# Patient Record
Sex: Female | Born: 1965
Health system: Southern US, Community
[De-identification: ages and names within clinical notes are randomized; demographics above are authoritative.]

## PROBLEM LIST (undated history)

## (undated) DIAGNOSIS — E559 Vitamin D deficiency, unspecified: Secondary | ICD-10-CM

## (undated) DIAGNOSIS — N809 Endometriosis, unspecified: Secondary | ICD-10-CM

## (undated) DIAGNOSIS — G4730 Sleep apnea, unspecified: Secondary | ICD-10-CM

## (undated) DIAGNOSIS — M549 Dorsalgia, unspecified: Secondary | ICD-10-CM

## (undated) DIAGNOSIS — Z86010 Personal history of colonic polyps: Secondary | ICD-10-CM

## (undated) DIAGNOSIS — K219 Gastro-esophageal reflux disease without esophagitis: Secondary | ICD-10-CM

## (undated) DIAGNOSIS — M7989 Other specified soft tissue disorders: Secondary | ICD-10-CM

## (undated) DIAGNOSIS — G8929 Other chronic pain: Secondary | ICD-10-CM

## (undated) DIAGNOSIS — E785 Hyperlipidemia, unspecified: Secondary | ICD-10-CM

## (undated) DIAGNOSIS — K829 Disease of gallbladder, unspecified: Secondary | ICD-10-CM

## (undated) DIAGNOSIS — G4700 Insomnia, unspecified: Secondary | ICD-10-CM

## (undated) DIAGNOSIS — C801 Malignant (primary) neoplasm, unspecified: Secondary | ICD-10-CM

## (undated) DIAGNOSIS — E669 Obesity, unspecified: Secondary | ICD-10-CM

## (undated) DIAGNOSIS — J309 Allergic rhinitis, unspecified: Secondary | ICD-10-CM

## (undated) DIAGNOSIS — J40 Bronchitis, not specified as acute or chronic: Secondary | ICD-10-CM

## (undated) DIAGNOSIS — M797 Fibromyalgia: Secondary | ICD-10-CM

## (undated) DIAGNOSIS — E039 Hypothyroidism, unspecified: Secondary | ICD-10-CM

## (undated) DIAGNOSIS — E538 Deficiency of other specified B group vitamins: Secondary | ICD-10-CM

## (undated) DIAGNOSIS — M2550 Pain in unspecified joint: Secondary | ICD-10-CM

## (undated) DIAGNOSIS — F32A Depression, unspecified: Secondary | ICD-10-CM

## (undated) DIAGNOSIS — Z800 Family history of malignant neoplasm of digestive organs: Secondary | ICD-10-CM

## (undated) DIAGNOSIS — D649 Anemia, unspecified: Secondary | ICD-10-CM

## (undated) DIAGNOSIS — K589 Irritable bowel syndrome without diarrhea: Secondary | ICD-10-CM

## (undated) DIAGNOSIS — F419 Anxiety disorder, unspecified: Secondary | ICD-10-CM

## (undated) DIAGNOSIS — G2581 Restless legs syndrome: Secondary | ICD-10-CM

## (undated) DIAGNOSIS — M1990 Unspecified osteoarthritis, unspecified site: Secondary | ICD-10-CM

## (undated) DIAGNOSIS — E611 Iron deficiency: Secondary | ICD-10-CM

## (undated) DIAGNOSIS — R112 Nausea with vomiting, unspecified: Secondary | ICD-10-CM

## (undated) DIAGNOSIS — R5382 Chronic fatigue, unspecified: Secondary | ICD-10-CM

## (undated) DIAGNOSIS — R609 Edema, unspecified: Secondary | ICD-10-CM

## (undated) DIAGNOSIS — R51 Headache: Secondary | ICD-10-CM

## (undated) DIAGNOSIS — T7840XA Allergy, unspecified, initial encounter: Secondary | ICD-10-CM

## (undated) DIAGNOSIS — K8020 Calculus of gallbladder without cholecystitis without obstruction: Secondary | ICD-10-CM

## (undated) DIAGNOSIS — R519 Headache, unspecified: Secondary | ICD-10-CM

## (undated) DIAGNOSIS — K5900 Constipation, unspecified: Secondary | ICD-10-CM

## (undated) DIAGNOSIS — F329 Major depressive disorder, single episode, unspecified: Secondary | ICD-10-CM

## (undated) DIAGNOSIS — R0602 Shortness of breath: Secondary | ICD-10-CM

## (undated) DIAGNOSIS — Z9889 Other specified postprocedural states: Secondary | ICD-10-CM

## (undated) HISTORY — DX: Endometriosis, unspecified: N80.9

## (undated) HISTORY — DX: Major depressive disorder, single episode, unspecified: F32.9

## (undated) HISTORY — DX: Gastro-esophageal reflux disease without esophagitis: K21.9

## (undated) HISTORY — DX: Depression, unspecified: F32.A

## (undated) HISTORY — DX: Allergic rhinitis, unspecified: J30.9

## (undated) HISTORY — DX: Iron deficiency: E61.1

## (undated) HISTORY — DX: Edema, unspecified: R60.9

## (undated) HISTORY — PX: PANNICULECTOMY: SUR1001

## (undated) HISTORY — DX: Vitamin D deficiency, unspecified: E55.9

## (undated) HISTORY — DX: Other chronic pain: G89.29

## (undated) HISTORY — DX: Irritable bowel syndrome, unspecified: K58.9

## (undated) HISTORY — DX: Obesity, unspecified: E66.9

## (undated) HISTORY — DX: Personal history of colonic polyps: Z86.010

## (undated) HISTORY — DX: Restless legs syndrome: G25.81

## (undated) HISTORY — DX: Headache: R51

## (undated) HISTORY — DX: Allergy, unspecified, initial encounter: T78.40XA

## (undated) HISTORY — DX: Calculus of gallbladder without cholecystitis without obstruction: K80.20

## (undated) HISTORY — DX: Anxiety disorder, unspecified: F41.9

## (undated) HISTORY — DX: Malignant (primary) neoplasm, unspecified: C80.1

## (undated) HISTORY — DX: Constipation, unspecified: K59.00

## (undated) HISTORY — DX: Dorsalgia, unspecified: M54.9

## (undated) HISTORY — DX: Chronic fatigue, unspecified: R53.82

## (undated) HISTORY — DX: Insomnia, unspecified: G47.00

## (undated) HISTORY — PX: DE QUERVAIN'S RELEASE: SHX1439

## (undated) HISTORY — DX: Deficiency of other specified B group vitamins: E53.8

## (undated) HISTORY — DX: Unspecified osteoarthritis, unspecified site: M19.90

## (undated) HISTORY — DX: Pain in unspecified joint: M25.50

## (undated) HISTORY — PX: TRIGGER FINGER RELEASE: SHX641

## (undated) HISTORY — DX: Bronchitis, not specified as acute or chronic: J40

## (undated) HISTORY — DX: Shortness of breath: R06.02

## (undated) HISTORY — DX: Disease of gallbladder, unspecified: K82.9

## (undated) HISTORY — DX: Hyperlipidemia, unspecified: E78.5

## (undated) HISTORY — DX: Family history of malignant neoplasm of digestive organs: Z80.0

## (undated) HISTORY — DX: Headache, unspecified: R51.9

## (undated) HISTORY — PX: LUMBAR LAMINECTOMY: SHX95

## (undated) HISTORY — DX: Sleep apnea, unspecified: G47.30

## (undated) HISTORY — DX: Other specified soft tissue disorders: M79.89

## (undated) HISTORY — DX: Fibromyalgia: M79.7

## (undated) HISTORY — PX: POLYPECTOMY: SHX149

## (undated) HISTORY — DX: Hypothyroidism, unspecified: E03.9

## (undated) HISTORY — DX: Anemia, unspecified: D64.9

---
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
## 1991-12-03 HISTORY — PX: APPENDECTOMY: SHX54

## 1997-12-02 HISTORY — PX: CHOLECYSTECTOMY: SHX55

## 1998-02-07 ENCOUNTER — Ambulatory Visit (HOSPITAL_COMMUNITY): Admission: RE | Admit: 1998-02-07 | Discharge: 1998-02-07 | Payer: Self-pay | Admitting: Obstetrics and Gynecology

## 1998-06-08 ENCOUNTER — Observation Stay (HOSPITAL_COMMUNITY): Admission: RE | Admit: 1998-06-08 | Discharge: 1998-06-09 | Payer: Self-pay | Admitting: Surgery

## 1999-06-14 ENCOUNTER — Other Ambulatory Visit: Admission: RE | Admit: 1999-06-14 | Discharge: 1999-06-14 | Payer: Self-pay | Admitting: Obstetrics and Gynecology

## 1999-12-03 HISTORY — PX: REPAIR ANKLE LIGAMENT: SUR1187

## 2000-06-17 ENCOUNTER — Other Ambulatory Visit: Admission: RE | Admit: 2000-06-17 | Discharge: 2000-06-17 | Payer: Self-pay | Admitting: Obstetrics and Gynecology

## 2000-12-10 ENCOUNTER — Encounter: Payer: Self-pay | Admitting: Orthopedic Surgery

## 2000-12-10 ENCOUNTER — Ambulatory Visit (HOSPITAL_COMMUNITY): Admission: RE | Admit: 2000-12-10 | Discharge: 2000-12-10 | Payer: Self-pay | Admitting: Orthopedic Surgery

## 2001-02-03 ENCOUNTER — Ambulatory Visit (HOSPITAL_BASED_OUTPATIENT_CLINIC_OR_DEPARTMENT_OTHER): Admission: RE | Admit: 2001-02-03 | Discharge: 2001-02-04 | Payer: Self-pay | Admitting: Orthopedic Surgery

## 2001-02-13 ENCOUNTER — Emergency Department (HOSPITAL_COMMUNITY): Admission: EM | Admit: 2001-02-13 | Discharge: 2001-02-13 | Payer: Self-pay | Admitting: Emergency Medicine

## 2001-06-04 ENCOUNTER — Ambulatory Visit (HOSPITAL_COMMUNITY): Admission: RE | Admit: 2001-06-04 | Discharge: 2001-06-04 | Payer: Self-pay | Admitting: Endocrinology

## 2001-06-04 ENCOUNTER — Encounter: Payer: Self-pay | Admitting: Endocrinology

## 2001-06-05 ENCOUNTER — Encounter: Payer: Self-pay | Admitting: Endocrinology

## 2001-06-05 ENCOUNTER — Ambulatory Visit (HOSPITAL_COMMUNITY): Admission: RE | Admit: 2001-06-05 | Discharge: 2001-06-05 | Payer: Self-pay | Admitting: Endocrinology

## 2001-06-19 ENCOUNTER — Encounter: Admission: RE | Admit: 2001-06-19 | Discharge: 2001-07-01 | Payer: Self-pay | Admitting: Internal Medicine

## 2001-06-30 ENCOUNTER — Encounter: Admission: RE | Admit: 2001-06-30 | Discharge: 2001-07-01 | Payer: Self-pay | Admitting: Neurological Surgery

## 2001-07-02 ENCOUNTER — Encounter: Admission: RE | Admit: 2001-07-02 | Discharge: 2001-09-30 | Payer: Self-pay | Admitting: Neurological Surgery

## 2001-07-04 ENCOUNTER — Other Ambulatory Visit: Admission: RE | Admit: 2001-07-04 | Discharge: 2001-07-04 | Payer: Self-pay | Admitting: Obstetrics and Gynecology

## 2001-07-24 ENCOUNTER — Encounter: Payer: Self-pay | Admitting: Neurological Surgery

## 2001-07-24 ENCOUNTER — Encounter: Admission: RE | Admit: 2001-07-24 | Discharge: 2001-07-24 | Payer: Self-pay | Admitting: Neurological Surgery

## 2001-09-17 ENCOUNTER — Emergency Department (HOSPITAL_COMMUNITY): Admission: EM | Admit: 2001-09-17 | Discharge: 2001-09-17 | Payer: Self-pay | Admitting: Emergency Medicine

## 2001-09-21 ENCOUNTER — Ambulatory Visit (HOSPITAL_COMMUNITY): Admission: RE | Admit: 2001-09-21 | Discharge: 2001-09-21 | Payer: Self-pay | Admitting: Internal Medicine

## 2001-09-21 ENCOUNTER — Encounter: Payer: Self-pay | Admitting: Internal Medicine

## 2001-09-25 ENCOUNTER — Encounter: Payer: Self-pay | Admitting: Neurological Surgery

## 2001-09-25 ENCOUNTER — Ambulatory Visit (HOSPITAL_COMMUNITY): Admission: RE | Admit: 2001-09-25 | Discharge: 2001-09-25 | Payer: Self-pay | Admitting: Neurological Surgery

## 2001-10-02 ENCOUNTER — Ambulatory Visit (HOSPITAL_BASED_OUTPATIENT_CLINIC_OR_DEPARTMENT_OTHER): Admission: RE | Admit: 2001-10-02 | Discharge: 2001-10-02 | Payer: Self-pay | Admitting: Pulmonary Disease

## 2001-10-07 ENCOUNTER — Ambulatory Visit (HOSPITAL_COMMUNITY): Admission: RE | Admit: 2001-10-07 | Discharge: 2001-10-07 | Payer: Self-pay | Admitting: Internal Medicine

## 2001-12-02 HISTORY — PX: ANTERIOR CRUCIATE LIGAMENT REPAIR: SHX115

## 2002-04-12 ENCOUNTER — Emergency Department (HOSPITAL_COMMUNITY): Admission: EM | Admit: 2002-04-12 | Discharge: 2002-04-12 | Payer: Self-pay | Admitting: Emergency Medicine

## 2002-04-12 ENCOUNTER — Encounter: Payer: Self-pay | Admitting: Emergency Medicine

## 2002-09-24 ENCOUNTER — Ambulatory Visit (HOSPITAL_COMMUNITY): Admission: RE | Admit: 2002-09-24 | Discharge: 2002-09-24 | Payer: Self-pay | Admitting: Neurological Surgery

## 2002-09-24 ENCOUNTER — Encounter: Payer: Self-pay | Admitting: Neurological Surgery

## 2002-09-27 ENCOUNTER — Other Ambulatory Visit: Admission: RE | Admit: 2002-09-27 | Discharge: 2002-09-27 | Payer: Self-pay | Admitting: Obstetrics and Gynecology

## 2002-11-02 ENCOUNTER — Ambulatory Visit (HOSPITAL_COMMUNITY): Admission: RE | Admit: 2002-11-02 | Discharge: 2002-11-02 | Payer: Self-pay | Admitting: Orthopedic Surgery

## 2002-11-02 ENCOUNTER — Encounter: Payer: Self-pay | Admitting: Orthopedic Surgery

## 2002-11-11 ENCOUNTER — Ambulatory Visit (HOSPITAL_BASED_OUTPATIENT_CLINIC_OR_DEPARTMENT_OTHER): Admission: RE | Admit: 2002-11-11 | Discharge: 2002-11-11 | Payer: Self-pay | Admitting: Orthopedic Surgery

## 2002-11-29 ENCOUNTER — Encounter: Admission: RE | Admit: 2002-11-29 | Discharge: 2003-02-27 | Payer: Self-pay | Admitting: Orthopedic Surgery

## 2003-01-31 ENCOUNTER — Other Ambulatory Visit (HOSPITAL_COMMUNITY): Admission: RE | Admit: 2003-01-31 | Discharge: 2003-02-14 | Payer: Self-pay | Admitting: Psychiatry

## 2003-10-25 ENCOUNTER — Other Ambulatory Visit: Admission: RE | Admit: 2003-10-25 | Discharge: 2003-10-25 | Payer: Self-pay | Admitting: Obstetrics and Gynecology

## 2004-01-17 ENCOUNTER — Ambulatory Visit (HOSPITAL_COMMUNITY): Admission: RE | Admit: 2004-01-17 | Discharge: 2004-01-17 | Payer: Self-pay | Admitting: Surgery

## 2004-01-17 ENCOUNTER — Encounter: Admission: RE | Admit: 2004-01-17 | Discharge: 2004-04-16 | Payer: Self-pay | Admitting: Surgery

## 2004-01-17 ENCOUNTER — Encounter: Admission: RE | Admit: 2004-01-17 | Discharge: 2004-01-17 | Payer: Self-pay | Admitting: Surgery

## 2004-01-23 ENCOUNTER — Encounter: Admission: RE | Admit: 2004-01-23 | Discharge: 2004-01-23 | Payer: Self-pay | Admitting: Surgery

## 2004-04-01 HISTORY — PX: ROUX-EN-Y GASTRIC BYPASS: SHX1104

## 2004-04-03 ENCOUNTER — Inpatient Hospital Stay (HOSPITAL_COMMUNITY): Admission: RE | Admit: 2004-04-03 | Discharge: 2004-04-10 | Payer: Self-pay | Admitting: Surgery

## 2004-05-23 ENCOUNTER — Encounter: Admission: RE | Admit: 2004-05-23 | Discharge: 2004-08-21 | Payer: Self-pay | Admitting: Surgery

## 2004-09-07 ENCOUNTER — Encounter: Admission: RE | Admit: 2004-09-07 | Discharge: 2004-09-07 | Payer: Self-pay | Admitting: Obstetrics & Gynecology

## 2004-10-30 ENCOUNTER — Encounter: Admission: RE | Admit: 2004-10-30 | Discharge: 2004-10-30 | Payer: Self-pay | Admitting: Surgery

## 2004-12-13 ENCOUNTER — Ambulatory Visit (HOSPITAL_COMMUNITY): Admission: RE | Admit: 2004-12-13 | Discharge: 2004-12-13 | Payer: Self-pay | Admitting: Surgery

## 2005-01-29 ENCOUNTER — Encounter: Admission: RE | Admit: 2005-01-29 | Discharge: 2005-04-29 | Payer: Self-pay | Admitting: Surgery

## 2006-01-10 ENCOUNTER — Ambulatory Visit: Payer: Self-pay | Admitting: Gastroenterology

## 2006-02-06 ENCOUNTER — Ambulatory Visit: Payer: Self-pay | Admitting: Gastroenterology

## 2006-02-12 ENCOUNTER — Ambulatory Visit: Payer: Self-pay | Admitting: Gastroenterology

## 2006-04-01 DIAGNOSIS — Z860100 Personal history of colon polyps, unspecified: Secondary | ICD-10-CM

## 2006-04-01 HISTORY — DX: Personal history of colon polyps, unspecified: Z86.0100

## 2006-04-01 HISTORY — DX: Personal history of colonic polyps: Z86.010

## 2007-12-03 HISTORY — PX: COLONOSCOPY: SHX174

## 2010-12-23 ENCOUNTER — Encounter: Payer: Self-pay | Admitting: Surgery

## 2011-04-24 ENCOUNTER — Encounter: Payer: Self-pay | Admitting: Gastroenterology

## 2011-06-10 ENCOUNTER — Encounter: Payer: Self-pay | Admitting: Gastroenterology

## 2011-06-10 ENCOUNTER — Ambulatory Visit (INDEPENDENT_AMBULATORY_CARE_PROVIDER_SITE_OTHER): Payer: Medicare HMO | Admitting: Gastroenterology

## 2011-06-10 DIAGNOSIS — Z860100 Personal history of colon polyps, unspecified: Secondary | ICD-10-CM | POA: Insufficient documentation

## 2011-06-10 DIAGNOSIS — Z9884 Bariatric surgery status: Secondary | ICD-10-CM

## 2011-06-10 DIAGNOSIS — L0390 Cellulitis, unspecified: Secondary | ICD-10-CM

## 2011-06-10 DIAGNOSIS — Z8739 Personal history of other diseases of the musculoskeletal system and connective tissue: Secondary | ICD-10-CM

## 2011-06-10 DIAGNOSIS — F3341 Major depressive disorder, recurrent, in partial remission: Secondary | ICD-10-CM | POA: Insufficient documentation

## 2011-06-10 DIAGNOSIS — K219 Gastro-esophageal reflux disease without esophagitis: Secondary | ICD-10-CM

## 2011-06-10 DIAGNOSIS — Z800 Family history of malignant neoplasm of digestive organs: Secondary | ICD-10-CM

## 2011-06-10 DIAGNOSIS — E538 Deficiency of other specified B group vitamins: Secondary | ICD-10-CM | POA: Insufficient documentation

## 2011-06-10 DIAGNOSIS — L0291 Cutaneous abscess, unspecified: Secondary | ICD-10-CM

## 2011-06-10 DIAGNOSIS — IMO0002 Reserved for concepts with insufficient information to code with codable children: Secondary | ICD-10-CM

## 2011-06-10 DIAGNOSIS — E669 Obesity, unspecified: Secondary | ICD-10-CM

## 2011-06-10 MED ORDER — DEXLANSOPRAZOLE 60 MG PO CPDR: 60.0000 mg | DELAYED_RELEASE_CAPSULE | Freq: Every day | ORAL | Status: AC

## 2011-06-10 MED ORDER — CEFUROXIME AXETIL 500 MG PO TABS: 500.0000 mg | ORAL_TABLET | Freq: Two times a day (BID) | ORAL | Status: AC

## 2011-06-10 MED ORDER — PEG-KCL-NACL-NASULF-NA ASC-C 100 G PO SOLR: 1.0000 | Freq: Once | ORAL | Status: DC

## 2011-06-10 NOTE — Patient Instructions (Addendum)
Your procedure has been scheduled for 06/11/2011, please follow the seperate instructions.  Your prescription(s) have been sent to you pharmacy.  We have changed your reflux medication to Dexilant per our computer this is the least expensive medication on your insurance.

## 2011-06-10 NOTE — Progress Notes (Signed)
This is a 45 year old Caucasian female status post gastric bypass with Roux-en-Y biliary diversion. She initially lost 60 pounds in weight but has gained all of her weight back since 2005. She suffers from chronic GERD and is on daily PPI therapy. She recently fell and traumatized her back and right leg. She has a strong family history of colon cancer in her mother at age 20 and several aunts and uncles. Last colonoscopy and endoscopy was 5 years ago. Has chronic depression, fibromyalgia, and is chronically disabled. He is on multiple psychotropic medications, when necessary hydrocodone, B12 shots, and a variety of multivitamins. She recently has had pain and edema in her right leg after a traumatic injury, but denies any cardiopulmonary or systemic complaints.  Current Medications, Allergies, Past Medical History, Past Surgical History, Family History and Social History were reviewed in [employer] record.  Pertinent Review of Systems Negative.. she relates her depression is under control as is her fibromyalgia. [facility] and apparently had negative chest x-ray and spine films.   Physical Exam: Moderately obese female appears stated age in no acute distress. I cannot appreciate stigmata of chronic liver disease. There is no thyromegaly or lymphadenopathy noted. Chest is clear cardiac exam is unremarkable without murmurs gallops or rubs. She is in a regular rhythm. There is no organomegaly, abdominal masses or localized tenderness. Bowel sounds are normal. Peripheral extremities show edema and swelling of her right leg with superficial cellulitis and abrasions associated erythema/ superficial pustules. There is no calf tenderness or Homan;s sign. Mental status is normal there are no gross focal neurological deficits.   Assessment and Plan: She appears to have superficial cellulitis associated with a recent leg trauma. I placed her on Ceftin  500 mg twice a day for one week with Florstar probiotic therapy. Colonoscopy has been scheduled her convenience. She is to continue PPI therapy, multivitamins, and B12 shots as per primary care. I did think she is a good candidate for propofol sedation with her multiple psychotropic medications. She relates she has had recent lab data performed with various metabolic studies, all of which have been normal.      Please copy her primary care physician, referring physician, and pertinent subspecialists. Encounter Diagnoses  Name Primary?  . Family history of malignant neoplasm of gastrointestinal tract   . Personal history of colonic polyps   . Esophageal reflux   . Cellulitis

## 2011-06-11 ENCOUNTER — Ambulatory Visit (AMBULATORY_SURGERY_CENTER): Payer: Medicare HMO | Admitting: Gastroenterology

## 2011-06-11 ENCOUNTER — Encounter: Payer: Self-pay | Admitting: Gastroenterology

## 2011-06-11 VITALS — BP 113/66 | HR 75 | Temp 98.7°F | Resp 15 | Ht 69.0 in | Wt 297.0 lb

## 2011-06-11 DIAGNOSIS — Z800 Family history of malignant neoplasm of digestive organs: Secondary | ICD-10-CM

## 2011-06-11 DIAGNOSIS — D126 Benign neoplasm of colon, unspecified: Secondary | ICD-10-CM

## 2011-06-11 DIAGNOSIS — Z1211 Encounter for screening for malignant neoplasm of colon: Secondary | ICD-10-CM

## 2011-06-11 MED ORDER — SODIUM CHLORIDE 0.9 % IV SOLN: 500.0000 mL | INTRAVENOUS | Status: DC

## 2011-06-11 NOTE — Progress Notes (Signed)
Pt with significant bruise on back and right lower leg. States she [place] does [place] tolerate blanket touching. Procedure room and recovery room nurses made aware.

## 2011-06-11 NOTE — Patient Instructions (Signed)
Read the handouts given to you by your recovery room nurse.   You may resume all of your routine medications today.  Dr. Jarold Motto has added an antibiotic to heal your [place]   Please, take all of the medication.   Let your wounds air dry per Dr. Jarold Motto.   Your biopsy results from your polyps will be mailed to you within two weeks.  If you have any questions or concerns, please call us at [phone]. Thank-you.

## 2011-06-12 ENCOUNTER — Telehealth: Payer: Self-pay

## 2011-06-12 NOTE — Telephone Encounter (Signed)

## 2011-06-13 ENCOUNTER — Telehealth: Payer: Self-pay | Admitting: Gastroenterology

## 2011-06-13 NOTE — Telephone Encounter (Signed)
Pt aware of Dr. Patterson's recommendations. 

## 2011-06-13 NOTE — Telephone Encounter (Signed)
Spoke with pt and she is aware of Dr. Norval Gable recommendation to go to the ER to have legs evaluated. See previous phone note.

## 2011-06-13 NOTE — Telephone Encounter (Signed)
SHOULD PROBABLY GO TO ER.Marland KitchenMarland KitchenMarland Kitchen

## 2011-06-13 NOTE — Telephone Encounter (Signed)
Pt states she was seen here Monday and was given Ceftin for cellulitis in her legs. She reports that she now has no feeling in the dark spots on her legs, she states her legs are very tender and there are dark blisters present. Her PCP states that she should be on high dose Cipro or Augmentin and needs a referral to a Careers adviser. Pt is calling wanting to get Dr. Norval Gable recommendation. Dr. Jarold Motto please advise.

## 2011-06-17 ENCOUNTER — Encounter: Payer: Self-pay | Admitting: Gastroenterology

## 2011-09-24 ENCOUNTER — Telehealth: Payer: Self-pay | Admitting: Gastroenterology

## 2011-09-25 NOTE — Telephone Encounter (Signed)
Pt last seen 06/10/11 with hx of Post Gastric Bypass, Chronic GERD, Chronic Depression, Fibromyalgia, chronically Disabled. On this visit pt was dx with Cellulitis in her extremity and she was given Ceftin and scheduled for a COLON the next day. Colon revealed polyps identified as tubular adenomas neg. for hi grade dysplasia or malignancy and another that was hyperplastic. The next day on 06/13/11, pt called to report her legs had no feeling and she had dark spots and blisters on her legs. Per Dr Jarold Motto, she was instructed to go to the ER. I have not other info on pt; she lives in [place], so I assume she went to the ER there. On 09/24/11, pt called to c/o a bad case of IBS per the report. LMOM for pt to call back.  Pt reports she had a bout of constipation last week and since Monday , 09/23/11, and since then she has had loose, watery stools. This is a new problem for her; she denies antibiotic use recently. She called her PCP who ordered a CBC, BMET and she will submit a stool sample, but she doesn't know for what. Pt advised to use Imodium after her stool is collected and call us with the stools results. Pt stated understanding.

## 2011-10-02 ENCOUNTER — Telehealth: Payer: Self-pay | Admitting: Gastroenterology

## 2011-10-02 NOTE — Telephone Encounter (Signed)
Pt reports she has a positive stool culture at Dr [facility] in [place]. Called her ofc, [phone] and they will fax the ofc note and culture result when she signs off on it; she is out of the ofc today. Informed pt of this and she stated she was placed on a Zpack for 3 days.

## 2011-10-02 NOTE — Telephone Encounter (Signed)
Dr Jarold Motto, I'm putting the stool culture report for Campylobacter Species in your folder in IN BOX. Pt reports Dr Sudie Bailey gave her 3 days of Azithromax. Please advise.

## 2012-07-09 DIAGNOSIS — M549 Dorsalgia, unspecified: Secondary | ICD-10-CM | POA: Insufficient documentation

## 2012-08-20 ENCOUNTER — Encounter: Payer: Self-pay | Admitting: Gastroenterology

## 2013-02-22 DIAGNOSIS — M7918 Myalgia, other site: Secondary | ICD-10-CM | POA: Insufficient documentation

## 2015-01-18 DIAGNOSIS — M47814 Spondylosis without myelopathy or radiculopathy, thoracic region: Secondary | ICD-10-CM | POA: Insufficient documentation

## 2015-01-18 DIAGNOSIS — M47899 Other spondylosis, site unspecified: Secondary | ICD-10-CM | POA: Insufficient documentation

## 2015-01-18 DIAGNOSIS — M47819 Spondylosis without myelopathy or radiculopathy, site unspecified: Secondary | ICD-10-CM

## 2015-01-18 DIAGNOSIS — M5136 Other intervertebral disc degeneration, lumbar region: Secondary | ICD-10-CM | POA: Insufficient documentation

## 2015-04-24 ENCOUNTER — Encounter: Payer: Self-pay | Admitting: Gastroenterology

## 2015-04-28 ENCOUNTER — Ambulatory Visit: Payer: Medicare HMO | Admitting: Physician Assistant

## 2015-05-09 ENCOUNTER — Encounter: Payer: Self-pay | Admitting: Physician Assistant

## 2015-05-09 ENCOUNTER — Other Ambulatory Visit (INDEPENDENT_AMBULATORY_CARE_PROVIDER_SITE_OTHER): Payer: PPO

## 2015-05-09 ENCOUNTER — Ambulatory Visit (INDEPENDENT_AMBULATORY_CARE_PROVIDER_SITE_OTHER): Payer: PPO | Admitting: Physician Assistant

## 2015-05-09 VITALS — BP 112/76 | HR 84 | Ht 68.0 in | Wt 252.2 lb

## 2015-05-09 DIAGNOSIS — R195 Other fecal abnormalities: Secondary | ICD-10-CM

## 2015-05-09 DIAGNOSIS — D500 Iron deficiency anemia secondary to blood loss (chronic): Secondary | ICD-10-CM

## 2015-05-09 DIAGNOSIS — Z86010 Personal history of colonic polyps: Secondary | ICD-10-CM

## 2015-05-09 DIAGNOSIS — E538 Deficiency of other specified B group vitamins: Secondary | ICD-10-CM

## 2015-05-09 LAB — CBC WITH DIFFERENTIAL/PLATELET
Basophils Absolute: 0 10*3/uL (ref 0.0–0.1)
Basophils Relative: 0.7 % (ref 0.0–3.0)
Eosinophils Absolute: 0.1 10*3/uL (ref 0.0–0.7)
Eosinophils Relative: 1.2 % (ref 0.0–5.0)
HCT: 36.7 % (ref 36.0–46.0)
Hemoglobin: 11.9 g/dL — ABNORMAL LOW (ref 12.0–15.0)
Lymphocytes Relative: 42.8 % (ref 12.0–46.0)
Lymphs Abs: 2.8 10*3/uL (ref 0.7–4.0)
MCHC: 32.5 g/dL (ref 30.0–36.0)
MCV: 86.6 fl (ref 78.0–100.0)
MONO ABS: 0.5 10*3/uL (ref 0.1–1.0)
MONOS PCT: 7.4 % (ref 3.0–12.0)
Neutro Abs: 3.2 10*3/uL (ref 1.4–7.7)
Neutrophils Relative %: 47.9 % (ref 43.0–77.0)
Platelets: 227 10*3/uL (ref 150.0–400.0)
RBC: 4.24 Mil/uL (ref 3.87–5.11)
RDW: 14.5 % (ref 11.5–15.5)
WBC: 6.6 10*3/uL (ref 4.0–10.5)

## 2015-05-09 LAB — IBC PANEL
IRON: 45 ug/dL (ref 42–145)
Saturation Ratios: 8.4 % — ABNORMAL LOW (ref 20.0–50.0)
TRANSFERRIN: 383 mg/dL — AB (ref 212.0–360.0)

## 2015-05-09 LAB — FERRITIN: FERRITIN: 3 ng/mL — AB (ref 10.0–291.0)

## 2015-05-09 MED ORDER — MOVIPREP 100 G PO SOLR: 1.0000 | ORAL | Status: DC

## 2015-05-09 NOTE — Progress Notes (Signed)
i agree with the above note, plan 

## 2015-05-09 NOTE — Patient Instructions (Signed)
Please go to the basement level to have your labs drawn.  We sent a prescription for the colonoscopy prep to [facility].  You have been scheduled for an endoscopy and colonoscopy. Please follow the written instructions given to you at your visit today. Please pick up your prep supplies at the pharmacy within the next 1-3 days. If you use inhalers (even only as needed), please bring them with you on the day of your procedure. Your physician has requested that you go to www.startemmi.com and enter the access code given to you at your visit today. This web site gives a general overview about your procedure. However, you should still follow specific instructions given to you by our office regarding your preparation for the procedure.

## 2015-05-09 NOTE — Progress Notes (Signed)
Patient ID: Paige [name], female   DOB: 17-Nov-1966, 49 y.o.   MRN: [id number]   Subjective:    Patient ID: Paige [name], female    DOB: 02-06-1966, 49 y.o.   MRN: [id number]  HPI Paige [name] is a very nice 49 year old white female known to Dr. Sharlett Iles from colonoscopy done in 2012. This was done for family history of colon cancer in her mother. She was found to have multiple small polyps in the left colon which were removed. One was a tubular adenoma and 1 hyperplastic polyp. She was to have 5 year interval follow-up. She is referred today by Dr. Baird Cancer for a new anemia and Hemoccult-positive stool. She has no prior history of anemia that she is aware of. She has not noticed any melena or hematochezia. She has noted some mid abdominal discomfort off and on over the past several months which she describes as a crampy bloated-type feeling. She says she has had some problems with bloating ever since she had gastric bypass surgery in 2005 which was a Roux-en-Y. No nausea or vomiting. She has had significant problems with constipation over the past 3-4 months as well and has been using lactulose when necessary. She says she tried MiraLAX but did not find it beneficial initially. She also has history of chronic GERD currently is on Prilosec every morning and ranitidine at bedtime and says this regimen seems to work fairly well. Other medical problems include fibromyalgia, obesity, and major depression. She relates that her mother was diagnosed with colon cancer she believes in her 31s she also hasn't answered and uncles on the maternal side with colon cancers. Labs from 04/24/2015 hemoglobin 10.8 hematocrit of 34.2 MCV of 87 iron studies were not done  Review of Systems Pertinent positive and negative review of systems were noted in the above HPI section.  All other review of systems was otherwise negative.  Outpatient Encounter Prescriptions as of 05/09/2015  Medication Sig  . ARIPiprazole  (ABILIFY) 15 MG tablet Take 7.5 mg by mouth at bedtime.  . baclofen (LIORESAL) 10 MG tablet Take 10 mg by mouth at bedtime.  . Biotin 5000 MCG TABS Take 1 tablet by mouth every morning.  Marland Kitchen buPROPion (WELLBUTRIN SR) 150 MG 12 hr tablet Take 150 mg by mouth 2 (two) times daily.  . clonazePAM (KLONOPIN) 1 MG tablet Take 1-2 mg by mouth at bedtime as needed.   Marland Kitchen DHEA 25 MG CAPS Take 1 capsule by mouth every morning.  . DULoxetine (CYMBALTA) 60 MG capsule Take 60 mg by mouth 2 (two) times daily.   . furosemide (LASIX) 40 MG tablet Take 40 mg by mouth as needed for edema.  Marland Kitchen HYDROcodone-acetaminophen (NORCO) 10-325 MG per tablet Take 1 tablet by mouth every 8 (eight) hours as needed.  [name] 1/20 1-20 MG-MCG per tablet Take 1 tablet by mouth daily.   Marland Kitchen lactulose (CHRONULAC) 10 GM/15ML solution Take 10 g by mouth daily.  Marland Kitchen levothyroxine (SYNTHROID, LEVOTHROID) 88 MCG tablet Take 88 mcg by mouth daily before breakfast.  . Magnesium Cl-Calcium Carbonate (SLOW-MAG PO) Take 2 tablets by mouth at bedtime.  . montelukast (SINGULAIR) 10 MG tablet Take 10 mg by mouth at bedtime.  Marland Kitchen omeprazole (PRILOSEC) 40 MG capsule Take 40 mg by mouth daily.  . ondansetron (ZOFRAN-ODT) 4 MG disintegrating tablet Take 4 mg by mouth as needed.   . promethazine (PHENERGAN) 25 MG tablet Take 25 mg by mouth as needed. Migraines  . ranitidine (ZANTAC) 300 MG  capsule Take 300 mg by mouth every evening.  . traZODone (DESYREL) 150 MG tablet Take 225 mg by mouth at bedtime.   Marland Kitchen MOVIPREP 100 G SOLR Take 1 kit (200 g total) by mouth as directed.  . [DISCONTINUED] ARIPiprazole (ABILIFY) 2 MG tablet Take 2 mg by mouth daily.    . [DISCONTINUED] aspirin 81 MG tablet Take 81 mg by mouth daily.    . [DISCONTINUED] B Complex-Biotin-FA (HM VITAMIN B50 COMPLEX PO) Take 1 capsule by mouth daily.    . [DISCONTINUED] Cholecalciferol (VITAMIN D-3) 5000 UNITS TABS Take 1 tablet by mouth daily.    . [DISCONTINUED] CYANOCOBALAMIN IJ Inject  1,000 mcg as directed every 21 ( twenty-one) days.    . [DISCONTINUED] HYDROcodone-acetaminophen (NORCO) 5-325 MG per tablet   . [DISCONTINUED] indomethacin (INDOCIN) 50 MG capsule   . [DISCONTINUED] levothyroxine (SYNTHROID, LEVOTHROID) 50 MCG tablet   . [DISCONTINUED] Magnesium Malate POWD by Does not apply route.    . [DISCONTINUED] Omega-3 Fatty Acids (OMEGA 3 PO) Take 1,000 mg by mouth 2 (two) times daily.    . [DISCONTINUED] pantoprazole (PROTONIX) 40 MG tablet    Facility-Administered Encounter Medications as of 05/09/2015  Medication  . 0.9 %  sodium chloride infusion   Allergies  Allergen Reactions  . Flexeril [Cyclobenzaprine Hcl]   . Imitrex [Sumatriptan Base]   . Morphine And Related   . Percocet [Oxycodone-Acetaminophen]    Patient Active Problem List   Diagnosis Date Noted  . Family history of malignant neoplasm of gastrointestinal tract 06/10/2011  . Personal history of colonic polyps 06/10/2011  . Esophageal reflux 06/10/2011  . Cellulitis 06/10/2011  . Status post gastric bypass for obesity 06/10/2011  . B12 nutritional deficiency 06/10/2011  . Obesity 06/10/2011  . Personal history of fibromyalgia 06/10/2011  . Depression, major, recurrent, in partial remission 06/10/2011   History   Social History  . Marital Status: Single    Spouse Name: N/A  . Number of Children: 0  . Years of Education: N/A   Occupational History  . RN/disabled    Social History Main Topics  . Smoking status: Never Smoker   . Smokeless tobacco: Never Used  . Alcohol Use: 0.0 oz/week    0 Standard drinks or equivalent per week     Comment: rarely  . Drug Use: No  . Sexual Activity: Not on file   Other Topics Concern  . Not on file   Social History Narrative    Ms. Rylander's family history includes Colon cancer in her mother; Colon polyps in her sister; Diabetes in her father and sister; Heart disease in her father; Leukemia in her father; Lymphoma in her father; Skin cancer  in her father.      Objective:    Filed Vitals:   05/09/15 0934  BP: 112/76  Pulse: 84    Physical Exam   well-developed white female in no acute distress, pleasant blood pressure 112/76 pulse 84 height 5 foot 8 weight 252, BMI 38.3. HEENT; nontraumatic normocephalic EOMI PERRLA sclera anicteric, Supple ;no JVD, Cardiovascular; regular rate and rhythm with S1-S2 no murmur or gallop, Pulmonary ;clear bilaterally, Abdomen; obese soft bowel sounds are present she has some mild tenderness in the right lower quadrant with slight fullness no definite mass or hepatosplenomegaly, Rectal ;exam not done recently documented Hemoccult-positive, extremities no clubbing cyanosis or edema skin warm and dry, Psych; mood and affect normal and appropriate       Assessment & Plan:   #1 49 yo  female with  New anemia, and heme positive stool with c/o intermittent mid abdominal discomfort. Etiology not clear, r/o occult colon lesion , gastritis , anastomotic ulceration #2 s/p gastric bypass 2005- Roux -en -y #3 hx of adenomatous polyps 2012 #4 family hx of Colon cancer- mother #5 Obesity #6Fibromyalgia #7 chronic GERD  Plan; Check repeat cbc, Fe  Studies  Schedule for Colonoscopy and EGD with Dr.Jacobs- procedures discussed in detail with pt and she is agreeable to proceed  Continue Omeprazole 40 mg po qam and Zantac 150 qpm   Nicolus Ose S Dickie Labarre PA-C 05/09/2015   Cc: Ernestene Kiel, MD

## 2015-05-10 MED ORDER — POLYSACCHARIDE IRON COMPLEX 150 MG PO CAPS: 150.0000 mg | ORAL_CAPSULE | Freq: Every day | ORAL | Status: DC

## 2015-05-24 ENCOUNTER — Encounter: Payer: Self-pay | Admitting: Gastroenterology

## 2015-05-24 ENCOUNTER — Ambulatory Visit (AMBULATORY_SURGERY_CENTER): Payer: PPO | Admitting: Gastroenterology

## 2015-05-24 VITALS — BP 109/66 | HR 70 | Temp 98.1°F | Resp 14 | Ht 68.0 in | Wt 252.0 lb

## 2015-05-24 DIAGNOSIS — K2990 Gastroduodenitis, unspecified, without bleeding: Secondary | ICD-10-CM

## 2015-05-24 DIAGNOSIS — Z800 Family history of malignant neoplasm of digestive organs: Secondary | ICD-10-CM | POA: Diagnosis not present

## 2015-05-24 DIAGNOSIS — K2950 Unspecified chronic gastritis without bleeding: Secondary | ICD-10-CM | POA: Diagnosis not present

## 2015-05-24 DIAGNOSIS — K2970 Gastritis, unspecified, without bleeding: Secondary | ICD-10-CM

## 2015-05-24 DIAGNOSIS — D500 Iron deficiency anemia secondary to blood loss (chronic): Secondary | ICD-10-CM | POA: Diagnosis not present

## 2015-05-24 DIAGNOSIS — R195 Other fecal abnormalities: Secondary | ICD-10-CM

## 2015-05-24 DIAGNOSIS — Z86010 Personal history of colonic polyps: Secondary | ICD-10-CM | POA: Diagnosis not present

## 2015-05-24 MED ORDER — SODIUM CHLORIDE 0.9 % IV SOLN: 500.0000 mL | INTRAVENOUS | Status: DC

## 2015-05-24 NOTE — Patient Instructions (Signed)

## 2015-05-24 NOTE — Progress Notes (Signed)
Called to room to assist during endoscopic procedure.  Patient ID and intended procedure confirmed with present staff. Received instructions for my participation in the procedure from the performing physician.  

## 2015-05-24 NOTE — Progress Notes (Signed)
No problems noted in the recovery room. maw 

## 2015-05-24 NOTE — Progress Notes (Signed)
Report to PACU, RN, vss, BBS= Clear.  

## 2015-05-24 NOTE — Op Note (Signed)
[facility]  [address]. [place], [address]   ENDOSCOPY PROCEDURE REPORT  PATIENT: Paige [name], Paige [name]  MR#: [id number] BIRTHDATE: 1966-02-05 , 48  yrs. old GENDER: female ENDOSCOPIST: Milus Banister, MD PROCEDURE DATE:  05/24/2015 PROCEDURE:  EGD w/ biopsy ASA CLASS:     Class III INDICATIONS:  anemia, FOBT + stool, h/o roux en y gastric bypass about 10 years ago. MEDICATIONS: Monitored anesthesia care and Propofol 150 mg IV TOPICAL ANESTHETIC: none  DESCRIPTION OF PROCEDURE: After the risks benefits and alternatives of the procedure were thoroughly explained, informed consent was obtained.  The LB [id number] endoscope was introduced through the mouth and advanced to the second portion of the duodenum , Without limitations.  The instrument was slowly withdrawn as the mucosa was fully examined.  Normal appearing gastric remnant (s/p Roux en Y gastric bypass several years ago) except for mild gastritis.  The was biopsied and sent to pathology.  The afferent and efferent limbs were both normal appearing.  Retroflexed views revealed no abnormalities. The scope was then withdrawn from the patient and the procedure completed.  COMPLICATIONS: There were no immediate complications.  ENDOSCOPIC IMPRESSION: Normal appearing gastric remnant (s/p Roux en Y gastric bypass several years ago) except for mild gastritis.  The was biopsied and sent to pathology.  The afferent and efferent limbs were both normal appearing  RECOMMENDATIONS: IF biopsies show H.  pylori, you will be started on appropriate antibiotics.  You should have repeat labs (cbc) in 3 months (my office will arrange for this).  eSigned:  Milus Banister, MD 05/24/2015 10:52 AM    CC: Dr. Laqueta Due

## 2015-05-24 NOTE — Op Note (Signed)
[place] [facility]  [address]. [place], [address]   COLONOSCOPY PROCEDURE REPORT  PATIENT: Paige [name], Paige [name]  MR#: [id number] BIRTHDATE: [birth year] , 13  yrs. old GENDER: female ENDOSCOPIST: Milus Banister, MD REFERRED DH:RCBULAGT Prochnau, M.D. PROCEDURE DATE:  05/24/2015 PROCEDURE:   Colonoscopy, diagnostic First Screening Colonoscopy - Avg.  risk and is 50 yrs.  old or older - No.  Prior Negative Screening - Now for repeat screening. N/A  History of Adenoma - Now for follow-up colonoscopy & has been > or = to 3 yrs.  Yes hx of adenoma.  Has been 3 or more years since last colonoscopy.  high risk ASA CLASS:   Class II INDICATIONS:anemia, FH of colon cancer (mother), personal history of adenomatous polyps, FOBT + stool. MEDICATIONS: Monitored anesthesia care and Propofol 350 mg IV  DESCRIPTION OF PROCEDURE:   After the risks benefits and alternatives of the procedure were thoroughly explained, informed consent was obtained.  The digital rectal exam revealed no abnormalities of the rectum.   The LB [id number]  endoscope was introduced through the anus and advanced to the cecum, which was identified by both the appendix and ileocecal valve. No adverse events experienced.   The quality of the prep was adequate  The instrument was then slowly withdrawn as the colon was fully examined. Estimated blood loss is zero unless otherwise noted in this procedure report.    COLON FINDINGS: A normal appearing cecum, ileocecal valve, and appendiceal orifice were identified.  The ascending, transverse, descending, sigmoid colon, and rectum appeared unremarkable. Retroflexed views revealed no abnormalities. The time to cecum = 7.8 Withdrawal time = 10.1   The scope was withdrawn and the procedure completed. COMPLICATIONS: There were no immediate complications.  ENDOSCOPIC IMPRESSION: Normal colonoscopy No polyps or cancers  RECOMMENDATIONS: Given your significant  family history of colon cancer, you should have a repeat colonoscopy in 5 years  eSigned:  Milus Banister, MD 05/24/2015 10:43 AM

## 2015-05-25 ENCOUNTER — Telehealth: Payer: Self-pay | Admitting: *Deleted

## 2015-05-25 NOTE — Telephone Encounter (Signed)
  Follow up Call-  Call back number 05/24/2015  Post procedure Call Back phone  # [phone]  Permission to leave phone message Yes     Patient questions:  Do you have a fever, pain , or abdominal swelling? No. Pain Score  0 *  Have you tolerated food without any problems? Yes.    Have you been able to return to your normal activities? Yes.    Do you have any questions about your discharge instructions: Diet   No. Medications  No. Follow up visit  No.  Do you have questions or concerns about your Care? No.  Actions: * If pain score is 4 or above: No action needed, pain <4. Patient sneezing a lot, discussed effects of 02 on mucous membranes in nose. Referred patient to discharge instructions. Patient still with gas, encouraged to drink warm fluids. Patient stating agreement.

## 2015-06-02 ENCOUNTER — Other Ambulatory Visit: Payer: Self-pay

## 2015-06-02 DIAGNOSIS — D509 Iron deficiency anemia, unspecified: Secondary | ICD-10-CM

## 2015-06-12 ENCOUNTER — Encounter: Payer: PPO | Admitting: Gastroenterology

## 2015-08-17 DIAGNOSIS — Z8669 Personal history of other diseases of the nervous system and sense organs: Secondary | ICD-10-CM | POA: Insufficient documentation

## 2015-08-17 DIAGNOSIS — Z9884 Bariatric surgery status: Secondary | ICD-10-CM | POA: Insufficient documentation

## 2015-08-17 DIAGNOSIS — F419 Anxiety disorder, unspecified: Secondary | ICD-10-CM | POA: Insufficient documentation

## 2015-08-17 DIAGNOSIS — F339 Major depressive disorder, recurrent, unspecified: Secondary | ICD-10-CM | POA: Insufficient documentation

## 2015-08-17 DIAGNOSIS — M797 Fibromyalgia: Secondary | ICD-10-CM | POA: Insufficient documentation

## 2015-08-17 DIAGNOSIS — F4310 Post-traumatic stress disorder, unspecified: Secondary | ICD-10-CM | POA: Insufficient documentation

## 2015-08-17 DIAGNOSIS — R635 Abnormal weight gain: Secondary | ICD-10-CM | POA: Insufficient documentation

## 2015-08-24 DIAGNOSIS — D509 Iron deficiency anemia, unspecified: Secondary | ICD-10-CM | POA: Insufficient documentation

## 2015-08-31 ENCOUNTER — Other Ambulatory Visit (INDEPENDENT_AMBULATORY_CARE_PROVIDER_SITE_OTHER): Payer: PPO

## 2015-08-31 DIAGNOSIS — D509 Iron deficiency anemia, unspecified: Secondary | ICD-10-CM

## 2015-08-31 LAB — CBC WITH DIFFERENTIAL/PLATELET
BASOS ABS: 0.1 10*3/uL (ref 0.0–0.1)
Basophils Relative: 0.7 % (ref 0.0–3.0)
Eosinophils Absolute: 0 10*3/uL (ref 0.0–0.7)
Eosinophils Relative: 0.4 % (ref 0.0–5.0)
HEMATOCRIT: 38.4 % (ref 36.0–46.0)
Hemoglobin: 12.6 g/dL (ref 12.0–15.0)
LYMPHS PCT: 31.7 % (ref 12.0–46.0)
Lymphs Abs: 3 10*3/uL (ref 0.7–4.0)
MCHC: 32.9 g/dL (ref 30.0–36.0)
MCV: 87 fl (ref 78.0–100.0)
MONOS PCT: 5.1 % (ref 3.0–12.0)
Monocytes Absolute: 0.5 10*3/uL (ref 0.1–1.0)
NEUTROS ABS: 5.9 10*3/uL (ref 1.4–7.7)
Neutrophils Relative %: 62.1 % (ref 43.0–77.0)
Platelets: 228 10*3/uL (ref 150.0–400.0)
RBC: 4.41 Mil/uL (ref 3.87–5.11)
RDW: 14.7 % (ref 11.5–15.5)
WBC: 9.5 10*3/uL (ref 4.0–10.5)

## 2015-11-24 DIAGNOSIS — M533 Sacrococcygeal disorders, not elsewhere classified: Secondary | ICD-10-CM | POA: Insufficient documentation

## 2015-12-07 DIAGNOSIS — F332 Major depressive disorder, recurrent severe without psychotic features: Secondary | ICD-10-CM | POA: Diagnosis not present

## 2015-12-07 DIAGNOSIS — Z9884 Bariatric surgery status: Secondary | ICD-10-CM | POA: Diagnosis not present

## 2015-12-07 DIAGNOSIS — F339 Major depressive disorder, recurrent, unspecified: Secondary | ICD-10-CM | POA: Diagnosis not present

## 2015-12-07 DIAGNOSIS — E669 Obesity, unspecified: Secondary | ICD-10-CM | POA: Diagnosis not present

## 2015-12-14 DIAGNOSIS — F332 Major depressive disorder, recurrent severe without psychotic features: Secondary | ICD-10-CM | POA: Diagnosis not present

## 2015-12-21 DIAGNOSIS — Z713 Dietary counseling and surveillance: Secondary | ICD-10-CM | POA: Diagnosis not present

## 2015-12-21 DIAGNOSIS — F332 Major depressive disorder, recurrent severe without psychotic features: Secondary | ICD-10-CM | POA: Diagnosis not present

## 2015-12-21 DIAGNOSIS — E669 Obesity, unspecified: Secondary | ICD-10-CM | POA: Diagnosis not present

## 2015-12-22 DIAGNOSIS — F332 Major depressive disorder, recurrent severe without psychotic features: Secondary | ICD-10-CM | POA: Diagnosis not present

## 2016-01-04 DIAGNOSIS — F332 Major depressive disorder, recurrent severe without psychotic features: Secondary | ICD-10-CM | POA: Diagnosis not present

## 2016-01-04 DIAGNOSIS — H538 Other visual disturbances: Secondary | ICD-10-CM | POA: Diagnosis not present

## 2016-01-04 DIAGNOSIS — H25013 Cortical age-related cataract, bilateral: Secondary | ICD-10-CM | POA: Diagnosis not present

## 2016-01-04 DIAGNOSIS — H04123 Dry eye syndrome of bilateral lacrimal glands: Secondary | ICD-10-CM | POA: Diagnosis not present

## 2016-01-04 DIAGNOSIS — H2513 Age-related nuclear cataract, bilateral: Secondary | ICD-10-CM | POA: Diagnosis not present

## 2016-01-11 DIAGNOSIS — F332 Major depressive disorder, recurrent severe without psychotic features: Secondary | ICD-10-CM | POA: Diagnosis not present

## 2016-01-15 DIAGNOSIS — D2272 Melanocytic nevi of left lower limb, including hip: Secondary | ICD-10-CM | POA: Diagnosis not present

## 2016-01-15 DIAGNOSIS — L821 Other seborrheic keratosis: Secondary | ICD-10-CM | POA: Diagnosis not present

## 2016-01-15 DIAGNOSIS — D2271 Melanocytic nevi of right lower limb, including hip: Secondary | ICD-10-CM | POA: Diagnosis not present

## 2016-01-15 DIAGNOSIS — D485 Neoplasm of uncertain behavior of skin: Secondary | ICD-10-CM | POA: Diagnosis not present

## 2016-01-15 DIAGNOSIS — C4441 Basal cell carcinoma of skin of scalp and neck: Secondary | ICD-10-CM | POA: Diagnosis not present

## 2016-01-15 DIAGNOSIS — D225 Melanocytic nevi of trunk: Secondary | ICD-10-CM | POA: Diagnosis not present

## 2016-01-15 DIAGNOSIS — D2261 Melanocytic nevi of right upper limb, including shoulder: Secondary | ICD-10-CM | POA: Diagnosis not present

## 2016-01-15 DIAGNOSIS — D2262 Melanocytic nevi of left upper limb, including shoulder: Secondary | ICD-10-CM | POA: Diagnosis not present

## 2016-01-15 DIAGNOSIS — L738 Other specified follicular disorders: Secondary | ICD-10-CM | POA: Diagnosis not present

## 2016-01-15 DIAGNOSIS — L814 Other melanin hyperpigmentation: Secondary | ICD-10-CM | POA: Diagnosis not present

## 2016-01-15 DIAGNOSIS — C44319 Basal cell carcinoma of skin of other parts of face: Secondary | ICD-10-CM | POA: Diagnosis not present

## 2016-01-15 DIAGNOSIS — D224 Melanocytic nevi of scalp and neck: Secondary | ICD-10-CM | POA: Diagnosis not present

## 2016-01-18 DIAGNOSIS — H04123 Dry eye syndrome of bilateral lacrimal glands: Secondary | ICD-10-CM | POA: Diagnosis not present

## 2016-01-18 DIAGNOSIS — H538 Other visual disturbances: Secondary | ICD-10-CM | POA: Diagnosis not present

## 2016-01-18 DIAGNOSIS — F332 Major depressive disorder, recurrent severe without psychotic features: Secondary | ICD-10-CM | POA: Diagnosis not present

## 2016-01-18 DIAGNOSIS — H5704 Mydriasis: Secondary | ICD-10-CM | POA: Diagnosis not present

## 2016-01-25 DIAGNOSIS — M797 Fibromyalgia: Secondary | ICD-10-CM | POA: Diagnosis not present

## 2016-01-25 DIAGNOSIS — E669 Obesity, unspecified: Secondary | ICD-10-CM | POA: Diagnosis not present

## 2016-01-25 DIAGNOSIS — F332 Major depressive disorder, recurrent severe without psychotic features: Secondary | ICD-10-CM | POA: Diagnosis not present

## 2016-01-25 DIAGNOSIS — Z9884 Bariatric surgery status: Secondary | ICD-10-CM | POA: Diagnosis not present

## 2016-01-25 DIAGNOSIS — R635 Abnormal weight gain: Secondary | ICD-10-CM | POA: Diagnosis not present

## 2016-02-01 DIAGNOSIS — J101 Influenza due to other identified influenza virus with other respiratory manifestations: Secondary | ICD-10-CM | POA: Diagnosis not present

## 2016-02-01 DIAGNOSIS — R05 Cough: Secondary | ICD-10-CM | POA: Diagnosis not present

## 2016-02-08 DIAGNOSIS — F332 Major depressive disorder, recurrent severe without psychotic features: Secondary | ICD-10-CM | POA: Diagnosis not present

## 2016-02-08 DIAGNOSIS — Z85828 Personal history of other malignant neoplasm of skin: Secondary | ICD-10-CM | POA: Diagnosis not present

## 2016-02-08 DIAGNOSIS — C44319 Basal cell carcinoma of skin of other parts of face: Secondary | ICD-10-CM | POA: Diagnosis not present

## 2016-02-15 DIAGNOSIS — F332 Major depressive disorder, recurrent severe without psychotic features: Secondary | ICD-10-CM | POA: Diagnosis not present

## 2016-02-16 DIAGNOSIS — F332 Major depressive disorder, recurrent severe without psychotic features: Secondary | ICD-10-CM | POA: Diagnosis not present

## 2016-02-22 DIAGNOSIS — F332 Major depressive disorder, recurrent severe without psychotic features: Secondary | ICD-10-CM | POA: Diagnosis not present

## 2016-02-22 DIAGNOSIS — E669 Obesity, unspecified: Secondary | ICD-10-CM | POA: Diagnosis not present

## 2016-02-22 DIAGNOSIS — Z713 Dietary counseling and surveillance: Secondary | ICD-10-CM | POA: Diagnosis not present

## 2016-02-27 DIAGNOSIS — M47814 Spondylosis without myelopathy or radiculopathy, thoracic region: Secondary | ICD-10-CM | POA: Diagnosis not present

## 2016-02-27 DIAGNOSIS — M533 Sacrococcygeal disorders, not elsewhere classified: Secondary | ICD-10-CM | POA: Diagnosis not present

## 2016-02-27 DIAGNOSIS — M5137 Other intervertebral disc degeneration, lumbosacral region: Secondary | ICD-10-CM | POA: Diagnosis not present

## 2016-02-28 DIAGNOSIS — R0602 Shortness of breath: Secondary | ICD-10-CM | POA: Diagnosis not present

## 2016-02-28 DIAGNOSIS — J069 Acute upper respiratory infection, unspecified: Secondary | ICD-10-CM | POA: Diagnosis not present

## 2016-02-28 DIAGNOSIS — H6592 Unspecified nonsuppurative otitis media, left ear: Secondary | ICD-10-CM | POA: Diagnosis not present

## 2016-03-05 DIAGNOSIS — J040 Acute laryngitis: Secondary | ICD-10-CM | POA: Diagnosis not present

## 2016-03-05 DIAGNOSIS — J0100 Acute maxillary sinusitis, unspecified: Secondary | ICD-10-CM | POA: Diagnosis not present

## 2016-03-21 DIAGNOSIS — R635 Abnormal weight gain: Secondary | ICD-10-CM | POA: Diagnosis not present

## 2016-03-21 DIAGNOSIS — F332 Major depressive disorder, recurrent severe without psychotic features: Secondary | ICD-10-CM | POA: Diagnosis not present

## 2016-03-21 DIAGNOSIS — E6609 Other obesity due to excess calories: Secondary | ICD-10-CM | POA: Diagnosis not present

## 2016-03-21 DIAGNOSIS — R05 Cough: Secondary | ICD-10-CM | POA: Diagnosis not present

## 2016-03-21 DIAGNOSIS — Z9884 Bariatric surgery status: Secondary | ICD-10-CM | POA: Diagnosis not present

## 2016-03-25 DIAGNOSIS — R062 Wheezing: Secondary | ICD-10-CM | POA: Diagnosis not present

## 2016-03-25 DIAGNOSIS — H6982 Other specified disorders of Eustachian tube, left ear: Secondary | ICD-10-CM | POA: Diagnosis not present

## 2016-03-25 DIAGNOSIS — R0781 Pleurodynia: Secondary | ICD-10-CM | POA: Diagnosis not present

## 2016-03-25 DIAGNOSIS — J329 Chronic sinusitis, unspecified: Secondary | ICD-10-CM | POA: Diagnosis not present

## 2016-03-25 DIAGNOSIS — J0100 Acute maxillary sinusitis, unspecified: Secondary | ICD-10-CM | POA: Diagnosis not present

## 2016-03-25 DIAGNOSIS — R05 Cough: Secondary | ICD-10-CM | POA: Diagnosis not present

## 2016-03-26 DIAGNOSIS — R062 Wheezing: Secondary | ICD-10-CM | POA: Diagnosis not present

## 2016-04-04 DIAGNOSIS — Z6832 Body mass index (BMI) 32.0-32.9, adult: Secondary | ICD-10-CM | POA: Diagnosis not present

## 2016-04-04 DIAGNOSIS — Z713 Dietary counseling and surveillance: Secondary | ICD-10-CM | POA: Diagnosis not present

## 2016-04-04 DIAGNOSIS — E669 Obesity, unspecified: Secondary | ICD-10-CM | POA: Diagnosis not present

## 2016-04-04 DIAGNOSIS — F332 Major depressive disorder, recurrent severe without psychotic features: Secondary | ICD-10-CM | POA: Diagnosis not present

## 2016-04-15 DIAGNOSIS — K146 Glossodynia: Secondary | ICD-10-CM | POA: Diagnosis not present

## 2016-04-15 DIAGNOSIS — J329 Chronic sinusitis, unspecified: Secondary | ICD-10-CM | POA: Diagnosis not present

## 2016-04-15 DIAGNOSIS — J019 Acute sinusitis, unspecified: Secondary | ICD-10-CM | POA: Diagnosis not present

## 2016-04-18 DIAGNOSIS — F332 Major depressive disorder, recurrent severe without psychotic features: Secondary | ICD-10-CM | POA: Diagnosis not present

## 2016-04-19 DIAGNOSIS — F332 Major depressive disorder, recurrent severe without psychotic features: Secondary | ICD-10-CM | POA: Diagnosis not present

## 2016-05-02 DIAGNOSIS — F332 Major depressive disorder, recurrent severe without psychotic features: Secondary | ICD-10-CM | POA: Diagnosis not present

## 2016-05-10 DIAGNOSIS — J029 Acute pharyngitis, unspecified: Secondary | ICD-10-CM | POA: Diagnosis not present

## 2016-05-10 DIAGNOSIS — J019 Acute sinusitis, unspecified: Secondary | ICD-10-CM | POA: Diagnosis not present

## 2016-05-15 DIAGNOSIS — J329 Chronic sinusitis, unspecified: Secondary | ICD-10-CM | POA: Diagnosis not present

## 2016-05-15 DIAGNOSIS — R51 Headache: Secondary | ICD-10-CM | POA: Diagnosis not present

## 2016-05-15 DIAGNOSIS — K146 Glossodynia: Secondary | ICD-10-CM | POA: Diagnosis not present

## 2016-05-16 DIAGNOSIS — F332 Major depressive disorder, recurrent severe without psychotic features: Secondary | ICD-10-CM | POA: Diagnosis not present

## 2016-05-22 ENCOUNTER — Other Ambulatory Visit: Payer: Self-pay

## 2016-05-22 DIAGNOSIS — M533 Sacrococcygeal disorders, not elsewhere classified: Secondary | ICD-10-CM | POA: Diagnosis not present

## 2016-05-22 DIAGNOSIS — G8929 Other chronic pain: Secondary | ICD-10-CM | POA: Insufficient documentation

## 2016-05-23 ENCOUNTER — Encounter (INDEPENDENT_AMBULATORY_CARE_PROVIDER_SITE_OTHER): Payer: Self-pay

## 2016-05-23 ENCOUNTER — Encounter: Payer: Self-pay | Admitting: Internal Medicine

## 2016-05-23 ENCOUNTER — Ambulatory Visit (INDEPENDENT_AMBULATORY_CARE_PROVIDER_SITE_OTHER): Payer: PPO | Admitting: Internal Medicine

## 2016-05-23 VITALS — BP 112/70 | HR 77 | Ht 69.0 in | Wt 230.0 lb

## 2016-05-23 DIAGNOSIS — R059 Cough, unspecified: Secondary | ICD-10-CM

## 2016-05-23 DIAGNOSIS — R0600 Dyspnea, unspecified: Secondary | ICD-10-CM

## 2016-05-23 DIAGNOSIS — R05 Cough: Secondary | ICD-10-CM | POA: Diagnosis not present

## 2016-05-23 LAB — NITRIC OXIDE: Nitric Oxide: 23

## 2016-05-23 NOTE — Assessment & Plan Note (Signed)
05/23/2016   Walked RA  2 laps @ 185 ft each stopped due to  Sob but did not appear tachypneic and no desat  Most likely this is VCD, rx upper airway focus for now.  Consider speech therapy eval

## 2016-05-23 NOTE — Progress Notes (Signed)
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 Pulmonary Consult    Referred by Dr. Ernestene Kiel. Pt c/o cough and SOB since 01/30/16. Cough is nonprod and occurs day or night with no specific trigger. She states that she gets winded walking from one room to the next "not all the time". She occ has trouble with swallowing.    cough is mostly daytime/ mostly dry and assoc with sensation of choking No better on albuterol neb just makes her nervous  Already on omeprazole 40 mg Take 30-60 min before first meal of the day and rantidine at hs and Loestrin Still has sensation of throat irritation and "drainage" but no excess/ purulent mucus production  No obvious day to day or daytime variability or assoc  cp or chest tightness, subjective wheeze or overt sinus or hb symptoms. No unusual exp hx or h/o childhood pna/ asthma or knowledge of premature birth.  Sleeping ok without nocturnal  or early am exacerbation  of respiratory  c/o's or need for noct saba. Also denies any obvious fluctuation of symptoms with weather or environmental changes or other aggravating or alleviating factors except as outlined above   Current Medications, Allergies, Complete Past Medical History, Past Surgical History, Family History, and Social History were reviewed in [employer] record.  ROS  The following are not active complaints unless  bolded sore throat, dysphagia, dental problems, itching, sneezing,  nasal congestion or excess/ purulent secretions, ear ache,   fever, chills, sweats, unintended wt loss, classically pleuritic or exertional cp, hemoptysis,  orthopnea pnd or leg swelling, presyncope, palpitations, abdominal pain, anorexia, nausea, vomiting, diarrhea  or change in bowel or bladder habits, change in stools or urine, dysuria,hematuria,  rash, arthralgias, visual complaints, headache, numbness, weakness or ataxia or problems with walking or coordination,  change in mood/affect or memory = anxiety and depression           Review of Systems     Objective:   Physical Exam  amb wf nad with extreme voice fatigue   Wt Readings from Last 3 Encounters:  05/23/16 230 lb (104.327 kg)  05/24/15 252 lb (114.306 kg)  05/09/15 252 lb 4 oz (114.42 kg)    Vital signs reviewed   HEENT: nl dentition, turbinates, and oropharynx. Nl external ear canals without cough reflex   NECK :  without JVD/Nodes/TM/ nl carotid upstrokes bilaterally   LUNGS: no acc muscle use,  Nl contour chest which is clear to A and P bilaterally without cough on insp or exp maneuvers - pseudowheeze only    CV:  RRR  no s3 or murmur or increase in P2, no edema   ABD:  soft and nontender with nl inspiratory excursion in the supine position. No bruits or organomegaly, bowel sounds nl  MS:  Nl gait/ ext warm without deformities,  calf tenderness, cyanosis or clubbing No obvious joint restrictions   SKIN: warm and dry without lesions    NEURO:  alert, approp, nl sensorium with  no motor deficits       cxr 03/25/16 reviewed/ nl Sinus CT 05/10/16 nl        Assessment:

## 2016-05-23 NOTE — Patient Instructions (Addendum)
Continue the acid suppression as you are  Consider stopping the Loestrin which makes the reflux worse  Try not to cough or clear your throat if at all possible   GERD (REFLUX)  is an extremely common cause of respiratory symptoms just like yours , many times with no obvious heartburn at all.    It can be treated with medication, but also with lifestyle changes including elevation of the head of your bed (ideally with 6 inch  bed blocks),  Smoking cessation, avoidance of late meals, excessive alcohol, and avoid fatty foods, chocolate, peppermint, colas, red wine, and acidic juices such as orange juice.  NO MINT OR MENTHOL PRODUCTS SO NO COUGH DROPS  USE SUGARLESS CANDY INSTEAD (Jolley ranchers or Stover's or Life Savers) or even ice chips will also do - the key is to swallow to prevent all throat clearing. NO OIL BASED VITAMINS - use powdered substitutes.    If not improved will need to consider trial of gabapentin - 100 mg three times a day or see Dr Neldon Mc (or refer to [facility] or speech path)   Pulmonary follow up is as needed

## 2016-05-23 NOTE — Assessment & Plan Note (Addendum)
W/u by Dr Neldon Mc neg for allergies ? 2015 - Spirometry 05/23/2016  wnl including fef 25-75 - NO 05/23/2016  = 23  The most common causes of chronic cough in immunocompetent adults include the following: upper airway cough syndrome (UACS), previously referred to as postnasal drip syndrome (PNDS), which is caused by variety of rhinosinus conditions; (2) asthma; (3) GERD; (4) chronic bronchitis from cigarette smoking or other inhaled environmental irritants; (5) nonasthmatic eosinophilic bronchitis; and (6) bronchiectasis.   These conditions, singly or in combination, have accounted for up to 94% of the causes of chronic cough in prospective studies.   Other conditions have constituted no >6% of the causes in prospective studies These have included bronchogenic carcinoma, chronic interstitial pneumonia, sarcoidosis, left ventricular failure, ACEI-induced cough, and aspiration from a condition associated with pharyngeal dysfunction.    Chronic cough is often simultaneously caused by more than one condition. A single cause has been found from 38 to 82% of the time, multiple causes from 18 to 62%. Multiply caused cough has been the result of three diseases up to 42% of the time.       This is most likely  Classic Upper airway cough syndrome, so named because it's frequently impossible to sort out how much is  CR/sinusitis with freq throat clearing (which can be related to primary GERD)   vs  causing  secondary (" extra esophageal")  GERD from wide swings in gastric pressure that occur with throat clearing, often  promoting self use of mint and menthol lozenges that reduce the lower esophageal sphincter tone and exacerbate the problem further in a cyclical fashion.   These are the same pts (now being labeled as having "irritable larynx syndrome" by some cough centers) who not infrequently have a history of having failed to tolerate ace inhibitors,  dry powder inhalers or biphosphonates or report having  atypical reflux symptoms that don't respond to standard doses of PPI , and are easily confused as having aecopd or asthma flares by even experienced allergists/ pulmonologists.   rec  Continue max rx for gerd Consider stopping loestrin which reduces LES Consider low dose gabapentin eg 100 mg tid  If this fails > return to Dr Neldon Mc as note she improved p his last eval/rx   Total time devoted to counseling  = 35/27m review case with pt/ discussion of options/alternatives/ personally creating written instructions  in presence of pt  then going over those specific  Instructions directly with the pt including how to use all of the meds but in particular covering each new medication in detail and the difference between the maintenance/automatic meds and the prns using an action plan format for the latter.

## 2016-05-30 DIAGNOSIS — F332 Major depressive disorder, recurrent severe without psychotic features: Secondary | ICD-10-CM | POA: Diagnosis not present

## 2016-06-13 DIAGNOSIS — F332 Major depressive disorder, recurrent severe without psychotic features: Secondary | ICD-10-CM | POA: Diagnosis not present

## 2016-06-14 DIAGNOSIS — F332 Major depressive disorder, recurrent severe without psychotic features: Secondary | ICD-10-CM | POA: Diagnosis not present

## 2016-06-19 DIAGNOSIS — M47814 Spondylosis without myelopathy or radiculopathy, thoracic region: Secondary | ICD-10-CM | POA: Diagnosis not present

## 2016-06-19 DIAGNOSIS — M5137 Other intervertebral disc degeneration, lumbosacral region: Secondary | ICD-10-CM | POA: Diagnosis not present

## 2016-06-19 DIAGNOSIS — M797 Fibromyalgia: Secondary | ICD-10-CM | POA: Diagnosis not present

## 2016-06-19 DIAGNOSIS — G629 Polyneuropathy, unspecified: Secondary | ICD-10-CM | POA: Diagnosis not present

## 2016-06-19 DIAGNOSIS — M533 Sacrococcygeal disorders, not elsewhere classified: Secondary | ICD-10-CM | POA: Diagnosis not present

## 2016-06-20 DIAGNOSIS — G43009 Migraine without aura, not intractable, without status migrainosus: Secondary | ICD-10-CM | POA: Diagnosis not present

## 2016-06-20 DIAGNOSIS — J370 Chronic laryngitis: Secondary | ICD-10-CM | POA: Diagnosis not present

## 2016-06-20 DIAGNOSIS — R5383 Other fatigue: Secondary | ICD-10-CM | POA: Diagnosis not present

## 2016-07-02 DIAGNOSIS — R490 Dysphonia: Secondary | ICD-10-CM | POA: Diagnosis not present

## 2016-07-02 DIAGNOSIS — R5383 Other fatigue: Secondary | ICD-10-CM | POA: Diagnosis not present

## 2016-07-02 DIAGNOSIS — Z79899 Other long term (current) drug therapy: Secondary | ICD-10-CM | POA: Diagnosis not present

## 2016-07-02 DIAGNOSIS — M797 Fibromyalgia: Secondary | ICD-10-CM | POA: Diagnosis not present

## 2016-07-02 DIAGNOSIS — K219 Gastro-esophageal reflux disease without esophagitis: Secondary | ICD-10-CM | POA: Insufficient documentation

## 2016-07-02 DIAGNOSIS — R05 Cough: Secondary | ICD-10-CM | POA: Diagnosis not present

## 2016-07-02 DIAGNOSIS — E039 Hypothyroidism, unspecified: Secondary | ICD-10-CM | POA: Diagnosis not present

## 2016-07-04 DIAGNOSIS — F332 Major depressive disorder, recurrent severe without psychotic features: Secondary | ICD-10-CM | POA: Diagnosis not present

## 2016-07-04 DIAGNOSIS — R635 Abnormal weight gain: Secondary | ICD-10-CM | POA: Diagnosis not present

## 2016-07-04 DIAGNOSIS — Z6835 Body mass index (BMI) 35.0-35.9, adult: Secondary | ICD-10-CM | POA: Diagnosis not present

## 2016-07-04 DIAGNOSIS — E669 Obesity, unspecified: Secondary | ICD-10-CM | POA: Diagnosis not present

## 2016-07-08 DIAGNOSIS — J370 Chronic laryngitis: Secondary | ICD-10-CM | POA: Diagnosis not present

## 2016-07-08 DIAGNOSIS — R200 Anesthesia of skin: Secondary | ICD-10-CM | POA: Diagnosis not present

## 2016-07-08 DIAGNOSIS — R499 Unspecified voice and resonance disorder: Secondary | ICD-10-CM | POA: Diagnosis not present

## 2016-07-11 DIAGNOSIS — F332 Major depressive disorder, recurrent severe without psychotic features: Secondary | ICD-10-CM | POA: Diagnosis not present

## 2016-07-15 DIAGNOSIS — H538 Other visual disturbances: Secondary | ICD-10-CM | POA: Diagnosis not present

## 2016-07-15 DIAGNOSIS — H04123 Dry eye syndrome of bilateral lacrimal glands: Secondary | ICD-10-CM | POA: Diagnosis not present

## 2016-07-17 ENCOUNTER — Ambulatory Visit: Payer: PPO | Admitting: Neurology

## 2016-07-18 DIAGNOSIS — M5137 Other intervertebral disc degeneration, lumbosacral region: Secondary | ICD-10-CM | POA: Diagnosis not present

## 2016-07-18 DIAGNOSIS — M533 Sacrococcygeal disorders, not elsewhere classified: Secondary | ICD-10-CM | POA: Diagnosis not present

## 2016-07-18 DIAGNOSIS — M797 Fibromyalgia: Secondary | ICD-10-CM | POA: Diagnosis not present

## 2016-07-19 ENCOUNTER — Encounter: Payer: Self-pay | Admitting: Neurology

## 2016-07-19 ENCOUNTER — Telehealth: Payer: Self-pay

## 2016-07-19 ENCOUNTER — Ambulatory Visit (INDEPENDENT_AMBULATORY_CARE_PROVIDER_SITE_OTHER): Payer: PPO | Admitting: Neurology

## 2016-07-19 VITALS — BP 96/64 | HR 92 | Ht 69.0 in | Wt 231.0 lb

## 2016-07-19 DIAGNOSIS — G43709 Chronic migraine without aura, not intractable, without status migrainosus: Secondary | ICD-10-CM | POA: Diagnosis not present

## 2016-07-19 MED ORDER — NAPROXEN SODIUM 550 MG PO TABS: 550.0000 mg | ORAL_TABLET | Freq: Two times a day (BID) | ORAL | 3 refills | Status: DC | PRN

## 2016-07-19 MED ORDER — ONDANSETRON 4 MG PO TBDP: 4.0000 mg | ORAL_TABLET | Freq: Three times a day (TID) | ORAL | 2 refills | Status: DC | PRN

## 2016-07-19 MED ORDER — TOPIRAMATE 100 MG PO TABS: 100.0000 mg | ORAL_TABLET | Freq: Two times a day (BID) | ORAL | 3 refills | Status: DC

## 2016-07-19 NOTE — Patient Instructions (Signed)
Migraine Recommendations: 1.  Increase topiramate to 100mg  twice daily.   2.  Stop Excedrin.  Take naproxen 550mg  at earliest onset of headache.  May repeat dose once in 12 hours if needed.  Do not exceed two tablets in 24 hours.  Use Zofran for nausea as prescribed. 3.  Limit use of pain relievers to no more than 2 days out of the week.  These medications include acetaminophen, ibuprofen, triptans and narcotics.  This will help reduce risk of rebound headaches. 4.  Be aware of common food triggers such as processed sweets, processed foods with nitrites (such as deli meat, hot dogs, sausages), foods with MSG, alcohol (such as wine), chocolate, certain cheeses, certain fruits (dried fruits, some citrus fruit), vinegar, diet soda. 4.  Avoid caffeine 5.  Routine exercise 6.  Proper sleep hygiene 7.  Stay adequately hydrated with water 8.  Keep a headache diary. 9.  Maintain proper stress management. 10.  Do not skip meals. 11.  Consider supplements:  Magnesium oxide 400mg  to 600mg  daily, riboflavin 400mg , Coenzyme Q 10 100mg  three times daily 12.  We will set you up for Botox   Migraine Headache A migraine headache is an intense, throbbing pain on one or both sides of your head. A migraine can last for 30 minutes to several hours. CAUSES  The exact cause of a migraine headache is not always known. However, a migraine may be caused when nerves in the brain become irritated and release chemicals that cause inflammation. This causes pain. Certain things may also trigger migraines, such as:  Alcohol.  Smoking.  Stress.  Menstruation.  Aged cheeses.  Foods or drinks that contain nitrates, glutamate, aspartame, or tyramine.  Lack of sleep.  Chocolate.  Caffeine.  Hunger.  Physical exertion.  Fatigue.  Medicines used to treat chest pain (nitroglycerine), birth control pills, estrogen, and some blood pressure medicines. SIGNS AND SYMPTOMS  Pain on one or both sides of your  head.  Pulsating or throbbing pain.  Severe pain that prevents daily activities.  Pain that is aggravated by any physical activity.  Nausea, vomiting, or both.  Dizziness.  Pain with exposure to bright lights, loud noises, or activity.  General sensitivity to bright lights, loud noises, or smells. Before you get a migraine, you may get warning signs that a migraine is coming (aura). An aura may include:  Seeing flashing lights.  Seeing bright spots, halos, or zigzag lines.  Having tunnel vision or blurred vision.  Having feelings of numbness or tingling.  Having trouble talking.  Having muscle weakness. DIAGNOSIS  A migraine headache is often diagnosed based on:  Symptoms.  Physical exam.  A CT scan or MRI of your head. These imaging tests cannot diagnose migraines, but they can help rule out other causes of headaches. TREATMENT Medicines may be given for pain and nausea. Medicines can also be given to help prevent recurrent migraines.  HOME CARE INSTRUCTIONS  Only take over-the-counter or prescription medicines for pain or discomfort as directed by your health care provider. The use of long-term narcotics is not recommended.  Lie down in a dark, quiet room when you have a migraine.  Keep a journal to find out what may trigger your migraine headaches. For example, write down:  What you eat and drink.  How much sleep you get.  Any change to your diet or medicines.  Limit alcohol consumption.  Quit smoking if you smoke.  Get 7-9 hours of sleep, or as recommended by your  health care provider.  Limit stress.  Keep lights dim if bright lights bother you and make your migraines worse. SEEK IMMEDIATE MEDICAL CARE IF:   Your migraine becomes severe.  You have a fever.  You have a stiff neck.  You have vision loss.  You have muscular weakness or loss of muscle control.  You start losing your balance or have trouble walking.  You feel faint or pass  out.  You have severe symptoms that are different from your first symptoms. MAKE SURE YOU:   Understand these instructions.  Will watch your condition.  Will get help right away if you are not doing well or get worse.   This information is not intended to replace advice given to you by your health care provider. Make sure you discuss any questions you have with your health care provider.   Document Released: 11/18/2005 Document Revised: 12/09/2014 Document Reviewed: 07/26/2013 Elsevier Interactive Patient Education [employer].

## 2016-07-19 NOTE — Progress Notes (Signed)
Chart forwarded.  

## 2016-07-19 NOTE — Progress Notes (Signed)
NEUROLOGY CONSULTATION NOTE  AYE WILKER MRN: [id number] DOB: [birth year]  Referring provider: Brett Fairy, PA-C Primary care provider: Ernestene Kiel, MD  Reason for consult:  headache  HISTORY OF PRESENT ILLNESS: Paige [name] is a 50 year old right-handed woman with depression who presents for headache.  Limited history also obtained from notes provided by the referring provider.  Onset:  Since teenager.  She has migraines as well as daily headache which have gotten worse over the past 2 years. Location:  Migraines: bi-frontal/retro-orbital/temples; Daily headache:  Bi-frontal/temporal Quality:  Migraine: pounding; Daily headache: non-throbbing Intensity:  Migraine: 10/10; Daily headache: 6/10 Aura:  no Prodrome:  no Associated symptoms:  Migraine: nausea, photophobia, phonophobia, osmophobia, sometimes vomiting; Daily headache: sometimes photophobia Duration:  Migraine:  Several hours; Daily headache: constant Frequency:  Migraine: every other week; Daily headache:  constant Triggers/exacerbating factors:  Certain scents, bright light, pork Relieving factors:  rest Activity:  Cannot function with migraine  Past NSAIDS:  Ibuprofen Past analgesics:  Percocet (hives), morphine (itching) Past abortive triptans:  sumatriptan (difficulty breathing) Past muscle relaxants:  cyclobenzaprine (hallucinations) Past anti-emetic:  no Past antihypertensive medications:  no Past antidepressant medications:  Amitriptyline, possibly venlafaxine Past anticonvulsant medications:  Depakote Past vitamins/Herbal/Supplements:  no Other past therapy:  acupuncture  Current NSAIDS:  no Current analgesics:  Excedrin (takes 1 to 2 days per week) Current triptans:  no Current anti-emetic:  Zofran ODT 4mg , promethazine 25mg  Current muscle relaxants:  baclofen 10mg  Current anti-anxiolytic:  clonazepam 1mg  Current sleep aide:  trazodone 100mg  Current Antihypertensive medications:   no Current Antidepressant medications:  Cymbalta 60mg , bupropion ER 200mg  Current Anticonvulsant medications:  topiramate 50mg  twice daily Current Vitamins/Herbal/Supplements:  B complex, C, CoQ-10 Current Antihistamines/Decongestants:  no Other therapy:  no  Caffeine:  rarely Alcohol:  no Smoker:  no Diet:  hydrates Exercise:  Not routine Depression/stress:  controlled Sleep hygiene:  fair Family history of headache:  Paternal aunt  PAST MEDICAL HISTORY: Past Medical History:  Diagnosis Date  . Anemia   . Anxiety disorder   . Chronic headaches   . Depression   . Family history of colon cancer    mother,uncles,aunts  . Fibromyalgia   . Gallstones   . GERD (gastroesophageal reflux disease)   . Hypothyroidism   . IBS (irritable bowel syndrome)   . IBS (irritable bowel syndrome)   . Obesity   . Obesity   . Personal history of colonic polyps 04/2006   polypoid    PAST SURGICAL HISTORY: Past Surgical History:  Procedure Laterality Date  . ANTERIOR CRUCIATE LIGAMENT REPAIR Left 2003  . APPENDECTOMY  1993  . CHOLECYSTECTOMY  1999  . COLONOSCOPY  2009   [facility]  . REPAIR ANKLE LIGAMENT Left 2001  . ROUX-EN-Y GASTRIC BYPASS  04/2004   Dr Johnathan Hausen    MEDICATIONS: Current Outpatient Prescriptions on File Prior to Visit  Medication Sig Dispense Refill  . albuterol (PROVENTIL) (2.5 MG/3ML) 0.083% nebulizer solution Take 2.5 mg by nebulization 3 (three) times daily as needed for wheezing or shortness of breath.    . baclofen (LIORESAL) 10 MG tablet Take 10 mg by mouth 4 (four) times daily.     . Budesonide (RHINOCORT AQUA NA) Place 2 sprays into the nose daily.    Marland Kitchen buPROPion (WELLBUTRIN SR) 150 MG 12 hr tablet 2 every am and 1 at bedtime    . cetirizine (ZYRTEC) 10 MG tablet Take 1 tablet (10 mg total) by mouth daily. 1 tablet 0  .  clonazePAM (KLONOPIN) 1 MG tablet Take 1-2 mg by mouth at bedtime as needed.     . Coenzyme Q10 (CO Q 10 PO) Take 1 capsule by mouth  daily.    Marland Kitchen DHEA 25 MG CAPS Take 1 capsule by mouth every morning.    . DULoxetine (CYMBALTA) 60 MG capsule Take 60 mg by mouth 2 (two) times daily.     Marland Kitchen ipratropium (ATROVENT) 0.03 % nasal spray Place 2 sprays into both nostrils every 12 (twelve) hours. 30 mL 12  . levothyroxine (SYNTHROID, LEVOTHROID) 88 MCG tablet Take 88 mcg by mouth daily before breakfast.    . Magnesium Cl-Calcium Carbonate (SLOW-MAG PO) Take 2 tablets by mouth at bedtime.    . montelukast (SINGULAIR) 10 MG tablet Take 10 mg by mouth at bedtime.    Marland Kitchen olopatadine (PATANOL) 0.1 % ophthalmic solution Place 1 drop into both eyes daily.    Marland Kitchen omeprazole (PRILOSEC) 40 MG capsule Take 40 mg by mouth daily.    . Probiotic Product (PROBIOTIC ADVANCED PO) Take 1 capsule by mouth daily.    . promethazine (PHENERGAN) 25 MG tablet Take 25 mg by mouth as needed. Migraines    . ranitidine (ZANTAC) 300 MG capsule Take 300 mg by mouth every evening.    . sennosides-docusate sodium (SENOKOT-S) 8.6-50 MG tablet Take 1 tablet by mouth daily.    . traZODone (DESYREL) 100 MG tablet Take 200 mg by mouth at bedtime.     No current facility-administered medications on file prior to visit.     ALLERGIES: Allergies  Allergen Reactions  . Oxycodone-Acetaminophen Hives  . Sumatriptan Other (See Comments)    Difficulty breathing  . Flexeril [Cyclobenzaprine Hcl]   . Imitrex [Sumatriptan Base]   . Morphine And Related   . Percocet [Oxycodone-Acetaminophen]   . Pork (Porcine) Protein     Other reaction(s): Other (See Comments) Migraines    FAMILY HISTORY: Family History  Problem Relation Age of Onset  . Colon cancer Mother     uncles,aunts  . Diabetes Father     paternal grandmother,sister  . Leukemia Father   . Lymphoma Father   . Skin cancer Father   . Heart disease Father   . Colon polyps Sister     1st degree cousins  . Diabetes Sister   . Stomach cancer Neg Hx     SOCIAL HISTORY: Social History   Social History  .  Marital status: Single    Spouse name: N/A  . Number of children: 0  . Years of education: N/A   Occupational History  . RN/disabled    Social History Main Topics  . Smoking status: Never Smoker  . Smokeless tobacco: Never Used  . Alcohol use 0.0 oz/week     Comment: rarely  . Drug use: No  . Sexual activity: Not on file   Other Topics Concern  . Not on file   Social History Narrative  . No narrative on file    REVIEW OF SYSTEMS: Constitutional: No fevers, chills, or sweats, no generalized fatigue, change in appetite Eyes: No visual changes, double vision, eye pain Ear, nose and throat: No hearing loss, ear pain, nasal congestion, sore throat Cardiovascular: No chest pain, palpitations Respiratory:  No shortness of breath at rest or with exertion, wheezes GastrointestinaI: No nausea, vomiting, diarrhea, abdominal pain, fecal incontinence Genitourinary:  No dysuria, urinary retention or frequency Musculoskeletal:  No neck pain, back pain Integumentary: No rash, pruritus, skin lesions Neurological: as above Psychiatric: No  depression, insomnia, anxiety Endocrine: No palpitations, fatigue, diaphoresis, mood swings, change in appetite, change in weight, increased thirst Hematologic/Lymphatic:  No purpura, petechiae. Allergic/Immunologic: no itchy/runny eyes, nasal congestion, recent allergic reactions, rashes  PHYSICAL EXAM: Vitals:   07/19/16 0936  BP: 96/64  Pulse: 92   General: No acute distress.  Patient appears well-groomed.  Head:  Normocephalic/atraumatic Eyes:  fundi examined but not visualized Neck: supple, no paraspinal tenderness, full range of motion Back: No paraspinal tenderness Heart: regular rate and rhythm Lungs: Clear to auscultation bilaterally. Vascular: No carotid bruits. Neurological Exam: Mental status: alert and oriented to person, place, and time, recent and remote memory intact, fund of knowledge intact, attention and concentration intact,  speech fluent and not dysarthric, language intact. Cranial nerves: CN I: not tested CN II: pupils equal, round and reactive to light, visual fields intact CN III, IV, VI:  full range of motion, no nystagmus, no ptosis CN V: facial sensation intact CN VII: upper and lower face symmetric CN VIII: hearing intact CN IX, X: gag intact, uvula midline CN XI: sternocleidomastoid and trapezius muscles intact CN XII: tongue midline Bulk & Tone: normal, no fasciculations. Motor:  5/5 throughout  Sensation: temperature and vibration sensation intact. Deep Tendon Reflexes:  2+ throughout, toes downgoing.  Finger to nose testing:  Without dysmetria.  Heel to shin:  Without dysmetria.  Gait:  Normal station and stride.  Able to turn and tandem walk. Romberg negative.  IMPRESSION: Chronic migraine.  PLAN: 1.  I would like to set her up for Botox.  She has over 15 headache days per month for a couple of years.  She has failed multiple preventative medications.  I would not prescribe an anti-hypertensive because she already has baseline low blood pressure. 2.  For abortive therapy, will prescribe naproxen 550mg  along with Zofran ODT 4mg  for nausea.  I would not prescribe another triptan due to prior anaphylaxis reaction.  I am hesitant to prescribe tramadol as she is on 2 antidepressants. 3.  Discussed lifestyle modification. 4.  In the meantime, we will increase topiramate to 100mg  twice daily.  Will obtain recent labs (BMP) from PCP. 5.  Follow up for Botox.  Information provided.  Thank you for allowing me to take part in the care of this patient.  Metta Clines, DO  CC:  Ernestene Kiel, MD  Brett Fairy, PA-C

## 2016-07-19 NOTE — Telephone Encounter (Signed)
Botox P.A. Initiated.

## 2016-07-24 DIAGNOSIS — R490 Dysphonia: Secondary | ICD-10-CM | POA: Diagnosis not present

## 2016-07-24 DIAGNOSIS — J383 Other diseases of vocal cords: Secondary | ICD-10-CM | POA: Diagnosis not present

## 2016-07-24 DIAGNOSIS — M797 Fibromyalgia: Secondary | ICD-10-CM | POA: Diagnosis not present

## 2016-08-01 DIAGNOSIS — E669 Obesity, unspecified: Secondary | ICD-10-CM | POA: Diagnosis not present

## 2016-08-01 DIAGNOSIS — R490 Dysphonia: Secondary | ICD-10-CM | POA: Diagnosis not present

## 2016-08-01 DIAGNOSIS — Z8669 Personal history of other diseases of the nervous system and sense organs: Secondary | ICD-10-CM | POA: Diagnosis not present

## 2016-08-01 DIAGNOSIS — R51 Headache: Secondary | ICD-10-CM | POA: Diagnosis not present

## 2016-08-01 DIAGNOSIS — Z9884 Bariatric surgery status: Secondary | ICD-10-CM | POA: Diagnosis not present

## 2016-08-01 DIAGNOSIS — F321 Major depressive disorder, single episode, moderate: Secondary | ICD-10-CM | POA: Diagnosis not present

## 2016-08-01 DIAGNOSIS — R635 Abnormal weight gain: Secondary | ICD-10-CM | POA: Diagnosis not present

## 2016-08-01 DIAGNOSIS — Z6833 Body mass index (BMI) 33.0-33.9, adult: Secondary | ICD-10-CM | POA: Diagnosis not present

## 2016-08-01 DIAGNOSIS — F419 Anxiety disorder, unspecified: Secondary | ICD-10-CM | POA: Diagnosis not present

## 2016-08-12 DIAGNOSIS — R490 Dysphonia: Secondary | ICD-10-CM | POA: Insufficient documentation

## 2016-08-12 DIAGNOSIS — J383 Other diseases of vocal cords: Secondary | ICD-10-CM | POA: Insufficient documentation

## 2016-08-14 DIAGNOSIS — M533 Sacrococcygeal disorders, not elsewhere classified: Secondary | ICD-10-CM | POA: Diagnosis not present

## 2016-08-14 DIAGNOSIS — M5137 Other intervertebral disc degeneration, lumbosacral region: Secondary | ICD-10-CM | POA: Diagnosis not present

## 2016-08-14 DIAGNOSIS — M797 Fibromyalgia: Secondary | ICD-10-CM | POA: Diagnosis not present

## 2016-08-15 DIAGNOSIS — F321 Major depressive disorder, single episode, moderate: Secondary | ICD-10-CM | POA: Diagnosis not present

## 2016-08-15 DIAGNOSIS — E669 Obesity, unspecified: Secondary | ICD-10-CM | POA: Diagnosis not present

## 2016-08-15 DIAGNOSIS — Z6833 Body mass index (BMI) 33.0-33.9, adult: Secondary | ICD-10-CM | POA: Diagnosis not present

## 2016-08-15 DIAGNOSIS — Z713 Dietary counseling and surveillance: Secondary | ICD-10-CM | POA: Diagnosis not present

## 2016-08-21 ENCOUNTER — Ambulatory Visit (INDEPENDENT_AMBULATORY_CARE_PROVIDER_SITE_OTHER): Payer: PPO | Admitting: Neurology

## 2016-08-21 DIAGNOSIS — G43701 Chronic migraine without aura, not intractable, with status migrainosus: Secondary | ICD-10-CM

## 2016-08-21 MED ORDER — ONABOTULINUMTOXINA 100 UNITS IJ SOLR
155.0000 [IU] | Freq: Once | INTRAMUSCULAR | Status: AC
  Administered 2016-08-21: 155 [IU] via INTRAMUSCULAR

## 2016-08-21 NOTE — Procedures (Signed)
[facility]   Procedure Note Botox  Attending: Dr. Tomi Likens  Preoperative Diagnosis(es): Chronic migraine  Consent obtained from: The patient Benefits discussed included, but were not limited to decreased muscle tightness, increased joint range of motion, and decreased pain.  Risk discussed included, but were not limited pain and discomfort, bleeding, bruising, excessive weakness, venous thrombosis, muscle atrophy and dysphagia.  Anticipated outcomes of the procedure as well as he risks and benefits of the alternatives to the procedure, and the roles and tasks of the personnel to be involved, were discussed with the patient, and the patient consents to the procedure and agrees to proceed. A copy of the patient medication guide was given to the patient which explains the blackbox warning.  Patients identity and treatment sites confirmed Yes.  .  Details of Procedure: Skin was cleaned with alcohol. Prior to injection, the needle plunger was aspirated to make sure the needle was not within a blood vessel.  There was no blood retrieved on aspiration.    Following is a summary of the muscles injected  And the amount of Botulinum toxin used:  Dilution 200 units of Botox was reconstituted with 4 ml of preservative free normal saline. Time of reconstitution: At the time of the office visit (<30 minutes prior to injection)   Injections  155 total units of Botox was injected with a 30 gauge needle.  Injection Sites: L occipitalis: 15 units- 3 sites  R occiptalis: 15 units- 3 sites  L upper trapezius: 15 units- 3 sites R upper trapezius: 15 units- 3 sits          L paraspinal: 10 units- 2 sites R paraspinal: 10 units- 2 sites  Face L frontalis(2 injection sites):10 units   R frontalis(2 injection sites):10 units         L corrugator: 5 units   R corrugator: 5 units           Procerus: 5 units   L temporalis: 20 units R temporalis: 20 units   Agent:  200 units of botulinum Type A  (Onobotulinum Toxin type A) was reconstituted with 4 ml of preservative free normal saline.  Time of reconstitution: At the time of the office visit (<30 minutes prior to injection)     Total injected (Units): 145  Total wasted (Units): 55  Patient tolerated procedure well without complications.   Reinjection is anticipated in 3 months.  Shavonta Gossen R. Tomi Likens, DO

## 2016-08-29 DIAGNOSIS — F331 Major depressive disorder, recurrent, moderate: Secondary | ICD-10-CM | POA: Diagnosis not present

## 2016-09-05 DIAGNOSIS — M533 Sacrococcygeal disorders, not elsewhere classified: Secondary | ICD-10-CM | POA: Diagnosis not present

## 2016-09-05 DIAGNOSIS — M5137 Other intervertebral disc degeneration, lumbosacral region: Secondary | ICD-10-CM | POA: Diagnosis not present

## 2016-09-05 DIAGNOSIS — M797 Fibromyalgia: Secondary | ICD-10-CM | POA: Diagnosis not present

## 2016-09-06 DIAGNOSIS — F332 Major depressive disorder, recurrent severe without psychotic features: Secondary | ICD-10-CM | POA: Diagnosis not present

## 2016-09-12 DIAGNOSIS — F339 Major depressive disorder, recurrent, unspecified: Secondary | ICD-10-CM | POA: Diagnosis not present

## 2016-09-19 DIAGNOSIS — Z6833 Body mass index (BMI) 33.0-33.9, adult: Secondary | ICD-10-CM | POA: Diagnosis not present

## 2016-09-19 DIAGNOSIS — E669 Obesity, unspecified: Secondary | ICD-10-CM | POA: Diagnosis not present

## 2016-09-19 DIAGNOSIS — Z713 Dietary counseling and surveillance: Secondary | ICD-10-CM | POA: Diagnosis not present

## 2016-09-19 DIAGNOSIS — M797 Fibromyalgia: Secondary | ICD-10-CM | POA: Diagnosis not present

## 2016-09-26 DIAGNOSIS — F331 Major depressive disorder, recurrent, moderate: Secondary | ICD-10-CM | POA: Diagnosis not present

## 2016-10-09 DIAGNOSIS — M533 Sacrococcygeal disorders, not elsewhere classified: Secondary | ICD-10-CM | POA: Diagnosis not present

## 2016-10-09 DIAGNOSIS — M5137 Other intervertebral disc degeneration, lumbosacral region: Secondary | ICD-10-CM | POA: Diagnosis not present

## 2016-10-09 DIAGNOSIS — M797 Fibromyalgia: Secondary | ICD-10-CM | POA: Diagnosis not present

## 2016-10-10 DIAGNOSIS — F331 Major depressive disorder, recurrent, moderate: Secondary | ICD-10-CM | POA: Diagnosis not present

## 2016-10-23 DIAGNOSIS — M2550 Pain in unspecified joint: Secondary | ICD-10-CM | POA: Diagnosis not present

## 2016-10-23 DIAGNOSIS — Z79899 Other long term (current) drug therapy: Secondary | ICD-10-CM | POA: Diagnosis not present

## 2016-10-23 DIAGNOSIS — B372 Candidiasis of skin and nail: Secondary | ICD-10-CM | POA: Diagnosis not present

## 2016-10-23 DIAGNOSIS — R29898 Other symptoms and signs involving the musculoskeletal system: Secondary | ICD-10-CM | POA: Diagnosis not present

## 2016-10-23 DIAGNOSIS — E039 Hypothyroidism, unspecified: Secondary | ICD-10-CM | POA: Diagnosis not present

## 2016-10-23 DIAGNOSIS — R102 Pelvic and perineal pain: Secondary | ICD-10-CM | POA: Diagnosis not present

## 2016-10-29 DIAGNOSIS — R29898 Other symptoms and signs involving the musculoskeletal system: Secondary | ICD-10-CM | POA: Diagnosis not present

## 2016-10-31 DIAGNOSIS — R635 Abnormal weight gain: Secondary | ICD-10-CM | POA: Diagnosis not present

## 2016-10-31 DIAGNOSIS — F3341 Major depressive disorder, recurrent, in partial remission: Secondary | ICD-10-CM | POA: Diagnosis not present

## 2016-10-31 DIAGNOSIS — F419 Anxiety disorder, unspecified: Secondary | ICD-10-CM | POA: Diagnosis not present

## 2016-10-31 DIAGNOSIS — F5081 Binge eating disorder: Secondary | ICD-10-CM | POA: Diagnosis not present

## 2016-10-31 DIAGNOSIS — E669 Obesity, unspecified: Secondary | ICD-10-CM | POA: Diagnosis not present

## 2016-10-31 DIAGNOSIS — Z6832 Body mass index (BMI) 32.0-32.9, adult: Secondary | ICD-10-CM | POA: Diagnosis not present

## 2016-10-31 DIAGNOSIS — Z9884 Bariatric surgery status: Secondary | ICD-10-CM | POA: Diagnosis not present

## 2016-11-08 ENCOUNTER — Ambulatory Visit (INDEPENDENT_AMBULATORY_CARE_PROVIDER_SITE_OTHER): Payer: PPO | Admitting: Neurology

## 2016-11-08 DIAGNOSIS — G43701 Chronic migraine without aura, not intractable, with status migrainosus: Secondary | ICD-10-CM

## 2016-11-08 MED ORDER — ONABOTULINUMTOXINA 100 UNITS IJ SOLR
155.0000 [IU] | Freq: Once | INTRAMUSCULAR | Status: AC
  Administered 2016-11-08: 155 [IU] via INTRAMUSCULAR

## 2016-11-08 NOTE — Progress Notes (Signed)
[facility]   Procedure Note Botox  Attending: Dr. Metta Clines  Preoperative Diagnosis(es): Chronic migraine  Consent obtained from: The patient Benefits discussed included, but were not limited to decreased muscle tightness, increased joint range of motion, and decreased pain.  Risk discussed included, but were not limited pain and discomfort, bleeding, bruising, excessive weakness, venous thrombosis, muscle atrophy and dysphagia.  Anticipated outcomes of the procedure as well as he risks and benefits of the alternatives to the procedure, and the roles and tasks of the personnel to be involved, were discussed with the patient, and the patient consents to the procedure and agrees to proceed. A copy of the patient medication guide was given to the patient which explains the blackbox warning.  Patients identity and treatment sites confirmed Yes.  .  Details of Procedure: Skin was cleaned with alcohol. Prior to injection, the needle plunger was aspirated to make sure the needle was not within a blood vessel.  There was no blood retrieved on aspiration.    Following is a summary of the muscles injected  And the amount of Botulinum toxin used:  Dilution 200 units of Botox was reconstituted with 4 ml of preservative free normal saline. Time of reconstitution: At the time of the office visit (<30 minutes prior to injection)   Injections  155 total units of Botox was injected with a 30 gauge needle.  Injection Sites: L occipitalis: 15 units- 3 sites  R occiptalis: 15 units- 3 sites  L upper trapezius: 15 units- 3 sites R upper trapezius: 15 units- 3 sits          L paraspinal: 10 units- 2 sites R paraspinal: 10 units- 2 sites  Face L frontalis(2 injection sites):10 units   R frontalis(2 injection sites):10 units         L corrugator: 5 units   R corrugator: 5 units           Procerus: 5 units   L temporalis: 20 units R temporalis: 20 units   Agent:  200 units of botulinum Type  A (Onobotulinum Toxin type A) was reconstituted with 4 ml of preservative free normal saline.  Time of reconstitution: At the time of the office visit (<30 minutes prior to injection)     Total injected (Units): 155  Total wasted (Units): 30  Patient tolerated procedure well without complications.   Reinjection is anticipated in 3 months. Return to clinic in January.  Paige Caul R. Tomi Likens, DO

## 2016-11-12 DIAGNOSIS — M533 Sacrococcygeal disorders, not elsewhere classified: Secondary | ICD-10-CM | POA: Diagnosis not present

## 2016-11-29 DIAGNOSIS — F332 Major depressive disorder, recurrent severe without psychotic features: Secondary | ICD-10-CM | POA: Diagnosis not present

## 2016-12-05 DIAGNOSIS — M797 Fibromyalgia: Secondary | ICD-10-CM | POA: Diagnosis not present

## 2016-12-05 DIAGNOSIS — G894 Chronic pain syndrome: Secondary | ICD-10-CM | POA: Diagnosis not present

## 2016-12-05 DIAGNOSIS — M47894 Other spondylosis, thoracic region: Secondary | ICD-10-CM | POA: Diagnosis not present

## 2016-12-05 DIAGNOSIS — M62830 Muscle spasm of back: Secondary | ICD-10-CM | POA: Diagnosis not present

## 2016-12-05 DIAGNOSIS — E039 Hypothyroidism, unspecified: Secondary | ICD-10-CM | POA: Diagnosis not present

## 2016-12-05 DIAGNOSIS — M549 Dorsalgia, unspecified: Secondary | ICD-10-CM | POA: Diagnosis not present

## 2016-12-05 DIAGNOSIS — M62838 Other muscle spasm: Secondary | ICD-10-CM | POA: Diagnosis not present

## 2016-12-05 DIAGNOSIS — M5136 Other intervertebral disc degeneration, lumbar region: Secondary | ICD-10-CM | POA: Diagnosis not present

## 2016-12-10 ENCOUNTER — Encounter: Payer: Self-pay | Admitting: Neurology

## 2016-12-10 ENCOUNTER — Telehealth: Payer: Self-pay

## 2016-12-10 ENCOUNTER — Ambulatory Visit (INDEPENDENT_AMBULATORY_CARE_PROVIDER_SITE_OTHER): Payer: PPO | Admitting: Neurology

## 2016-12-10 VITALS — BP 106/70 | HR 76 | Ht 69.0 in | Wt 210.4 lb

## 2016-12-10 DIAGNOSIS — G43009 Migraine without aura, not intractable, without status migrainosus: Secondary | ICD-10-CM

## 2016-12-10 MED ORDER — TOPIRAMATE 100 MG PO TABS: 100.0000 mg | ORAL_TABLET | Freq: Two times a day (BID) | ORAL | 3 refills | Status: DC

## 2016-12-10 MED ORDER — DICLOFENAC POTASSIUM(MIGRAINE) 50 MG PO PACK: 1.0000 | PACK | Freq: Once | ORAL | 3 refills | Status: DC | PRN

## 2016-12-10 NOTE — Telephone Encounter (Signed)
Spoke to patient. Advised to increase Topiramate 100 mg to bid per Dr. Tomi Likens. Patient verbalized understanding. Request refill due to will run out soon. Sent Rx.

## 2016-12-10 NOTE — Patient Instructions (Signed)
1.  For headaches, take Cambia.  Mix one packet in water and drink. Take as directed.  2.  Verify dose of topamax and let us know 3.  Follow up in March for Botox

## 2016-12-10 NOTE — Progress Notes (Signed)
NEUROLOGY FOLLOW UP OFFICE NOTE  KAYLANY BICKLER VH:[id number]  HISTORY OF PRESENT ILLNESS: Paige [name] is a 51 year old right-handed woman with depression who follows up for migraine.  UPDATE: No migraines.  Still has dull headache Intensity:  4/10 Duration:  6-8 hours Frequency:  8 days per month Current NSAIDS:  naproxen 550mg  Current analgesics: no Current triptans:  no Current anti-emetic:  Zofran ODT 4mg  Current muscle relaxants:  baclofen 10mg  Current anti-anxiolytic:  clonazepam 1mg  Current sleep aide:  trazodone 100mg  Current Antihypertensive medications:  no Current Antidepressant medications:  Cymbalta 60mg , bupropion ER 200mg  Current Anticonvulsant medications:  topiramate 50mg  in AM and 100mg  in PM Current Vitamins/Herbal/Supplements:  B complex, C, CoQ-10 Current Antihistamines/Decongestants:  no Other therapy:  Botox (status post 2 rounds)   Caffeine:  rarely Alcohol:  no Smoker:  no Diet:  hydrates Exercise:  Not routine Depression/stress:  controlled Sleep hygiene:  fair  HISTORY:  Onset:  Since teenager.  She has migraines as well as daily headache which have gotten worse over the past 2 years. Location:  Migraines: bi-frontal/retro-orbital/temples; Daily headache:  Bi-frontal/temporal Quality:  Migraine: pounding; Daily headache: non-throbbing Intensity:  Migraine: 10/10; Daily headache: 6/10 Aura:  no Prodrome:  no Associated symptoms:  Migraine: nausea, photophobia, phonophobia, osmophobia, sometimes vomiting; Daily headache: sometimes photophobia Duration:  Migraine:  Several hours; Daily headache: constant Frequency:  Migraine: every other week; Daily headache:  constant Triggers/exacerbating factors:  Certain scents, bright light, pork Relieving factors:  rest Activity:  Cannot function with migraine   Past NSAIDS:  Ibuprofen Past analgesics:  Percocet (hives), morphine (itching) Past abortive triptans:  sumatriptan (difficulty  breathing) Past muscle relaxants:  cyclobenzaprine (hallucinations) Past anti-emetic:  phenergan Past antihypertensive medications:  no Past antidepressant medications:  Amitriptyline, possibly venlafaxine Past anticonvulsant medications:  Depakote Past vitamins/Herbal/Supplements:  no Other past therapy:  acupuncture   Family history of headache:  Paternal aunt  PAST MEDICAL HISTORY: Past Medical History:  Diagnosis Date  . Anemia   . Anxiety disorder   . Chronic headaches   . Depression   . Family history of colon cancer    mother,uncles,aunts  . Fibromyalgia   . Gallstones   . GERD (gastroesophageal reflux disease)   . Hypothyroidism   . IBS (irritable bowel syndrome)   . IBS (irritable bowel syndrome)   . Obesity   . Obesity   . Personal history of colonic polyps 04/2006   polypoid    MEDICATIONS: Current Outpatient Prescriptions on File Prior to Visit  Medication Sig Dispense Refill  . Ascorbic Acid (VITAMIN C) 1000 MG tablet Take 240 mg by mouth.    . baclofen (LIORESAL) 10 MG tablet Take 10 mg by mouth 4 (four) times daily.     . Budesonide (RHINOCORT AQUA NA) Place 2 sprays into the nose daily.    Marland Kitchen buPROPion (WELLBUTRIN SR) 150 MG 12 hr tablet 2 every am and 1 at bedtime    . cetirizine (ZYRTEC) 10 MG tablet Take 1 tablet (10 mg total) by mouth daily. 1 tablet 0  . clonazePAM (KLONOPIN) 1 MG tablet Take 1-2 mg by mouth at bedtime as needed.     . Coenzyme Q10 (CO Q 10 PO) Take 1 capsule by mouth daily.    Marland Kitchen DHEA 25 MG CAPS Take 1 capsule by mouth every morning.    . DULoxetine (CYMBALTA) 60 MG capsule Take 60 mg by mouth 2 (two) times daily.     Marland Kitchen ipratropium (ATROVENT) 0.03 %  nasal spray Place 2 sprays into both nostrils every 12 (twelve) hours. 30 mL 12  . levothyroxine (SYNTHROID, LEVOTHROID) 88 MCG tablet Take 88 mcg by mouth daily before breakfast.    . Magnesium Cl-Calcium Carbonate (SLOW-MAG PO) Take 2 tablets by mouth at bedtime.    . montelukast  (SINGULAIR) 10 MG tablet Take 10 mg by mouth at bedtime.    . naproxen sodium (ANAPROX DS) 550 MG tablet Take 1 tablet (550 mg total) by mouth every 12 (twelve) hours as needed. 16 tablet 3  . norethindrone-ethinyl estradiol (MICROGESTIN,JUNEL,LOESTRIN) 1-20 MG-MCG tablet 1 tablet daily.    Marland Kitchen olopatadine (PATANOL) 0.1 % ophthalmic solution Place 1 drop into both eyes daily.    Marland Kitchen omeprazole (PRILOSEC) 40 MG capsule Take 40 mg by mouth daily.    . ondansetron (ZOFRAN-ODT) 4 MG disintegrating tablet Take 1 tablet (4 mg total) by mouth every 8 (eight) hours as needed for nausea or vomiting. 20 tablet 2  . Probiotic Product (PROBIOTIC ADVANCED PO) Take 1 capsule by mouth daily.    . ranitidine (ZANTAC) 300 MG capsule Take 300 mg by mouth every evening.    . sennosides-docusate sodium (SENOKOT-S) 8.6-50 MG tablet Take 1 tablet by mouth daily.    Marland Kitchen topiramate (TOPAMAX) 100 MG tablet Take 1 tablet (100 mg total) by mouth 2 (two) times daily. 60 tablet 3  . traZODone (DESYREL) 100 MG tablet Take 200 mg by mouth at bedtime.     No current facility-administered medications on file prior to visit.     ALLERGIES: Allergies  Allergen Reactions  . Oxycodone-Acetaminophen Hives  . Sumatriptan Other (See Comments)    Difficulty breathing  . Flexeril [Cyclobenzaprine Hcl]   . Imitrex [Sumatriptan Base]   . Morphine And Related   . Percocet [Oxycodone-Acetaminophen]   . Pork (Porcine) Protein     Other reaction(s): Other (See Comments) Migraines    FAMILY HISTORY: Family History  Problem Relation Age of Onset  . Colon cancer Mother     uncles,aunts  . Diabetes Father     paternal grandmother,sister  . Leukemia Father   . Lymphoma Father   . Skin cancer Father   . Heart disease Father   . Colon polyps Sister     1st degree cousins  . Diabetes Sister   . Stomach cancer Neg Hx     SOCIAL HISTORY: Social History   Social History  . Marital status: Single    Spouse name: N/A  . Number of  children: 0  . Years of education: N/A   Occupational History  . RN/disabled    Social History Main Topics  . Smoking status: Never Smoker  . Smokeless tobacco: Never Used  . Alcohol use 0.0 oz/week     Comment: rarely  . Drug use: No  . Sexual activity: Not on file   Other Topics Concern  . Not on file   Social History Narrative  . No narrative on file    REVIEW OF SYSTEMS: Constitutional: No fevers, chills, or sweats, no generalized fatigue, change in appetite Eyes: No visual changes, double vision, eye pain Ear, nose and throat: No hearing loss, ear pain, nasal congestion, sore throat Cardiovascular: No chest pain, palpitations Respiratory:  No shortness of breath at rest or with exertion, wheezes GastrointestinaI: No nausea, vomiting, diarrhea, abdominal pain, fecal incontinence Genitourinary:  No dysuria, urinary retention or frequency Musculoskeletal:  No neck pain, back pain Integumentary: No rash, pruritus, skin lesions Neurological: as above Psychiatric: No  depression, insomnia, anxiety Endocrine: No palpitations, fatigue, diaphoresis, mood swings, change in appetite, change in weight, increased thirst Hematologic/Lymphatic:  No purpura, petechiae. Allergic/Immunologic: no itchy/runny eyes, nasal congestion, recent allergic reactions, rashes  PHYSICAL EXAM: Vitals:   12/10/16 1052  BP: 106/70  Pulse: 76   General: No acute distress.  Patient appears well-groomed.  normal body habitus. Head:  Normocephalic/atraumatic Eyes:  Fundi examined but not visualized Neck: supple, no paraspinal tenderness, full range of motion Heart:  Regular rate and rhythm Lungs:  Clear to auscultation bilaterally Back: No paraspinal tenderness Neurological Exam: alert and oriented to person, place, and time. Attention span and concentration intact, recent and remote memory intact, fund of knowledge intact.  Speech fluent and not dysarthric, language intact.  CN II-XII intact. Bulk  and tone normal, muscle strength 5/5 throughout.  Sensation to light touch, temperature and vibration intact.  Deep tendon reflexes 2+ throughout, toes downgoing.  Finger to nose and heel to shin testing intact.  Gait normal, Romberg negative.  IMPRESSION: Migraine without aura, improved  PLAN: 1.  Follow up for next round of Botox in March 2.  Will try increasing topiramate to 100mg  twice daily to see if we can reduce frequency a little more. 3.  For abortive therapy, will try Robb Matar, DO  CC:  Ernestene Kiel, MD

## 2016-12-13 DIAGNOSIS — M797 Fibromyalgia: Secondary | ICD-10-CM | POA: Diagnosis not present

## 2016-12-13 DIAGNOSIS — M533 Sacrococcygeal disorders, not elsewhere classified: Secondary | ICD-10-CM | POA: Diagnosis not present

## 2016-12-13 DIAGNOSIS — M5137 Other intervertebral disc degeneration, lumbosacral region: Secondary | ICD-10-CM | POA: Diagnosis not present

## 2016-12-17 NOTE — Telephone Encounter (Signed)
[employer] PA approved from 12/15/2016-12/01/2017. PA# [id number]. Informed pharmacy.

## 2016-12-25 DIAGNOSIS — R21 Rash and other nonspecific skin eruption: Secondary | ICD-10-CM | POA: Diagnosis not present

## 2016-12-25 DIAGNOSIS — B351 Tinea unguium: Secondary | ICD-10-CM | POA: Diagnosis not present

## 2016-12-26 ENCOUNTER — Telehealth: Payer: Self-pay

## 2016-12-26 DIAGNOSIS — F321 Major depressive disorder, single episode, moderate: Secondary | ICD-10-CM | POA: Diagnosis not present

## 2016-12-26 NOTE — Telephone Encounter (Signed)
Received fax from pharmacy pt requesting [employer] alternative due to cost of copay. Called patient. Advised to go onto [employer] and download a savings card. Patient was grateful, verbalized understanding and agreed.

## 2017-01-07 DIAGNOSIS — Z1231 Encounter for screening mammogram for malignant neoplasm of breast: Secondary | ICD-10-CM | POA: Diagnosis not present

## 2017-01-07 DIAGNOSIS — H538 Other visual disturbances: Secondary | ICD-10-CM | POA: Diagnosis not present

## 2017-01-07 DIAGNOSIS — H04123 Dry eye syndrome of bilateral lacrimal glands: Secondary | ICD-10-CM | POA: Diagnosis not present

## 2017-01-23 DIAGNOSIS — F321 Major depressive disorder, single episode, moderate: Secondary | ICD-10-CM | POA: Diagnosis not present

## 2017-01-28 DIAGNOSIS — R5383 Other fatigue: Secondary | ICD-10-CM | POA: Diagnosis not present

## 2017-01-28 DIAGNOSIS — E039 Hypothyroidism, unspecified: Secondary | ICD-10-CM | POA: Diagnosis not present

## 2017-01-28 DIAGNOSIS — Z1389 Encounter for screening for other disorder: Secondary | ICD-10-CM | POA: Diagnosis not present

## 2017-01-28 DIAGNOSIS — D649 Anemia, unspecified: Secondary | ICD-10-CM | POA: Diagnosis not present

## 2017-01-28 DIAGNOSIS — R209 Unspecified disturbances of skin sensation: Secondary | ICD-10-CM | POA: Diagnosis not present

## 2017-01-28 DIAGNOSIS — G43009 Migraine without aura, not intractable, without status migrainosus: Secondary | ICD-10-CM | POA: Diagnosis not present

## 2017-01-28 DIAGNOSIS — Z79899 Other long term (current) drug therapy: Secondary | ICD-10-CM | POA: Diagnosis not present

## 2017-01-28 DIAGNOSIS — R079 Chest pain, unspecified: Secondary | ICD-10-CM | POA: Diagnosis not present

## 2017-01-28 DIAGNOSIS — Z0001 Encounter for general adult medical examination with abnormal findings: Secondary | ICD-10-CM | POA: Diagnosis not present

## 2017-01-28 DIAGNOSIS — Z124 Encounter for screening for malignant neoplasm of cervix: Secondary | ICD-10-CM | POA: Diagnosis not present

## 2017-01-28 DIAGNOSIS — R799 Abnormal finding of blood chemistry, unspecified: Secondary | ICD-10-CM | POA: Diagnosis not present

## 2017-01-28 DIAGNOSIS — R413 Other amnesia: Secondary | ICD-10-CM | POA: Diagnosis not present

## 2017-01-28 DIAGNOSIS — R609 Edema, unspecified: Secondary | ICD-10-CM | POA: Diagnosis not present

## 2017-01-29 DIAGNOSIS — F419 Anxiety disorder, unspecified: Secondary | ICD-10-CM | POA: Diagnosis not present

## 2017-01-29 DIAGNOSIS — R635 Abnormal weight gain: Secondary | ICD-10-CM | POA: Diagnosis not present

## 2017-01-29 DIAGNOSIS — Z8669 Personal history of other diseases of the nervous system and sense organs: Secondary | ICD-10-CM | POA: Diagnosis not present

## 2017-01-29 DIAGNOSIS — M797 Fibromyalgia: Secondary | ICD-10-CM | POA: Diagnosis not present

## 2017-01-29 DIAGNOSIS — Z6830 Body mass index (BMI) 30.0-30.9, adult: Secondary | ICD-10-CM | POA: Diagnosis not present

## 2017-01-29 DIAGNOSIS — Z9884 Bariatric surgery status: Secondary | ICD-10-CM | POA: Diagnosis not present

## 2017-01-29 DIAGNOSIS — E669 Obesity, unspecified: Secondary | ICD-10-CM | POA: Diagnosis not present

## 2017-01-29 DIAGNOSIS — Z713 Dietary counseling and surveillance: Secondary | ICD-10-CM | POA: Diagnosis not present

## 2017-02-07 ENCOUNTER — Ambulatory Visit (INDEPENDENT_AMBULATORY_CARE_PROVIDER_SITE_OTHER): Payer: PPO | Admitting: Neurology

## 2017-02-07 DIAGNOSIS — G43709 Chronic migraine without aura, not intractable, without status migrainosus: Secondary | ICD-10-CM

## 2017-02-07 DIAGNOSIS — IMO0002 Reserved for concepts with insufficient information to code with codable children: Secondary | ICD-10-CM

## 2017-02-07 MED ORDER — ONABOTULINUMTOXINA 100 UNITS IJ SOLR
155.0000 [IU] | Freq: Once | INTRAMUSCULAR | Status: AC
  Administered 2017-02-07: 155 [IU] via INTRAMUSCULAR

## 2017-02-07 NOTE — Progress Notes (Signed)
[facility]   Procedure Note Botox  Attending: Dr. Tomi Likens  Preoperative Diagnosis(es): Chronic migraine  Consent obtained from: The patient Benefits discussed included, but were not limited to decreased muscle tightness, increased joint range of motion, and decreased pain.  Risk discussed included, but were not limited pain and discomfort, bleeding, bruising, excessive weakness, venous thrombosis, muscle atrophy and dysphagia.  Anticipated outcomes of the procedure as well as he risks and benefits of the alternatives to the procedure, and the roles and tasks of the personnel to be involved, were discussed with the patient, and the patient consents to the procedure and agrees to proceed. A copy of the patient medication guide was given to the patient which explains the blackbox warning.  Patients identity and treatment sites confirmed Yes.  .  Details of Procedure: Skin was cleaned with alcohol. Prior to injection, the needle plunger was aspirated to make sure the needle was not within a blood vessel.  There was no blood retrieved on aspiration.    Following is a summary of the muscles injected  And the amount of Botulinum toxin used:  Dilution 200 units of Botox was reconstituted with 4 ml of preservative free normal saline. Time of reconstitution: At the time of the office visit (<30 minutes prior to injection)   Injections  155 total units of Botox was injected with a 30 gauge needle.  Injection Sites: L occipitalis: 15 units- 3 sites  R occiptalis: 15 units- 3 sites  L upper trapezius: 15 units- 3 sites R upper trapezius: 15 units- 3 sits          L paraspinal: 10 units- 2 sites R paraspinal: 10 units- 2 sites  Face L frontalis(2 injection sites):10 units   R frontalis(2 injection sites):10 units         L corrugator: 5 units   R corrugator: 5 units           Procerus: 5 units   L temporalis: 20 units R temporalis: 20 units   Agent:  200 units of botulinum Type A  (Onobotulinum Toxin type A) was reconstituted with 4 ml of preservative free normal saline.  Time of reconstitution: At the time of the office visit (<30 minutes prior to injection)     Total injected (Units): 155  Total wasted (Units): none wasted  Patient tolerated procedure well without complications.   Reinjection is anticipated in 3 months.  NOTE:  Due to cognitive impairment, she has decreased the topiramate from 100mg  twice daily to 50mg  twice daily.  She still finds it difficult to focus and think straight.  I will have her decrease dose to 50mg  at bedtime.  If not improved in 1 week, I instructed her to discontinue the topiramate.  Adam R. Tomi Likens, DO

## 2017-02-18 DIAGNOSIS — I361 Nonrheumatic tricuspid (valve) insufficiency: Secondary | ICD-10-CM | POA: Diagnosis not present

## 2017-02-18 DIAGNOSIS — D2271 Melanocytic nevi of right lower limb, including hip: Secondary | ICD-10-CM | POA: Diagnosis not present

## 2017-02-18 DIAGNOSIS — D225 Melanocytic nevi of trunk: Secondary | ICD-10-CM | POA: Diagnosis not present

## 2017-02-18 DIAGNOSIS — D2262 Melanocytic nevi of left upper limb, including shoulder: Secondary | ICD-10-CM | POA: Diagnosis not present

## 2017-02-18 DIAGNOSIS — D2361 Other benign neoplasm of skin of right upper limb, including shoulder: Secondary | ICD-10-CM | POA: Diagnosis not present

## 2017-02-18 DIAGNOSIS — Z85828 Personal history of other malignant neoplasm of skin: Secondary | ICD-10-CM | POA: Diagnosis not present

## 2017-02-18 DIAGNOSIS — R609 Edema, unspecified: Secondary | ICD-10-CM | POA: Diagnosis not present

## 2017-02-18 DIAGNOSIS — D485 Neoplasm of uncertain behavior of skin: Secondary | ICD-10-CM | POA: Diagnosis not present

## 2017-02-18 DIAGNOSIS — D2261 Melanocytic nevi of right upper limb, including shoulder: Secondary | ICD-10-CM | POA: Diagnosis not present

## 2017-02-18 DIAGNOSIS — R079 Chest pain, unspecified: Secondary | ICD-10-CM | POA: Diagnosis not present

## 2017-02-18 DIAGNOSIS — L821 Other seborrheic keratosis: Secondary | ICD-10-CM | POA: Diagnosis not present

## 2017-02-18 DIAGNOSIS — D2272 Melanocytic nevi of left lower limb, including hip: Secondary | ICD-10-CM | POA: Diagnosis not present

## 2017-02-18 DIAGNOSIS — I081 Rheumatic disorders of both mitral and tricuspid valves: Secondary | ICD-10-CM | POA: Diagnosis not present

## 2017-02-18 DIAGNOSIS — L718 Other rosacea: Secondary | ICD-10-CM | POA: Diagnosis not present

## 2017-02-18 DIAGNOSIS — R001 Bradycardia, unspecified: Secondary | ICD-10-CM | POA: Diagnosis not present

## 2017-02-20 DIAGNOSIS — Z6830 Body mass index (BMI) 30.0-30.9, adult: Secondary | ICD-10-CM | POA: Diagnosis not present

## 2017-02-20 DIAGNOSIS — E669 Obesity, unspecified: Secondary | ICD-10-CM | POA: Diagnosis not present

## 2017-02-20 DIAGNOSIS — M7989 Other specified soft tissue disorders: Secondary | ICD-10-CM | POA: Diagnosis not present

## 2017-02-20 DIAGNOSIS — H6982 Other specified disorders of Eustachian tube, left ear: Secondary | ICD-10-CM | POA: Diagnosis not present

## 2017-02-20 DIAGNOSIS — Z713 Dietary counseling and surveillance: Secondary | ICD-10-CM | POA: Diagnosis not present

## 2017-02-20 DIAGNOSIS — G2581 Restless legs syndrome: Secondary | ICD-10-CM | POA: Diagnosis not present

## 2017-02-20 DIAGNOSIS — L719 Rosacea, unspecified: Secondary | ICD-10-CM | POA: Diagnosis not present

## 2017-02-21 DIAGNOSIS — F332 Major depressive disorder, recurrent severe without psychotic features: Secondary | ICD-10-CM | POA: Diagnosis not present

## 2017-02-24 DIAGNOSIS — L988 Other specified disorders of the skin and subcutaneous tissue: Secondary | ICD-10-CM | POA: Diagnosis not present

## 2017-02-24 DIAGNOSIS — D485 Neoplasm of uncertain behavior of skin: Secondary | ICD-10-CM | POA: Diagnosis not present

## 2017-02-24 DIAGNOSIS — Z85828 Personal history of other malignant neoplasm of skin: Secondary | ICD-10-CM | POA: Diagnosis not present

## 2017-03-14 DIAGNOSIS — R600 Localized edema: Secondary | ICD-10-CM | POA: Diagnosis not present

## 2017-03-14 DIAGNOSIS — R2689 Other abnormalities of gait and mobility: Secondary | ICD-10-CM | POA: Diagnosis not present

## 2017-03-14 DIAGNOSIS — R413 Other amnesia: Secondary | ICD-10-CM | POA: Diagnosis not present

## 2017-03-14 DIAGNOSIS — R238 Other skin changes: Secondary | ICD-10-CM | POA: Diagnosis not present

## 2017-03-21 DIAGNOSIS — M545 Low back pain: Secondary | ICD-10-CM | POA: Diagnosis not present

## 2017-03-26 DIAGNOSIS — R413 Other amnesia: Secondary | ICD-10-CM | POA: Diagnosis not present

## 2017-03-26 DIAGNOSIS — E039 Hypothyroidism, unspecified: Secondary | ICD-10-CM | POA: Diagnosis not present

## 2017-03-26 DIAGNOSIS — B372 Candidiasis of skin and nail: Secondary | ICD-10-CM | POA: Diagnosis not present

## 2017-03-26 DIAGNOSIS — R5383 Other fatigue: Secondary | ICD-10-CM | POA: Diagnosis not present

## 2017-03-26 DIAGNOSIS — R2689 Other abnormalities of gait and mobility: Secondary | ICD-10-CM | POA: Diagnosis not present

## 2017-03-26 DIAGNOSIS — K5901 Slow transit constipation: Secondary | ICD-10-CM | POA: Diagnosis not present

## 2017-03-26 DIAGNOSIS — G2581 Restless legs syndrome: Secondary | ICD-10-CM | POA: Diagnosis not present

## 2017-03-26 DIAGNOSIS — Z9884 Bariatric surgery status: Secondary | ICD-10-CM | POA: Diagnosis not present

## 2017-03-27 DIAGNOSIS — R2689 Other abnormalities of gait and mobility: Secondary | ICD-10-CM | POA: Diagnosis not present

## 2017-03-27 DIAGNOSIS — G2581 Restless legs syndrome: Secondary | ICD-10-CM | POA: Diagnosis not present

## 2017-03-27 DIAGNOSIS — S59911A Unspecified injury of right forearm, initial encounter: Secondary | ICD-10-CM | POA: Diagnosis not present

## 2017-03-27 DIAGNOSIS — Z9884 Bariatric surgery status: Secondary | ICD-10-CM | POA: Diagnosis not present

## 2017-03-27 DIAGNOSIS — R5383 Other fatigue: Secondary | ICD-10-CM | POA: Diagnosis not present

## 2017-03-27 DIAGNOSIS — R413 Other amnesia: Secondary | ICD-10-CM | POA: Diagnosis not present

## 2017-04-01 DIAGNOSIS — R2243 Localized swelling, mass and lump, lower limb, bilateral: Secondary | ICD-10-CM | POA: Diagnosis not present

## 2017-04-01 DIAGNOSIS — M79604 Pain in right leg: Secondary | ICD-10-CM | POA: Diagnosis not present

## 2017-04-01 DIAGNOSIS — M79605 Pain in left leg: Secondary | ICD-10-CM | POA: Diagnosis not present

## 2017-04-01 DIAGNOSIS — M7989 Other specified soft tissue disorders: Secondary | ICD-10-CM | POA: Diagnosis not present

## 2017-04-15 DIAGNOSIS — R790 Abnormal level of blood mineral: Secondary | ICD-10-CM | POA: Diagnosis not present

## 2017-04-15 DIAGNOSIS — R799 Abnormal finding of blood chemistry, unspecified: Secondary | ICD-10-CM | POA: Diagnosis not present

## 2017-04-15 DIAGNOSIS — R2689 Other abnormalities of gait and mobility: Secondary | ICD-10-CM | POA: Diagnosis not present

## 2017-04-15 DIAGNOSIS — R7879 Finding of abnormal level of heavy metals in blood: Secondary | ICD-10-CM | POA: Diagnosis not present

## 2017-04-15 DIAGNOSIS — R209 Unspecified disturbances of skin sensation: Secondary | ICD-10-CM | POA: Diagnosis not present

## 2017-04-15 DIAGNOSIS — G2581 Restless legs syndrome: Secondary | ICD-10-CM | POA: Diagnosis not present

## 2017-04-15 DIAGNOSIS — R1084 Generalized abdominal pain: Secondary | ICD-10-CM | POA: Diagnosis not present

## 2017-04-16 DIAGNOSIS — Z9049 Acquired absence of other specified parts of digestive tract: Secondary | ICD-10-CM | POA: Diagnosis not present

## 2017-04-16 DIAGNOSIS — Z9884 Bariatric surgery status: Secondary | ICD-10-CM | POA: Diagnosis not present

## 2017-04-16 DIAGNOSIS — R109 Unspecified abdominal pain: Secondary | ICD-10-CM | POA: Diagnosis not present

## 2017-04-16 DIAGNOSIS — R1084 Generalized abdominal pain: Secondary | ICD-10-CM | POA: Diagnosis not present

## 2017-04-21 DIAGNOSIS — M5137 Other intervertebral disc degeneration, lumbosacral region: Secondary | ICD-10-CM | POA: Diagnosis not present

## 2017-04-21 DIAGNOSIS — M545 Low back pain: Secondary | ICD-10-CM | POA: Diagnosis not present

## 2017-04-21 DIAGNOSIS — M797 Fibromyalgia: Secondary | ICD-10-CM | POA: Diagnosis not present

## 2017-04-24 DIAGNOSIS — R2689 Other abnormalities of gait and mobility: Secondary | ICD-10-CM | POA: Diagnosis not present

## 2017-04-24 DIAGNOSIS — R1084 Generalized abdominal pain: Secondary | ICD-10-CM | POA: Diagnosis not present

## 2017-04-24 DIAGNOSIS — R790 Abnormal level of blood mineral: Secondary | ICD-10-CM | POA: Diagnosis not present

## 2017-04-24 DIAGNOSIS — G2581 Restless legs syndrome: Secondary | ICD-10-CM | POA: Diagnosis not present

## 2017-04-24 DIAGNOSIS — R209 Unspecified disturbances of skin sensation: Secondary | ICD-10-CM | POA: Diagnosis not present

## 2017-04-24 DIAGNOSIS — R7879 Finding of abnormal level of heavy metals in blood: Secondary | ICD-10-CM | POA: Diagnosis not present

## 2017-04-24 DIAGNOSIS — R799 Abnormal finding of blood chemistry, unspecified: Secondary | ICD-10-CM | POA: Diagnosis not present

## 2017-04-30 ENCOUNTER — Ambulatory Visit (INDEPENDENT_AMBULATORY_CARE_PROVIDER_SITE_OTHER): Payer: PPO | Admitting: *Deleted

## 2017-04-30 DIAGNOSIS — I639 Cerebral infarction, unspecified: Secondary | ICD-10-CM

## 2017-04-30 DIAGNOSIS — G43611 Persistent migraine aura with cerebral infarction, intractable, with status migrainosus: Secondary | ICD-10-CM

## 2017-04-30 MED ORDER — DIPHENHYDRAMINE HCL 50 MG/ML IJ SOLN
25.0000 mg | Freq: Once | INTRAMUSCULAR | Status: AC
  Administered 2017-04-30: 25 mg via INTRAMUSCULAR

## 2017-04-30 MED ORDER — METOCLOPRAMIDE HCL 5 MG/ML IJ SOLN
10.0000 mg | Freq: Once | INTRAVENOUS | Status: AC
  Administered 2017-04-30: 10 mg via INTRAMUSCULAR

## 2017-04-30 MED ORDER — KETOROLAC TROMETHAMINE 60 MG/2ML IM SOLN
60.0000 mg | Freq: Once | INTRAMUSCULAR | Status: AC
  Administered 2017-04-30: 60 mg via INTRAMUSCULAR

## 2017-05-02 ENCOUNTER — Telehealth: Payer: Self-pay | Admitting: Gastroenterology

## 2017-05-04 NOTE — Telephone Encounter (Signed)
That is OK with me if it is ok with Dr. Fuller Plan

## 2017-05-05 DIAGNOSIS — Z9884 Bariatric surgery status: Secondary | ICD-10-CM | POA: Diagnosis not present

## 2017-05-05 DIAGNOSIS — Z6829 Body mass index (BMI) 29.0-29.9, adult: Secondary | ICD-10-CM | POA: Diagnosis not present

## 2017-05-05 DIAGNOSIS — Z713 Dietary counseling and surveillance: Secondary | ICD-10-CM | POA: Diagnosis not present

## 2017-05-05 DIAGNOSIS — M797 Fibromyalgia: Secondary | ICD-10-CM | POA: Diagnosis not present

## 2017-05-05 DIAGNOSIS — E663 Overweight: Secondary | ICD-10-CM | POA: Diagnosis not present

## 2017-05-07 DIAGNOSIS — R7879 Finding of abnormal level of heavy metals in blood: Secondary | ICD-10-CM | POA: Diagnosis not present

## 2017-05-07 NOTE — Telephone Encounter (Signed)
OK 

## 2017-05-07 NOTE — Telephone Encounter (Signed)
Dr. Fuller Plan will you accept the switch?   Thanks!

## 2017-05-08 NOTE — Telephone Encounter (Signed)
Called patient and mailbox is full, unable to leave message.  Will try again later

## 2017-05-12 ENCOUNTER — Encounter: Payer: Self-pay | Admitting: Gastroenterology

## 2017-05-12 ENCOUNTER — Ambulatory Visit (INDEPENDENT_AMBULATORY_CARE_PROVIDER_SITE_OTHER): Payer: PPO | Admitting: Gastroenterology

## 2017-05-12 ENCOUNTER — Other Ambulatory Visit (INDEPENDENT_AMBULATORY_CARE_PROVIDER_SITE_OTHER): Payer: PPO

## 2017-05-12 VITALS — BP 108/70 | HR 88 | Ht 68.0 in | Wt 193.4 lb

## 2017-05-12 DIAGNOSIS — K5900 Constipation, unspecified: Secondary | ICD-10-CM

## 2017-05-12 DIAGNOSIS — R1084 Generalized abdominal pain: Secondary | ICD-10-CM | POA: Diagnosis not present

## 2017-05-12 DIAGNOSIS — R7989 Other specified abnormal findings of blood chemistry: Secondary | ICD-10-CM

## 2017-05-12 DIAGNOSIS — R112 Nausea with vomiting, unspecified: Secondary | ICD-10-CM

## 2017-05-12 DIAGNOSIS — R945 Abnormal results of liver function studies: Secondary | ICD-10-CM

## 2017-05-12 LAB — HEPATIC FUNCTION PANEL
ALK PHOS: 63 U/L (ref 39–117)
ALT: 49 U/L — ABNORMAL HIGH (ref 0–35)
AST: 34 U/L (ref 0–37)
Albumin: 4.2 g/dL (ref 3.5–5.2)
BILIRUBIN DIRECT: 0.1 mg/dL (ref 0.0–0.3)
Total Bilirubin: 0.3 mg/dL (ref 0.2–1.2)
Total Protein: 6.5 g/dL (ref 6.0–8.3)

## 2017-05-12 LAB — HEPATITIS C ANTIBODY: HCV AB: NEGATIVE

## 2017-05-12 LAB — PROTIME-INR
INR: 1.1 ratio — ABNORMAL HIGH (ref 0.8–1.0)
Prothrombin Time: 11.4 s (ref 9.6–13.1)

## 2017-05-12 LAB — HEPATITIS B SURFACE ANTIBODY,QUALITATIVE: HEP B S AB: POSITIVE — AB

## 2017-05-12 LAB — HEPATITIS B SURFACE ANTIGEN: Hepatitis B Surface Ag: NEGATIVE

## 2017-05-12 MED ORDER — ONDANSETRON 4 MG PO TBDP
4.0000 mg | ORAL_TABLET | Freq: Three times a day (TID) | ORAL | 1 refills | Status: DC
Start: 2017-05-12 — End: 2019-03-17

## 2017-05-12 NOTE — Patient Instructions (Addendum)
Your physician has requested that you go to the basement for lab work before leaving today.  Take you Zofran three times a day before meals.   Take Miralax mixing 17 grams in 8 oz of water 1-2 x daily in addition to your Linzess.   You have been scheduled for an endoscopy. Please follow written instructions given to you at your visit today. If you use inhalers (even only as needed), please bring them with you on the day of your procedure. Your physician has requested that you go to www.[email or web address].com and enter the access code given to you at your visit today. This web site gives a general overview about your procedure. However, you should still follow specific instructions given to you by our office regarding your preparation for the procedure.  Normal BMI (Body Mass Index- based on height and weight) is between 19 and 25. Your BMI today is Body mass index is 29.4 kg/m. Marland Kitchen Please consider follow up  regarding your BMI with your Primary Care Provider.  Thank you for choosing me and [facility].  Pricilla Riffle. Dagoberto Ligas., MD., Marval Regal

## 2017-05-12 NOTE — Progress Notes (Signed)
History of Present Illness: This is a 51 year old female former patient of Dr. Ardis Hughs who is transferring care to me. She is accompanied by her mother. Pt complains of generalized abdominal pain and frequent nausea and vomiting following meals for the past 2 months. She has noted worsening problems with constipation over the past 2 months. She started Requip a few weeks prior to all her symptoms. She notes generalized discomfort in her abdomen occasionally worse in the epigastrium. Epigastric pain is often related to her nausea and vomiting however her generalized abdominal pain does not appear to be related to bowel movements or meals. She took a strong laxative for a bowel purge following the results of her CT scan below however her abdominal pain was not improved after complete evacuation. She has noted a gradual weight loss of over the past few years. She has a history of GERD, gastric bypass, iron deficiency, IBS, fibromyalgia, migraine headaches, depression. She underwent colonoscopy and EGD by Dr. Ardis Hughs in June 2016. EGD showed mild gastritis and normal anatomy post Roux-en-Y gastric bypass. Colonoscopy was normal. Blood work from this year supplied by the patient include: copper elevated at 195 & repeat at 189, ALT=45 & prior ALT=51. Other LFTs were normal. Ferritin=9. Denies diarrhea, change in stool caliber, melena, hematochezia, dysphagia, reflux symptoms, chest pain.  04/16/2017 abd/pelvic CT scan report from [facility]:  Negative postoperative CT except for abundant stool from rectum to splenic flexure   Allergies  Allergen Reactions  . Oxycodone-Acetaminophen Hives  . Topamax [Topiramate] Shortness Of Breath    Really wierd feeling  . Sumatriptan Other (See Comments)    Difficulty breathing  . Flexeril [Cyclobenzaprine Hcl]   . Imitrex [Sumatriptan Base]   . Morphine And Related   . Percocet [Oxycodone-Acetaminophen]   . Pork (Porcine) Protein     Other reaction(s): Other  (See Comments) Migraines   Outpatient Medications Prior to Visit  Medication Sig Dispense Refill  . Ascorbic Acid (VITAMIN C) 1000 MG tablet Take 240 mg by mouth.    Marland Kitchen buPROPion (WELLBUTRIN SR) 150 MG 12 hr tablet 2 every am and 1 at bedtime    . cetirizine (ZYRTEC) 10 MG tablet Take 1 tablet (10 mg total) by mouth daily. 1 tablet 0  . clonazePAM (KLONOPIN) 1 MG tablet Take 0.25 mg by mouth at bedtime as needed.     . DULoxetine (CYMBALTA) 60 MG capsule Take 60 mg by mouth 2 (two) times daily.     Marland Kitchen ipratropium (ATROVENT) 0.03 % nasal spray Place 2 sprays into both nostrils every 12 (twelve) hours. 30 mL 12  . levothyroxine (SYNTHROID, LEVOTHROID) 88 MCG tablet Take 88 mcg by mouth daily before breakfast.    . naproxen sodium (ANAPROX DS) 550 MG tablet Take 1 tablet (550 mg total) by mouth every 12 (twelve) hours as needed. 16 tablet 3  . norethindrone-ethinyl estradiol (MICROGESTIN,JUNEL,LOESTRIN) 1-20 MG-MCG tablet 1 tablet daily.    Marland Kitchen olopatadine (PATANOL) 0.1 % ophthalmic solution Place 1 drop into both eyes daily.    Marland Kitchen omeprazole (PRILOSEC) 40 MG capsule Take 40 mg by mouth daily.    . ondansetron (ZOFRAN-ODT) 4 MG disintegrating tablet Take 1 tablet (4 mg total) by mouth every 8 (eight) hours as needed for nausea or vomiting. 20 tablet 2  . Probiotic Product (PROBIOTIC ADVANCED PO) Take 1 capsule by mouth daily.    . ranitidine (ZANTAC) 300 MG capsule Take 300 mg by mouth every evening.    . traZODone (DESYREL)  100 MG tablet Take 200 mg by mouth at bedtime.    . baclofen (LIORESAL) 10 MG tablet Take 10 mg by mouth 4 (four) times daily.     . Budesonide (RHINOCORT AQUA NA) Place 2 sprays into the nose daily.    . Coenzyme Q10 (CO Q 10 PO) Take 1 capsule by mouth daily.    Marland Kitchen DHEA 25 MG CAPS Take 1 capsule by mouth every morning.    . Diclofenac Potassium (CAMBIA) 50 MG PACK Take 1 each by mouth once as needed. 1 each 3  . Magnesium Cl-Calcium Carbonate (SLOW-MAG PO) Take 2 tablets by  mouth at bedtime.    . montelukast (SINGULAIR) 10 MG tablet Take 10 mg by mouth at bedtime.    . sennosides-docusate sodium (SENOKOT-S) 8.6-50 MG tablet Take 1 tablet by mouth daily.    Marland Kitchen topiramate (TOPAMAX) 100 MG tablet Take 1 tablet (100 mg total) by mouth 2 (two) times daily. 60 tablet 3   No facility-administered medications prior to visit.    Past Medical History:  Diagnosis Date  . Anemia   . Anxiety disorder   . Chronic headaches   . Depression   . Family history of colon cancer    mother,uncles,aunts  . Fibromyalgia   . Gallstones   . GERD (gastroesophageal reflux disease)   . Hypothyroidism   . IBS (irritable bowel syndrome)   . IBS (irritable bowel syndrome)   . Obesity   . Obesity   . Personal history of colonic polyps 04/2006   polypoid   Past Surgical History:  Procedure Laterality Date  . ANTERIOR CRUCIATE LIGAMENT REPAIR Left 2003  . APPENDECTOMY  1993  . CHOLECYSTECTOMY  1999  . COLONOSCOPY  2009   [facility]  . REPAIR ANKLE LIGAMENT Left 2001  . ROUX-EN-Y GASTRIC BYPASS  04/2004   Dr Johnathan Hausen   Social History   Social History  . Marital status: Single    Spouse name: N/A  . Number of children: 0  . Years of education: N/A   Occupational History  . RN/disabled    Social History Main Topics  . Smoking status: Never Smoker  . Smokeless tobacco: Never Used  . Alcohol use 0.0 oz/week     Comment: rarely  . Drug use: No  . Sexual activity: Not Asked   Other Topics Concern  . None   Social History Narrative  . None   Family History  Problem Relation Age of Onset  . Colon cancer Mother        uncles,aunts  . Diabetes Father        paternal grandmother,sister  . Leukemia Father   . Lymphoma Father   . Skin cancer Father   . Heart disease Father   . Colon polyps Sister        1st degree cousins  . Diabetes Sister   . Stomach cancer Neg Hx        Physical Exam: General: Well developed, well nourished, no acute distress Head:  Normocephalic and atraumatic Eyes:  sclerae anicteric, EOMI Ears: Normal auditory acuity Mouth: No deformity or lesions Lungs: Clear throughout to auscultation Heart: Regular rate and rhythm; no murmurs, rubs or bruits Abdomen: Soft, diffuse tenderness to light palpation and non distended. No masses, hepatosplenomegaly or hernias noted. Normal Bowel sounds Musculoskeletal: Symmetrical with no gross deformities  Pulses:  Normal pulses noted Extremities: No clubbing, cyanosis, edema or deformities noted Neurological: Alert oriented x 4, slow thought processes. Difficulties with memory.  Psychological:  Alert and cooperative. Depressed affect  Assessment and Recommendations:  1. Generalized abdominal pain. Symptoms are not clearly associated with any digestive function and she has diffuse tenderness to light palpation. I'm concerned about abdominal wall pain, possible fibromyalgia. Further mgmt per her PCP. Also see #2.  2. Nausea, vomiting, epigastric pain. Rule out ulcer, partial gastric outlet obstruction, gastroparesis and Requip, Cymbalta or other medication side effect. Schedule EGD. The risks (including bleeding, perforation, infection, missed lesions, medication reactions and possible hospitalization or surgery if complications occur), benefits, and alternatives to endoscopy with possible biopsy and possible dilation were discussed with the patient and they consent to proceed. If EGD is not diagnostic return to PCP to discuss discontinuing Requip and/or Cymbalta. Continue omeprazole 40 mg qam and ranitidine 300 mg hs. Change Zofran to 3 times a day before meals for now. Smaller, low fat meals with frequent snacks for now.  3. Elevated ALT. Obtain all standard serologies for chronic hepatitis evaluation. Serum copper level is usually decreased in Wilson's disease in proportion to the reduction in serum ceruloplasmin.   4. Elevated copper level. Return to her PCP for further evaluation  5.  Chronic constipation. Continue Linzess 290 mcg daily. Add MiraLAX once or twice daily titrated for least one complete bowel movement per day.  6. Personal history of adenomatous colon polyps. Family history of colon cancer. Surveillance colonoscopy recommended in June 2021.  7. Status post Roux-en-Y gastric bypass  8. Status post cholecystectomy  9. Status post appendectomy

## 2017-05-13 LAB — ANA: ANA: NEGATIVE

## 2017-05-14 ENCOUNTER — Ambulatory Visit (AMBULATORY_SURGERY_CENTER): Payer: PPO | Admitting: Gastroenterology

## 2017-05-14 ENCOUNTER — Encounter: Payer: Self-pay | Admitting: Gastroenterology

## 2017-05-14 VITALS — BP 106/62 | HR 69 | Temp 97.1°F | Resp 10 | Ht 68.0 in | Wt 193.0 lb

## 2017-05-14 DIAGNOSIS — R112 Nausea with vomiting, unspecified: Secondary | ICD-10-CM | POA: Diagnosis not present

## 2017-05-14 DIAGNOSIS — R1084 Generalized abdominal pain: Secondary | ICD-10-CM

## 2017-05-14 DIAGNOSIS — R109 Unspecified abdominal pain: Secondary | ICD-10-CM | POA: Diagnosis not present

## 2017-05-14 LAB — ANTI-SMOOTH MUSCLE ANTIBODY, IGG

## 2017-05-14 LAB — ALPHA-1-ANTITRYPSIN: A1 ANTITRYPSIN SER: 210 mg/dL — AB (ref 83–199)

## 2017-05-14 LAB — CERULOPLASMIN: Ceruloplasmin: 45 mg/dL (ref 18–53)

## 2017-05-14 LAB — MITOCHONDRIAL ANTIBODIES

## 2017-05-14 MED ORDER — SODIUM CHLORIDE 0.9 % IV SOLN
500.0000 mL | INTRAVENOUS | Status: DC
Start: 2017-05-14 — End: 2017-09-19

## 2017-05-14 NOTE — Op Note (Signed)
[place] Patient Name: Sade Hollon Procedure Date: 05/14/2017 3:17 PM MRN: [id number] Endoscopist: Ladene Artist , MD Age: 51 Referring MD:  Date of Birth: 10/31/1966 Gender: Female Account #: [id number] Procedure:                Upper GI endoscopy Indications:              Generalized abdominal pain, Persistent                            nausea/vomiting of unknown cause Medicines:                Monitored Anesthesia Care Procedure:                Pre-Anesthesia Assessment:                           - Prior to the procedure, a History and Physical                            was performed, and patient medications and                            allergies were reviewed. The patient's tolerance of                            previous anesthesia was also reviewed. The risks                            and benefits of the procedure and the sedation                            options and risks were discussed with the patient.                            All questions were answered, and informed consent                            was obtained. Prior Anticoagulants: The patient has                            taken no previous anticoagulant or antiplatelet                            agents. ASA Grade Assessment: III - A patient with                            severe systemic disease. After reviewing the risks                            and benefits, the patient was deemed in                            satisfactory condition to undergo the procedure.  After obtaining informed consent, the endoscope was                            passed under direct vision. Throughout the                            procedure, the patient's blood pressure, pulse, and                            oxygen saturations were monitored continuously. The                            Model GIF-HQ190 [id number]) scope was introduced                            through the mouth, and  advanced to the second part                            of duodenum. The upper GI endoscopy was                            accomplished without difficulty. The patient                            tolerated the procedure well. Scope In: Scope Out: Findings:                 The examined esophagus was normal.                           Evidence of a Roux-en-Y gastrojejunostomy was found                            in the gastric body. The gastrojejunal anastomosis                            was characterized by healthy appearing mucosa. This                            was widely patent and easily traversed. The                            efferent and afferent limbs were examined and                            appeared normal.                           The exam of the stomach was otherwise normal.                           The examined duodenum appeared normal.                           The examined proximal jejunum appeared normal. Complications:  No immediate complications. Estimated Blood Loss:     Estimated blood loss: none. Impression:               - Normal esophagus.                           - Roux-en-Y gastrojejunostomy with gastrojejunal                            anastomosis characterized by healthy appearing                            mucosa.                           - Normal examined duodenum and jejunum.                           - Stomach otherwise normal.                           - No specimens collected. Recommendation:           - Patient has a contact number available for                            emergencies. The signs and symptoms of potential                            delayed complications were discussed with the                            patient. Return to normal activities tomorrow.                            Written discharge instructions were provided to the                            patient.                           - Resume previous diet.                            - Continue present medications.                           - Schedule GES. Ladene Artist, MD 05/14/2017 3:31:42 PM This report has been signed electronically.

## 2017-05-14 NOTE — Progress Notes (Signed)
To recovery, report to Tyrell, RN, VSS. 

## 2017-05-14 NOTE — Patient Instructions (Signed)
Anticipate a call from Dr. Lynne Leader office to schedule a GES.  YOU HAD AN ENDOSCOPIC PROCEDURE TODAY AT [facility]:   Refer to the procedure report that was given to you for any specific questions about what was found during the examination.  If the procedure report does not answer your questions, please call your gastroenterologist to clarify.  If you requested that your care partner not be given the details of your procedure findings, then the procedure report has been included in a sealed envelope for you to review at your convenience later.  YOU SHOULD EXPECT: Some feelings of bloating in the abdomen. Passage of more gas than usual.  Walking can help get rid of the air that was put into your GI tract during the procedure and reduce the bloating. If you had a lower endoscopy (such as a colonoscopy or flexible sigmoidoscopy) you may notice spotting of blood in your stool or on the toilet paper. If you underwent a bowel prep for your procedure, you may not have a normal bowel movement for a few days.  Please Note:  You might notice some irritation and congestion in your nose or some drainage.  This is from the oxygen used during your procedure.  There is no need for concern and it should clear up in a day or so.  SYMPTOMS TO REPORT IMMEDIATELY:   Following upper endoscopy (EGD)  Vomiting of blood or coffee ground material  New chest pain or pain under the shoulder blades  Painful or persistently difficult swallowing  New shortness of breath  Fever of 100F or higher  Black, tarry-looking stools  For urgent or emergent issues, a gastroenterologist can be reached at any hour by calling [phone].   DIET:  We do recommend a small meal at first, but then you may proceed to your regular diet.  Drink plenty of fluids but you should avoid alcoholic beverages for 24 hours.  ACTIVITY:  You should plan to take it easy for the rest of today and you should NOT DRIVE or use heavy  machinery until tomorrow (because of the sedation medicines used during the test).    FOLLOW UP: Our staff will call the number listed on your records the next business day following your procedure to check on you and address any questions or concerns that you may have regarding the information given to you following your procedure. If we do not reach you, we will leave a message.  However, if you are feeling well and you are not experiencing any problems, there is no need to return our call.  We will assume that you have returned to your regular daily activities without incident.  If any biopsies were taken you will be contacted by phone or by letter within the next 1-3 weeks.  Please call us at [phone] if you have not heard about the biopsies in 3 weeks.    SIGNATURES/CONFIDENTIALITY: You and/or your care partner have signed paperwork which will be entered into your electronic medical record.  These signatures attest to the fact that that the information above on your After Visit Summary has been reviewed and is understood.  Full responsibility of the confidentiality of this discharge information lies with you and/or your care-partner.

## 2017-05-14 NOTE — Progress Notes (Signed)
Pt's states no medical or surgical changes since previsit or office visit. 

## 2017-05-15 ENCOUNTER — Telehealth: Payer: Self-pay | Admitting: *Deleted

## 2017-05-15 NOTE — Telephone Encounter (Signed)
  Follow up Call-  Call back number 05/14/2017 05/24/2015  Post procedure Call Back phone  # [phone]  Permission to leave phone message Yes Yes  Some recent data might be hidden     Patient questions:  Do you have a fever, pain , or abdominal swelling? No. Pain Score  0 *  Have you tolerated food without any problems? No, still has pain #3  Have you been able to return to your normal activities? Yes.    Do you have any questions about your discharge instructions: Diet   No. Medications  No. Follow up visit  No.  Do you have questions or concerns about your Care? No.  Actions: * If pain score is 4 or above: No action needed, pain <4.

## 2017-05-16 ENCOUNTER — Ambulatory Visit (INDEPENDENT_AMBULATORY_CARE_PROVIDER_SITE_OTHER): Payer: PPO | Admitting: Neurology

## 2017-05-16 ENCOUNTER — Other Ambulatory Visit: Payer: Self-pay

## 2017-05-16 DIAGNOSIS — R112 Nausea with vomiting, unspecified: Secondary | ICD-10-CM

## 2017-05-16 DIAGNOSIS — R1010 Upper abdominal pain, unspecified: Secondary | ICD-10-CM

## 2017-05-16 DIAGNOSIS — G43709 Chronic migraine without aura, not intractable, without status migrainosus: Secondary | ICD-10-CM

## 2017-05-16 MED ORDER — ONABOTULINUMTOXINA 100 UNITS IJ SOLR
155.0000 [IU] | Freq: Once | INTRAMUSCULAR | Status: AC
  Administered 2017-05-16: 155 [IU] via INTRAMUSCULAR

## 2017-05-16 NOTE — Progress Notes (Signed)
[facility]   Procedure Note Botox  Attending: Dr. Tomi Likens  Preoperative Diagnosis(es): Chronic migraine  Consent obtained from: The patient Benefits discussed included, but were not limited to decreased muscle tightness, increased joint range of motion, and decreased pain.  Risk discussed included, but were not limited pain and discomfort, bleeding, bruising, excessive weakness, venous thrombosis, muscle atrophy and dysphagia.  Anticipated outcomes of the procedure as well as he risks and benefits of the alternatives to the procedure, and the roles and tasks of the personnel to be involved, were discussed with the patient, and the patient consents to the procedure and agrees to proceed. A copy of the patient medication guide was given to the patient which explains the blackbox warning.  Patients identity and treatment sites confirmed Yes.  .  Details of Procedure: Skin was cleaned with alcohol. Prior to injection, the needle plunger was aspirated to make sure the needle was not within a blood vessel.  There was no blood retrieved on aspiration.    Following is a summary of the muscles injected  And the amount of Botulinum toxin used:  Dilution 200 units of Botox was reconstituted with 4 ml of preservative free normal saline. Time of reconstitution: At the time of the office visit (<30 minutes prior to injection)   Injections  155 total units of Botox was injected with a 30 gauge needle.  Injection Sites: L occipitalis: 15 units- 3 sites  R occiptalis: 15 units- 3 sites  L upper trapezius: 15 units- 3 sites R upper trapezius: 15 units- 3 sits          L paraspinal: 10 units- 2 sites R paraspinal: 10 units- 2 sites  Face L frontalis(2 injection sites):10 units   R frontalis(2 injection sites):10 units         L corrugator: 5 units   R corrugator: 5 units           Procerus: 5 units   L temporalis: 20 units R temporalis: 20 units   Agent:  200 units of botulinum Type A  (Onobotulinum Toxin type A) was reconstituted with 4 ml of preservative free normal saline.  Time of reconstitution: At the time of the office visit (<30 minutes prior to injection)     Total injected (Units): 155  Total wasted (Units): 0  Patient tolerated procedure well without complications.   Reinjection is anticipated in 3 months. Return to clinic next month.  Metta Clines, DO

## 2017-05-16 NOTE — Progress Notes (Signed)
Patient notified of the GES scheduled for 05/30/17 at 7:30.  She is notified to arrive at 7:15, be NPO after midnight, and hold all of her GI meds for 24 hours before.

## 2017-05-21 DIAGNOSIS — M545 Low back pain: Secondary | ICD-10-CM | POA: Diagnosis not present

## 2017-05-30 ENCOUNTER — Encounter (HOSPITAL_COMMUNITY): Payer: Self-pay | Admitting: Radiology

## 2017-05-30 ENCOUNTER — Other Ambulatory Visit: Payer: Self-pay

## 2017-05-30 ENCOUNTER — Encounter (HOSPITAL_COMMUNITY)
Admission: RE | Admit: 2017-05-30 | Discharge: 2017-05-30 | Disposition: A | Discharge status: Home / Self Care | Payer: PPO | Source: Ambulatory Visit | Attending: Gastroenterology | Admitting: Gastroenterology

## 2017-05-30 DIAGNOSIS — R109 Unspecified abdominal pain: Secondary | ICD-10-CM | POA: Diagnosis not present

## 2017-05-30 DIAGNOSIS — R1010 Upper abdominal pain, unspecified: Secondary | ICD-10-CM | POA: Insufficient documentation

## 2017-05-30 DIAGNOSIS — R112 Nausea with vomiting, unspecified: Secondary | ICD-10-CM | POA: Diagnosis not present

## 2017-05-30 MED ORDER — TECHNETIUM TC 99M SULFUR COLLOID
2.2000 | Freq: Once | INTRAVENOUS | Status: AC | PRN
  Administered 2017-05-30: 2.2 via INTRAVENOUS

## 2017-05-30 MED ORDER — PLECANATIDE 3 MG PO TABS: 3.0000 mg | ORAL_TABLET | Freq: Every day | ORAL | 3 refills | Status: DC

## 2017-06-02 ENCOUNTER — Telehealth: Payer: Self-pay

## 2017-06-02 NOTE — Telephone Encounter (Signed)
Initiated PA with [facility] for PA on Trulance.   Trulance was approved from 06/02/17-12/01/17.

## 2017-06-05 DIAGNOSIS — R7879 Finding of abnormal level of heavy metals in blood: Secondary | ICD-10-CM | POA: Diagnosis not present

## 2017-06-05 DIAGNOSIS — F5101 Primary insomnia: Secondary | ICD-10-CM | POA: Diagnosis not present

## 2017-06-05 DIAGNOSIS — R1084 Generalized abdominal pain: Secondary | ICD-10-CM | POA: Diagnosis not present

## 2017-06-05 DIAGNOSIS — K5901 Slow transit constipation: Secondary | ICD-10-CM | POA: Diagnosis not present

## 2017-06-05 DIAGNOSIS — R5383 Other fatigue: Secondary | ICD-10-CM | POA: Diagnosis not present

## 2017-06-05 DIAGNOSIS — E039 Hypothyroidism, unspecified: Secondary | ICD-10-CM | POA: Diagnosis not present

## 2017-06-05 DIAGNOSIS — I890 Lymphedema, not elsewhere classified: Secondary | ICD-10-CM | POA: Diagnosis not present

## 2017-06-13 ENCOUNTER — Ambulatory Visit (INDEPENDENT_AMBULATORY_CARE_PROVIDER_SITE_OTHER): Payer: PPO | Admitting: Neurology

## 2017-06-13 ENCOUNTER — Encounter: Payer: Self-pay | Admitting: Neurology

## 2017-06-13 VITALS — BP 120/80 | HR 72 | Ht 68.0 in | Wt 189.8 lb

## 2017-06-13 DIAGNOSIS — G43009 Migraine without aura, not intractable, without status migrainosus: Secondary | ICD-10-CM

## 2017-06-13 MED ORDER — KETOROLAC TROMETHAMINE 10 MG PO TABS: 10.0000 mg | ORAL_TABLET | Freq: Four times a day (QID) | ORAL | 2 refills | Status: DC | PRN

## 2017-06-13 NOTE — Progress Notes (Signed)
NEUROLOGY FOLLOW UP OFFICE NOTE  Paige [name] [id number]  HISTORY OF PRESENT ILLNESS: Paige [name] is a 51 year old right-handed woman with depression who follows up for migraine.   UPDATE: Intensity:  4/10 Duration:  6-8 hours Frequency:  4-6 days per month Current NSAIDS:  naproxen 550mg  (not too effective).  Cambia was too expensive. Current analgesics: no Current triptans:  no Current anti-emetic:  Zofran ODT 4mg  Current muscle relaxants:  baclofen 10mg  Current anti-anxiolytic:  clonazepam 1mg  Current sleep aide:  trazodone 100mg  Current Antihypertensive medications:  no Current Antidepressant medications:  Cymbalta 60mg , bupropion ER 200mg  Current Anticonvulsant medications:  no Current Vitamins/Herbal/Supplements:  B complex, C, CoQ-10 Current Antihistamines/Decongestants:  no Other therapy:  Botox (status post 4 rounds)   Caffeine:  rarely Alcohol:  no Smoker:  no Diet:  hydrates Exercise:  Not routine Depression/stress:  controlled Sleep hygiene:  fair   HISTORY:  Onset:  Since teenager.  She has migraines as well as daily headache which have gotten worse over the past 2 years. Location:  Migraines: bi-frontal/retro-orbital/temples; Daily headache:  Bi-frontal/temporal Quality:  Migraine: pounding; Daily headache: non-throbbing Initial ntensity:  Migraine: 10/10; Daily headache: 6/10 Aura:  no Prodrome:  no Associated symptoms:  Migraine: nausea, photophobia, phonophobia, osmophobia, sometimes vomiting; Daily headache: sometimes photophobia Initial Duration:  Migraine:  Several hours; Daily headache: constant Initial Frequency:  Migraine: every other week; Daily headache:  constant Triggers/exacerbating factors:  Certain scents, bright light, pork Relieving factors:  rest Activity:  Cannot function with migraine   Past NSAIDS:  Ibuprofen Past analgesics:  Percocet (hives), morphine (itching) Past abortive triptans:  sumatriptan (difficulty  breathing) Past muscle relaxants:  cyclobenzaprine (hallucinations) Past anti-emetic:  phenergan Past antihypertensive medications:  no Past antidepressant medications:  Amitriptyline, possibly venlafaxine Past anticonvulsant medications:  Depakote, topiramate 100mg  twice daily) Past vitamins/Herbal/Supplements:  no Other past therapy:  acupuncture   Family history of headache:  Paternal aunt  PAST MEDICAL HISTORY: Past Medical History:  Diagnosis Date  . Anemia   . Anxiety disorder   . Chronic headaches   . Depression   . Family history of colon cancer    mother,uncles,aunts  . Fibromyalgia   . Gallstones   . GERD (gastroesophageal reflux disease)   . Hypothyroidism   . IBS (irritable bowel syndrome)   . IBS (irritable bowel syndrome)   . Obesity   . Obesity   . Personal history of colonic polyps 04/2006   polypoid    MEDICATIONS: Current Outpatient Prescriptions on File Prior to Visit  Medication Sig Dispense Refill  . Ascorbic Acid (VITAMIN C) 1000 MG tablet Take 240 mg by mouth.    Marland Kitchen buPROPion (WELLBUTRIN SR) 150 MG 12 hr tablet 2 every am and 1 at bedtime    . cetirizine (ZYRTEC) 10 MG tablet Take 1 tablet (10 mg total) by mouth daily. 1 tablet 0  . clonazePAM (KLONOPIN) 1 MG tablet Take 0.25 mg by mouth at bedtime as needed.     . DULoxetine (CYMBALTA) 60 MG capsule Take 60 mg by mouth 2 (two) times daily.     Marland Kitchen ipratropium (ATROVENT) 0.03 % nasal spray Place 2 sprays into both nostrils every 12 (twelve) hours. 30 mL 12  . Lactobacillus (FLORAJEN ACIDOPHILUS) CAPS Take 1 tablet by mouth daily with breakfast.    . Lactobacillus (REPHRESH PRO-B PO) Take 1 tablet by mouth every morning.    Marland Kitchen levothyroxine (SYNTHROID, LEVOTHROID) 88 MCG tablet Take 88 mcg by mouth daily before breakfast.    .  olopatadine (PATANOL) 0.1 % ophthalmic solution Place 1 drop into both eyes daily.    . ondansetron (ZOFRAN-ODT) 4 MG disintegrating tablet Take 1 tablet (4 mg total) by mouth 3  (three) times daily before meals. 90 tablet 1  . Plecanatide (TRULANCE) 3 MG TABS Take 3 mg by mouth daily. 30 tablet 3  . promethazine (PHENERGAN) 25 MG tablet Take 25 mg by mouth as needed (migraines).    . Propylene Glycol 0.6 % SOLN Apply to eye as needed.    . ranitidine (ZANTAC) 300 MG capsule Take 300 mg by mouth every evening.    Marland Kitchen rOPINIRole (REQUIP) 2 MG tablet Take 2 mg by mouth 2 (two) times daily.    . traZODone (DESYREL) 100 MG tablet Take 200 mg by mouth at bedtime.    Marland Kitchen linaclotide (LINZESS) 290 MCG CAPS capsule Take 1 capsule by mouth daily.     Current Facility-Administered Medications on File Prior to Visit  Medication Dose Route Frequency Provider Last Rate Last Dose  . 0.9 %  sodium chloride infusion  500 mL Intravenous Continuous Ladene Artist, MD        ALLERGIES: Allergies  Allergen Reactions  . Oxycodone-Acetaminophen Hives  . Topamax [Topiramate] Shortness Of Breath    Really wierd feeling  . Sumatriptan Other (See Comments)    Difficulty breathing  . Flexeril [Cyclobenzaprine Hcl]   . Imitrex [Sumatriptan Base]   . Morphine And Related   . Percocet [Oxycodone-Acetaminophen]   . Pork (Porcine) Protein     Other reaction(s): Other (See Comments) Migraines    FAMILY HISTORY: Family History  Problem Relation Age of Onset  . Colon cancer Mother        uncles,aunts  . Diabetes Father        paternal grandmother,sister  . Leukemia Father   . Lymphoma Father   . Skin cancer Father   . Heart disease Father   . Colon polyps Sister        1st degree cousins  . Diabetes Sister   . Stomach cancer Neg Hx     SOCIAL HISTORY: Social History   Social History  . Marital status: Single    Spouse name: N/A  . Number of children: 0  . Years of education: N/A   Occupational History  . RN/disabled    Social History Main Topics  . Smoking status: Never Smoker  . Smokeless tobacco: Never Used  . Alcohol use 0.0 oz/week     Comment: rarely  . Drug  use: No  . Sexual activity: Not on file   Other Topics Concern  . Not on file   Social History Narrative  . No narrative on file    REVIEW OF SYSTEMS: Constitutional: No fevers, chills, or sweats, no generalized fatigue, change in appetite Eyes: No visual changes, double vision, eye pain Ear, nose and throat: No hearing loss, ear pain, nasal congestion, sore throat Cardiovascular: No chest pain, palpitations Respiratory:  No shortness of breath at rest or with exertion, wheezes GastrointestinaI: No nausea, vomiting, diarrhea, abdominal pain, fecal incontinence Genitourinary:  No dysuria, urinary retention or frequency Musculoskeletal:  No neck pain, back pain Integumentary: No rash, pruritus, skin lesions Neurological: as above Psychiatric: No depression, insomnia, anxiety Endocrine: No palpitations, fatigue, diaphoresis, mood swings, change in appetite, change in weight, increased thirst Hematologic/Lymphatic:  No purpura, petechiae. Allergic/Immunologic: no itchy/runny eyes, nasal congestion, recent allergic reactions, rashes  PHYSICAL EXAM: Vitals:   06/13/17 1051  BP: 120/80  Pulse:  72   General: No acute distress.  Patient appears well-groomed.   Head:  Normocephalic/atraumatic Eyes:  Fundi examined but not visualized Neck: supple, no paraspinal tenderness, full range of motion Heart:  Regular rate and rhythm Lungs:  Clear to auscultation bilaterally Back: No paraspinal tenderness Neurological Exam: alert and oriented to person, place, and time. Attention span and concentration intact, recent and remote memory intact, fund of knowledge intact.  Speech fluent and not dysarthric, language intact.  CN II-XII intact. Bulk and tone normal, muscle strength 5/5 throughout.  Sensation to light touch, temperature and vibration intact.  Deep tendon reflexes 2+ throughout, toes downgoing.  Finger to nose and heel to shin testing intact.  Gait normal, Romberg  negative.  IMPRESSION: Migraine, doing well on Botox (from daily headaches to 4-6 days per month)  PLAN: 1.  For abortive therapy, will try ketorolac 10mg  to take with Zofran. She had an anaphylactic reaction to sumatriptan, so I am hesitant to try another triptan 2.  Follow up for next round of Botox.  Metta Clines, DO  CC: Ernestene Kiel, MD

## 2017-06-13 NOTE — Patient Instructions (Signed)
1.  When you get a migraine, try taking ketorolac 10mg  tablet (with Zofran).  May take every 6 hours as needed.  Limit to no more than 2 days out of the week 2.  Follow up for next Botox

## 2017-06-19 DIAGNOSIS — G2581 Restless legs syndrome: Secondary | ICD-10-CM | POA: Diagnosis not present

## 2017-06-19 DIAGNOSIS — M542 Cervicalgia: Secondary | ICD-10-CM | POA: Diagnosis not present

## 2017-06-19 DIAGNOSIS — G4719 Other hypersomnia: Secondary | ICD-10-CM | POA: Diagnosis not present

## 2017-06-19 DIAGNOSIS — M5030 Other cervical disc degeneration, unspecified cervical region: Secondary | ICD-10-CM | POA: Diagnosis not present

## 2017-06-19 DIAGNOSIS — I890 Lymphedema, not elsewhere classified: Secondary | ICD-10-CM | POA: Diagnosis not present

## 2017-07-03 DIAGNOSIS — K5901 Slow transit constipation: Secondary | ICD-10-CM | POA: Diagnosis not present

## 2017-07-03 DIAGNOSIS — G4719 Other hypersomnia: Secondary | ICD-10-CM | POA: Diagnosis not present

## 2017-07-03 DIAGNOSIS — R7879 Finding of abnormal level of heavy metals in blood: Secondary | ICD-10-CM | POA: Diagnosis not present

## 2017-07-03 DIAGNOSIS — E039 Hypothyroidism, unspecified: Secondary | ICD-10-CM | POA: Diagnosis not present

## 2017-07-03 DIAGNOSIS — Z79899 Other long term (current) drug therapy: Secondary | ICD-10-CM | POA: Diagnosis not present

## 2017-07-03 DIAGNOSIS — I890 Lymphedema, not elsewhere classified: Secondary | ICD-10-CM | POA: Diagnosis not present

## 2017-07-03 DIAGNOSIS — M5030 Other cervical disc degeneration, unspecified cervical region: Secondary | ICD-10-CM | POA: Diagnosis not present

## 2017-07-03 DIAGNOSIS — G2581 Restless legs syndrome: Secondary | ICD-10-CM | POA: Diagnosis not present

## 2017-07-03 DIAGNOSIS — F322 Major depressive disorder, single episode, severe without psychotic features: Secondary | ICD-10-CM | POA: Diagnosis not present

## 2017-07-03 DIAGNOSIS — R300 Dysuria: Secondary | ICD-10-CM | POA: Diagnosis not present

## 2017-07-03 DIAGNOSIS — F5101 Primary insomnia: Secondary | ICD-10-CM | POA: Diagnosis not present

## 2017-07-03 DIAGNOSIS — E611 Iron deficiency: Secondary | ICD-10-CM | POA: Diagnosis not present

## 2017-07-25 ENCOUNTER — Other Ambulatory Visit: Payer: Self-pay | Admitting: Neurology

## 2017-07-31 DIAGNOSIS — M79641 Pain in right hand: Secondary | ICD-10-CM | POA: Diagnosis not present

## 2017-07-31 DIAGNOSIS — R209 Unspecified disturbances of skin sensation: Secondary | ICD-10-CM | POA: Diagnosis not present

## 2017-07-31 DIAGNOSIS — H04202 Unspecified epiphora, left lacrimal gland: Secondary | ICD-10-CM | POA: Diagnosis not present

## 2017-07-31 DIAGNOSIS — M7989 Other specified soft tissue disorders: Secondary | ICD-10-CM | POA: Diagnosis not present

## 2017-07-31 DIAGNOSIS — M549 Dorsalgia, unspecified: Secondary | ICD-10-CM | POA: Diagnosis not present

## 2017-07-31 DIAGNOSIS — M79642 Pain in left hand: Secondary | ICD-10-CM | POA: Diagnosis not present

## 2017-08-05 DIAGNOSIS — M79641 Pain in right hand: Secondary | ICD-10-CM | POA: Diagnosis not present

## 2017-08-05 DIAGNOSIS — G2581 Restless legs syndrome: Secondary | ICD-10-CM | POA: Diagnosis not present

## 2017-08-05 DIAGNOSIS — M79642 Pain in left hand: Secondary | ICD-10-CM | POA: Diagnosis not present

## 2017-08-11 DIAGNOSIS — M180 Bilateral primary osteoarthritis of first carpometacarpal joints: Secondary | ICD-10-CM | POA: Diagnosis not present

## 2017-08-11 DIAGNOSIS — G5603 Carpal tunnel syndrome, bilateral upper limbs: Secondary | ICD-10-CM | POA: Diagnosis not present

## 2017-08-11 DIAGNOSIS — M25532 Pain in left wrist: Secondary | ICD-10-CM | POA: Diagnosis not present

## 2017-08-11 DIAGNOSIS — R52 Pain, unspecified: Secondary | ICD-10-CM | POA: Diagnosis not present

## 2017-08-11 DIAGNOSIS — M25531 Pain in right wrist: Secondary | ICD-10-CM | POA: Diagnosis not present

## 2017-08-11 DIAGNOSIS — M542 Cervicalgia: Secondary | ICD-10-CM | POA: Diagnosis not present

## 2017-08-12 DIAGNOSIS — M79644 Pain in right finger(s): Secondary | ICD-10-CM | POA: Diagnosis not present

## 2017-08-12 DIAGNOSIS — M180 Bilateral primary osteoarthritis of first carpometacarpal joints: Secondary | ICD-10-CM | POA: Diagnosis not present

## 2017-08-12 DIAGNOSIS — G5603 Carpal tunnel syndrome, bilateral upper limbs: Secondary | ICD-10-CM | POA: Diagnosis not present

## 2017-08-13 DIAGNOSIS — G5602 Carpal tunnel syndrome, left upper limb: Secondary | ICD-10-CM | POA: Diagnosis not present

## 2017-08-13 DIAGNOSIS — G5603 Carpal tunnel syndrome, bilateral upper limbs: Secondary | ICD-10-CM | POA: Diagnosis not present

## 2017-08-13 DIAGNOSIS — M180 Bilateral primary osteoarthritis of first carpometacarpal joints: Secondary | ICD-10-CM | POA: Insufficient documentation

## 2017-08-22 ENCOUNTER — Ambulatory Visit (INDEPENDENT_AMBULATORY_CARE_PROVIDER_SITE_OTHER): Payer: PPO | Admitting: Neurology

## 2017-08-22 DIAGNOSIS — G43009 Migraine without aura, not intractable, without status migrainosus: Secondary | ICD-10-CM | POA: Diagnosis not present

## 2017-08-22 MED ORDER — ONABOTULINUMTOXINA 100 UNITS IJ SOLR
155.0000 [IU] | Freq: Once | INTRAMUSCULAR | Status: AC
  Administered 2017-08-27: 155 [IU] via INTRAMUSCULAR

## 2017-08-22 NOTE — Progress Notes (Signed)
[facility]   Procedure Note Botox  Attending: Dr. Adam [name]  Preoperative Diagnosis(es): Chronic migraine  Consent obtained from: The patient Benefits discussed included, but were not limited to decreased muscle tightness, increased joint range of motion, and decreased pain.  Risk discussed included, but were not limited pain and discomfort, bleeding, bruising, excessive weakness, venous thrombosis, muscle atrophy and dysphagia.  Anticipated outcomes of the procedure as well as he risks and benefits of the alternatives to the procedure, and the roles and tasks of the personnel to be involved, were discussed with the patient, and the patient consents to the procedure and agrees to proceed. A copy of the patient medication guide was given to the patient which explains the blackbox warning.  Patients identity and treatment sites confirmed Yes.  .  Details of Procedure: Skin was cleaned with alcohol. Prior to injection, the needle plunger was aspirated to make sure the needle was not within a blood vessel.  There was no blood retrieved on aspiration.    Following is a summary of the muscles injected  And the amount of Botulinum toxin used:  Dilution 200 units of Botox was reconstituted with 4 ml of preservative free normal saline. Time of reconstitution: At the time of the office visit (<30 minutes prior to injection)   Injections  155 total units of Botox was injected with a 30 gauge needle.  Injection Sites: L occipitalis: 15 units- 3 sites  R occiptalis: 15 units- 3 sites  L upper trapezius: 15 units- 3 sites R upper trapezius: 15 units- 3 sits          L paraspinal: 10 units- 2 sites R paraspinal: 10 units- 2 sites  Face L frontalis(2 injection sites):10 units   R frontalis(2 injection sites):10 units         L corrugator: 5 units   R corrugator: 5 units           Procerus: 5 units   L temporalis: 20 units R temporalis: 20 units   Agent:  200 units of botulinum Type  A (Onobotulinum Toxin type A) was reconstituted with 4 ml of preservative free normal saline.  Time of reconstitution: At the time of the office visit (<30 minutes prior to injection)     Total injected (Units):  155  Total wasted (Units):  10  Patient tolerated procedure well without complications.   Reinjection is anticipated in 3 months.   

## 2017-08-27 DIAGNOSIS — G43009 Migraine without aura, not intractable, without status migrainosus: Secondary | ICD-10-CM | POA: Diagnosis not present

## 2017-08-27 MED ORDER — ONABOTULINUMTOXINA 100 UNITS IJ SOLR: 155.0000 [IU] | Freq: Once | INTRAMUSCULAR | Status: DC

## 2017-08-27 NOTE — Addendum Note (Signed)
Addended by: Clois Comber on: 08/27/2017 11:38 AM   Modules accepted: Orders

## 2017-09-03 DIAGNOSIS — G5603 Carpal tunnel syndrome, bilateral upper limbs: Secondary | ICD-10-CM | POA: Diagnosis not present

## 2017-09-11 ENCOUNTER — Other Ambulatory Visit: Payer: Self-pay | Admitting: Orthopedic Surgery

## 2017-09-19 ENCOUNTER — Encounter (HOSPITAL_BASED_OUTPATIENT_CLINIC_OR_DEPARTMENT_OTHER): Payer: Self-pay | Admitting: *Deleted

## 2017-09-19 DIAGNOSIS — G5603 Carpal tunnel syndrome, bilateral upper limbs: Secondary | ICD-10-CM | POA: Diagnosis not present

## 2017-09-19 DIAGNOSIS — M180 Bilateral primary osteoarthritis of first carpometacarpal joints: Secondary | ICD-10-CM | POA: Diagnosis not present

## 2017-09-22 ENCOUNTER — Encounter: Payer: Self-pay | Admitting: Neurology

## 2017-09-22 ENCOUNTER — Ambulatory Visit (INDEPENDENT_AMBULATORY_CARE_PROVIDER_SITE_OTHER): Payer: PPO | Admitting: Neurology

## 2017-09-22 VITALS — BP 96/68 | HR 74 | Ht 69.0 in | Wt 189.4 lb

## 2017-09-22 DIAGNOSIS — G43009 Migraine without aura, not intractable, without status migrainosus: Secondary | ICD-10-CM

## 2017-09-22 NOTE — Patient Instructions (Signed)
Migraine/Headache Recommendations: 1.  Follow up for next round of Botox in December 2.  Take naproxen for acute headaches. 3.  Limit use of pain relievers to no more than 2 days out of the week.  These medications include acetaminophen, ibuprofen, triptans and narcotics.  This will help reduce risk of rebound headaches. 4.  Be aware of common food triggers such as processed sweets, processed foods with nitrites (such as deli meat, hot dogs, sausages), foods with MSG, alcohol (such as wine), chocolate, certain cheeses, certain fruits (dried fruits, bananas, some citrus fruit), vinegar, diet soda. 4.  Avoid caffeine 5.  Routine exercise 6.  Proper sleep hygiene 7.  Stay adequately hydrated with water 8.  Keep a headache diary. 9.  Maintain proper stress management. 10.  Do not skip meals. 11.  Consider supplements:  Magnesium citrate 400mg  to 600mg  daily, riboflavin 400mg , Coenzyme Q 10 100mg  three times daily

## 2017-09-22 NOTE — Progress Notes (Signed)
NEUROLOGY FOLLOW UP OFFICE NOTE  Paige [name] [id number]  HISTORY OF PRESENT ILLNESS: Paige [name] is a 51 year old right-handed woman with depression who follows up for migraine.   UPDATE: She has not had a migraine in at least 2 months.  She still has moderate headeaches. Duration:  6-8 hours Frequency:  2 to 3 days a week Current NSAIDS:  Naproxen (could not afford ketorolac 10mg ) Current analgesics: no Current triptans:  no Current anti-emetic:  Zofran ODT 4mg  Current muscle relaxants:  baclofen 10mg  Current anti-anxiolytic:  clonazepam 1mg  Current sleep aide:  trazodone 100mg  Current Antihypertensive medications:  no Current Antidepressant medications:  Cymbalta 60mg , bupropion ER 200mg  Current Anticonvulsant medications:  Gabapentin 100mg  three times daily (for carpal tunnel pain) Current Vitamins/Herbal/Supplements:  B complex, C, CoQ-10 Current Antihistamines/Decongestants:  no Other therapy:  Botox (status post 5 rounds)   Caffeine:  rarely Alcohol:  no Smoker:  no Diet:  hydrates Exercise:  Not routine Depression/stress:  controlled Sleep hygiene:  poor   HISTORY:  Onset:  Since teenager.  She has migraines as well as daily headache which have gotten worse over the past 2 years. Location:  Migraines: bi-frontal/retro-orbital/temples; Daily headache:  Bi-frontal/temporal Quality:  Migraine: pounding; Daily headache: non-throbbing Initial ntensity:  Migraine: 10/10; Daily headache: 6/10 Aura:  no Prodrome:  no Associated symptoms:  Migraine: nausea, photophobia, phonophobia, osmophobia, sometimes vomiting; Daily headache: sometimes photophobia Initial Duration:  Migraine:  Several hours; Daily headache: constant Initial Frequency:  Migraine: every other week; Daily headache:  constant Triggers/exacerbating factors:  Certain scents, bright light, pork Relieving factors:  rest Activity:  Cannot function with migraine   Past NSAIDS:  Ibuprofen,  naproxen Past analgesics:  Percocet (hives), morphine (itching) Past abortive triptans:  sumatriptan (difficulty breathing) Past muscle relaxants:  cyclobenzaprine (hallucinations) Past anti-emetic:  phenergan Past antihypertensive medications:  no Past antidepressant medications:  Amitriptyline, possibly venlafaxine Past anticonvulsant medications:  Depakote, topiramate 100mg  twice daily) Past vitamins/Herbal/Supplements:  no Other past therapy:  acupuncture   Family history of headache:  Paternal aunt  PAST MEDICAL HISTORY: Past Medical History:  Diagnosis Date  . Anemia   . Anxiety disorder   . Chronic headaches   . Depression   . Family history of colon cancer    mother,uncles,aunts  . Fibromyalgia   . Gallstones   . GERD (gastroesophageal reflux disease)   . Hypothyroidism   . IBS (irritable bowel syndrome)   . IBS (irritable bowel syndrome)   . Obesity   . Obesity   . Personal history of colonic polyps 04/2006   polypoid  . PONV (postoperative nausea and vomiting)     MEDICATIONS: Current Outpatient Prescriptions on File Prior to Visit  Medication Sig Dispense Refill  . Ascorbic Acid (VITAMIN C) 1000 MG tablet Take 240 mg by mouth.    Marland Kitchen buPROPion (WELLBUTRIN SR) 150 MG 12 hr tablet 2 every am and 1 at bedtime    . cetirizine (ZYRTEC) 10 MG tablet Take 1 tablet (10 mg total) by mouth daily. 1 tablet 0  . DULoxetine (CYMBALTA) 60 MG capsule Take 60 mg by mouth 2 (two) times daily.     Marland Kitchen gabapentin (NEURONTIN) 100 MG capsule Take 100 mg by mouth 3 (three) times daily.    Marland Kitchen ipratropium (ATROVENT) 0.03 % nasal spray Place 2 sprays into both nostrils every 12 (twelve) hours. 30 mL 12  . levothyroxine (SYNTHROID, LEVOTHROID) 88 MCG tablet Take 88 mcg by mouth daily before breakfast.    .  naproxen (NAPROSYN) 500 MG tablet TAKE 1 TABLET BY MOUTH EVERY 12 HOURS AS NEEDED 16 tablet 3  . olopatadine (PATANOL) 0.1 % ophthalmic solution Place 1 drop into both eyes daily.    Marland Kitchen  omeprazole (PRILOSEC) 20 MG capsule Take 20 mg by mouth daily.    . ondansetron (ZOFRAN-ODT) 4 MG disintegrating tablet Take 1 tablet (4 mg total) by mouth 3 (three) times daily before meals. 90 tablet 1  . ranitidine (ZANTAC) 300 MG capsule Take 300 mg by mouth every evening.    Marland Kitchen rOPINIRole (REQUIP) 2 MG tablet Take 2 mg by mouth 2 (two) times daily.    . traZODone (DESYREL) 100 MG tablet Take 200 mg by mouth at bedtime.     No current facility-administered medications on file prior to visit.     ALLERGIES: Allergies  Allergen Reactions  . Oxycodone-Acetaminophen Hives  . Topamax [Topiramate] Shortness Of Breath and Other (See Comments)    Really wierd feeling Off balance; cloudy thinking Really wierd feeling  . Sumatriptan Other (See Comments)    Difficulty breathing  . Flexeril [Cyclobenzaprine Hcl]   . Imitrex [Sumatriptan Base]   . Mobic [Meloxicam]   . Morphine And Related   . Percocet [Oxycodone-Acetaminophen]   . Pork (Porcine) Protein     Other reaction(s): Other (See Comments) Migraines    FAMILY HISTORY: Family History  Problem Relation Age of Onset  . Colon cancer Mother        uncles,aunts  . Diabetes Father        paternal grandmother,sister  . Leukemia Father   . Lymphoma Father   . Skin cancer Father   . Heart disease Father   . Colon polyps Sister        1st degree cousins  . Diabetes Sister   . Stomach cancer Neg Hx     SOCIAL HISTORY: Social History   Social History  . Marital status: Single    Spouse name: N/A  . Number of children: 0  . Years of education: N/A   Occupational History  . RN/disabled    Social History Main Topics  . Smoking status: Never Smoker  . Smokeless tobacco: Never Used  . Alcohol use 0.0 oz/week     Comment: rarely  . Drug use: No  . Sexual activity: Not on file   Other Topics Concern  . Not on file   Social History Narrative  . No narrative on file    REVIEW OF SYSTEMS: Constitutional: No fevers,  chills, or sweats, no generalized fatigue, change in appetite Eyes: No visual changes, double vision, eye pain Ear, nose and throat: No hearing loss, ear pain, nasal congestion, sore throat Cardiovascular: No chest pain, palpitations Respiratory:  No shortness of breath at rest or with exertion, wheezes GastrointestinaI: No nausea, vomiting, diarrhea, abdominal pain, fecal incontinence Genitourinary:  No dysuria, urinary retention or frequency Musculoskeletal:  No neck pain, back pain Integumentary: No rash, pruritus, skin lesions Neurological: as above Psychiatric: No depression, insomnia, anxiety Endocrine: No palpitations, fatigue, diaphoresis, mood swings, change in appetite, change in weight, increased thirst Hematologic/Lymphatic:  No purpura, petechiae. Allergic/Immunologic: no itchy/runny eyes, nasal congestion, recent allergic reactions, rashes  PHYSICAL EXAM: Vitals:   09/22/17 0910  BP: 96/68  Pulse: 74  SpO2: 98%   General: No acute distress.  Patient appears well-groomed. Head:  Normocephalic/atraumatic Eyes:  Fundi examined but not visualized Neck: supple, no paraspinal tenderness, full range of motion Heart:  Regular rate and rhythm Lungs:  Clear to auscultation bilaterally Back: No paraspinal tenderness Neurological Exam: alert and oriented to person, place, and time. Attention span and concentration intact, recent and remote memory intact, fund of knowledge intact.  Speech fluent and not dysarthric, language intact.  CN II-XII intact. Bulk and tone normal, muscle strength 5/5 throughout.  Sensation to light touch  intact.  Deep tendon reflexes 2+ throughout.  Finger to nose testing intact.  Gait normal, Romberg negative.  IMPRESSION: Migraines, improved with Botox  PLAN: 1.  Botox scheduled for December 2.  Naproxen as needed, limited to no more than 2 days out of the week 3.  Stressed lifestyle modification:  Sleep hygiene, exercise, continue hydration   Metta Clines, DO  CC:  Ernestene Kiel, MD

## 2017-09-25 ENCOUNTER — Encounter (HOSPITAL_BASED_OUTPATIENT_CLINIC_OR_DEPARTMENT_OTHER)
Admission: RE | Disposition: A | Discharge status: Home / Self Care | Payer: Self-pay | Source: Ambulatory Visit | Attending: Orthopedic Surgery

## 2017-09-25 ENCOUNTER — Encounter (HOSPITAL_BASED_OUTPATIENT_CLINIC_OR_DEPARTMENT_OTHER): Payer: Self-pay | Admitting: Anesthesiology

## 2017-09-25 ENCOUNTER — Ambulatory Visit (HOSPITAL_BASED_OUTPATIENT_CLINIC_OR_DEPARTMENT_OTHER): Payer: PPO | Admitting: Anesthesiology

## 2017-09-25 ENCOUNTER — Ambulatory Visit (HOSPITAL_BASED_OUTPATIENT_CLINIC_OR_DEPARTMENT_OTHER)
Admission: RE | Admit: 2017-09-25 | Discharge: 2017-09-25 | Disposition: A | Discharge status: Home / Self Care | Payer: PPO | Source: Ambulatory Visit | Attending: Orthopedic Surgery | Admitting: Orthopedic Surgery

## 2017-09-25 DIAGNOSIS — E039 Hypothyroidism, unspecified: Secondary | ICD-10-CM | POA: Insufficient documentation

## 2017-09-25 DIAGNOSIS — K219 Gastro-esophageal reflux disease without esophagitis: Secondary | ICD-10-CM | POA: Insufficient documentation

## 2017-09-25 DIAGNOSIS — Z9884 Bariatric surgery status: Secondary | ICD-10-CM | POA: Insufficient documentation

## 2017-09-25 DIAGNOSIS — Z79899 Other long term (current) drug therapy: Secondary | ICD-10-CM | POA: Insufficient documentation

## 2017-09-25 DIAGNOSIS — F419 Anxiety disorder, unspecified: Secondary | ICD-10-CM | POA: Insufficient documentation

## 2017-09-25 DIAGNOSIS — M797 Fibromyalgia: Secondary | ICD-10-CM | POA: Insufficient documentation

## 2017-09-25 DIAGNOSIS — Z79890 Hormone replacement therapy (postmenopausal): Secondary | ICD-10-CM | POA: Insufficient documentation

## 2017-09-25 DIAGNOSIS — G5603 Carpal tunnel syndrome, bilateral upper limbs: Secondary | ICD-10-CM | POA: Insufficient documentation

## 2017-09-25 DIAGNOSIS — E669 Obesity, unspecified: Secondary | ICD-10-CM | POA: Insufficient documentation

## 2017-09-25 DIAGNOSIS — G5602 Carpal tunnel syndrome, left upper limb: Secondary | ICD-10-CM | POA: Diagnosis not present

## 2017-09-25 DIAGNOSIS — Z791 Long term (current) use of non-steroidal anti-inflammatories (NSAID): Secondary | ICD-10-CM | POA: Diagnosis not present

## 2017-09-25 DIAGNOSIS — D509 Iron deficiency anemia, unspecified: Secondary | ICD-10-CM | POA: Diagnosis not present

## 2017-09-25 DIAGNOSIS — F329 Major depressive disorder, single episode, unspecified: Secondary | ICD-10-CM | POA: Diagnosis not present

## 2017-09-25 HISTORY — DX: Other specified postprocedural states: R11.2

## 2017-09-25 HISTORY — PX: CARPAL TUNNEL RELEASE: SHX101

## 2017-09-25 HISTORY — DX: Other specified postprocedural states: Z98.890

## 2017-09-25 SURGERY — CARPAL TUNNEL RELEASE
Anesthesia: Monitor Anesthesia Care | Site: Hand | Laterality: Left

## 2017-09-25 MED ORDER — DEXAMETHASONE SODIUM PHOSPHATE 10 MG/ML IJ SOLN
INTRAMUSCULAR | Status: AC
  Filled 2017-09-25: qty 1

## 2017-09-25 MED ORDER — HYDROCODONE-ACETAMINOPHEN 5-325 MG PO TABS: 1.0000 | ORAL_TABLET | Freq: Four times a day (QID) | ORAL | 0 refills | Status: DC | PRN

## 2017-09-25 MED ORDER — MIDAZOLAM HCL 2 MG/2ML IJ SOLN
INTRAMUSCULAR | Status: AC
  Filled 2017-09-25: qty 2

## 2017-09-25 MED ORDER — PROPOFOL 10 MG/ML IV BOLUS
INTRAVENOUS | Status: DC | PRN
  Administered 2017-09-25: 200 mg via INTRAVENOUS

## 2017-09-25 MED ORDER — FENTANYL CITRATE (PF) 100 MCG/2ML IJ SOLN
INTRAMUSCULAR | Status: DC | PRN
  Administered 2017-09-25: 100 ug via INTRAVENOUS

## 2017-09-25 MED ORDER — HYDROCODONE-ACETAMINOPHEN 5-325 MG PO TABS
ORAL_TABLET | ORAL | Status: AC
  Filled 2017-09-25: qty 1

## 2017-09-25 MED ORDER — MIDAZOLAM HCL 2 MG/2ML IJ SOLN: 1.0000 mg | INTRAMUSCULAR | Status: DC | PRN

## 2017-09-25 MED ORDER — SCOPOLAMINE 1 MG/3DAYS TD PT72: 1.0000 | MEDICATED_PATCH | Freq: Once | TRANSDERMAL | Status: DC | PRN

## 2017-09-25 MED ORDER — FENTANYL CITRATE (PF) 100 MCG/2ML IJ SOLN
INTRAMUSCULAR | Status: AC
  Filled 2017-09-25: qty 2

## 2017-09-25 MED ORDER — DEXAMETHASONE SODIUM PHOSPHATE 10 MG/ML IJ SOLN
INTRAMUSCULAR | Status: DC | PRN
  Administered 2017-09-25: 10 mg via INTRAVENOUS

## 2017-09-25 MED ORDER — CEFAZOLIN SODIUM-DEXTROSE 2-4 GM/100ML-% IV SOLN
INTRAVENOUS | Status: AC
  Filled 2017-09-25: qty 100

## 2017-09-25 MED ORDER — CHLORHEXIDINE GLUCONATE 4 % EX LIQD: 60.0000 mL | Freq: Once | CUTANEOUS | Status: DC

## 2017-09-25 MED ORDER — FENTANYL CITRATE (PF) 100 MCG/2ML IJ SOLN
25.0000 ug | INTRAMUSCULAR | Status: DC | PRN
  Administered 2017-09-25: 50 ug via INTRAVENOUS

## 2017-09-25 MED ORDER — PROPOFOL 10 MG/ML IV BOLUS
INTRAVENOUS | Status: AC
  Filled 2017-09-25: qty 20

## 2017-09-25 MED ORDER — MIDAZOLAM HCL 5 MG/5ML IJ SOLN
INTRAMUSCULAR | Status: DC | PRN
  Administered 2017-09-25: 2 mg via INTRAVENOUS

## 2017-09-25 MED ORDER — MEPERIDINE HCL 25 MG/ML IJ SOLN: 6.2500 mg | INTRAMUSCULAR | Status: DC | PRN

## 2017-09-25 MED ORDER — HYDROCODONE-ACETAMINOPHEN 5-325 MG PO TABS
1.0000 | ORAL_TABLET | Freq: Four times a day (QID) | ORAL | Status: DC | PRN
  Administered 2017-09-25: 1 via ORAL

## 2017-09-25 MED ORDER — BUPIVACAINE HCL (PF) 0.25 % IJ SOLN
INTRAMUSCULAR | Status: DC | PRN
  Administered 2017-09-25: 8 mL

## 2017-09-25 MED ORDER — ONDANSETRON HCL 4 MG/2ML IJ SOLN
INTRAMUSCULAR | Status: AC
  Filled 2017-09-25: qty 2

## 2017-09-25 MED ORDER — LIDOCAINE 2% (20 MG/ML) 5 ML SYRINGE
INTRAMUSCULAR | Status: AC
  Filled 2017-09-25: qty 5

## 2017-09-25 MED ORDER — SCOPOLAMINE 1 MG/3DAYS TD PT72
MEDICATED_PATCH | TRANSDERMAL | Status: AC
  Filled 2017-09-25: qty 1

## 2017-09-25 MED ORDER — LACTATED RINGERS IV SOLN
INTRAVENOUS | Status: DC
  Administered 2017-09-25: 11:00:00 via INTRAVENOUS

## 2017-09-25 MED ORDER — ONDANSETRON HCL 4 MG/2ML IJ SOLN: 4.0000 mg | Freq: Once | INTRAMUSCULAR | Status: DC | PRN

## 2017-09-25 MED ORDER — CEFAZOLIN SODIUM-DEXTROSE 2-4 GM/100ML-% IV SOLN
2.0000 g | INTRAVENOUS | Status: AC
  Administered 2017-09-25: 2 g via INTRAVENOUS

## 2017-09-25 MED ORDER — FENTANYL CITRATE (PF) 100 MCG/2ML IJ SOLN
50.0000 ug | INTRAMUSCULAR | Status: DC | PRN
  Administered 2017-09-25: 50 ug via INTRAVENOUS

## 2017-09-25 SURGICAL SUPPLY — 37 items
BLADE SURG 15 STRL LF DISP TIS (BLADE) ×1 IMPLANT
BLADE SURG 15 STRL SS (BLADE) ×2
BNDG CMPR 9X4 STRL LF SNTH (GAUZE/BANDAGES/DRESSINGS) ×1
BNDG COHESIVE 3X5 TAN STRL LF (GAUZE/BANDAGES/DRESSINGS) ×2 IMPLANT
BNDG ESMARK 4X9 LF (GAUZE/BANDAGES/DRESSINGS) ×1 IMPLANT
BNDG GAUZE ELAST 4 BULKY (GAUZE/BANDAGES/DRESSINGS) ×2 IMPLANT
CHLORAPREP W/TINT 26ML (MISCELLANEOUS) ×2 IMPLANT
CORD BIPOLAR FORCEPS 12FT (ELECTRODE) ×2 IMPLANT
COVER BACK TABLE 60X90IN (DRAPES) ×2 IMPLANT
COVER MAYO STAND STRL (DRAPES) ×2 IMPLANT
CUFF TOURNIQUET SINGLE 18IN (TOURNIQUET CUFF) ×2 IMPLANT
DRAPE EXTREMITY T 121X128X90 (DRAPE) ×2 IMPLANT
DRAPE SURG 17X23 STRL (DRAPES) ×2 IMPLANT
DRSG PAD ABDOMINAL 8X10 ST (GAUZE/BANDAGES/DRESSINGS) ×2 IMPLANT
GAUZE SPONGE 4X4 12PLY STRL (GAUZE/BANDAGES/DRESSINGS) ×2 IMPLANT
GAUZE XEROFORM 1X8 LF (GAUZE/BANDAGES/DRESSINGS) ×2 IMPLANT
GLOVE BIOGEL PI IND STRL 7.0 (GLOVE) IMPLANT
GLOVE BIOGEL PI IND STRL 8.5 (GLOVE) ×1 IMPLANT
GLOVE BIOGEL PI INDICATOR 7.0 (GLOVE) ×2
GLOVE BIOGEL PI INDICATOR 8.5 (GLOVE) ×1
GLOVE ECLIPSE 6.5 STRL STRAW (GLOVE) ×1 IMPLANT
GLOVE SURG ORTHO 8.0 STRL STRW (GLOVE) ×2 IMPLANT
GOWN STRL REUS W/ TWL LRG LVL3 (GOWN DISPOSABLE) ×1 IMPLANT
GOWN STRL REUS W/TWL LRG LVL3 (GOWN DISPOSABLE) ×2
GOWN STRL REUS W/TWL XL LVL3 (GOWN DISPOSABLE) ×2 IMPLANT
NDL PRECISIONGLIDE 27X1.5 (NEEDLE) IMPLANT
NEEDLE PRECISIONGLIDE 27X1.5 (NEEDLE) ×2 IMPLANT
NS IRRIG 1000ML POUR BTL (IV SOLUTION) ×2 IMPLANT
PACK BASIN DAY SURGERY FS (CUSTOM PROCEDURE TRAY) ×2 IMPLANT
SLEEVE SCD COMPRESS KNEE MED (MISCELLANEOUS) ×1 IMPLANT
STOCKINETTE 4X48 STRL (DRAPES) ×2 IMPLANT
SUT ETHILON 4 0 PS 2 18 (SUTURE) ×2 IMPLANT
SUT VICRYL 4-0 PS2 18IN ABS (SUTURE) IMPLANT
SYR BULB 3OZ (MISCELLANEOUS) ×2 IMPLANT
SYR CONTROL 10ML LL (SYRINGE) ×1 IMPLANT
TOWEL OR 17X24 6PK STRL BLUE (TOWEL DISPOSABLE) ×2 IMPLANT
UNDERPAD 30X30 (UNDERPADS AND DIAPERS) ×1 IMPLANT

## 2017-09-25 NOTE — Anesthesia Preprocedure Evaluation (Addendum)
Anesthesia Evaluation  Patient identified by MRN, date of birth, ID band Patient awake    Reviewed: Allergy & Precautions, NPO status , Patient's Chart, lab work & pertinent test results  History of Anesthesia Complications (+) PONV and history of anesthetic complications  Airway Mallampati: II  TM Distance: >3 FB Neck ROM: Full    Dental no notable dental hx.    Pulmonary neg pulmonary ROS,    Pulmonary exam normal breath sounds clear to auscultation       Cardiovascular negative cardio ROS Normal cardiovascular exam Rhythm:Regular Rate:Normal     Neuro/Psych  Headaches, PSYCHIATRIC DISORDERS Depression negative neurological ROS  negative psych ROS   GI/Hepatic negative GI ROS, Neg liver ROS, GERD  ,  Endo/Other  negative endocrine ROSHypothyroidism   Renal/GU negative Renal ROS  negative genitourinary   Musculoskeletal negative musculoskeletal ROS (+) Arthritis , Osteoarthritis,  Fibromyalgia -  Abdominal   Peds negative pediatric ROS (+)  Hematology negative hematology ROS (+)   Anesthesia Other Findings   Reproductive/Obstetrics negative OB ROS                             Anesthesia Physical Anesthesia Plan  ASA: III  Anesthesia Plan: MAC and Bier Block   Post-op Pain Management:    Induction: Intravenous  PONV Risk Score and Plan: 3 and Ondansetron, Dexamethasone, Midazolam, Propofol infusion, Treatment may vary due to age or medical condition and Scopolamine patch - Pre-op  Airway Management Planned: Mask, Natural Airway and Nasal Cannula  Additional Equipment:   Intra-op Plan:   Post-operative Plan:   Informed Consent:   Plan Discussed with:   Anesthesia Plan Comments:         Anesthesia Quick Evaluation
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
## 2017-09-25 NOTE — Anesthesia Postprocedure Evaluation (Signed)
Anesthesia Post Note  Patient: Paige [name]  Procedure(s) Performed: LEFT CARPAL TUNNEL RELEASE (Left Hand)     Patient location during evaluation: PACU Anesthesia Type: MAC Level of consciousness: awake and alert Pain management: pain level controlled Vital Signs Assessment: post-procedure vital signs reviewed and stable Respiratory status: spontaneous breathing, nonlabored ventilation, respiratory function stable and patient connected to nasal cannula oxygen Cardiovascular status: stable and blood pressure returned to baseline Postop Assessment: no apparent nausea or vomiting Anesthetic complications: no    Last Vitals:  Vitals:   09/25/17 1250 09/25/17 1300  BP:  106/64  Pulse: 66 (!) 56  Resp:  15  Temp:    SpO2:  97%    Last Pain:  Vitals:   09/25/17 1250  TempSrc:   PainSc: 8                  [name]

## 2017-09-25 NOTE — Op Note (Signed)
[name], Paige [name]           ACCOUNT NO.:  [id number]  MEDICAL RECORD NO.:  [id number]  LOCATION:                                 FACILITY:  PHYSICIAN:  Daryll Brod, M.D.            DATE OF BIRTH:  DATE OF PROCEDURE:  09/25/2017 DATE OF DISCHARGE:                              OPERATIVE REPORT   PREOPERATIVE DIAGNOSIS:  Carpal tunnel syndrome, left hand.  POSTOPERATIVE DIAGNOSIS:  Carpal tunnel syndrome, left hand.  OPERATION:  Decompression, left median nerve.  SURGEON:  Daryll Brod, M.D.  ANESTHESIA:  General plus local infiltration.  PLACE OF SURGERY:  [facility].  ANESTHESIOLOGIST:  [facility].  HISTORY:  The patient is a 51 year old female with a history of numbness and tingling of her left hand, nerve conductions are positive.  This has not responded to conservative treatment.  She has elected to undergo surgical decompression to the median nerve left side.  Pre, peri, and postoperative course have been discussed along with risks and complications.  She is aware that there is no guarantee to the surgery; the possibility of infection; recurrence of injury to arteries, nerves, tendons; incomplete relief of symptoms; and dystrophy.  In preoperative area, the patient was seen, the extremity marked by both the patient and surgeon.  Antibiotic given.  PROCEDURE IN DETAIL:  The patient was brought to the operating room, where a general anesthetic was carried out without difficulty.  She was prepped using ChloraPrep in a supine position with the left arm free.  A 3-minute dry time was allowed and time-out taken, confirming the patient and procedure.  Longitudinal incision was made in the left palm carried down through subcutaneous tissue.  Bleeders were electrocauterized with bipolar.  The palmar fascia was split.  The superficial palmar arch was identified.  The flexor tendon of the ring and little finger was identified.  Retractors were placed sweeping the median  nerve flexor tendons radially and ulnar nerve ulnarly.  Flexor retinaculum was then incised on its ulnar border.  A right angle and Sewell retractor were placed between skin and forearm fascia.  Deep tissues were bluntly dissected free from the forearm fascia.  The forearm fascia was released for approximately 3 cm proximal to the wrist crease under direct vision. The canal was explored.  An area of compression to the nerve was apparent.  Motor branch entered into muscle distally.  The wound was copiously irrigated with saline.  The skin was then closed with interrupted 4-0 nylon sutures.  A local infiltration with 0.25% bupivacaine without epinephrine was given.  Approximately 8 mL was used. A sterile compressive dressing with the fingers free was applied.  On deflation of the tourniquet, all fingers immediately pinked.  She was taken to the recovery room for observation in satisfactory condition.  She will be discharged to home to return to the [facility] in 1 week, on [place].          ______________________________ Daryll Brod, M.D.     GK/MEDQ  D:  09/25/2017  T:  09/25/2017  Job:  [id number]

## 2017-09-25 NOTE — Op Note (Signed)
Dictation Number [id number]

## 2017-09-25 NOTE — Transfer of Care (Signed)
Immediate Anesthesia Transfer of Care Note  Patient: Paige [name]  Procedure(s) Performed: LEFT CARPAL TUNNEL RELEASE (Left Hand)  Patient Location: PACU  Anesthesia Type:General  Level of Consciousness: awake and patient cooperative  Airway & Oxygen Therapy: Patient Spontanous Breathing and Patient connected to face mask oxygen  Post-op Assessment: Report given to RN and Post -op Vital signs reviewed and stable  Post vital signs: Reviewed and stable  Last Vitals:  Vitals:   09/25/17 1034 09/25/17 1239  BP: 113/64   Pulse: 69 (P) 74  Resp: 20 (P) 12  Temp: 36.8 C (P) 36.7 C  SpO2: 100% (P) 100%    Last Pain:  Vitals:   09/25/17 1034  TempSrc: Oral  PainSc: 5          Complications: No apparent anesthesia complications

## 2017-09-25 NOTE — Anesthesia Procedure Notes (Signed)
Procedure Name: LMA Insertion Date/Time: 09/25/2017 12:12 PM Performed by: Toula Moos L Pre-anesthesia Checklist: Patient identified, Emergency Drugs available, Suction available, Patient being monitored and Timeout performed Patient Re-evaluated:Patient Re-evaluated prior to induction Oxygen Delivery Method: Circle system utilized Preoxygenation: Pre-oxygenation with 100% oxygen Induction Type: IV induction Ventilation: Mask ventilation without difficulty LMA: LMA inserted LMA Size: 4.0 Number of attempts: 1 Airway Equipment and Method: Bite block Placement Confirmation: positive ETCO2 Tube secured with: Tape Dental Injury: Teeth and Oropharynx as per pre-operative assessment

## 2017-09-25 NOTE — Brief Op Note (Signed)
09/25/2017  12:36 PM  PATIENT:  Paige [name]  51 y.o. female  PRE-OPERATIVE DIAGNOSIS:  left carpal tunnel syndrome  G56.02  POST-OPERATIVE DIAGNOSIS:  left carpal tunnel syndrome  G56.02  PROCEDURE:  Procedure(s): LEFT CARPAL TUNNEL RELEASE (Left)  SURGEON:  Surgeon(s) and Role:    Daryll Brod, MD - Primary  PHYSICIAN ASSISTANT:   ASSISTANTS: none   ANESTHESIA:   local and general  EBL:  2 mL   BLOOD ADMINISTERED:none  DRAINS: none   LOCAL MEDICATIONS USED:  BUPIVICAINE   SPECIMEN:  No Specimen  DISPOSITION OF SPECIMEN:  N/A  COUNTS:  YES  TOURNIQUET:   Total Tourniquet Time Documented: Forearm (Left) - 10 minutes Total: Forearm (Left) - 10 minutes   DICTATION: .Other Dictation: Dictation Number [id number]  PLAN OF CARE: Discharge to home after PACU  PATIENT DISPOSITION:  PACU - hemodynamically stable.

## 2017-09-25 NOTE — Discharge Instructions (Signed)

## 2017-09-26 ENCOUNTER — Encounter (HOSPITAL_BASED_OUTPATIENT_CLINIC_OR_DEPARTMENT_OTHER): Payer: Self-pay | Admitting: Orthopedic Surgery

## 2017-10-15 DIAGNOSIS — F332 Major depressive disorder, recurrent severe without psychotic features: Secondary | ICD-10-CM | POA: Diagnosis not present

## 2017-10-15 DIAGNOSIS — Z23 Encounter for immunization: Secondary | ICD-10-CM | POA: Diagnosis not present

## 2017-10-15 DIAGNOSIS — F322 Major depressive disorder, single episode, severe without psychotic features: Secondary | ICD-10-CM | POA: Diagnosis not present

## 2017-10-15 DIAGNOSIS — R42 Dizziness and giddiness: Secondary | ICD-10-CM | POA: Diagnosis not present

## 2017-10-15 DIAGNOSIS — Z79899 Other long term (current) drug therapy: Secondary | ICD-10-CM | POA: Diagnosis not present

## 2017-10-15 DIAGNOSIS — R413 Other amnesia: Secondary | ICD-10-CM | POA: Diagnosis not present

## 2017-10-15 DIAGNOSIS — I890 Lymphedema, not elsewhere classified: Secondary | ICD-10-CM | POA: Diagnosis not present

## 2017-10-15 DIAGNOSIS — L987 Excessive and redundant skin and subcutaneous tissue: Secondary | ICD-10-CM | POA: Diagnosis not present

## 2017-10-27 DIAGNOSIS — G5603 Carpal tunnel syndrome, bilateral upper limbs: Secondary | ICD-10-CM | POA: Diagnosis not present

## 2017-10-27 DIAGNOSIS — M79642 Pain in left hand: Secondary | ICD-10-CM | POA: Diagnosis not present

## 2017-10-27 DIAGNOSIS — M25642 Stiffness of left hand, not elsewhere classified: Secondary | ICD-10-CM | POA: Diagnosis not present

## 2017-10-27 DIAGNOSIS — G5602 Carpal tunnel syndrome, left upper limb: Secondary | ICD-10-CM | POA: Diagnosis not present

## 2017-10-27 DIAGNOSIS — R29898 Other symptoms and signs involving the musculoskeletal system: Secondary | ICD-10-CM | POA: Diagnosis not present

## 2017-11-19 DIAGNOSIS — F332 Major depressive disorder, recurrent severe without psychotic features: Secondary | ICD-10-CM | POA: Diagnosis not present

## 2017-11-21 ENCOUNTER — Ambulatory Visit: Payer: PPO | Admitting: Neurology

## 2017-11-26 ENCOUNTER — Ambulatory Visit (INDEPENDENT_AMBULATORY_CARE_PROVIDER_SITE_OTHER): Payer: PPO | Admitting: Neurology

## 2017-11-26 DIAGNOSIS — IMO0002 Reserved for concepts with insufficient information to code with codable children: Secondary | ICD-10-CM

## 2017-11-26 DIAGNOSIS — G43709 Chronic migraine without aura, not intractable, without status migrainosus: Secondary | ICD-10-CM

## 2017-11-26 MED ORDER — ONABOTULINUMTOXINA 100 UNITS IJ SOLR
155.0000 [IU] | Freq: Once | INTRAMUSCULAR | Status: AC
  Administered 2017-11-26: 155 [IU] via INTRAMUSCULAR

## 2017-11-26 NOTE — Progress Notes (Signed)
[facility]   Procedure Note Botox  Attending: Dr. Adam [name]  Preoperative Diagnosis(es): Chronic migraine  Consent obtained from: The patient Benefits discussed included, but were not limited to decreased muscle tightness, increased joint range of motion, and decreased pain.  Risk discussed included, but were not limited pain and discomfort, bleeding, bruising, excessive weakness, venous thrombosis, muscle atrophy and dysphagia.  Anticipated outcomes of the procedure as well as he risks and benefits of the alternatives to the procedure, and the roles and tasks of the personnel to be involved, were discussed with the patient, and the patient consents to the procedure and agrees to proceed. A copy of the patient medication guide was given to the patient which explains the blackbox warning.  Patients identity and treatment sites confirmed Yes.  .  Details of Procedure: Skin was cleaned with alcohol. Prior to injection, the needle plunger was aspirated to make sure the needle was not within a blood vessel.  There was no blood retrieved on aspiration.    Following is a summary of the muscles injected  And the amount of Botulinum toxin used:  Dilution 200 units of Botox was reconstituted with 4 ml of preservative free normal saline. Time of reconstitution: At the time of the office visit (<30 minutes prior to injection)   Injections  155 total units of Botox was injected with a 30 gauge needle.  Injection Sites: L occipitalis: 15 units- 3 sites  R occiptalis: 15 units- 3 sites  L upper trapezius: 15 units- 3 sites R upper trapezius: 15 units- 3 sits          L paraspinal: 10 units- 2 sites R paraspinal: 10 units- 2 sites  Face L frontalis(2 injection sites):10 units   R frontalis(2 injection sites):10 units         L corrugator: 5 units   R corrugator: 5 units           Procerus: 5 units   L temporalis: 20 units R temporalis: 20 units   Agent:  200 units of botulinum Type  A (Onobotulinum Toxin type A) was reconstituted with 4 ml of preservative free normal saline.  Time of reconstitution: At the time of the office visit (<30 minutes prior to injection)     Total injected (Units): 155  Total wasted (Units): 45  Patient tolerated procedure well without complications.   Reinjection is anticipated in 3 months. Return to clinic in 4 1/2 months.   

## 2017-12-04 DIAGNOSIS — H2513 Age-related nuclear cataract, bilateral: Secondary | ICD-10-CM | POA: Diagnosis not present

## 2017-12-04 DIAGNOSIS — H04123 Dry eye syndrome of bilateral lacrimal glands: Secondary | ICD-10-CM | POA: Diagnosis not present

## 2017-12-04 DIAGNOSIS — H1131 Conjunctival hemorrhage, right eye: Secondary | ICD-10-CM | POA: Diagnosis not present

## 2017-12-04 DIAGNOSIS — H25013 Cortical age-related cataract, bilateral: Secondary | ICD-10-CM | POA: Diagnosis not present

## 2017-12-09 DIAGNOSIS — G5603 Carpal tunnel syndrome, bilateral upper limbs: Secondary | ICD-10-CM | POA: Diagnosis not present

## 2017-12-09 DIAGNOSIS — R2990 Unspecified symptoms and signs involving the nervous system: Secondary | ICD-10-CM | POA: Diagnosis not present

## 2017-12-09 DIAGNOSIS — M791 Myalgia, unspecified site: Secondary | ICD-10-CM | POA: Diagnosis not present

## 2017-12-09 DIAGNOSIS — M25511 Pain in right shoulder: Secondary | ICD-10-CM | POA: Diagnosis not present

## 2017-12-09 DIAGNOSIS — M542 Cervicalgia: Secondary | ICD-10-CM | POA: Diagnosis not present

## 2017-12-09 DIAGNOSIS — R29898 Other symptoms and signs involving the musculoskeletal system: Secondary | ICD-10-CM | POA: Diagnosis not present

## 2017-12-16 ENCOUNTER — Ambulatory Visit (INDEPENDENT_AMBULATORY_CARE_PROVIDER_SITE_OTHER): Payer: PPO

## 2017-12-16 ENCOUNTER — Ambulatory Visit (INDEPENDENT_AMBULATORY_CARE_PROVIDER_SITE_OTHER): Payer: PPO | Admitting: Orthopaedic Surgery

## 2017-12-16 ENCOUNTER — Encounter (INDEPENDENT_AMBULATORY_CARE_PROVIDER_SITE_OTHER): Payer: Self-pay | Admitting: Orthopaedic Surgery

## 2017-12-16 VITALS — BP 106/68 | HR 72 | Ht 69.0 in | Wt 187.0 lb

## 2017-12-16 DIAGNOSIS — M542 Cervicalgia: Secondary | ICD-10-CM

## 2017-12-16 DIAGNOSIS — M47812 Spondylosis without myelopathy or radiculopathy, cervical region: Secondary | ICD-10-CM | POA: Diagnosis not present

## 2017-12-16 NOTE — Addendum Note (Signed)
Addended by: Meyer Cory on: 12/16/2017 12:44 PM   Modules accepted: Orders
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
## 2017-12-20 DIAGNOSIS — M542 Cervicalgia: Secondary | ICD-10-CM | POA: Diagnosis not present

## 2017-12-20 DIAGNOSIS — M47812 Spondylosis without myelopathy or radiculopathy, cervical region: Secondary | ICD-10-CM | POA: Diagnosis not present

## 2017-12-20 DIAGNOSIS — M50323 Other cervical disc degeneration at C6-C7 level: Secondary | ICD-10-CM | POA: Diagnosis not present

## 2017-12-20 DIAGNOSIS — M50322 Other cervical disc degeneration at C5-C6 level: Secondary | ICD-10-CM | POA: Diagnosis not present

## 2017-12-24 ENCOUNTER — Ambulatory Visit: Payer: PPO | Admitting: Neurology

## 2017-12-30 ENCOUNTER — Ambulatory Visit (INDEPENDENT_AMBULATORY_CARE_PROVIDER_SITE_OTHER): Payer: PPO | Admitting: Orthopaedic Surgery

## 2017-12-30 ENCOUNTER — Encounter (INDEPENDENT_AMBULATORY_CARE_PROVIDER_SITE_OTHER): Payer: Self-pay | Admitting: Orthopaedic Surgery

## 2017-12-30 VITALS — BP 117/77 | HR 72

## 2017-12-30 DIAGNOSIS — M542 Cervicalgia: Secondary | ICD-10-CM | POA: Diagnosis not present

## 2017-12-30 DIAGNOSIS — M25511 Pain in right shoulder: Secondary | ICD-10-CM

## 2017-12-30 NOTE — Progress Notes (Signed)
Office Visit Note   Patient: Paige [name]           Date of Birth: [birth year]           MRN: [id number] Visit Date: 12/30/2017              Requested by: Ernestene Kiel, MD [address] [place] [place], [place] [address] PCP: Ernestene Kiel, MD   Assessment & Plan: Visit Diagnoses:  1. Cervicalgia   2. Right shoulder pain, unspecified chronicity     Plan: MRI scan is reviewed with the patient as well as report.  No indications for surgical treatment of the cervical spine at this point.  Follow-up in 4 months for recheck.  Follow-Up Instructions: Return in about 4 months (around 04/29/2018).   Orders:  Orders Placed This Encounter  Procedures  . Ambulatory referral to Physical Therapy   No orders of the defined types were placed in this encounter.     Procedures: No procedures performed   Clinical Data: No additional findings.   Subjective: Chief Complaint  Patient presents with  . Neck - Pain    HPI-year-old female returns with ongoing problems with neck pain.  Cervical MRI obtained and is available for review.  Review of Systems 14 point review of systems is updated previous left carpal tunnel release.   Objective: Vital Signs: BP 117/77   Pulse 72   Physical Exam  Constitutional: She is oriented to person, place, and time. She appears well-developed.  HENT:  Head: Normocephalic.  Right Ear: External ear normal.  Left Ear: External ear normal.  Eyes: Pupils are equal, round, and reactive to light.  Neck: No tracheal deviation present. No thyromegaly present.  Cardiovascular: Normal rate.  Pulmonary/Chest: Effort normal.  Abdominal: Soft.  Neurological: She is alert and oriented to person, place, and time.  Skin: Skin is warm and dry.  Psychiatric: She has a normal mood and affect. Her behavior is normal.    Ortho Exam patient has tenderness plexus trochanters.  Lateral epicondyles are tender.  Border of the scapula spine inferiorly and along  the spine is tender.  Lateral epicondyles are tender.  Well-healed left carpal tunnel release incision.  Compression of the cervical spine.  Specialty Comments:  No specialty comments available.  Imaging: Cervical MRI scan 12/20/2017 is available and reviewed with patient.  There is mild disc degeneration at C5-6 and C6-7 negative for spinal stenosis or foraminal stenosis.    PMFS History: Patient Active Problem List   Diagnosis Date Noted  . Cervicalgia 02/12/2018  . Cough 05/23/2016  . Dyspnea 05/23/2016  . Chronic pain 05/22/2016  . Anemia, iron deficiency 08/24/2015  . Fibrositis 08/17/2015  . Abnormal weight gain 08/17/2015  . Anxiety 08/17/2015  . Recurrent major depressive episodes ([place]) 08/17/2015  . History of migraine headaches 08/17/2015  . Neurosis, posttraumatic 08/17/2015  . Bariatric surgery status 08/17/2015  . DDD (degenerative disc disease), lumbar 01/18/2015  . Facet joint syndrome ([place]) 01/18/2015  . Degenerative arthritis of thoracic spine 01/18/2015  . Myofascial pain 02/22/2013  . Back ache 07/09/2012  . Family history of malignant neoplasm of gastrointestinal tract 06/10/2011  . Personal history of colonic polyps 06/10/2011  . Esophageal reflux 06/10/2011  . Cellulitis 06/10/2011  . Status post gastric bypass for obesity 06/10/2011  . B12 nutritional deficiency 06/10/2011  . Obesity 06/10/2011  . Personal history of fibromyalgia 06/10/2011  . Depression, major, recurrent, in partial remission ([place]) 06/10/2011  . Acid reflux 06/10/2011  Past Medical History:  Diagnosis Date  . Anemia   . Anxiety disorder   . Chronic headaches   . Depression   . Family history of colon cancer    mother,uncles,aunts  . Fibromyalgia   . Gallstones   . GERD (gastroesophageal reflux disease)   . Hypothyroidism   . IBS (irritable bowel syndrome)   . IBS (irritable bowel syndrome)   . Obesity   . Obesity   . Personal history of colonic polyps 04/2006   polypoid    . PONV (postoperative nausea and vomiting)     Family History  Problem Relation Age of Onset  . Colon cancer Mother        uncles,aunts  . Diabetes Father        paternal grandmother,sister  . Leukemia Father   . Lymphoma Father   . Skin cancer Father   . Heart disease Father   . Colon polyps Sister        1st degree cousins  . Diabetes Sister   . Stomach cancer Neg Hx     Past Surgical History:  Procedure Laterality Date  . ANTERIOR CRUCIATE LIGAMENT REPAIR Left 2003  . APPENDECTOMY  1993  . CARPAL TUNNEL RELEASE Left 09/25/2017   Procedure: LEFT CARPAL TUNNEL RELEASE;  Surgeon: Daryll Brod, MD;  Location: [facility];  Service: Orthopedics;  Laterality: Left;  . CHOLECYSTECTOMY  1999  . COLONOSCOPY  2009   [facility]  . REPAIR ANKLE LIGAMENT Left 2001  . ROUX-EN-Y GASTRIC BYPASS  04/2004   Dr Johnathan Hausen   Social History   Occupational History  . Occupation: RN/disabled  Tobacco Use  . Smoking status: Never Smoker  . Smokeless tobacco: Never Used  Substance and Sexual Activity  . Alcohol use: Yes    Alcohol/week: 0.0 oz    Comment: rarely  . Drug use: No  . Sexual activity: Not on file

## 2017-12-31 DIAGNOSIS — M797 Fibromyalgia: Secondary | ICD-10-CM | POA: Diagnosis not present

## 2018-01-02 DIAGNOSIS — M2560 Stiffness of unspecified joint, not elsewhere classified: Secondary | ICD-10-CM | POA: Diagnosis not present

## 2018-01-02 DIAGNOSIS — M6281 Muscle weakness (generalized): Secondary | ICD-10-CM | POA: Diagnosis not present

## 2018-01-02 DIAGNOSIS — M542 Cervicalgia: Secondary | ICD-10-CM | POA: Diagnosis not present

## 2018-01-02 DIAGNOSIS — M25511 Pain in right shoulder: Secondary | ICD-10-CM | POA: Diagnosis not present

## 2018-01-02 DIAGNOSIS — G5603 Carpal tunnel syndrome, bilateral upper limbs: Secondary | ICD-10-CM | POA: Diagnosis not present

## 2018-01-05 ENCOUNTER — Encounter: Payer: PPO | Admitting: Gastroenterology

## 2018-01-07 ENCOUNTER — Telehealth (INDEPENDENT_AMBULATORY_CARE_PROVIDER_SITE_OTHER): Payer: Self-pay | Admitting: Orthopaedic Surgery

## 2018-01-07 DIAGNOSIS — F332 Major depressive disorder, recurrent severe without psychotic features: Secondary | ICD-10-CM | POA: Diagnosis not present

## 2018-01-07 NOTE — Telephone Encounter (Signed)
12/16/2017 OV NOTE FAXED TO [facility] [phone]

## 2018-01-16 DIAGNOSIS — Z1231 Encounter for screening mammogram for malignant neoplasm of breast: Secondary | ICD-10-CM | POA: Diagnosis not present

## 2018-01-27 DIAGNOSIS — M5432 Sciatica, left side: Secondary | ICD-10-CM | POA: Diagnosis not present

## 2018-01-30 DIAGNOSIS — R413 Other amnesia: Secondary | ICD-10-CM | POA: Diagnosis not present

## 2018-01-30 DIAGNOSIS — R5383 Other fatigue: Secondary | ICD-10-CM | POA: Diagnosis not present

## 2018-01-30 DIAGNOSIS — L987 Excessive and redundant skin and subcutaneous tissue: Secondary | ICD-10-CM | POA: Diagnosis not present

## 2018-01-30 DIAGNOSIS — R209 Unspecified disturbances of skin sensation: Secondary | ICD-10-CM | POA: Diagnosis not present

## 2018-01-30 DIAGNOSIS — Z1331 Encounter for screening for depression: Secondary | ICD-10-CM | POA: Diagnosis not present

## 2018-01-30 DIAGNOSIS — Z13220 Encounter for screening for lipoid disorders: Secondary | ICD-10-CM | POA: Diagnosis not present

## 2018-01-30 DIAGNOSIS — Z79899 Other long term (current) drug therapy: Secondary | ICD-10-CM | POA: Diagnosis not present

## 2018-01-30 DIAGNOSIS — Z1389 Encounter for screening for other disorder: Secondary | ICD-10-CM | POA: Diagnosis not present

## 2018-01-30 DIAGNOSIS — E039 Hypothyroidism, unspecified: Secondary | ICD-10-CM | POA: Diagnosis not present

## 2018-01-30 DIAGNOSIS — Z0001 Encounter for general adult medical examination with abnormal findings: Secondary | ICD-10-CM | POA: Diagnosis not present

## 2018-01-30 DIAGNOSIS — R110 Nausea: Secondary | ICD-10-CM | POA: Diagnosis not present

## 2018-02-04 DIAGNOSIS — M542 Cervicalgia: Secondary | ICD-10-CM | POA: Diagnosis not present

## 2018-02-04 DIAGNOSIS — R2990 Unspecified symptoms and signs involving the nervous system: Secondary | ICD-10-CM | POA: Diagnosis not present

## 2018-02-04 DIAGNOSIS — M2560 Stiffness of unspecified joint, not elsewhere classified: Secondary | ICD-10-CM | POA: Diagnosis not present

## 2018-02-04 DIAGNOSIS — G5603 Carpal tunnel syndrome, bilateral upper limbs: Secondary | ICD-10-CM | POA: Diagnosis not present

## 2018-02-04 DIAGNOSIS — M6281 Muscle weakness (generalized): Secondary | ICD-10-CM | POA: Diagnosis not present

## 2018-02-04 DIAGNOSIS — M25511 Pain in right shoulder: Secondary | ICD-10-CM | POA: Diagnosis not present

## 2018-02-04 DIAGNOSIS — M5432 Sciatica, left side: Secondary | ICD-10-CM | POA: Diagnosis not present

## 2018-02-04 DIAGNOSIS — R29898 Other symptoms and signs involving the musculoskeletal system: Secondary | ICD-10-CM | POA: Diagnosis not present

## 2018-02-04 DIAGNOSIS — M791 Myalgia, unspecified site: Secondary | ICD-10-CM | POA: Diagnosis not present

## 2018-02-12 ENCOUNTER — Encounter (INDEPENDENT_AMBULATORY_CARE_PROVIDER_SITE_OTHER): Payer: Self-pay | Admitting: Orthopaedic Surgery

## 2018-02-12 DIAGNOSIS — M542 Cervicalgia: Secondary | ICD-10-CM | POA: Insufficient documentation

## 2018-02-20 ENCOUNTER — Ambulatory Visit: Payer: PPO | Admitting: Neurology

## 2018-02-24 DIAGNOSIS — E876 Hypokalemia: Secondary | ICD-10-CM | POA: Diagnosis not present

## 2018-02-26 ENCOUNTER — Ambulatory Visit (INDEPENDENT_AMBULATORY_CARE_PROVIDER_SITE_OTHER): Payer: PPO | Admitting: Neurology

## 2018-02-26 DIAGNOSIS — IMO0002 Reserved for concepts with insufficient information to code with codable children: Secondary | ICD-10-CM

## 2018-02-26 DIAGNOSIS — G43709 Chronic migraine without aura, not intractable, without status migrainosus: Secondary | ICD-10-CM | POA: Diagnosis not present

## 2018-02-26 MED ORDER — ONABOTULINUMTOXINA 100 UNITS IJ SOLR
155.0000 [IU] | Freq: Once | INTRAMUSCULAR | Status: AC
  Administered 2018-02-26: 155 [IU] via INTRAMUSCULAR

## 2018-02-26 NOTE — Progress Notes (Signed)
[facility]   Procedure Note Botox  Attending: Dr. [name]  Preoperative Diagnosis(es): Chronic migraine  Consent obtained from: The patient Benefits discussed included, but were not limited to decreased muscle tightness, increased joint range of motion, and decreased pain.  Risk discussed included, but were not limited pain and discomfort, bleeding, bruising, excessive weakness, venous thrombosis, muscle atrophy and dysphagia.  Anticipated outcomes of the procedure as well as he risks and benefits of the alternatives to the procedure, and the roles and tasks of the personnel to be involved, were discussed with the patient, and the patient consents to the procedure and agrees to proceed. A copy of the patient medication guide was given to the patient which explains the blackbox warning.  Patients identity and treatment sites confirmed Yes.  .  Details of Procedure: Skin was cleaned with alcohol. Prior to injection, the needle plunger was aspirated to make sure the needle was not within a blood vessel.  There was no blood retrieved on aspiration.    Following is a summary of the muscles injected  And the amount of Botulinum toxin used:  Dilution 200 units of Botox was reconstituted with 4 ml of preservative free normal saline. Time of reconstitution: At the time of the office visit (<30 minutes prior to injection)   Injections  155 total units of Botox was injected with a 30 gauge needle.  Injection Sites: L occipitalis: 15 units- 3 sites  R occiptalis: 15 units- 3 sites  L upper trapezius: 15 units- 3 sites R upper trapezius: 15 units- 3 sits          L paraspinal: 10 units- 2 sites R paraspinal: 10 units- 2 sites  Face L frontalis(2 injection sites):10 units   R frontalis(2 injection sites):10 units         L corrugator: 5 units   R corrugator: 5 units           Procerus: 5 units   L temporalis: 20 units R temporalis: 20 units   Agent:  200 units of botulinum Type A  (Onobotulinum Toxin type A) was reconstituted with 4 ml of preservative free normal saline.  Time of reconstitution: At the time of the office visit (<30 minutes prior to injection)     Total injected (Units): 155  Total wasted (Units): 15  Patient tolerated procedure well without complications.   Reinjection is anticipated in 3 months.  

## 2018-03-02 DIAGNOSIS — M791 Myalgia, unspecified site: Secondary | ICD-10-CM | POA: Diagnosis not present

## 2018-03-02 DIAGNOSIS — R413 Other amnesia: Secondary | ICD-10-CM | POA: Diagnosis not present

## 2018-03-02 DIAGNOSIS — M25511 Pain in right shoulder: Secondary | ICD-10-CM | POA: Diagnosis not present

## 2018-03-02 DIAGNOSIS — G5603 Carpal tunnel syndrome, bilateral upper limbs: Secondary | ICD-10-CM | POA: Diagnosis not present

## 2018-03-02 DIAGNOSIS — H04123 Dry eye syndrome of bilateral lacrimal glands: Secondary | ICD-10-CM | POA: Diagnosis not present

## 2018-03-02 DIAGNOSIS — M5432 Sciatica, left side: Secondary | ICD-10-CM | POA: Diagnosis not present

## 2018-03-02 DIAGNOSIS — R29898 Other symptoms and signs involving the musculoskeletal system: Secondary | ICD-10-CM | POA: Diagnosis not present

## 2018-03-02 DIAGNOSIS — M3501 Sicca syndrome with keratoconjunctivitis: Secondary | ICD-10-CM | POA: Diagnosis not present

## 2018-03-02 DIAGNOSIS — M542 Cervicalgia: Secondary | ICD-10-CM | POA: Diagnosis not present

## 2018-03-02 DIAGNOSIS — R2990 Unspecified symptoms and signs involving the nervous system: Secondary | ICD-10-CM | POA: Diagnosis not present

## 2018-03-02 DIAGNOSIS — H1013 Acute atopic conjunctivitis, bilateral: Secondary | ICD-10-CM | POA: Diagnosis not present

## 2018-03-02 DIAGNOSIS — H538 Other visual disturbances: Secondary | ICD-10-CM | POA: Diagnosis not present

## 2018-03-31 DIAGNOSIS — M797 Fibromyalgia: Secondary | ICD-10-CM | POA: Diagnosis not present

## 2018-04-01 DIAGNOSIS — M5432 Sciatica, left side: Secondary | ICD-10-CM | POA: Diagnosis not present

## 2018-04-01 DIAGNOSIS — M542 Cervicalgia: Secondary | ICD-10-CM | POA: Diagnosis not present

## 2018-04-01 DIAGNOSIS — G5600 Carpal tunnel syndrome, unspecified upper limb: Secondary | ICD-10-CM | POA: Diagnosis not present

## 2018-04-01 DIAGNOSIS — M25511 Pain in right shoulder: Secondary | ICD-10-CM | POA: Diagnosis not present

## 2018-04-01 DIAGNOSIS — R29898 Other symptoms and signs involving the musculoskeletal system: Secondary | ICD-10-CM | POA: Diagnosis not present

## 2018-04-01 DIAGNOSIS — M791 Myalgia, unspecified site: Secondary | ICD-10-CM | POA: Diagnosis not present

## 2018-04-01 DIAGNOSIS — R2990 Unspecified symptoms and signs involving the nervous system: Secondary | ICD-10-CM | POA: Diagnosis not present

## 2018-04-01 DIAGNOSIS — R413 Other amnesia: Secondary | ICD-10-CM | POA: Diagnosis not present

## 2018-04-07 ENCOUNTER — Ambulatory Visit (INDEPENDENT_AMBULATORY_CARE_PROVIDER_SITE_OTHER): Payer: PPO | Admitting: Neurology

## 2018-04-07 ENCOUNTER — Encounter: Payer: Self-pay | Admitting: Neurology

## 2018-04-07 ENCOUNTER — Other Ambulatory Visit: Payer: PPO

## 2018-04-07 VITALS — BP 116/72 | HR 78 | Ht 69.0 in | Wt 194.0 lb

## 2018-04-07 DIAGNOSIS — R413 Other amnesia: Secondary | ICD-10-CM

## 2018-04-07 DIAGNOSIS — Z1321 Encounter for screening for nutritional disorder: Secondary | ICD-10-CM | POA: Diagnosis not present

## 2018-04-07 NOTE — Progress Notes (Signed)
NEUROLOGY FOLLOW UP OFFICE NOTE  AVE SCHARNHORST [id number]  HISTORY OF PRESENT ILLNESS: Paige [name] is a 52 year old right-handed woman with depression, hypothyroidism whom I see for migraines presents today to discuss memory deficits.  She has noted short term memory problems for about 2 years.  She frequently misplaces items around the house.  She does get distracted and will not get around to chores around the house.  She has been late paying bills on three occasions.  One time, she got disoriented driving on a familiar route.  She sometimes has trouble recalling names of familiar people.  She also has word-finding difficulty and problems with spelling.  She saw a speech pathologist who told her she has memory deficits.  Her thyroid panel was recently checked and was within normal range. Her uncle and aunt on her mother's side had dementia.  PAST MEDICAL HISTORY: Past Medical History:  Diagnosis Date  . Anemia   . Anxiety disorder   . Chronic headaches   . Depression   . Family history of colon cancer    mother,uncles,aunts  . Fibromyalgia   . Gallstones   . GERD (gastroesophageal reflux disease)   . Hypothyroidism   . IBS (irritable bowel syndrome)   . IBS (irritable bowel syndrome)   . Obesity   . Obesity   . Personal history of colonic polyps 04/2006   polypoid  . PONV (postoperative nausea and vomiting)     MEDICATIONS: Current Outpatient Medications on File Prior to Visit  Medication Sig Dispense Refill  . Ascorbic Acid (VITAMIN C) 1000 MG tablet Take 240 mg by mouth.    Marland Kitchen buPROPion (WELLBUTRIN SR) 150 MG 12 hr tablet 2 every am and 1 at bedtime    . cetirizine (ZYRTEC) 10 MG tablet Take 1 tablet (10 mg total) by mouth daily. 1 tablet 0  . Coenzyme Q10 (COQ-10) 100 MG CAPS Take 100 mg by mouth daily.    . DULoxetine (CYMBALTA) 60 MG capsule Take 60 mg by mouth 2 (two) times daily.     . furosemide (LASIX) 40 MG tablet Take 40 mg by mouth 2 (two) times  daily.    Marland Kitchen gabapentin (NEURONTIN) 100 MG capsule Take 100 mg by mouth 3 (three) times daily.    Marland Kitchen HYDROcodone-acetaminophen (NORCO) 5-325 MG tablet Take 1 tablet by mouth every 6 (six) hours as needed for moderate pain. (Patient not taking: Reported on 12/16/2017) 30 tablet 0  . ipratropium (ATROVENT) 0.03 % nasal spray Place 2 sprays into both nostrils every 12 (twelve) hours. 30 mL 12  . LATUDA 20 MG TABS tablet Take 1 tablet by mouth daily.  0  . levothyroxine (SYNTHROID, LEVOTHROID) 88 MCG tablet Take 88 mcg by mouth daily before breakfast.    . naproxen (NAPROSYN) 500 MG tablet TAKE 1 TABLET BY MOUTH EVERY 12 HOURS AS NEEDED 16 tablet 3  . nystatin (MYCOSTATIN/NYSTOP) powder     . olopatadine (PATANOL) 0.1 % ophthalmic solution Place 1 drop into both eyes daily.    Marland Kitchen omeprazole (PRILOSEC) 20 MG capsule Take 20 mg by mouth daily.    . ondansetron (ZOFRAN-ODT) 4 MG disintegrating tablet Take 1 tablet (4 mg total) by mouth 3 (three) times daily before meals. 90 tablet 1  . potassium chloride (K-DUR,KLOR-CON) 10 MEQ tablet Take 1 tablet by mouth daily.    . ranitidine (ZANTAC) 300 MG capsule Take 300 mg by mouth every evening.    Marland Kitchen rOPINIRole (REQUIP) 2 MG tablet  Take 2 mg by mouth 2 (two) times daily.    . traZODone (DESYREL) 100 MG tablet Take 200 mg by mouth at bedtime.     No current facility-administered medications on file prior to visit.     ALLERGIES: Allergies  Allergen Reactions  . Oxycodone-Acetaminophen Hives  . Topamax [Topiramate] Shortness Of Breath and Other (See Comments)    Really wierd feeling Off balance; cloudy thinking Really wierd feeling  . Sumatriptan Other (See Comments)    Difficulty breathing  . Flexeril [Cyclobenzaprine Hcl]   . Imitrex [Sumatriptan Base]   . Mobic [Meloxicam]   . Morphine And Related   . Percocet [Oxycodone-Acetaminophen]   . Pork (Porcine) Protein     Other reaction(s): Other (See Comments) Migraines    FAMILY HISTORY: Family  History  Problem Relation Age of Onset  . Colon cancer Mother        uncles,aunts  . Diabetes Father        paternal grandmother,sister  . Leukemia Father   . Lymphoma Father   . Skin cancer Father   . Heart disease Father   . Colon polyps Sister        1st degree cousins  . Diabetes Sister   . Stomach cancer Neg Hx     SOCIAL HISTORY: Social History   Socioeconomic History  . Marital status: Single    Spouse name: Not on file  . Number of children: 0  . Years of education: Not on file  . Highest education level: Not on file  Occupational History  . Occupation: RN/disabled  Social Needs  . Financial resource strain: Not on file  . Food insecurity:    Worry: Not on file    Inability: Not on file  . Transportation needs:    Medical: Not on file    Non-medical: Not on file  Tobacco Use  . Smoking status: Never Smoker  . Smokeless tobacco: Never Used  Substance and Sexual Activity  . Alcohol use: Yes    Alcohol/week: 0.0 oz    Comment: rarely  . Drug use: No  . Sexual activity: Not on file  Lifestyle  . Physical activity:    Days per week: Not on file    Minutes per session: Not on file  . Stress: Not on file  Relationships  . Social connections:    Talks on phone: Not on file    Gets together: Not on file    Attends religious service: Not on file    Active member of club or organization: Not on file    Attends meetings of clubs or organizations: Not on file    Relationship status: Not on file  . Intimate partner violence:    Fear of current or ex partner: Not on file    Emotionally abused: Not on file    Physically abused: Not on file    Forced sexual activity: Not on file  Other Topics Concern  . Not on file  Social History Narrative  . Not on file    REVIEW OF SYSTEMS: Constitutional: No fevers, chills, or sweats, no generalized fatigue, change in appetite Eyes: No visual changes, double vision, eye pain Ear, nose and throat: No hearing loss, ear  pain, nasal congestion, sore throat Cardiovascular: No chest pain, palpitations Respiratory:  No shortness of breath at rest or with exertion, wheezes GastrointestinaI: No nausea, vomiting, diarrhea, abdominal pain, fecal incontinence Genitourinary:  No dysuria, urinary retention or frequency Musculoskeletal:  No neck pain, back  pain Integumentary: No rash, pruritus, skin lesions Neurological: as above Psychiatric: depression Endocrine: No palpitations, fatigue, diaphoresis, mood swings, change in appetite, change in weight, increased thirst Hematologic/Lymphatic:  No purpura, petechiae. Allergic/Immunologic: no itchy/runny eyes, nasal congestion, recent allergic reactions, rashes  PHYSICAL EXAM: Vitals:   04/07/18 0912  BP: 116/72  Pulse: 78  SpO2: 98%   General: No acute distress.  Patient appears well-groomed.   Head:  Normocephalic/atraumatic Eyes:  Fundi examined but not visualized Neck: supple, no paraspinal tenderness, full range of motion Heart:  Regular rate and rhythm Lungs:  Clear to auscultation bilaterally Back: No paraspinal tenderness Neurological Exam: alert and oriented to person, place, and time. Attention span and concentration intact, recent and remote memory intact, fund of knowledge intact.  Speech fluent and not dysarthric, language intact.  Visualspatial/executive functioning (Trail Making Test, copying cube, drawing clock) intact. MMSE - Mini Mental State Exam 04/07/2018  Orientation to time 5  Orientation to Place 5  Registration 3  Attention/ Calculation 4  Recall 3  Language- name 2 objects 2  Language- repeat 1  Language- follow 3 step command 3  Language- read & follow direction 1  Write a sentence 1  Copy design 1  Total score 29   IMPRESSION: Memory deficits  PLAN: 1.  MRI of brain without contrast 2.  B12 level 3.  Neurocognitive testing. 4.  Follow up after testing.  25 minutes spent face to face with patient, over 50% spent  discussing management.  Metta Clines, DO  CC:  Dr. Laqueta Due

## 2018-04-07 NOTE — Patient Instructions (Addendum)
1.  We will check MRI of brain without contrast. We have sent a referral to [place] for your MRI and they will call you directly to schedule your appt. They are located at [place]. If you need to contact them directly please call: [phone].  2.  We will check a B12 level. Your provider has requested that you have labwork completed today. Please go to [facility] ([address]) on the second floor of this building before leaving the office today. You do not need to check in. If you are not called within 15 minutes please check with the front desk.   3.  I will refer you to Dr. Si Raider here in our office for neurocognitive testing.  4.  Follow up after testing.  5.  I will see you again for next Botox.

## 2018-04-08 ENCOUNTER — Telehealth: Payer: Self-pay

## 2018-04-08 LAB — TIQ-NTM

## 2018-04-08 LAB — VITAMIN B12: VITAMIN B 12: 560 pg/mL (ref 200–1100)

## 2018-04-08 NOTE — Telephone Encounter (Signed)
-----   Message from Pieter Partridge, DO sent at 04/08/2018 10:40 AM EDT ----- b12 is normal

## 2018-04-08 NOTE — Telephone Encounter (Signed)
Called and spoke with Pt, advsd her of lab results

## 2018-04-17 ENCOUNTER — Ambulatory Visit
Admission: RE | Admit: 2018-04-17 | Discharge: 2018-04-17 | Disposition: A | Discharge status: Home / Self Care | Payer: PPO | Source: Ambulatory Visit | Attending: Neurology | Admitting: Neurology

## 2018-04-17 DIAGNOSIS — R410 Disorientation, unspecified: Secondary | ICD-10-CM | POA: Diagnosis not present

## 2018-04-17 DIAGNOSIS — R413 Other amnesia: Secondary | ICD-10-CM

## 2018-04-22 ENCOUNTER — Telehealth: Payer: Self-pay

## 2018-04-22 NOTE — Telephone Encounter (Signed)
-----   Message from Pieter Partridge, DO sent at 04/20/2018  9:19 AM EDT ----- MRI of brain does not show anything concerning.  There is a small spot on the brain which appears to be artifact or a benign vascular anomaly.  Another possibility is an area of old blood if she has any known history.  I don't have imaging of her prior MRI performed at [facility] in 2015, but it says it looks stable compared to that MRI.

## 2018-04-22 NOTE — Telephone Encounter (Signed)
Called and spoke with Pt, advised her of MRI results. 

## 2018-04-29 ENCOUNTER — Ambulatory Visit (INDEPENDENT_AMBULATORY_CARE_PROVIDER_SITE_OTHER): Payer: PPO | Admitting: Orthopaedic Surgery

## 2018-04-30 ENCOUNTER — Other Ambulatory Visit: Payer: Self-pay | Admitting: *Deleted

## 2018-04-30 NOTE — Patient Outreach (Signed)
[facility]) Care Management  04/30/2018  LURLINE CAVER 09/21/1966 [id number]  Referral via [place]/Quality [employer] Post Review;  Patient needing assistance with medication management; transportation to MD office visits.  Telephone call to patient who was advised of reason for call & [facility] care management services. HIPPA verification received from patient.   Patient voices that health conditions include-Fibromyalgia, memory problems x 54yrs(short & long term deficits), migraines, back disc problems and  hx major depression. States she sees neurologist, psychiatrist, orthopedist   others. .      States she lives alone and cares for self and runs her household. Has   some support from her mother. Patient voices that she is able to drive herself to local primary care provider but most of her other doctors are in [place] and she is not comfortable driving due to memory concerns and not comfortable driving out of her town.    States getting prescriptions filled at local pharmacy and she picks them up. States she has large pill box and fixes it herself. States sometimes she gets them confused & misses taking them. States some need to be taken more than once. States she does forget sometimes.   States she does not cook unless someone is with her due to memory problems.     Recommendations for referral to [facility] care coordinator for further in home evaluation Judd Gaudier of care needs and management of chronic conditions; referral to Clinical Social Worker for possible assistance with community resources for transportation to out of town medical appointments, and resources for in home support; referral to Pharmacist for medication management (patient has memory deficit and is having trouble managing meds. .   Patient consents to [facility] care management services.  Plan: Refer to [facility] care coordinator, Clinical Social Worker and  Pharmacist.  Sherrin Daisy, RN BSN [place] Management Coordinator [facility] Care Management  [phone]

## 2018-05-01 ENCOUNTER — Telehealth: Payer: Self-pay | Admitting: Pharmacist

## 2018-05-01 NOTE — Patient Outreach (Signed)
[facility]) Care Management  05/01/2018  Paige [name] Apr 13, 1966 [id number]   52 year old female referred to [place] Management by [facility] Utilization Management for medication management and transportation to provider office visits.  PMHx includes, but not limited to, fibromyalgia, memory issues, depression, anxiety, GERD, hypothyroidism, migraines, IBS, and obesity.  Noted patient had neurology visit 04/07/2018 regarding memory loss with Dr. Tomi Likens and will have f/u visit on 05/28/2018.    Subjective:  Successful call to Ms. Paige [name].  HIPAA identifiers verified. Patient reports that she is having trouble remembering to take her medications.  She has a daughter that lives nearby but does not currently assist with helping with medications.  She is filling a pillbox weekly but states she is having difficulty with managing it.  She also states that she has trouble affording Latuda and Restasis.   Objective:  No recent labs in [facility]  Medications Reviewed Today    Reviewed by Pieter Partridge, DO (Physician) on 04/07/18 at [phone]  Med List Status: <None>  Medication Order Taking? Sig Documenting Provider Last Dose Status Informant  Ascorbic Acid (VITAMIN C) 1000 MG tablet [id number] No Take 240 mg by mouth. [provider] Not Taking Active            Med Note (DAVIS, SOPHIA A   Mon May 12, 2017 11:18 AM)    buPROPion (WELLBUTRIN SR) 150 MG 12 hr tablet [id number]  2 every am and 1 at bedtime [provider]  Active   cetirizine (ZYRTEC) 10 MG tablet [id number]  Take 1 tablet (10 mg total) by mouth daily. Tanda Rockers, MD  Active   Coenzyme Q10 (COQ-10) 100 MG CAPS [id number]  Take 100 mg by mouth daily. [provider]  Active   DULoxetine (CYMBALTA) 60 MG capsule [id number]  Take 60 mg by mouth 2 (two) times daily.  [provider]  Active   furosemide (LASIX) 40 MG tablet [id number]  Take 40 mg by mouth 2 (two) times daily. [provider]  Active   gabapentin (NEURONTIN) 100 MG capsule [id number]  Take 100 mg by mouth 3 (three) times daily. [provider]  Active   HYDROcodone-acetaminophen (NORCO) 5-325 MG tablet [id number]  Take 1 tablet by mouth every 6 (six) hours as needed for moderate pain.  Patient not taking:  Reported on 12/16/2017   Daryll Brod, MD  Active   ipratropium (ATROVENT) 0.03 % nasal spray [id number]  Place 2 sprays into both nostrils every 12 (twelve) hours. Tanda Rockers, MD  Active   LATUDA 20 MG TABS tablet [id number]  Take 1 tablet by mouth daily. [provider]  Active   levothyroxine (SYNTHROID, LEVOTHROID) 88 MCG tablet [id number]  Take 88 mcg by mouth daily before breakfast. [provider]  Active   naproxen (NAPROSYN) 500 MG tablet [id number]  TAKE 1 TABLET BY MOUTH EVERY 12 HOURS AS NEEDED Pieter Partridge, DO  Active   nystatin (MYCOSTATIN/NYSTOP) powder [id number]   [provider]  Active   olopatadine (PATANOL) 0.1 % ophthalmic solution [id number]  Place 1 drop into both eyes daily. [provider]  Active   omeprazole (PRILOSEC) 20 MG capsule [id number]  Take 20 mg by mouth daily. [provider]  Active   ondansetron (ZOFRAN-ODT) 4 MG disintegrating tablet [id number]  Take 1 tablet (4 mg total) by mouth 3 (three) times daily before meals. Ladene Artist, MD  Active   potassium chloride (K-DUR,KLOR-CON) 10  MEQ tablet [id number]  Take 1 tablet by mouth daily. [provider]  Active   ranitidine (ZANTAC) 300 MG capsule [id number]  Take 300 mg by mouth every evening. [provider]  Active   rOPINIRole (REQUIP) 2 MG tablet [id number]  Take 2 mg by mouth 2 (two) times daily. [provider]  Active   traZODone (DESYREL) 100 MG tablet [id number]  Take 200 mg by mouth at bedtime. [provider]  Active          Assessment:  Drugs sorted by system:  Neurologic/Psychologic: bupropion, duloxetine,  gabapentin, latuda  Cardiovascular: furosemide  Pulmonary/Allergy: cetirizine, ipratropium  Gastrointestinal: omeprazole, ondansetron ODT,   Endocrine: levothyroxine  Renal:  Topical: cyclosporine eye drops, ketoconazole shampoo, nystatin powder, nystatin cream, olopatadine eye drops  Pain: naproxen  Vitamins/Minerals: calcium-magnesium-vitamin D, coenzyme Q10,   Infectious Diseases:  Miscellaneous:   Duplications in therapy:  Gaps in therapy:  Medications to avoid in the elderly:  Drug interactions:  Mobic listed as allergy, but no reaction documented.  Patient taking meloxicam (NSAID) without issues.   Other issues noted:   Medication Management:  Patient unable to accurately take medications due to memory loss.  We reviewed options including bubble packs and pillboxes and patient is interested in the bubble packs / compliance packs.  Her current pharmacy, [facility], offers this service.  3-way call placed to [facility] pharmacy.  This service is $10/month which patient states she can afford.  Pharmacist in charge of service is not working until today but will call patient next Tuesday to discuss in more detail.   Patient requests that OTCs are included in packs if possible.    Medication Assistance: Extra Help: Patient reports that she has applied for this in the past but her income is over the required amount.    Latuda PAP: Patient not eligible because she has medication coverage.  Latuda coupon via [place] is still much more expensive than coverage gap price.    Restasis PAP: [employer]er[employer]: Patient may meet eligibility requirements and is agreeable to apply.    Plan:  I will follow-up with patient later this week regarding medication adherence and compliance packaging through [employer].   I will route patient assistance letter to pharmacy technician, Etter Sjogren, who will coordinate application process for Restasis.  She will assist with obtaining all pertinent  documents from both patient and provider and submit application once completed.    Ralene Bathe, PharmD, [place] [phone]

## 2018-05-04 ENCOUNTER — Other Ambulatory Visit: Payer: Self-pay | Admitting: Pharmacy Technician

## 2018-05-04 ENCOUNTER — Other Ambulatory Visit: Payer: Self-pay

## 2018-05-04 NOTE — Patient Outreach (Signed)
Telephone assessment: New referral for memory loss:  Placed call to patient who reports decrease memory. Reviewed reasons for call. Reviewed [facility] case management program. Offered home visit for assessment of needs and patient agreed.  PLAN:  Offered home visit for 04/12/2018 at 1:30 pm.  Patient agreed. Confirmed address. Provided my contact information.  Tomasa Rand, RN, BSN, CEN [facility] [employer] [phone]

## 2018-05-04 NOTE — Patient Outreach (Signed)
[facility]) Care Management  05/04/2018  Paige [name] 12/30/1965 [id number]  Received [employer] patient assistance referral from [place] for Restasis. Prepared patient portion to be mailed out and faxed provider portion into Dr. Laqueta Due on 06/03.  Will follow up with patient in 7-10 business days to verify receipt of application.  Maud Deed Mead, [place] Management [phone]

## 2018-05-06 ENCOUNTER — Ambulatory Visit: Payer: Self-pay

## 2018-05-06 ENCOUNTER — Other Ambulatory Visit: Payer: Self-pay | Admitting: Pharmacist

## 2018-05-06 ENCOUNTER — Ambulatory Visit: Payer: Self-pay | Admitting: Pharmacist

## 2018-05-06 NOTE — Patient Outreach (Signed)
[facility]) Care Management  05/06/2018  LACHELLE RISSLER 12/16/1965 [id number]   Unsuccessful call to Ms. Terance Hart today.  I left a HIPAA compliant voicemail on her home phone.    Plan: I will follow-up with patient in 3-4 business days regarding medication management.   Ralene Bathe, PharmD, [place] [phone]

## 2018-05-07 ENCOUNTER — Other Ambulatory Visit: Payer: Self-pay

## 2018-05-07 NOTE — Patient Outreach (Signed)
[facility]) Care Management  05/07/2018  Paige [name] 1966-02-15 [id number]  Initial outreach to patient to follow-up on referral for transportation and in-home support. HIPAA identifiers verified. Patient lives alone and suffers memory loss. Patient states she "has trouble getting self to do things I'm supposed to". When BSW asked patient to elaborate, patient indicated she forgets easily. Patient stated "I can get ready to do something but when I look away it's gone."   Patient has several upcomming appointments to her neurologist in [place] as well as to a new specialist in [place]. Patients mom able to help some but patient indicates she is not always available. BSW discussed [facility] program with patient as they make trips once weekly to surrounding cities. BSW unsure if patient will qualify for this program due to age. BSW to call [facility] to inquire if there are age requirements.  Patient would like in home assistance to provide assistance with household chores as well as meal preparation. Patient states she does not remember to perform household chores and that she continually burns food when attempting to prepare independently. Although patient is independent with ADL's she reported an episode of loosing balance in shower recently. Patient denies falling but would be interested in a shower chair. BSW to update RN CM assigned to patients care team of possible equipment needed.   Patient unable to afford large out of pocket expense to afford an in-home support. Patients source of income is disability and makes over the income eligibility limit for Medicaid services. Patient does not qualify for referral to [facility] due to age. BSW to outreach local resources within patients county as well as co-workers for ideas in [place] patient with needed resources.  BSW provided patient with direct contact information. BSW to plan a follow-up call with patient next week to discuss  eligibility to community resources.  Daneen Schick, Arita Miss, CDP Social Worker [phone]

## 2018-05-12 ENCOUNTER — Other Ambulatory Visit: Payer: Self-pay | Admitting: Pharmacist

## 2018-05-12 ENCOUNTER — Other Ambulatory Visit: Payer: Self-pay

## 2018-05-12 NOTE — Patient Outreach (Signed)
[facility]) Care Management  05/12/2018  Paige [name] [birth year] [id number]  Successful outreach to patient to discuss [facility] services. HIPAA identifiers verified. BSW spoke with [facility] transportation coordinator, Elby [name] prior to contacting patient. BSW updated patient that [facility] does not have an age requirement meaning patient can utilize this program for transportation needs. [facility] will make trips to surrounding cities such as [place] on specific days of the week. Patient asked BSW to give demographic information to [employer] and request a riders guide be mailed to her home address.  BSW also updated patient that this BSW is awaiting return calls about possible resources for in home support. BSW to contact patient in the next two weeks to discuss in home support resources.  Daneen Schick, Arita Miss, CDP Social Worker [phone]

## 2018-05-12 NOTE — Patient Outreach (Signed)
[facility]) Care Management  05/12/2018  Paige [name] 01-02-1966 [id number]   Unsuccessful call to Ms. Paige [name] regarding medications.  I left a HIPAA compliant voicemail on her home phone.  I will follow-up with Paige [name] later this week unless I have heard back from her beforehand.   Addendum: Incoming call from Ms. Paige [name]. HIPAA identifiers verified. Ms. Paige [name] reports that she has started using the compliance packs from [employer].  She reports that is is very easy to use and she is happy to continue this service.  Vitamins were excluded this month but the pharmacy can include them next month for Paige [name].  She has received the PAP application for Restasis but is looking for her denial letter from [facility] to return to [facility].  No other medication questions at this time.    Plan: [place] technician will continue to contact Paige [name] regarding Restasis application.  I will follow-up with Paige [name] as needed.   Ralene Bathe, PharmD, [place] [phone]

## 2018-05-13 ENCOUNTER — Other Ambulatory Visit: Payer: Self-pay

## 2018-05-13 NOTE — Patient Outreach (Signed)
[facility]) Care Management   05/13/2018  Paige [name] 1966-04-29 [id number]  Paige [name] is an 52 y.o. female Arrived for home visit. Patient answered door. 2 cats in the home.  Subjective: Reports numbness in toes and fingers in the last couple of weeks.   Objective:  Home is cluttered and smells of cats. Patient is awake and alert but appears sluggish.   Today's Vitals   05/13/18 1400  BP: 122/80  Pulse: 82  Resp: 16  SpO2: 98%  Weight: 190 lb (86.2 kg)  Height: 1.753 m (5\' 9" )  PainSc: 9    Review of Systems  Constitutional: Positive for malaise/fatigue.  HENT: Negative.   Eyes: Negative.   Respiratory: Positive for cough.        Reports occasional cough  Cardiovascular: Positive for chest pain and leg swelling.       Reports an episode of chest pain yesterday that lasted for 10 minutes. During activity in the kitchen. No other symptoms.   Gastrointestinal: Positive for constipation, diarrhea, nausea and vomiting.  Genitourinary: Negative.   Musculoskeletal: Positive for back pain and joint pain.       Complains of lower back pain off and on for years. Reports pain is worse today.   Skin:       Reports burning sensation sensation between legs.  Reports she wear cotton.   Denies any skin breakdown.   Neurological: Positive for headaches.       Reports history of migraines about 2-3 times per month.  Reports worsening memory loss after ECT treatments in the 1990's  Endo/Heme/Allergies: Negative.   Psychiatric/Behavioral: Positive for depression.       Reports she forgets food is cooking, can not remember and keep a train of thought.     Physical Exam  Constitutional: She is oriented to person, place, and time. She appears well-developed and well-nourished.  Cardiovascular: Normal rate, regular rhythm, normal heart sounds and intact distal pulses.  Respiratory: Effort normal and breath sounds normal.  GI: Soft. Bowel sounds are normal.   Musculoskeletal: Normal range of motion. She exhibits edema.  Right leg swelling  Neurological: She is alert and oriented to person, place, and time.  2019,  [place], Wednesday, June,  July 4th.  Grips equal and strong. See a neurologist name Gaffe      [facility])  Skin: Skin is warm and dry.  Psychiatric: She has a normal mood and affect. Her behavior is normal. Judgment and thought content normal.  Appears sluggish. Pupils large.     Encounter Medications:   Outpatient Encounter Medications as of 05/13/2018  Medication Sig Note  . buPROPion (WELLBUTRIN SR) 150 MG 12 hr tablet Take 450 mg by mouth daily.   Marland Kitchen CALCIUM-MAGNESIUM-VITAMIN D PO Take 2 tablets by mouth 2 (two) times daily.   . cetirizine (ZYRTEC) 10 MG tablet Take 1 tablet (10 mg total) by mouth daily.   . Coenzyme Q10 (COQ-10 PO) Take 240 mg by mouth daily.    . DULoxetine (CYMBALTA) 30 MG capsule Take 90 mg by mouth daily.   . furosemide (LASIX) 40 MG tablet Take 40 mg by mouth 2 (two) times daily.   Marland Kitchen gabapentin (NEURONTIN) 100 MG capsule Take 100 mg by mouth 3 (three) times daily.   Marland Kitchen ipratropium (ATROVENT) 0.03 % nasal spray Place 2 sprays into both nostrils as needed.    Marland Kitchen ketoconazole (NIZORAL) 2 % shampoo Apply 1 application topically daily.   Marland Kitchen LATUDA 20 MG  TABS tablet Take 1 tablet by mouth daily with supper.  05/13/2018: Take every other day  . levothyroxine (SYNTHROID, LEVOTHROID) 88 MCG tablet Take 88 mcg by mouth daily before breakfast.   . naproxen (NAPROSYN) 500 MG tablet TAKE 1 TABLET BY MOUTH EVERY 12 HOURS AS NEEDED   . nystatin (MYCOSTATIN/NYSTOP) powder Apply topically 2 (two) times daily.    Marland Kitchen nystatin cream (MYCOSTATIN) Apply 1 application topically daily as needed for dry skin.   Marland Kitchen olopatadine (PATANOL) 0.1 % ophthalmic solution Place 1 drop into both eyes daily as needed for allergies.    Marland Kitchen omeprazole (PRILOSEC) 20 MG capsule Take 20 mg by mouth daily.   . ondansetron (ZOFRAN-ODT) 4 MG disintegrating  tablet Take 1 tablet (4 mg total) by mouth 3 (three) times daily before meals.   Marland Kitchen OVER THE COUNTER MEDICATION Take 1 tablet by mouth daily with supper. [name]   . potassium chloride (K-DUR,KLOR-CON) 10 MEQ tablet Take 1 tablet by mouth daily.   Marland Kitchen rOPINIRole (REQUIP) 2 MG tablet Take 2 mg by mouth 2 (two) times daily.   . traZODone (DESYREL) 100 MG tablet Take 200 mg by mouth at bedtime.   . cycloSPORINE (RESTASIS) 0.05 % ophthalmic emulsion Place 1 drop into both eyes 2 (two) times daily.   Marland Kitchen OVER THE COUNTER MEDICATION Take 1 tablet by mouth. Rejuvinexx    No facility-administered encounter medications on file as of 05/13/2018.     Functional Status:   In your present state of health, do you have any difficulty performing the following activities: 05/13/2018 09/25/2017  Hearing? N N  Vision? Y N  Difficulty concentrating or making decisions? Y N  Walking or climbing stairs? Y N  Dressing or bathing? N N  Doing errands, shopping? Y -  Conservation officer, nature and eating ? Y -  Comment reports she forgets she is cooking and burns her food.  -  Using the Toilet? N -  In the past six months, have you accidently leaked urine? Y -  Do you have problems with loss of bowel control? N -  Managing your Medications? Y -  Comment is now using a pill packing from [employer].  -  Managing your Finances? Y -  Comment forgotten to pay bills.  Has problems with compulsive spending.  -  Housekeeping or managing your Housekeeping? Y -  Some recent data might be hidden    Fall/Depression Screening:    Fall Risk  05/13/2018 04/30/2018 04/07/2018  Falls in the past year? Yes Yes Yes  Number falls in past yr: 1 2 or more -  Injury with Fall? No Yes -  Risk for fall due to : - Impaired balance/gait -  Follow up Falls evaluation completed;Falls prevention discussed - -   PHQ 2/9 Scores 05/13/2018 04/30/2018  PHQ - 2 Score 5 2  PHQ- 9 Score 18 13    Assessment:   (1) reviewed Paige [name] care management plan.  Reviewed source of referral. Provided a new patient welcome packet. Provided [name] calendar, my contact card. Provided care a nurse magnet. Reviewed consent and consent signed.  (2) positive depression screening (3) chronic pain. (4) needs shower bench or chair. Home assessment completed.  (5) medications; has recently started using a pill packing system from [facility]. Patient states this is working well. (6) alteration in memory.   (7) pending MD appointments. All reviewed with patient according to Epic.  Plan:  (1) consent scanned into chart. Encouraged patient to  call for problems questions. Otherwise follow up planned for 30 days.  (2)  In basket message sent to [facility] social worker. Encouraged patient to consider counseling. Encouraged patient to discuss depression with caregiver at the [facility] center.  (3) reviewed with patient the importance of good sleep, nutrition and exercise. Encouraged patient to use her silver sneaker benefits for an exercise program. Reviewed importance of getting on a daily schedule.  (4)  Reviewed suggestion for bathroom and encouraged patient to consider a shower bench outside of tub. (5) Encouraged patient to set alarms on phone to assist with reminders when to take medications, (6) reviewed pending appointments with neurology. Reviewed puzzles and other ideas for memory stimulation. Encouraged patient to get organized and write down important information to help her remember. (7) confirmed transportation.   Care planning and goal setting and primary goal is to improve memory. This note and barrier letter sent to MD.   [facility] CM Care Plan Problem One     Most Recent Value  Care Plan Problem One  Alteration in memory as evidence by patient forgetting to take her medications without a reminder.   Role Documenting the Problem One  Care Management [place] for Problem One  Active  [facility] Long Term Goal   Patient will report improved memory in the  next 31 days.   THN Long Term Goal Start Date  05/13/18  Interventions for Problem One Long Term Goal  Reviewed ways for patient to improve memory llike puzzles, games. Encouraged patient to write down important information in a notebook. Reviewed with pateint to discuss depression and concerns with [facility].  [facility] CM Short Term Goal #1   Patient will report getting into a regular schedule for her days to assist with feeling organzied and accomplished in the next 30 days.  THN CM Short Term Goal #1 Start Date  05/13/18  Interventions for Short Term Goal #1  Reviewed importance of following a routine.   THN CM Short Term Goal #2   Patient will report discussing concerns about depression at next visit at [facility] in the next 30 days.   THN CM Short Term Goal #2 Start Date  05/13/18  Interventions for Short Term Goal #2  Encouraged patient to be open with her caregiver and discuss her concerns. Update provided to [facility] social worker.      Tomasa Rand, RN, BSN, CEN [name] [employer] [phone]

## 2018-05-14 ENCOUNTER — Other Ambulatory Visit: Payer: Self-pay

## 2018-05-14 ENCOUNTER — Ambulatory Visit: Payer: Self-pay | Admitting: Pharmacist

## 2018-05-14 NOTE — Patient Outreach (Signed)
[facility]) Care Management  05/14/2018  Paige [name] [birth year] [id number]  BSW received return call from Harold Hedge, Case Worker with [place] adult services division. BSW discussed patient needs and inquired if any services were available. Shanon Brow stated he was unsure. Provided this BSW with the adult services supervisor, Morey Hummingbird, contact number. BSW contacted Morey Hummingbird and left a voice message requesting a return call.  Daneen Schick, Arita Miss, CDP Social Worker [phone]

## 2018-05-18 ENCOUNTER — Other Ambulatory Visit: Payer: Self-pay

## 2018-05-18 ENCOUNTER — Other Ambulatory Visit: Payer: Self-pay | Admitting: *Deleted

## 2018-05-18 NOTE — Patient Outreach (Signed)
[facility]) Care Management  05/18/2018  Paige [name] August 21, 1966 [id number]  Successful call to the patient to continue discussing transportation. HIPAA identifiers verified. Patient confirmed she received the [place] in the mail today. BSW explained to the patient that she would need to contact [facility] directly to enroll in the program. BSW explained this would be done telephonically and involved patient providing demographic information. Patient understands she must do this prior to utilizing the [facility] transportation service. BSW inquired if patient has transportation arrangements for her upcomming June appointments. Patient confirmed that she has made arrangements for her mom and friend to assist in providing transportation.  BSW spoke with patient about her scheduled home visit with CSW Eduard Clos. Patient confirmed speaking with CSW on today's date. BSW explained to the patient that BSW would remove self from patients care team considering the patient has been provided [facility] information and has been referred to [place]. Patient agreed.  BSW updated CSW, Eduard Clos, on plan for BSW to remove self from care team. BSW also communicated to CSW lack of community resources for in home support that do not require the patient to private pay. BSW updated CSW that this BSW spoke with Morey Hummingbird, supervisor of adult services with [place], on today's date and was unsuccessful in establishing any resources patient may qualify for.  Daneen Schick, Arita Miss, CDP Social Worker [phone]

## 2018-05-18 NOTE — Patient Outreach (Signed)
[facility]) Care Management  05/18/2018  NELA BASCOM 1966-07-03 [id number]   CSW contacted pt today to screen for depression and support. CSW made contact with pt and introduced self and role after HIPPA compliant identity confirmed. Pt admits to depression and is open to a home visit.  Plans made for home visit this Friday. 05/22/2018 at [place], MSW, [place] Worker  [place] [phone]

## 2018-05-19 ENCOUNTER — Other Ambulatory Visit: Payer: Self-pay | Admitting: Neurology

## 2018-05-19 DIAGNOSIS — M5442 Lumbago with sciatica, left side: Secondary | ICD-10-CM | POA: Diagnosis not present

## 2018-05-19 DIAGNOSIS — G8929 Other chronic pain: Secondary | ICD-10-CM | POA: Diagnosis not present

## 2018-05-22 ENCOUNTER — Other Ambulatory Visit: Payer: Self-pay | Admitting: *Deleted

## 2018-05-22 ENCOUNTER — Encounter: Payer: Self-pay | Admitting: *Deleted

## 2018-05-22 ENCOUNTER — Ambulatory Visit: Payer: PPO | Admitting: Neurology

## 2018-05-22 NOTE — Patient Outreach (Signed)
[facility]) Care Management  [facility] Social Work  05/22/2018  Paige [name] September 21, 1966 [id number]  Subjective:  CSW met with pt in her home where she admits to long standing depression, history of SI (overdose) and ECT treatments.   Objective:  THN CSW to assist patient and family with community based resources to aide in their well-being, quality of life and overall safety and needs.   Encounter Medications:  Outpatient Encounter Medications as of 05/22/2018  Medication Sig Note  . buPROPion (WELLBUTRIN SR) 150 MG 12 hr tablet Take 450 mg by mouth daily.   . cetirizine (ZYRTEC) 10 MG tablet Take 1 tablet (10 mg total) by mouth daily.   . DULoxetine (CYMBALTA) 30 MG capsule Take 90 mg by mouth daily.   . furosemide (LASIX) 40 MG tablet Take 40 mg by mouth 2 (two) times daily.   Marland Kitchen gabapentin (NEURONTIN) 100 MG capsule Take 100 mg by mouth 3 (three) times daily.   Marland Kitchen ipratropium (ATROVENT) 0.03 % nasal spray Place 2 sprays into both nostrils as needed.    Marland Kitchen ketoconazole (NIZORAL) 2 % shampoo Apply 1 application topically daily.   Marland Kitchen levothyroxine (SYNTHROID, LEVOTHROID) 88 MCG tablet Take 88 mcg by mouth daily before breakfast.   . naproxen (NAPROSYN) 500 MG tablet TAKE 1 TABLET BY MOUTH EVERY 12 HOURS AS NEEDED   . nystatin (MYCOSTATIN/NYSTOP) powder Apply topically 2 (two) times daily.    Marland Kitchen nystatin cream (MYCOSTATIN) Apply 1 application topically daily as needed for dry skin.   Marland Kitchen olopatadine (PATANOL) 0.1 % ophthalmic solution Place 1 drop into both eyes daily as needed for allergies.    Marland Kitchen omeprazole (PRILOSEC) 20 MG capsule Take 20 mg by mouth daily.   . ondansetron (ZOFRAN-ODT) 4 MG disintegrating tablet Take 1 tablet (4 mg total) by mouth 3 (three) times daily before meals.   . potassium chloride (K-DUR,KLOR-CON) 10 MEQ tablet Take 1 tablet by mouth daily.   Marland Kitchen rOPINIRole (REQUIP) 2 MG tablet Take 2 mg by mouth 2 (two) times daily.   . traZODone (DESYREL) 100 MG  tablet Take 200 mg by mouth at bedtime.   Marland Kitchen CALCIUM-MAGNESIUM-VITAMIN D PO Take 2 tablets by mouth 2 (two) times daily.   . Coenzyme Q10 (COQ-10 PO) Take 240 mg by mouth daily.    . cycloSPORINE (RESTASIS) 0.05 % ophthalmic emulsion Place 1 drop into both eyes 2 (two) times daily.   Marland Kitchen LATUDA 20 MG TABS tablet Take 1 tablet by mouth daily with supper.  05/13/2018: Take every other day  . OVER THE COUNTER MEDICATION Take 1 tablet by mouth daily with supper. [name]   . OVER THE COUNTER MEDICATION Take 1 tablet by mouth. Rejuvinexx    No facility-administered encounter medications on file as of 05/22/2018.     Functional Status:  In your present state of health, do you have any difficulty performing the following activities: 05/22/2018 05/13/2018  Hearing? N N  Vision? Y Y  Difficulty concentrating or making decisions? [name]  Walking or climbing stairs? Y Y  Dressing or bathing? N N  Doing errands, shopping? [name]  Preparing Food and eating ? Y Y  Comment - reports she forgets she is cooking and burns her food.   Using the Toilet? N N  In the past six months, have you accidently leaked urine? Y Y  Do you have problems with loss of bowel control? N N  Managing your Medications? Y Y  Comment - is now  using a pill packing from [employer].   Managing your Finances? Y Y  Comment - forgotten to pay bills.  Has problems with compulsive spending.   Housekeeping or managing your Housekeeping? [name]  Some recent data might be hidden    Fall/Depression Screening:  PHQ 2/9 Scores 05/22/2018 05/13/2018 04/30/2018  PHQ - 2 Score _0 PHQ- 9 Score _1 Assessment:  Pt was pleasant, appropriate/alert and engaged during visit with CSW today.  Pt admits to depression; states its significantly improved in the last year but still rates it a "4" out of 10 with 10 being the best.   She currently goes to [facility] for RX management but has not been going to therapy/counseling because her therapist left  and they did not reassign her. She is open to seeing a therapist and is also open to seeing a Psychiatrist for assessment/treatment/RX recommendations.     Plan:  [facility] CM Care Plan Problem One     Most Recent Value  Care Plan Problem One  Patient with depression and anxiety impacting her quality of life.   Role Documenting the Problem One  Clinical Social Worker  Care Plan for Problem One  Active  [facility] Long Term Goal   Patient will report appointments arranged for outpatient mental health Psychiatry and counseling in the next 31 days.   THN Long Term Goal Start Date  05/22/18  Interventions for Problem One Long Term Goal  CSW   [facility] CM Short Term Goal #1   Patient will report 2 or more positive interventions she is coping with her anxiety and depression in the next 30 days.  THN CM Short Term Goal #1 Start Date  05/22/18  Interventions for Short Term Goal #1  CSW discussed deep breathing, therapy, journaling, activity and other opportunities with patient.    THN CM Short Term Goal #2   Patient will report receipt and review   of volunteer and community opportunity info   [facility] CM Short Term Goal #2 Start Date  05/22/18  Interventions for Short Term Goal #2  CSW will mail pt info on community based opportunities fot pt to engage with.      CSW will contact in network provider in [place] for appointments and update pt next week.   Eduard Clos, MSW, [place] Worker  [place] [phone]

## 2018-05-25 DIAGNOSIS — E876 Hypokalemia: Secondary | ICD-10-CM | POA: Diagnosis not present

## 2018-05-27 ENCOUNTER — Other Ambulatory Visit: Payer: Self-pay | Admitting: Pharmacy Technician

## 2018-05-27 NOTE — Patient Outreach (Signed)
[facility] [facility]) Care Management  05/27/2018  JILLIAN WARTH 1966/01/29 [id number]   Successful outreach call in regards to [place] patient assistance, HIPAA identifiers verified. Ms. Girgis states that she is still having trouble finding her LIS denial letter. Informed her that sometimes the company doesn't require it and she can mail in what she does have and we can start the application process. She stated that she would mail everything back in.  Will follow up with patient in 54-88 days if application has not been received.  Maud Deed [place], [address] Management [phone]

## 2018-05-28 ENCOUNTER — Ambulatory Visit (INDEPENDENT_AMBULATORY_CARE_PROVIDER_SITE_OTHER): Payer: PPO | Admitting: Neurology

## 2018-05-28 DIAGNOSIS — G43709 Chronic migraine without aura, not intractable, without status migrainosus: Secondary | ICD-10-CM | POA: Diagnosis not present

## 2018-05-28 DIAGNOSIS — IMO0002 Reserved for concepts with insufficient information to code with codable children: Secondary | ICD-10-CM

## 2018-05-28 MED ORDER — ONABOTULINUMTOXINA 100 UNITS IJ SOLR
155.0000 [IU] | Freq: Once | INTRAMUSCULAR | Status: AC
  Administered 2018-05-28: 155 [IU] via INTRAMUSCULAR

## 2018-05-28 NOTE — Progress Notes (Signed)
[facility]   Procedure Note Botox  Attending: Dr. Tomi Likens  Preoperative Diagnosis(es): Chronic migraine  Consent obtained from: The patient Benefits discussed included, but were not limited to decreased muscle tightness, increased joint range of motion, and decreased pain.  Risk discussed included, but were not limited pain and discomfort, bleeding, bruising, excessive weakness, venous thrombosis, muscle atrophy and dysphagia.  Anticipated outcomes of the procedure as well as he risks and benefits of the alternatives to the procedure, and the roles and tasks of the personnel to be involved, were discussed with the patient, and the patient consents to the procedure and agrees to proceed. A copy of the patient medication guide was given to the patient which explains the blackbox warning.  Patients identity and treatment sites confirmed Yes.  .  Details of Procedure: Skin was cleaned with alcohol. Prior to injection, the needle plunger was aspirated to make sure the needle was not within a blood vessel.  There was no blood retrieved on aspiration.    Following is a summary of the muscles injected  And the amount of Botulinum toxin used:  Dilution 200 units of Botox was reconstituted with 4 ml of preservative free normal saline. Time of reconstitution: At the time of the office visit (<30 minutes prior to injection)   Injections  155 total units of Botox was injected with a 30 gauge needle.  Injection Sites: L occipitalis: 15 units- 3 sites  R occiptalis: 15 units- 3 sites  L upper trapezius: 15 units- 3 sites R upper trapezius: 15 units- 3 sits          L paraspinal: 10 units- 2 sites R paraspinal: 10 units- 2 sites  Face L frontalis(2 injection sites):10 units   R frontalis(2 injection sites):10 units         L corrugator: 5 units   R corrugator: 5 units           Procerus: 5 units   L temporalis: 20 units R temporalis: 20 units   Agent:  200 units of botulinum Type A  (Onobotulinum Toxin type A) was reconstituted with 4 ml of preservative free normal saline.  Time of reconstitution: At the time of the office visit (<30 minutes prior to injection)     Total injected (Units): 155  Total wasted (Units): none wasted  Patient tolerated procedure well without complications.   Reinjection is anticipated in 3 months.

## 2018-05-29 DIAGNOSIS — L987 Excessive and redundant skin and subcutaneous tissue: Secondary | ICD-10-CM | POA: Diagnosis not present

## 2018-05-29 DIAGNOSIS — Z9884 Bariatric surgery status: Secondary | ICD-10-CM | POA: Diagnosis not present

## 2018-05-29 DIAGNOSIS — E65 Localized adiposity: Secondary | ICD-10-CM | POA: Diagnosis not present

## 2018-06-03 ENCOUNTER — Other Ambulatory Visit: Payer: Self-pay | Admitting: *Deleted

## 2018-06-03 DIAGNOSIS — F332 Major depressive disorder, recurrent severe without psychotic features: Secondary | ICD-10-CM | POA: Diagnosis not present

## 2018-06-03 NOTE — Patient Outreach (Signed)
  [facility]) Care Management  [facility] Social Work  06/03/2018  Paige [name] [birth year] [id number]    CSW was unable to reach pt by phone. A HIPPA compliant voice message was left. CSW will attempt to reach pt again if no return call received.   Eduard Clos, MSW, [place] Worker  [place] [phone]

## 2018-06-08 ENCOUNTER — Other Ambulatory Visit: Payer: Self-pay | Admitting: Pharmacy Technician

## 2018-06-08 DIAGNOSIS — M47816 Spondylosis without myelopathy or radiculopathy, lumbar region: Secondary | ICD-10-CM | POA: Diagnosis not present

## 2018-06-08 NOTE — Patient Outreach (Signed)
[facility]) Care Management  06/08/2018  CORTNI TAYS 1966-07-08 [id number]   Received patient portion of [employer] patient assistance application for Restasis. Faxed completed application into [employer].  Will follow up with [employer] in 3-4 weeks to check status of application.  Maud Deed [place], [place] Management [phone]

## 2018-06-15 ENCOUNTER — Other Ambulatory Visit: Payer: Self-pay

## 2018-06-15 NOTE — Patient Outreach (Signed)
Telephone assessment:  Placed call to patient who answered and reports that she feels weak. Reports she feels sluggish. Admits to doing some scraping booking which helps with her spirits. Reports home exercising.  Patient reports she had an injection to back and the numbness has decreased in her legs.   Patient denies any new problems or concerns. Reviewed plan of care with patient and concern for depression. Patient admits that she is working with [facility] social worker on counseling and depression.  No current nursing needs at this time. Reviewed case closure for nursing with patient and she is in agreement.  Plan: close to nursing.  Goals met. Will update THN team. Will send update to MD.  Tomasa Rand, RN, BSN, CEN [place] Coordinator [phone]

## 2018-06-16 ENCOUNTER — Other Ambulatory Visit: Payer: Self-pay | Admitting: *Deleted

## 2018-06-16 NOTE — Patient Outreach (Signed)
[facility]) Care Management  06/16/2018  Paige [name] August 26, 1966 [id number]   CSW made contact with pt today by phone. She reports she is awaiting a call back from her MD regarding pain that has returned (post injection on 06/12/18).  Pt provided with info on [facility] Concierge contact to call to determine in network providers for mental health counseling. Pt plans to call later today or this week once pain is better.  CSW offered support and will plan call back next week.  Eduard Clos, MSW, [place] Worker  [place] [phone]

## 2018-06-23 DIAGNOSIS — L819 Disorder of pigmentation, unspecified: Secondary | ICD-10-CM | POA: Diagnosis not present

## 2018-06-23 DIAGNOSIS — M533 Sacrococcygeal disorders, not elsewhere classified: Secondary | ICD-10-CM | POA: Diagnosis not present

## 2018-06-25 ENCOUNTER — Other Ambulatory Visit: Payer: Self-pay | Admitting: *Deleted

## 2018-06-25 NOTE — Patient Outreach (Signed)
[facility]) Care Management  [facility] Social Work  06/25/2018  Paige [name] [birth year] [id number]  Subjective:  "Each day my depression is better".  Objective: [facility] CSW to assist patient and family with community based resources to aide in their well-being, quality of life and overall safety and needs.     Encounter Medications:  Outpatient Encounter Medications as of 06/25/2018  Medication Sig Note  . buPROPion (WELLBUTRIN SR) 150 MG 12 hr tablet Take 450 mg by mouth daily.   Marland Kitchen CALCIUM-MAGNESIUM-VITAMIN D PO Take 2 tablets by mouth 2 (two) times daily.   . cetirizine (ZYRTEC) 10 MG tablet Take 1 tablet (10 mg total) by mouth daily.   . Coenzyme Q10 (COQ-10 PO) Take 240 mg by mouth daily.    . cycloSPORINE (RESTASIS) 0.05 % ophthalmic emulsion Place 1 drop into both eyes 2 (two) times daily.   . DULoxetine (CYMBALTA) 30 MG capsule Take 90 mg by mouth daily.   . furosemide (LASIX) 40 MG tablet Take 40 mg by mouth 2 (two) times daily.   Marland Kitchen gabapentin (NEURONTIN) 100 MG capsule Take 100 mg by mouth 3 (three) times daily.   Marland Kitchen ipratropium (ATROVENT) 0.03 % nasal spray Place 2 sprays into both nostrils as needed.    Marland Kitchen ketoconazole (NIZORAL) 2 % shampoo Apply 1 application topically daily.   Marland Kitchen LATUDA 20 MG TABS tablet Take 1 tablet by mouth daily with supper.  05/13/2018: Take every other day  . levothyroxine (SYNTHROID, LEVOTHROID) 88 MCG tablet Take 88 mcg by mouth daily before breakfast.   . naproxen (NAPROSYN) 500 MG tablet TAKE 1 TABLET BY MOUTH EVERY 12 HOURS AS NEEDED   . nystatin (MYCOSTATIN/NYSTOP) powder Apply topically 2 (two) times daily.    Marland Kitchen nystatin cream (MYCOSTATIN) Apply 1 application topically daily as needed for dry skin.   Marland Kitchen olopatadine (PATANOL) 0.1 % ophthalmic solution Place 1 drop into both eyes daily as needed for allergies.    Marland Kitchen omeprazole (PRILOSEC) 20 MG capsule Take 20 mg by mouth daily.   . ondansetron (ZOFRAN-ODT) 4 MG disintegrating tablet  Take 1 tablet (4 mg total) by mouth 3 (three) times daily before meals.   Marland Kitchen OVER THE COUNTER MEDICATION Take 1 tablet by mouth daily with supper. [name]   . OVER THE COUNTER MEDICATION Take 1 tablet by mouth. Rejuvinexx   . potassium chloride (K-DUR,KLOR-CON) 10 MEQ tablet Take 1 tablet by mouth daily.   Marland Kitchen rOPINIRole (REQUIP) 2 MG tablet Take 2 mg by mouth 2 (two) times daily.   . traZODone (DESYREL) 100 MG tablet Take 200 mg by mouth at bedtime.    No facility-administered encounter medications on file as of 06/25/2018.     Functional Status:  In your present state of health, do you have any difficulty performing the following activities: 05/22/2018 05/13/2018  Hearing? N N  Vision? Y Y  Difficulty concentrating or making decisions? [name]  Walking or climbing stairs? Y Y  Dressing or bathing? N N  Doing errands, shopping? [name]  Preparing Food and eating ? Y Y  Comment - reports she forgets she is cooking and burns her food.   Using the Toilet? N N  In the past six months, have you accidently leaked urine? Y Y  Do you have problems with loss of bowel control? N N  Managing your Medications? Y Y  Comment - is now using a pill packing from [employer].   Managing your Finances? [name]  Comment - forgotten to pay bills.  Has problems with compulsive spending.   Housekeeping or managing your Housekeeping? [name]  Some recent data might be hidden    Fall/Depression Screening:  PHQ 2/9 Scores 05/22/2018 05/13/2018 04/30/2018  PHQ - 2 Score '5 5 2  ' PHQ- 9 Score '20 18 13    ' Assessment:  [facility] CM Care Plan Problem One     Most Recent Value  Care Plan Problem One  Patient with depression and anxiety impacting her quality of life.   Role Documenting the Problem One  Clinical Social Worker  Care Plan for Problem One  Active  [facility] Long Term Goal   Patient will report appointments arranged for outpatient mental health Psychiatry and counseling in the next 31 days.   [facility] Long Term Goal Start Date   05/22/18  [facility] Long Term Goal Met Date  06/25/18  Interventions for Problem One Long Term Goal  Pt has not had time to arrange states she will soon.   [facility] CM Short Term Goal #1   Patient will report 2 or more positive interventions she is coping with her anxiety and depression in the next 30 days.  [facility] CM Short Term Goal #1 Start Date  05/22/18  [facility] CM Short Term Goal #1 Met Date  06/25/18  Interventions for Short Term Goal #1  Pt was able to share that her pain has lessened after a 2nd shot. She is driving, getting out today to visit mom and reports feeling less depressed "each day".  [facility] CM Short Term Goal #2          Plan:  CSW commended her for the positive steps she has taken and expressed. CSW encouraged her to make an appointment with a counselor/therapist; as she states she had not had time.   CSW offered support, praise and encouragement and will call back in 7-10 days for further updates.   Eduard Clos, MSW, [place] Worker  [place] [phone]

## 2018-06-29 DIAGNOSIS — D2271 Melanocytic nevi of right lower limb, including hip: Secondary | ICD-10-CM | POA: Diagnosis not present

## 2018-06-29 DIAGNOSIS — L821 Other seborrheic keratosis: Secondary | ICD-10-CM | POA: Diagnosis not present

## 2018-06-29 DIAGNOSIS — D485 Neoplasm of uncertain behavior of skin: Secondary | ICD-10-CM | POA: Diagnosis not present

## 2018-06-29 DIAGNOSIS — D2261 Melanocytic nevi of right upper limb, including shoulder: Secondary | ICD-10-CM | POA: Diagnosis not present

## 2018-06-29 DIAGNOSIS — Z85828 Personal history of other malignant neoplasm of skin: Secondary | ICD-10-CM | POA: Diagnosis not present

## 2018-06-29 DIAGNOSIS — D225 Melanocytic nevi of trunk: Secondary | ICD-10-CM | POA: Diagnosis not present

## 2018-06-29 DIAGNOSIS — D2272 Melanocytic nevi of left lower limb, including hip: Secondary | ICD-10-CM | POA: Diagnosis not present

## 2018-06-29 DIAGNOSIS — D2262 Melanocytic nevi of left upper limb, including shoulder: Secondary | ICD-10-CM | POA: Diagnosis not present

## 2018-06-29 DIAGNOSIS — L820 Inflamed seborrheic keratosis: Secondary | ICD-10-CM | POA: Diagnosis not present

## 2018-07-06 ENCOUNTER — Emergency Department (HOSPITAL_COMMUNITY)
Admission: EM | Admit: 2018-07-06 | Discharge: 2018-07-06 | Disposition: A | Discharge status: Home / Self Care | Payer: PPO | Attending: Emergency Medicine | Admitting: Emergency Medicine

## 2018-07-06 ENCOUNTER — Other Ambulatory Visit: Payer: Self-pay | Admitting: *Deleted

## 2018-07-06 ENCOUNTER — Encounter (HOSPITAL_COMMUNITY): Payer: Self-pay | Admitting: Emergency Medicine

## 2018-07-06 DIAGNOSIS — M62830 Muscle spasm of back: Secondary | ICD-10-CM | POA: Diagnosis not present

## 2018-07-06 DIAGNOSIS — E039 Hypothyroidism, unspecified: Secondary | ICD-10-CM | POA: Diagnosis not present

## 2018-07-06 DIAGNOSIS — F339 Major depressive disorder, recurrent, unspecified: Secondary | ICD-10-CM | POA: Diagnosis not present

## 2018-07-06 DIAGNOSIS — F411 Generalized anxiety disorder: Secondary | ICD-10-CM | POA: Diagnosis not present

## 2018-07-06 DIAGNOSIS — Z79899 Other long term (current) drug therapy: Secondary | ICD-10-CM | POA: Diagnosis not present

## 2018-07-06 DIAGNOSIS — M545 Low back pain, unspecified: Secondary | ICD-10-CM

## 2018-07-06 DIAGNOSIS — M62838 Other muscle spasm: Secondary | ICD-10-CM

## 2018-07-06 MED ORDER — ONDANSETRON HCL 4 MG/2ML IJ SOLN
4.0000 mg | Freq: Once | INTRAMUSCULAR | Status: AC
  Administered 2018-07-06: 4 mg via INTRAVENOUS
  Filled 2018-07-06: qty 2

## 2018-07-06 MED ORDER — FENTANYL CITRATE (PF) 100 MCG/2ML IJ SOLN
50.0000 ug | INTRAMUSCULAR | Status: DC | PRN
  Administered 2018-07-06: 50 ug via NASAL
  Filled 2018-07-06: qty 2

## 2018-07-06 MED ORDER — METHOCARBAMOL 500 MG PO TABS
1000.0000 mg | ORAL_TABLET | Freq: Once | ORAL | Status: AC
  Administered 2018-07-06: 1000 mg via ORAL
  Filled 2018-07-06: qty 2

## 2018-07-06 MED ORDER — HYDROCODONE-ACETAMINOPHEN 5-325 MG PO TABS: 1.0000 | ORAL_TABLET | Freq: Four times a day (QID) | ORAL | 0 refills | Status: DC | PRN

## 2018-07-06 MED ORDER — METHOCARBAMOL 500 MG PO TABS: 500.0000 mg | ORAL_TABLET | Freq: Two times a day (BID) | ORAL | 0 refills | Status: DC

## 2018-07-06 MED ORDER — HYDROMORPHONE HCL 1 MG/ML IJ SOLN
1.0000 mg | Freq: Once | INTRAMUSCULAR | Status: AC
  Administered 2018-07-06: 1 mg via INTRAVENOUS
  Filled 2018-07-06: qty 1

## 2018-07-06 MED ORDER — HYDROMORPHONE HCL 1 MG/ML IJ SOLN
1.0000 mg | Freq: Once | INTRAMUSCULAR | Status: DC
  Filled 2018-07-06: qty 1

## 2018-07-06 MED ORDER — HYDROCODONE-ACETAMINOPHEN 5-325 MG PO TABS
1.0000 | ORAL_TABLET | Freq: Once | ORAL | Status: AC
  Administered 2018-07-06: 1 via ORAL
  Filled 2018-07-06: qty 1

## 2018-07-06 MED ORDER — LORAZEPAM 2 MG/ML IJ SOLN
1.0000 mg | Freq: Once | INTRAMUSCULAR | Status: AC
  Administered 2018-07-06: 1 mg via INTRAVENOUS
  Filled 2018-07-06: qty 1

## 2018-07-06 MED ORDER — SODIUM CHLORIDE 0.9 % IV BOLUS
500.0000 mL | Freq: Once | INTRAVENOUS | Status: AC
  Administered 2018-07-06: 500 mL via INTRAVENOUS

## 2018-07-06 NOTE — ED Provider Notes (Signed)
[place] EMERGENCY DEPARTMENT Provider Note   CSN: [id number] Arrival date & time: 07/06/18  1132     History   Chief Complaint Chief Complaint  Patient presents with  . Back Pain    HPI Paige [name] is a 52 y.o. female.  Paige [name] is a 52 y.o. Female with a history of fibromyalgia, chronic pain, chronic fatigue, IBS and depression, who presents to the emergency department for evaluation of low back pain.  Patient reports she drove up from [place] to [place] today for a physical therapy session, but was not so much pain she was initially crying and unable to sit down due to pain, her close friend was called and transported her to the ED.  Upon arrival here she was able to get into the bed, but afterwards was crying and in pain and unable to move.  Patient reports she has been dealing with this back pain for the last several months, but has been acutely worse over the last few days.  She has not been taking anything at home for this pain, but reports she has been seeing Dr. Wilford Grist in [facility] whose been doing injections intermittently, but this has not helped with her pain.  She has not had any recent back imaging, did have an MRI of the brain last year which was unremarkable.  She reports pain is a constant ache across her low back that is worse with any movement, but especially movement of the lower extremities.  She reports she felt like she could not feel her left leg initially today and is having difficulty moving both of her legs.  She denies any loss of bowel or bladder control.  No fevers, no history of cancer or IV drug use.  Patient denies associated abdominal pain, chest pain or shortness of breath, denies any urinary symptoms.  History obtained via chart review and speaking with the patient's friend who accompanied her to the ED today.     Past Medical History:  Diagnosis Date  . Anemia   . Anxiety disorder   . Chronic fatigue     . Chronic headaches   . Chronic pain   . Depression   . Family history of colon cancer    mother,uncles,aunts  . Fibromyalgia   . Gallstones   . GERD (gastroesophageal reflux disease)   . Hypothyroidism   . IBS (irritable bowel syndrome)   . IBS (irritable bowel syndrome)   . Obesity   . Obesity   . Personal history of colonic polyps 04/2006   polypoid  . PONV (postoperative nausea and vomiting)     Patient Active Problem List   Diagnosis Date Noted  . Cervicalgia 02/12/2018  . Cough 05/23/2016  . Dyspnea 05/23/2016  . Chronic pain 05/22/2016  . Anemia, iron deficiency 08/24/2015  . Fibrositis 08/17/2015  . Abnormal weight gain 08/17/2015  . Anxiety 08/17/2015  . Recurrent major depressive episodes ([place]) 08/17/2015  . History of migraine headaches 08/17/2015  . Neurosis, posttraumatic 08/17/2015  . Bariatric surgery status 08/17/2015  . DDD (degenerative disc disease), lumbar 01/18/2015  . Facet joint syndrome 01/18/2015  . Degenerative arthritis of thoracic spine 01/18/2015  . Myofascial pain 02/22/2013  . Back ache 07/09/2012  . Family history of malignant neoplasm of gastrointestinal tract 06/10/2011  . Personal history of colonic polyps 06/10/2011  . Esophageal reflux 06/10/2011  . Cellulitis 06/10/2011  . Status post gastric bypass for obesity 06/10/2011  . B12 nutritional deficiency 06/10/2011  .  Obesity 06/10/2011  . Personal history of fibromyalgia 06/10/2011  . Depression, major, recurrent, in partial remission ([place]) 06/10/2011  . Acid reflux 06/10/2011    Past Surgical History:  Procedure Laterality Date  . ANTERIOR CRUCIATE LIGAMENT REPAIR Left 2003  . APPENDECTOMY  1993  . CARPAL TUNNEL RELEASE Left 09/25/2017   Procedure: LEFT CARPAL TUNNEL RELEASE;  Surgeon: Daryll Brod, MD;  Location: [facility];  Service: Orthopedics;  Laterality: Left;  . CHOLECYSTECTOMY  1999  . COLONOSCOPY  2009   [facility]  . REPAIR ANKLE LIGAMENT Left 2001   . ROUX-EN-Y GASTRIC BYPASS  04/2004   Dr Johnathan Hausen     OB History   None      Home Medications    Prior to Admission medications   Medication Sig Start Date End Date Taking? Authorizing Provider  buPROPion (WELLBUTRIN SR) 150 MG 12 hr tablet Take 450 mg by mouth daily.    [provider]  CALCIUM-MAGNESIUM-VITAMIN D PO Take 2 tablets by mouth 2 (two) times daily.    [provider]  cetirizine (ZYRTEC) 10 MG tablet Take 1 tablet (10 mg total) by mouth daily. 05/22/16   Tanda Rockers, MD  Coenzyme Q10 (COQ-10 PO) Take 240 mg by mouth daily.     [provider]  cycloSPORINE (RESTASIS) 0.05 % ophthalmic emulsion Place 1 drop into both eyes 2 (two) times daily.    [provider]  DULoxetine (CYMBALTA) 30 MG capsule Take 90 mg by mouth daily.    [provider]  furosemide (LASIX) 40 MG tablet Take 40 mg by mouth 2 (two) times daily.    [provider]  gabapentin (NEURONTIN) 100 MG capsule Take 100 mg by mouth 3 (three) times daily.    [provider]  ipratropium (ATROVENT) 0.03 % nasal spray Place 2 sprays into both nostrils as needed.  05/22/16   Tanda Rockers, MD  ketoconazole (NIZORAL) 2 % shampoo Apply 1 application topically daily.    [provider]  LATUDA 20 MG TABS tablet Take 1 tablet by mouth daily with supper.  03/19/18   [provider]  levothyroxine (SYNTHROID, LEVOTHROID) 88 MCG tablet Take 88 mcg by mouth daily before breakfast.    [provider]  naproxen (NAPROSYN) 500 MG tablet TAKE 1 TABLET BY MOUTH EVERY 12 HOURS AS NEEDED 05/19/18   Metta Clines R, DO  nystatin (MYCOSTATIN/NYSTOP) powder Apply topically 2 (two) times daily.  11/03/17   [provider]  nystatin cream (MYCOSTATIN) Apply 1 application topically daily as needed for dry skin.    [provider]  olopatadine (PATANOL) 0.1 % ophthalmic solution Place 1 drop into both eyes daily as needed for  allergies.     [provider]  omeprazole (PRILOSEC) 20 MG capsule Take 20 mg by mouth daily.    [provider]  ondansetron (ZOFRAN-ODT) 4 MG disintegrating tablet Take 1 tablet (4 mg total) by mouth 3 (three) times daily before meals. 05/12/17   Ladene Artist, MD  OVER THE COUNTER MEDICATION Take 1 tablet by mouth daily with supper. [name]    [provider]  OVER THE COUNTER MEDICATION Take 1 tablet by mouth. Rejuvinexx    [provider]  potassium chloride (K-DUR,KLOR-CON) 10 MEQ tablet Take 1 tablet by mouth daily. 03/04/18   [provider]  rOPINIRole (REQUIP) 2 MG tablet Take 2 mg by mouth 2 (two) times daily.    [provider]  traZODone (DESYREL) 100 MG tablet Take 200 mg by mouth at bedtime.    [provider]    Family History Family History  Problem Relation Age of Onset  . Colon cancer Mother        uncles,aunts  . Diabetes Father        paternal grandmother,sister  . Leukemia Father   . Lymphoma Father   . Skin cancer Father   . Heart disease Father   . Colon polyps Sister        1st degree cousins  . Diabetes Sister   . Stomach cancer Neg Hx     Social History Social History   Tobacco Use  . Smoking status: Never Smoker  . Smokeless tobacco: Never Used  Substance Use Topics  . Alcohol use: Not Currently    Alcohol/week: 0.0 oz    Comment: rarely  . Drug use: No     Allergies   Oxycodone-acetaminophen; Topamax [topiramate]; Sumatriptan; Flexeril [cyclobenzaprine hcl]; Imitrex [sumatriptan base]; Mobic [meloxicam]; Morphine and related; Percocet [oxycodone-acetaminophen]; and Pork (porcine) protein   Review of Systems Review of Systems  Constitutional: Negative for chills and fever.  HENT: Negative.   Eyes: Negative for visual disturbance.  Respiratory: Negative for cough, chest tightness and shortness of breath.   Cardiovascular: Negative for chest pain, palpitations and leg swelling.    Gastrointestinal: Negative for abdominal pain, constipation, diarrhea, nausea and vomiting.  Genitourinary: Negative for dysuria, frequency, vaginal bleeding and vaginal discharge.  Musculoskeletal: Positive for back pain. Negative for arthralgias, myalgias, neck pain and neck stiffness.  Skin: Negative for color change and rash.  Neurological: Positive for weakness and numbness. Negative for dizziness, tremors, seizures, syncope, facial asymmetry, speech difficulty, light-headedness and headaches.     Physical Exam Updated Vital Signs BP 121/67 (BP Location: Right Arm)   Pulse 84   Temp 97.7 F (36.5 C) (Oral)   Resp 20   Ht 5\' 9"  (1.753 m)   Wt 130.6 kg (288 lb)   SpO2 100%   BMI 42.53 kg/m   Physical Exam  Constitutional: She is oriented to person, place, and time. She appears well-developed and well-nourished.  On initial evaluation patient is tearful and appears very uncomfortable  HENT:  Head: Normocephalic and atraumatic.  Mouth/Throat: Oropharynx is clear and moist.  Eyes: Right eye exhibits no discharge. Left eye exhibits no discharge.  Neck: Normal range of motion. Neck supple.  No midline C-spine tenderness  Cardiovascular: Normal rate, regular rhythm, normal heart sounds and intact distal pulses.  Pulmonary/Chest: Effort normal and breath sounds normal. No respiratory distress.  Respirations equal and unlabored, patient able to speak in full sentences, lungs clear to auscultation bilaterally  Abdominal: Soft. Bowel sounds are normal. She exhibits no distension and no mass. There is no tenderness. There is no guarding.  Abdomen soft, nondistended, nontender to palpation in all quadrants without guarding or peritoneal signs  Musculoskeletal:  Tenderness to palpation across the low back with midline spinal tenderness, no overlying erythema or skin changes, no palpable deformity, pain worse with any palpation or movement 2+ DP and TP pulses in bilateral lower  extremities.  Neurological: She is alert and oriented to person, place, and time. Coordination normal.  Speech is clear and patient is following commands. Sensation intact throughout bilateral lower extremities Patient able to wiggle toes on bilateral lower extremities but is unable to lift either extremity off of the bed 2+ patellar DTRs bilaterally.  Skin: Skin is warm and dry. Capillary refill  takes less than 2 seconds. She is not diaphoretic.  Psychiatric: She has a normal mood and affect. Her behavior is normal.  Nursing note and vitals reviewed.    ED Treatments / Results  Labs (all labs ordered are listed, but only abnormal results are displayed) Labs Reviewed - No data to display  EKG None  Radiology No results found.  Procedures Procedures (including critical care time)  Medications Ordered in ED Medications  fentaNYL (SUBLIMAZE) injection 50 mcg (50 mcg Nasal Given 07/06/18 1150)  methocarbamol (ROBAXIN) tablet 1,000 mg (1,000 mg Oral Given 07/06/18 1308)  LORazepam (ATIVAN) injection 1 mg (1 mg Intravenous Given 07/06/18 1325)  HYDROmorphone (DILAUDID) injection 1 mg (1 mg Intravenous Given 07/06/18 1320)  sodium chloride 0.9 % bolus 500 mL (0 mLs Intravenous Stopped 07/06/18 1420)  ondansetron (ZOFRAN) injection 4 mg (4 mg Intravenous Given 07/06/18 1317)  HYDROcodone-acetaminophen (NORCO/VICODIN) 5-325 MG per tablet 1 tablet (1 tablet Oral Given 07/06/18 1600)     Initial Impression / Assessment and Plan / ED Course  I have reviewed the triage vital signs and the nursing notes.  Pertinent labs & imaging results that were available during my care of the patient were reviewed by me and considered in my medical decision making (see chart for details).  Patient presents for evaluation of acute on chronic low back pain with associated muscle spasms and difficulty moving her legs today.  She reported some subjective numbness in the left leg and weakness in both legs.  No loss of  bowel or bladder control, no cancer or IV drug use, no associated abdominal pain, chest pain or shortness of breath.  On exam patient has normal sensation throughout both legs, initially she was not able to move her legs off the bed, but after treatment with pain medication she has had significant improvement in her mobility and was able to get up and walk to the bathroom with one assist, her repeat neurologic exam shows 5/5 strength in bilateral lower extremities and is very reassuring I am not acutely concerned for cauda equina and she is low risk for an epidural abscess.  Do not think that MRI or further imaging is necessary here today.  Was able to obtain appointment for the patient with Dr. Patrice Paradise, with spine specialist for Wednesday morning for continued evaluation, will continue to treat with muscle relaxers and a small amount of pain medicine for breakthrough pain.  Return precautions discussed.  Patient expresses understanding and is in agreement with plan.  Final Clinical Impressions(s) / ED Diagnoses   Final diagnoses:  Acute bilateral low back pain without sciatica  Muscle spasm    ED Discharge Orders        Ordered    HYDROcodone-acetaminophen (NORCO/VICODIN) 5-325 MG tablet  Every 6 hours PRN     07/06/18 1548    methocarbamol (ROBAXIN) 500 MG tablet  2 times daily     07/06/18 1548       Benedetto Goad [place], [place] 07/06/18 1706    Maudie Flakes, MD 07/06/18 [phone]

## 2018-07-06 NOTE — ED Notes (Signed)
Pt ambulatory in room w/ 1 assist

## 2018-07-06 NOTE — ED Notes (Signed)
Patient verbalizes understanding of discharge instructions. Opportunity for questioning and answers were provided. Armband removed by staff, pt discharged from ED via wheelchair with family.  

## 2018-07-06 NOTE — Patient Outreach (Signed)
[facility]) Care Management  07/06/2018  Paige [name] November 24, 1966 [id number]   CSW made contact with pt briefly this morning who appeared to be in crisis mode. Her friend was en route to the hospital ER to take her because she had developed excruciating pain which was making her anxiety worse. CSW was only able to receive info from friend and call the [facility] ED to advise them the pt was heading there.   CSW will monitor pt's status in ED and plan a f/u call this week for further conversation, support and updates.   Eduard Clos, MSW, [place] Worker  [place] [phone]

## 2018-07-06 NOTE — Discharge Instructions (Addendum)
You have an appointment scheduled with Dr. Rennis Harding at 830 Wednesday morning, you will need to be there at 7:45 AM to fill out necessary new patient paperwork, please arrive with your insurance card and ID, there will be a $50 co-pay.  You may also contact [place] [facility] and see if they could see you any sooner or if they are in network with your insurance.  You may use Robaxin and hydrocodone in the meantime to manage her symptoms over the next few days.  Please return to the emergency department if you have significantly worsened pain, loss of bowel or bladder control, fevers, abdominal pain, or unable to walk or any other new or other concerning symptoms.

## 2018-07-06 NOTE — ED Triage Notes (Signed)
Pt. Crying and hardly not sitting in the chair cause of pain. This morning pain started around 0800, drove from [place] to [place] and called parents to bring hr to [place].

## 2018-07-06 NOTE — ED Notes (Signed)
Pt placed on pure wick at this time.  

## 2018-07-06 NOTE — ED Notes (Signed)
Patient pulse oximetry decreased to 88% room air while patient resting family friend at bedside. Placed on 3L [place]  Doctor notified while patient resting.

## 2018-07-06 NOTE — ED Notes (Signed)
Doctor at bedside.

## 2018-07-06 NOTE — ED Notes (Signed)
Assisted pt to bedpan while pt's family member stepped out of room to make phone call. Pt able to move hips without assistance and without pain. Pt not tearful or screaming out in pain.

## 2018-07-07 ENCOUNTER — Encounter (HOSPITAL_COMMUNITY): Payer: Self-pay

## 2018-07-07 ENCOUNTER — Emergency Department (HOSPITAL_COMMUNITY)
Admission: EM | Admit: 2018-07-07 | Discharge: 2018-07-07 | Disposition: A | Discharge status: Home / Self Care | Payer: PPO | Attending: Emergency Medicine | Admitting: Emergency Medicine

## 2018-07-07 DIAGNOSIS — M5442 Lumbago with sciatica, left side: Secondary | ICD-10-CM | POA: Diagnosis not present

## 2018-07-07 DIAGNOSIS — M545 Low back pain: Secondary | ICD-10-CM | POA: Diagnosis not present

## 2018-07-07 DIAGNOSIS — E039 Hypothyroidism, unspecified: Secondary | ICD-10-CM | POA: Diagnosis not present

## 2018-07-07 DIAGNOSIS — M5440 Lumbago with sciatica, unspecified side: Secondary | ICD-10-CM | POA: Insufficient documentation

## 2018-07-07 DIAGNOSIS — R110 Nausea: Secondary | ICD-10-CM | POA: Diagnosis not present

## 2018-07-07 DIAGNOSIS — M5489 Other dorsalgia: Secondary | ICD-10-CM | POA: Diagnosis not present

## 2018-07-07 DIAGNOSIS — M5441 Lumbago with sciatica, right side: Secondary | ICD-10-CM | POA: Diagnosis not present

## 2018-07-07 DIAGNOSIS — F329 Major depressive disorder, single episode, unspecified: Secondary | ICD-10-CM | POA: Insufficient documentation

## 2018-07-07 MED ORDER — PREDNISONE 20 MG PO TABS: 40.0000 mg | ORAL_TABLET | Freq: Every day | ORAL | 0 refills | Status: AC

## 2018-07-07 MED ORDER — HYDROMORPHONE HCL 1 MG/ML IJ SOLN
1.0000 mg | Freq: Once | INTRAMUSCULAR | Status: AC
  Administered 2018-07-07: 1 mg via INTRAVENOUS
  Filled 2018-07-07: qty 1

## 2018-07-07 MED ORDER — PREDNISONE 20 MG PO TABS
60.0000 mg | ORAL_TABLET | Freq: Once | ORAL | Status: AC
  Administered 2018-07-07: 60 mg via ORAL
  Filled 2018-07-07: qty 3

## 2018-07-07 MED ORDER — HYDROMORPHONE HCL 1 MG/ML IJ SOLN
0.5000 mg | Freq: Once | INTRAMUSCULAR | Status: AC
  Administered 2018-07-07: 0.5 mg via INTRAVENOUS
  Filled 2018-07-07: qty 1

## 2018-07-07 MED ORDER — KETOROLAC TROMETHAMINE 15 MG/ML IJ SOLN
15.0000 mg | Freq: Once | INTRAMUSCULAR | Status: AC
  Administered 2018-07-07: 15 mg via INTRAVENOUS
  Filled 2018-07-07: qty 1

## 2018-07-07 NOTE — ED Triage Notes (Signed)
Pt arrived via [facility]; pt from home with c/o back pain since January; started worsening yesterday; released yesterday without scans. Pt called Ortho for this afternoon; rolled out of bed and heard a "popping sound" pain shot from back to R leg; pt reports vomiting; 140/80; 66/20; 97% on RA

## 2018-07-07 NOTE — ED Notes (Signed)
ED Provider at bedside. 

## 2018-07-07 NOTE — Discharge Instructions (Addendum)
You were evaluated in the Emergency Department and after careful evaluation, we did not find any emergent condition requiring admission or further testing in the hospital.  Please follow-up with your scheduled specialist visit tomorrow and use of medications provided as needed for pain.  Please return to the Emergency Department if you experience any worsening of your condition.  We encourage you to follow up with a primary care provider.  Thank you for allowing [place] to be a part of your care.

## 2018-07-07 NOTE — ED Provider Notes (Signed)
[facility] Emergency Department Provider Note MRN:  [id number]  Arrival date & time: 07/07/18     Chief Complaint   Back Pain   History of Present Illness   Paige [name] is a 52 y.o. year-old female with a history of fibromyalgia and chronic low back pain presenting to the ED with chief complaint of back pain.  Patient was in the emergency department yesterday for an acute flare of her low back pain.  She felt better upon discharge, and she was going to follow-up with her specialist today.  When she did try to get out of bed this morning she felt a sudden pop in her lower back, followed by severe 10 out of 10 low back pain.  The pain is located in the bilateral lower back, nonradiating, constant, worse with motion.  Patient denies recent fevers or chills, no chest pain or shortness of breath, no abdominal pain.  No issues with bowel or bladder function.  No numbness or weakness.  Review of Systems  A complete 10 system review of systems was obtained and all systems are negative except as noted in the HPI and PMH.   Patient's Health History    Past Medical History:  Diagnosis Date  . Anemia   . Anxiety disorder   . Chronic fatigue   . Chronic headaches   . Chronic pain   . Depression   . Family history of colon cancer    mother,uncles,aunts  . Fibromyalgia   . Gallstones   . GERD (gastroesophageal reflux disease)   . Hypothyroidism   . IBS (irritable bowel syndrome)   . IBS (irritable bowel syndrome)   . Obesity   . Obesity   . Personal history of colonic polyps 04/2006   polypoid  . PONV (postoperative nausea and vomiting)     Past Surgical History:  Procedure Laterality Date  . ANTERIOR CRUCIATE LIGAMENT REPAIR Left 2003  . APPENDECTOMY  1993  . CARPAL TUNNEL RELEASE Left 09/25/2017   Procedure: LEFT CARPAL TUNNEL RELEASE;  Surgeon: Daryll Brod, MD;  Location: [place];  Service: Orthopedics;  Laterality: Left;  .  CHOLECYSTECTOMY  1999  . COLONOSCOPY  2009   [facility]  . REPAIR ANKLE LIGAMENT Left 2001  . ROUX-EN-Y GASTRIC BYPASS  04/2004   Dr Johnathan Hausen    Family History  Problem Relation Age of Onset  . Colon cancer Mother        uncles,aunts  . Diabetes Father        paternal grandmother,sister  . Leukemia Father   . Lymphoma Father   . Skin cancer Father   . Heart disease Father   . Colon polyps Sister        1st degree cousins  . Diabetes Sister   . Stomach cancer Neg Hx     Social History   Socioeconomic History  . Marital status: Single    Spouse name: Not on file  . Number of children: 0  . Years of education: Not on file  . Highest education level: Not on file  Occupational History  . Occupation: RN/disabled  Social Needs  . Financial resource strain: Not on file  . Food insecurity:    Worry: Not on file    Inability: Not on file  . Transportation needs:    Medical: Not on file    Non-medical: Not on file  Tobacco Use  . Smoking status: Never Smoker  . Smokeless tobacco: Never Used  Substance and Sexual Activity  . Alcohol use: Not Currently    Alcohol/week: 0.0 oz    Comment: rarely  . Drug use: No  . Sexual activity: Not on file  Lifestyle  . Physical activity:    Days per week: Not on file    Minutes per session: Not on file  . Stress: Not on file  Relationships  . Social connections:    Talks on phone: Not on file    Gets together: Not on file    Attends religious service: Not on file    Active member of club or organization: Not on file    Attends meetings of clubs or organizations: Not on file    Relationship status: Not on file  . Intimate partner violence:    Fear of current or ex partner: Not on file    Emotionally abused: Not on file    Physically abused: Not on file    Forced sexual activity: Not on file  Other Topics Concern  . Not on file  Social History Narrative  . Not on file     Physical Exam  Vital Signs and Nursing Notes  reviewed Vitals:   07/07/18 1603 07/07/18 1855  BP: 115/75 111/77  Pulse: (!) 56 60  Resp: 16 16  Temp:    SpO2: 98% 100%    CONSTITUTIONAL: Tired-appearing, tearful, moderate distress due to pain NEURO:  Alert and oriented x 3, no focal deficits EYES:  eyes equal and reactive ENT/NECK:  no LAD, no JVD CARDIO: Regular rate, well-perfused, normal S1 and S2 PULM:  CTAB no wheezing or rhonchi GI/GU:  normal bowel sounds, non-distended, non-tender MSK/SPINE:  No gross deformities, no edema SKIN:  no rash, atraumatic PSYCH:  Appropriate speech and behavior  Diagnostic and Interventional Summary    EKG Interpretation  Date/Time:    Ventricular Rate:    PR Interval:    QRS Duration:   QT Interval:    QTC Calculation:   R Axis:     Text Interpretation:        Labs Reviewed - No data to display  No orders to display    Medications  HYDROmorphone (DILAUDID) injection 1 mg (1 mg Intravenous Given 07/07/18 1604)  ketorolac (TORADOL) 15 MG/ML injection 15 mg (15 mg Intravenous Given 07/07/18 1605)  HYDROmorphone (DILAUDID) injection 0.5 mg (0.5 mg Intravenous Given 07/07/18 1722)  predniSONE (DELTASONE) tablet 60 mg (60 mg Oral Given 07/07/18 1856)     Procedures Critical Care  ED Course and Medical Decision Making  I have reviewed the triage vital signs and the nursing notes.  Pertinent labs & imaging results that were available during my care of the patient were reviewed by me and considered in my medical decision making (see below for details). Clinical Course as of Jul 08 2231  Tue Jul 07, 5266  2769 52 year old female history of fibromyalgia, chronic low back pain presenting to the ED for second day in a row with acute flare of chronic back pain.  Vital signs stable, afebrile, no history of IV drug use, does endorse continued depression and issues with her fibromyalgia but no SI, HI, AVH.  Again patient denies any urinary retention or bowel incontinence.  Denies numbness or  weakness, just limited by pain.  Had specialist follow-up today but pain was too severe upon awakening, came to the ED instead.  Given IV Dilaudid and Toradol here in the ED, reassessed multiple times again with reassuring neurological exam.  Nothing to  suggest cauda equina or spinal cord disturbance.  Options discussed with patient and patient's family member, they do have follow-up in place tomorrow with specialist.  MRI deferred tonight so that the specialist can arrange it at the appropriate time.  Given prescription for prednisone burst.After the discussed management above, the patient was determined to be safe for discharge.  The patient was in agreement with this plan and all questions regarding their care were answered.  ED return precautions were discussed and the patient will return to the ED with any significant worsening of condition.   [MB]    Clinical Course User Index [MB] Sedonia Small Barth Kirks, MD     Barth Kirks. Sedonia Small, MD [place] [email or web address]  Final Clinical Impressions(s) / ED Diagnoses     ICD-10-CM   1. Acute bilateral low back pain with sciatica, sciatica laterality unspecified M54.40     ED Discharge Orders        Ordered    predniSONE (DELTASONE) 20 MG tablet  Daily     07/07/18 1829         Maudie Flakes, MD 07/07/18 2232

## 2018-07-07 NOTE — ED Notes (Signed)
Patient verbalizes understanding of discharge instructions. Opportunity for questioning and answers were provided. Armband removed by staff, pt discharged from ED in wheelchair.  

## 2018-07-08 DIAGNOSIS — M797 Fibromyalgia: Secondary | ICD-10-CM | POA: Diagnosis not present

## 2018-07-08 DIAGNOSIS — M5416 Radiculopathy, lumbar region: Secondary | ICD-10-CM | POA: Diagnosis not present

## 2018-07-08 DIAGNOSIS — M5136 Other intervertebral disc degeneration, lumbar region: Secondary | ICD-10-CM | POA: Diagnosis not present

## 2018-07-08 DIAGNOSIS — M549 Dorsalgia, unspecified: Secondary | ICD-10-CM | POA: Diagnosis not present

## 2018-07-08 DIAGNOSIS — M545 Low back pain: Secondary | ICD-10-CM | POA: Diagnosis not present

## 2018-07-08 DIAGNOSIS — M4716 Other spondylosis with myelopathy, lumbar region: Secondary | ICD-10-CM | POA: Diagnosis not present

## 2018-07-09 ENCOUNTER — Other Ambulatory Visit: Payer: Self-pay | Admitting: *Deleted

## 2018-07-09 DIAGNOSIS — M5442 Lumbago with sciatica, left side: Secondary | ICD-10-CM | POA: Diagnosis not present

## 2018-07-09 DIAGNOSIS — R110 Nausea: Secondary | ICD-10-CM | POA: Diagnosis not present

## 2018-07-09 DIAGNOSIS — M5441 Lumbago with sciatica, right side: Secondary | ICD-10-CM | POA: Diagnosis not present

## 2018-07-09 NOTE — Patient Outreach (Signed)
[place] [facility]) Care Management  07/09/2018  Paige [name] Jul 31, 1966 [id number]   CSW contacted pt today for follow up from her ER visit earlier this week. She reports she is feeling better; pain has subsided some but is still there. She states she was leaving her mental health appointment when the pain resulted in the ER visit. Today she was seen by her PCP and given a Toradol injection as well as increased her Gapapentin. She is now waiting for an MRI; stating her insurance has not approved it yet.  CSW encouraged pt to call her insurance Concierge to inquire about the status of this request. "I guess it wouldn't hurt" and plans to call.  Pt also shared with CSW that her therapy appointment went well and she is planning to see the therapist each week; "Thursday's at 11". CSW praised pt for her willingness to attend and offered support and encouragement.  CSW will plan a call for updates next week.   THN CM Care Plan Problem One     Most Recent Value  Care Plan Problem One  Patient with depression and anxiety impacting her quality of life.   (Pended)   Role Documenting the Problem One  Clinical Social Worker  (Pended)   [place] for Problem One  Active  (Pended)   THN Long Term Goal   Patient will report appointments arranged for outpatient mental health Psychiatry and counseling in the next 31 days.   (Pended)   THN Long Term Goal Start Date  05/22/18  (Pended)   THN Long Term Goal Met Date  06/25/18  (Pended)   THN CM Short Term Goal #1   Patient will report 2 or more positive interventions she is coping with her anxiety and depression in the next 30 days.  (Pended)   THN CM Short Term Goal #1 Start Date  05/22/18  (Pended)   THN CM Short Term Goal #1 Met Date  06/25/18  (Pended)   THN CM Short Term Goal #2      (Pended)       Eduard Clos, MSW, [place] Worker  [place] [phone]

## 2018-07-14 ENCOUNTER — Other Ambulatory Visit: Payer: Self-pay | Admitting: Pharmacy Technician

## 2018-07-14 NOTE — Patient Outreach (Signed)
[place] [facility]) Care Management  07/14/2018  Paige [name] [birth year] [id number]   Follow up call to check patients application status for Restasis. Patient has been denied due to not meeting the eligibility requirements.  Will route note to [facility] for case closure.  Maud Deed [place], [place] Management [phone]

## 2018-07-15 DIAGNOSIS — M5136 Other intervertebral disc degeneration, lumbar region: Secondary | ICD-10-CM | POA: Diagnosis not present

## 2018-07-15 DIAGNOSIS — M4807 Spinal stenosis, lumbosacral region: Secondary | ICD-10-CM | POA: Diagnosis not present

## 2018-07-15 DIAGNOSIS — M5127 Other intervertebral disc displacement, lumbosacral region: Secondary | ICD-10-CM | POA: Diagnosis not present

## 2018-07-15 DIAGNOSIS — M48061 Spinal stenosis, lumbar region without neurogenic claudication: Secondary | ICD-10-CM | POA: Diagnosis not present

## 2018-07-15 DIAGNOSIS — M545 Low back pain: Secondary | ICD-10-CM | POA: Diagnosis not present

## 2018-07-15 DIAGNOSIS — N390 Urinary tract infection, site not specified: Secondary | ICD-10-CM | POA: Diagnosis not present

## 2018-07-16 ENCOUNTER — Other Ambulatory Visit: Payer: Self-pay | Admitting: Pharmacist

## 2018-07-16 DIAGNOSIS — F411 Generalized anxiety disorder: Secondary | ICD-10-CM | POA: Diagnosis not present

## 2018-07-16 DIAGNOSIS — F339 Major depressive disorder, recurrent, unspecified: Secondary | ICD-10-CM | POA: Diagnosis not present

## 2018-07-16 NOTE — Patient Outreach (Signed)
[facility]) Care Management  07/16/2018  Paige [name] 1966/02/05 [id number]  [facility] pharmacy assisting patient with medication assistance application for Restasis. Patient has been denied due to having prescription coverage through [employer].   Call placed to patient to update her on Restasis application.  Patient voiced understanding.  She reports she continues to use [facility] for compliance packs and is very happy with this service.   Plan: I will close [facility] pharmacy case. I am happy to assist in the future as needed.   Ralene Bathe, PharmD, [place] [phone]

## 2018-07-17 ENCOUNTER — Ambulatory Visit: Payer: Self-pay | Admitting: *Deleted

## 2018-07-21 ENCOUNTER — Other Ambulatory Visit: Payer: Self-pay | Admitting: *Deleted

## 2018-07-21 DIAGNOSIS — M545 Low back pain: Secondary | ICD-10-CM | POA: Diagnosis not present

## 2018-07-21 DIAGNOSIS — M4716 Other spondylosis with myelopathy, lumbar region: Secondary | ICD-10-CM | POA: Diagnosis not present

## 2018-07-21 DIAGNOSIS — M5416 Radiculopathy, lumbar region: Secondary | ICD-10-CM | POA: Diagnosis not present

## 2018-07-21 NOTE — Patient Outreach (Addendum)
[place] [facility]) Care Management  [facility] Social Work  07/21/2018  Paige [name] October 03, 1966 [id number]  Subjective:  "Feeling better since getting a diagnosis on my back pain" Objective: THN CSW to assist patient and family with community based resources to aide in their well-being, quality of life and overall safety and needs.    Current Medications:  Current Outpatient Medications  Medication Sig Dispense Refill  . buPROPion (WELLBUTRIN SR) 150 MG 12 hr tablet Take 450 mg by mouth daily.    Marland Kitchen CALCIUM-MAGNESIUM-VITAMIN D PO Take 2 tablets by mouth 2 (two) times daily.    . cetirizine (ZYRTEC) 10 MG tablet Take 1 tablet (10 mg total) by mouth daily. 1 tablet 0  . Coenzyme Q10 (COQ-10 PO) Take 240 mg by mouth daily.     . cycloSPORINE (RESTASIS) 0.05 % ophthalmic emulsion Place 1 drop into both eyes 2 (two) times daily.    . DULoxetine (CYMBALTA) 30 MG capsule Take 90 mg by mouth daily.    . furosemide (LASIX) 40 MG tablet Take 40 mg by mouth 2 (two) times daily.    Marland Kitchen gabapentin (NEURONTIN) 100 MG capsule Take 100 mg by mouth 3 (three) times daily.    Marland Kitchen HYDROcodone-acetaminophen (NORCO/VICODIN) 5-325 MG tablet Take 1-2 tablets by mouth every 6 (six) hours as needed. 10 tablet 0  . ipratropium (ATROVENT) 0.03 % nasal spray Place 2 sprays into both nostrils as needed.  30 mL 12  . ketoconazole (NIZORAL) 2 % shampoo Apply 1 application topically daily.    Marland Kitchen LATUDA 20 MG TABS tablet Take 1 tablet by mouth daily with supper.   0  . levothyroxine (SYNTHROID, LEVOTHROID) 88 MCG tablet Take 88 mcg by mouth daily before breakfast.    . methocarbamol (ROBAXIN) 500 MG tablet Take 1 tablet (500 mg total) by mouth 2 (two) times daily. 20 tablet 0  . naproxen (NAPROSYN) 500 MG tablet TAKE 1 TABLET BY MOUTH EVERY 12 HOURS AS NEEDED 16 tablet 3  . nystatin (MYCOSTATIN/NYSTOP) powder Apply topically 2 (two) times daily.     Marland Kitchen nystatin cream (MYCOSTATIN) Apply 1 application topically  daily as needed for dry skin.    Marland Kitchen olopatadine (PATANOL) 0.1 % ophthalmic solution Place 1 drop into both eyes daily as needed for allergies.     Marland Kitchen omeprazole (PRILOSEC) 20 MG capsule Take 20 mg by mouth daily.    . ondansetron (ZOFRAN-ODT) 4 MG disintegrating tablet Take 1 tablet (4 mg total) by mouth 3 (three) times daily before meals. 90 tablet 1  . OVER THE COUNTER MEDICATION Take 1 tablet by mouth daily with supper. [name]    . OVER THE COUNTER MEDICATION Take 1 tablet by mouth. Rejuvinexx    . potassium chloride (K-DUR,KLOR-CON) 10 MEQ tablet Take 1 tablet by mouth daily.    Marland Kitchen rOPINIRole (REQUIP) 2 MG tablet Take 2 mg by mouth 2 (two) times daily.    . traZODone (DESYREL) 100 MG tablet Take 200 mg by mouth at bedtime.     No current facility-administered medications for this visit.     Functional Status:  In your present state of health, do you have any difficulty performing the following activities: 05/22/2018 05/13/2018  Hearing? N N  Vision? Y Y  Difficulty concentrating or making decisions? Paige [name]  Walking or climbing stairs? Y Y  Dressing or bathing? N N  Doing errands, shopping? Paige [name]  Preparing Food and eating ? Y Y  Comment - reports she forgets she  is cooking and burns her food.   Using the Toilet? N N  In the past six months, have you accidently leaked urine? Y Y  Do you have problems with loss of bowel control? N N  Managing your Medications? Y Y  Comment - is now using a pill packing from [employer].   Managing your Finances? Y Y  Comment - forgotten to pay bills.  Has problems with compulsive spending.   Housekeeping or managing your Housekeeping? Y Y  Some recent data might be hidden    Fall/Depression Screening:  Fall Risk  05/13/2018 04/30/2018 04/07/2018  Falls in the past year? Yes Yes Yes  Number falls in past yr: 1 2 or more -  Injury with Fall? No Yes -  Risk for fall due to : - Impaired balance/gait -  Follow up Falls evaluation completed;Falls  prevention discussed - -   PHQ 2/9 Scores 05/22/2018 05/13/2018 04/30/2018  PHQ - 2 Score _0 PHQ- 9 Score _1 Assessment:  .CSW spoke with pt who indicates she has been told she has a "bulging disc and torn ligament" in her back.  They are planning to do an injection Sept 4th and she is relieved to know the reason for her pain and is hopeful the injection will help.  She reports this is helping her depression and overall outlook; "knowing what is causing it helps". She is still planning every other Thursday counseling with her Therapist. She is trying to take it easy and follow the restrictions suggested by spine Doctor.    THN CM Care Plan Problem One     Most Recent Value  Care Plan Problem One  Patient with depression and anxiety impacting her quality of life.   Role Documenting the Problem One  Clinical Social Worker  Care Plan for Problem One  Active  [facility] Long Term Goal   Patient will report appointments arranged for outpatient mental health Psychiatry and counseling in the next 31 days.   THN Long Term Goal Start Date  05/22/18  THN Long Term Goal Met Date  07/09/18  Interventions for Problem One Long Term Goal  Pt confirms she still has her every other week appointments set with therapist.   THN CM Short Term Goal #1      THN CM Short Term Goal #2            Plan:  CSW offered encouragement and support; talked with pt about ways to limit her bending, lifting activities.  CSW will plan f/u call after her planned injection.   Eduard Clos, MSW, [place] Worker  [place] [phone]

## 2018-07-24 DIAGNOSIS — M545 Low back pain: Secondary | ICD-10-CM | POA: Diagnosis not present

## 2018-07-24 DIAGNOSIS — M4716 Other spondylosis with myelopathy, lumbar region: Secondary | ICD-10-CM | POA: Diagnosis not present

## 2018-07-24 DIAGNOSIS — M5416 Radiculopathy, lumbar region: Secondary | ICD-10-CM | POA: Diagnosis not present

## 2018-07-30 DIAGNOSIS — F339 Major depressive disorder, recurrent, unspecified: Secondary | ICD-10-CM | POA: Diagnosis not present

## 2018-07-30 DIAGNOSIS — F411 Generalized anxiety disorder: Secondary | ICD-10-CM | POA: Diagnosis not present

## 2018-08-05 DIAGNOSIS — M545 Low back pain: Secondary | ICD-10-CM | POA: Diagnosis not present

## 2018-08-05 DIAGNOSIS — M5416 Radiculopathy, lumbar region: Secondary | ICD-10-CM | POA: Diagnosis not present

## 2018-08-06 ENCOUNTER — Ambulatory Visit: Payer: PPO | Admitting: *Deleted

## 2018-08-10 ENCOUNTER — Other Ambulatory Visit: Payer: Self-pay | Admitting: *Deleted

## 2018-08-10 NOTE — Patient Outreach (Signed)
[facility]) Care Management  [facility] Social Work  08/10/2018  Paige [name] [birth year] [id number]  Subjective:  "I am waiting for the injection to work"  Objective:  THN CSW to assist patient and family with community based resources to aide in their well-being, quality of life and overall safety and needs.   Encounter Medications:  Outpatient Encounter Medications as of 08/10/2018  Medication Sig Note  . buPROPion (WELLBUTRIN SR) 150 MG 12 hr tablet Take 450 mg by mouth daily.   Marland Kitchen CALCIUM-MAGNESIUM-VITAMIN D PO Take 2 tablets by mouth 2 (two) times daily.   . cetirizine (ZYRTEC) 10 MG tablet Take 1 tablet (10 mg total) by mouth daily.   . Coenzyme Q10 (COQ-10 PO) Take 240 mg by mouth daily.    . cycloSPORINE (RESTASIS) 0.05 % ophthalmic emulsion Place 1 drop into both eyes 2 (two) times daily.   . DULoxetine (CYMBALTA) 30 MG capsule Take 90 mg by mouth daily.   . furosemide (LASIX) 40 MG tablet Take 40 mg by mouth 2 (two) times daily.   Marland Kitchen gabapentin (NEURONTIN) 100 MG capsule Take 100 mg by mouth 3 (three) times daily.   Marland Kitchen HYDROcodone-acetaminophen (NORCO/VICODIN) 5-325 MG tablet Take 1-2 tablets by mouth every 6 (six) hours as needed.   Marland Kitchen ipratropium (ATROVENT) 0.03 % nasal spray Place 2 sprays into both nostrils as needed.    Marland Kitchen ketoconazole (NIZORAL) 2 % shampoo Apply 1 application topically daily.   Marland Kitchen LATUDA 20 MG TABS tablet Take 1 tablet by mouth daily with supper.  05/13/2018: Take every other day  . levothyroxine (SYNTHROID, LEVOTHROID) 88 MCG tablet Take 88 mcg by mouth daily before breakfast.   . methocarbamol (ROBAXIN) 500 MG tablet Take 1 tablet (500 mg total) by mouth 2 (two) times daily.   . naproxen (NAPROSYN) 500 MG tablet TAKE 1 TABLET BY MOUTH EVERY 12 HOURS AS NEEDED   . nystatin (MYCOSTATIN/NYSTOP) powder Apply topically 2 (two) times daily.    Marland Kitchen nystatin cream (MYCOSTATIN) Apply 1 application topically daily as needed for dry skin.   Marland Kitchen  olopatadine (PATANOL) 0.1 % ophthalmic solution Place 1 drop into both eyes daily as needed for allergies.    Marland Kitchen omeprazole (PRILOSEC) 20 MG capsule Take 20 mg by mouth daily.   . ondansetron (ZOFRAN-ODT) 4 MG disintegrating tablet Take 1 tablet (4 mg total) by mouth 3 (three) times daily before meals.   Marland Kitchen OVER THE COUNTER MEDICATION Take 1 tablet by mouth daily with supper. [name]   . OVER THE COUNTER MEDICATION Take 1 tablet by mouth. Rejuvinexx   . potassium chloride (K-DUR,KLOR-CON) 10 MEQ tablet Take 1 tablet by mouth daily.   Marland Kitchen rOPINIRole (REQUIP) 2 MG tablet Take 2 mg by mouth 2 (two) times daily.   . traZODone (DESYREL) 100 MG tablet Take 200 mg by mouth at bedtime.    No facility-administered encounter medications on file as of 08/10/2018.     Functional Status:  In your present state of health, do you have any difficulty performing the following activities: 05/22/2018 05/13/2018  Hearing? N N  Vision? Y Y  Difficulty concentrating or making decisions? [name]  Walking or climbing stairs? Y Y  Dressing or bathing? N N  Doing errands, shopping? [name]  Preparing Food and eating ? Y Y  Comment - reports she forgets she is cooking and burns her food.   Using the Toilet? N N  In the past six months, have you accidently leaked urine?  Y Y  Do you have problems with loss of bowel control? N N  Managing your Medications? Y Y  Comment - is now using a pill packing from [employer].   Managing your Finances? Y Y  Comment - forgotten to pay bills.  Has problems with compulsive spending.   Housekeeping or managing your Housekeeping? [name]  Some recent data might be hidden    Fall/Depression Screening:  PHQ 2/9 Scores 05/22/2018 05/13/2018 04/30/2018  PHQ - 2 Score _0 PHQ- 9 Score _1 Assessment:  Pt reports she had an injection last week for her back and is still awaiting and hopeful for it to work and reduce the pain.  She was able to go to her high school reunion but only able  to stay a short while because of the pain. She was told it can take a few weeks to improve.     Plan:  [facility] CM Care Plan Problem One     Most Recent Value  Care Plan Problem One  Patient with depression and anxiety impacting her quality of life.   Role Documenting the Problem One  Clinical Social Worker  Care Plan for Problem One  Active  [facility] Long Term Goal   Patient will report appointments arranged for outpatient mental health Psychiatry and counseling in the next 31 days.   THN Long Term Goal Start Date  05/22/18  THN Long Term Goal Met Date  07/09/18  Interventions for Problem One Long Term Goal  Pt continues to go weekly.  THN CM Short Term Goal #1      THN CM Short Term Goal #2          Pt reports her pain is a "smidgen better". She is hoping for relief and states she has been relying on her mom to drive her around because of the pain.  CSW encouraged her to seek input from MD about options for pain relief if this does not work. She asked that I call back at the end of this month to check in.   Eduard Clos, MSW, [place] Worker  [place] [phone]

## 2018-08-13 DIAGNOSIS — F411 Generalized anxiety disorder: Secondary | ICD-10-CM | POA: Diagnosis not present

## 2018-08-13 DIAGNOSIS — F339 Major depressive disorder, recurrent, unspecified: Secondary | ICD-10-CM | POA: Diagnosis not present

## 2018-08-19 DIAGNOSIS — F332 Major depressive disorder, recurrent severe without psychotic features: Secondary | ICD-10-CM | POA: Diagnosis not present

## 2018-08-20 DIAGNOSIS — M545 Low back pain: Secondary | ICD-10-CM | POA: Diagnosis not present

## 2018-08-20 DIAGNOSIS — M5106 Intervertebral disc disorders with myelopathy, lumbar region: Secondary | ICD-10-CM | POA: Diagnosis not present

## 2018-08-20 DIAGNOSIS — Z6829 Body mass index (BMI) 29.0-29.9, adult: Secondary | ICD-10-CM | POA: Diagnosis not present

## 2018-08-20 DIAGNOSIS — M5416 Radiculopathy, lumbar region: Secondary | ICD-10-CM | POA: Diagnosis not present

## 2018-08-21 ENCOUNTER — Ambulatory Visit: Payer: PPO | Admitting: Neurology

## 2018-08-26 DIAGNOSIS — M545 Low back pain: Secondary | ICD-10-CM | POA: Diagnosis not present

## 2018-08-26 DIAGNOSIS — M4716 Other spondylosis with myelopathy, lumbar region: Secondary | ICD-10-CM | POA: Diagnosis not present

## 2018-08-26 DIAGNOSIS — M5416 Radiculopathy, lumbar region: Secondary | ICD-10-CM | POA: Diagnosis not present

## 2018-08-26 DIAGNOSIS — M5106 Intervertebral disc disorders with myelopathy, lumbar region: Secondary | ICD-10-CM | POA: Diagnosis not present

## 2018-08-27 DIAGNOSIS — F411 Generalized anxiety disorder: Secondary | ICD-10-CM | POA: Diagnosis not present

## 2018-08-27 DIAGNOSIS — F339 Major depressive disorder, recurrent, unspecified: Secondary | ICD-10-CM | POA: Diagnosis not present

## 2018-08-28 ENCOUNTER — Ambulatory Visit (INDEPENDENT_AMBULATORY_CARE_PROVIDER_SITE_OTHER): Payer: PPO | Admitting: Neurology

## 2018-08-28 DIAGNOSIS — IMO0002 Reserved for concepts with insufficient information to code with codable children: Secondary | ICD-10-CM

## 2018-08-28 DIAGNOSIS — G43709 Chronic migraine without aura, not intractable, without status migrainosus: Secondary | ICD-10-CM | POA: Diagnosis not present

## 2018-08-28 MED ORDER — ONABOTULINUMTOXINA 100 UNITS IJ SOLR
155.0000 [IU] | Freq: Once | INTRAMUSCULAR | Status: AC
  Administered 2018-08-28: 155 [IU] via INTRAMUSCULAR

## 2018-08-28 NOTE — Progress Notes (Signed)
[facility]   Procedure Note Botox  Attending: Dr. [name]  Preoperative Diagnosis(es): Chronic migraine  Consent obtained from: The patient Benefits discussed included, but were not limited to decreased muscle tightness, increased joint range of motion, and decreased pain.  Risk discussed included, but were not limited pain and discomfort, bleeding, bruising, excessive weakness, venous thrombosis, muscle atrophy and dysphagia.  Anticipated outcomes of the procedure as well as he risks and benefits of the alternatives to the procedure, and the roles and tasks of the personnel to be involved, were discussed with the patient, and the patient consents to the procedure and agrees to proceed. A copy of the patient medication guide was given to the patient which explains the blackbox warning.  Patients identity and treatment sites confirmed Yes.  .  Details of Procedure: Skin was cleaned with alcohol. Prior to injection, the needle plunger was aspirated to make sure the needle was not within a blood vessel.  There was no blood retrieved on aspiration.    Following is a summary of the muscles injected  And the amount of Botulinum toxin used:  Dilution 200 units of Botox was reconstituted with 4 ml of preservative free normal saline. Time of reconstitution: At the time of the office visit (<30 minutes prior to injection)   Injections  155 total units of Botox was injected with a 30 gauge needle.  Injection Sites: L occipitalis: 15 units- 3 sites  R occiptalis: 15 units- 3 sites  L upper trapezius: 15 units- 3 sites R upper trapezius: 15 units- 3 sits          L paraspinal: 10 units- 2 sites R paraspinal: 10 units- 2 sites  Face L frontalis(2 injection sites):10 units   R frontalis(2 injection sites):10 units         L corrugator: 5 units   R corrugator: 5 units           Procerus: 5 units   L temporalis: 20 units R temporalis: 20 units   Agent:  200 units of botulinum Type A  (Onobotulinum Toxin type A) was reconstituted with 4 ml of preservative free normal saline.  Time of reconstitution: At the time of the office visit (<30 minutes prior to injection)     Total injected (Units): 155  Total wasted (Units): 10  Patient tolerated procedure well without complications.   Reinjection is anticipated in 3 months.   

## 2018-08-31 ENCOUNTER — Other Ambulatory Visit: Payer: Self-pay | Admitting: *Deleted

## 2018-08-31 NOTE — Patient Outreach (Signed)
[facility]) Care Management  [facility] Social Work  08/31/2018  JORENE KAYLOR 1966/01/28 [id number]  Subjective:  " we are talking about a lot of stuff"  (at Counselor sessions).  Objective: [facility] CSW to assist patient and family with community based resources to aide in their well-being, quality of life and overall safety and needs.    Encounter Medications:  Outpatient Encounter Medications as of 08/31/2018  Medication Sig Note  . buPROPion (WELLBUTRIN SR) 150 MG 12 hr tablet Take 450 mg by mouth daily.   Marland Kitchen CALCIUM-MAGNESIUM-VITAMIN D PO Take 2 tablets by mouth 2 (two) times daily.   . cetirizine (ZYRTEC) 10 MG tablet Take 1 tablet (10 mg total) by mouth daily.   . Coenzyme Q10 (COQ-10 PO) Take 240 mg by mouth daily.    . cycloSPORINE (RESTASIS) 0.05 % ophthalmic emulsion Place 1 drop into both eyes 2 (two) times daily.   . DULoxetine (CYMBALTA) 30 MG capsule Take 90 mg by mouth daily.   . furosemide (LASIX) 40 MG tablet Take 40 mg by mouth 2 (two) times daily.   Marland Kitchen gabapentin (NEURONTIN) 100 MG capsule Take 100 mg by mouth 3 (three) times daily.   Marland Kitchen HYDROcodone-acetaminophen (NORCO/VICODIN) 5-325 MG tablet Take 1-2 tablets by mouth every 6 (six) hours as needed.   Marland Kitchen ipratropium (ATROVENT) 0.03 % nasal spray Place 2 sprays into both nostrils as needed.    Marland Kitchen ketoconazole (NIZORAL) 2 % shampoo Apply 1 application topically daily.   Marland Kitchen LATUDA 20 MG TABS tablet Take 1 tablet by mouth daily with supper.  05/13/2018: Take every other day  . levothyroxine (SYNTHROID, LEVOTHROID) 88 MCG tablet Take 88 mcg by mouth daily before breakfast.   . methocarbamol (ROBAXIN) 500 MG tablet Take 1 tablet (500 mg total) by mouth 2 (two) times daily.   . naproxen (NAPROSYN) 500 MG tablet TAKE 1 TABLET BY MOUTH EVERY 12 HOURS AS NEEDED   . nystatin (MYCOSTATIN/NYSTOP) powder Apply topically 2 (two) times daily.    Marland Kitchen nystatin cream (MYCOSTATIN) Apply 1 application topically daily as needed  for dry skin.   Marland Kitchen olopatadine (PATANOL) 0.1 % ophthalmic solution Place 1 drop into both eyes daily as needed for allergies.    Marland Kitchen omeprazole (PRILOSEC) 20 MG capsule Take 20 mg by mouth daily.   . ondansetron (ZOFRAN-ODT) 4 MG disintegrating tablet Take 1 tablet (4 mg total) by mouth 3 (three) times daily before meals.   Marland Kitchen OVER THE COUNTER MEDICATION Take 1 tablet by mouth daily with supper. [name]   . OVER THE COUNTER MEDICATION Take 1 tablet by mouth. Rejuvinexx   . potassium chloride (K-DUR,KLOR-CON) 10 MEQ tablet Take 1 tablet by mouth daily.   Marland Kitchen rOPINIRole (REQUIP) 2 MG tablet Take 2 mg by mouth 2 (two) times daily.   . traZODone (DESYREL) 100 MG tablet Take 200 mg by mouth at bedtime.    No facility-administered encounter medications on file as of 08/31/2018.     Functional Status:  In your present state of health, do you have any difficulty performing the following activities: 05/22/2018 05/13/2018  Hearing? N N  Vision? Y Y  Difficulty concentrating or making decisions? [name]  Walking or climbing stairs? Y Y  Dressing or bathing? N N  Doing errands, shopping? [name]  Preparing Food and eating ? Y Y  Comment - reports she forgets she is cooking and burns her food.   Using the Toilet? N N  In the past six months,  have you accidently leaked urine? Y Y  Do you have problems with loss of bowel control? N N  Managing your Medications? Y Y  Comment - is now using a pill packing from [employer].   Managing your Finances? Y Y  Comment - forgotten to pay bills.  Has problems with compulsive spending.   Housekeeping or managing your Housekeeping? [name]  Some recent data might be hidden    Fall/Depression Screening:  PHQ 2/9 Scores 05/22/2018 05/13/2018 04/30/2018  PHQ - 2 Score '5 5 2  ' PHQ- 9 Score '20 18 13    ' Assessment: Spoke with pt  By phone who is in good spirits today. She reports less depression and has been able to enjoy more outings/activity and less pain. She has been approved  for paniculectomy surgery and is figuring out her plans.  She has also been able to enjoy some crafty projects and shares that she won first prize for something she made.    Plan:  [facility] CM Care Plan Problem One     Most Recent Value  Care Plan Problem One  Patient with depression and anxiety impacting her quality of life.   Role Documenting the Problem One  Clinical Social Worker  Care Plan for Problem One  Active  [facility] Long Term Goal   Patient will report appointments arranged for outpatient mental health Psychiatry and counseling in the next 31 days.   [facility] Long Term Goal Start Date  05/22/18  [facility] Long Term Goal Met Date  07/09/18  Interventions for Problem One Long Term Goal  pt continues to see her counselor :every other week. and reports positive sessions.   [facility] CM Short Term Goal #1      [facility] CM Short Term Goal #2               pt plans to continue to go to counseling every other week and will update CSW on overall progress in 10-14 days.    Eduard Clos, MSW, [place] Worker  [place] [phone]

## 2018-09-02 DIAGNOSIS — Z6828 Body mass index (BMI) 28.0-28.9, adult: Secondary | ICD-10-CM | POA: Diagnosis not present

## 2018-09-02 DIAGNOSIS — L987 Excessive and redundant skin and subcutaneous tissue: Secondary | ICD-10-CM | POA: Diagnosis not present

## 2018-09-02 DIAGNOSIS — I890 Lymphedema, not elsewhere classified: Secondary | ICD-10-CM | POA: Diagnosis not present

## 2018-09-02 DIAGNOSIS — E663 Overweight: Secondary | ICD-10-CM | POA: Diagnosis not present

## 2018-09-04 DIAGNOSIS — E65 Localized adiposity: Secondary | ICD-10-CM | POA: Diagnosis not present

## 2018-09-07 NOTE — Progress Notes (Deleted)
NEUROBEHAVIORAL STATUS EXAM   Name: Paige [name] Date of Birth: [birth year] Date of Interview: 09/07/2018  Reason for Referral:  Paige [name] is a 52 y.o. female who is referred for neuropsychological evaluation by Dr. Metta Clines of [facility] Neurology due to concerns about memory decline. This patient is accompanied in the office by her *** who supplements the history.  History of Presenting Problem:  Paige [name] most recent 04/07/2018 for memory, MMSE 29/30 Sees for migraines, presented on this date for memory x about 2 years She saw a speech pathologist who told her she has memory deficits.  Brain MRI 04/17/2018 IMPRESSION: 1. No acute intracranial process. 2. Stable RIGHT posterior insula susceptibility artifact, possible developmental venous anomaly or old hemorrhage/injury. 3. Otherwise unremarkable noncontrast MRI of the head.   Onset/Course  Upon direct questioning, the patient/caregiver reported:   Forgetting recent conversations/events:  Repeating statements/questions: Misplacing/losing items: Forgetting appointments or other obligations: Forgetting to take medications:  Difficulty concentrating: Starting but not finishing tasks: Distracted easily: Processing information more slowly:  Word-finding difficulty: Word substitutions: Writing difficulty: Spelling difficulty: Comprehension difficulty:  Getting lost when driving: Making wrong turns when driving: Uncertain about directions when driving or passenger:    Family neuro hx: Any family hx dementia?   Current Functioning: Work:  Complex ADLs Driving: Medication management: Management of finances: Appointments: Cooking:     Medical/Physical complaints:  Any hx of stroke/TIA, MI, LOC/TBI, Sz? Hx falls?  Balance, probs walking? Sleep: Insomnia? OSA? CPAP? REM sleep beh sx? Visual illusions/hallucinations? Appetite/Nutrition/Weight changes       Current mood:  Depressed  mood Anxiety Stress  Behavioral disturbance/Personality change  Suicidal Ideation/Intention:   Psychiatric History: History of depression, anxiety, other MH disorder: History of MH treatment: History of SI: History of substance dependence/treatment:   Social History: Born/Raised:  Education: Occupational history: Marital history: Children: Alcohol:  Tobacco: SA:  Medical History: Past Medical History:  Diagnosis Date  . Anemia   . Anxiety disorder   . Chronic fatigue   . Chronic headaches   . Chronic pain   . Depression   . Family history of colon cancer    mother,uncles,aunts  . Fibromyalgia   . Gallstones   . GERD (gastroesophageal reflux disease)   . Hypothyroidism   . IBS (irritable bowel syndrome)   . IBS (irritable bowel syndrome)   . Obesity   . Obesity   . Personal history of colonic polyps 04/2006   polypoid  . PONV (postoperative nausea and vomiting)       Current Medications:  Outpatient Encounter Medications as of 09/08/2018  Medication Sig  . buPROPion (WELLBUTRIN SR) 150 MG 12 hr tablet Take 450 mg by mouth daily.  Marland Kitchen CALCIUM-MAGNESIUM-VITAMIN D PO Take 2 tablets by mouth 2 (two) times daily.  . cetirizine (ZYRTEC) 10 MG tablet Take 1 tablet (10 mg total) by mouth daily.  . Coenzyme Q10 (COQ-10 PO) Take 240 mg by mouth daily.   . cycloSPORINE (RESTASIS) 0.05 % ophthalmic emulsion Place 1 drop into both eyes 2 (two) times daily.  . DULoxetine (CYMBALTA) 30 MG capsule Take 90 mg by mouth daily.  . furosemide (LASIX) 40 MG tablet Take 40 mg by mouth 2 (two) times daily.  Marland Kitchen gabapentin (NEURONTIN) 100 MG capsule Take 100 mg by mouth 3 (three) times daily.  Marland Kitchen HYDROcodone-acetaminophen (NORCO/VICODIN) 5-325 MG tablet Take 1-2 tablets by mouth every 6 (six) hours as needed.  Marland Kitchen ipratropium (ATROVENT) 0.03 % nasal spray Place 2 sprays  into both nostrils as needed.   Marland Kitchen ketoconazole (NIZORAL) 2 % shampoo Apply 1 application topically daily.  Marland Kitchen LATUDA 20  MG TABS tablet Take 1 tablet by mouth daily with supper.   . levothyroxine (SYNTHROID, LEVOTHROID) 88 MCG tablet Take 88 mcg by mouth daily before breakfast.  . methocarbamol (ROBAXIN) 500 MG tablet Take 1 tablet (500 mg total) by mouth 2 (two) times daily.  . naproxen (NAPROSYN) 500 MG tablet TAKE 1 TABLET BY MOUTH EVERY 12 HOURS AS NEEDED  . nystatin (MYCOSTATIN/NYSTOP) powder Apply topically 2 (two) times daily.   Marland Kitchen nystatin cream (MYCOSTATIN) Apply 1 application topically daily as needed for dry skin.  Marland Kitchen olopatadine (PATANOL) 0.1 % ophthalmic solution Place 1 drop into both eyes daily as needed for allergies.   Marland Kitchen omeprazole (PRILOSEC) 20 MG capsule Take 20 mg by mouth daily.  . ondansetron (ZOFRAN-ODT) 4 MG disintegrating tablet Take 1 tablet (4 mg total) by mouth 3 (three) times daily before meals.  Marland Kitchen OVER THE COUNTER MEDICATION Take 1 tablet by mouth daily with supper. [name]  . OVER THE COUNTER MEDICATION Take 1 tablet by mouth. Rejuvinexx  . potassium chloride (K-DUR,KLOR-CON) 10 MEQ tablet Take 1 tablet by mouth daily.  Marland Kitchen rOPINIRole (REQUIP) 2 MG tablet Take 2 mg by mouth 2 (two) times daily.  . traZODone (DESYREL) 100 MG tablet Take 200 mg by mouth at bedtime.   No facility-administered encounter medications on file as of 09/08/2018.      Behavioral Observations:   Appearance: Neatly, casually and appropriately dressed and groomed*** Gait: Ambulated independently, no gross abnormalities observed*** Speech: Fluent; normal rate, rhythm and volume. *** word finding difficulty. Thought process: Linear, goal directed*** Affect: Full, anxious*** Interpersonal: Pleasant, appropriate***   *** minutes spent face-to-face with patient completing neurobehavioral status exam. *** minutes spent integrating medical records/clinical data and completing this report. [id number] unit; [id number]***.   TESTING: There is medical necessity to proceed with neuropsychological assessment as the results  will be used to aid in differential diagnosis and clinical decision-making and to inform specific treatment recommendations. Per the patient, *** and medical records reviewed, there has been a change in cognitive functioning and a reasonable suspicion of dementia***.  Clinical Decision Making: In considering the patient's current level of functioning, level of presumed impairment, nature of symptoms, emotional and behavioral responses during the interview, level of literacy, and observed level of motivation, a battery of tests was selected and communicated to the psychometrician.   ***Option 1:  Following the clinical interview/neurobehavioral status exam, the patient completed this full battery of neuropsychological testing with my psychometrician under my supervision (see separate note).   PLAN: The patient will return to see me for a follow-up session at which time her test performances and my impressions and treatment recommendations will be reviewed in detail.  Evaluation ongoing; full report to follow.  ***Option 2:  PLAN: The patient will return to complete the above referenced full battery of neuropsychological testing with a psychometrician under my supervision. Education regarding testing procedures was provided to the patient. Subsequently, the patient will see this provider for a follow-up session at which time her test performances and my impressions and treatment recommendations will be reviewed in detail.  Evaluation ongoing; full report to follow.

## 2018-09-08 ENCOUNTER — Encounter: Payer: PPO | Admitting: Psychology

## 2018-09-08 DIAGNOSIS — M5416 Radiculopathy, lumbar region: Secondary | ICD-10-CM | POA: Diagnosis not present

## 2018-09-11 ENCOUNTER — Encounter: Payer: Self-pay | Admitting: Psychology

## 2018-09-11 ENCOUNTER — Ambulatory Visit: Payer: Self-pay | Admitting: *Deleted

## 2018-09-11 ENCOUNTER — Ambulatory Visit (INDEPENDENT_AMBULATORY_CARE_PROVIDER_SITE_OTHER): Payer: PPO | Admitting: Psychology

## 2018-09-11 DIAGNOSIS — R413 Other amnesia: Secondary | ICD-10-CM

## 2018-09-11 DIAGNOSIS — F339 Major depressive disorder, recurrent, unspecified: Secondary | ICD-10-CM

## 2018-09-11 NOTE — Progress Notes (Signed)
NEUROBEHAVIORAL STATUS EXAM   Name: Paige [name] Date of Birth: 06-Nov-1966 Date of Interview: 09/11/2018  Reason for Referral:  Paige [name] is a 52 y.o. female who is referred for neuropsychological evaluation by Dr. Metta Clines of [facility] Neurology due to concerns about memory deficits. This patient is accompanied in the office by her mother who supplements the history.  History of Presenting Problem:  Paige [name] is followed by Dr. Tomi Likens for migraines. When she saw Dr. Tomi Likens on 04/07/2018 she reported concerns about declining memory. MMSE was 29/30. It was noted she had see a speech pathologist who told her she had memory deficits (she states she was seeing a speech therapist for difficulty projecting her voice). The patient also has fibromyalgia, chronic pain, and depression. She had a brain MRI on 04/17/2018 which revealed no acute intracranial process, stable right posterior insula susceptibility artifact, possible developmental venous anomaly or old hemorrhage/injury, otherwise unremarkable.  The patient reports first experiencing memory loss when she was undergoing ECT treatments for depression in the 1990s. She had 18 ECT treatments total. Her mother also observed memory loss at that time. However, it has worsened more recently and is concerning to both of them. The patient reports daily forgetfulness and distractibility. She notes she sometimes gets confused about where she is when she is driving. She lives alone but her mother provides significant support. The patient is disabled and has not worked in several years.  She has a significant history of chronic depression which she reports started after a traumatic experience in college. She has been tried on multiple antidepressants, has worked with numerous therapists/counselors (although she does not think she has ever done trauma focused work in therapy), has had several inpatient hospitalizations for suicidal ideation and  attempt, and as noted previously has undergone ECT. Last inpatient psychiatric hospitalization was approximately 8 years ago per her mother. Her mother reports the patient's depression has been chronic and treatment-resistant. When the patient's depression has been severe, psychotic features have been present. There is family history of depression. No other known family history of severe mental illness.   There is family history of dementia in the patient's maternal aunt and maternal uncle (both in their 68s presently, with symptoms beginning in their 18s or 74s).   Upon direct questioning, the patient/caregiver reported the following with regard to current cognitive functioning:   Forgetting recent conversations/events: Yes Repeating statements/questions: Yes Misplacing/losing items: Yes Forgetting appointments or other obligations: Yes Forgetting to take medications: Still misses occasionally, but is doing better now that pharmacy pill-packs  Difficulty concentrating: Yes Starting but not finishing tasks/Distractibility: Yes, this is a major issue Processing information more slowly: Yes  Word-finding difficulty: Yes Comprehension difficulty: Yes occasionally Reading comprehension: She cannot concentrate well enough to read, so she doesn't read anymore, she used to love reading  Getting lost when driving: She called her mother on one occasion when she got lost. Most of the time GPS helps. Uncertain about directions when driving or passenger: Occasionally   As noted previously, the patient lives alone. She independently manages her driving, cooking and medications. Her mother assists with managing finances and appointments.  The patient complains of horrible back pain, current pain level today is 6/10. She was in the ED twice last month for back pain. She denies taking any pain medication, stating "it doesn't help".  She has no known history of head injury.  She has difficulty with sleep  maintenance. Medication helps her fall asleep  but she wakes up after 3 hours and usually cannot return to sleep. She sometimes is able to nap.  The patient reports her mood "depends on the minute". She notes that she had a pretty good day yesterday, she was with a couple of friends "which doesn't happen very often". She mostly spends time with her mother. They are very close. Her mother states that the patient is "extremely dependent on me and I probably foster more of that than I really want to.Marland KitchenMarland KitchenI feel like sometimes I try to encourage her to be more independent but then she will feel like I'm pushing her away and I don't want her to feel that way." They have never done family therapy together. The patient is currently seeing a therapist once a month. She is not sure if it is helpful. She goes to [facility] for psychiatry/medication management. She denies suicidal ideation or intention currently or in the past month. She denies current psychosis.   She denies history of substance abuse or dependence. When asked about any history of addiction, she states "only to food".   Social History: Born/Raised: [place] Her mother reports she excelled in school and did not demonstrate any behavioral or mood difficulties throughout childhood or adolescence.  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 Anemia   . Anxiety disorder   . Chronic fatigue   . Chronic headaches   . Chronic pain   . Depression   . Family history of colon cancer    mother,uncles,aunts  . Fibromyalgia   . Gallstones   . GERD (gastroesophageal reflux disease)   . Hypothyroidism   . IBS (irritable bowel syndrome)   . IBS (irritable bowel  syndrome)   . Obesity   . Obesity   . Personal history of colonic polyps 04/2006   polypoid  . PONV (postoperative nausea and vomiting)       Current Medications:  Outpatient Encounter Medications as of 09/11/2018  Medication Sig  . buPROPion (WELLBUTRIN SR) 150 MG 12 hr tablet Take 450 mg by mouth daily.  Marland Kitchen CALCIUM-MAGNESIUM-VITAMIN D PO Take 2 tablets by mouth 2 (two) times daily.  . cetirizine (ZYRTEC) 10 MG tablet Take 1 tablet (10 mg total) by mouth daily.  . Coenzyme Q10 (COQ-10 PO) Take 240 mg by mouth daily.   . cycloSPORINE (RESTASIS) 0.05 % ophthalmic emulsion Place 1 drop into both eyes 2 (two) times daily.  . DULoxetine (CYMBALTA) 30 MG capsule Take 90 mg by mouth daily.  . furosemide (LASIX) 40 MG tablet Take 40 mg by mouth 2 (two) times daily.  Marland Kitchen gabapentin (NEURONTIN) 100 MG capsule Take 100 mg by mouth 3 (three) times daily.  Marland Kitchen HYDROcodone-acetaminophen (NORCO/VICODIN) 5-325 MG tablet Take 1-2 tablets by mouth every 6 (six) hours as needed.  Marland Kitchen ipratropium (ATROVENT) 0.03 % nasal spray Place 2 sprays into both nostrils as needed.   Marland Kitchen ketoconazole (NIZORAL) 2 % shampoo Apply 1 application topically daily.  Marland Kitchen LATUDA 20 MG TABS tablet Take 1 tablet by mouth daily with supper.   . levothyroxine (SYNTHROID, LEVOTHROID) 88 MCG tablet Take 88 mcg by mouth daily before breakfast.  . methocarbamol (ROBAXIN) 500 MG tablet Take 1 tablet (500 mg total) by mouth 2 (two) times  daily.  . naproxen (NAPROSYN) 500 MG tablet TAKE 1 TABLET BY MOUTH EVERY 12 HOURS AS NEEDED  . nystatin (MYCOSTATIN/NYSTOP) powder Apply topically 2 (two) times daily.   Marland Kitchen nystatin cream (MYCOSTATIN) Apply 1 application topically daily as needed for dry skin.  Marland Kitchen olopatadine (PATANOL) 0.1 % ophthalmic solution Place 1 drop into both eyes daily as needed for allergies.   Marland Kitchen omeprazole (PRILOSEC) 20 MG capsule Take 20 mg by mouth daily.  . ondansetron (ZOFRAN-ODT) 4 MG disintegrating tablet Take 1 tablet (4 mg  total) by mouth 3 (three) times daily before meals.  Marland Kitchen OVER THE COUNTER MEDICATION Take 1 tablet by mouth daily with supper. [name]  . OVER THE COUNTER MEDICATION Take 1 tablet by mouth. Rejuvinexx  . potassium chloride (K-DUR,KLOR-CON) 10 MEQ tablet Take 1 tablet by mouth daily.  Marland Kitchen rOPINIRole (REQUIP) 2 MG tablet Take 2 mg by mouth 2 (two) times daily.  . traZODone (DESYREL) 100 MG tablet Take 200 mg by mouth at bedtime.   No facility-administered encounter medications on file as of 09/11/2018.      Behavioral Observations:   Appearance: Casually and appropriately dressed and groomed Gait: Ambulated independently with significant stiffness reportedly due to pain Speech: Fluent; very soft voice, mildly slow rate Thought process: Linear, goal directed Affect: Blunted, does smile/laugh occasionally but is typically somewhat flat Interpersonal: Pleasant, appropriate   60 minutes spent face-to-face with patient completing neurobehavioral status exam. 40 minutes spent integrating medical records/clinical data and completing this report. CPT [id number] unit; [id number] unit.   TESTING: There is medical necessity to proceed with neuropsychological assessment as the results will be used to aid in differential diagnosis and clinical decision-making and to inform specific treatment recommendations. Per the patient, her mother and medical records reviewed, there has been a change in cognitive functioning and a reasonable suspicion of neurocognitive disorder (rule out medication effects, rule out due to major depressive disorder/chronic pain).  Clinical Decision Making: In considering the patient's current level of functioning, level of presumed impairment, nature of symptoms, emotional and behavioral responses during the interview, level of literacy, and observed level of motivation, a battery of tests was selected and communicated to the psychometrician.    PLAN: The patient will return on 09/16/2018  to complete the above referenced full battery of neuropsychological testing with a psychometrician under my supervision. Education regarding testing procedures was provided to the patient. Subsequently, the patient will see this provider for a follow-up session at which time her test performances and my impressions and treatment recommendations will be reviewed in detail.  Evaluation ongoing; full report to follow.

## 2018-09-15 ENCOUNTER — Other Ambulatory Visit: Payer: Self-pay | Admitting: *Deleted

## 2018-09-15 DIAGNOSIS — M797 Fibromyalgia: Secondary | ICD-10-CM | POA: Diagnosis not present

## 2018-09-15 DIAGNOSIS — L819 Disorder of pigmentation, unspecified: Secondary | ICD-10-CM | POA: Diagnosis not present

## 2018-09-15 DIAGNOSIS — Z01810 Encounter for preprocedural cardiovascular examination: Secondary | ICD-10-CM | POA: Diagnosis not present

## 2018-09-15 DIAGNOSIS — R600 Localized edema: Secondary | ICD-10-CM | POA: Diagnosis not present

## 2018-09-15 DIAGNOSIS — F419 Anxiety disorder, unspecified: Secondary | ICD-10-CM | POA: Diagnosis not present

## 2018-09-15 DIAGNOSIS — M549 Dorsalgia, unspecified: Secondary | ICD-10-CM | POA: Diagnosis not present

## 2018-09-15 DIAGNOSIS — G43909 Migraine, unspecified, not intractable, without status migrainosus: Secondary | ICD-10-CM | POA: Diagnosis not present

## 2018-09-15 DIAGNOSIS — E039 Hypothyroidism, unspecified: Secondary | ICD-10-CM | POA: Diagnosis not present

## 2018-09-15 DIAGNOSIS — E65 Localized adiposity: Secondary | ICD-10-CM | POA: Diagnosis not present

## 2018-09-15 DIAGNOSIS — K219 Gastro-esophageal reflux disease without esophagitis: Secondary | ICD-10-CM | POA: Diagnosis not present

## 2018-09-15 DIAGNOSIS — F329 Major depressive disorder, single episode, unspecified: Secondary | ICD-10-CM | POA: Diagnosis not present

## 2018-09-15 DIAGNOSIS — M793 Panniculitis, unspecified: Secondary | ICD-10-CM | POA: Diagnosis not present

## 2018-09-15 DIAGNOSIS — G8929 Other chronic pain: Secondary | ICD-10-CM | POA: Diagnosis not present

## 2018-09-15 DIAGNOSIS — G2581 Restless legs syndrome: Secondary | ICD-10-CM | POA: Diagnosis not present

## 2018-09-15 DIAGNOSIS — F4310 Post-traumatic stress disorder, unspecified: Secondary | ICD-10-CM | POA: Diagnosis not present

## 2018-09-15 DIAGNOSIS — Z9884 Bariatric surgery status: Secondary | ICD-10-CM | POA: Diagnosis not present

## 2018-09-15 DIAGNOSIS — D509 Iron deficiency anemia, unspecified: Secondary | ICD-10-CM | POA: Diagnosis not present

## 2018-09-15 NOTE — Patient Outreach (Signed)
[facility]) Care Management  [facility] Social Work  09/15/2018  Paige [name] [birth year] [id number]  Subjective:  "Surgery is planned for the 21st"  Objective: [facility] CSW to assist patient and family with community based resources to aide in their well-being, quality of life and overall safety and needs.     Encounter Medications:  Outpatient Encounter Medications as of 09/15/2018  Medication Sig Note  . buPROPion (WELLBUTRIN SR) 150 MG 12 hr tablet Take 450 mg by mouth daily.   Marland Kitchen CALCIUM-MAGNESIUM-VITAMIN D PO Take 2 tablets by mouth 2 (two) times daily.   . cetirizine (ZYRTEC) 10 MG tablet Take 1 tablet (10 mg total) by mouth daily.   . Coenzyme Q10 (COQ-10 PO) Take 240 mg by mouth daily.    . cycloSPORINE (RESTASIS) 0.05 % ophthalmic emulsion Place 1 drop into both eyes 2 (two) times daily.   . DULoxetine (CYMBALTA) 30 MG capsule Take 90 mg by mouth daily.   . furosemide (LASIX) 40 MG tablet Take 40 mg by mouth 2 (two) times daily.   Marland Kitchen gabapentin (NEURONTIN) 100 MG capsule Take 100 mg by mouth 3 (three) times daily.   Marland Kitchen HYDROcodone-acetaminophen (NORCO/VICODIN) 5-325 MG tablet Take 1-2 tablets by mouth every 6 (six) hours as needed. (Patient not taking: Reported on 09/11/2018)   . ipratropium (ATROVENT) 0.03 % nasal spray Place 2 sprays into both nostrils as needed.    Marland Kitchen ketoconazole (NIZORAL) 2 % shampoo Apply 1 application topically daily.   Marland Kitchen LATUDA 20 MG TABS tablet Take 1 tablet by mouth daily with supper.  05/13/2018: Take every other day  . levothyroxine (SYNTHROID, LEVOTHROID) 88 MCG tablet Take 88 mcg by mouth daily before breakfast.   . methocarbamol (ROBAXIN) 500 MG tablet Take 1 tablet (500 mg total) by mouth 2 (two) times daily.   . naproxen (NAPROSYN) 500 MG tablet TAKE 1 TABLET BY MOUTH EVERY 12 HOURS AS NEEDED   . nystatin (MYCOSTATIN/NYSTOP) powder Apply topically 2 (two) times daily.    Marland Kitchen nystatin cream (MYCOSTATIN) Apply 1 application topically  daily as needed for dry skin.   Marland Kitchen olopatadine (PATANOL) 0.1 % ophthalmic solution Place 1 drop into both eyes daily as needed for allergies.    Marland Kitchen omeprazole (PRILOSEC) 20 MG capsule Take 20 mg by mouth daily.   . ondansetron (ZOFRAN-ODT) 4 MG disintegrating tablet Take 1 tablet (4 mg total) by mouth 3 (three) times daily before meals.   Marland Kitchen OVER THE COUNTER MEDICATION Take 1 tablet by mouth daily with supper. [name]   . OVER THE COUNTER MEDICATION Take 1 tablet by mouth. Rejuvinexx   . potassium chloride (K-DUR,KLOR-CON) 10 MEQ tablet Take 1 tablet by mouth daily.   Marland Kitchen rOPINIRole (REQUIP) 2 MG tablet Take 2 mg by mouth 2 (two) times daily.   Marland Kitchen torsemide (DEMADEX) 100 MG tablet    . traZODone (DESYREL) 100 MG tablet Take 200 mg by mouth at bedtime.    No facility-administered encounter medications on file as of 09/15/2018.     Functional Status:  In your present state of health, do you have any difficulty performing the following activities: 05/22/2018 05/13/2018  Hearing? N N  Vision? Y Y  Difficulty concentrating or making decisions? [name]  Walking or climbing stairs? Y Y  Dressing or bathing? N N  Doing errands, shopping? [name]  Preparing Food and eating ? Y Y  Comment - reports she forgets she is cooking and burns her food.   Using the  Toilet? N N  In the past six months, have you accidently leaked urine? Y Y  Do you have problems with loss of bowel control? N N  Managing your Medications? Y Y  Comment - is now using a pill packing from [name].   Managing your Finances? Y Y  Comment - forgotten to pay bills.  Has problems with compulsive spending.   Housekeeping or managing your Housekeeping? [name]  Some recent data might be hidden    Fall/Depression Screening:  PHQ 2/9 Scores 05/22/2018 05/13/2018 04/30/2018  PHQ - 2 Score '5 5 2  ' PHQ- 9 Score '20 18 13    ' Assessment: CSW spoke with pt by phone who has planned her paniculectomy surgery and is excited and nervous.   She has  been active; getting out with friends and family- stated she "has a blast" doing a tasting with friends at a local bakery.    Plan:  [facility] CM Care Plan Problem One     Most Recent Value  Care Plan Problem One  Patient with depression and anxiety impacting her quality of life.   Role Documenting the Problem One  Clinical Social Worker  Care Plan for Problem One  Active  [facility] Long Term Goal   Patient will report appointments arranged for outpatient mental health Psychiatry and counseling in the next 31 days.   [facility] Long Term Goal Start Date  05/22/18  [facility] Long Term Goal Met Date  07/09/18  Interventions for Problem One Long Term Goal  pt continues to see her therapist  [facility] CM Short Term Goal #1      [facility] CM Short Term Goal #2           Pt requests a follow up call after her surgery and after she gets the results of her cognitive testing. CSW plans f/u call after 10/08/2018.   Eduard Clos, MSW, LCSW Clinical Social Worker  [place], MSW, [place] Worker  Mariano Colon [phone]

## 2018-09-16 ENCOUNTER — Ambulatory Visit: Payer: PPO | Admitting: Psychology

## 2018-09-16 ENCOUNTER — Encounter: Payer: Self-pay | Admitting: Psychology

## 2018-09-16 DIAGNOSIS — M4716 Other spondylosis with myelopathy, lumbar region: Secondary | ICD-10-CM | POA: Diagnosis not present

## 2018-09-16 DIAGNOSIS — M545 Low back pain: Secondary | ICD-10-CM | POA: Diagnosis not present

## 2018-09-16 DIAGNOSIS — M5416 Radiculopathy, lumbar region: Secondary | ICD-10-CM | POA: Diagnosis not present

## 2018-09-16 DIAGNOSIS — R413 Other amnesia: Secondary | ICD-10-CM

## 2018-09-16 NOTE — Progress Notes (Signed)
   Neuropsychology Note  Paige [name] completed 180 minutes of neuropsychological testing with technician, Milana Kidney, BS, under the supervision of Dr. Macarthur Critchley, Licensed Psychologist. The patient did not appear overtly distressed by the testing session, per behavioral observation or via self-report to the technician. Rest breaks were offered.   Clinical Decision Making: In considering the patient's current level of functioning, level of presumed impairment, nature of symptoms, emotional and behavioral responses during the interview, level of literacy, and observed level of motivation/effort, a battery of tests was selected and communicated to the psychometrician.  Communication between the psychologist and technician was ongoing throughout the testing session and changes were made as deemed necessary based on patient performance on testing, technician observations and additional pertinent factors such as those listed above.  Paige [name] will return within approximately 2 weeks for an interactive feedback session with Dr. Si Raider at which time her test performances, clinical impressions and treatment recommendations will be reviewed in detail. The patient understands she can contact our office should she require our assistance before this time.  35 minutes spent performing neuropsychological evaluation services/clinical decision making (psychologist). [CPT 85885] 180 minutes spent face-to-face with patient administering standardized tests, 60 minutes spent scoring (technician). [CPT [id number], 02774]  Full report to follow.

## 2018-09-21 DIAGNOSIS — L821 Other seborrheic keratosis: Secondary | ICD-10-CM | POA: Diagnosis not present

## 2018-09-21 DIAGNOSIS — M793 Panniculitis, unspecified: Secondary | ICD-10-CM | POA: Diagnosis not present

## 2018-09-21 DIAGNOSIS — E65 Localized adiposity: Secondary | ICD-10-CM | POA: Diagnosis not present

## 2018-10-05 DIAGNOSIS — X58XXXA Exposure to other specified factors, initial encounter: Secondary | ICD-10-CM | POA: Diagnosis not present

## 2018-10-05 DIAGNOSIS — K219 Gastro-esophageal reflux disease without esophagitis: Secondary | ICD-10-CM | POA: Diagnosis not present

## 2018-10-05 DIAGNOSIS — Z9884 Bariatric surgery status: Secondary | ICD-10-CM | POA: Diagnosis not present

## 2018-10-05 DIAGNOSIS — R413 Other amnesia: Secondary | ICD-10-CM | POA: Diagnosis not present

## 2018-10-05 DIAGNOSIS — L03311 Cellulitis of abdominal wall: Secondary | ICD-10-CM | POA: Diagnosis not present

## 2018-10-05 DIAGNOSIS — L02211 Cutaneous abscess of abdominal wall: Secondary | ICD-10-CM | POA: Diagnosis not present

## 2018-10-05 DIAGNOSIS — B9689 Other specified bacterial agents as the cause of diseases classified elsewhere: Secondary | ICD-10-CM | POA: Diagnosis not present

## 2018-10-05 DIAGNOSIS — B952 Enterococcus as the cause of diseases classified elsewhere: Secondary | ICD-10-CM | POA: Diagnosis not present

## 2018-10-05 DIAGNOSIS — F419 Anxiety disorder, unspecified: Secondary | ICD-10-CM | POA: Diagnosis not present

## 2018-10-05 DIAGNOSIS — Y999 Unspecified external cause status: Secondary | ICD-10-CM | POA: Diagnosis not present

## 2018-10-05 DIAGNOSIS — M797 Fibromyalgia: Secondary | ICD-10-CM | POA: Diagnosis not present

## 2018-10-05 DIAGNOSIS — J383 Other diseases of vocal cords: Secondary | ICD-10-CM | POA: Diagnosis not present

## 2018-10-05 DIAGNOSIS — F4310 Post-traumatic stress disorder, unspecified: Secondary | ICD-10-CM | POA: Diagnosis not present

## 2018-10-05 DIAGNOSIS — R109 Unspecified abdominal pain: Secondary | ICD-10-CM | POA: Diagnosis not present

## 2018-10-05 DIAGNOSIS — T8143XA Infection following a procedure, organ and space surgical site, initial encounter: Secondary | ICD-10-CM | POA: Diagnosis not present

## 2018-10-05 DIAGNOSIS — E039 Hypothyroidism, unspecified: Secondary | ICD-10-CM | POA: Diagnosis not present

## 2018-10-05 DIAGNOSIS — T8149XA Infection following a procedure, other surgical site, initial encounter: Secondary | ICD-10-CM | POA: Diagnosis not present

## 2018-10-05 DIAGNOSIS — Z8719 Personal history of other diseases of the digestive system: Secondary | ICD-10-CM | POA: Diagnosis not present

## 2018-10-05 DIAGNOSIS — G5603 Carpal tunnel syndrome, bilateral upper limbs: Secondary | ICD-10-CM | POA: Diagnosis not present

## 2018-10-05 DIAGNOSIS — Z951 Presence of aortocoronary bypass graft: Secondary | ICD-10-CM | POA: Diagnosis not present

## 2018-10-06 NOTE — Progress Notes (Signed)
NEUROPSYCHOLOGICAL EVALUATION   Name:    Paige [name]  Date of Birth:   1966/05/29 Date of Interview:  09/11/2018 Date of Testing:  09/16/2018    Date of Feedback:  10/08/2018       Background Information:  Reason for Referral:  Paige [name] is a 52 y.o. female referred by Dr. Metta Clines of [facility] to assess her current level of cognitive functioning and assist in differential diagnosis. The current evaluation consisted of a review of available medical records, an interview with the patient and her mother, and the completion of a neuropsychological testing battery. Informed consent was obtained.  History of Presenting Problem:  Paige [name] is followed by Dr. Tomi Likens for migraines. When she saw Dr. Tomi Likens on 04/07/2018 she reported concerns about declining memory. MMSE was 29/30. It was noted she had see a speech pathologist who told her she had memory deficits (she states she was seeing a speech therapist for difficulty projecting her voice). The patient also has fibromyalgia, chronic pain, and depression. She had a brain MRI on 04/17/2018 which revealed no acute intracranial process, stable right posterior insula susceptibility artifact, possible developmental venous anomaly or old hemorrhage/injury, otherwise unremarkable.  The patient reports first experiencing memory loss when she was undergoing ECT treatments for depression in the 1990s. She had 18 ECT treatments total. Her mother also observed memory loss at that time. However, it has worsened more recently and is concerning to both of them. The patient reports daily forgetfulness and distractibility. She notes she sometimes gets confused about where she is when she is driving. She lives alone but her mother provides significant support. The patient is disabled and has not worked in several years.  She has a significant history of chronic depression which she reports started after a traumatic experience in college.  She has been tried on multiple antidepressants, has worked with numerous therapists/counselors (although she does not think she has ever done trauma focused work in therapy), has had several inpatient hospitalizations for suicidal ideation and attempt, and as noted previously has undergone ECT. Last inpatient psychiatric hospitalization was approximately 8 years ago per her mother. Her mother reports the patient's depression has been chronic and treatment-resistant. When the patient's depression has been severe, psychotic features have been present. There is family history of depression. No other known family history of severe mental illness.   There is family history of dementia in the patient's maternal aunt and maternal uncle (both in their 5s presently, with symptoms beginning in their 49s or 40s).   Upon direct questioning, the patient/caregiver reported the following with regard to current cognitive functioning:   Forgetting recent conversations/events: Yes Repeating statements/questions: Yes Misplacing/losing items: Yes Forgetting appointments or other obligations: Yes Forgetting to take medications: Still misses occasionally, but is doing better now that pharmacy pill-packs  Difficulty concentrating: Yes Starting but not finishing tasks/Distractibility: Yes, this is a major issue Processing information more slowly: Yes  Word-finding difficulty: Yes Comprehension difficulty: Yes occasionally Reading comprehension: She cannot concentrate well enough to read, so she doesn't read anymore, she used to love reading  Getting lost when driving: She called her mother on one occasion when she got lost. Most of the time GPS helps. Uncertain about directions when driving or passenger: Occasionally   As noted previously, the patient lives alone. She independently manages her driving, cooking and medications. Her mother assists with managing finances and appointments.  The patient  complains of horrible back pain, current pain  level today is 6/10. She was in the ED twice last month for back pain. She denies taking any pain medication, stating "it doesn't help".  She has no known history of head injury.  She has difficulty with sleep maintenance. Medication helps her fall asleep but she wakes up after 3 hours and usually cannot return to sleep. She sometimes is able to nap.  The patient reports her mood "depends on the minute". She notes that she had a pretty good day yesterday, she was with a couple of friends "which doesn't happen very often". She mostly spends time with her mother. They are very close. Her mother states that the patient is "extremely dependent on me and I probably foster more of that than I really want to.Marland KitchenMarland KitchenI feel like sometimes I try to encourage her to be more independent but then she will feel like I'm pushing her away and I don't want her to feel that way." They have never done family therapy together. The patient is currently seeing a therapist once a month. She is not sure if it is helpful. She goes to [facility] for psychiatry/medication management. She denies suicidal ideation or intention currently or in the past month. She denies current psychosis.   She denies history of substance abuse or dependence. When asked about any history of addiction, she states "only to food".   Social History: Born/Raised: [place] Her mother reports she excelled in school and did not demonstrate any behavioral or mood difficulties throughout childhood or adolescence.  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 Anemia   . Anxiety disorder   . Chronic fatigue   . Chronic  headaches   . Chronic pain   . Depression   . Family history of colon cancer    mother,uncles,aunts  . Fibromyalgia   . Gallstones   . GERD (gastroesophageal reflux disease)   . Hypothyroidism   . IBS (irritable bowel syndrome)   . IBS (irritable bowel syndrome)   . Obesity   . Obesity   . Personal history of colonic polyps 04/2006   polypoid  . PONV (postoperative nausea and vomiting)     Current medications:  Outpatient Encounter Medications as of 10/08/2018  Medication Sig  . buPROPion (WELLBUTRIN SR) 150 MG 12 hr tablet Take 450 mg by mouth daily.  Marland Kitchen CALCIUM-MAGNESIUM-VITAMIN D PO Take 2 tablets by mouth 2 (two) times daily.  . cetirizine (ZYRTEC) 10 MG tablet Take 1 tablet (10 mg total) by mouth daily.  . Coenzyme Q10 (COQ-10 PO) Take 240 mg by mouth daily.   . cycloSPORINE (RESTASIS) 0.05 % ophthalmic emulsion Place 1 drop into both eyes 2 (two) times daily.  . DULoxetine (CYMBALTA) 30 MG capsule Take 90 mg by mouth daily.  . furosemide (LASIX) 40 MG tablet Take 40 mg by mouth 2 (two) times daily.  Marland Kitchen gabapentin (NEURONTIN) 100 MG capsule Take 100 mg by mouth 3 (three) times daily.  Marland Kitchen HYDROcodone-acetaminophen (NORCO/VICODIN) 5-325 MG tablet Take 1-2 tablets by mouth every 6 (six) hours as needed. (Patient not taking: Reported on 09/11/2018)  . ipratropium (ATROVENT) 0.03 % nasal spray Place 2 sprays into both nostrils as needed.   Marland Kitchen ketoconazole (NIZORAL) 2 % shampoo Apply 1 application topically  daily.  Marland Kitchen LATUDA 20 MG TABS tablet Take 1 tablet by mouth daily with supper.   . levothyroxine (SYNTHROID, LEVOTHROID) 88 MCG tablet Take 88 mcg by mouth daily before breakfast.  . methocarbamol (ROBAXIN) 500 MG tablet Take 1 tablet (500 mg total) by mouth 2 (two) times daily.  . naproxen (NAPROSYN) 500 MG tablet TAKE 1 TABLET BY MOUTH EVERY 12 HOURS AS NEEDED  . nystatin (MYCOSTATIN/NYSTOP) powder Apply topically 2 (two) times daily.   Marland Kitchen nystatin cream (MYCOSTATIN) Apply 1  application topically daily as needed for dry skin.  Marland Kitchen olopatadine (PATANOL) 0.1 % ophthalmic solution Place 1 drop into both eyes daily as needed for allergies.   Marland Kitchen omeprazole (PRILOSEC) 20 MG capsule Take 20 mg by mouth daily.  . ondansetron (ZOFRAN-ODT) 4 MG disintegrating tablet Take 1 tablet (4 mg total) by mouth 3 (three) times daily before meals.  Marland Kitchen OVER THE COUNTER MEDICATION Take 1 tablet by mouth daily with supper. [name]  . OVER THE COUNTER MEDICATION Take 1 tablet by mouth. Rejuvinexx  . potassium chloride (K-DUR,KLOR-CON) 10 MEQ tablet Take 1 tablet by mouth daily.  Marland Kitchen rOPINIRole (REQUIP) 2 MG tablet Take 2 mg by mouth 2 (two) times daily.  Marland Kitchen torsemide (DEMADEX) 100 MG tablet   . traZODone (DESYREL) 100 MG tablet Take 200 mg by mouth at bedtime.   No facility-administered encounter medications on file as of 10/08/2018.      Current Examination:  Behavioral Observations:  Appearance: Casually and appropriately dressed and groomed Gait: Ambulated independently with significant stiffness reportedly due to pain Speech: Fluent; very soft voice, mildly slow rate Thought process: Linear, goal directed Affect: Blunted, does smile/laugh occasionally but is typically somewhat flat Interpersonal: Pleasant, appropriate Orientation: Oriented to person, place and most aspects of time (one day off on the current date). Accurately named the current President and his predecessor.   Tests Administered: . Test of Premorbid Functioning (TOPF) . Wechsler Adult Intelligence Scale-Fourth Edition (WAIS-IV): Similarities, Block Design, Matrix Reasoning, Arithmetic, Symbol Search, Coding and Digit Span subtests . Wechsler Memory Scale-Fourth Edition (WMS-IV) Adult Version (ages 18-69): Logical Memory I, II and Recognition subtests  . [employer] Verbal Learning Test - 2nd Edition (CVLT-2) Short Form . [employer] (WCST) . Repeatable Battery for the Assessment of Neuropsychological  Status (RBANS) Form A:  Figure Copy and Recall Subtests . Neuropsychological Assessment Battery (NAB) Language Module, Form 1:  Naming Subtest . Controlled Oral Word Association Test (COWAT) . Trail Making Test A and B . [place] Diagnostic Aphasia Examination: Complex Ideational Material . Olevia Bowens Depression Inventory - Second edition (BDI-II) . Personality Assessment Inventory (PAI) . Green's WMT  Test Results: Note: Standardized scores are presented only for use by appropriately trained professionals and to allow for any future test-retest comparison. These scores should not be interpreted without consideration of all the information that is contained in the rest of the report. The most recent standardization samples from the test publisher or other sources were used whenever possible to derive standard scores; scores were corrected for age, gender, ethnicity and education when available.   Test Scores:  Test Name Raw Score Standardized Score Descriptor  TOPF 45/70 SS= 102 Average  WAIS-IV Subtests     Similarities 20/36 ss= 7 Low average  Block Design 37/66 ss= 9 Average  Matrix Reasoning 17/26 ss= 10 Average  Arithmetic 8/22 ss= 5 Borderline  Symbol Search 25/60 ss= 8 Low end of average  Coding 50/135 ss= 7 Low average  Digit  Span 25/48 ss= 9 Average  WAIS-IV Index Scores     Working Memory  SS=83 Low average  Processing Speed  SS= 86 Low average  WMS-IV Subtests     LM I 16/50 ss= 6 Low average  LM II 11/50 ss= 6 Low average  LM II Recognition 21/30 Cum %: 10-16 Below expectation  CVLT-II Scores     Trial 1 6/9 Z= -0.5 Average  Trial 4 9/9 Z= 1 High average  Trials 1-4 total 29/36 T= 51 Average  SD Free Recall 7/9 Z= -0.5 Average   LD Free Recall 8/9 Z= 0.5 Average  LD Cued Recall 8/9 Z= 0.5 Average  Recognition Discriminability 9/9 hits 1 false positive Z= 0 Average  Forced Choice Recognition 9/9  WNL  WCST     Total Errors 18 T= 44 Average  Perseverative Responses 9 T=  46 Average  Perseverative Errors 9 T= 41 Low average  Conceptual Level Responses 40 T= 40 Low average  Categories Completed 3 >16% WNL  Trials to Complete 1st Category 11 >16% WNL  Failure to Maintain Set 1    RBANS Subtest     Figure Copy 19/20 Z= 0.6 Average  Figure Recall 10/20 Z= -1.1 Low average  NAB Naming Subtest 31/31 T= 53 Average  COWAT-FAS 28 T= 35 Borderline  COWAT-Animals 13 T= 34 Borderline  BDAE Complex Ideational Material 10/12  Below expectation  Trail Making Test A  41" 0 errors T= 44 Average  Trail Making Test B  127" 1 error T= 29 Impaired  BDI-II 42/63  Severe  PAI (Only elevated clinical scales are shown here)     NIM  T= 73   SOM  T= 94   ANX  T= 71   ARD  T= 73   DEP  T= 104   SCZ  T= 82   BOR  T= 80   SUI  T= 80      Description of Test Results:  Embedded performance validity indicators were within normal limits. She also performed within normal limits on a stand alone test of memory malingering. As such, the patient's current performance on neurocognitive testing is judged to be a relatively accurate representation of her current level of neurocognitive functioning.   Premorbid verbal intellectual abilities were estimated to have been within the average range based on a test of word reading.   Psychomotor processing speed was low average overall.   Auditory attention and working memory ranged from borderline to average.   Visual-spatial construction was average.   Language abilities were variable. Specifically, confrontation naming was average, and semantic verbal fluency was borderline (13 animals/1 minute). Auditory comprehension of complex ideational material was below expectation.   With regard to verbal memory, encoding and acquisition of non-contextual information (i.e., word list) was average. After a brief distracter task, free recall was average (7/9 items). After a delay, free recall was average (8/9 items). Cued recall was average  (8/9 items). Performance on a yes/no recognition task was average. On another verbal memory test, encoding and acquisition of contextual auditory information (i.e., short stories) was low average and below expectation. After a delay, free recall was low average and below expectation. Performance on a yes/no recognition task was below expectation. With regard to non-verbal memory, delayed free recall of visual information was low average.   Executive functioning was variable. Mental flexibility and set-shifting were impaired on Trails B. Verbal fluency with phonemic search restrictions was borderline. Verbal abstract reasoning was low average.  Non-verbal abstract reasoning was average. Deductive reasoning was low average.   On a self-report measure of mood, the patient's responses were indicative of clinically significant depression in the severe range. Symptoms endorsed included: moderate to severe sadness, pessimism, feelings of failure, anhedonia, self-dislike, self-criticalness, inability to cry despite feeling like crying, restlessness/agitation, loss of interest, worthlessness, loss of energy, reduced sleep, irritability, increased appetite, fatigue, concentration difficulty, and loss of libido. She also endorsed mild guilty feelings and indecisiveness. She denied suicidal ideation or intention.   The patient was also administered a more extensive screening of psychopathology and personality disorders (PAI).  Symptom validity indicators on the PAI suggested that the patient endorsed items that present an unfavorable impression.  This result raises the possibility of a mild exaggeration of complaints and problems.  Elevations in this range are often indicative of a "cry for help", or of a markedly negative evaluation of oneself and one's life.  Although this pattern does not necessarily indicate a level of distortion that would render the test results uninterpretable, the interpretive hypotheses derived  from the PAI could overrepresent the extent and degree of significant test findings as a result of this tendency. The PAI clinical profile is marked by significant elevations across several scales, indicating a broad range of clinical features and increasing the possibility of multiple diagnoses.  Given certain response tendencies previously noted, it is possible that the clinical scales may overrepresent or exaggerate the actual degree of psychopathology.  Nonetheless, profile patterns of this type are usually associated with marked distress and, unless there is extensive distortion or exaggeration of symptomatology, severe impairment in functioning is typically present.  The configuration of the clinical scales suggests a person who is reporting significant distress, with particular concerns about her physical functioning.  The patient sees her life as severely disrupted by a variety of physical problems.  These problems have left her unhappy, with little energy or enthusiasm for concentrating on important life tasks and little hope for improvement in the future.  Her performance in important social roles has probably suffered as a result, and her lack of success in these roles serves as an additional source of stress. The patient reports a level of depressive symptomatology that is unusual even in clinical samples.  She is severely depressed, discouraged, and withdrawn, and most likely meets criteria for a major depressive episode.  She is likely to be plagued by thoughts of worthlessness, hopelessness, and personal failure.  She admits openly to feelings of sadness, a loss of interest in normal activities, and a loss of sense of pleasure in things that were previously enjoyed.  She is likely to show a disturbance in sleep pattern, a decrease in level of energy and sexual interest, and a loss of appetite and/or weight.  Psychomotor slowing might also be expected. The patient demonstrates a degree of somatic  concerns that is unusual even in clinical samples.  Such a score suggests a ruminative preoccupation with physical functioning and health matters and severe impairment arising from somatic symptoms.  These somatic complaints are likely to be chronic and accompanied by fatigue and weakness that renders the patient incapable of performing even minimal role expectations.  She is likely to report that her daily functioning has been compromised by numerous and varied physical problems.  She feels that her health is not as good as that of her age peers and likely believes that her health problems are complex and difficult to treat successfully.  Physical complaints are likely to  include symptoms of distress in several biological systems, including the neurological, gastrointestinal, and musculoskeletal systems.  The item endorsement pattern indicates that she reports symptoms consistent with both conversion and somatization disorders.  She is likely to be continuously concerned with her health status and physical problems.  Her social interactions and conversations tend to focus on her health problems, and her self-image may be largely influenced by a belief that she is handicapped by her poor health. A number of aspects of the patient's self-description suggest noteworthy peculiarities in thinking and experience.  She is likely to be a socially isolated individual who has few interpersonal relationships that could be described as close and warm.  She may have limited social skills, with particular difficulty interpreting the normal nuances of interpersonal behavior that provide the meaning to personal relationships.  Her social isolation and detachment may serve to decrease a sense of discomfort that interpersonal contact fosters.  Her thought processes are likely to be marked by confusion, distractibility, and difficulty concentrating, and she may experience her thoughts as being somehow blocked or disrupted.  However,  active psychotic symptoms such as hallucinations or delusions do not appear to be a prominent part of the clinical picture at this time. The patient appears uncertain about major life issues and has little sense of direction or purpose in her life as it currently stands.  This uncertainty likely extends to the arena of interpersonal relationships, as she may have a very unstable sense of what she desires from these interactions.  As a result, it is likely that she has a history of involvement in intense and short-lived relationships and tends to be preoccupied with consistent fears of being abandoned or rejected by those around her. The patient indicates that she is experiencing specific fears or anxiety surrounding some situations.  The pattern of responses reveals that she is likely to display significant symptoms related to traumatic stress.  She has likely experienced a disturbing traumatic event in the past-an event that continues to distress her and produce recurrent episodes of anxiety (this is consistent with history provided by the patient in the clinical interview). The patient indicates that she is experiencing a discomforting level of anxiety and tension.  The major clinical feature appears to be in the cognitive expression of anxiety.  She is likely to be plagued by worry and negative expectations to the degree that her ability to concentrate and attend are significantly compromised.  Associates are likely to comment about her overconcern regarding issues and events over which she has no control.  However, she does not report a strong subjective experience of tension or major difficulties relaxing, nor does she report prominent physical signs of tension and stress, such as sweaty palms, trembling hands, complaints of irregular heartbeats, or shortness of breath. The patient describes herself as tending to closely monitor her environment for evidence that others are trying to harm or discredit her in  some way.  She likely questions and mistrusts the motives of those around her, despite the nature or history of her relationships with them. According to the patient's self-report, she describes NO significant problems in the following areas: antisocial behavior; problems with empathy; unusually elevated mood or heightened activity.  Also, she reports NO significant problems with alcohol or drug abuse or dependence.   With respect to suicidal ideation, the patient reports experiencing recurrent thoughts related to a suicidal act.  Although only a small percentage of individuals who entertain suicidal thoughts actually act upon them, a  score in this range should be considered a significant warning sign of the potential for suicide, regardless of the levels of elevation on other scales.  Furthermore, concerns about her potential for suicide are heightened by the presence of a number of features, such as a lack of social support, social isolation, and hopelessness, that have been found to be associated with suicide risk.  Ongoing follow-up regarding the details of her suicidal thoughts and the potential for suicidal behavior is warranted, as is an evaluation of her life circumstances and available support systems as potential mediating factors.   Clinical Impressions: Cognitive difficulties are likely related to severe depression, high level of pain, and insomnia. Results of cognitive testing revealed deficits in verbal fluency, working memory, and mental flexibility. She also demonstrated mildly below-expectation performances in processing speed, learning and memory for information that is not repeated, and verbal abstract reasoning. Meanwhile, visual-spatial skills, basic attention, nonverbal abstract reasoning, confrontation naming, and memory for information that is repeated was all within normal limits. There is no evidence of cortical dysfunction or early onset Alzheimer's disease. I suspect her cognitive  dysfunction is multifactorial and related to severe depression, pain, and insomnia. Her remote history of ECT is not suspected to be causing any new onset / worsening of deficits at the current time. Unfortunately she is reporting a very high level of depression, anxiety and emotional distress at the present time.   Recommendations/Plan: Based on the findings of the present evaluation, the following recommendations are offered:  1. Mental health treatment: Her depression has apparently been treatment-resistant; however, she reports she has not done any trauma-focused work and her depression developed as a result of trauma. As such, I highly recommend she work with a trauma specialist for individual, weekly therapy. She should continue to see psychiatry for medication management. She may benefit from group therapy and/or a day treatment program to develop additional coping strategies and enhance functioning and independence. Family therapy with her mother may also be considered with a focus on fostering the patient's independence. --The patient accepted a referral to [facility] for trauma focused counseling. 2. The patient should continue to utilize behavioral strategies to help compensate for cognitive difficulties. A list of strategies was provided. 3. The impact of depression, insomnia and chronic pain on cognitive functioning in daily life was described and written information was provided. --A copy of written information and summary of results/recommendations was mailed to the patient per her request.   Feedback to Patient: Paige [name] completed a feedback appointment on 10/08/2018 to review the results of her neuropsychological evaluation with this provider. 15 minutes was spent reviewing her test results, my impressions and my recommendations with the patient as detailed above.    Total time spent on this patient's case: 100 minutes for neurobehavioral status exam with  psychologist (CPT code [id number], [phone] unit); 240 minutes of testing/scoring by psychometrician under psychologist's supervision (CPT codes [phone], [phone] units); 220 minutes for integration of patient data, interpretation of standardized test results and clinical data, clinical decision making, treatment planning and preparation of this report, and interactive feedback with review of results to the patient/family by psychologist (CPT codes [phone], [phone] units).      Thank you for your referral of Paige [name]. Please feel free to contact me if you have any questions or concerns regarding this report.

## 2018-10-08 ENCOUNTER — Encounter: Payer: Self-pay | Admitting: Psychology

## 2018-10-08 ENCOUNTER — Ambulatory Visit (INDEPENDENT_AMBULATORY_CARE_PROVIDER_SITE_OTHER): Payer: PPO | Admitting: Psychology

## 2018-10-08 DIAGNOSIS — R413 Other amnesia: Secondary | ICD-10-CM | POA: Diagnosis not present

## 2018-10-08 DIAGNOSIS — F332 Major depressive disorder, recurrent severe without psychotic features: Secondary | ICD-10-CM

## 2018-10-08 DIAGNOSIS — F4310 Post-traumatic stress disorder, unspecified: Secondary | ICD-10-CM

## 2018-10-08 MED ORDER — ONDANSETRON 4 MG PO TBDP
4.00 | ORAL_TABLET | ORAL | Status: DC
Start: ? — End: 2018-10-08

## 2018-10-08 MED ORDER — GENERIC EXTERNAL MEDICATION
90.00 | Status: DC
Start: 2018-10-09 — End: 2018-10-08

## 2018-10-08 MED ORDER — ACETAMINOPHEN 500 MG PO TABS
1000.00 | ORAL_TABLET | ORAL | Status: DC
Start: 2018-10-08 — End: 2018-10-08

## 2018-10-08 MED ORDER — PROMETHAZINE HCL 12.5 MG PO TABS
12.50 | ORAL_TABLET | ORAL | Status: DC
Start: ? — End: 2018-10-08

## 2018-10-08 MED ORDER — PNEUMOCOCCAL VAC POLYVALENT 25 MCG/0.5ML IJ INJ
0.50 | INJECTION | INTRAMUSCULAR | Status: DC
Start: ? — End: 2018-10-08

## 2018-10-08 MED ORDER — PANTOPRAZOLE SODIUM 40 MG PO TBEC
40.00 | DELAYED_RELEASE_TABLET | ORAL | Status: DC
Start: 2018-10-09 — End: 2018-10-08

## 2018-10-08 MED ORDER — DOCUSATE SODIUM 100 MG PO CAPS
100.00 | ORAL_CAPSULE | ORAL | Status: DC
Start: 2018-10-08 — End: 2018-10-08

## 2018-10-08 MED ORDER — AMOXICILLIN 500 MG PO CAPS
1000.00 | ORAL_CAPSULE | ORAL | Status: DC
Start: 2018-10-08 — End: 2018-10-08

## 2018-10-08 MED ORDER — LEVOTHYROXINE SODIUM 88 MCG PO TABS
88.00 | ORAL_TABLET | ORAL | Status: DC
Start: 2018-10-09 — End: 2018-10-08

## 2018-10-08 MED ORDER — ENOXAPARIN SODIUM 40 MG/0.4ML ~~LOC~~ SOLN
40.00 | SUBCUTANEOUS | Status: DC
Start: 2018-10-09 — End: 2018-10-08

## 2018-10-08 MED ORDER — TRAZODONE HCL 50 MG PO TABS
200.00 | ORAL_TABLET | ORAL | Status: DC
Start: 2018-10-08 — End: 2018-10-08

## 2018-10-08 MED ORDER — GENERIC EXTERNAL MEDICATION
Status: DC
Start: ? — End: 2018-10-08

## 2018-10-08 MED ORDER — OXYCODONE HCL 5 MG PO TABS
5.00 | ORAL_TABLET | ORAL | Status: DC
Start: ? — End: 2018-10-08

## 2018-10-08 MED ORDER — CIPROFLOXACIN HCL 750 MG PO TABS
750.00 | ORAL_TABLET | ORAL | Status: DC
Start: 2018-10-08 — End: 2018-10-08

## 2018-10-08 MED ORDER — PHENOL 1.4 % MT LIQD
1.00 | OROMUCOSAL | Status: DC
Start: ? — End: 2018-10-08

## 2018-10-08 MED ORDER — BUPROPION HCL ER (SR) 100 MG PO TB12
200.00 | ORAL_TABLET | ORAL | Status: DC
Start: 2018-10-08 — End: 2018-10-08

## 2018-10-08 MED ORDER — DIPHENHYDRAMINE HCL 25 MG PO CAPS
25.00 | ORAL_CAPSULE | ORAL | Status: DC
Start: ? — End: 2018-10-08

## 2018-10-08 MED ORDER — GABAPENTIN 400 MG PO CAPS
400.00 | ORAL_CAPSULE | ORAL | Status: DC
Start: 2018-10-08 — End: 2018-10-08

## 2018-10-08 MED ORDER — ROPINIROLE HCL 2 MG PO TABS
2.00 | ORAL_TABLET | ORAL | Status: DC
Start: 2018-10-08 — End: 2018-10-08

## 2018-10-08 MED ORDER — GENERIC EXTERNAL MEDICATION
Status: DC
Start: 2018-10-08 — End: 2018-10-08

## 2018-10-08 MED ORDER — TRAMADOL HCL 50 MG PO TABS
50.00 | ORAL_TABLET | ORAL | Status: DC
Start: ? — End: 2018-10-08

## 2018-10-08 NOTE — Patient Instructions (Signed)
Results of cognitive testing revealed deficits in verbal fluency, working memory, and mental flexibility.   She also demonstrated mildly below-expectation performances in processing speed, learning and memory for information that is not repeated, and verbal abstract reasoning.   Meanwhile, visual-spatial skills, basic attention, nonverbal abstract reasoning, confrontation naming, and memory for information that is repeated was all within normal limits.   There is no evidence of cortical dysfunction or early onset Alzheimer's disease.   I suspect her cognitive dysfunction is multifactorial and related to severe depression, pain, and insomnia.   Her remote history of ECT is not suspected to be causing any new onset / worsening of deficits at the current time.   Unfortunately she is reporting a very high level of depression, anxiety and emotional distress at the present time.   Recommendations/Plan: Based on the findings of the present evaluation, the following recommendations are offered:  1. Mental health treatment: Her depression has apparently been treatment-resistant; however, she reports she has not done any trauma-focused work, and her depression developed as a result of trauma. As such, I highly recommend she work with a trauma specialist for individual, weekly therapy. She was open to this recommendation and accepted a referral to [facility]. She should continue to see psychiatry for medication management.  She may benefit from group therapy and/or a day treatment program to develop additional coping strategies and enhance functioning and independence.  Family therapy with her mother may also be considered with a focus on fostering the patient's independence.  2. The patient should continue to utilize behavioral strategies to help compensate for cognitive difficulties. A list of strategies was provided. (see attached)  3. The impact of depression, insomnia and chronic  pain on cognitive functioning in daily life was described and written information was provided. (see attached)

## 2018-10-09 ENCOUNTER — Other Ambulatory Visit: Payer: Self-pay | Admitting: *Deleted

## 2018-10-09 DIAGNOSIS — F419 Anxiety disorder, unspecified: Secondary | ICD-10-CM | POA: Diagnosis not present

## 2018-10-09 DIAGNOSIS — M19031 Primary osteoarthritis, right wrist: Secondary | ICD-10-CM | POA: Diagnosis not present

## 2018-10-09 DIAGNOSIS — R2689 Other abnormalities of gait and mobility: Secondary | ICD-10-CM | POA: Diagnosis not present

## 2018-10-09 DIAGNOSIS — Z9884 Bariatric surgery status: Secondary | ICD-10-CM | POA: Diagnosis not present

## 2018-10-09 DIAGNOSIS — E65 Localized adiposity: Secondary | ICD-10-CM | POA: Diagnosis not present

## 2018-10-09 DIAGNOSIS — F339 Major depressive disorder, recurrent, unspecified: Secondary | ICD-10-CM | POA: Diagnosis not present

## 2018-10-09 DIAGNOSIS — L03311 Cellulitis of abdominal wall: Secondary | ICD-10-CM | POA: Diagnosis not present

## 2018-10-09 DIAGNOSIS — B9689 Other specified bacterial agents as the cause of diseases classified elsewhere: Secondary | ICD-10-CM | POA: Diagnosis not present

## 2018-10-09 DIAGNOSIS — Z9889 Other specified postprocedural states: Secondary | ICD-10-CM | POA: Diagnosis not present

## 2018-10-09 DIAGNOSIS — T8149XA Infection following a procedure, other surgical site, initial encounter: Secondary | ICD-10-CM | POA: Diagnosis not present

## 2018-10-09 DIAGNOSIS — M19032 Primary osteoarthritis, left wrist: Secondary | ICD-10-CM | POA: Diagnosis not present

## 2018-10-09 DIAGNOSIS — M533 Sacrococcygeal disorders, not elsewhere classified: Secondary | ICD-10-CM | POA: Diagnosis not present

## 2018-10-09 DIAGNOSIS — M5136 Other intervertebral disc degeneration, lumbar region: Secondary | ICD-10-CM | POA: Diagnosis not present

## 2018-10-09 DIAGNOSIS — B952 Enterococcus as the cause of diseases classified elsewhere: Secondary | ICD-10-CM | POA: Diagnosis not present

## 2018-10-09 DIAGNOSIS — D509 Iron deficiency anemia, unspecified: Secondary | ICD-10-CM | POA: Diagnosis not present

## 2018-10-09 DIAGNOSIS — F4310 Post-traumatic stress disorder, unspecified: Secondary | ICD-10-CM | POA: Diagnosis not present

## 2018-10-09 DIAGNOSIS — K219 Gastro-esophageal reflux disease without esophagitis: Secondary | ICD-10-CM | POA: Diagnosis not present

## 2018-10-09 DIAGNOSIS — M797 Fibromyalgia: Secondary | ICD-10-CM | POA: Diagnosis not present

## 2018-10-09 DIAGNOSIS — Z9689 Presence of other specified functional implants: Secondary | ICD-10-CM | POA: Diagnosis not present

## 2018-10-09 NOTE — Patient Outreach (Signed)
[facility]) Care Management  10/09/2018  Paige [name] January 09, 1966 [id number]   CSW spoke with pt by phone who reports she has had her surgery and doing ok. She is recuperating at home with family support. CSW will plan a follow up call in 1-2 weeks.   Eduard Clos, MSW, [place] Worker  [place] [phone]

## 2018-10-13 DIAGNOSIS — K6811 Postprocedural retroperitoneal abscess: Secondary | ICD-10-CM | POA: Diagnosis not present

## 2018-10-14 DIAGNOSIS — L03311 Cellulitis of abdominal wall: Secondary | ICD-10-CM | POA: Diagnosis not present

## 2018-10-14 DIAGNOSIS — B952 Enterococcus as the cause of diseases classified elsewhere: Secondary | ICD-10-CM | POA: Diagnosis not present

## 2018-10-14 DIAGNOSIS — K219 Gastro-esophageal reflux disease without esophagitis: Secondary | ICD-10-CM | POA: Diagnosis not present

## 2018-10-14 DIAGNOSIS — M19032 Primary osteoarthritis, left wrist: Secondary | ICD-10-CM | POA: Diagnosis not present

## 2018-10-14 DIAGNOSIS — M533 Sacrococcygeal disorders, not elsewhere classified: Secondary | ICD-10-CM | POA: Diagnosis not present

## 2018-10-14 DIAGNOSIS — M19031 Primary osteoarthritis, right wrist: Secondary | ICD-10-CM | POA: Diagnosis not present

## 2018-10-14 DIAGNOSIS — T8149XA Infection following a procedure, other surgical site, initial encounter: Secondary | ICD-10-CM | POA: Diagnosis not present

## 2018-10-15 DIAGNOSIS — K219 Gastro-esophageal reflux disease without esophagitis: Secondary | ICD-10-CM | POA: Diagnosis not present

## 2018-10-15 DIAGNOSIS — T8149XA Infection following a procedure, other surgical site, initial encounter: Secondary | ICD-10-CM | POA: Diagnosis not present

## 2018-10-15 DIAGNOSIS — M19032 Primary osteoarthritis, left wrist: Secondary | ICD-10-CM | POA: Diagnosis not present

## 2018-10-15 DIAGNOSIS — M19031 Primary osteoarthritis, right wrist: Secondary | ICD-10-CM | POA: Diagnosis not present

## 2018-10-15 DIAGNOSIS — M533 Sacrococcygeal disorders, not elsewhere classified: Secondary | ICD-10-CM | POA: Diagnosis not present

## 2018-10-15 DIAGNOSIS — L03311 Cellulitis of abdominal wall: Secondary | ICD-10-CM | POA: Diagnosis not present

## 2018-10-15 DIAGNOSIS — B952 Enterococcus as the cause of diseases classified elsewhere: Secondary | ICD-10-CM | POA: Diagnosis not present

## 2018-10-19 ENCOUNTER — Ambulatory Visit (INDEPENDENT_AMBULATORY_CARE_PROVIDER_SITE_OTHER): Payer: PPO | Admitting: Neurology

## 2018-10-19 ENCOUNTER — Encounter: Payer: Self-pay | Admitting: *Deleted

## 2018-10-19 ENCOUNTER — Encounter: Payer: Self-pay | Admitting: Neurology

## 2018-10-19 VITALS — BP 100/64 | HR 71 | Ht 69.0 in | Wt 196.0 lb

## 2018-10-19 DIAGNOSIS — G43709 Chronic migraine without aura, not intractable, without status migrainosus: Secondary | ICD-10-CM | POA: Diagnosis not present

## 2018-10-19 MED ORDER — ZONISAMIDE 50 MG PO CAPS: 50.0000 mg | ORAL_CAPSULE | Freq: Every day | ORAL | 3 refills | Status: DC

## 2018-10-19 NOTE — Patient Instructions (Signed)
1.  Start zonisamide 50mg  daily.  If headaches not improved in 6 weeks, contact me and we can increase dose. 2.  Naproxen for acute headaches.  Limit use of pain relievers to no more than 2 days out of week to prevent risk of rebound or medication-overuse headache. 3.  Please consider contacting [place] 4.  I will see you for next Botox

## 2018-10-19 NOTE — Progress Notes (Signed)
NEUROLOGY FOLLOW UP OFFICE NOTE  Paige [name] [id number]  HISTORY OF PRESENT ILLNESS: Paige [name] is a 52 year old right-handed woman with depression, hypothyroidism who follows up for migraines and memory deficits.    UPDATE: I Migraine: Intensity:  moderate Duration:  Half a day Frequency:  10-12 days a month (no migraines) over past 3 day Frequency of abortive medication: 2 (sometimes 3) days a week Current NSAIDS: Naproxen Current analgesics: None Current triptans: None Current ergotamine: None Current anti-emetic: Zofran ODT 4 mg Current muscle relaxants: Baclofen 10 mg Current anti-anxiolytic: Lorriane Shire Pam 1 mg Current sleep aide: Trazodone 100 mg Current Antihypertensive medications: None Current Antidepressant medications: Cymbalta 60 mg, Wellbutrin XR 200 mg Current Anticonvulsant medications: Gabapentin 300 mg 3 times daily (for carpal tunnel pain) Current anti-CGRP: None Current Vitamins/Herbal/Supplements: B complex, C, co-Q10 Current Antihistamines/Decongestants: None Other therapy:  Botox  Caffeine: Rarely Alcohol: No Smoker: No Diet: Hydrates Exercise: Not routine Depression: Yes; anxiety: Yes. Other pain: Chronic pain Sleep hygiene: Poor  II Memory Deficits:  B12 level from 04/07/2018 was 560. She underwent neuropsychological testing on 09/16/2018 which demonstrated deficits in verbal fluency, working memory, and mental flexibility as well as mildly below expectation performances in processing speed, learning and memory for information that is not repeated, and verbal abstract reasoning.  However, visual-spatial skills, basic attention, nonverbal abstract reasoning, confrontation naming, and memory for information that is repeated were well within normal limits.  There was no evidence of cortical dysfunction or early onset Alzheimer's disease.  Her cognitive difficulties were suspected to be related to her severe depression, high level of pain,  and insomnia.  HISTORY: I  Migraines: Onset:  Since teenager.  She has migraines as well as daily headache which have gotten worse over the past 2 years. Location:  Migraines: bi-frontal/retro-orbital/temples; Daily headache:  Bi-frontal/temporal Quality:  Migraine: pounding; Daily headache: non-throbbing Initial ntensity:  Migraine: 10/10; Daily headache: 6/10 Aura:  no Prodrome:  no Associated symptoms:  Migraine: nausea, photophobia, phonophobia, osmophobia, sometimes vomiting; Daily headache: sometimes photophobia Initial Duration:  Migraine:  Several hours; Daily headache: constant Initial Frequency:  Migraine: every other week; Daily headache:  constant Triggers/exacerbating factors:  Certain scents, bright light, pork Relieving factors:  rest Activity:  Cannot function with migraine  Past NSAIDS:  Ibuprofen, naproxen Past analgesics:  Percocet (hives), morphine (itching) Past abortive triptans:  sumatriptan (difficulty breathing) Past muscle relaxants:  cyclobenzaprine (hallucinations) Past anti-emetic:  phenergan Past antihypertensive medications:  no Past antidepressant medications:  Amitriptyline, possibly venlafaxine Past anticonvulsant medications:  Depakote, topiramate 100mg  twice daily (side effect) Past vitamins/Herbal/Supplements:  no Other past therapy:  acupuncture  Family history of headache:  Paternal aunt  II  Memory Deficits: She has noted short term memory problems since around 2017.  She frequently misplaces items around the house.  She does get distracted and will not get around to chores around the house.  She has been late paying bills on three occasions.  One time, she got disoriented driving on a familiar route.  She sometimes has trouble recalling names of familiar people.  She also has word-finding difficulty and problems with spelling.  She saw a speech pathologist who told her she has memory deficits.  Her thyroid panel was within normal  range. Her uncle and aunt on her mother's side had dementia.  PAST MEDICAL HISTORY: Past Medical History:  Diagnosis Date  . Anemia   . Anxiety disorder   . Chronic fatigue   . Chronic headaches   .  Chronic pain   . Depression   . Family history of colon cancer    mother,uncles,aunts  . Fibromyalgia   . Gallstones   . GERD (gastroesophageal reflux disease)   . Hypothyroidism   . IBS (irritable bowel syndrome)   . IBS (irritable bowel syndrome)   . Obesity   . Obesity   . Personal history of colonic polyps 04/2006   polypoid  . PONV (postoperative nausea and vomiting)     MEDICATIONS: Current Outpatient Medications on File Prior to Visit  Medication Sig Dispense Refill  . buPROPion (WELLBUTRIN SR) 150 MG 12 hr tablet Take 450 mg by mouth daily.    Marland Kitchen CALCIUM-MAGNESIUM-VITAMIN D PO Take 2 tablets by mouth 2 (two) times daily.    . cetirizine (ZYRTEC) 10 MG tablet Take 1 tablet (10 mg total) by mouth daily. 1 tablet 0  . Coenzyme Q10 (COQ-10 PO) Take 240 mg by mouth daily.     . cycloSPORINE (RESTASIS) 0.05 % ophthalmic emulsion Place 1 drop into both eyes 2 (two) times daily.    . DULoxetine (CYMBALTA) 30 MG capsule Take 90 mg by mouth daily.    . furosemide (LASIX) 40 MG tablet Take 40 mg by mouth 2 (two) times daily.    Marland Kitchen gabapentin (NEURONTIN) 100 MG capsule Take 100 mg by mouth 3 (three) times daily.    Marland Kitchen HYDROcodone-acetaminophen (NORCO/VICODIN) 5-325 MG tablet Take 1-2 tablets by mouth every 6 (six) hours as needed. (Patient not taking: Reported on 09/11/2018) 10 tablet 0  . ipratropium (ATROVENT) 0.03 % nasal spray Place 2 sprays into both nostrils as needed.  30 mL 12  . ketoconazole (NIZORAL) 2 % shampoo Apply 1 application topically daily.    Marland Kitchen LATUDA 20 MG TABS tablet Take 1 tablet by mouth daily with supper.   0  . levothyroxine (SYNTHROID, LEVOTHROID) 88 MCG tablet Take 88 mcg by mouth daily before breakfast.    . methocarbamol (ROBAXIN) 500 MG tablet Take 1 tablet  (500 mg total) by mouth 2 (two) times daily. 20 tablet 0  . naproxen (NAPROSYN) 500 MG tablet TAKE 1 TABLET BY MOUTH EVERY 12 HOURS AS NEEDED 16 tablet 3  . nystatin (MYCOSTATIN/NYSTOP) powder Apply topically 2 (two) times daily.     Marland Kitchen nystatin cream (MYCOSTATIN) Apply 1 application topically daily as needed for dry skin.    Marland Kitchen olopatadine (PATANOL) 0.1 % ophthalmic solution Place 1 drop into both eyes daily as needed for allergies.     Marland Kitchen omeprazole (PRILOSEC) 20 MG capsule Take 20 mg by mouth daily.    . ondansetron (ZOFRAN-ODT) 4 MG disintegrating tablet Take 1 tablet (4 mg total) by mouth 3 (three) times daily before meals. 90 tablet 1  . OVER THE COUNTER MEDICATION Take 1 tablet by mouth daily with supper. [name]    . OVER THE COUNTER MEDICATION Take 1 tablet by mouth. Rejuvinexx    . potassium chloride (K-DUR,KLOR-CON) 10 MEQ tablet Take 1 tablet by mouth daily.    Marland Kitchen rOPINIRole (REQUIP) 2 MG tablet Take 2 mg by mouth 2 (two) times daily.    Marland Kitchen torsemide (DEMADEX) 100 MG tablet     . traZODone (DESYREL) 100 MG tablet Take 200 mg by mouth at bedtime.     No current facility-administered medications on file prior to visit.     ALLERGIES: Allergies  Allergen Reactions  . Oxycodone-Acetaminophen Hives  . Topamax [Topiramate] Shortness Of Breath and Other (See Comments)    Really wierd feeling Off  balance; cloudy thinking Really wierd feeling  . Sumatriptan Other (See Comments)    Difficulty breathing  . Flexeril [Cyclobenzaprine Hcl]   . Imitrex [Sumatriptan Base]   . Mobic [Meloxicam]   . Morphine And Related   . Percocet [Oxycodone-Acetaminophen]   . Pork (Porcine) Protein     Other reaction(s): Other (See Comments) Migraines    FAMILY HISTORY: Family History  Problem Relation Age of Onset  . Colon cancer Mother        uncles,aunts  . Diabetes Father        paternal grandmother,sister  . Leukemia Father   . Lymphoma Father   . Skin cancer Father   . Heart disease Father    . Colon polyps Sister        1st degree cousins  . Diabetes Sister   . Stomach cancer Neg Hx    SOCIAL HISTORY: Social History   Socioeconomic History  . Marital status: Single    Spouse name: Not on file  . Number of children: 0  . Years of education: Not on file  . Highest education level: Not on file  Occupational History  . Occupation: RN/disabled  Social Needs  . Financial resource strain: Not on file  . Food insecurity:    Worry: Not on file    Inability: Not on file  . Transportation needs:    Medical: Not on file    Non-medical: Not on file  Tobacco Use  . Smoking status: Never Smoker  . Smokeless tobacco: Never Used  Substance and Sexual Activity  . Alcohol use: Not Currently    Alcohol/week: 0.0 standard drinks    Comment: rarely  . Drug use: No  . Sexual activity: Not on file  Lifestyle  . Physical activity:    Days per week: Not on file    Minutes per session: Not on file  . Stress: Not on file  Relationships  . Social connections:    Talks on phone: Not on file    Gets together: Not on file    Attends religious service: Not on file    Active member of club or organization: Not on file    Attends meetings of clubs or organizations: Not on file    Relationship status: Not on file  . Intimate partner violence:    Fear of current or ex partner: Not on file    Emotionally abused: Not on file    Physically abused: Not on file    Forced sexual activity: Not on file  Other Topics Concern  . Not on file  Social History Narrative  . Not on file    REVIEW OF SYSTEMS: Constitutional: No fevers, chills, or sweats, no generalized fatigue, change in appetite Eyes: No visual changes, double vision, eye pain Ear, nose and throat: No hearing loss, ear pain, nasal congestion, sore throat Cardiovascular: No chest pain, palpitations Respiratory:  No shortness of breath at rest or with exertion, wheezes GastrointestinaI: No nausea, vomiting, diarrhea,  abdominal pain, fecal incontinence Genitourinary:  No dysuria, urinary retention or frequency Musculoskeletal:  pain Integumentary: No rash, pruritus, skin lesions Neurological: as above Psychiatric: depression, insomnia, anxiety Endocrine: No palpitations, fatigue, diaphoresis, mood swings, change in appetite, change in weight, increased thirst Hematologic/Lymphatic:  No purpura, petechiae. Allergic/Immunologic: no itchy/runny eyes, nasal congestion, recent allergic reactions, rashes  PHYSICAL EXAM: Blood pressure 100/64, pulse 71, height 5\' 9"  (1.753 m), weight 196 lb (88.9 kg), SpO2 98 %. General: No acute distress.  Patient appears  well-groomed.  Head:  Normocephalic/atraumatic Eyes:  Fundi examined but not visualized Neck: supple, no paraspinal tenderness, full range of motion Heart:  Regular rate and rhythm Lungs:  Clear to auscultation bilaterally Back: No paraspinal tenderness Neurological Exam: alert and oriented to person, place, and time. Attention span and concentration intact, recent and remote memory intact, fund of knowledge intact.  Speech fluent and not dysarthric, language intact.  CN II-XII intact. Bulk and tone normal, muscle strength 5/5 throughout.  Sensation to light touch, temperature and vibration intact.  Deep tendon reflexes 2+ throughout, toes downgoing.  Finger to nose and heel to shin testing intact.  Gait normal, Romberg negative.  IMPRESSION: 1.  migraine without aura, without status migrainosus, not intractable 2.  Cognitive deficits related to depression, chronic pain and insomnia.  No evidence of a neurodegenerative disease.  PLAN: 1.  For preventative therapy, she will continue Botox.  In attempt to further reduce frequency of headaches, we will start zonisamide 50 mg daily.  If headaches not improved in 6 weeks, we can increase the dose to 100 mg daily. 2.  Naproxen for acute headaches.  Limit use of pain relievers to no more than 2 days out of the week  to prevent risk of rebound or medication overuse headache. 3.  Recommended that she consider contacting [place] for therapy in addition to seeing a psychiatrist. 4.  She will follow up for Botox.Paige Clines, DO  CC: Paige Kiel, MD

## 2018-10-19 NOTE — Progress Notes (Signed)
Called [employer] [phone] spoke with Lovena Le call ref# [id number] no prior auth required and may do buy and bill for [phone] and [phone] respectively.

## 2018-10-21 ENCOUNTER — Ambulatory Visit: Payer: Self-pay | Admitting: *Deleted

## 2018-10-21 DIAGNOSIS — B9689 Other specified bacterial agents as the cause of diseases classified elsewhere: Secondary | ICD-10-CM | POA: Diagnosis not present

## 2018-10-21 DIAGNOSIS — D509 Iron deficiency anemia, unspecified: Secondary | ICD-10-CM | POA: Diagnosis not present

## 2018-10-21 DIAGNOSIS — B952 Enterococcus as the cause of diseases classified elsewhere: Secondary | ICD-10-CM | POA: Diagnosis not present

## 2018-10-21 DIAGNOSIS — L03311 Cellulitis of abdominal wall: Secondary | ICD-10-CM | POA: Diagnosis not present

## 2018-10-21 DIAGNOSIS — T8149XA Infection following a procedure, other surgical site, initial encounter: Secondary | ICD-10-CM | POA: Diagnosis not present

## 2018-10-21 DIAGNOSIS — E65 Localized adiposity: Secondary | ICD-10-CM | POA: Diagnosis not present

## 2018-10-21 DIAGNOSIS — R2689 Other abnormalities of gait and mobility: Secondary | ICD-10-CM | POA: Diagnosis not present

## 2018-10-27 ENCOUNTER — Other Ambulatory Visit: Payer: Self-pay | Admitting: *Deleted

## 2018-10-27 NOTE — Patient Outreach (Signed)
[facility]) Care Management  10/27/2018  Paige [name] [birth year] [id number]     CSW spoke with pt by phone who reports she continues to heal from her surgery. She is looking forward to Thanksgiving with her family and is more optimistic, active and eager to do more. She also agreed to work on a goal of journaling each day; whether it be one word related to something positive that happen that day or lengthier. She shared with CSW that she was pleased to find out the results of her cognitive testing were negative for dementia. "I have some problems with memory they say and it might be related to sleep deprivation and depression". CSW discussed these issues with her and she plans to talk to her PCP about her sleep. She has an appointment scheduled for 11/30/2018 with Dr. Cheryln Manly [facility] Psychologist) and shared that it was recommended that she see a "trauma specialist" by the person who made the appointment.  Pt denies any other concerns or needs at this time and accepts plans for f/u call in 1-2 weeks.      Eduard Clos, MSW, [place] Worker  [place] [phone]

## 2018-10-28 DIAGNOSIS — K6811 Postprocedural retroperitoneal abscess: Secondary | ICD-10-CM | POA: Diagnosis not present

## 2018-11-04 DIAGNOSIS — F332 Major depressive disorder, recurrent severe without psychotic features: Secondary | ICD-10-CM | POA: Diagnosis not present

## 2018-11-06 ENCOUNTER — Other Ambulatory Visit: Payer: Self-pay | Admitting: *Deleted

## 2018-11-06 NOTE — Patient Outreach (Signed)
Paige [name]) Care Management  [facility] Social Work  11/06/2018  CHELCEY CAPUTO 1966/03/24 [id number]  Subjective:  "I had a good Thanksgiving"  Objective: [facility] CSW to assist patient and family with community based resources to aide in their well-being, quality of life and overall safety and needs.    Encounter Medications:  Outpatient Encounter Medications as of 11/06/2018  Medication Sig  . buPROPion (WELLBUTRIN SR) 150 MG 12 hr tablet Take 450 mg by mouth daily.  Marland Kitchen CALCIUM-MAGNESIUM-VITAMIN D PO Take 2 tablets by mouth 2 (two) times daily.  . cetirizine (ZYRTEC) 10 MG tablet Take 1 tablet (10 mg total) by mouth daily.  . Coenzyme Q10 (COQ-10 PO) Take 240 mg by mouth daily.   . DULoxetine (CYMBALTA) 30 MG capsule Take 90 mg by mouth daily.  Marland Kitchen gabapentin (NEURONTIN) 100 MG capsule Take 100 mg by mouth 3 (three) times daily.  Marland Kitchen ipratropium (ATROVENT) 0.03 % nasal spray Place 2 sprays into both nostrils as needed.   Marland Kitchen ketoconazole (NIZORAL) 2 % shampoo Apply 1 application topically daily.  Marland Kitchen levothyroxine (SYNTHROID, LEVOTHROID) 88 MCG tablet Take 88 mcg by mouth daily before breakfast.  . methocarbamol (ROBAXIN) 500 MG tablet Take 1 tablet (500 mg total) by mouth 2 (two) times daily.  . naproxen (NAPROSYN) 500 MG tablet TAKE 1 TABLET BY MOUTH EVERY 12 HOURS AS NEEDED  . nystatin (MYCOSTATIN/NYSTOP) powder Apply topically 2 (two) times daily.   Marland Kitchen nystatin cream (MYCOSTATIN) Apply 1 application topically daily as needed for dry skin.  Marland Kitchen olopatadine (PATANOL) 0.1 % ophthalmic solution Place 1 drop into both eyes daily as needed for allergies.   Marland Kitchen omeprazole (PRILOSEC) 20 MG capsule Take 20 mg by mouth daily.  . ondansetron (ZOFRAN-ODT) 4 MG disintegrating tablet Take 1 tablet (4 mg total) by mouth 3 (three) times daily before meals.  Marland Kitchen OVER THE COUNTER MEDICATION Take 1 tablet by mouth daily with supper. [name]  . OVER THE COUNTER MEDICATION Take 1 tablet by mouth.  Rejuvinexx  . potassium chloride (K-DUR,KLOR-CON) 10 MEQ tablet Take 1 tablet by mouth daily.  Marland Kitchen rOPINIRole (REQUIP) 2 MG tablet Take 2 mg by mouth 2 (two) times daily.  Marland Kitchen torsemide (DEMADEX) 100 MG tablet   . traZODone (DESYREL) 100 MG tablet Take 200 mg by mouth at bedtime.  Marland Kitchen zonisamide (ZONEGRAN) 50 MG capsule Take 1 capsule (50 mg total) by mouth daily.   No facility-administered encounter medications on file as of 11/06/2018.     Functional Status:  In your present state of health, do you have any difficulty performing the following activities: 05/22/2018 05/13/2018  Hearing? N N  Vision? Y Y  Difficulty concentrating or making decisions? [name]  Walking or climbing stairs? Y Y  Dressing or bathing? N N  Doing errands, shopping? [name]  Preparing Food and eating ? Y Y  Comment - reports she forgets she is cooking and burns her food.   Using the Toilet? N N  In the past six months, have you accidently leaked urine? Y Y  Do you have problems with loss of bowel control? N N  Managing your Medications? Y Y  Comment - is now using a pill packing from [employer].   Managing your Finances? Y Y  Comment - forgotten to pay bills.  Has problems with compulsive spending.   Housekeeping or managing your Housekeeping? [name]  Some recent data might be hidden    Fall/Depression Screening:  PHQ 2/9  Scores 05/22/2018 05/13/2018 04/30/2018  PHQ - 2 Score '5 5 2  ' PHQ- 9 Score '20 18 13    ' Assessment:  CSW spoke with pt by phone who was in good spirits. She enjoyed family time over Thanksgiving and plans to see her PCP next Monday.  Pt reports low energy and plans to discuss with her PCP.  CSW discussed the goal set by pt and CSW to find one positive thing about each day and write it on her calendar. She has done this a few days and wants to continue to work on making it a priority.    Plan:  [facility] CM Care Plan Problem One     Most Recent Value  Care Plan Problem One  Patient with depression and  anxiety impacting her quality of life.   Role Documenting the Problem One  Clinical Social Worker  Care Plan for Problem One  Active  [name] Long Term Goal   Patient will report appointments arranged for outpatient mental health Psychiatry and counseling in the next 31 days.   [facility] Long Term Goal Start Date  05/22/18  [name] Long Term Goal Met Date  07/09/18  Interventions for Problem One Long Term Goal  Pt plans to see Dr Cheryln Manly 11/30/2018  [facility] CM Short Term Goal #1   Pt to find one or more positive things about each day and write on calendar each day in the next week.   [facility] CM Short Term Goal #1 Start Date  11/06/18  [facility] CM Short Term Goal #2           CSW will contact pt next week for updates.   Eduard Clos, MSW, [place] Worker  [place] [phone]

## 2018-11-09 DIAGNOSIS — M25561 Pain in right knee: Secondary | ICD-10-CM | POA: Diagnosis not present

## 2018-11-09 DIAGNOSIS — H6981 Other specified disorders of Eustachian tube, right ear: Secondary | ICD-10-CM | POA: Diagnosis not present

## 2018-11-09 DIAGNOSIS — R5382 Chronic fatigue, unspecified: Secondary | ICD-10-CM | POA: Diagnosis not present

## 2018-11-09 DIAGNOSIS — M25511 Pain in right shoulder: Secondary | ICD-10-CM | POA: Diagnosis not present

## 2018-11-09 DIAGNOSIS — M797 Fibromyalgia: Secondary | ICD-10-CM | POA: Diagnosis not present

## 2018-11-09 DIAGNOSIS — M79641 Pain in right hand: Secondary | ICD-10-CM | POA: Diagnosis not present

## 2018-11-09 DIAGNOSIS — M79642 Pain in left hand: Secondary | ICD-10-CM | POA: Diagnosis not present

## 2018-11-09 DIAGNOSIS — E669 Obesity, unspecified: Secondary | ICD-10-CM | POA: Diagnosis not present

## 2018-11-09 DIAGNOSIS — M19011 Primary osteoarthritis, right shoulder: Secondary | ICD-10-CM | POA: Diagnosis not present

## 2018-11-09 DIAGNOSIS — Z6830 Body mass index (BMI) 30.0-30.9, adult: Secondary | ICD-10-CM | POA: Diagnosis not present

## 2018-11-09 DIAGNOSIS — M1711 Unilateral primary osteoarthritis, right knee: Secondary | ICD-10-CM | POA: Diagnosis not present

## 2018-11-13 ENCOUNTER — Ambulatory Visit: Payer: Self-pay | Admitting: *Deleted

## 2018-11-13 ENCOUNTER — Other Ambulatory Visit: Payer: Self-pay | Admitting: *Deleted

## 2018-11-13 NOTE — Patient Outreach (Signed)
[place] [facility]) Care Management  11/13/2018  Paige [name] [birth year] [id number]   CSW attempted to reach pt by phone today without success. No voicemail offered. CSW will attempt to reach pt next week by phone.   Eduard Clos, MSW, [place] Worker  [place] [phone]

## 2018-11-16 ENCOUNTER — Other Ambulatory Visit: Payer: Self-pay | Admitting: *Deleted

## 2018-11-16 NOTE — Patient Outreach (Signed)
[place] [facility]) Care Management  11/16/2018  Paige [name] [birth year] [id number]   CSW spoke with pt by phone who reports doing well. Her spirits are more positive and she has been more active and involved with friends and family.   Pt shared with CSW that she has been shopping, scrap-booking with friends,  She has been writing on her calendar a positive thing that she has experienced each day.     CSW commended pt for her work to find positive experiences each day. CSW plans f/u call next week.   THN CM Care Plan Problem One     Most Recent Value  Care Plan Problem One  Patient with depression and anxiety impacting her quality of life.   Role Documenting the Problem One  Clinical Social Worker  Care Plan for Problem One  Active  [facility] Long Term Goal   Patient will report appointments arranged for outpatient mental health Psychiatry and counseling in the next 31 days.   [facility]N Long Term Goal Start Date  05/22/18  [facility] Long Term Goal Met Date  07/09/18  [facility] CM Short Term Goal #1   Pt to find one or more positive things about each day and write on calendar each day in the next week.   [facility]N CM Short Term Goal #1 Start Date  11/06/18  [facility] CM Short Term Goal #1 Met Date  11/17/18  Interventions for Short Term Goal #1  Pt shared with CSW some of her entries related to positivity. Pt reports helpful task and commits to continue doing this.   [facility]N CM Short Term Goal #2             Eduard Clos, MSW, [place] Worker  [place] [phone]

## 2018-11-19 ENCOUNTER — Ambulatory Visit (INDEPENDENT_AMBULATORY_CARE_PROVIDER_SITE_OTHER): Payer: PPO | Admitting: Psychology

## 2018-11-19 ENCOUNTER — Telehealth: Payer: Self-pay | Admitting: *Deleted

## 2018-11-19 DIAGNOSIS — F411 Generalized anxiety disorder: Secondary | ICD-10-CM | POA: Diagnosis not present

## 2018-11-19 DIAGNOSIS — F331 Major depressive disorder, recurrent, moderate: Secondary | ICD-10-CM | POA: Diagnosis not present

## 2018-11-19 NOTE — Telephone Encounter (Signed)
Called to make sure no changes in insurance. Her insurance is good with no prior authorization and buy and bill.

## 2018-11-23 ENCOUNTER — Ambulatory Visit: Payer: Self-pay | Admitting: *Deleted

## 2018-11-24 ENCOUNTER — Ambulatory Visit: Payer: PPO | Admitting: Psychology

## 2018-11-30 ENCOUNTER — Other Ambulatory Visit: Payer: Self-pay | Admitting: *Deleted

## 2018-11-30 ENCOUNTER — Ambulatory Visit: Payer: PPO | Admitting: Psychology

## 2018-11-30 NOTE — Patient Outreach (Signed)
[place] [facility]) Care Management  11/30/2018  Paige [name] 1966-11-07 [id number]   CSW spoke with pt by phone today. She reports her Christmas and time with family went well; "I got along with mom and even with my sister". She also shared with CSW that she went to dinner and a movie with friends over the holidays and had a good time. She has also cleaned out and organized her bedroom with plans to tackle her scrapbook/office next. CSW encouraged pt to reflect on how positive she feels upon entering her bedroom and the positive energy it provides. She is motivated to keep it clean and organized and continue her room by room project.  Pt did complain of "pain all over" and states she thinks it is likely her fibromyalgia but also is trying to eliminate sugar from her diet.  CSW talked with pt about continuing to try to focus on the positives and to write down daily a positive thing about her day. CSW discussed how projects, interactions, activities and focusing on positive and not negative can ihelp to ncorporate a more positive attitude and may help improve the chronic pain.   Pt appreciative a call and support. CSW will plan a followup call in 7-10 days.    Eduard Clos, MSW, [place] Worker  [place] [phone]

## 2018-12-04 ENCOUNTER — Ambulatory Visit: Payer: PPO | Admitting: Neurology

## 2018-12-04 ENCOUNTER — Telehealth: Payer: Self-pay | Admitting: Neurology

## 2018-12-04 NOTE — Telephone Encounter (Signed)
Patient is calling in again wanting to speak with you.

## 2018-12-04 NOTE — Telephone Encounter (Signed)
Patient called in that she missed her appt and does not want to wait until next botox day for her injections. Is there a work in spot for her? She started crying on the phone. Thanks!

## 2018-12-07 ENCOUNTER — Ambulatory Visit (INDEPENDENT_AMBULATORY_CARE_PROVIDER_SITE_OTHER): Payer: PPO | Admitting: Psychology

## 2018-12-07 DIAGNOSIS — E669 Obesity, unspecified: Secondary | ICD-10-CM | POA: Diagnosis not present

## 2018-12-07 DIAGNOSIS — F331 Major depressive disorder, recurrent, moderate: Secondary | ICD-10-CM | POA: Diagnosis not present

## 2018-12-07 DIAGNOSIS — F411 Generalized anxiety disorder: Secondary | ICD-10-CM | POA: Diagnosis not present

## 2018-12-07 DIAGNOSIS — E039 Hypothyroidism, unspecified: Secondary | ICD-10-CM | POA: Diagnosis not present

## 2018-12-07 DIAGNOSIS — R5382 Chronic fatigue, unspecified: Secondary | ICD-10-CM | POA: Diagnosis not present

## 2018-12-07 DIAGNOSIS — Z79899 Other long term (current) drug therapy: Secondary | ICD-10-CM | POA: Diagnosis not present

## 2018-12-07 DIAGNOSIS — Z6831 Body mass index (BMI) 31.0-31.9, adult: Secondary | ICD-10-CM | POA: Diagnosis not present

## 2018-12-07 DIAGNOSIS — M797 Fibromyalgia: Secondary | ICD-10-CM | POA: Diagnosis not present

## 2018-12-07 NOTE — Telephone Encounter (Signed)
Called and spoke with Pt, scheduled her for 2/28 at 8:30a

## 2018-12-08 DIAGNOSIS — H25013 Cortical age-related cataract, bilateral: Secondary | ICD-10-CM | POA: Diagnosis not present

## 2018-12-08 DIAGNOSIS — H1013 Acute atopic conjunctivitis, bilateral: Secondary | ICD-10-CM | POA: Diagnosis not present

## 2018-12-08 DIAGNOSIS — H2513 Age-related nuclear cataract, bilateral: Secondary | ICD-10-CM | POA: Diagnosis not present

## 2018-12-08 DIAGNOSIS — H04123 Dry eye syndrome of bilateral lacrimal glands: Secondary | ICD-10-CM | POA: Diagnosis not present

## 2018-12-10 ENCOUNTER — Other Ambulatory Visit: Payer: Self-pay | Admitting: *Deleted

## 2018-12-11 NOTE — Patient Outreach (Signed)
[place] [facility]) Care Management  12/11/2018  KASHIA BROSSARD Mar 15, 1966 [id number]   CSW spoke with pt by phone who reports she is doing well- still working on house projects to clean out.  She also shared with CSW that she has been out to the movies with friends and is enjoying being more active. She is continuing to journal positive moments and overall feels her depression is better. "I saw Dr Cheryln Manly and think I'm really gonna like him".  Pt reports Dr Gutterman(Psychologist) plans to do EMDR treatments to help with her PTSD. She is optimistic and encouraged. Pt interested in getting some assistance with home adaptations to allow her to be more independent at home- CSW will request info be mailed to her for Voc Rehab/Independent Living services. CSW praised pt for her positive steps and encouraged her to continue her journal notes, mental health outpatient therapy, and social activities.  Pt requests CSW follow up in 1-2 weeks for updates.     Eduard Clos, MSW, [place] Worker  [place] [phone]

## 2018-12-15 NOTE — Patient Outreach (Signed)
[place] [facility]) Care Management  12/15/2018  KRISHIKA BUGGE 1966-09-04 [id number]  BSW mailed patient resources as requested by CSW Eduard Clos.  Daneen Schick, BSW, CDP [facility] [phone]

## 2018-12-17 ENCOUNTER — Ambulatory Visit (INDEPENDENT_AMBULATORY_CARE_PROVIDER_SITE_OTHER): Payer: PPO | Admitting: Psychology

## 2018-12-17 DIAGNOSIS — F331 Major depressive disorder, recurrent, moderate: Secondary | ICD-10-CM

## 2018-12-17 DIAGNOSIS — F411 Generalized anxiety disorder: Secondary | ICD-10-CM | POA: Diagnosis not present

## 2018-12-19 DIAGNOSIS — M65341 Trigger finger, right ring finger: Secondary | ICD-10-CM | POA: Diagnosis not present

## 2018-12-19 DIAGNOSIS — M79642 Pain in left hand: Secondary | ICD-10-CM | POA: Diagnosis not present

## 2018-12-19 DIAGNOSIS — M6530 Trigger finger, unspecified finger: Secondary | ICD-10-CM | POA: Insufficient documentation

## 2018-12-19 DIAGNOSIS — M79641 Pain in right hand: Secondary | ICD-10-CM | POA: Diagnosis not present

## 2018-12-19 DIAGNOSIS — M654 Radial styloid tenosynovitis [de Quervain]: Secondary | ICD-10-CM | POA: Diagnosis not present

## 2018-12-22 DIAGNOSIS — D649 Anemia, unspecified: Secondary | ICD-10-CM | POA: Diagnosis not present

## 2018-12-23 ENCOUNTER — Other Ambulatory Visit: Payer: Self-pay | Admitting: *Deleted

## 2018-12-23 NOTE — Patient Outreach (Signed)
[place] [facility]) Care Management  12/23/2018  Paige [name] 09-25-1966 [id number]   CSW made contact with pt today by phone. She reports she is doing well- has been participating in [place] and other social activities and is planning to see Dr. Cheryln Manly on Friday. "I start (EMDR) treatments in March".  She also shared with CSW that she is considering pain clinic options still- may go to [place] for evaluation.  She received the Voc Rehab/Independent Living material mailed to her and she plans to call and inquire about home modification assistance.   Pt would like a f/u call from [place] in 2-3 weeks. CSW advised pt plans to call in 2-3 weeks.   Eduard Clos, MSW, [place] Worker  [place] [phone]

## 2018-12-25 ENCOUNTER — Ambulatory Visit (INDEPENDENT_AMBULATORY_CARE_PROVIDER_SITE_OTHER): Payer: PPO | Admitting: Psychology

## 2018-12-25 DIAGNOSIS — F411 Generalized anxiety disorder: Secondary | ICD-10-CM

## 2018-12-25 DIAGNOSIS — F331 Major depressive disorder, recurrent, moderate: Secondary | ICD-10-CM

## 2018-12-28 DIAGNOSIS — R748 Abnormal levels of other serum enzymes: Secondary | ICD-10-CM | POA: Diagnosis not present

## 2018-12-28 DIAGNOSIS — R7989 Other specified abnormal findings of blood chemistry: Secondary | ICD-10-CM | POA: Diagnosis not present

## 2019-01-01 ENCOUNTER — Ambulatory Visit (INDEPENDENT_AMBULATORY_CARE_PROVIDER_SITE_OTHER): Payer: PPO | Admitting: Psychology

## 2019-01-01 DIAGNOSIS — F331 Major depressive disorder, recurrent, moderate: Secondary | ICD-10-CM | POA: Diagnosis not present

## 2019-01-01 DIAGNOSIS — F411 Generalized anxiety disorder: Secondary | ICD-10-CM | POA: Diagnosis not present

## 2019-01-06 DIAGNOSIS — F332 Major depressive disorder, recurrent severe without psychotic features: Secondary | ICD-10-CM | POA: Diagnosis not present

## 2019-01-08 ENCOUNTER — Other Ambulatory Visit: Payer: Self-pay | Admitting: *Deleted

## 2019-01-08 NOTE — Patient Outreach (Signed)
[place] [facility]) Care Management  01/08/2019  Paige [name] 10-22-1966 [id number]   CSW spoke with pt by phone who indicates she is doing well. She has been socially active and enjoying some outings with friends.  "We won a Retail buyer at a local bar".  She is still having pain and still making decisions on her plans for medical care. "I can't afford to go to the Doctor in [place]".  Pt acknowledges that being socially active helps to improve her overall mental state and is encouraged to more as she can.   CSW will plan a f/u call to her 2-3 weeks.    Eduard Clos, MSW, [place] Worker  [place] [phone]

## 2019-01-13 DIAGNOSIS — G4719 Other hypersomnia: Secondary | ICD-10-CM | POA: Diagnosis not present

## 2019-01-13 DIAGNOSIS — F322 Major depressive disorder, single episode, severe without psychotic features: Secondary | ICD-10-CM | POA: Diagnosis not present

## 2019-01-13 DIAGNOSIS — M797 Fibromyalgia: Secondary | ICD-10-CM | POA: Diagnosis not present

## 2019-01-13 DIAGNOSIS — R5382 Chronic fatigue, unspecified: Secondary | ICD-10-CM | POA: Diagnosis not present

## 2019-01-13 DIAGNOSIS — M79641 Pain in right hand: Secondary | ICD-10-CM | POA: Diagnosis not present

## 2019-01-13 DIAGNOSIS — M79642 Pain in left hand: Secondary | ICD-10-CM | POA: Diagnosis not present

## 2019-01-13 DIAGNOSIS — Z6831 Body mass index (BMI) 31.0-31.9, adult: Secondary | ICD-10-CM | POA: Diagnosis not present

## 2019-01-13 DIAGNOSIS — R748 Abnormal levels of other serum enzymes: Secondary | ICD-10-CM | POA: Diagnosis not present

## 2019-01-13 DIAGNOSIS — D509 Iron deficiency anemia, unspecified: Secondary | ICD-10-CM | POA: Diagnosis not present

## 2019-01-15 ENCOUNTER — Ambulatory Visit (INDEPENDENT_AMBULATORY_CARE_PROVIDER_SITE_OTHER): Payer: PPO | Admitting: Psychology

## 2019-01-15 DIAGNOSIS — F331 Major depressive disorder, recurrent, moderate: Secondary | ICD-10-CM

## 2019-01-15 DIAGNOSIS — F411 Generalized anxiety disorder: Secondary | ICD-10-CM | POA: Diagnosis not present

## 2019-01-18 DIAGNOSIS — M25552 Pain in left hip: Secondary | ICD-10-CM | POA: Diagnosis not present

## 2019-01-22 ENCOUNTER — Ambulatory Visit: Payer: PPO | Admitting: Psychology

## 2019-01-25 ENCOUNTER — Other Ambulatory Visit: Payer: Self-pay | Admitting: *Deleted

## 2019-01-25 NOTE — Patient Outreach (Signed)
[place] [facility]) Care Management  01/25/2019  Paige [name] 12-27-1965 [id number]    CSW spoke with pt by phone today. She reports having a lot of pain. She will see Dr Amedeo Plenty this Friday for her hand pain, her Psychologist on Wednesday and also anticipates a possible referral to [facility]  Her pain has caused her to not be as active and this impacts her overall mental state as well.   CSW discussed goal setting and requests a follow up call next week after her appointment with Pscyhologist.CSW encouraged her to be thinking of goal(s) that I can assist her with.    CSW will plan a f/u call next week.  Eduard Clos, MSW, [place] Worker  [place] [phone]

## 2019-01-27 ENCOUNTER — Ambulatory Visit (INDEPENDENT_AMBULATORY_CARE_PROVIDER_SITE_OTHER): Payer: PPO | Admitting: Psychology

## 2019-01-27 DIAGNOSIS — F411 Generalized anxiety disorder: Secondary | ICD-10-CM

## 2019-01-27 DIAGNOSIS — F331 Major depressive disorder, recurrent, moderate: Secondary | ICD-10-CM

## 2019-01-29 ENCOUNTER — Ambulatory Visit (INDEPENDENT_AMBULATORY_CARE_PROVIDER_SITE_OTHER): Payer: PPO | Admitting: Neurology

## 2019-01-29 ENCOUNTER — Ambulatory Visit: Payer: PPO | Admitting: Psychology

## 2019-01-29 DIAGNOSIS — G43709 Chronic migraine without aura, not intractable, without status migrainosus: Secondary | ICD-10-CM

## 2019-01-29 DIAGNOSIS — M654 Radial styloid tenosynovitis [de Quervain]: Secondary | ICD-10-CM | POA: Diagnosis not present

## 2019-01-29 DIAGNOSIS — M65341 Trigger finger, right ring finger: Secondary | ICD-10-CM | POA: Diagnosis not present

## 2019-01-29 MED ORDER — ONABOTULINUMTOXINA 100 UNITS IJ SOLR
155.0000 [IU] | Freq: Once | INTRAMUSCULAR | Status: AC
  Administered 2019-01-29: 155 [IU] via INTRAMUSCULAR

## 2019-01-29 NOTE — Progress Notes (Signed)
Botulinum Clinic   Procedure Note Botox  Attending: Dr. Tomi Likens  Preoperative Diagnosis(es): Chronic migraine  Consent obtained from: Patient Benefits discussed included, but were not limited to decreased muscle tightness, increased joint range of motion, and decreased pain.  Risk discussed included, but were not limited pain and discomfort, bleeding, bruising, excessive weakness, venous thrombosis, muscle atrophy and dysphagia.  Anticipated outcomes of the procedure as well as he risks and benefits of the alternatives to the procedure, and the roles and tasks of the personnel to be involved, were discussed with the patient, and the patient consents to the procedure and agrees to proceed. A copy of the patient medication guide was given to the patient which explains the blackbox warning.  Patients identity and treatment sites confirmed: yes.  Details of Procedure: Skin was cleaned with alcohol. Prior to injection, the needle plunger was aspirated to make sure the needle was not within a blood vessel.  There was no blood retrieved on aspiration.    Following is a summary of the muscles injected  And the amount of Botulinum toxin used:  Dilution 200 units of Botox was reconstituted with 4 ml of preservative free normal saline. Time of reconstitution: At the time of the office visit (<30 minutes prior to injection)   Injections  155 total units of Botox was injected with a 30 gauge needle.  Injection Sites: L occipitalis: 15 units- 3 sites  R occiptalis: 15 units- 3 sites  L upper trapezius: 15 units- 3 sites R upper trapezius: 15 units- 3 sits          L paraspinal: 10 units- 2 sites R paraspinal: 10 units- 2 sites  Face L frontalis(2 injection sites):10 units   R frontalis(2 injection sites):10 units         L corrugator: 5 units   R corrugator: 5 units           Procerus: 5 units   L temporalis: 20 units R temporalis: 20 units   Agent:  200 units of botulinum Type A  (Onobotulinum Toxin type A) was reconstituted with 4 ml of preservative free normal saline.  Time of reconstitution: At the time of the office visit (<30 minutes prior to injection)     Total injected (Units): 155  Total wasted (Units): No waste  Patient tolerated procedure well without complications.   Reinjection is anticipated in 3 months.

## 2019-02-01 ENCOUNTER — Other Ambulatory Visit: Payer: Self-pay | Admitting: *Deleted

## 2019-02-01 DIAGNOSIS — R200 Anesthesia of skin: Secondary | ICD-10-CM | POA: Diagnosis not present

## 2019-02-01 DIAGNOSIS — Z1339 Encounter for screening examination for other mental health and behavioral disorders: Secondary | ICD-10-CM | POA: Diagnosis not present

## 2019-02-01 DIAGNOSIS — Z6832 Body mass index (BMI) 32.0-32.9, adult: Secondary | ICD-10-CM | POA: Diagnosis not present

## 2019-02-01 DIAGNOSIS — Z0001 Encounter for general adult medical examination with abnormal findings: Secondary | ICD-10-CM | POA: Diagnosis not present

## 2019-02-01 DIAGNOSIS — Z1231 Encounter for screening mammogram for malignant neoplasm of breast: Secondary | ICD-10-CM | POA: Diagnosis not present

## 2019-02-01 DIAGNOSIS — D509 Iron deficiency anemia, unspecified: Secondary | ICD-10-CM | POA: Diagnosis not present

## 2019-02-01 DIAGNOSIS — Z1331 Encounter for screening for depression: Secondary | ICD-10-CM | POA: Diagnosis not present

## 2019-02-01 DIAGNOSIS — M79642 Pain in left hand: Secondary | ICD-10-CM | POA: Diagnosis not present

## 2019-02-01 DIAGNOSIS — G4710 Hypersomnia, unspecified: Secondary | ICD-10-CM | POA: Diagnosis not present

## 2019-02-01 NOTE — Patient Outreach (Signed)
[place] [facility]) Care Management   02/01/2019  FATOU DUNNIGAN Aug 03, 1966 [id number]   CSW spoke with pt today by phone who reports she is doing ok. "I am going for my EMDR this week", which she is hopeful will help.Pt also shared with CSW that she had a shot in her hand and hopeful this will help her pain. She is planning an outing this week with friends and looking forward to socializing. Pt wants to think on goal options and plan a f/u call next week after her EMDR planned for this week,   CSW will plan a followup outreach call next week.   Eduard Clos, MSW, [place] Worker  [place] [phone]

## 2019-02-04 ENCOUNTER — Ambulatory Visit: Payer: PPO | Admitting: Psychology

## 2019-02-04 DIAGNOSIS — F4310 Post-traumatic stress disorder, unspecified: Secondary | ICD-10-CM | POA: Diagnosis not present

## 2019-02-05 ENCOUNTER — Ambulatory Visit: Payer: PPO | Admitting: Psychology

## 2019-02-05 DIAGNOSIS — E65 Localized adiposity: Secondary | ICD-10-CM | POA: Diagnosis not present

## 2019-02-09 ENCOUNTER — Other Ambulatory Visit: Payer: Self-pay | Admitting: *Deleted

## 2019-02-09 NOTE — Patient Outreach (Signed)
[place] [facility]) Care Management  02/09/2019  Paige [name] 1966/08/10 [id number]   CSW spoke with pt who was in good spirits and feeling better. She wants to work on "moving more" and her diet/weight.  CSW discussed ways she has been able to lose weight before as well as how moving/exercising will also aide in her weight loss by burning calories.   She is interested in setting some goals to pursue more activity and weight  Loss. CSW will plan to check in next week for progress.   Eduard Clos, MSW, [place] Worker  [place] [phone]

## 2019-02-10 ENCOUNTER — Ambulatory Visit (INDEPENDENT_AMBULATORY_CARE_PROVIDER_SITE_OTHER): Payer: PPO | Admitting: Psychology

## 2019-02-10 ENCOUNTER — Other Ambulatory Visit: Payer: Self-pay

## 2019-02-10 DIAGNOSIS — F4310 Post-traumatic stress disorder, unspecified: Secondary | ICD-10-CM

## 2019-02-12 ENCOUNTER — Ambulatory Visit: Payer: PPO | Admitting: Psychology

## 2019-02-16 ENCOUNTER — Ambulatory Visit: Payer: PPO | Admitting: Psychology

## 2019-02-16 ENCOUNTER — Ambulatory Visit: Payer: Self-pay | Admitting: *Deleted

## 2019-02-17 DIAGNOSIS — F332 Major depressive disorder, recurrent severe without psychotic features: Secondary | ICD-10-CM | POA: Diagnosis not present

## 2019-02-18 ENCOUNTER — Other Ambulatory Visit: Payer: Self-pay

## 2019-02-18 ENCOUNTER — Ambulatory Visit (INDEPENDENT_AMBULATORY_CARE_PROVIDER_SITE_OTHER): Payer: PPO | Admitting: Psychology

## 2019-02-18 DIAGNOSIS — F4310 Post-traumatic stress disorder, unspecified: Secondary | ICD-10-CM | POA: Diagnosis not present

## 2019-02-18 DIAGNOSIS — D509 Iron deficiency anemia, unspecified: Secondary | ICD-10-CM | POA: Diagnosis not present

## 2019-02-19 ENCOUNTER — Other Ambulatory Visit: Payer: Self-pay | Admitting: *Deleted

## 2019-02-19 ENCOUNTER — Ambulatory Visit: Payer: PPO | Admitting: Psychology

## 2019-02-19 NOTE — Patient Outreach (Signed)
[place] [facility]) Care Management  02/19/2019  Paige [name] [birth year] [id number]   CSW spoke with pt today by phone. She is in good spirits; has been "scrapbooking" and staying home due to the [place].  She is encouraged to find ways to keep her self busy and away from germs. She shared some plans and thoughts and was encouraged to also work on the goals we made for exercise, weight loss, etc.   CSW will touch base with pt in the next 7-10 days.   Eduard Clos, MSW, [place] Worker  [place] [phone]

## 2019-02-22 ENCOUNTER — Ambulatory Visit (INDEPENDENT_AMBULATORY_CARE_PROVIDER_SITE_OTHER): Payer: PPO | Admitting: Psychology

## 2019-02-22 DIAGNOSIS — F4310 Post-traumatic stress disorder, unspecified: Secondary | ICD-10-CM

## 2019-02-26 ENCOUNTER — Ambulatory Visit: Payer: PPO | Admitting: Psychology

## 2019-02-26 ENCOUNTER — Other Ambulatory Visit: Payer: Self-pay | Admitting: *Deleted

## 2019-02-27 NOTE — Patient Outreach (Signed)
[place] [facility]) Care Management  02/27/2019  Paige [name] 1966/06/13 [id number]  CSW spoke with pt by phone on 02/26/2019. She is staying isolated and busy and in good spirits.  CSW encouraged pt to find ways to stay social, active and engaged while COVID restrictions are in place. CSW offered support and will follow up in 7-10 days.   Eduard Clos, MSW, LCSW Clinical Social Worker  [place], MSW, [place] Worker  [place] [phone]

## 2019-03-04 DIAGNOSIS — Z5189 Encounter for other specified aftercare: Secondary | ICD-10-CM | POA: Diagnosis not present

## 2019-03-04 DIAGNOSIS — E039 Hypothyroidism, unspecified: Secondary | ICD-10-CM | POA: Diagnosis not present

## 2019-03-04 DIAGNOSIS — C50212 Malignant neoplasm of upper-inner quadrant of left female breast: Secondary | ICD-10-CM | POA: Diagnosis not present

## 2019-03-04 DIAGNOSIS — Z5111 Encounter for antineoplastic chemotherapy: Secondary | ICD-10-CM | POA: Diagnosis not present

## 2019-03-04 DIAGNOSIS — Z87898 Personal history of other specified conditions: Secondary | ICD-10-CM | POA: Diagnosis not present

## 2019-03-04 DIAGNOSIS — R42 Dizziness and giddiness: Secondary | ICD-10-CM | POA: Diagnosis not present

## 2019-03-04 DIAGNOSIS — N959 Unspecified menopausal and perimenopausal disorder: Secondary | ICD-10-CM | POA: Diagnosis not present

## 2019-03-04 DIAGNOSIS — Z8673 Personal history of transient ischemic attack (TIA), and cerebral infarction without residual deficits: Secondary | ICD-10-CM | POA: Diagnosis not present

## 2019-03-04 DIAGNOSIS — Z9689 Presence of other specified functional implants: Secondary | ICD-10-CM | POA: Diagnosis not present

## 2019-03-04 DIAGNOSIS — Z79899 Other long term (current) drug therapy: Secondary | ICD-10-CM | POA: Diagnosis not present

## 2019-03-04 DIAGNOSIS — I10 Essential (primary) hypertension: Secondary | ICD-10-CM | POA: Diagnosis not present

## 2019-03-04 DIAGNOSIS — Z170 Estrogen receptor positive status [ER+]: Secondary | ICD-10-CM | POA: Diagnosis not present

## 2019-03-04 DIAGNOSIS — E559 Vitamin D deficiency, unspecified: Secondary | ICD-10-CM | POA: Diagnosis not present

## 2019-03-04 DIAGNOSIS — E785 Hyperlipidemia, unspecified: Secondary | ICD-10-CM | POA: Diagnosis not present

## 2019-03-04 DIAGNOSIS — Z23 Encounter for immunization: Secondary | ICD-10-CM | POA: Diagnosis not present

## 2019-03-04 DIAGNOSIS — R972 Elevated prostate specific antigen [PSA]: Secondary | ICD-10-CM | POA: Diagnosis not present

## 2019-03-04 DIAGNOSIS — M8580 Other specified disorders of bone density and structure, unspecified site: Secondary | ICD-10-CM | POA: Diagnosis not present

## 2019-03-04 DIAGNOSIS — D509 Iron deficiency anemia, unspecified: Secondary | ICD-10-CM | POA: Diagnosis not present

## 2019-03-04 DIAGNOSIS — R001 Bradycardia, unspecified: Secondary | ICD-10-CM | POA: Diagnosis not present

## 2019-03-04 DIAGNOSIS — E782 Mixed hyperlipidemia: Secondary | ICD-10-CM | POA: Diagnosis not present

## 2019-03-04 DIAGNOSIS — I6522 Occlusion and stenosis of left carotid artery: Secondary | ICD-10-CM | POA: Diagnosis not present

## 2019-03-05 ENCOUNTER — Ambulatory Visit (INDEPENDENT_AMBULATORY_CARE_PROVIDER_SITE_OTHER): Payer: PPO | Admitting: Psychology

## 2019-03-05 DIAGNOSIS — F4310 Post-traumatic stress disorder, unspecified: Secondary | ICD-10-CM | POA: Diagnosis not present

## 2019-03-08 ENCOUNTER — Other Ambulatory Visit: Payer: Self-pay | Admitting: *Deleted

## 2019-03-08 NOTE — Patient Outreach (Signed)
[place] [facility]) Care Management  03/08/2019  TIANNAH GREENLY August 04, 1966 [id number]   CSW made contact with pt today who reports she is "social distancing" and doing ok. She is wanting to push herself to be more active/productive and is setting a goal to make a list of projects, self care, etc and try to work on this daily. She is having hand pain still and her therapy sessions have gone to virtual for right now.  CSW offered support and encouragement- will plan to check in with pt in the next 7-10 days.  Eduard Clos, MSW, [place] Worker  [place] [phone]

## 2019-03-10 ENCOUNTER — Ambulatory Visit (INDEPENDENT_AMBULATORY_CARE_PROVIDER_SITE_OTHER): Payer: PPO | Admitting: Psychology

## 2019-03-10 DIAGNOSIS — F4310 Post-traumatic stress disorder, unspecified: Secondary | ICD-10-CM | POA: Diagnosis not present

## 2019-03-15 ENCOUNTER — Ambulatory Visit (INDEPENDENT_AMBULATORY_CARE_PROVIDER_SITE_OTHER): Payer: PPO | Admitting: Psychology

## 2019-03-15 DIAGNOSIS — F4310 Post-traumatic stress disorder, unspecified: Secondary | ICD-10-CM | POA: Diagnosis not present

## 2019-03-17 DIAGNOSIS — I83813 Varicose veins of bilateral lower extremities with pain: Secondary | ICD-10-CM | POA: Diagnosis not present

## 2019-03-17 DIAGNOSIS — F332 Major depressive disorder, recurrent severe without psychotic features: Secondary | ICD-10-CM | POA: Diagnosis not present

## 2019-03-17 DIAGNOSIS — M79642 Pain in left hand: Secondary | ICD-10-CM | POA: Diagnosis not present

## 2019-03-17 DIAGNOSIS — M654 Radial styloid tenosynovitis [de Quervain]: Secondary | ICD-10-CM | POA: Diagnosis not present

## 2019-03-18 ENCOUNTER — Encounter: Payer: Self-pay | Admitting: Gastroenterology

## 2019-03-18 ENCOUNTER — Ambulatory Visit (INDEPENDENT_AMBULATORY_CARE_PROVIDER_SITE_OTHER): Payer: PPO | Admitting: Gastroenterology

## 2019-03-18 ENCOUNTER — Other Ambulatory Visit: Payer: Self-pay

## 2019-03-18 VITALS — Ht 68.0 in | Wt 215.0 lb

## 2019-03-18 DIAGNOSIS — F322 Major depressive disorder, single episode, severe without psychotic features: Secondary | ICD-10-CM | POA: Diagnosis not present

## 2019-03-18 DIAGNOSIS — G4710 Hypersomnia, unspecified: Secondary | ICD-10-CM | POA: Diagnosis not present

## 2019-03-18 DIAGNOSIS — D509 Iron deficiency anemia, unspecified: Secondary | ICD-10-CM

## 2019-03-18 DIAGNOSIS — K219 Gastro-esophageal reflux disease without esophagitis: Secondary | ICD-10-CM | POA: Diagnosis not present

## 2019-03-18 DIAGNOSIS — R5382 Chronic fatigue, unspecified: Secondary | ICD-10-CM | POA: Diagnosis not present

## 2019-03-18 DIAGNOSIS — G4733 Obstructive sleep apnea (adult) (pediatric): Secondary | ICD-10-CM | POA: Diagnosis not present

## 2019-03-18 DIAGNOSIS — R195 Other fecal abnormalities: Secondary | ICD-10-CM

## 2019-03-18 DIAGNOSIS — M549 Dorsalgia, unspecified: Secondary | ICD-10-CM | POA: Diagnosis not present

## 2019-03-18 MED ORDER — NA SULFATE-K SULFATE-MG SULF 17.5-3.13-1.6 GM/177ML PO SOLN: 1.0000 | Freq: Once | ORAL | 0 refills | Status: AC

## 2019-03-18 NOTE — Patient Instructions (Signed)
You have been scheduled for an endoscopy and colonoscopy. Please follow the instructions sent through my chart.  Please pick up your prep supplies at the pharmacy within the next 1-3 days. If you use inhalers (even only as needed), please bring them with you on the day of your procedure.

## 2019-03-18 NOTE — Progress Notes (Signed)
History of Present Illness: This is a 53 year old female referred by Ernestene Kiel, MD for iron deficiency anemia and heme positive stool.  Hemoglobin=10.3, ferritin=8, iron saturation=9%. She has a prior history of iron deficiency and is status post Roux-en-Y gastric bypass in 2005.  EGD performed in June 2018 showed a Roux-en-Y bypass otherwise unremarkable.  Colonoscopy performed in June 2016 for heme positive stool, family history of colon cancer and personal history of colon polyps (2012) was normal with an adequate prep noted.  Her reflux symptoms are currently well controlled on omeprazole daily.  Naprosyn is listed on her medication list however she states she is no longer taking and denies the use of any other aspirin or NSAID products. Denies weight loss, abdominal pain, diarrhea, change in stool caliber, melena, hematochezia, nausea, vomiting, dysphagia, chest pain.    Allergies  Allergen Reactions  . Oxycodone-Acetaminophen Hives  . Topamax [Topiramate] Shortness Of Breath and Other (See Comments)    Really wierd feeling Off balance; cloudy thinking Really wierd feeling  . Sumatriptan Other (See Comments)    Difficulty breathing  . Flexeril [Cyclobenzaprine Hcl]   . Imitrex [Sumatriptan Base]   . Mobic [Meloxicam]   . Morphine And Related   . Percocet [Oxycodone-Acetaminophen]   . Pork (Porcine) Protein     Other reaction(s): Other (See Comments) Migraines   Outpatient Medications Prior to Visit  Medication Sig Dispense Refill  . buPROPion (WELLBUTRIN SR) 150 MG 12 hr tablet Take 150 mg by mouth daily. 2 tablets by mouth every morning and 1 tablet in afternoon    . cetirizine (ZYRTEC) 10 MG tablet Take 1 tablet (10 mg total) by mouth daily. 1 tablet 0  . DULoxetine (CYMBALTA) 30 MG capsule Take 90 mg by mouth daily.    . Ferrous Sulfate Dried (SLOW RELEASE IRON) 45 MG TBCR Take 1 tablet by mouth daily.    Marland Kitchen gabapentin (NEURONTIN) 300 MG capsule Take 300 mg by mouth 3  (three) times daily. Take 2 capsules in the morning and 1 capsule at bedtime    . ipratropium (ATROVENT) 0.03 % nasal spray Place 2 sprays into both nostrils as needed.  30 mL 12  . ketoconazole (NIZORAL) 2 % shampoo Apply 1 application topically daily.    Marland Kitchen levothyroxine (SYNTHROID, LEVOTHROID) 88 MCG tablet Take 88 mcg by mouth daily before breakfast.    . methocarbamol (ROBAXIN) 500 MG tablet Take 1 tablet (500 mg total) by mouth 2 (two) times daily. (Patient taking differently: Take 500 mg by mouth 2 (two) times daily. Take 1.5 tablets by mouth every 4 hours) 20 tablet 0  . naproxen (NAPROSYN) 500 MG tablet TAKE 1 TABLET BY MOUTH EVERY 12 HOURS AS NEEDED 16 tablet 3  . nystatin cream (MYCOSTATIN) Apply 1 application topically daily as needed for dry skin.    Marland Kitchen olopatadine (PATANOL) 0.1 % ophthalmic solution Place 1 drop into both eyes daily as needed for allergies.     Marland Kitchen omeprazole (PRILOSEC) 20 MG capsule Take 20 mg by mouth daily.    Vladimir Faster Glycol-Propyl Glycol (SYSTANE) 0.4-0.3 % SOLN Apply 1 drop to eye as needed.    . polyethylene glycol (MIRALAX / GLYCOLAX) 17 g packet Take 17 g by mouth 2 (two) times daily.    . potassium chloride (K-DUR,KLOR-CON) 10 MEQ tablet Take 1 tablet by mouth daily.    . promethazine (PHENERGAN) 25 MG tablet Take 25 mg by mouth every 6 (six) hours as needed for nausea or vomiting.    Marland Kitchen  rOPINIRole (REQUIP) 2 MG tablet Take 2 mg by mouth 2 (two) times daily.    Marland Kitchen terconazole (TERAZOL 3) 0.8 % vaginal cream Place 1 applicator vaginally at bedtime.    . torsemide (DEMADEX) 100 MG tablet Take 50 mg by mouth daily.     . traZODone (DESYREL) 100 MG tablet Take 200 mg by mouth at bedtime.     No facility-administered medications prior to visit.    Past Medical History:  Diagnosis Date  . Allergic rhinitis   . Anemia   . Anxiety disorder   . Chronic fatigue   . Chronic headaches   . Chronic pain   . Depression   . Endometriosis   . Family history of colon  cancer    mother,uncles,aunts  . Fibromyalgia   . Gallstones   . GERD (gastroesophageal reflux disease)   . Hypothyroidism   . IBS (irritable bowel syndrome)   . Insomnia   . Iron deficiency   . Laryngopharyngeal reflux (LPR)   . Obesity   . Personal history of colonic polyps 04/2006   polypoid  . PONV (postoperative nausea and vomiting)   . RLS (restless legs syndrome)    Past Surgical History:  Procedure Laterality Date  . ANTERIOR CRUCIATE LIGAMENT REPAIR Left 2003  . APPENDECTOMY  1993  . CARPAL TUNNEL RELEASE Left 09/25/2017   Procedure: LEFT CARPAL TUNNEL RELEASE;  Surgeon: Daryll Brod, MD;  Location: [place];  Service: Orthopedics;  Laterality: Left;  . CHOLECYSTECTOMY  1999  . COLONOSCOPY  2009   [place]  . REPAIR ANKLE LIGAMENT Left 2001  . ROUX-EN-Y GASTRIC BYPASS  04/2004   Dr Johnathan Hausen  . TONSILLECTOMY AND ADENOIDECTOMY  1976   Social History   Socioeconomic History  . Marital status: Single    Spouse name: Not on file  . Number of children: 0  . Years of education: Not on file  . Highest education level: Not on file  Occupational History  . Occupation: RN/disabled  Social Needs  . Financial resource strain: Not on file  . Food insecurity:    Worry: Not on file    Inability: Not on file  . Transportation needs:    Medical: Not on file    Non-medical: Not on file  Tobacco Use  . Smoking status: Never Smoker  . Smokeless tobacco: Never Used  Substance and Sexual Activity  . Alcohol use: Not Currently    Alcohol/week: 0.0 standard drinks    Comment: rarely  . Drug use: No  . Sexual activity: Not on file  Lifestyle  . Physical activity:    Days per week: Not on file    Minutes per session: Not on file  . Stress: Not on file  Relationships  . Social connections:    Talks on phone: Not on file    Gets together: Not on file    Attends religious service: Not on file    Active member of club or organization: Not on file     Attends meetings of clubs or organizations: Not on file    Relationship status: Not on file  Other Topics Concern  . Not on file  Social History Narrative  . Not on file   Family History  Problem Relation Age of Onset  . Colon cancer Mother        uncles,aunts  . Diabetes Father        paternal grandmother,sister  . Leukemia Father   . Lymphoma Father   .  Skin cancer Father   . Heart disease Father   . Colon polyps Sister        1st degree cousins  . Diabetes Sister   . Stomach cancer Neg Hx         Physical Exam: Telemedicine visit - not performed   Assessment and Recommendations:  1.  Iron deficiency anemia and Hemoccult positive stool.  Iron deficiency could be due to poor absorption status post Roux-en-Y bypass.  Rule out colorectal neoplasms, ulcer, gastritis, AVMs.  Continue iron once daily however will hold for 5 days prior to colonoscopy.  Schedule colonoscopy and EGD urgently. The risks (including bleeding, perforation, infection, missed lesions, medication reactions and possible hospitalization or surgery if complications occur), benefits, and alternatives to colonoscopy with possible biopsy and possible polypectomy were discussed with the patient and they consent to proceed. The risks (including bleeding, perforation, infection, missed lesions, medication reactions and possible hospitalization or surgery if complications occur), benefits, and alternatives to endoscopy with possible biopsy and possible dilation were discussed with the patient and they consent to proceed.   2.  Personal history of adenomatous colon polyps and family history of colon cancer.  Colonoscopy as above.  3.  GERD.  Continue omeprazole 20 mg daily and follow standard antireflux measures.  4.  Constipation.  Continue MiraLAX twice daily.   These services were provided via telemedicine, audio and visual.  The patient was at home and the provider was in the office, alone.  We discussed the  limitations of evaluation and management by telemedicine and the availability of in person appointments.  Patient consented for this telemedicine visit and is aware of possible charges for this service.  The other person participating in the telemedicine service was Marlon Pel, CMA who reviewed medications, allergies, past history and completed AVS.  Time spent on call: 14 minutes

## 2019-03-19 ENCOUNTER — Other Ambulatory Visit: Payer: Self-pay | Admitting: *Deleted

## 2019-03-19 ENCOUNTER — Ambulatory Visit: Payer: PPO | Admitting: Psychology

## 2019-03-19 NOTE — Patient Outreach (Signed)
[place] [facility]) Care Management  03/19/2019  Paige [name] 1966-08-03 [id number]   CSW spoke with pt who was in good spirits and feeling well. She reports getting an injection in her left wrist this week and also completing a sleep study (at home).  She is now planning for an EGD next week. Overall, pt reports and appears to be managing the COVID restrictions well. CSW talked with her at length offering support and setting some new goals.  Pt is motivated to work on these goals of weight loss to include healthy eating, exercise/activity and overall self care/focus.  CSW plans a follow up call next week after the EGD for updates and support.   Eduard Clos, MSW, [place] Worker  [place] [phone]

## 2019-03-22 ENCOUNTER — Ambulatory Visit (INDEPENDENT_AMBULATORY_CARE_PROVIDER_SITE_OTHER): Payer: PPO | Admitting: Psychology

## 2019-03-22 DIAGNOSIS — F4310 Post-traumatic stress disorder, unspecified: Secondary | ICD-10-CM | POA: Diagnosis not present

## 2019-03-24 ENCOUNTER — Telehealth: Payer: Self-pay | Admitting: *Deleted

## 2019-03-24 NOTE — Telephone Encounter (Signed)
Covid-19 travel screening questions  Have you traveled in the last 14 days? no If yes where?  Do you now or have you had a fever in the last 14 days? no  Do you have any respiratory symptoms of shortness of breath or cough now or in the last 14 days? no  Do you have any family members or close contacts with diagnosed or suspected Covid-19? No  Pt is aware that her care partner will be asked to wait in the car during her procedure.

## 2019-03-25 ENCOUNTER — Ambulatory Visit (AMBULATORY_SURGERY_CENTER): Payer: PPO | Admitting: Gastroenterology

## 2019-03-25 ENCOUNTER — Other Ambulatory Visit: Payer: Self-pay

## 2019-03-25 ENCOUNTER — Encounter: Payer: Self-pay | Admitting: Gastroenterology

## 2019-03-25 VITALS — BP 132/67 | HR 77 | Temp 98.3°F | Resp 12 | Ht 68.0 in | Wt 215.0 lb

## 2019-03-25 DIAGNOSIS — D640 Hereditary sideroblastic anemia: Secondary | ICD-10-CM | POA: Diagnosis not present

## 2019-03-25 DIAGNOSIS — R195 Other fecal abnormalities: Secondary | ICD-10-CM | POA: Diagnosis not present

## 2019-03-25 DIAGNOSIS — R1013 Epigastric pain: Secondary | ICD-10-CM | POA: Diagnosis not present

## 2019-03-25 DIAGNOSIS — K2210 Ulcer of esophagus without bleeding: Secondary | ICD-10-CM | POA: Diagnosis not present

## 2019-03-25 DIAGNOSIS — G894 Chronic pain syndrome: Secondary | ICD-10-CM | POA: Diagnosis not present

## 2019-03-25 DIAGNOSIS — D509 Iron deficiency anemia, unspecified: Secondary | ICD-10-CM

## 2019-03-25 DIAGNOSIS — K6389 Other specified diseases of intestine: Secondary | ICD-10-CM | POA: Diagnosis not present

## 2019-03-25 DIAGNOSIS — K219 Gastro-esophageal reflux disease without esophagitis: Secondary | ICD-10-CM | POA: Diagnosis not present

## 2019-03-25 MED ORDER — SODIUM CHLORIDE 0.9 % IV SOLN: 500.0000 mL | Freq: Once | INTRAVENOUS | Status: DC

## 2019-03-25 MED ORDER — OMEPRAZOLE 40 MG PO CPDR: 40.0000 mg | DELAYED_RELEASE_CAPSULE | Freq: Every day | ORAL | 3 refills | Status: DC

## 2019-03-25 NOTE — Progress Notes (Signed)
Per recovery, they rescheduled this pt for a colonoscopy with Dr. Fuller Plan because she was not completely cleaned out today when she came for her ECL.  Per Dr. Fuller Plan, she will need a 2 day prep and 5 days of Miralax bid prior to procedure on May 7th.  Instructions printed and taken to recovery where Megan reviewed with pt.  Patient was instructed to call Estill Bamberg, CMA for Dr. Fuller Plan with any questions. Acknowledgement was signed.

## 2019-03-25 NOTE — Patient Instructions (Signed)
YOU HAD AN ENDOSCOPIC PROCEDURE TODAY AT THE  ENDOSCOPY CENTER:   Refer to the procedure report that was given to you for any specific questions about what was found during the examination.  If the procedure report does not answer your questions, please call your gastroenterologist to clarify.  If you requested that your care partner not be given the details of your procedure findings, then the procedure report has been included in a sealed envelope for you to review at your convenience later.  YOU SHOULD EXPECT: Some feelings of bloating in the abdomen. Passage of more gas than usual.  Walking can help get rid of the air that was put into your GI tract during the procedure and reduce the bloating. If you had a lower endoscopy (such as a colonoscopy or flexible sigmoidoscopy) you may notice spotting of blood in your stool or on the toilet paper. If you underwent a bowel prep for your procedure, you may not have a normal bowel movement for a few days.  Please Note:  You might notice some irritation and congestion in your nose or some drainage.  This is from the oxygen used during your procedure.  There is no need for concern and it should clear up in a day or so.  SYMPTOMS TO REPORT IMMEDIATELY:   Following upper endoscopy (EGD)  Vomiting of blood or coffee ground material  New chest pain or pain under the shoulder blades  Painful or persistently difficult swallowing  New shortness of breath  Fever of 100F or higher  Black, tarry-looking stools  For urgent or emergent issues, a gastroenterologist can be reached at any hour by calling [phone].   DIET:  We do recommend a small meal at first, but then you may proceed to your regular diet.  Drink plenty of fluids but you should avoid alcoholic beverages for 24 hours.  ACTIVITY:  You should plan to take it easy for the rest of today and you should NOT DRIVE or use heavy machinery until tomorrow (because of the sedation medicines used  during the test).    FOLLOW UP: Our staff will call the number listed on your records the next business day following your procedure to check on you and address any questions or concerns that you may have regarding the information given to you following your procedure. If we do not reach you, we will leave a message.  However, if you are feeling well and you are not experiencing any problems, there is no need to return our call.  We will assume that you have returned to your regular daily activities without incident.  If any biopsies were taken you will be contacted by phone or by letter within the next 1-3 weeks.  Please call us at [phone] if you have not heard about the biopsies in 3 weeks.    SIGNATURES/CONFIDENTIALITY: You and/or your care partner have signed paperwork which will be entered into your electronic medical record.  These signatures attest to the fact that that the information above on your After Visit Summary has been reviewed and is understood.  Full responsibility of the confidentiality of this discharge information lies with you and/or your care-partner. 

## 2019-03-25 NOTE — Op Note (Addendum)
[facility] Patient Name: Paige [name] Procedure Date: 03/25/2019 10:22 AM MRN: [id number] Endoscopist: Ladene Artist , MD Age: 53 Referring MD:  Date of Birth: 1966-09-12 Gender: Female Account #: [id number] Procedure:                Upper GI endoscopy Indications:              Iron deficiency anemia, Heme positive stool Medicines:                Monitored Anesthesia Care Procedure:                Pre-Anesthesia Assessment:                           - Prior to the procedure, a History and Physical                            was performed, and patient medications and                            allergies were reviewed. The patient's tolerance of                            previous anesthesia was also reviewed. The risks                            and benefits of the procedure and the sedation                            options and risks were discussed with the patient.                            All questions were answered, and informed consent                            was obtained. Prior Anticoagulants: The patient has                            taken no previous anticoagulant or antiplatelet                            agents. ASA Grade Assessment: II - A patient with                            mild systemic disease. After reviewing the risks                            and benefits, the patient was deemed in                            satisfactory condition to undergo the procedure.                           After obtaining informed consent, the endoscope was  passed under direct vision. Throughout the                            procedure, the patient's blood pressure, pulse, and                            oxygen saturations were monitored continuously. The                            Endoscope was introduced through the mouth, and                            advanced to the jejunum. The upper GI endoscopy was   accomplished without difficulty. The patient                            tolerated the procedure well. Scope In: Scope Out: Findings:                 Two 6 mm white plaques were found in the distal                            esophagus, 40 cm from the incisors. Biopsies were                            taken with a cold forceps for histology.                           The exam of the esophagus was otherwise normal.                           Evidence of a Roux-en-Y gastrojejunostomy was                            found. The gastrojejunal anastomosis was                            characterized by healthy appearing mucosa. This was                            traversed. The pouch-to-jejunum limb was                            characterized by healthy appearing mucosa. EGJ at                            64 m and the anastomosis at 46 cm. The afferent and                            efferent limbs were examined for a short distance.                            Two small erosions noted in the afferent limb.  Biopsies obtained with cold forceps for histology. Complications:            No immediate complications. Estimated Blood Loss:     Estimated blood loss was minimal. Impression:               - Two white plaques in the distal esophagus.                            Biopsied.                           - Roux-en-Y gastrojejunostomy with gastrojejunal                            anastomosis characterized by healthy appearing                            mucosa.                           - Two small jejunal erosions.                           - Otherwise normal appearing post Roux-en-Y. Recommendation:           - Patient has a contact number available for                            emergencies. The signs and symptoms of potential                            delayed complications were discussed with the                            patient. Return to normal activities tomorrow.                             Written discharge instructions were provided to the                            patient.                           - Resume previous diet.                           - Continue present medications.                           - Resume Fe until 5 days before bowel prep and then                            hold until colonoscopy has been completed.                           - Increase omeprazole to 40 mg po qam, 1 year of  refills.                           - Await pathology results.                           - No aspirin, ibuprofen, naproxen, or other                            non-steroidal anti-inflammatory drugs.                           - Perform a colonoscopy at the next available                            appointment with a 2 day bowel prep and Miralax bid                            for 5 days prior to bowel prep. Ladene Artist, MD 03/25/2019 10:46:30 AM This report has been signed electronically.

## 2019-03-25 NOTE — Progress Notes (Signed)
Called to room to assist during endoscopic procedure.  Patient ID and intended procedure confirmed with present staff. Received instructions for my participation in the procedure from the performing physician.  

## 2019-03-25 NOTE — Progress Notes (Signed)
Report given to PACU, vss 

## 2019-03-26 ENCOUNTER — Ambulatory Visit: Payer: PPO | Admitting: Psychology

## 2019-03-26 ENCOUNTER — Telehealth: Payer: Self-pay | Admitting: *Deleted

## 2019-03-26 ENCOUNTER — Other Ambulatory Visit: Payer: Self-pay | Admitting: *Deleted

## 2019-03-26 NOTE — Telephone Encounter (Signed)
  Follow up Call-  Call back number 03/25/2019 05/14/2017  Post procedure Call Back phone  # [phone]  Permission to leave phone message Yes Yes  Some recent data might be hidden     Patient questions:  Do you have a fever, pain , or abdominal swelling? no Pain Score  0  Have you tolerated food without any problems? yes  Have you been able to return to your normal activities? yes  Do you have any questions about your discharge instructions: Diet   no Medications  no Follow up visit  no  Do you have questions or concerns about your Care? no  Actions: * If pain score is 4 or above: No action needed   Pt is having back pain, which she states is a chronic problem for her.  She states she is not having any discomfort from the EGD

## 2019-03-26 NOTE — Patient Outreach (Signed)
[facility]) Care Management  03/26/2019  REINA WILTON [birth year] [id number]   CSW spoke with pt today who reports significant pain as returned. She is a bit frustrated by this and yet was excited to share she has taken steps with goals related to activity/exercise, weight loss, etc.  She is motivated to continue with this and CSW will follow for support and further needs as identified.   Eduard Clos, MSW, [place] Worker  [place] [phone]

## 2019-03-31 ENCOUNTER — Encounter: Payer: Self-pay | Admitting: Gastroenterology

## 2019-03-31 ENCOUNTER — Ambulatory Visit (INDEPENDENT_AMBULATORY_CARE_PROVIDER_SITE_OTHER): Payer: PPO | Admitting: Psychology

## 2019-03-31 DIAGNOSIS — F4310 Post-traumatic stress disorder, unspecified: Secondary | ICD-10-CM | POA: Diagnosis not present

## 2019-04-02 ENCOUNTER — Ambulatory Visit: Payer: PPO | Admitting: Psychology

## 2019-04-06 ENCOUNTER — Telehealth: Payer: Self-pay | Admitting: *Deleted

## 2019-04-06 NOTE — Telephone Encounter (Signed)
Covid-19 travel screening questions  Have you traveled in the last 14 days?no If yes where?  Do you now or have you had a fever in the last 14 days?no Do you have any respiratory symptoms of shortness of breath or cough now or in the last 14 days?no  Do you have a medical history of Congestive Heart Failure?  Do you have a medical history of lung disease?  Do you have any family members or close contacts with diagnosed or suspected Covid-19?no   PT made aware of care partner policy and wearing her own PPE if she has it. SM

## 2019-04-07 ENCOUNTER — Ambulatory Visit (INDEPENDENT_AMBULATORY_CARE_PROVIDER_SITE_OTHER): Payer: PPO | Admitting: Psychology

## 2019-04-07 DIAGNOSIS — F4310 Post-traumatic stress disorder, unspecified: Secondary | ICD-10-CM

## 2019-04-08 ENCOUNTER — Encounter: Payer: Self-pay | Admitting: Gastroenterology

## 2019-04-08 ENCOUNTER — Telehealth: Payer: Self-pay | Admitting: Gastroenterology

## 2019-04-08 ENCOUNTER — Other Ambulatory Visit: Payer: Self-pay

## 2019-04-08 ENCOUNTER — Ambulatory Visit (AMBULATORY_SURGERY_CENTER): Payer: PPO | Admitting: Gastroenterology

## 2019-04-08 VITALS — BP 126/68 | HR 63 | Temp 98.7°F | Resp 11 | Ht 68.0 in | Wt 215.0 lb

## 2019-04-08 DIAGNOSIS — Z800 Family history of malignant neoplasm of digestive organs: Secondary | ICD-10-CM

## 2019-04-08 DIAGNOSIS — R195 Other fecal abnormalities: Secondary | ICD-10-CM

## 2019-04-08 DIAGNOSIS — D122 Benign neoplasm of ascending colon: Secondary | ICD-10-CM | POA: Diagnosis not present

## 2019-04-08 DIAGNOSIS — D123 Benign neoplasm of transverse colon: Secondary | ICD-10-CM

## 2019-04-08 DIAGNOSIS — D124 Benign neoplasm of descending colon: Secondary | ICD-10-CM | POA: Diagnosis not present

## 2019-04-08 DIAGNOSIS — D125 Benign neoplasm of sigmoid colon: Secondary | ICD-10-CM

## 2019-04-08 DIAGNOSIS — D508 Other iron deficiency anemias: Secondary | ICD-10-CM

## 2019-04-08 DIAGNOSIS — D509 Iron deficiency anemia, unspecified: Secondary | ICD-10-CM | POA: Diagnosis not present

## 2019-04-08 MED ORDER — GLYCOPYRROLATE 2 MG PO TABS: 2.0000 mg | ORAL_TABLET | Freq: Two times a day (BID) | ORAL | 12 refills | Status: DC

## 2019-04-08 MED ORDER — SODIUM CHLORIDE 0.9 % IV SOLN: 500.0000 mL | Freq: Once | INTRAVENOUS | Status: DC

## 2019-04-08 NOTE — Telephone Encounter (Signed)
Pharmacy calling to state there is a drug interaction with medication robinul, Dr.Stark just sent in and pts potassium she takes that is 61m equivalent. Pharmacy just wanted Dr.Stark to be aware.

## 2019-04-08 NOTE — Patient Instructions (Addendum)
YOU HAD AN ENDOSCOPIC PROCEDURE TODAY AT [facility]:   Refer to the procedure report that was given to you for any specific questions about what was found during the examination.  If the procedure report does not answer your questions, please call your gastroenterologist to clarify.  If you requested that your care partner not be given the details of your procedure findings, then the procedure report has been included in a sealed envelope for you to review at your convenience later.  YOU SHOULD EXPECT: Some feelings of bloating in the abdomen. Passage of more gas than usual.  Walking can help get rid of the air that was put into your GI tract during the procedure and reduce the bloating. If you had a lower endoscopy (such as a colonoscopy or flexible sigmoidoscopy) you may notice spotting of blood in your stool or on the toilet paper. If you underwent a bowel prep for your procedure, you may not have a normal bowel movement for a few days.  Please Note:  You might notice some irritation and congestion in your nose or some drainage.  This is from the oxygen used during your procedure.  There is no need for concern and it should clear up in a day or so.  SYMPTOMS TO REPORT IMMEDIATELY:   Following lower endoscopy (colonoscopy or flexible sigmoidoscopy):  Excessive amounts of blood in the stool  Significant tenderness or worsening of abdominal pains  Swelling of the abdomen that is new, acute  Fever of 100F or higher  For urgent or emergent issues, a gastroenterologist can be reached at any hour by calling [phone].   DIET:  We do recommend a small meal at first, but then you may proceed to your regular diet.  Drink plenty of fluids but you should avoid alcoholic beverages for 24 hours.  ACTIVITY:  You should plan to take it easy for the rest of today and you should NOT DRIVE or use heavy machinery until tomorrow (because of the sedation medicines used during the test).     FOLLOW UP: Our staff will call the number listed on your records the next business day following your procedure to check on you and address any questions or concerns that you may have regarding the information given to you following your procedure. If we do not reach you, we will leave a message.  However, if you are feeling well and you are not experiencing any problems, there is no need to return our call.  We will assume that you have returned to your regular daily activities without incident. We will be calling you two weeks following your procedure to see if you have developed any symptoms of the COVID-19.  If you develop any symptoms before then, please let us know.  If any biopsies were taken you will be contacted by phone or by letter within the next 1-3 weeks.  Please call us at [phone] if you have not heard about the biopsies in 3 weeks.   Await for biopsy results Polyps (handout given) .Marland KitchenNo ibuprofen, naproxen or other non-steroidal anti-inflammatory drugs for 2 weeks after polyp removal. Tylenol okay if needed Repeat colonoscopy likely in 2 years for surveillance based on pathology with a two day bowel prep. Increase Miralax to 1-2 scoops daily Robinul 2 mg twice a day GI office visit in 6 weeks   SIGNATURES/CONFIDENTIALITY: You and/or your care partner have signed paperwork which will be entered into your electronic medical record.  These  signatures attest to the fact that that the information above on your After Visit Summary has been reviewed and is understood.  Full responsibility of the confidentiality of this discharge information lies with you and/or your care-partner. 

## 2019-04-08 NOTE — Progress Notes (Signed)
Called to room to assist during endoscopic procedure.  Patient ID and intended procedure confirmed with present staff. Received instructions for my participation in the procedure from the performing physician.  

## 2019-04-08 NOTE — Telephone Encounter (Signed)
Called pharmacy and told them to cancel prescription for robinul. Called patient and informed her to take OTC IBgard per Dr. Lynne Leader recommendations. Patient verbalized understanding.

## 2019-04-08 NOTE — Telephone Encounter (Signed)
DC Robinul Start IBgard 2 po tid

## 2019-04-08 NOTE — Op Note (Addendum)
[facility] Patient Name: Paige [name] Procedure Date: 04/08/2019 8:41 AM MRN: [id number] Endoscopist: Ladene Artist , MD Age: 53 Referring MD:  Date of Birth: 1966/09/10 Gender: Female Account #: [id number] Procedure:                Colonoscopy Indications:              Family history of 1st-degree relative with                            colorectal cancer, Family history of colorectal                            cancer in multiple 2nd degree relatives, Iron                            deficiency anemia. Occult blood in stools. Medicines:                Monitored Anesthesia Care Procedure:                Pre-Anesthesia Assessment:                           - Prior to the procedure, a History and Physical                            was performed, and patient medications and                            allergies were reviewed. The patient's tolerance of                            previous anesthesia was also reviewed. The risks                            and benefits of the procedure and the sedation                            options and risks were discussed with the patient.                            All questions were answered, and informed consent                            was obtained. Prior Anticoagulants: The patient has                            taken no previous anticoagulant or antiplatelet                            agents. ASA Grade Assessment: II - A patient with                            mild systemic disease. After reviewing the risks  and benefits, the patient was deemed in                            satisfactory condition to undergo the procedure.                           After obtaining informed consent, the colonoscope                            was passed under direct vision. Throughout the                            procedure, the patient's blood pressure, pulse, and                            oxygen saturations were  monitored continuously. The                            Colonoscope was introduced through the anus and                            advanced to the the cecum, identified by                            appendiceal orifice and ileocecal valve. The                            ileocecal valve, appendiceal orifice, and rectum                            were photographed. The quality of the bowel                            preparation was good after extensive suctioning.                            The patient tolerated the procedure well. The                            colonoscopy was somewhat difficult due to a                            tortuous colon. Scope In: 8:48:31 AM Scope Out: 9:19:03 AM Scope Withdrawal Time: 0 hours 23 minutes 36 seconds  Total Procedure Duration: 0 hours 30 minutes 32 seconds  Findings:                 The perianal and digital rectal examinations were                            normal.                           Ten sessile polyps were found in the sigmoid colon                            (  2), descending colon (1), transverse colon (6) and                            ascending colon (1). The polyps were 5 to 9 mm in                            size. These polyps were removed with a cold snare.                            Resection and retrieval were complete.                           The exam was otherwise without abnormality on                            direct and retroflexion views. Complications:            No immediate complications. Estimated blood loss:                            None. Estimated Blood Loss:     Estimated blood loss: none. Impression:               - Ten 5 to 9 mm polyps in the sigmoid colon, in the                            descending colon, in the transverse colon and in                            the ascending colon, removed with a cold snare.                            Resected and retrieved.                           - The examination was  otherwise normal on direct                            and retroflexion views. Recommendation:           - Repeat colonoscopy likely in 2 years for                            surveillance based on pathology results with a 2                            day bowel prep.                           - Patient has a contact number available for                            emergencies. The signs and symptoms of potential  delayed complications were discussed with the                            patient. Return to normal activities tomorrow.                            Written discharge instructions were provided to the                            patient.                           - Resume previous diet.                           - Continue present medications.                           - Increase Miralax to 1-2 scoops daily                           - Robunil 2 mg po bid, 1 year of refills                           - Await pathology results.                           - No aspirin, ibuprofen, naproxen, or other                            non-steroidal anti-inflammatory drugs for 2 weeks                            after polyp removal.                           - GI office visit in 6 weeks. Ladene Artist, MD 04/08/2019 9:25:06 AM This report has been signed electronically.

## 2019-04-08 NOTE — Progress Notes (Signed)
To PACU, VSS. Report to [email or web address] 

## 2019-04-08 NOTE — Progress Notes (Signed)
Pt's states no medical or surgical changes since previsit or office visit. Two days ago patient diagnosis with sleep apnea. No treatment yet.

## 2019-04-09 ENCOUNTER — Other Ambulatory Visit: Payer: Self-pay | Admitting: *Deleted

## 2019-04-09 NOTE — Patient Outreach (Signed)
[facility]) Care Management  04/09/2019  Paige [name] 1966-10-10 [id number]   CSW spoke with pt by phone who is in good spirits; "had my colonoscopy yesterday". She reports "10 polyps were removed".  She is planning to see a hand specialist next week at [facility] for "nerve study" and also will see her mental health therapist next week as well.   Pt has lost more weight (partially due to the prep for colonoscopy) and is spending some time with a friend today. She is hoping to spend some time with her mother for mothers day this weekend.   No concerns or needs identified or voiced at this time. Support and encouragement provided- plan follow up in the next 2 weeks.   Eduard Clos, MSW, [place] Worker  [place] [phone]

## 2019-04-12 ENCOUNTER — Telehealth: Payer: Self-pay

## 2019-04-12 ENCOUNTER — Ambulatory Visit (INDEPENDENT_AMBULATORY_CARE_PROVIDER_SITE_OTHER): Payer: PPO | Admitting: Psychology

## 2019-04-12 DIAGNOSIS — F4310 Post-traumatic stress disorder, unspecified: Secondary | ICD-10-CM | POA: Diagnosis not present

## 2019-04-12 NOTE — Telephone Encounter (Signed)
Called patient who notes on/off LLQ pain since Friday and no significant bowel movements since colonoscopy prep. No severe abdominal pain, fevers, chills, nausea, vomiting, cough, SOB, chest pain, back pain. Pt relates decreased appetite for a couple weeks.  Light diet until pain and constipation resolving. Increase Miralax to tid until achieve a complete bowel movement and then resume qd to bid dosing. If pain and constipation do not improve in next 1-2 days call back for further advice.

## 2019-04-12 NOTE — Telephone Encounter (Signed)
  Follow up Call-  Call back number 04/08/2019 03/25/2019 05/14/2017  Post procedure Call Back phone  # [phone]  Permission to leave phone message Yes Yes Yes  Some recent data might be hidden     Patient questions:  Do you have a fever, pain , or abdominal swelling? Yes.   Pain Score  5 *  Have you tolerated food without any problems? Yes.    Have you been able to return to your normal activities? Yes.    Do you have any questions about your discharge instructions: Diet   No. Medications  No. Follow up visit  No.  Do you have questions or concerns about your Care? Yes.    Actions: * If pain score is 4 or above: Physician/ provider Notified : Lucio Edward, MD.

## 2019-04-13 ENCOUNTER — Encounter: Payer: Self-pay | Admitting: Gastroenterology

## 2019-04-15 ENCOUNTER — Telehealth: Payer: Self-pay | Admitting: *Deleted

## 2019-04-15 DIAGNOSIS — G8929 Other chronic pain: Secondary | ICD-10-CM | POA: Diagnosis not present

## 2019-04-15 DIAGNOSIS — Z6833 Body mass index (BMI) 33.0-33.9, adult: Secondary | ICD-10-CM | POA: Diagnosis not present

## 2019-04-15 NOTE — Telephone Encounter (Signed)
Called pt for post procedure Covid 19 questioning- LM to return my call

## 2019-04-15 NOTE — Telephone Encounter (Signed)
1. Have you developed a fever since your procedure? no  2.   Have you had an respiratory symptoms (SOB or cough) since your procedure? No- just hoarse from allergies   3.   Have you tested positive for COVID 19 since your procedure no  3.   Have you had any family members/close contacts diagnosed with the COVID 19 since your procedure?  no   If any of these questions are a yes, please inquire if patient has been seen by family doctor and route this note to Joylene John, Therapist, sports.

## 2019-04-20 DIAGNOSIS — G4733 Obstructive sleep apnea (adult) (pediatric): Secondary | ICD-10-CM | POA: Diagnosis not present

## 2019-04-21 ENCOUNTER — Ambulatory Visit (INDEPENDENT_AMBULATORY_CARE_PROVIDER_SITE_OTHER): Payer: PPO | Admitting: Psychology

## 2019-04-21 ENCOUNTER — Other Ambulatory Visit: Payer: Self-pay | Admitting: *Deleted

## 2019-04-21 DIAGNOSIS — F4310 Post-traumatic stress disorder, unspecified: Secondary | ICD-10-CM | POA: Diagnosis not present

## 2019-04-23 NOTE — Patient Outreach (Signed)
[facility] Care Management  04/23/2019  Paige [name] 12/01/1966 [id number]  LATE ENTRY  CSW spoke with pt on 04/21/2019. She is doing well and reports she had her GI procedure and is doing ok. She is making masks for friends and has been scrapbooking which she enjoys.   CSW offered support and encouragement. She is awaiting COVID restrictions lifted so she can go to her therapist and receive EMDR.   CSW plans a follow up call on or near her birthday at the end of this month.   Eduard Clos, MSW, [place] Worker  [place] [phone]

## 2019-04-29 ENCOUNTER — Ambulatory Visit (INDEPENDENT_AMBULATORY_CARE_PROVIDER_SITE_OTHER): Payer: PPO | Admitting: Psychology

## 2019-04-29 DIAGNOSIS — F4310 Post-traumatic stress disorder, unspecified: Secondary | ICD-10-CM | POA: Diagnosis not present

## 2019-04-30 ENCOUNTER — Ambulatory Visit (INDEPENDENT_AMBULATORY_CARE_PROVIDER_SITE_OTHER): Payer: PPO | Admitting: Neurology

## 2019-04-30 ENCOUNTER — Other Ambulatory Visit: Payer: Self-pay | Admitting: *Deleted

## 2019-04-30 ENCOUNTER — Other Ambulatory Visit: Payer: Self-pay

## 2019-04-30 DIAGNOSIS — G43709 Chronic migraine without aura, not intractable, without status migrainosus: Secondary | ICD-10-CM | POA: Diagnosis not present

## 2019-04-30 MED ORDER — ONABOTULINUMTOXINA 100 UNITS IJ SOLR
155.0000 [IU] | Freq: Once | INTRAMUSCULAR | Status: AC
  Administered 2019-04-30: 16:00:00 155 [IU] via INTRAMUSCULAR

## 2019-04-30 NOTE — Patient Outreach (Addendum)
[facility]) Care Management  04/30/2019  Paige [name] 10-29-1966 [id number]  CSW spoke to pt who we enroute from MD appointment where she got Botox shots; she is hoping they will help. She reports eating healthy, activities with friends and family and also baking- "I made some delicious chicken in my pressure cooker". She is enjoying activities and has a birthday this weekend; with the COVID restrictions she does not have "big" plans but her mom is taking her shoe shopping today.   Support and encouragement offered- will plan a follow up outreach call next week.   [facility] CM Care Plan Problem One     Most Recent Value  Care Plan Problem One  Patient with depression and anxiety impacting her quality of life.   Role Documenting the Problem One  Clinical Social Worker  Care Plan for Problem One  Active  [facility] Long Term Goal   Patient will report continued appointments arranged for outpatient mental health support.  [facility] Long Term Goal Start Date  04/29/19  [facility] CM Short Term Goal #1   Pt to report ongoin visits with therapist in the next 30 days.   [facility] CM Short Term Goal #1 Start Date  04/29/19  [facility] CM Short Term Goal #2   Pt will report scheduled EMDR appointments for therapeutic intervention in the next 30 days.   [facility] CM Short Term Goal #2 Start Date  04/29/19  Interventions for Short Term Goal #2  Pt awaiting COVID restrictions be lifted for this to resume.        Eduard Clos, MSW, [place] Worker  [place] [phone]

## 2019-04-30 NOTE — Progress Notes (Signed)
[facility]   Procedure Note Botox  Attending: Dr. Metta Clines  Preoperative Diagnosis(es): Chronic migraine  Consent obtained from: the patient Benefits discussed included, but were not limited to decreased muscle tightness, increased joint range of motion, and decreased pain.  Risk discussed included, but were not limited pain and discomfort, bleeding, bruising, excessive weakness, venous thrombosis, muscle atrophy and dysphagia.  Anticipated outcomes of the procedure as well as he risks and benefits of the alternatives to the procedure, and the roles and tasks of the personnel to be involved, were discussed with the patient, and the patient consents to the procedure and agrees to proceed. A copy of the patient medication guide was given to the patient which explains the blackbox warning.  Patients identity and treatment sites confirmed Yes  Details of Procedure: Skin was cleaned with alcohol. Prior to injection, the needle plunger was aspirated to make sure the needle was not within a blood vessel.  There was no blood retrieved on aspiration.    Following is a summary of the muscles injected  And the amount of Botulinum toxin used:  Dilution 200 units of Botox was reconstituted with 4 ml of preservative free normal saline. Time of reconstitution: At the time of the office visit (<30 minutes prior to injection)   Injections  155 total units of Botox was injected with a 30 gauge needle.  Injection Sites: L occipitalis: 15 units- 3 sites  R occiptalis: 15 units- 3 sites  L upper trapezius: 15 units- 3 sites R upper trapezius: 15 units- 3 sits          L paraspinal: 10 units- 2 sites R paraspinal: 10 units- 2 sites  Face L frontalis(2 injection sites):10 units   R frontalis(2 injection sites):10 units         L corrugator: 5 units   R corrugator: 5 units           Procerus: 5 units   L temporalis: 20 units R temporalis: 20 units   Agent:  200 units of botulinum Type A  (Onobotulinum Toxin type A) was reconstituted with 4 ml of preservative free normal saline.  Time of reconstitution: At the time of the office visit (<30 minutes prior to injection)     Total injected (Units): 155  Total wasted (Units): 7.5  Patient tolerated procedure well without complications.   Reinjection is anticipated in 3 months.

## 2019-05-04 DIAGNOSIS — Z6833 Body mass index (BMI) 33.0-33.9, adult: Secondary | ICD-10-CM | POA: Diagnosis not present

## 2019-05-04 DIAGNOSIS — M549 Dorsalgia, unspecified: Secondary | ICD-10-CM | POA: Diagnosis not present

## 2019-05-04 DIAGNOSIS — G4733 Obstructive sleep apnea (adult) (pediatric): Secondary | ICD-10-CM | POA: Diagnosis not present

## 2019-05-04 DIAGNOSIS — F322 Major depressive disorder, single episode, severe without psychotic features: Secondary | ICD-10-CM | POA: Diagnosis not present

## 2019-05-04 DIAGNOSIS — R110 Nausea: Secondary | ICD-10-CM | POA: Diagnosis not present

## 2019-05-06 ENCOUNTER — Ambulatory Visit (INDEPENDENT_AMBULATORY_CARE_PROVIDER_SITE_OTHER): Payer: PPO | Admitting: Psychology

## 2019-05-06 DIAGNOSIS — F4310 Post-traumatic stress disorder, unspecified: Secondary | ICD-10-CM

## 2019-05-07 ENCOUNTER — Ambulatory Visit: Payer: Self-pay | Admitting: *Deleted

## 2019-05-10 ENCOUNTER — Other Ambulatory Visit: Payer: Self-pay | Admitting: *Deleted

## 2019-05-10 NOTE — Patient Outreach (Signed)
[facility]) Care Management  05/10/2019  Paige [name] 1966-07-16 [id number]  CSW spoke with pt who reports she is still waiting to hear about her CPAP. CSW encouraged her to call provider if no call received in the next 2 days. She has been feeling somewhat more depressed and was actually awakened by my call. "I have been sleeping all day". She denies any SI/HI and will monitor this and reach out as needed. She does plan to see her therapist (telephonic session) on Thursday.   CSW offered support and encouragement. CSW suggested she try to re-incorporate the calendar notes to keep up with her diet/exercise goals.  CSW will touch base with pt again next week.    Eduard Clos, MSW, [place] Worker  [place] [phone]

## 2019-05-12 DIAGNOSIS — Z6832 Body mass index (BMI) 32.0-32.9, adult: Secondary | ICD-10-CM | POA: Diagnosis not present

## 2019-05-12 DIAGNOSIS — H1032 Unspecified acute conjunctivitis, left eye: Secondary | ICD-10-CM | POA: Diagnosis not present

## 2019-05-13 ENCOUNTER — Ambulatory Visit (INDEPENDENT_AMBULATORY_CARE_PROVIDER_SITE_OTHER): Payer: PPO | Admitting: Psychology

## 2019-05-13 DIAGNOSIS — F4310 Post-traumatic stress disorder, unspecified: Secondary | ICD-10-CM | POA: Diagnosis not present

## 2019-05-13 DIAGNOSIS — G4733 Obstructive sleep apnea (adult) (pediatric): Secondary | ICD-10-CM | POA: Diagnosis not present

## 2019-05-13 DIAGNOSIS — R062 Wheezing: Secondary | ICD-10-CM | POA: Diagnosis not present

## 2019-05-17 DIAGNOSIS — G4733 Obstructive sleep apnea (adult) (pediatric): Secondary | ICD-10-CM | POA: Diagnosis not present

## 2019-05-19 ENCOUNTER — Other Ambulatory Visit: Payer: Self-pay | Admitting: *Deleted

## 2019-05-19 ENCOUNTER — Ambulatory Visit (INDEPENDENT_AMBULATORY_CARE_PROVIDER_SITE_OTHER): Payer: PPO | Admitting: Psychology

## 2019-05-19 DIAGNOSIS — F4310 Post-traumatic stress disorder, unspecified: Secondary | ICD-10-CM

## 2019-05-19 DIAGNOSIS — G4733 Obstructive sleep apnea (adult) (pediatric): Secondary | ICD-10-CM | POA: Diagnosis not present

## 2019-05-19 DIAGNOSIS — G43009 Migraine without aura, not intractable, without status migrainosus: Secondary | ICD-10-CM | POA: Diagnosis not present

## 2019-05-20 DIAGNOSIS — G4733 Obstructive sleep apnea (adult) (pediatric): Secondary | ICD-10-CM | POA: Diagnosis not present

## 2019-05-20 DIAGNOSIS — L299 Pruritus, unspecified: Secondary | ICD-10-CM | POA: Diagnosis not present

## 2019-05-20 NOTE — Patient Outreach (Signed)
[facility]) Care Management  05/20/2019  WILSIE KERN [birth year] [id number]   CSW spoke with pt who was in good spirits; less pain and doing well.  She continues to have telephonic therapy sessions and hopes to do face to face soon as well as her EMDR.   She is having soe CPAP struggles and plans to meet with DME provider for trial of oxygen too.   Pt shared that she is dreading Fathers Day; stating, "this will be the fourth year without dad".  CSW allowed pt to talk about her father and things she may plan to do to honor and remember him this weekend. Emotional support provided.   CSW will plan a follow up call to pt next week.   Eduard Clos, MSW, [place] Worker  [place] [phone]

## 2019-05-25 ENCOUNTER — Telehealth: Payer: Self-pay

## 2019-05-25 NOTE — Telephone Encounter (Signed)
Covid-19 screening questions   Do you now or have you had a fever in the last 14 days? No  Do you have any respiratory symptoms of shortness of breath or cough now or in the last 14 days? No  Do you have any family members or close contacts with diagnosed or suspected Covid-19 in the past 14 days? No  Have you been tested for Covid-19 and found to be positive? no        

## 2019-05-26 ENCOUNTER — Other Ambulatory Visit (INDEPENDENT_AMBULATORY_CARE_PROVIDER_SITE_OTHER): Payer: PPO

## 2019-05-26 ENCOUNTER — Ambulatory Visit (INDEPENDENT_AMBULATORY_CARE_PROVIDER_SITE_OTHER): Payer: PPO | Admitting: Gastroenterology

## 2019-05-26 ENCOUNTER — Encounter: Payer: Self-pay | Admitting: Gastroenterology

## 2019-05-26 VITALS — BP 92/60 | HR 72 | Temp 98.3°F | Ht 68.0 in | Wt 222.0 lb

## 2019-05-26 DIAGNOSIS — D508 Other iron deficiency anemias: Secondary | ICD-10-CM | POA: Diagnosis not present

## 2019-05-26 DIAGNOSIS — R195 Other fecal abnormalities: Secondary | ICD-10-CM

## 2019-05-26 DIAGNOSIS — R1013 Epigastric pain: Secondary | ICD-10-CM

## 2019-05-26 DIAGNOSIS — K219 Gastro-esophageal reflux disease without esophagitis: Secondary | ICD-10-CM

## 2019-05-26 DIAGNOSIS — D509 Iron deficiency anemia, unspecified: Secondary | ICD-10-CM

## 2019-05-26 LAB — CBC WITH DIFFERENTIAL/PLATELET
Basophils Absolute: 0 10*3/uL (ref 0.0–0.1)
Basophils Relative: 0.7 % (ref 0.0–3.0)
Eosinophils Absolute: 0.1 10*3/uL (ref 0.0–0.7)
Eosinophils Relative: 0.8 % (ref 0.0–5.0)
HCT: 38.7 % (ref 36.0–46.0)
Hemoglobin: 12.6 g/dL (ref 12.0–15.0)
Lymphocytes Relative: 28.3 % (ref 12.0–46.0)
Lymphs Abs: 2 10*3/uL (ref 0.7–4.0)
MCHC: 32.5 g/dL (ref 30.0–36.0)
MCV: 87.6 fl (ref 78.0–100.0)
Monocytes Absolute: 0.6 10*3/uL (ref 0.1–1.0)
Monocytes Relative: 8.8 % (ref 3.0–12.0)
Neutro Abs: 4.3 10*3/uL (ref 1.4–7.7)
Neutrophils Relative %: 61.4 % (ref 43.0–77.0)
Platelets: 209 10*3/uL (ref 150.0–400.0)
RBC: 4.42 Mil/uL (ref 3.87–5.11)
RDW: 14.6 % (ref 11.5–15.5)
WBC: 6.9 10*3/uL (ref 4.0–10.5)

## 2019-05-26 LAB — COMPREHENSIVE METABOLIC PANEL WITH GFR
ALT: 30 U/L (ref 0–35)
AST: 36 U/L (ref 0–37)
Albumin: 4.2 g/dL (ref 3.5–5.2)
Alkaline Phosphatase: 98 U/L (ref 39–117)
BUN: 17 mg/dL (ref 6–23)
CO2: 37 meq/L — ABNORMAL HIGH (ref 19–32)
Calcium: 9.3 mg/dL (ref 8.4–10.5)
Chloride: 95 meq/L — ABNORMAL LOW (ref 96–112)
Creatinine, Ser: 1.09 mg/dL (ref 0.40–1.20)
GFR: 52.3 mL/min — ABNORMAL LOW
Glucose, Bld: 97 mg/dL (ref 70–99)
Potassium: 3.4 meq/L — ABNORMAL LOW (ref 3.5–5.1)
Sodium: 140 meq/L (ref 135–145)
Total Bilirubin: 0.4 mg/dL (ref 0.2–1.2)
Total Protein: 6.9 g/dL (ref 6.0–8.3)

## 2019-05-26 LAB — IBC + FERRITIN
Ferritin: 12.4 ng/mL (ref 10.0–291.0)
Iron: 33 ug/dL — ABNORMAL LOW (ref 42–145)
Saturation Ratios: 6.6 % — ABNORMAL LOW (ref 20.0–50.0)
Transferrin: 356 mg/dL (ref 212.0–360.0)

## 2019-05-26 LAB — TSH: TSH: 1.72 u[IU]/mL (ref 0.35–4.50)

## 2019-05-26 MED ORDER — PROMETHAZINE HCL 12.5 MG PO TABS: 12.5000 mg | ORAL_TABLET | Freq: Three times a day (TID) | ORAL | 0 refills | Status: AC | PRN

## 2019-05-26 MED ORDER — GLYCOPYRROLATE 2 MG PO TABS: 2.0000 mg | ORAL_TABLET | Freq: Two times a day (BID) | ORAL | 11 refills | Status: DC

## 2019-05-26 NOTE — Patient Instructions (Signed)
Your provider has requested that you go to the basement level for lab work before leaving today. Press "B" on the elevator. The lab is located at the first door on the left as you exit the elevator.  We have sent the following medications to your pharmacy for you to pick up at your convenience: glycopyrrolate.   Open omeprazole capsule to take in applesauce/yogurt daily.   Thank you for choosing me and [facility].  Pricilla Riffle. Dagoberto Ligas., MD., [place]al

## 2019-05-26 NOTE — Progress Notes (Signed)
    History of Present Illness: This is a 53 year old female with history of iron deficiency heme positive stool.  She underwent evaluation with colonoscopy and EGD in May and April respectively.  10 small colon polyps removed.  Roux-en-Y anastomosis, jejunal erosions and distal esophagitis noted at upper endoscopy.  She has constipation, IBS, nausea, fatigue, weakness, lower abdominal pain and epigastric pain.  Current Medications, Allergies, Past Medical History, Past Surgical History, Family History and Social History were reviewed in [employer] record.  Physical Exam: General: Well developed, well nourished, no acute distress Head: Normocephalic and atraumatic Eyes:  sclerae anicteric, EOMI Ears: Normal auditory acuity Mouth: No deformity or lesions Lungs: Clear throughout to auscultation Heart: Regular rate and rhythm; no murmurs, rubs or bruits Abdomen: Soft, mild lower abdominal tenderness and mild epigastric tenderness and non distended. No masses, hepatosplenomegaly or hernias noted. Normal Bowel sounds Rectal: Not done Musculoskeletal: Symmetrical with no gross deformities  Pulses:  Normal pulses noted Extremities: No clubbing, cyanosis, edema or deformities noted Neurological: Alert oriented x 4, grossly nonfocal Psychological:  Alert and cooperative.  Depressed mood and affect   Assessment and Recommendations:  1.  Iron deficiency anemia.  Continue iron dailyl.  CBC, Fe, TIBC, ferritin today.  Further follow-up with her PCP for long term mgmt of anemia.  2.  GERD, epigastric pain and nausea. CMP, TSH.  Change omeprazole dosing as follows: 40 mg daily open capsule and take granules with applesauce or yogurt.  If symptoms not improved over the next 2 to 3 weeks increase omeprazole to 40 mg twice daily.  Continue Zofran as needed for nausea..  Patient requests Phenergan for nausea refractory to Zofran.  Phenergan 12.5 mg p.o. 3 times daily as needed #30, no  refills. REV in 6 weeks.   3.  CIC. Lower abdominal pain.  Continue MiraLAX between once and twice daily titrated for complete bowel movement daily.  Begin glycopyrrolate 2 mg twice daily. REV in 6 weeks.   4. S/P Roux-en-Y gastric bypass.

## 2019-05-27 ENCOUNTER — Ambulatory Visit (INDEPENDENT_AMBULATORY_CARE_PROVIDER_SITE_OTHER): Payer: PPO | Admitting: Psychology

## 2019-05-27 ENCOUNTER — Other Ambulatory Visit: Payer: Self-pay | Admitting: *Deleted

## 2019-05-27 DIAGNOSIS — F4310 Post-traumatic stress disorder, unspecified: Secondary | ICD-10-CM | POA: Diagnosis not present

## 2019-05-28 DIAGNOSIS — G5601 Carpal tunnel syndrome, right upper limb: Secondary | ICD-10-CM | POA: Diagnosis not present

## 2019-05-28 DIAGNOSIS — G5603 Carpal tunnel syndrome, bilateral upper limbs: Secondary | ICD-10-CM | POA: Diagnosis not present

## 2019-05-28 NOTE — Patient Outreach (Signed)
[facility]) Care Management  05/28/2019  Paige [name] 07-09-1966 [id number]   CSW spoke with pt by phone on 05/27/2019 who reports she is doing well.  She was at a dentist appointment and I offered to call back today. CSW left a voice message for pt and will plan a follow up call in 2 weeks.    Eduard Clos, MSW, [place] Worker  [place] [phone]

## 2019-06-03 ENCOUNTER — Ambulatory Visit: Payer: PPO | Admitting: Psychology

## 2019-06-03 DIAGNOSIS — M545 Low back pain: Secondary | ICD-10-CM | POA: Diagnosis not present

## 2019-06-03 DIAGNOSIS — M25552 Pain in left hip: Secondary | ICD-10-CM | POA: Diagnosis not present

## 2019-06-03 DIAGNOSIS — M79601 Pain in right arm: Secondary | ICD-10-CM | POA: Diagnosis not present

## 2019-06-03 DIAGNOSIS — M62830 Muscle spasm of back: Secondary | ICD-10-CM | POA: Diagnosis not present

## 2019-06-03 DIAGNOSIS — Z79891 Long term (current) use of opiate analgesic: Secondary | ICD-10-CM | POA: Diagnosis not present

## 2019-06-03 DIAGNOSIS — G894 Chronic pain syndrome: Secondary | ICD-10-CM | POA: Diagnosis not present

## 2019-06-03 DIAGNOSIS — F1120 Opioid dependence, uncomplicated: Secondary | ICD-10-CM | POA: Diagnosis not present

## 2019-06-03 DIAGNOSIS — G8929 Other chronic pain: Secondary | ICD-10-CM | POA: Diagnosis not present

## 2019-06-09 ENCOUNTER — Ambulatory Visit (INDEPENDENT_AMBULATORY_CARE_PROVIDER_SITE_OTHER): Payer: PPO | Admitting: Psychology

## 2019-06-09 DIAGNOSIS — F4310 Post-traumatic stress disorder, unspecified: Secondary | ICD-10-CM

## 2019-06-09 DIAGNOSIS — R062 Wheezing: Secondary | ICD-10-CM | POA: Diagnosis not present

## 2019-06-10 ENCOUNTER — Ambulatory Visit: Payer: Self-pay | Admitting: *Deleted

## 2019-06-12 DIAGNOSIS — R062 Wheezing: Secondary | ICD-10-CM | POA: Diagnosis not present

## 2019-06-13 NOTE — Progress Notes (Signed)
Virtual Visit via Video Note The purpose of this virtual visit is to provide medical care while limiting exposure to the novel coronavirus.    Consent was obtained for video visit:  Yes Answered questions that patient had about telehealth interaction:  Yes I discussed the limitations, risks, security and privacy concerns of performing an evaluation and management service by telemedicine. I also discussed with the patient that there may be a patient responsible charge related to this service. The patient expressed understanding and agreed to proceed.  Pt location: Home Physician Location: Home Name of referring provider:  Ernestene Kiel, MD I connected with [name] at patients initiation/request on 06/14/2019 at 10:30 AM EDT by video enabled telemedicine application and verified that I am speaking with the correct person using two identifiers. Pt MRN:  [id number] Pt DOB:  07-Oct-1966 Video Participants:  [name]   History of Present Illness:  Paige [name] is a 53 year old right-handed woman with depression, hypothyroidism who follows up for migraines.  UPDATE: In November, zonisamide was added to try and further reduce headache frequency but she had to stop it.  Does not remember why she stopped taking it.  Intensity:  moderate Duration:  Half a day Frequency:  2 days a week (none are severe) Frequency of abortive medication: 2 (sometimes 3) days a week Current NSAIDS: None Current analgesics: None Current triptans: None Current ergotamine: None Current anti-emetic: Zofran ODT 4 mg Current muscle relaxants: Baclofen 10 mg Current anti-anxiolytic: Lorriane Shire Pam 1 mg Current sleep aide: Trazodone 100 mg Current Antihypertensive medications: None Current Antidepressant medications: Cymbalta 60 mg, Wellbutrin XR 200 mg Current Anticonvulsant medications: Gabapentin 300 mg 3 times daily (for carpal tunnel pain) Current anti-CGRP: None Current  Vitamins/Herbal/Supplements: B complex, C, co-Q10 Current Antihistamines/Decongestants: None Other therapy:  Botox Other medication:  Requip  Caffeine: Rarely Alcohol: No Smoker: No Diet: Hydrates Exercise: Not routine Depression: Yes; anxiety: Yes. Other pain: Chronic pain Sleep hygiene: Poor  She reports numbness in toes for several months.  HISTORY: Onset:  Since teenager. She has migraines as well as daily headache which have gotten worse over the past 2 years. Location: Migraines: bi-frontal/retro-orbital/temples; Daily headache: Bi-frontal/temporal Quality: Migraine: pounding; Daily headache: non-throbbing Initial intensity: Migraine: 10/10; Daily headache: 6/10 Aura: no Prodrome: no Associated symptoms: Migraine: nausea, photophobia, phonophobia, osmophobia, sometimes vomiting; Daily headache: sometimes photophobia Initial Duration: Migraine: Several hours; Daily headache: constant Initial Frequency: Migraine: every other week; Daily headache: constant Triggers:  Certain scents, bright light, pork Relieving factors:  Rest Activity: Cannot function with migraine  Past NSAIDS: Ibuprofen, naproxen Past analgesics: Percocet (hives), morphine (itching) Past abortive triptans: sumatriptan (difficulty breathing) Past muscle relaxants: cyclobenzaprine (hallucinations) Past anti-emetic: phenergan Past antihypertensive medications: no Past antidepressant medications: Amitriptyline, possibly venlafaxine Past anticonvulsant medications: Depakote, topiramate 100mg  twice daily (side effect), zonisamide 50mg  Past vitamins/Herbal/Supplements: no Other past therapy: acupuncture  Family history of headache: Paternal aunt  History of Subjective Memory Deficits: She has noted short term memory problems since around 2017. She frequently misplaces items around the house. She does get distracted and will not get around to chores around the house. She has  been late paying bills on three occasions. One time, she got disoriented driving on a familiar route. She sometimes has trouble recalling names of familiar people. She also has word-finding difficulty and problems with spelling. She saw a speech pathologist who told her she has memory deficits.  Her thyroid panel was within normal range.   B12 level from 04/07/2018  was 560.  She underwent neuropsychological testing on 09/16/2018 which demonstrated deficits in verbal fluency, working memory, and mental flexibility as well as mildly below expectation performances in processing speed, learning and memory for information that is not repeated, and verbal abstract reasoning.  However, visual-spatial skills, basic attention, nonverbal abstract reasoning, confrontation naming, and memory for information that is repeated were well within normal limits.  There was no evidence of cortical dysfunction or early onset Alzheimer's disease.  Her cognitive difficulties were suspected to be related to her severe depression, high level of pain, and insomnia. Her uncle and aunt on her mother's side had dementia.  Past Medical History: Past Medical History:  Diagnosis Date  . Allergic rhinitis   . Anemia   . Anxiety disorder   . Chronic fatigue   . Chronic headaches   . Chronic pain   . Depression   . Endometriosis   . Family history of colon cancer    mother,uncles,aunts  . Fibromyalgia   . Gallstones   . GERD (gastroesophageal reflux disease)   . Hypothyroidism   . IBS (irritable bowel syndrome)   . Insomnia   . Iron deficiency   . Laryngopharyngeal reflux (LPR)   . Obesity   . Personal history of colonic polyps 04/2006   polypoid  . PONV (postoperative nausea and vomiting)   . RLS (restless legs syndrome)     Medications: Outpatient Encounter Medications as of 06/14/2019  Medication Sig  . buPROPion (WELLBUTRIN SR) 150 MG 12 hr tablet Take 150 mg by mouth daily. 2 tablets by mouth every morning and 1  tablet in afternoon  . cetirizine (ZYRTEC) 10 MG tablet Take 1 tablet (10 mg total) by mouth daily.  . DULoxetine (CYMBALTA) 30 MG capsule Take 90 mg by mouth daily.  . Ferrous Sulfate Dried (SLOW RELEASE IRON) 45 MG TBCR Take 1 tablet by mouth daily.  Marland Kitchen gabapentin (NEURONTIN) 300 MG capsule Take 300 mg by mouth 3 (three) times daily. Take 2 capsules in the morning and 1 capsule at bedtime  . glycopyrrolate (ROBINUL) 2 MG tablet Take 1 tablet (2 mg total) by mouth 2 (two) times daily.  Marland Kitchen ipratropium (ATROVENT) 0.03 % nasal spray Place 2 sprays into both nostrils as needed.   Marland Kitchen levothyroxine (SYNTHROID, LEVOTHROID) 88 MCG tablet Take 88 mcg by mouth daily before breakfast.  . methocarbamol (ROBAXIN) 500 MG tablet Take 1 tablet (500 mg total) by mouth 2 (two) times daily. (Patient taking differently: Take 500 mg by mouth 2 (two) times daily. Take 1.5 tablets by mouth every 4 hours)  . nystatin cream (MYCOSTATIN) Apply 1 application topically daily as needed for dry skin.  Marland Kitchen olopatadine (PATANOL) 0.1 % ophthalmic solution Place 1 drop into both eyes daily as needed for allergies.   Marland Kitchen omeprazole (PRILOSEC) 40 MG capsule Take 1 capsule (40 mg total) by mouth daily.  . ondansetron (ZOFRAN-ODT) 4 MG disintegrating tablet Take 4 mg by mouth every 8 (eight) hours as needed for nausea or vomiting.  Vladimir Faster Glycol-Propyl Glycol (SYSTANE) 0.4-0.3 % SOLN Apply 1 drop to eye as needed.  . polyethylene glycol (MIRALAX / GLYCOLAX) 17 g packet Take 17 g by mouth 2 (two) times daily.  . potassium chloride (K-DUR,KLOR-CON) 10 MEQ tablet Take 1 tablet by mouth daily.  . promethazine (PHENERGAN) 12.5 MG tablet Take 1 tablet (12.5 mg total) by mouth every 8 (eight) hours as needed for nausea or vomiting.  Marland Kitchen rOPINIRole (REQUIP) 2 MG tablet Take 2 mg  by mouth 2 (two) times daily.  Marland Kitchen terconazole (TERAZOL 3) 0.8 % vaginal cream Place 1 applicator vaginally at bedtime.  . torsemide (DEMADEX) 100 MG tablet Take 50 mg by  mouth daily.   . traZODone (DESYREL) 100 MG tablet Take 200 mg by mouth at bedtime.   Facility-Administered Encounter Medications as of 06/14/2019  Medication  . 0.9 %  sodium chloride infusion    Allergies: Allergies  Allergen Reactions  . Oxycodone-Acetaminophen Hives  . Topamax [Topiramate] Shortness Of Breath and Other (See Comments)    Really wierd feeling Off balance; cloudy thinking Really wierd feeling  . Sumatriptan Other (See Comments)    Difficulty breathing  . Flexeril [Cyclobenzaprine Hcl]   . Imitrex [Sumatriptan Base]   . Mobic [Meloxicam]   . Morphine And Related   . Percocet [Oxycodone-Acetaminophen]   . Pork (Porcine) Protein     Other reaction(s): Other (See Comments) Migraines    Family History: Family History  Problem Relation Age of Onset  . Colon cancer Mother        uncles,aunts  . Diabetes Father        paternal grandmother,sister  . Leukemia Father   . Lymphoma Father   . Skin cancer Father   . Heart disease Father   . Colon polyps Sister        1st degree cousins  . Diabetes Sister   . Stomach cancer Neg Hx   . Esophageal cancer Neg Hx   . Rectal cancer Neg Hx     Social History: Social History   Socioeconomic History  . Marital status: Single    Spouse name: Not on file  . Number of children: 0  . Years of education: Not on file  . Highest education level: Not on file  Occupational History  . Occupation: RN/disabled  Social Needs  . Financial resource strain: Not on file  . Food insecurity    Worry: Not on file    Inability: Not on file  . Transportation needs    Medical: Not on file    Non-medical: Not on file  Tobacco Use  . Smoking status: Never Smoker  . Smokeless tobacco: Never Used  Substance and Sexual Activity  . Alcohol use: Not Currently    Alcohol/week: 0.0 standard drinks    Comment: rarely  . Drug use: No  . Sexual activity: Not on file  Lifestyle  . Physical activity    Days per week: Not on file     Minutes per session: Not on file  . Stress: Not on file  Relationships  . Social Herbalist on phone: Not on file    Gets together: Not on file    Attends religious service: Not on file    Active member of club or organization: Not on file    Attends meetings of clubs or organizations: Not on file    Relationship status: Not on file  . Intimate partner violence    Fear of current or ex partner: Not on file    Emotionally abused: Not on file    Physically abused: Not on file    Forced sexual activity: Not on file  Other Topics Concern  . Not on file  Social History Narrative  . Not on file    Observations/Objective:   Temperature 98.2 F (36.8 C), temperature source Oral, height 5\' 9"  (1.753 m), weight 225 lb (102.1 kg). No acute distress.  Alert and oriented.  Speech fluent and  not dysarthric.  Language intact.  Eyes orthophoric on primary gaze.  Face symmetric.  Assessment and Plan:   1.  Migraine without aura, without status migrainosus, not intractable 2.  Numbness and tingling in toes  1.  For preventative management, Botox 2.  For abortive therapy, will try Maxalt 10mg  3.  Limit use of pain relievers to no more than 2 days out of week to prevent risk of rebound or medication-overuse headache. 4.  Keep headache diary 5.  Exercise, hydration, caffeine cessation, sleep hygiene, monitor for and avoid triggers 6.  Consider:  magnesium citrate 400mg  daily, riboflavin 400mg  daily, and coenzyme Q10 100mg  three times daily 7. Always keep in mind that currently taking a hormone or birth control may be a possible trigger or aggravating factor for migraine. 8. NCV-EMG of lower extremities for evaluation of neuropathy. 9. Follow up after NCV-EMG.  Follow Up Instructions:    -I discussed the assessment and treatment plan with the patient. The patient was provided an opportunity to ask questions and all were answered. The patient agreed with the plan and demonstrated an  understanding of the instructions.   The patient was advised to call back or seek an in-person evaluation if the symptoms worsen or if the condition fails to improve as anticipated.   Dudley Major, DO

## 2019-06-14 ENCOUNTER — Other Ambulatory Visit: Payer: Self-pay

## 2019-06-14 ENCOUNTER — Telehealth (INDEPENDENT_AMBULATORY_CARE_PROVIDER_SITE_OTHER): Payer: PPO | Admitting: Neurology

## 2019-06-14 ENCOUNTER — Encounter: Payer: Self-pay | Admitting: Neurology

## 2019-06-14 VITALS — Temp 98.2°F | Ht 69.0 in | Wt 225.0 lb

## 2019-06-14 DIAGNOSIS — G43009 Migraine without aura, not intractable, without status migrainosus: Secondary | ICD-10-CM | POA: Diagnosis not present

## 2019-06-14 DIAGNOSIS — R200 Anesthesia of skin: Secondary | ICD-10-CM | POA: Diagnosis not present

## 2019-06-14 DIAGNOSIS — R202 Paresthesia of skin: Secondary | ICD-10-CM

## 2019-06-14 MED ORDER — RIZATRIPTAN BENZOATE 10 MG PO TABS: ORAL_TABLET | ORAL | 3 refills | Status: DC

## 2019-06-14 NOTE — Addendum Note (Signed)
Addended by: Clois Comber on: 06/14/2019 03:04 PM   Modules accepted: Orders

## 2019-06-16 ENCOUNTER — Ambulatory Visit (INDEPENDENT_AMBULATORY_CARE_PROVIDER_SITE_OTHER): Payer: PPO | Admitting: Psychology

## 2019-06-16 DIAGNOSIS — F4310 Post-traumatic stress disorder, unspecified: Secondary | ICD-10-CM | POA: Diagnosis not present

## 2019-06-17 DIAGNOSIS — M654 Radial styloid tenosynovitis [de Quervain]: Secondary | ICD-10-CM | POA: Diagnosis not present

## 2019-06-17 DIAGNOSIS — Z6829 Body mass index (BMI) 29.0-29.9, adult: Secondary | ICD-10-CM | POA: Diagnosis not present

## 2019-06-17 DIAGNOSIS — M5416 Radiculopathy, lumbar region: Secondary | ICD-10-CM | POA: Diagnosis not present

## 2019-06-18 DIAGNOSIS — G43009 Migraine without aura, not intractable, without status migrainosus: Secondary | ICD-10-CM | POA: Diagnosis not present

## 2019-06-18 DIAGNOSIS — G4733 Obstructive sleep apnea (adult) (pediatric): Secondary | ICD-10-CM | POA: Diagnosis not present

## 2019-06-21 ENCOUNTER — Other Ambulatory Visit: Payer: Self-pay | Admitting: *Deleted

## 2019-06-21 NOTE — Telephone Encounter (Signed)
Opened in error

## 2019-06-21 NOTE — Patient Outreach (Signed)
[facility] [facility]) Care Management  06/21/2019  Paige [name] Mar 15, 1966 [id number]   CSW spoke with pt who reports she may be having back surgery. She is seeing a Neurosurgeon for probable surgery. She states, "I am ready to not be in pain".  CSW offered support and encouragement to her.  CSW discussed case closure at this time and she is agreeable.  Pt understands she can reach out to CSW when/if needs arise or contact [facility] or PCP.   CSW will close case and advise [facility] team and PCP.   Eduard Clos, MSW, LCSW Clinical Social Worker  [place], MSW, [place] Worker  [place] [phone]

## 2019-06-23 ENCOUNTER — Ambulatory Visit (INDEPENDENT_AMBULATORY_CARE_PROVIDER_SITE_OTHER): Payer: PPO | Admitting: Psychology

## 2019-06-23 DIAGNOSIS — F4310 Post-traumatic stress disorder, unspecified: Secondary | ICD-10-CM | POA: Diagnosis not present

## 2019-06-25 DIAGNOSIS — M546 Pain in thoracic spine: Secondary | ICD-10-CM | POA: Diagnosis not present

## 2019-06-25 DIAGNOSIS — M4316 Spondylolisthesis, lumbar region: Secondary | ICD-10-CM | POA: Diagnosis not present

## 2019-06-25 DIAGNOSIS — M4155 Other secondary scoliosis, thoracolumbar region: Secondary | ICD-10-CM | POA: Diagnosis not present

## 2019-06-25 DIAGNOSIS — Q7649 Other congenital malformations of spine, not associated with scoliosis: Secondary | ICD-10-CM | POA: Diagnosis not present

## 2019-06-25 DIAGNOSIS — M48062 Spinal stenosis, lumbar region with neurogenic claudication: Secondary | ICD-10-CM | POA: Diagnosis not present

## 2019-06-25 DIAGNOSIS — M549 Dorsalgia, unspecified: Secondary | ICD-10-CM | POA: Diagnosis not present

## 2019-06-28 DIAGNOSIS — D1801 Hemangioma of skin and subcutaneous tissue: Secondary | ICD-10-CM | POA: Diagnosis not present

## 2019-06-28 DIAGNOSIS — L821 Other seborrheic keratosis: Secondary | ICD-10-CM | POA: Diagnosis not present

## 2019-06-28 DIAGNOSIS — D2272 Melanocytic nevi of left lower limb, including hip: Secondary | ICD-10-CM | POA: Diagnosis not present

## 2019-06-28 DIAGNOSIS — D2271 Melanocytic nevi of right lower limb, including hip: Secondary | ICD-10-CM | POA: Diagnosis not present

## 2019-06-28 DIAGNOSIS — D485 Neoplasm of uncertain behavior of skin: Secondary | ICD-10-CM | POA: Diagnosis not present

## 2019-06-28 DIAGNOSIS — D2372 Other benign neoplasm of skin of left lower limb, including hip: Secondary | ICD-10-CM | POA: Diagnosis not present

## 2019-06-28 DIAGNOSIS — D2261 Melanocytic nevi of right upper limb, including shoulder: Secondary | ICD-10-CM | POA: Diagnosis not present

## 2019-06-28 DIAGNOSIS — D2262 Melanocytic nevi of left upper limb, including shoulder: Secondary | ICD-10-CM | POA: Diagnosis not present

## 2019-06-28 DIAGNOSIS — Z85828 Personal history of other malignant neoplasm of skin: Secondary | ICD-10-CM | POA: Diagnosis not present

## 2019-06-28 DIAGNOSIS — L814 Other melanin hyperpigmentation: Secondary | ICD-10-CM | POA: Diagnosis not present

## 2019-06-28 DIAGNOSIS — L820 Inflamed seborrheic keratosis: Secondary | ICD-10-CM | POA: Diagnosis not present

## 2019-06-28 DIAGNOSIS — D225 Melanocytic nevi of trunk: Secondary | ICD-10-CM | POA: Diagnosis not present

## 2019-06-30 ENCOUNTER — Ambulatory Visit (INDEPENDENT_AMBULATORY_CARE_PROVIDER_SITE_OTHER): Payer: PPO | Admitting: Psychology

## 2019-06-30 DIAGNOSIS — F4310 Post-traumatic stress disorder, unspecified: Secondary | ICD-10-CM

## 2019-07-01 DIAGNOSIS — R5382 Chronic fatigue, unspecified: Secondary | ICD-10-CM | POA: Diagnosis not present

## 2019-07-01 DIAGNOSIS — F4310 Post-traumatic stress disorder, unspecified: Secondary | ICD-10-CM | POA: Diagnosis not present

## 2019-07-01 DIAGNOSIS — R8290 Unspecified abnormal findings in urine: Secondary | ICD-10-CM | POA: Diagnosis not present

## 2019-07-01 DIAGNOSIS — K219 Gastro-esophageal reflux disease without esophagitis: Secondary | ICD-10-CM | POA: Diagnosis not present

## 2019-07-01 DIAGNOSIS — F5101 Primary insomnia: Secondary | ICD-10-CM | POA: Diagnosis not present

## 2019-07-01 DIAGNOSIS — E039 Hypothyroidism, unspecified: Secondary | ICD-10-CM | POA: Diagnosis not present

## 2019-07-01 DIAGNOSIS — M549 Dorsalgia, unspecified: Secondary | ICD-10-CM | POA: Diagnosis not present

## 2019-07-01 DIAGNOSIS — Z79899 Other long term (current) drug therapy: Secondary | ICD-10-CM | POA: Diagnosis not present

## 2019-07-01 DIAGNOSIS — R339 Retention of urine, unspecified: Secondary | ICD-10-CM | POA: Diagnosis not present

## 2019-07-01 DIAGNOSIS — G43009 Migraine without aura, not intractable, without status migrainosus: Secondary | ICD-10-CM | POA: Diagnosis not present

## 2019-07-01 DIAGNOSIS — M797 Fibromyalgia: Secondary | ICD-10-CM | POA: Diagnosis not present

## 2019-07-01 DIAGNOSIS — Z9114 Patient's other noncompliance with medication regimen: Secondary | ICD-10-CM | POA: Diagnosis not present

## 2019-07-01 DIAGNOSIS — D509 Iron deficiency anemia, unspecified: Secondary | ICD-10-CM | POA: Diagnosis not present

## 2019-07-01 DIAGNOSIS — F322 Major depressive disorder, single episode, severe without psychotic features: Secondary | ICD-10-CM | POA: Diagnosis not present

## 2019-07-07 ENCOUNTER — Ambulatory Visit (INDEPENDENT_AMBULATORY_CARE_PROVIDER_SITE_OTHER): Payer: PPO | Admitting: Psychology

## 2019-07-07 DIAGNOSIS — F4310 Post-traumatic stress disorder, unspecified: Secondary | ICD-10-CM | POA: Diagnosis not present

## 2019-07-08 ENCOUNTER — Encounter: Payer: Self-pay | Admitting: *Deleted

## 2019-07-08 DIAGNOSIS — M4726 Other spondylosis with radiculopathy, lumbar region: Secondary | ICD-10-CM | POA: Diagnosis not present

## 2019-07-08 DIAGNOSIS — M5136 Other intervertebral disc degeneration, lumbar region: Secondary | ICD-10-CM | POA: Diagnosis not present

## 2019-07-08 DIAGNOSIS — M48061 Spinal stenosis, lumbar region without neurogenic claudication: Secondary | ICD-10-CM | POA: Diagnosis not present

## 2019-07-12 DIAGNOSIS — G4733 Obstructive sleep apnea (adult) (pediatric): Secondary | ICD-10-CM | POA: Diagnosis not present

## 2019-07-13 ENCOUNTER — Telehealth: Payer: Self-pay | Admitting: Gastroenterology

## 2019-07-13 DIAGNOSIS — R062 Wheezing: Secondary | ICD-10-CM | POA: Diagnosis not present

## 2019-07-13 MED ORDER — DICYCLOMINE HCL 10 MG PO CAPS: 10.0000 mg | ORAL_CAPSULE | Freq: Three times a day (TID) | ORAL | 11 refills | Status: DC | PRN

## 2019-07-13 NOTE — Telephone Encounter (Signed)
Informed patient I was not aware this is a side effect of this medication. Patient states she did not realize the fluid retention at first but went to her PCP which said it could be from glycopyrrolate. Patient states the medication has been working well so she hates to switch medications but the fluid retention has been getting worse the last month. Asked patient to stop medication for now. Please advise Dr. Fuller Plan if you want patient to switch to another medication.

## 2019-07-13 NOTE — Telephone Encounter (Signed)
Pt called to inform that glycopyrrolate helps her stomach pain but is making her retain liquids as she has not been able to urinate normally, she would like to know if there is another med that she can try.

## 2019-07-13 NOTE — Telephone Encounter (Signed)
Change to Robinul 1 mg po bid prn or dicyclomine 10 mg po tid prn

## 2019-07-13 NOTE — Telephone Encounter (Signed)
Informed patient that I am sending dicyclomine to her pharmacy in place of the glycopyrrolate. Patient verbalized understanding.

## 2019-07-14 ENCOUNTER — Ambulatory Visit (INDEPENDENT_AMBULATORY_CARE_PROVIDER_SITE_OTHER): Payer: PPO | Admitting: Psychology

## 2019-07-14 DIAGNOSIS — F4310 Post-traumatic stress disorder, unspecified: Secondary | ICD-10-CM | POA: Diagnosis not present

## 2019-07-15 ENCOUNTER — Other Ambulatory Visit: Payer: Self-pay

## 2019-07-15 ENCOUNTER — Ambulatory Visit (INDEPENDENT_AMBULATORY_CARE_PROVIDER_SITE_OTHER): Payer: PPO | Admitting: Neurology

## 2019-07-15 DIAGNOSIS — R200 Anesthesia of skin: Secondary | ICD-10-CM | POA: Diagnosis not present

## 2019-07-15 DIAGNOSIS — R202 Paresthesia of skin: Secondary | ICD-10-CM

## 2019-07-15 NOTE — Procedures (Signed)
[facility]  [place]  [place], [place] [address] Tel: [phone] Fax:  [phone] Test Date:  07/15/2019  Patient: Paige [name] DOB: Sep 02, 1966 Physician: Narda Amber, DO  Sex: Female Height: 5\' 8"  Ref Phys: Metta Clines, DO  ID#: [id number] Temp: 33.0C Technician:    Patient Complaints: This is a 53 year old female referred for evaluation of bilateral feet paresthesias.  NCV & EMG Findings: Electrodiagnostic testing of the right lower extremity and additional studies of the left shows: 1. Bilateral sural and superficial peroneal sensory responses are within normal limits. 2. Bilateral peroneal and tibial motor responses are within normal limits. 3. Bilateral tibial H reflex studies are within normal limits. 4. There is no evidence of active or chronic motor axonal changes affecting any of the tested muscles.  Motor unit configuration and recruitment pattern is within normal limits.   Impression: This is a normal study of the lower extremities.  In particular, there is no evidence of a sensorimotor polyneuropathy or lumbosacral radiculopathy.     ___________________________ Narda Amber, DO    Nerve Conduction Studies Anti Sensory Summary Table   Site NR Peak (ms) Norm Peak (ms) P-T Amp (V) Norm P-T Amp  Left Sup Peroneal Anti Sensory (Ant Lat Mall)  33C  12 cm    3.4 <4.6 9.1 >4  Right Sup Peroneal Anti Sensory (Ant Lat Mall)  33C  12 cm    2.6 <4.6 10.5 >4  Left Sural Anti Sensory (Lat Mall)  33C  Calf    3.5 <4.6 12.4 >4  Right Sural Anti Sensory (Lat Mall)  33C  Calf    3.4 <4.6 12.5 >4   Motor Summary Table   Site NR Onset (ms) Norm Onset (ms) O-P Amp (mV) Norm O-P Amp Site1 Site2 Delta-0 (ms) Dist (cm) Vel (m/s) Norm Vel (m/s)  Left Peroneal Motor (Ext Dig Brev)  33C  Ankle    4.1 <6.0 4.2 >2.5 B Fib Ankle 8.3 38.0 46 >40  B Fib    12.4  3.5  Poplt B Fib 1.7 9.0 53 >40  Poplt    14.1  3.3         Right Peroneal Motor  (Ext Dig Brev)  33C  Ankle    3.3 <6.0 6.3 >2.5 B Fib Ankle 8.8 41.0 47 >40  B Fib    12.1  5.8  Poplt B Fib 1.3 8.0 62 >40  Poplt    13.4  5.6         Left Tibial Motor (Abd Hall Brev)  33C  Ankle    5.6 <6.0 12.9 >4 Knee Ankle 8.2 38.0 46 >40  Knee    13.8  7.9         Right Tibial Motor (Abd Hall Brev)  33C  Ankle    3.7 <6.0 12.4 >4 Knee Ankle 9.7 41.0 42 >40  Knee    13.4  9.0          H Reflex Studies   NR H-Lat (ms) Lat Norm (ms) L-R H-Lat (ms)  Left Tibial (Gastroc)  33C     32.79 <35 0.00  Right Tibial (Gastroc)  33C     32.79 <35 0.00   EMG   Side Muscle Ins Act Fibs Psw Fasc Number Recrt Dur Dur. Amp Amp. Poly Poly. Comment  Right AntTibialis Nml Nml Nml Nml Nml Nml Nml Nml Nml Nml Nml Nml N/A  Right Gastroc Nml Nml Nml Nml Nml Nml Nml Nml  Nml Nml Nml Nml N/A  Right Flex Dig Long Nml Nml Nml Nml Nml Nml Nml Nml Nml Nml Nml Nml N/A  Right RectFemoris Nml Nml Nml Nml Nml Nml Nml Nml Nml Nml Nml Nml N/A  Right GluteusMed Nml Nml Nml Nml Nml Nml Nml Nml Nml Nml Nml Nml N/A  Left AntTibialis Nml Nml Nml Nml Nml Nml Nml Nml Nml Nml Nml Nml N/A  Left Gastroc Nml Nml Nml Nml Nml Nml Nml Nml Nml Nml Nml Nml N/A  Left Flex Dig Long Nml Nml Nml Nml Nml Nml Nml Nml Nml Nml Nml Nml N/A  Left GluteusMed Nml Nml Nml Nml Nml Nml Nml Nml Nml Nml Nml Nml N/A  Left RectFemoris Nml Nml Nml Nml Nml Nml Nml Nml Nml Nml Nml Nml N/A      Waveforms:

## 2019-07-19 ENCOUNTER — Telehealth: Payer: Self-pay

## 2019-07-19 DIAGNOSIS — R200 Anesthesia of skin: Secondary | ICD-10-CM

## 2019-07-19 DIAGNOSIS — Z4689 Encounter for fitting and adjustment of other specified devices: Secondary | ICD-10-CM | POA: Diagnosis not present

## 2019-07-19 DIAGNOSIS — G43009 Migraine without aura, not intractable, without status migrainosus: Secondary | ICD-10-CM | POA: Diagnosis not present

## 2019-07-19 DIAGNOSIS — M545 Low back pain: Secondary | ICD-10-CM | POA: Diagnosis not present

## 2019-07-19 DIAGNOSIS — G4733 Obstructive sleep apnea (adult) (pediatric): Secondary | ICD-10-CM | POA: Diagnosis not present

## 2019-07-19 DIAGNOSIS — M4716 Other spondylosis with myelopathy, lumbar region: Secondary | ICD-10-CM | POA: Diagnosis not present

## 2019-07-19 DIAGNOSIS — M5416 Radiculopathy, lumbar region: Secondary | ICD-10-CM | POA: Diagnosis not present

## 2019-07-19 DIAGNOSIS — R6889 Other general symptoms and signs: Secondary | ICD-10-CM

## 2019-07-19 DIAGNOSIS — R202 Paresthesia of skin: Secondary | ICD-10-CM

## 2019-07-19 NOTE — Telephone Encounter (Signed)
Called and spoke with Pt, advised her of EMG results and B12 lab recommendation. She requests to have labs drawn at her PCP in [place], they do have a lab. I will fax an order to Dr. Chrys Racer Prochnau's office.

## 2019-07-19 NOTE — Telephone Encounter (Signed)
-----   Message from Pieter Partridge, DO sent at 07/16/2019 12:34 PM EDT ----- Nerve study was normal.  No cause for numbness in feet.  We can check a B12 level to look for cause of paresthesias.

## 2019-07-21 ENCOUNTER — Ambulatory Visit (INDEPENDENT_AMBULATORY_CARE_PROVIDER_SITE_OTHER): Payer: PPO | Admitting: Psychology

## 2019-07-21 DIAGNOSIS — F4310 Post-traumatic stress disorder, unspecified: Secondary | ICD-10-CM | POA: Diagnosis not present

## 2019-07-23 ENCOUNTER — Other Ambulatory Visit: Payer: Self-pay

## 2019-07-26 DIAGNOSIS — R209 Unspecified disturbances of skin sensation: Secondary | ICD-10-CM | POA: Diagnosis not present

## 2019-07-26 DIAGNOSIS — R748 Abnormal levels of other serum enzymes: Secondary | ICD-10-CM | POA: Diagnosis not present

## 2019-07-28 ENCOUNTER — Ambulatory Visit (INDEPENDENT_AMBULATORY_CARE_PROVIDER_SITE_OTHER): Payer: PPO | Admitting: Psychology

## 2019-07-28 DIAGNOSIS — F4310 Post-traumatic stress disorder, unspecified: Secondary | ICD-10-CM | POA: Diagnosis not present

## 2019-07-30 ENCOUNTER — Ambulatory Visit (INDEPENDENT_AMBULATORY_CARE_PROVIDER_SITE_OTHER): Payer: PPO | Admitting: Neurology

## 2019-07-30 ENCOUNTER — Other Ambulatory Visit: Payer: Self-pay

## 2019-07-30 DIAGNOSIS — G43709 Chronic migraine without aura, not intractable, without status migrainosus: Secondary | ICD-10-CM | POA: Diagnosis not present

## 2019-07-30 MED ORDER — ONABOTULINUMTOXINA 100 UNITS IJ SOLR
155.0000 [IU] | Freq: Once | INTRAMUSCULAR | Status: AC
  Administered 2019-07-30: 155 [IU] via INTRAMUSCULAR

## 2019-07-30 NOTE — Progress Notes (Signed)
[facility]   Procedure Note Botox  Attending: Dr.    Preoperative Diagnosis(es): Chronic migraine  Consent obtained from: The patient Benefits discussed included, but were not limited to decreased muscle tightness, increased joint range of motion, and decreased pain.  Risk discussed included, but were not limited pain and discomfort, bleeding, bruising, excessive weakness, venous thrombosis, muscle atrophy and dysphagia.  Anticipated outcomes of the procedure as well as he risks and benefits of the alternatives to the procedure, and the roles and tasks of the personnel to be involved, were discussed with the patient, and the patient consents to the procedure and agrees to proceed. A copy of the patient medication guide was given to the patient which explains the blackbox warning.  Patients identity and treatment sites confirmed Yes.  Details of Procedure: Skin was cleaned with alcohol. Prior to injection, the needle plunger was aspirated to make sure the needle was not within a blood vessel.  There was no blood retrieved on aspiration.    Following is a summary of the muscles injected  And the amount of Botulinum toxin used:  Dilution 200 units of Botox was reconstituted with 4 ml of preservative free normal saline. Time of reconstitution: At the time of the office visit (<30 minutes prior to injection)   Injections  155 total units of Botox was injected with a 30 gauge needle.  Injection Sites: L occipitalis: 15 units- 3 sites  R occiptalis: 15 units- 3 sites  L upper trapezius: 15 units- 3 sites R upper trapezius: 15 units- 3 sits          L paraspinal: 10 units- 2 sites R paraspinal: 10 units- 2 sites  Face L frontalis(2 injection sites):10 units   R frontalis(2 injection sites):10 units         L corrugator: 5 units   R corrugator: 5 units           Procerus: 5 units   L temporalis: 20 units R temporalis: 20 units   Agent:  200 units of botulinum Type A  (Onobotulinum Toxin type A) was reconstituted with 4 ml of preservative free normal saline.  Time of reconstitution: At the time of the office visit (<30 minutes prior to injection)     Total injected (Units): 155  Total wasted (Units): 7.5  Patient tolerated procedure well without complications.   Reinjection is anticipated in 3 months.    

## 2019-07-30 NOTE — Progress Notes (Signed)
7.5 units wasted

## 2019-08-02 NOTE — Progress Notes (Addendum)
Virtual Visit via Video Note The purpose of this virtual visit is to provide medical care while limiting exposure to the novel coronavirus.    Consent was obtained for video visit:  Yes Answered questions that patient had about telehealth interaction:  Yes I discussed the limitations, risks, security and privacy concerns of performing an evaluation and management service by telemedicine. I also discussed with the patient that there may be a patient responsible charge related to this service. The patient expressed understanding and agreed to proceed.  Pt location: Home Physician Location: Home Name of referring provider:  Ernestene Kiel, MD I connected with Paige [name] at patients initiation/request on 08/04/2019 at 10:10 AM EDT by video enabled telemedicine application and verified that I am speaking with the correct person using two identifiers. Pt MRN:  [id number] Pt DOB:  01/30/1966 Video Participants:  Paige [name];   History of Present Illness:  Paige [name] is a 53 year old right-handed female whom I also see for migraines, follows up for numbness and tingling in the toes.  For several months, she reports numbness and tingling in the toes.  TSH from 05/26/19 was 1.72.  NCV-EMG from 07/15/19 was normal.  B12 was performed and reportedly normal.  I don't have the result.  Still with burning and tingling in feet.   Current medications:  Gabapentin 800mg  twice daily; Cymbalta 90mg  daily;   Past Medical History: Past Medical History:  Diagnosis Date  . Allergic rhinitis   . Anemia   . Anxiety disorder   . Chronic fatigue   . Chronic headaches   . Chronic pain   . Depression   . Endometriosis   . Family history of colon cancer    mother,uncles,aunts  . Fibromyalgia   . Gallstones   . GERD (gastroesophageal reflux disease)   . Hypothyroidism   . IBS (irritable bowel syndrome)   . Insomnia   . Iron deficiency   . Laryngopharyngeal reflux (LPR)   .  Obesity   . Personal history of colonic polyps 04/2006   polypoid  . PONV (postoperative nausea and vomiting)   . RLS (restless legs syndrome)     Medications: Outpatient Encounter Medications as of 08/04/2019  Medication Sig  . buPROPion (WELLBUTRIN SR) 150 MG 12 hr tablet Take 150 mg by mouth daily. 2 tablets by mouth every morning and 1 tablet in afternoon  . cetirizine (ZYRTEC) 10 MG tablet Take 1 tablet (10 mg total) by mouth daily.  Marland Kitchen dicyclomine (BENTYL) 10 MG capsule Take 1 capsule (10 mg total) by mouth 3 (three) times daily as needed for spasms.  . DULoxetine (CYMBALTA) 30 MG capsule Take 90 mg by mouth daily.  . Ferrous Sulfate Dried (SLOW RELEASE IRON) 45 MG TBCR Take 1 tablet by mouth daily.  Marland Kitchen gabapentin (NEURONTIN) 300 MG capsule Take 300 mg by mouth 3 (three) times daily. Take 2 capsules in the morning and 1 capsule at bedtime  . ipratropium (ATROVENT) 0.03 % nasal spray Place 2 sprays into both nostrils as needed.   Marland Kitchen levothyroxine (SYNTHROID, LEVOTHROID) 88 MCG tablet Take 88 mcg by mouth daily before breakfast.  . nystatin cream (MYCOSTATIN) Apply 1 application topically daily as needed for dry skin.  Marland Kitchen olopatadine (PATANOL) 0.1 % ophthalmic solution Place 1 drop into both eyes daily as needed for allergies.   Marland Kitchen omeprazole (PRILOSEC) 40 MG capsule Take 1 capsule (40 mg total) by mouth daily.  . ondansetron (ZOFRAN-ODT) 4 MG disintegrating tablet Take 4  mg by mouth every 8 (eight) hours as needed for nausea or vomiting.  Vladimir Faster Glycol-Propyl Glycol (SYSTANE) 0.4-0.3 % SOLN Apply 1 drop to eye as needed.  . polyethylene glycol (MIRALAX / GLYCOLAX) 17 g packet Take 17 g by mouth 2 (two) times daily.  . potassium chloride (K-DUR,KLOR-CON) 10 MEQ tablet Take 1 tablet by mouth daily.  . promethazine (PHENERGAN) 12.5 MG tablet Take 1 tablet (12.5 mg total) by mouth every 8 (eight) hours as needed for nausea or vomiting.  . rizatriptan (MAXALT) 10 MG tablet Take 1 tablet  earliest onset of migraine.  May repeat once in 2 hours if needed  . rOPINIRole (REQUIP) 2 MG tablet Take 2 mg by mouth 2 (two) times daily.  Marland Kitchen terconazole (TERAZOL 3) 0.8 % vaginal cream Place 1 applicator vaginally at bedtime.  . torsemide (DEMADEX) 100 MG tablet Take 50 mg by mouth daily.   . traZODone (DESYREL) 100 MG tablet Take 200 mg by mouth at bedtime.   Facility-Administered Encounter Medications as of 08/04/2019  Medication  . 0.9 %  sodium chloride infusion    Allergies: Allergies  Allergen Reactions  . Oxycodone-Acetaminophen Hives  . Topamax [Topiramate] Shortness Of Breath and Other (See Comments)    Really wierd feeling Off balance; cloudy thinking Really wierd feeling  . Naltrexone Other (See Comments)  . Sumatriptan Other (See Comments)    Difficulty breathing  . Tape Dermatitis  . Flexeril [Cyclobenzaprine Hcl]   . Imitrex [Sumatriptan Base]   . Mobic [Meloxicam]   . Morphine And Related   . Percocet [Oxycodone-Acetaminophen]   . Pork (Porcine) Protein     Other reaction(s): Other (See Comments) Migraines    Family History: Family History  Problem Relation Age of Onset  . Colon cancer Mother        uncles,aunts  . Diabetes Father        paternal grandmother,sister  . Leukemia Father   . Lymphoma Father   . Skin cancer Father   . Heart disease Father   . Diabetes Sister   . Stomach cancer Neg Hx   . Esophageal cancer Neg Hx   . Rectal cancer Neg Hx     Social History: Social History   Socioeconomic History  . Marital status: Single    Spouse name: Not on file  . Number of children: 0  . Years of education: Not on file  . Highest education level: Not on file  Occupational History  . Occupation: RN/disabled  Social Needs  . Financial resource strain: Not on file  . Food insecurity    Worry: Not on file    Inability: Not on file  . Transportation needs    Medical: Not on file    Non-medical: Not on file  Tobacco Use  . Smoking status:  Never Smoker  . Smokeless tobacco: Never Used  Substance and Sexual Activity  . Alcohol use: Not Currently    Alcohol/week: 0.0 standard drinks    Comment: rarely  . Drug use: No  . Sexual activity: Not on file  Lifestyle  . Physical activity    Days per week: Not on file    Minutes per session: Not on file  . Stress: Not on file  Relationships  . Social Herbalist on phone: Not on file    Gets together: Not on file    Attends religious service: Not on file    Active member of club or organization: Not on file  Attends meetings of clubs or organizations: Not on file    Relationship status: Not on file  . Intimate partner violence    Fear of current or ex partner: Not on file    Emotionally abused: Not on file    Physically abused: Not on file    Forced sexual activity: Not on file  Other Topics Concern  . Not on file  Social History Narrative   Patient is left-handed. She lives alone in a one level home. She has 3 cats. She is unable to exercise.    Observations/Objective:   Height 5\' 9"  (1.753 m), weight 215 lb (97.5 kg). No acute distress.  Alert and oriented.  Speech fluent and not dysarthric.  Language intact.  Eyes orthophoric on primary gaze.  Face symmetric.  Assessment and Plan:   Neuropathy, consider small fiber neuropathy  1.  Titrate gabapentin up to 800mg  three times daily  2.  Check blood work for ANA with reflex, sed rate, SPEP/IFE, B6 3.  Consider punch skin biopsy if labs unremarkable.  Follow Up Instructions:    -I discussed the assessment and treatment plan with the patient. The patient was provided an opportunity to ask questions and all were answered. The patient agreed with the plan and demonstrated an understanding of the instructions.   The patient was advised to call back or seek an in-person evaluation if the symptoms worsen or if the condition fails to improve as anticipated.    Total Time spent in visit with the patient was:   15 minutes  Dudley Major, DO   10/26/2019 ADDENDUM: Underwent further neuropathy workup. 09/06/2019 Labs:  ANA negative; sed rate 19; SPEP/IFE negative for M-spike; B12 401; B6 6.2 10/12/2019 Punch Skin Biopsy:  Negative for small fiber neuropathy but demonstrated some degenerative changes within epidermal nerves which are non-specific and may be normal but also may be predictive of future development of clinical neuropathy.  Should symptoms persist, we can repeat biopsy in 6 to 12 months. Metta Clines, DO

## 2019-08-03 ENCOUNTER — Other Ambulatory Visit: Payer: Self-pay

## 2019-08-03 ENCOUNTER — Encounter: Payer: Self-pay | Admitting: Adult Health

## 2019-08-03 ENCOUNTER — Ambulatory Visit (INDEPENDENT_AMBULATORY_CARE_PROVIDER_SITE_OTHER): Payer: PPO | Admitting: Adult Health

## 2019-08-03 VITALS — BP 117/80 | HR 87 | Ht 69.0 in | Wt 215.0 lb

## 2019-08-03 DIAGNOSIS — F4310 Post-traumatic stress disorder, unspecified: Secondary | ICD-10-CM

## 2019-08-03 DIAGNOSIS — G4700 Insomnia, unspecified: Secondary | ICD-10-CM

## 2019-08-03 DIAGNOSIS — F411 Generalized anxiety disorder: Secondary | ICD-10-CM | POA: Diagnosis not present

## 2019-08-03 DIAGNOSIS — F331 Major depressive disorder, recurrent, moderate: Secondary | ICD-10-CM
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
## 2019-08-04 ENCOUNTER — Other Ambulatory Visit: Payer: Self-pay

## 2019-08-04 ENCOUNTER — Telehealth: Payer: Self-pay | Admitting: Neurology

## 2019-08-04 ENCOUNTER — Encounter: Payer: Self-pay | Admitting: Neurology

## 2019-08-04 ENCOUNTER — Ambulatory Visit (INDEPENDENT_AMBULATORY_CARE_PROVIDER_SITE_OTHER): Payer: PPO | Admitting: Psychology

## 2019-08-04 ENCOUNTER — Telehealth (INDEPENDENT_AMBULATORY_CARE_PROVIDER_SITE_OTHER): Payer: PPO | Admitting: Neurology

## 2019-08-04 VITALS — Ht 69.0 in | Wt 215.0 lb

## 2019-08-04 DIAGNOSIS — G43709 Chronic migraine without aura, not intractable, without status migrainosus: Secondary | ICD-10-CM

## 2019-08-04 DIAGNOSIS — Z1321 Encounter for screening for nutritional disorder: Secondary | ICD-10-CM

## 2019-08-04 DIAGNOSIS — F4310 Post-traumatic stress disorder, unspecified: Secondary | ICD-10-CM

## 2019-08-04 DIAGNOSIS — G629 Polyneuropathy, unspecified: Secondary | ICD-10-CM

## 2019-08-04 DIAGNOSIS — R200 Anesthesia of skin: Secondary | ICD-10-CM

## 2019-08-04 DIAGNOSIS — R202 Paresthesia of skin: Secondary | ICD-10-CM

## 2019-08-04 DIAGNOSIS — IMO0002 Reserved for concepts with insufficient information to code with codable children: Secondary | ICD-10-CM

## 2019-08-04 MED ORDER — GABAPENTIN 800 MG PO TABS: 800.0000 mg | ORAL_TABLET | Freq: Three times a day (TID) | ORAL | 3 refills | Status: DC

## 2019-08-04 NOTE — Telephone Encounter (Signed)
Patient called and left a voice mail requesting a call back from Dr. Georgie Chard assistant to review some lab results.

## 2019-08-04 NOTE — Telephone Encounter (Signed)
Noted  

## 2019-08-04 NOTE — Telephone Encounter (Signed)
b12 level 401 Protein level 6.4 Albumin 4.2 bilburin 0.4 bilburin direct 0.13 Alkaline 107 Sgot 31 Sgpt 47       For your review

## 2019-08-05 DIAGNOSIS — M4716 Other spondylosis with myelopathy, lumbar region: Secondary | ICD-10-CM | POA: Diagnosis not present

## 2019-08-05 DIAGNOSIS — M5416 Radiculopathy, lumbar region: Secondary | ICD-10-CM | POA: Diagnosis not present

## 2019-08-05 NOTE — Addendum Note (Signed)
Addended by: Jesse Fall on: 08/05/2019 10:40 AM   Modules accepted: Orders

## 2019-08-05 NOTE — Telephone Encounter (Addendum)
Faxed orders per patient request to her PCP Dr. Laqueta Due for labwork ordered yesterday at televisit with Dr. Tomi Likens. Faxed to [phone]. Confirmation fax received.  Left patient a voice mail stating above. Had called PCP office at [phone] and they said they would call patient for appt.

## 2019-08-05 NOTE — Addendum Note (Signed)
Addended by: Jesse Fall on: 08/05/2019 09:48 AM   Modules accepted: Orders

## 2019-08-11 ENCOUNTER — Ambulatory Visit (INDEPENDENT_AMBULATORY_CARE_PROVIDER_SITE_OTHER): Payer: PPO | Admitting: Psychology

## 2019-08-11 DIAGNOSIS — E039 Hypothyroidism, unspecified: Secondary | ICD-10-CM | POA: Diagnosis not present

## 2019-08-11 DIAGNOSIS — R5382 Chronic fatigue, unspecified: Secondary | ICD-10-CM | POA: Diagnosis not present

## 2019-08-11 DIAGNOSIS — F4310 Post-traumatic stress disorder, unspecified: Secondary | ICD-10-CM | POA: Diagnosis not present

## 2019-08-11 DIAGNOSIS — G2581 Restless legs syndrome: Secondary | ICD-10-CM | POA: Diagnosis not present

## 2019-08-11 DIAGNOSIS — Z6833 Body mass index (BMI) 33.0-33.9, adult: Secondary | ICD-10-CM | POA: Diagnosis not present

## 2019-08-11 DIAGNOSIS — Z1231 Encounter for screening mammogram for malignant neoplasm of breast: Secondary | ICD-10-CM | POA: Diagnosis not present

## 2019-08-11 DIAGNOSIS — M549 Dorsalgia, unspecified: Secondary | ICD-10-CM | POA: Diagnosis not present

## 2019-08-11 DIAGNOSIS — I890 Lymphedema, not elsewhere classified: Secondary | ICD-10-CM | POA: Diagnosis not present

## 2019-08-11 DIAGNOSIS — G43009 Migraine without aura, not intractable, without status migrainosus: Secondary | ICD-10-CM | POA: Diagnosis not present

## 2019-08-11 DIAGNOSIS — F322 Major depressive disorder, single episode, severe without psychotic features: Secondary | ICD-10-CM | POA: Diagnosis not present

## 2019-08-11 DIAGNOSIS — B372 Candidiasis of skin and nail: Secondary | ICD-10-CM | POA: Diagnosis not present

## 2019-08-11 DIAGNOSIS — M797 Fibromyalgia: Secondary | ICD-10-CM | POA: Diagnosis not present

## 2019-08-13 DIAGNOSIS — R062 Wheezing: Secondary | ICD-10-CM | POA: Diagnosis not present

## 2019-08-16 DIAGNOSIS — M47816 Spondylosis without myelopathy or radiculopathy, lumbar region: Secondary | ICD-10-CM | POA: Diagnosis not present

## 2019-08-18 ENCOUNTER — Ambulatory Visit (INDEPENDENT_AMBULATORY_CARE_PROVIDER_SITE_OTHER): Payer: PPO | Admitting: Psychology

## 2019-08-18 DIAGNOSIS — G4733 Obstructive sleep apnea (adult) (pediatric): Secondary | ICD-10-CM | POA: Diagnosis not present

## 2019-08-18 DIAGNOSIS — F4310 Post-traumatic stress disorder, unspecified: Secondary | ICD-10-CM | POA: Diagnosis not present

## 2019-08-18 DIAGNOSIS — R062 Wheezing: Secondary | ICD-10-CM | POA: Diagnosis not present

## 2019-08-19 DIAGNOSIS — G43009 Migraine without aura, not intractable, without status migrainosus: Secondary | ICD-10-CM | POA: Diagnosis not present

## 2019-08-19 DIAGNOSIS — G4733 Obstructive sleep apnea (adult) (pediatric): Secondary | ICD-10-CM | POA: Diagnosis not present

## 2019-08-20 DIAGNOSIS — Z6833 Body mass index (BMI) 33.0-33.9, adult: Secondary | ICD-10-CM | POA: Diagnosis not present

## 2019-08-20 DIAGNOSIS — H9203 Otalgia, bilateral: Secondary | ICD-10-CM | POA: Diagnosis not present

## 2019-08-24 ENCOUNTER — Ambulatory Visit (INDEPENDENT_AMBULATORY_CARE_PROVIDER_SITE_OTHER): Payer: PPO | Admitting: Adult Health

## 2019-08-24 ENCOUNTER — Encounter: Payer: Self-pay | Admitting: Adult Health

## 2019-08-24 ENCOUNTER — Other Ambulatory Visit: Payer: Self-pay

## 2019-08-24 DIAGNOSIS — F411 Generalized anxiety disorder: Secondary | ICD-10-CM | POA: Diagnosis not present

## 2019-08-24 DIAGNOSIS — F4310 Post-traumatic stress disorder, unspecified: Secondary | ICD-10-CM | POA: Diagnosis not present

## 2019-08-24 DIAGNOSIS — G4700 Insomnia, unspecified: Secondary | ICD-10-CM | POA: Diagnosis not present

## 2019-08-24 DIAGNOSIS — F331 Major depressive disorder, recurrent, moderate: Secondary | ICD-10-CM | POA: Diagnosis not present

## 2019-08-24 MED ORDER — DULOXETINE HCL 60 MG PO CPEP: 60.0000 mg | ORAL_CAPSULE | Freq: Two times a day (BID) | ORAL | 2 refills | Status: DC

## 2019-08-24 NOTE — Progress Notes (Signed)
[facility] MD/PA/NP Medication Check  08/24/2019 10:07 AM Paige [name]  MRN:  [id number]  Chief Complaint:  Depression, anxiety, insomnia, PTSD  Referred by: Bambi Cottle   HPI:   Describes mood today as "ok". Pleasant. Flat. Mood symptoms - reports depression, anxiety, and irritability. Stating "nothing is any better", "I don't see any changes". Recently started on prednisone and "can't tell how she is doing". Not crying as much, but has had a "few crying spells". Chronic pain with Fibromyalgia - started on prednisone 3 days ago. Stable interest and motivation. Continues to see therapist every week for PTSD. Taking medications as prescribed.  Energy levels "pretty low". Active, unable to establish a regular exercise routine with current disabilities.  Enjoys some usual interests and activities. Lives alone. Has 3 cats. Spending time with family - mother. Lives 3 miles away. Talking to friends. Appetite adequate. Trying to get off of sugar. Has stopped drinking sweet tea and candy. Eating breads. Trying to reduce carbs. Weight increased - 221. Goal weight 184.  Sleeps well most nights. Averages 3 hours most nights. Diagnosed with sleep apnea a few months ago. Sleeping "off and on" with CPAP machine. Slept 7 hours last night - had been getting a few hours "here and there". Focus and concentration difficulties - "it's awful". Stating I can't get anything done. Not able to keep house clean - "I'm trying". Has unfinished projects all over the house. Has hired someone to help her clean. .Completing some tasks. Diagnosed with cognitive issues.   Denies SI or HI. Denies AH or VH.  Previous medication trials: Lexapro, Paxil, Trazadone, Wellbutrin SR, Geodon, Seroquel, Risperdal, Depakote, Topamax, Lithium, Provigil, Ambien, Cytomel, Clonazepam and other benzodixepaines, Elavil, Effexor, remeron, Prozac, Anafranil, Tofranil, Zoloft, Geodon, Buspar, Adderall, Ritalin, Abilify, Rexulti,  Lamictal  Other treatments: ECT - 18 treatments - significant memory loss.   Visit Diagnosis:    ICD-10-CM   1. Generalized anxiety disorder  F41.1 DULoxetine (CYMBALTA) 60 MG capsule  2. PTSD (post-traumatic stress disorder)  F43.10 DULoxetine (CYMBALTA) 60 MG capsule  3. Major depressive disorder, recurrent episode, moderate (HCC)  F33.1 DULoxetine (CYMBALTA) 60 MG capsule  4. Insomnia, unspecified type  G47.00 DULoxetine (CYMBALTA) 60 MG capsule    Past Psychiatric History: Admitted 3 times - Has been hospitalized 3 times. First time was in [place] - depressed and suicidal. Had 18 ECT treatments. Hospitalized at [facility]. And third time with intensive outpatient.   Past Medical History:  Past Medical History:  Diagnosis Date  . Allergic rhinitis   . Anemia   . Anxiety disorder   . Chronic fatigue   . Chronic headaches   . Chronic pain   . Depression   . Endometriosis   . Family history of colon cancer    mother,uncles,aunts  . Fibromyalgia   . Gallstones   . GERD (gastroesophageal reflux disease)   . Hypothyroidism   . IBS (irritable bowel syndrome)   . Insomnia   . Iron deficiency   . Laryngopharyngeal reflux (LPR)   . Obesity   . Personal history of colonic polyps 04/2006   polypoid  . PONV (postoperative nausea and vomiting)   . RLS (restless legs syndrome)     Past Surgical History:  Procedure Laterality Date  . ANTERIOR CRUCIATE LIGAMENT REPAIR Left 2003  . APPENDECTOMY  1993  . CARPAL TUNNEL RELEASE Left 09/25/2017   Procedure: LEFT CARPAL TUNNEL RELEASE;  Surgeon: Daryll Brod, MD;  Location: [facility];  Service: Orthopedics;  Laterality: Left;  . CHOLECYSTECTOMY  1999  . COLONOSCOPY  2009   [facility]  . REPAIR ANKLE LIGAMENT Left 2001  . ROUX-EN-Y GASTRIC BYPASS  04/2004   Dr Johnathan Hausen  . TONSILLECTOMY AND ADENOIDECTOMY  1976    Family Psychiatric History: Denies  Family History:  Family History  Problem Relation Age of  Onset  . Colon cancer Mother        uncles,aunts  . Diabetes Father        paternal grandmother,sister  . Leukemia Father   . Lymphoma Father   . Skin cancer Father   . Heart disease Father   . Diabetes Sister   . Stomach cancer Neg Hx   . Esophageal cancer Neg Hx   . Rectal cancer Neg Hx     Social History:  Social History   Socioeconomic History  . Marital status: Single    Spouse name: Not on file  . Number of children: 0  . Years of education: Not on file  . Highest education level: Not on file  Occupational History  . Occupation: RN/disabled  Social Needs  . Financial resource strain: Not on file  . Food insecurity    Worry: Not on file    Inability: Not on file  . Transportation needs    Medical: Not on file    Non-medical: Not on file  Tobacco Use  . Smoking status: Never Smoker  . Smokeless tobacco: Never Used  Substance and Sexual Activity  . Alcohol use: Not Currently    Alcohol/week: 0.0 standard drinks    Comment: rarely  . Drug use: No  . Sexual activity: Not on file  Lifestyle  . Physical activity    Days per week: Not on file    Minutes per session: Not on file  . Stress: Not on file  Relationships  . Social Herbalist on phone: Not on file    Gets together: Not on file    Attends religious service: Not on file    Active member of club or organization: Not on file    Attends meetings of clubs or organizations: Not on file    Relationship status: Not on file  Other Topics Concern  . Not on file  Social History Narrative   Patient is left-handed. She lives alone in a one level home. She has 3 cats. She is unable to exercise. Caffeine one soda / day. On disability now. 2 bachelor degrees    Allergies:  Allergies  Allergen Reactions  . Oxycodone-Acetaminophen Hives  . Topiramate Shortness Of Breath and Other (See Comments)    Really wierd feeling Off balance; cloudy thinking Really wierd feeling  . Cyclobenzaprine Other (See  Comments)    Other reaction(s): Unknown Hallucinations Unknown   . Meloxicam Other (See Comments)  . Morphine     Other reaction(s): Unknown   . Naltrexone Other (See Comments)  . Pork (Porcine) Protein Other (See Comments)    Other reaction(s): Other (See Comments) Migraines Other reaction(s): Other (See Comments) Migraines  . Sumatriptan Other (See Comments)    Difficulty breathing Other reaction(s): Other (see comments) Off balance   . Tape Dermatitis  . Flexeril [Cyclobenzaprine Hcl]   . Imitrex [Sumatriptan Base]   . Morphine And Related   . Oxycodone Hcl   . Percocet [Oxycodone-Acetaminophen]     Metabolic Disorder Labs: No results found for: HGBA1C, MPG No results found for: PROLACTIN No results found for: CHOL, TRIG, HDL, CHOLHDL,  VLDL, LDLCALC Lab Results  Component Value Date   TSH 1.72 05/26/2019    Therapeutic Level Labs: No results found for: LITHIUM No results found for: VALPROATE No components found for:  CBMZ  Current Medications: Current Outpatient Medications  Medication Sig Dispense Refill  . Brexpiprazole (REXULTI) 0.5 MG TABS Take by mouth daily. 9/1 starting 0.5 tabs and increasing to 1 mg next week    . buPROPion (WELLBUTRIN SR) 150 MG 12 hr tablet Take 150 mg by mouth daily. Wellbutrin 150XL one tab daily    . cetirizine (ZYRTEC) 10 MG tablet Take 1 tablet (10 mg total) by mouth daily. 1 tablet 0  . dicyclomine (BENTYL) 10 MG capsule Take 1 capsule (10 mg total) by mouth 3 (three) times daily as needed for spasms. 90 capsule 11  . DULoxetine (CYMBALTA) 60 MG capsule Take 1 capsule (60 mg total) by mouth 2 (two) times daily. 60 capsule 2  . Ferrous Sulfate Dried (SLOW RELEASE IRON) 45 MG TBCR Take 1 tablet by mouth daily.    Marland Kitchen gabapentin (NEURONTIN) 800 MG tablet Take 1 tablet (800 mg total) by mouth 3 (three) times daily. 90 tablet 3  . ipratropium (ATROVENT) 0.03 % nasal spray Place 2 sprays into both nostrils as needed.  30 mL 12  .  levothyroxine (SYNTHROID, LEVOTHROID) 88 MCG tablet Take 88 mcg by mouth daily before breakfast.    . nystatin cream (MYCOSTATIN) Apply 1 application topically daily as needed for dry skin.    Marland Kitchen olopatadine (PATANOL) 0.1 % ophthalmic solution Place 1 drop into both eyes daily as needed for allergies.     Marland Kitchen omeprazole (PRILOSEC) 40 MG capsule Take 1 capsule (40 mg total) by mouth daily. (Patient taking differently: Take 20 mg by mouth daily. 20mg  2x/day) 90 capsule 3  . ondansetron (ZOFRAN-ODT) 4 MG disintegrating tablet Take 4 mg by mouth every 8 (eight) hours as needed for nausea or vomiting.    Vladimir Faster Glycol-Propyl Glycol (SYSTANE) 0.4-0.3 % SOLN Apply 1 drop to eye as needed.    . polyethylene glycol (MIRALAX / GLYCOLAX) 17 g packet Take 17 g by mouth 2 (two) times daily.    . potassium chloride (K-DUR,KLOR-CON) 10 MEQ tablet Take 1 tablet by mouth daily.    . promethazine (PHENERGAN) 12.5 MG tablet Take 1 tablet (12.5 mg total) by mouth every 8 (eight) hours as needed for nausea or vomiting. 30 tablet 0  . rizatriptan (MAXALT) 10 MG tablet Take 1 tablet earliest onset of migraine.  May repeat once in 2 hours if needed 10 tablet 3  . rOPINIRole (REQUIP) 2 MG tablet Take 2 mg by mouth 2 (two) times daily.    Marland Kitchen terconazole (TERAZOL 3) 0.8 % vaginal cream Place 1 applicator vaginally at bedtime.    . torsemide (DEMADEX) 100 MG tablet Take 50 mg by mouth daily.     . traZODone (DESYREL) 100 MG tablet Take 200 mg by mouth at bedtime.     Current Facility-Administered Medications  Medication Dose Route Frequency Provider Last Rate Last Dose  . 0.9 %  sodium chloride infusion  500 mL Intravenous Once Ladene Artist, MD        Medication Side Effects: none  Orders placed this visit:  No orders of the defined types were placed in this encounter.   Psychiatric Specialty Exam:  ROS  There were no vitals taken for this visit.There is no height or weight on file to calculate BMI.  General  Appearance:  Neat and Well Groomed  Eye Contact:  Good  Speech:  Clear and Coherent  Volume:  Normal  Mood:  Anxious, Depressed and Irritable  Affect:  Congruent  Thought Process:  Coherent  Orientation:  Full (Time, Place, and Person)  Thought Content: Logical   Suicidal Thoughts:  No  Homicidal Thoughts:  No  Memory:  WNL  Judgement:  Good  Insight:  Good  Psychomotor Activity:  Normal  Concentration:  Concentration: Fair  Recall:  Poor  Fund of Knowledge: Good  Language: Good  Assets:  Communication Skills Desire for Improvement Financial Resources/Insurance Housing Intimacy Leisure Time Physical Health Resilience Social Support Talents/Skills Transportation Vocational/Educational  ADL's:  Intact  Cognition: WNL  Prognosis:  Fair   Screenings:  Mini-Mental     Office Visit from 04/07/2018 in [facility]  Total Score (max 30 points )  29    PHQ2-9     Patient Outreach from 05/22/2018 in [facility] Patient Outreach from 05/13/2018 in [facility] Outreach Telephone from 04/30/2018 in [facility]  PHQ-2 Total Score  5  5  2   PHQ-9 Total Score  20  18  13       Receiving Psychotherapy: Yes   Treatment Plan/Recommendations:   Plan:  1. Wellbutrin SR 150mg  in the am  2. Cymbalta 30mg  - 3 capsules daily to 60mg  BID 3. Trazadone 100mg  - 2 at hs  4. Stopped Rexulti - TD started with 0.5mg  dose - symptoms have resolved.  Continue therapy  Request previous notes from [facility].   RTC 3 weeks  Patient advised to contact office with any questions, adverse effects, or acute worsening in signs and symptoms.  Discussed potential metabolic side effects associated with atypical antipsychotics, as well as potential risk for movement side effects. Advised pt to contact office if movement side effects occur.    Aloha Gell, NP

## 2019-08-25 ENCOUNTER — Ambulatory Visit (INDEPENDENT_AMBULATORY_CARE_PROVIDER_SITE_OTHER): Payer: PPO | Admitting: Psychology

## 2019-08-25 DIAGNOSIS — M654 Radial styloid tenosynovitis [de Quervain]: Secondary | ICD-10-CM | POA: Diagnosis not present

## 2019-08-25 DIAGNOSIS — M79642 Pain in left hand: Secondary | ICD-10-CM | POA: Diagnosis not present

## 2019-08-25 DIAGNOSIS — F4310 Post-traumatic stress disorder, unspecified: Secondary | ICD-10-CM

## 2019-09-01 ENCOUNTER — Ambulatory Visit (INDEPENDENT_AMBULATORY_CARE_PROVIDER_SITE_OTHER): Payer: PPO | Admitting: Psychology

## 2019-09-01 DIAGNOSIS — F4310 Post-traumatic stress disorder, unspecified: Secondary | ICD-10-CM

## 2019-09-01 DIAGNOSIS — R110 Nausea: Secondary | ICD-10-CM | POA: Diagnosis not present

## 2019-09-01 DIAGNOSIS — G43109 Migraine with aura, not intractable, without status migrainosus: Secondary | ICD-10-CM | POA: Diagnosis not present

## 2019-09-02 ENCOUNTER — Telehealth: Payer: Self-pay | Admitting: Neurology

## 2019-09-02 NOTE — Telephone Encounter (Signed)
Patient stated she had to go to the urgent care for a shot for her migraine. She was wanting to speak with a nurse about this. Please call her back. Thanks!

## 2019-09-02 NOTE — Telephone Encounter (Signed)
If she has a driver, she may come in for a headache cocktail.  If she comes in for a headache cocktail, we can provide her samples of Ubrelvy (take 100mg .  May repeat dose after 2 hours as needed.  Maximum 200mg  in 24 hours).  If effective, we can prescribe it.    If she is not able to come to the office, I can prescribe her a prednisone taper (10mg  tablets):  60mg  on day 1, 50mg  on day 2, 40mg  on day 3, 30mg  on day 4, 20mg  on day 5, 10mg  on day 6, then STOP

## 2019-09-02 NOTE — Telephone Encounter (Signed)
Called pt stated- can get someone to drive her at the office tomorrow. Sent to front office make appointment for cocktail

## 2019-09-02 NOTE — Telephone Encounter (Signed)
Called pt stated--Urgent care prescribe Toradol shot and phenergan for nausea last night and it help. But today the headache came back with the symptoms of blurry vision, light headed, off balance. Also, pt decrease Rx Neurotin 800mg  to 400mg  BID --is not helping. Please advise.

## 2019-09-03 ENCOUNTER — Ambulatory Visit (INDEPENDENT_AMBULATORY_CARE_PROVIDER_SITE_OTHER): Payer: PPO | Admitting: *Deleted

## 2019-09-03 ENCOUNTER — Other Ambulatory Visit: Payer: Self-pay

## 2019-09-03 ENCOUNTER — Other Ambulatory Visit: Payer: Self-pay | Admitting: *Deleted

## 2019-09-03 ENCOUNTER — Telehealth: Payer: Self-pay | Admitting: *Deleted

## 2019-09-03 DIAGNOSIS — G43009 Migraine without aura, not intractable, without status migrainosus: Secondary | ICD-10-CM

## 2019-09-03 DIAGNOSIS — G43709 Chronic migraine without aura, not intractable, without status migrainosus: Secondary | ICD-10-CM | POA: Diagnosis not present

## 2019-09-03 DIAGNOSIS — IMO0002 Reserved for concepts with insufficient information to code with codable children: Secondary | ICD-10-CM

## 2019-09-03 MED ORDER — METOCLOPRAMIDE HCL 5 MG/ML IJ SOLN
10.0000 mg | Freq: Once | INTRAMUSCULAR | Status: AC
  Administered 2019-09-03: 10 mg via INTRAMUSCULAR

## 2019-09-03 MED ORDER — DIPHENHYDRAMINE HCL 50 MG/ML IJ SOLN
25.0000 mg | Freq: Once | INTRAMUSCULAR | Status: AC
  Administered 2019-09-03: 10:00:00 25 mg via INTRAMUSCULAR

## 2019-09-03 MED ORDER — UBRELVY 50 MG PO TABS: 50.0000 mg | ORAL_TABLET | Freq: Once | ORAL | 0 refills | Status: AC

## 2019-09-03 MED ORDER — KETOROLAC TROMETHAMINE 60 MG/2ML IM SOLN
60.0000 mg | Freq: Once | INTRAMUSCULAR | Status: AC
  Administered 2019-09-03: 10:00:00 60 mg via INTRAMUSCULAR

## 2019-09-03 MED ORDER — UBRELVY 100 MG PO TABS: 100.0000 mg | ORAL_TABLET | Freq: Once | ORAL | 0 refills | Status: AC

## 2019-09-03 NOTE — Progress Notes (Signed)
Order received from Dr. Tomi Likens for migraine cocktail for patient (Benadryl 25 mg , Toradol 60 mg, Reglan 10 mg ) IM.  Noted in chart patient has received all of these medications prior on 04/30/17.  Patient also had toradrol earlier this week at urgent care.   Samples to be provided to patient for Ubrelvy 100mg  with instructions to take at earliest sign on migraine and may repeat two hours later if needed (no more than 200mg  in 24 hours).

## 2019-09-03 NOTE — Progress Notes (Addendum)
Samples provided to patient of Roselyn Meier (see prior note from 0813 this am with details). Patient was able to verbalize and repeat back instructions for taking and will call us and let us know if it works and MD will send in script at that time.  Regarding migraine cocktail (Benadryl 25 mg, Toradol 60 mg, Reglan 10mg ) IM patient has had before and tolerated well. Allergies reviewed - patient does not tolerate adhesive well so requested no bandaid. Injection sites covered with 2x2 gauze and looked unremarkable. As total volume of all meds combined is 57ml the shots were divided into two injections in RUQ and LUQ buttocks with 2.5 ml each location. On patient's RUQ was a small bruise from where patient received injection at Urgent Care a few days ago. Injected approximately an inch to the right of the bruise (patient aware of prior bruise).  Her mom was her driver home.

## 2019-09-03 NOTE — Telephone Encounter (Addendum)
Neuropathy labs MD ordered at 9/2 were all entered in computer and patient wanted to have them drawn at her PCP office Dr. Laqueta Due (see my note Sept 4 the orders were printed/faxed/office called/orders received and they said they would call patient for appt).  Patient thinks she has been there to have labs drawn this past month - that office is closed this afternoon so I will call Monday to have results faxed here.   States she is feeling much better this afternoon after the migraine cocktail this am.   Regarding stopping Botox and trying Aimovig patient is interested in doing this  - Dr. Tomi Likens will you please put in script for [place] (pharmacy on file correct [facility]).  I will have Georgeanna Lea start the PA once script/dose entered. Thanks.

## 2019-09-03 NOTE — Telephone Encounter (Signed)
Recommend stopping Botox and instead try Aimovig.  It is a self-injection (usually in thigh or abdomen) every 28 days.  Typically well tolerated (injection site reaction or constipation possible).  I also found it effective for migraine prevention.  I had ordered neuropathy labs.  Have they been done?  I want to wait for those results before starting a different medication for nerve pain.

## 2019-09-03 NOTE — Telephone Encounter (Signed)
Patient was in office for migraine cocktail (see prior documentation)  She wanted me to inform Dr Tomi Likens of two things:  1. The Botox has not seemed to have been as effective the past few times as it usually is (patient aware of next appt 12/4 for Botox)  2. On 9/2 Dr. Tomi Likens recommended patient increase her Gabapentin 800mg  from BID to TID. Patient stated that she did not increase the frequency as she had fallen a few times and another MD recommended she decrease the amount she was taking. She is now taking 400mg  BID and wanted to let Dr. Tomi Likens know and see if there is another medication she could try instead as she doesn't feel she can tolerate the Gabapentin at the higher dose.  Informed patient will make Dr. Tomi Likens aware of these two items and call her back. Verbalized understanding.

## 2019-09-06 ENCOUNTER — Encounter: Payer: Self-pay | Admitting: *Deleted

## 2019-09-06 ENCOUNTER — Other Ambulatory Visit: Payer: Self-pay | Admitting: Neurology

## 2019-09-06 DIAGNOSIS — G629 Polyneuropathy, unspecified: Secondary | ICD-10-CM | POA: Diagnosis not present

## 2019-09-06 DIAGNOSIS — G43709 Chronic migraine without aura, not intractable, without status migrainosus: Secondary | ICD-10-CM | POA: Diagnosis not present

## 2019-09-06 DIAGNOSIS — R202 Paresthesia of skin: Secondary | ICD-10-CM | POA: Diagnosis not present

## 2019-09-06 DIAGNOSIS — Z1321 Encounter for screening for nutritional disorder: Secondary | ICD-10-CM | POA: Diagnosis not present

## 2019-09-06 DIAGNOSIS — R200 Anesthesia of skin: Secondary | ICD-10-CM | POA: Diagnosis not present

## 2019-09-06 MED ORDER — AIMOVIG 70 MG/ML ~~LOC~~ SOAJ: 70.0000 mg | SUBCUTANEOUS | 11 refills | Status: DC

## 2019-09-06 NOTE — Progress Notes (Addendum)
Paige [name] (Key: Ruthine Dose) Aimovig 70MG /ML auto-injectors   Form [employer] Medicare 4-Part NCPDP Electronic PA Form Created 2 days ago Sent to Plan 2 days ago Plan Response 2 days ago Submit Clinical Questions 2 days ago Determination Favorable 18 hours ago Message from Plan PA Case: [id number], Status: Approved, Coverage Starts on: 09/07/2019 12:00:00 AM, Coverage Ends on: 12/02/2019 12:00:00 AM.

## 2019-09-06 NOTE — Telephone Encounter (Signed)
I have placed order for Aimovig 70mg  injection every 28 days (sent to [facility])

## 2019-09-06 NOTE — Telephone Encounter (Addendum)
Cancelled Dec appt for Botox as PA for Aimovig will be started Paige [name] R to do). Patient aware of this  . Called PCP office Dr. Laqueta Due and they have all the orders I placed on 9/3 (ANA, IFA, Sed Rate, Vit B6, Protein electophoresis, Protein total IFE, 24) and Katharine Look stated patient has not come to PCP to have drawn yet.  Called patient and made her aware her PCP has all the orders for the labs Dr. Tomi Likens wants. She is to call them today and go get labs drawn. Spoke with Katharine Look at Dr. Felipa Emory office and she has our office fax and will fax results when they are back.

## 2019-09-07 DIAGNOSIS — T148XXA Other injury of unspecified body region, initial encounter: Secondary | ICD-10-CM | POA: Diagnosis not present

## 2019-09-07 DIAGNOSIS — G43901 Migraine, unspecified, not intractable, with status migrainosus: Secondary | ICD-10-CM | POA: Diagnosis not present

## 2019-09-07 DIAGNOSIS — Z6834 Body mass index (BMI) 34.0-34.9, adult: Secondary | ICD-10-CM | POA: Diagnosis not present

## 2019-09-08 ENCOUNTER — Ambulatory Visit (INDEPENDENT_AMBULATORY_CARE_PROVIDER_SITE_OTHER): Payer: PPO | Admitting: Psychology

## 2019-09-08 DIAGNOSIS — F4310 Post-traumatic stress disorder, unspecified: Secondary | ICD-10-CM

## 2019-09-12 DIAGNOSIS — R062 Wheezing: Secondary | ICD-10-CM | POA: Diagnosis not present

## 2019-09-13 DIAGNOSIS — M5416 Radiculopathy, lumbar region: Secondary | ICD-10-CM | POA: Diagnosis not present

## 2019-09-15 ENCOUNTER — Ambulatory Visit (INDEPENDENT_AMBULATORY_CARE_PROVIDER_SITE_OTHER): Payer: PPO | Admitting: Psychology

## 2019-09-15 DIAGNOSIS — F4310 Post-traumatic stress disorder, unspecified: Secondary | ICD-10-CM

## 2019-09-17 ENCOUNTER — Telehealth: Payer: Self-pay | Admitting: Neurology

## 2019-09-17 DIAGNOSIS — R062 Wheezing: Secondary | ICD-10-CM | POA: Diagnosis not present

## 2019-09-17 DIAGNOSIS — G629 Polyneuropathy, unspecified: Secondary | ICD-10-CM

## 2019-09-17 DIAGNOSIS — G4733 Obstructive sleep apnea (adult) (pediatric): Secondary | ICD-10-CM | POA: Diagnosis not present

## 2019-09-17 NOTE — Telephone Encounter (Signed)
Patient is calling in about blood work results that were normal and she was wanting to find out next steps and what does Dr. Tomi [name] want to do next. Thanks!

## 2019-09-18 DIAGNOSIS — G43009 Migraine without aura, not intractable, without status migrainosus: Secondary | ICD-10-CM | POA: Diagnosis not present

## 2019-09-18 DIAGNOSIS — G4733 Obstructive sleep apnea (adult) (pediatric): Secondary | ICD-10-CM | POA: Diagnosis not present

## 2019-09-20 NOTE — Telephone Encounter (Signed)
Patient is calling to speak to someone about the blood work results. She also needs to have the [place] called into the [facility] in [place]

## 2019-09-20 NOTE — Telephone Encounter (Signed)
Spoke with patient she was made aware of provider response. Will ask Dr. Posey Pronto about punch skin biopsy.  Patient states that she stop the gabapentin on her own and is doing better without it.  She had received samples of Ubrevly and is requesting a Rx to be sent to pharmacy. Ok to send?

## 2019-09-20 NOTE — Telephone Encounter (Signed)
See below

## 2019-09-20 NOTE — Telephone Encounter (Signed)
Per Dr. Posey Pronto Send referral to podiatry for punch skin biopsy. Pt lives in [place] Internal referral sent to [facility] location

## 2019-09-20 NOTE — Telephone Encounter (Signed)
We can send her for a punch skin biopsy (they take a small biopsy of her skin in her lower leg).  Podiatrists often perform this procedure (in-office).  I don't know who specifically performs this, but Dr. Posey Pronto may know.

## 2019-09-21 ENCOUNTER — Ambulatory Visit (INDEPENDENT_AMBULATORY_CARE_PROVIDER_SITE_OTHER): Payer: PPO | Admitting: Adult Health

## 2019-09-21 ENCOUNTER — Encounter: Payer: Self-pay | Admitting: Adult Health

## 2019-09-21 ENCOUNTER — Other Ambulatory Visit: Payer: Self-pay

## 2019-09-21 DIAGNOSIS — F4310 Post-traumatic stress disorder, unspecified: Secondary | ICD-10-CM

## 2019-09-21 DIAGNOSIS — F331 Major depressive disorder, recurrent, moderate: Secondary | ICD-10-CM | POA: Diagnosis not present

## 2019-09-21 DIAGNOSIS — F411 Generalized anxiety disorder: Secondary | ICD-10-CM | POA: Diagnosis not present

## 2019-09-21 DIAGNOSIS — G4700 Insomnia, unspecified: Secondary | ICD-10-CM | POA: Diagnosis not present

## 2019-09-21 MED ORDER — CARIPRAZINE HCL 1.5 MG PO CAPS: 1.5000 mg | ORAL_CAPSULE | Freq: Every day | ORAL | 0 refills | Status: DC

## 2019-09-21 NOTE — Progress Notes (Signed)
[facility] MD/PA/NP Medication Check  09/21/2019 10:30 AM Paige [name]  MRN:  [id number]  Chief Complaint:  Depression, anxiety, insomnia, PTSD  Referred by: Bambi Cottle   HPI:   Describes mood today as "ok". Pleasant. Flat. Mood symptoms - reports depression, anxiety, and irritability. Stating "I still need some help". Also stating "I'm so tired of feeling bad". Stating "I'm having a lot of pain issues". Mother (79) with recent fall - broken patella. Has been trying to help her out with what she can. Recent shot of Prednisone - "not helping" - more side effects than pain relief". Chronic yeast infections.  Chronic pain with Fibromyalgia. Working with Garment/textile technologist. Stable interest and motivation. Continues to see therapist every week for PTSD. Taking medications as prescribed.  Energy levels poor. Active, unable to establish a regular exercise routine with current disabilities. Enjoys some usual interests and activities. Lives alone. Has 3 cats. Taking care of 4 ferrell kittens. Spending time with family - mother.  Appetite adequate.  Weight increased - 225. Goal weight 184.  Sleeps better some nights than others - "slept 8 hours last night". Averages 3 hours most nights. Napping during the day. Using CPAP machine. Helpful if she can keep it on. Feels like she is suffocating.  Focus and concentration difficulties. Unable to keep up with household - washing dishes. Has started projects and not finished them. Stating "I never finish anything". Diagnosed with cognitive issues - "keeping me from doing things I need to do"  Denies SI or HI. Denies AH or VH.  Previous medication trials: Lexapro, Paxil, Trazadone, Wellbutrin SR, Geodon, Seroquel, Risperdal, Depakote, Topamax, Lithium, Provigil, Ambien, Cytomel, Clonazepam and other benzodixepaines, Elavil, Effexor, remeron, Prozac, Anafranil, Tofranil, Zoloft, Geodon, Buspar, Adderall, Ritalin, Abilify, Rexulti, Lamictal, Latuda  Other  treatments: ECT - 18 treatments - significant memory loss.  Visit Diagnosis:    ICD-10-CM   1. Generalized anxiety disorder  F41.1 cariprazine (VRAYLAR) capsule  2. PTSD (post-traumatic stress disorder)  F43.10 cariprazine (VRAYLAR) capsule  3. Insomnia, unspecified type  G47.00 cariprazine (VRAYLAR) capsule  4. Major depressive disorder, recurrent episode, moderate (HCC)  F33.1 cariprazine (VRAYLAR) capsule    Past Psychiatric History: Admitted 3 times - Has been hospitalized 3 times. First time was in [place] - depressed and suicidal. Had 18 ECT treatments. Hospitalized at [facility]. And third time with intensive outpatient.   Past Medical History:  Past Medical History:  Diagnosis Date  . Allergic rhinitis   . Anemia   . Anxiety disorder   . Chronic fatigue   . Chronic headaches   . Chronic pain   . Depression   . Endometriosis   . Family history of colon cancer    mother,uncles,aunts  . Fibromyalgia   . Gallstones   . GERD (gastroesophageal reflux disease)   . Hypothyroidism   . IBS (irritable bowel syndrome)   . Insomnia   . Iron deficiency   . Laryngopharyngeal reflux (LPR)   . Obesity   . Personal history of colonic polyps 04/2006   polypoid  . PONV (postoperative nausea and vomiting)   . RLS (restless legs syndrome)     Past Surgical History:  Procedure Laterality Date  . ANTERIOR CRUCIATE LIGAMENT REPAIR Left 2003  . APPENDECTOMY  1993  . CARPAL TUNNEL RELEASE Left 09/25/2017   Procedure: LEFT CARPAL TUNNEL RELEASE;  Surgeon: Daryll Brod, MD;  Location: [facility];  Service: Orthopedics;  Laterality: Left;  . CHOLECYSTECTOMY  1999  . COLONOSCOPY  2009  [facility]  . REPAIR ANKLE LIGAMENT Left 2001  . ROUX-EN-Y GASTRIC BYPASS  04/2004   Dr Johnathan Hausen  . TONSILLECTOMY AND ADENOIDECTOMY  1976    Family Psychiatric History: Denies  Family History:  Family History  Problem Relation Age of Onset  . Colon cancer Mother         uncles,aunts  . Diabetes Father        paternal grandmother,sister  . Leukemia Father   . Lymphoma Father   . Skin cancer Father   . Heart disease Father   . Diabetes Sister   . Stomach cancer Neg Hx   . Esophageal cancer Neg Hx   . Rectal cancer Neg Hx     Social History:  Social History   Socioeconomic History  . Marital status: Single    Spouse name: Not on file  . Number of children: 0  . Years of education: Not on file  . Highest education level: Not on file  Occupational History  . Occupation: RN/disabled  Social Needs  . Financial resource strain: Not on file  . Food insecurity    Worry: Not on file    Inability: Not on file  . Transportation needs    Medical: Not on file    Non-medical: Not on file  Tobacco Use  . Smoking status: Never Smoker  . Smokeless tobacco: Never Used  Substance and Sexual Activity  . Alcohol use: Not Currently    Alcohol/week: 0.0 standard drinks    Comment: rarely  . Drug use: No  . Sexual activity: Not on file  Lifestyle  . Physical activity    Days per week: Not on file    Minutes per session: Not on file  . Stress: Not on file  Relationships  . Social Herbalist on phone: Not on file    Gets together: Not on file    Attends religious service: Not on file    Active member of club or organization: Not on file    Attends meetings of clubs or organizations: Not on file    Relationship status: Not on file  Other Topics Concern  . Not on file  Social History Narrative   Patient is left-handed. She lives alone in a one level home. She has 3 cats. She is unable to exercise. Caffeine one soda / day. On disability now. 2 bachelor degrees    Allergies:  Allergies  Allergen Reactions  . Oxycodone-Acetaminophen Hives  . Topiramate Shortness Of Breath and Other (See Comments)    Really wierd feeling Off balance; cloudy thinking Really wierd feeling  . Cyclobenzaprine Other (See Comments)    Other reaction(s):  Unknown Hallucinations Unknown   . Meloxicam Other (See Comments)  . Morphine     Other reaction(s): Unknown   . Naltrexone Other (See Comments)  . Pork (Porcine) Protein Other (See Comments)    Other reaction(s): Other (See Comments) Migraines Other reaction(s): Other (See Comments) Migraines  . Sumatriptan Other (See Comments)    Difficulty breathing Other reaction(s): Other (see comments) Off balance   . Tape Dermatitis  . Flexeril [Cyclobenzaprine Hcl]   . Imitrex [Sumatriptan Base]   . Morphine And Related   . Oxycodone Hcl   . Percocet [Oxycodone-Acetaminophen]     Metabolic Disorder Labs: No results found for: HGBA1C, MPG No results found for: PROLACTIN No results found for: CHOL, TRIG, HDL, CHOLHDL, VLDL, LDLCALC Lab Results  Component Value Date   TSH 1.72 05/26/2019  Therapeutic Level Labs: No results found for: LITHIUM No results found for: VALPROATE No components found for:  CBMZ  Current Medications: Current Outpatient Medications  Medication Sig Dispense Refill  . buPROPion (WELLBUTRIN SR) 150 MG 12 hr tablet Take 150 mg by mouth daily. Wellbutrin 150XL one tab daily    . cariprazine (VRAYLAR) capsule Take 1 capsule (1.5 mg total) by mouth daily. 28 capsule 0  . cetirizine (ZYRTEC) 10 MG tablet Take 1 tablet (10 mg total) by mouth daily. 1 tablet 0  . dicyclomine (BENTYL) 10 MG capsule Take 1 capsule (10 mg total) by mouth 3 (three) times daily as needed for spasms. 90 capsule 11  . DULoxetine (CYMBALTA) 60 MG capsule Take 1 capsule (60 mg total) by mouth 2 (two) times daily. 60 capsule 2  . Erenumab-aooe (AIMOVIG) 70 MG/ML SOAJ Inject 70 mg into the skin every 28 (twenty-eight) days. 1 pen 11  . Ferrous Sulfate Dried (SLOW RELEASE IRON) 45 MG TBCR Take 1 tablet by mouth daily.    Marland Kitchen gabapentin (NEURONTIN) 800 MG tablet Take 1 tablet (800 mg total) by mouth 3 (three) times daily. 90 tablet 3  . ipratropium (ATROVENT) 0.03 % nasal spray Place 2 sprays  into both nostrils as needed.  30 mL 12  . levothyroxine (SYNTHROID, LEVOTHROID) 88 MCG tablet Take 88 mcg by mouth daily before breakfast.    . nystatin cream (MYCOSTATIN) Apply 1 application topically daily as needed for dry skin.    Marland Kitchen olopatadine (PATANOL) 0.1 % ophthalmic solution Place 1 drop into both eyes daily as needed for allergies.     Marland Kitchen omeprazole (PRILOSEC) 40 MG capsule Take 1 capsule (40 mg total) by mouth daily. (Patient taking differently: Take 20 mg by mouth daily. 20mg  2x/day) 90 capsule 3  . ondansetron (ZOFRAN-ODT) 4 MG disintegrating tablet Take 4 mg by mouth every 8 (eight) hours as needed for nausea or vomiting.    Vladimir Faster Glycol-Propyl Glycol (SYSTANE) 0.4-0.3 % SOLN Apply 1 drop to eye as needed.    . polyethylene glycol (MIRALAX / GLYCOLAX) 17 g packet Take 17 g by mouth 2 (two) times daily.    . potassium chloride (K-DUR,KLOR-CON) 10 MEQ tablet Take 1 tablet by mouth daily.    . promethazine (PHENERGAN) 12.5 MG tablet Take 1 tablet (12.5 mg total) by mouth every 8 (eight) hours as needed for nausea or vomiting. 30 tablet 0  . rizatriptan (MAXALT) 10 MG tablet Take 1 tablet earliest onset of migraine.  May repeat once in 2 hours if needed 10 tablet 3  . rOPINIRole (REQUIP) 2 MG tablet Take 2 mg by mouth 2 (two) times daily.    Marland Kitchen terconazole (TERAZOL 3) 0.8 % vaginal cream Place 1 applicator vaginally at bedtime.    . torsemide (DEMADEX) 100 MG tablet Take 50 mg by mouth daily.     . traZODone (DESYREL) 100 MG tablet Take 200 mg by mouth at bedtime.     Current Facility-Administered Medications  Medication Dose Route Frequency Provider Last Rate Last Dose  . 0.9 %  sodium chloride infusion  500 mL Intravenous Once Ladene Artist, MD        Medication Side Effects: none  Orders placed this visit:  No orders of the defined types were placed in this encounter.   Psychiatric Specialty Exam:  ROS  There were no vitals taken for this visit.There is no height or  weight on file to calculate BMI.  General Appearance: Neat and Well  Groomed  Eye Contact:  Good  Speech:  Clear and Coherent  Volume:  Normal  Mood:  Anxious, Depressed and Irritable  Affect:  Congruent  Thought Process:  Coherent  Orientation:  Full (Time, Place, and Person)  Thought Content: Logical   Suicidal Thoughts:  No  Homicidal Thoughts:  No  Memory:  WNL  Judgement:  Good  Insight:  Good  Psychomotor Activity:  Normal  Concentration:  Concentration: Fair  Recall:  Poor  Fund of Knowledge: Good  Language: Good  Assets:  Communication Skills Desire for Improvement Financial Resources/Insurance Housing Intimacy Leisure Time Physical Health Resilience Social Support Talents/Skills Transportation Vocational/Educational  ADL's:  Intact  Cognition: WNL  Prognosis:  Fair   Screenings:  Mini-Mental     Office Visit from 04/07/2018 in [facility]  Total Score (max 30 points )  29    PHQ2-9     Patient Outreach from 05/22/2018 in [facility] Patient Outreach from 05/13/2018 in [facility] Patient Outreach Telephone from 04/30/2018 in [facility]  PHQ-2 Total Score  5  5  2   PHQ-9 Total Score  20  18  13       Receiving Psychotherapy: Yes   Treatment Plan/Recommendations:   Plan:  1. Wellbutrin SR 150mg  in the am  2. Cymbalta 60mg  BID 3. Trazadone 100mg  - 2 at hs   Add Vraylar 1.5mg  daily - samples given  Continue therapy  Request previous notes from [facility].   RTC 4 weeks  Patient advised to contact office with any questions, adverse effects, or acute worsening in signs and symptoms.  Discussed potential metabolic side effects associated with atypical antipsychotics, as well as potential risk for movement side effects. Advised pt to contact office if movement side effects occur.    Aloha Gell, NP

## 2019-09-22 ENCOUNTER — Telehealth: Payer: Self-pay | Admitting: Neurology

## 2019-09-22 MED ORDER — UBRELVY 100 MG PO TABS: 1.0000 | ORAL_TABLET | Freq: Every day | ORAL | 3 refills | Status: DC | PRN

## 2019-09-22 NOTE — Telephone Encounter (Signed)
Pt request new Rx for Ubrelvy sent see other notes from 09/02/19 provider approved

## 2019-09-22 NOTE — Telephone Encounter (Signed)
Left message with the after hour service on 09-22-19 @ 12:42   Caller states was told last week that we would send a RX to the pharmacy and they have not received anything from our office. Pt is getting tired of calling  Please call patient

## 2019-09-23 DIAGNOSIS — F332 Major depressive disorder, recurrent severe without psychotic features: Secondary | ICD-10-CM | POA: Diagnosis not present

## 2019-09-23 DIAGNOSIS — F4310 Post-traumatic stress disorder, unspecified: Secondary | ICD-10-CM | POA: Diagnosis not present

## 2019-09-23 DIAGNOSIS — G894 Chronic pain syndrome: Secondary | ICD-10-CM | POA: Diagnosis not present

## 2019-09-24 ENCOUNTER — Encounter: Payer: Self-pay | Admitting: *Deleted

## 2019-09-24 ENCOUNTER — Telehealth: Payer: Self-pay | Admitting: Neurology

## 2019-09-24 DIAGNOSIS — Z6834 Body mass index (BMI) 34.0-34.9, adult: Secondary | ICD-10-CM | POA: Diagnosis not present

## 2019-09-24 DIAGNOSIS — N898 Other specified noninflammatory disorders of vagina: Secondary | ICD-10-CM | POA: Diagnosis not present

## 2019-09-24 DIAGNOSIS — M549 Dorsalgia, unspecified: Secondary | ICD-10-CM | POA: Diagnosis not present

## 2019-09-24 DIAGNOSIS — R238 Other skin changes: Secondary | ICD-10-CM | POA: Diagnosis not present

## 2019-09-24 DIAGNOSIS — L989 Disorder of the skin and subcutaneous tissue, unspecified: Secondary | ICD-10-CM | POA: Diagnosis not present

## 2019-09-24 NOTE — Telephone Encounter (Signed)
Patient needs to know where she needs to get the Punch Biopsy done. Also she states that the  ubrelvy is a 1000.00 dollars and she will not be able to afford that. She wanted to know if we could call her in something else or is there anything that we can do to help with getting the cost down   She uses the [facility]

## 2019-09-24 NOTE — Telephone Encounter (Signed)
Patient returned your call. Thank you

## 2019-09-24 NOTE — Telephone Encounter (Signed)
Spoke with patient I will send out co-pay card for her via mail Also given contact information punch skin biopsy at [facility] in [place]  [address], [place], [place] [address] Phone: [phone] Fax: [phone]

## 2019-09-24 NOTE — Progress Notes (Addendum)
Paige [name] (Key: [id number]) Rx #[id number] Roselyn Meier 100MG  tablets   Form [employer] Medicare 4-Part NCPDP Electronic PA Form Created 2 days ago Sent to Plan 2 hours ago Plan Response 2 hours ago Submit Clinical Questions 2 hours ago Determination Favorable 6 minutes ago Message from Plan PA Case: [id number], Status: Approved, Coverage Starts on: 09/24/2019 12:00:00 AM, Coverage Ends on: 12/01/2020 12:00:00 AM.

## 2019-09-29 ENCOUNTER — Ambulatory Visit (INDEPENDENT_AMBULATORY_CARE_PROVIDER_SITE_OTHER): Payer: PPO | Admitting: Psychology

## 2019-09-29 DIAGNOSIS — F4310 Post-traumatic stress disorder, unspecified: Secondary | ICD-10-CM

## 2019-09-29 DIAGNOSIS — S81811A Laceration without foreign body, right lower leg, initial encounter: Secondary | ICD-10-CM | POA: Diagnosis not present

## 2019-10-01 ENCOUNTER — Telehealth: Payer: Self-pay | Admitting: Neurology

## 2019-10-01 NOTE — Telephone Encounter (Signed)
Please inform patient that Aimovig may take several months to take full effect, so continue injections as planned.

## 2019-10-01 NOTE — Telephone Encounter (Signed)
Patient is still having headaches every morning and states the medication is not working. She does states that the recuse medication is helping she would like to speak to someone please call

## 2019-10-01 NOTE — Telephone Encounter (Signed)
Called patient and made her aware of MD's recommendation that Aimovig may take several months to take full effect. Verbalized understanding.

## 2019-10-01 NOTE — Telephone Encounter (Signed)
Called patient back and she stated her Roselyn Meier has been approved and she said it DOES WORK and helps her headaches.  Her concern that she wanted to share with Dr. Tomi Likens was she is still having daily headaches. Even though the Roselyn Meier does work she wanted him to know that she is still having them every day. Stated she was not having them daily while on Botox (until Botox stopped working). She was started on Aimovig and has only had one injection of that earlier this month and will be doing her 2nd one in about 10 days.   Informed patient to use the Roselyn Meier as needed and will pass on this information to MD. Verbalized understanding and she stated she is "okay for now" as has abortive that works but just wanted him to know she is still having daily headaches.

## 2019-10-04 DIAGNOSIS — F332 Major depressive disorder, recurrent severe without psychotic features: Secondary | ICD-10-CM | POA: Diagnosis not present

## 2019-10-04 DIAGNOSIS — G894 Chronic pain syndrome: Secondary | ICD-10-CM | POA: Diagnosis not present

## 2019-10-04 DIAGNOSIS — F4310 Post-traumatic stress disorder, unspecified: Secondary | ICD-10-CM | POA: Diagnosis not present

## 2019-10-06 ENCOUNTER — Ambulatory Visit (INDEPENDENT_AMBULATORY_CARE_PROVIDER_SITE_OTHER): Payer: PPO | Admitting: Psychology

## 2019-10-06 DIAGNOSIS — F4310 Post-traumatic stress disorder, unspecified: Secondary | ICD-10-CM | POA: Diagnosis not present

## 2019-10-07 ENCOUNTER — Telehealth: Payer: Self-pay | Admitting: Adult Health

## 2019-10-07 NOTE — Telephone Encounter (Signed)
Pt is taking Vraylar 1.5mg  per day and believes it is helping some. She would like to go up to 3.0mg  per day. She will want it called in and because it will most likely need a PA, she also wants to pick up samples of 3.0mg . She will be in [place] tomorrow.

## 2019-10-07 NOTE — Telephone Encounter (Signed)
Discussed with patient and will get samples tomorrow Vraylar.

## 2019-10-07 NOTE — Telephone Encounter (Signed)
We can discuss dose change at next visit - we can give her more samples for now to try the 3 mg, but it takes 2 to 3 weeks for the 1.5 to work.

## 2019-10-08 DIAGNOSIS — D485 Neoplasm of uncertain behavior of skin: Secondary | ICD-10-CM | POA: Diagnosis not present

## 2019-10-08 DIAGNOSIS — Z85828 Personal history of other malignant neoplasm of skin: Secondary | ICD-10-CM | POA: Diagnosis not present

## 2019-10-08 DIAGNOSIS — L97811 Non-pressure chronic ulcer of other part of right lower leg limited to breakdown of skin: Secondary | ICD-10-CM | POA: Diagnosis not present

## 2019-10-08 DIAGNOSIS — B079 Viral wart, unspecified: Secondary | ICD-10-CM | POA: Diagnosis not present

## 2019-10-11 ENCOUNTER — Other Ambulatory Visit: Payer: Self-pay

## 2019-10-11 DIAGNOSIS — M5416 Radiculopathy, lumbar region: Secondary | ICD-10-CM | POA: Diagnosis not present

## 2019-10-11 DIAGNOSIS — M4716 Other spondylosis with myelopathy, lumbar region: Secondary | ICD-10-CM | POA: Diagnosis not present

## 2019-10-12 ENCOUNTER — Other Ambulatory Visit: Payer: Self-pay

## 2019-10-12 ENCOUNTER — Ambulatory Visit (INDEPENDENT_AMBULATORY_CARE_PROVIDER_SITE_OTHER): Payer: PPO | Admitting: Podiatry

## 2019-10-12 DIAGNOSIS — M79671 Pain in right foot: Secondary | ICD-10-CM

## 2019-10-12 DIAGNOSIS — M792 Neuralgia and neuritis, unspecified: Secondary | ICD-10-CM

## 2019-10-12 DIAGNOSIS — G629 Polyneuropathy, unspecified: Secondary | ICD-10-CM

## 2019-10-12 DIAGNOSIS — M79672 Pain in left foot: Secondary | ICD-10-CM | POA: Diagnosis not present

## 2019-10-12 DIAGNOSIS — L989 Disorder of the skin and subcutaneous tissue, unspecified: Secondary | ICD-10-CM

## 2019-10-13 ENCOUNTER — Ambulatory Visit (INDEPENDENT_AMBULATORY_CARE_PROVIDER_SITE_OTHER): Payer: PPO | Admitting: Psychology

## 2019-10-13 DIAGNOSIS — R062 Wheezing: Secondary | ICD-10-CM | POA: Diagnosis not present

## 2019-10-13 DIAGNOSIS — F4310 Post-traumatic stress disorder, unspecified: Secondary | ICD-10-CM | POA: Diagnosis not present

## 2019-10-15 DIAGNOSIS — F4310 Post-traumatic stress disorder, unspecified: Secondary | ICD-10-CM | POA: Diagnosis not present

## 2019-10-15 DIAGNOSIS — F332 Major depressive disorder, recurrent severe without psychotic features: Secondary | ICD-10-CM | POA: Diagnosis not present

## 2019-10-15 DIAGNOSIS — G894 Chronic pain syndrome: Secondary | ICD-10-CM | POA: Diagnosis not present

## 2019-10-18 DIAGNOSIS — G894 Chronic pain syndrome: Secondary | ICD-10-CM | POA: Diagnosis not present

## 2019-10-18 DIAGNOSIS — G4733 Obstructive sleep apnea (adult) (pediatric): Secondary | ICD-10-CM | POA: Diagnosis not present

## 2019-10-19 ENCOUNTER — Ambulatory Visit (INDEPENDENT_AMBULATORY_CARE_PROVIDER_SITE_OTHER): Payer: PPO | Admitting: Adult Health

## 2019-10-19 ENCOUNTER — Other Ambulatory Visit: Payer: Self-pay

## 2019-10-19 ENCOUNTER — Encounter: Payer: Self-pay | Admitting: Adult Health

## 2019-10-19 DIAGNOSIS — F411 Generalized anxiety disorder: Secondary | ICD-10-CM | POA: Diagnosis not present

## 2019-10-19 DIAGNOSIS — G43009 Migraine without aura, not intractable, without status migrainosus: Secondary | ICD-10-CM | POA: Diagnosis not present

## 2019-10-19 DIAGNOSIS — F331 Major depressive disorder, recurrent, moderate: Secondary | ICD-10-CM | POA: Diagnosis not present

## 2019-10-19 DIAGNOSIS — F4310 Post-traumatic stress disorder, unspecified: Secondary | ICD-10-CM

## 2019-10-19 DIAGNOSIS — G4700 Insomnia, unspecified: Secondary | ICD-10-CM | POA: Diagnosis not present

## 2019-10-19 DIAGNOSIS — G4733 Obstructive sleep apnea (adult) (pediatric): Secondary | ICD-10-CM | POA: Diagnosis not present

## 2019-10-19 MED ORDER — CARIPRAZINE HCL 3 MG PO CAPS: 3.0000 mg | ORAL_CAPSULE | Freq: Every day | ORAL | 1 refills | Status: DC

## 2019-10-19 NOTE — Progress Notes (Signed)
[facility] MD/PA/NP Medication Check  10/19/2019 9:24 AM Paige [name]  MRN:  [id number]   Virtual Visit via Telephone Note  I connected with pt on 10/19/19 at 09:00 PM EDT by telephone and verified that I am speaking with the correct person using two identifiers.   I discussed the limitations, risks, security and privacy concerns of performing an evaluation and management service by telephone and the availability of in person appointments. I also discussed with the patient that there may be a patient responsible charge related to this service. The patient expressed understanding and agreed to proceed.   I discussed the assessment and treatment plan with the patient. The patient was provided an opportunity to ask questions and all were answered. The patient agreed with the plan and demonstrated an understanding of the instructions.   The patient was advised to call back or seek an in-person evaluation if the symptoms worsen or if the condition fails to improve as anticipated.  I provided 30 minutes of non-face-to-face time during this encounter.  The patient was located at home.  The provider was located at [place].   Chief Complaint:  Chief Complaint    Anxiety; Depression; Insomnia; Trauma    Depression, anxiety, insomnia, PTSD  Referred by: Bambi Cottle   HPI:   Describes mood today as "ok". Pleasant. Mood symptoms - reports decreased depression, anxiety, and irritability - stating "the Arman Filter is working well for me". Is taking 3mg  daily. Started spinal stimulator trial yesterday. Stating "it is working for me". Gets taken out on Friday - gets permanent placement in 2 to 4 weeks. Also stating "I haven't felt this good in years". Mother has recovered well from fall - walking without brace. Working with Neurologist - Dr Eugenio Hoes. Improved interest and motivation. Continues to see therapist Chiropractor) every week for PTSD. Taking medications as prescribed.  Energy  levels improved - "feels like doing things again". Active, unable to establish a regular exercise routine with current disabilities. Disabled since 2009. Enjoys some usual interests and activities. Lives alone with 3 cats. Mother local - spending time with her. Helping out with "ferrell cats". Has a racoon that visits her. Neighbor 80 years old.  Appetite adequate. Weight increased - 225 goal weight 184.  Sleeps better some nights than others - slept 6 hours and 19 minutes last night without pan. Naps during the day.   Using CPAP machine.  Focus and concentration difficulties - not as bad. Completing some household tasks. Made two wreaths and a bow. Learned how on You Tube. Denies SI or HI. Denies AH or VH.  Previous medication trials: Lexapro, Paxil, Trazadone, Wellbutrin SR, Geodon, Seroquel, Risperdal, Depakote, Topamax, Lithium, Provigil, Ambien, Cytomel, Clonazepam and other benzodixepaines, Elavil, Effexor, remeron, Prozac, Anafranil, Tofranil, Zoloft, Geodon, Buspar, Adderall, Ritalin, Abilify, Rexulti, Lamictal, Latuda  Other treatments: ECT - 18 treatments - significant memory loss.  Visit Diagnosis:    ICD-10-CM   1. Generalized anxiety disorder  F41.1 cariprazine (VRAYLAR) capsule  2. PTSD (post-traumatic stress disorder)  F43.10 cariprazine (VRAYLAR) capsule  3. Insomnia, unspecified type  G47.00 cariprazine (VRAYLAR) capsule  4. Major depressive disorder, recurrent episode, moderate (HCC)  F33.1 cariprazine (VRAYLAR) capsule    Past Psychiatric History: Admitted 3 times - Has been hospitalized 3 times. First time was in [place] - depressed and suicidal. Had 18 ECT treatments. Hospitalized at [place]. And third time with intensive outpatient.   Past Medical History:  Past Medical History:  Diagnosis Date  . Allergic  rhinitis   . Anemia   . Anxiety disorder   . Chronic fatigue   . Chronic headaches   . Chronic pain   . Depression   . Endometriosis   . Family history  of colon cancer    mother,uncles,aunts  . Fibromyalgia   . Gallstones   . GERD (gastroesophageal reflux disease)   . Hypothyroidism   . IBS (irritable bowel syndrome)   . Insomnia   . Iron deficiency   . Laryngopharyngeal reflux (LPR)   . Obesity   . Personal history of colonic polyps 04/2006   polypoid  . PONV (postoperative nausea and vomiting)   . RLS (restless legs syndrome)     Past Surgical History:  Procedure Laterality Date  . ANTERIOR CRUCIATE LIGAMENT REPAIR Left 2003  . APPENDECTOMY  1993  . CARPAL TUNNEL RELEASE Left 09/25/2017   Procedure: LEFT CARPAL TUNNEL RELEASE;  Surgeon: Daryll Brod, MD;  Location: [facility];  Service: Orthopedics;  Laterality: Left;  . CHOLECYSTECTOMY  1999  . COLONOSCOPY  2009   [facility]  . REPAIR ANKLE LIGAMENT Left 2001  . ROUX-EN-Y GASTRIC BYPASS  04/2004   Dr Johnathan Hausen  . TONSILLECTOMY AND ADENOIDECTOMY  1976    Family Psychiatric History: Denies any family psychiatric illnesses.   Family History:  Family History  Problem Relation Age of Onset  . Colon cancer Mother        uncles,aunts  . Diabetes Father        paternal grandmother,sister  . Leukemia Father   . Lymphoma Father   . Skin cancer Father   . Heart disease Father   . Diabetes Sister   . Stomach cancer Neg Hx   . Esophageal cancer Neg Hx   . Rectal cancer Neg Hx     Social History:  Social History   Socioeconomic History  . Marital status: Single    Spouse name: Not on file  . Number of children: 0  . Years of education: Not on file  . Highest education level: Not on file  Occupational History  . Occupation: RN/disabled  Social Needs  . Financial resource strain: Not on file  . Food insecurity    Worry: Not on file    Inability: Not on file  . Transportation needs    Medical: Not on file    Non-medical: Not on file  Tobacco Use  . Smoking status: Never Smoker  . Smokeless tobacco: Never Used  Substance and Sexual Activity   . Alcohol use: Not Currently    Alcohol/week: 0.0 standard drinks    Comment: rarely  . Drug use: No  . Sexual activity: Not on file  Lifestyle  . Physical activity    Days per week: Not on file    Minutes per session: Not on file  . Stress: Not on file  Relationships  . Social Herbalist on phone: Not on file    Gets together: Not on file    Attends religious service: Not on file    Active member of club or organization: Not on file    Attends meetings of clubs or organizations: Not on file    Relationship status: Not on file  Other Topics Concern  . Not on file  Social History Narrative   Patient is left-handed. She lives alone in a one level home. She has 3 cats. She is unable to exercise. Caffeine one soda / day. On disability now. 2 bachelor degrees  Allergies:  Allergies  Allergen Reactions  . Oxycodone-Acetaminophen Hives  . Topiramate Shortness Of Breath and Other (See Comments)    Really wierd feeling Off balance; cloudy thinking Really wierd feeling  . Cyclobenzaprine Other (See Comments)    Other reaction(s): Unknown Hallucinations Unknown   . Meloxicam Other (See Comments)  . Morphine     Other reaction(s): Unknown   . Naltrexone Other (See Comments)  . Pork (Porcine) Protein Other (See Comments)    Other reaction(s): Other (See Comments) Migraines Other reaction(s): Other (See Comments) Migraines  . Sumatriptan Other (See Comments)    Difficulty breathing Other reaction(s): Other (see comments) Off balance   . Tape Dermatitis  . Flexeril [Cyclobenzaprine Hcl]   . Imitrex [Sumatriptan Base]   . Morphine And Related   . Oxycodone Hcl   . Percocet [Oxycodone-Acetaminophen]     Metabolic Disorder Labs: No results found for: HGBA1C, MPG No results found for: PROLACTIN No results found for: CHOL, TRIG, HDL, CHOLHDL, VLDL, LDLCALC Lab Results  Component Value Date   TSH 1.72 05/26/2019    Therapeutic Level Labs: No results  found for: LITHIUM No results found for: VALPROATE No components found for:  CBMZ  Current Medications: Current Outpatient Medications  Medication Sig Dispense Refill  . buPROPion (WELLBUTRIN SR) 150 MG 12 hr tablet Take 150 mg by mouth daily. Wellbutrin 150XL one tab daily    . cariprazine (VRAYLAR) capsule Take 1 capsule (1.5 mg total) by mouth daily. 28 capsule 0  . cariprazine (VRAYLAR) capsule Take 1 capsule (3 mg total) by mouth daily. 90 capsule 1  . cetirizine (ZYRTEC) 10 MG tablet Take 1 tablet (10 mg total) by mouth daily. 1 tablet 0  . dicyclomine (BENTYL) 10 MG capsule Take 1 capsule (10 mg total) by mouth 3 (three) times daily as needed for spasms. 90 capsule 11  . DULoxetine (CYMBALTA) 60 MG capsule Take 1 capsule (60 mg total) by mouth 2 (two) times daily. 60 capsule 2  . Erenumab-aooe (AIMOVIG) 70 MG/ML SOAJ Inject 70 mg into the skin every 28 (twenty-eight) days. 1 pen 11  . Ferrous Sulfate Dried (SLOW RELEASE IRON) 45 MG TBCR Take 1 tablet by mouth daily.    Marland Kitchen HYDROcodone-acetaminophen (NORCO) 10-325 MG tablet Take 1 tablet by mouth every 6 (six) hours as needed.    Marland Kitchen ipratropium (ATROVENT) 0.03 % nasal spray Place 2 sprays into both nostrils as needed.  30 mL 12  . lamoTRIgine (LAMICTAL) 25 MG tablet Take 25 mg by mouth daily.    Marland Kitchen levothyroxine (SYNTHROID, LEVOTHROID) 88 MCG tablet Take 88 mcg by mouth daily before breakfast.    . nystatin cream (MYCOSTATIN) Apply 1 application topically daily as needed for dry skin.    Marland Kitchen olopatadine (PATANOL) 0.1 % ophthalmic solution Place 1 drop into both eyes daily as needed for allergies.     Marland Kitchen omeprazole (PRILOSEC) 20 MG capsule     . ondansetron (ZOFRAN-ODT) 4 MG disintegrating tablet Take 4 mg by mouth every 8 (eight) hours as needed for nausea or vomiting.    Vladimir Faster Glycol-Propyl Glycol (SYSTANE) 0.4-0.3 % SOLN Apply 1 drop to eye as needed.    . polyethylene glycol (MIRALAX / GLYCOLAX) 17 g packet Take 17 g by mouth 2 (two)  times daily.    . potassium chloride (K-DUR,KLOR-CON) 10 MEQ tablet Take 1 tablet by mouth daily.    . promethazine (PHENERGAN) 12.5 MG tablet Take 1 tablet (12.5 mg total) by mouth every 8 (  eight) hours as needed for nausea or vomiting. 30 tablet 0  . rizatriptan (MAXALT) 10 MG tablet Take 1 tablet earliest onset of migraine.  May repeat once in 2 hours if needed 10 tablet 3  . rOPINIRole (REQUIP) 2 MG tablet Take 2 mg by mouth 2 (two) times daily.    Marland Kitchen terconazole (TERAZOL 3) 0.8 % vaginal cream Place 1 applicator vaginally at bedtime.    . torsemide (DEMADEX) 100 MG tablet Take 50 mg by mouth daily.     . traZODone (DESYREL) 100 MG tablet Take 200 mg by mouth at bedtime.    Marland Kitchen Ubrogepant (UBRELVY) 100 MG TABS Take 1 tablet by mouth daily as needed (at onset of headaches may repeat dose after 2 hours as needed max 200 mg in 24 hours). 16 tablet 3   Current Facility-Administered Medications  Medication Dose Route Frequency Provider Last Rate Last Dose  . 0.9 %  sodium chloride infusion  500 mL Intravenous Once Ladene Artist, MD        Medication Side Effects: none  Orders placed this visit:  No orders of the defined types were placed in this encounter.   Psychiatric Specialty Exam:  Review of Systems  Musculoskeletal: Positive for back pain, joint pain and myalgias.  Neurological: Positive for weakness. Negative for tremors.    There were no vitals taken for this visit.There is no height or weight on file to calculate BMI.  General Appearance: Neat and Well Groomed  Eye Contact:  Good  Speech:  Clear and Coherent  Volume:  Normal  Mood:  Anxious, Depressed and Irritable  Affect:  Congruent  Thought Process:  Coherent and Descriptions of Associations: Intact  Orientation:  Full (Time, Place, and Person)  Thought Content: Logical   Suicidal Thoughts:  No  Homicidal Thoughts:  No  Memory:  WNL  Judgement:  Good  Insight:  Good  Psychomotor Activity:  Normal  Concentration:   Concentration: Fair  Recall:  Poor  Fund of Knowledge: Good  Language: Good  Assets:  Communication Skills Desire for Improvement Financial Resources/Insurance Housing Intimacy Leisure Time Physical Health Resilience Social Support Talents/Skills Transportation Vocational/Educational  ADL's:  Intact  Cognition: WNL  Prognosis:  Fair   Screenings:  Mini-Mental     Office Visit from 04/07/2018 in [facility]  Total Score (max 30 points )  29    PHQ2-9     Patient Outreach from 05/22/2018 in [facility] Patient Out[facility] from 05/13/2018 in [facility] Telephone from 04/30/2018 in [facility]  PHQ-2 Total Score  5  5  2   PHQ-9 Total Score  20  18  13       Receiving Psychotherapy: Yes   Treatment Plan/Recommendations:   Plan:  1. Wellbutrin SR 150mg  in the am  2. Cymbalta 60mg  BID 3. Trazadone 100mg  - 2 at hs  4. Vraylar 3mg  daily - will leave samples   Continue therapy  Request previous notes from [facility].   RTC 4 weeks  Patient advised to contact office with any questions, adverse effects, or acute worsening in signs and symptoms.  Discussed potential metabolic side effects associated with atypical antipsychotics, as well as potential risk for movement side effects. Advised pt to contact office if movement side effects occur.    Aloha Gell, NP

## 2019-10-20 ENCOUNTER — Ambulatory Visit (INDEPENDENT_AMBULATORY_CARE_PROVIDER_SITE_OTHER): Payer: PPO | Admitting: Psychology

## 2019-10-20 DIAGNOSIS — F4310 Post-traumatic stress disorder, unspecified: Secondary | ICD-10-CM

## 2019-10-22 DIAGNOSIS — G894 Chronic pain syndrome: Secondary | ICD-10-CM | POA: Diagnosis not present

## 2019-10-22 DIAGNOSIS — F4310 Post-traumatic stress disorder, unspecified: Secondary | ICD-10-CM | POA: Diagnosis not present

## 2019-10-26 ENCOUNTER — Telehealth: Payer: Self-pay

## 2019-10-26 NOTE — Telephone Encounter (Signed)
Called patient she was informed of results and understands.

## 2019-10-26 NOTE — Telephone Encounter (Signed)
-----   Message from Pieter Partridge, DO sent at 10/26/2019  6:37 AM EST ----- Received results of punch skin biopsy.  No evidence of neuropathy.  There were some nonspecific changes which may be normal but may indicated FUTURE development of neuropathy.  If symptoms persist, we can repeat biopsy in 6 to 12 months.

## 2019-10-27 ENCOUNTER — Ambulatory Visit (INDEPENDENT_AMBULATORY_CARE_PROVIDER_SITE_OTHER): Payer: PPO | Admitting: Psychology

## 2019-10-27 DIAGNOSIS — M546 Pain in thoracic spine: Secondary | ICD-10-CM | POA: Diagnosis not present

## 2019-10-27 DIAGNOSIS — M4314 Spondylolisthesis, thoracic region: Secondary | ICD-10-CM | POA: Diagnosis not present

## 2019-10-27 DIAGNOSIS — M47814 Spondylosis without myelopathy or radiculopathy, thoracic region: Secondary | ICD-10-CM | POA: Diagnosis not present

## 2019-10-27 DIAGNOSIS — F4310 Post-traumatic stress disorder, unspecified: Secondary | ICD-10-CM | POA: Diagnosis not present

## 2019-10-27 DIAGNOSIS — M898X8 Other specified disorders of bone, other site: Secondary | ICD-10-CM | POA: Diagnosis not present

## 2019-10-27 DIAGNOSIS — M5124 Other intervertebral disc displacement, thoracic region: Secondary | ICD-10-CM | POA: Diagnosis not present

## 2019-11-01 ENCOUNTER — Ambulatory Visit: Payer: PPO | Admitting: Podiatry

## 2019-11-03 ENCOUNTER — Ambulatory Visit: Payer: PPO | Admitting: Psychology

## 2019-11-04 DIAGNOSIS — M797 Fibromyalgia: Secondary | ICD-10-CM | POA: Diagnosis not present

## 2019-11-04 DIAGNOSIS — M4716 Other spondylosis with myelopathy, lumbar region: Secondary | ICD-10-CM | POA: Diagnosis not present

## 2019-11-04 DIAGNOSIS — M5416 Radiculopathy, lumbar region: Secondary | ICD-10-CM | POA: Diagnosis not present

## 2019-11-04 DIAGNOSIS — G894 Chronic pain syndrome: Secondary | ICD-10-CM | POA: Diagnosis not present

## 2019-11-05 ENCOUNTER — Ambulatory Visit: Payer: PPO | Admitting: Neurology

## 2019-11-10 ENCOUNTER — Ambulatory Visit (INDEPENDENT_AMBULATORY_CARE_PROVIDER_SITE_OTHER): Payer: PPO | Admitting: Psychology

## 2019-11-10 DIAGNOSIS — F4310 Post-traumatic stress disorder, unspecified: Secondary | ICD-10-CM | POA: Diagnosis not present

## 2019-11-11 DIAGNOSIS — M797 Fibromyalgia: Secondary | ICD-10-CM | POA: Diagnosis not present

## 2019-11-11 DIAGNOSIS — G4733 Obstructive sleep apnea (adult) (pediatric): Secondary | ICD-10-CM | POA: Diagnosis not present

## 2019-11-11 DIAGNOSIS — E039 Hypothyroidism, unspecified: Secondary | ICD-10-CM | POA: Diagnosis not present

## 2019-11-11 DIAGNOSIS — Z6834 Body mass index (BMI) 34.0-34.9, adult: Secondary | ICD-10-CM | POA: Diagnosis not present

## 2019-11-11 DIAGNOSIS — Z01818 Encounter for other preprocedural examination: Secondary | ICD-10-CM | POA: Diagnosis not present

## 2019-11-11 DIAGNOSIS — M5030 Other cervical disc degeneration, unspecified cervical region: Secondary | ICD-10-CM | POA: Diagnosis not present

## 2019-11-11 DIAGNOSIS — M3501 Sicca syndrome with keratoconjunctivitis: Secondary | ICD-10-CM | POA: Diagnosis not present

## 2019-11-12 DIAGNOSIS — M4716 Other spondylosis with myelopathy, lumbar region: Secondary | ICD-10-CM | POA: Diagnosis not present

## 2019-11-15 ENCOUNTER — Other Ambulatory Visit: Payer: Self-pay | Admitting: Adult Health

## 2019-11-15 DIAGNOSIS — F331 Major depressive disorder, recurrent, moderate: Secondary | ICD-10-CM

## 2019-11-15 DIAGNOSIS — G4700 Insomnia, unspecified: Secondary | ICD-10-CM

## 2019-11-15 DIAGNOSIS — F4310 Post-traumatic stress disorder, unspecified: Secondary | ICD-10-CM

## 2019-11-15 DIAGNOSIS — F411 Generalized anxiety disorder: Secondary | ICD-10-CM

## 2019-11-16 ENCOUNTER — Ambulatory Visit (INDEPENDENT_AMBULATORY_CARE_PROVIDER_SITE_OTHER): Payer: PPO | Admitting: Adult Health

## 2019-11-16 ENCOUNTER — Encounter: Payer: Self-pay | Admitting: Adult Health

## 2019-11-16 DIAGNOSIS — F331 Major depressive disorder, recurrent, moderate: Secondary | ICD-10-CM | POA: Diagnosis not present

## 2019-11-16 DIAGNOSIS — G4700 Insomnia, unspecified: Secondary | ICD-10-CM

## 2019-11-16 DIAGNOSIS — F411 Generalized anxiety disorder: Secondary | ICD-10-CM

## 2019-11-16 DIAGNOSIS — F4310 Post-traumatic stress disorder, unspecified: Secondary | ICD-10-CM

## 2019-11-16 MED ORDER — BUPROPION HCL ER (SR) 150 MG PO TB12: 150.0000 mg | ORAL_TABLET | Freq: Every day | ORAL | 50 refills | Status: DC

## 2019-11-16 MED ORDER — CARIPRAZINE HCL 3 MG PO CAPS: 3.0000 mg | ORAL_CAPSULE | Freq: Every day | ORAL | 0 refills | Status: DC

## 2019-11-16 MED ORDER — TRAZODONE HCL 100 MG PO TABS: 200.0000 mg | ORAL_TABLET | Freq: Every day | ORAL | 5 refills | Status: DC

## 2019-11-16 MED ORDER — DULOXETINE HCL 60 MG PO CPEP: ORAL_CAPSULE | ORAL | 5 refills | Status: DC

## 2019-11-16 NOTE — Progress Notes (Signed)
Paige [name] [id number] 1966-03-09 54 y.o.  Virtual Visit via Telephone Note  I connected with pt on 11/16/19 at  9:00 AM EST by telephone and verified that I am speaking with the correct person using two identifiers.   I discussed the limitations, risks, security and privacy concerns of performing an evaluation and management service by telephone and the availability of in person appointments. I also discussed with the patient that there may be a patient responsible charge related to this service. The patient expressed understanding and agreed to proceed.   I discussed the assessment and treatment plan with the patient. The patient was provided an opportunity to ask questions and all were answered. The patient agreed with the plan and demonstrated an understanding of the instructions.   The patient was advised to call back or seek an in-person evaluation if the symptoms worsen or if the condition fails to improve as anticipated.  I provided 30 minutes of non-face-to-face time during this encounter.  The patient was located at home.  The provider was located at [place].   Paige Gell, NP   Subjective:   Patient ID:  Paige [name] is a 53 y.o. (DOB 1966/04/18) female.  Chief Complaint: No chief complaint on file.   HPI Paige [name] presents for follow-up of GAD, MDD, PTSD, insomnia.  Describes mood today as "ok". Pleasant. Mood symptoms - reports decreased depression, anxiety, and irritability. Stating "I'm doing ok". Has plenty of craft stuff to do if I'm not hurting. Pacing sit down and pace some more. Stating "that doesn't seem  So good'. It's hard to say if medication is working well or not". Upcoming appointment for spinal stimulator placement on December 28th. Decreased interest and motivation. Continues to [name] therapist Chiropractor) every week for PTSD. Taking medications as prescribed.  Energy levels lower. Active, unable to establish a  regular exercise routine with current disabilities. Disabled since 2009. Enjoys some usual interests and activities. Lives alone with 3 cats. Cats pulling ornaments off the tree. Ferrell cats. Mother local. Has a Education officer, museum that is working with her.  Appetite decreased. Weight increased - 225 goal weight 184.  Sleeps better some nights than others. Averages 4 to 5 hours a day - "broken sleep". Naps during the day. Using CPAP machine.  Focus and concentration difficulties. Completing some household tasks. Has not been crafting as much. Had a friend that came over and did some decorating.  Denies SI or HI. Denies AH or VH.  Previous medication trials: Lexapro, Paxil, Trazadone, Wellbutrin SR, Geodon, Seroquel, Risperdal, Depakote, Topamax, Lithium, Provigil, Ambien, Cytomel, Clonazepam and other benzodixepaines, Elavil, Effexor, remeron, Prozac, Anafranil, Tofranil, Zoloft, Geodon, Buspar, Adderall, Ritalin, Abilify, Rexulti, Lamictal, Latuda  Other treatments: ECT - 18 treatments - significant memory loss.  Review of Systems:  Review of Systems  Musculoskeletal: Negative for gait problem.  Neurological: Negative for tremors.  Psychiatric/Behavioral:       Please refer to HPI    Medications: I have reviewed the patient's current medications.  Current Outpatient Medications  Medication Sig Dispense Refill  . buPROPion (WELLBUTRIN SR) 150 MG 12 hr tablet Take 1 tablet (150 mg total) by mouth daily. Wellbutrin 150XL one tab daily 30 tablet 50  . cariprazine (VRAYLAR) capsule Take 1 capsule (1.5 mg total) by mouth daily. 28 capsule 0  . cariprazine (VRAYLAR) capsule Take 1 capsule (3 mg total) by mouth daily. 28 capsule 0  . cetirizine (ZYRTEC) 10 MG tablet Take 1 tablet (  10 mg total) by mouth daily. 1 tablet 0  . dicyclomine (BENTYL) 10 MG capsule Take 1 capsule (10 mg total) by mouth 3 (three) times daily as needed for spasms. 90 capsule 11  . DULoxetine (CYMBALTA) 60 MG capsule TAKE 1  CAPSULE BY MOUTH 2 TIMES DAILY 60 capsule 5  . Erenumab-aooe (AIMOVIG) 70 MG/ML SOAJ Inject 70 mg into the skin every 28 (twenty-eight) days. 1 pen 11  . Ferrous Sulfate Dried (SLOW RELEASE IRON) 45 MG TBCR Take 1 tablet by mouth daily.    Marland Kitchen HYDROcodone-acetaminophen (NORCO) 10-325 MG tablet Take 1 tablet by mouth every 6 (six) hours as needed.    Marland Kitchen ipratropium (ATROVENT) 0.03 % nasal spray Place 2 sprays into both nostrils as needed.  30 mL 12  . lamoTRIgine (LAMICTAL) 25 MG tablet Take 25 mg by mouth daily.    Marland Kitchen levothyroxine (SYNTHROID, LEVOTHROID) 88 MCG tablet Take 88 mcg by mouth daily before breakfast.    . nystatin cream (MYCOSTATIN) Apply 1 application topically daily as needed for dry skin.    Marland Kitchen olopatadine (PATANOL) 0.1 % ophthalmic solution Place 1 drop into both eyes daily as needed for allergies.     Marland Kitchen omeprazole (PRILOSEC) 20 MG capsule     . ondansetron (ZOFRAN-ODT) 4 MG disintegrating tablet Take 4 mg by mouth every 8 (eight) hours as needed for nausea or vomiting.    Vladimir Faster Glycol-Propyl Glycol (SYSTANE) 0.4-0.3 % SOLN Apply 1 drop to eye as needed.    . polyethylene glycol (MIRALAX / GLYCOLAX) 17 g packet Take 17 g by mouth 2 (two) times daily.    . potassium chloride (K-DUR,KLOR-CON) 10 MEQ tablet Take 1 tablet by mouth daily.    . promethazine (PHENERGAN) 12.5 MG tablet Take 1 tablet (12.5 mg total) by mouth every 8 (eight) hours as needed for nausea or vomiting. 30 tablet 0  . rizatriptan (MAXALT) 10 MG tablet Take 1 tablet earliest onset of migraine.  May repeat once in 2 hours if needed 10 tablet 3  . rOPINIRole (REQUIP) 2 MG tablet Take 2 mg by mouth 2 (two) times daily.    Marland Kitchen terconazole (TERAZOL 3) 0.8 % vaginal cream Place 1 applicator vaginally at bedtime.    . torsemide (DEMADEX) 100 MG tablet Take 50 mg by mouth daily.     . traZODone (DESYREL) 100 MG tablet Take 2 tablets (200 mg total) by mouth at bedtime. 60 tablet 5  . Ubrogepant (UBRELVY) 100 MG TABS Take 1  tablet by mouth daily as needed (at onset of headaches may repeat dose after 2 hours as needed max 200 mg in 24 hours). 16 tablet 3   Current Facility-Administered Medications  Medication Dose Route Frequency Provider Last Rate Last Admin  . 0.9 %  sodium chloride infusion  500 mL Intravenous Once Ladene Artist, MD        Medication Side Effects: None  Allergies:  Allergies  Allergen Reactions  . Oxycodone-Acetaminophen Hives  . Topiramate Shortness Of Breath and Other ([name] Comments)    Really wierd feeling Off balance; cloudy thinking Really wierd feeling  . Cyclobenzaprine Other ([name] Comments)    Other reaction(s): Unknown Hallucinations Unknown   . Meloxicam Other ([name] Comments)  . Morphine     Other reaction(s): Unknown   . Naltrexone Other ([name] Comments)  . Pork (Porcine) Protein Other ([name] Comments)    Other reaction(s): Other ([name] Comments) Migraines Other reaction(s): Other ([name] Comments) Migraines  . Sumatriptan Other ([name]  Comments)    Difficulty breathing Other reaction(s): Other ([name] comments) Off balance   . Tape Dermatitis  . Flexeril [Cyclobenzaprine Hcl]   . Imitrex [Sumatriptan Base]   . Morphine And Related   . Oxycodone Hcl   . Percocet [Oxycodone-Acetaminophen]     Past Medical History:  Diagnosis Date  . Allergic rhinitis   . Anemia   . Anxiety disorder   . Chronic fatigue   . Chronic headaches   . Chronic pain   . Depression   . Endometriosis   . Family history of colon cancer    mother,uncles,aunts  . Fibromyalgia   . Gallstones   . GERD (gastroesophageal reflux disease)   . Hypothyroidism   . IBS (irritable bowel syndrome)   . Insomnia   . Iron deficiency   . Laryngopharyngeal reflux (LPR)   . Obesity   . Personal history of colonic polyps 04/2006   polypoid  . PONV (postoperative nausea and vomiting)   . RLS (restless legs syndrome)     Family History  Problem Relation Age of Onset  . Colon cancer Mother         uncles,aunts  . Diabetes Father        paternal grandmother,sister  . Leukemia Father   . Lymphoma Father   . Skin cancer Father   . Heart disease Father   . Diabetes Sister   . Stomach cancer Neg Hx   . Esophageal cancer Neg Hx   . Rectal cancer Neg Hx     Social History   Socioeconomic History  . Marital status: Single    Spouse name: Not on file  . Number of children: 0  . Years of education: Not on file  . Highest education level: Not on file  Occupational History  . Occupation: RN/disabled  Tobacco Use  . Smoking status: Never Smoker  . Smokeless tobacco: Never Used  Substance and Sexual Activity  . Alcohol use: Not Currently    Alcohol/week: 0.0 standard drinks    Comment: rarely  . Drug use: No  . Sexual activity: Not on file  Other Topics Concern  . Not on file  Social History Narrative   Patient is left-handed. She lives alone in a one level home. She has 3 cats. She is unable to exercise. Caffeine one soda / day. On disability now. 2 bachelor degrees   Social Determinants of Health   Financial Resource Strain:   . Difficulty of Paying Living Expenses: Not on file  Food Insecurity:   . Worried About Charity fundraiser in the Last Year: Not on file  . Ran Out of Food in the Last Year: Not on file  Transportation Needs:   . Lack of Transportation (Medical): Not on file  . Lack of Transportation (Non-Medical): Not on file  Physical Activity:   . Days of Exercise per Week: Not on file  . Minutes of Exercise per Session: Not on file  Stress:   . Feeling of Stress : Not on file  Social Connections:   . Frequency of Communication with Friends and Family: Not on file  . Frequency of Social Gatherings with Friends and Family: Not on file  . Attends Religious Services: Not on file  . Active Member of Clubs or Organizations: Not on file  . Attends Archivist Meetings: Not on file  . Marital Status: Not on file  Intimate Partner Violence:   . Fear  of Current or Ex-Partner: Not on file  .  Emotionally Abused: Not on file  . Physically Abused: Not on file  . Sexually Abused: Not on file    Past Medical History, Surgical history, Social history, and Family history were reviewed and updated as appropriate.   Please [name] review of systems for further details on the patient's review from today.   Objective:   Physical Exam:  There were no vitals taken for this visit.  Physical Exam Constitutional:      General: She is not in acute distress.    Appearance: She is well-developed.  Musculoskeletal:        General: No deformity.  Neurological:     Mental Status: She is alert and oriented to person, place, and time.     Coordination: Coordination normal.  Psychiatric:        Attention and Perception: Attention and perception normal. She does not perceive auditory or visual hallucinations.        Mood and Affect: Mood is anxious and depressed. Affect is not labile, blunt, angry or inappropriate.        Speech: Speech normal.        Behavior: Behavior normal.        Thought Content: Thought content normal. Thought content is not paranoid or delusional. Thought content does not include homicidal or suicidal ideation. Thought content does not include homicidal or suicidal plan.        Cognition and Memory: Cognition and memory normal.        Judgment: Judgment normal.     Comments: Insight intact     Lab Review:     Component Value Date/Time   NA 140 05/26/2019 1046   K 3.4 (L) 05/26/2019 1046   CL 95 (L) 05/26/2019 1046   CO2 37 (H) 05/26/2019 1046   GLUCOSE 97 05/26/2019 1046   BUN 17 05/26/2019 1046   CREATININE 1.09 05/26/2019 1046   CALCIUM 9.3 05/26/2019 1046   PROT 6.9 05/26/2019 1046   ALBUMIN 4.2 05/26/2019 1046   AST 36 05/26/2019 1046   ALT 30 05/26/2019 1046   ALKPHOS 98 05/26/2019 1046   BILITOT 0.4 05/26/2019 1046       Component Value Date/Time   WBC 6.9 05/26/2019 1046   RBC 4.42 05/26/2019 1046   HGB  12.6 05/26/2019 1046   HCT 38.7 05/26/2019 1046   PLT 209.0 05/26/2019 1046   MCV 87.6 05/26/2019 1046   MCHC 32.5 05/26/2019 1046   RDW 14.6 05/26/2019 1046   LYMPHSABS 2.0 05/26/2019 1046   MONOABS 0.6 05/26/2019 1046   EOSABS 0.1 05/26/2019 1046   BASOSABS 0.0 05/26/2019 1046    No results found for: POCLITH, LITHIUM   No results found for: PHENYTOIN, PHENOBARB, VALPROATE, CBMZ   .res Assessment: Plan:    Plan:  1. Wellbutrin SR 150mg  in the am  2. Cymbalta 60mg  BID 3. Trazadone 100mg  - 2 at hs  4. Vraylar 3mg  daily   Continue therapy  RTC 4 weeks  Patient advised to contact office with any questions, adverse effects, or acute worsening in signs and symptoms.  Discussed potential metabolic side effects associated with atypical antipsychotics, as well as potential risk for movement side effects. Advised pt to contact office if movement side effects occur.    Diagnoses and all orders for this visit:  Generalized anxiety disorder -     DULoxetine (CYMBALTA) 60 MG capsule; TAKE 1 CAPSULE BY MOUTH 2 TIMES DAILY -     buPROPion (WELLBUTRIN SR) 150 MG 12 hr tablet;  Take 1 tablet (150 mg total) by mouth daily. Wellbutrin 150XL one tab daily -     cariprazine (VRAYLAR) capsule; Take 1 capsule (3 mg total) by mouth daily.  PTSD (post-traumatic stress disorder) -     DULoxetine (CYMBALTA) 60 MG capsule; TAKE 1 CAPSULE BY MOUTH 2 TIMES DAILY -     cariprazine (VRAYLAR) capsule; Take 1 capsule (3 mg total) by mouth daily.  Insomnia, unspecified type -     DULoxetine (CYMBALTA) 60 MG capsule; TAKE 1 CAPSULE BY MOUTH 2 TIMES DAILY -     traZODone (DESYREL) 100 MG tablet; Take 2 tablets (200 mg total) by mouth at bedtime. -     cariprazine (VRAYLAR) capsule; Take 1 capsule (3 mg total) by mouth daily.  Major depressive disorder, recurrent episode, moderate (HCC) -     DULoxetine (CYMBALTA) 60 MG capsule; TAKE 1 CAPSULE BY MOUTH 2 TIMES DAILY -     buPROPion (WELLBUTRIN SR) 150  MG 12 hr tablet; Take 1 tablet (150 mg total) by mouth daily. Wellbutrin 150XL one tab daily -     traZODone (DESYREL) 100 MG tablet; Take 2 tablets (200 mg total) by mouth at bedtime. -     cariprazine (VRAYLAR) capsule; Take 1 capsule (3 mg total) by mouth daily.    Please [name] After Visit Summary for patient specific instructions.  Future Appointments  Date Time Provider [place]  11/17/2019  2:00 PM Cottle, Lucious Groves, LCSW [facility] None  11/24/2019  3:00 PM Cottle, Bambi G, LCSW [facility] None  12/01/2019  2:00 PM Cottle, Bambi G, LCSW [facility] None  12/08/2019  3:00 PM Cottle, Bambi G, LCSW [facility] None  12/15/2019  2:00 PM Cottle, Bambi G, LCSW [facility] None  12/22/2019  3:00 PM Cottle, Bambi G, LCSW [facility] None  01/05/2020  3:00 PM Cottle, Bambi G, LCSW [facility] None  01/19/2020  3:00 PM Cottle, Bambi G, LCSW [facility] None  02/02/2020  3:00 PM Cottle, Bambi G, LCSW [facility] None  02/16/2020  3:00 PM Cottle, Bambi G, LCSW [facility] None  03/01/2020  3:00 PM Cottle, Bambi G, LCSW [facility] None  03/15/2020  3:00 PM Cottle, Bambi G, LCSW [facility] None    No orders of the defined types were placed in this encounter.     -------------------------------

## 2019-11-17 ENCOUNTER — Ambulatory Visit: Payer: PPO | Admitting: Psychology

## 2019-11-17 ENCOUNTER — Other Ambulatory Visit: Payer: Self-pay | Admitting: Adult Health

## 2019-11-17 DIAGNOSIS — F331 Major depressive disorder, recurrent, moderate: Secondary | ICD-10-CM

## 2019-11-17 MED ORDER — BUPROPION HCL ER (XL) 150 MG PO TB24: 150.0000 mg | ORAL_TABLET | Freq: Every day | ORAL | 1 refills | Status: DC

## 2019-11-17 NOTE — Telephone Encounter (Signed)
Called and spoke with pharmacist.

## 2019-11-17 NOTE — Telephone Encounter (Signed)
[facility] called to verify Paige [name]'s prescription for Wellbutrin.  They said she has always been on the 150mg  XL 24 hr tablet, but the prescription they received was for 150-mg SR 12 hr tablet.  Which should it be?  Please call [phone] - Mickel Baas

## 2019-11-18 DIAGNOSIS — G43009 Migraine without aura, not intractable, without status migrainosus: Secondary | ICD-10-CM | POA: Diagnosis not present

## 2019-11-18 DIAGNOSIS — G894 Chronic pain syndrome: Secondary | ICD-10-CM | POA: Diagnosis not present

## 2019-11-18 DIAGNOSIS — F4310 Post-traumatic stress disorder, unspecified: Secondary | ICD-10-CM | POA: Diagnosis not present

## 2019-11-18 DIAGNOSIS — F332 Major depressive disorder, recurrent severe without psychotic features: Secondary | ICD-10-CM | POA: Diagnosis not present

## 2019-11-18 DIAGNOSIS — G4733 Obstructive sleep apnea (adult) (pediatric): Secondary | ICD-10-CM | POA: Diagnosis not present

## 2019-11-19 DIAGNOSIS — Z1231 Encounter for screening mammogram for malignant neoplasm of breast: Secondary | ICD-10-CM | POA: Diagnosis not present

## 2019-11-22 DIAGNOSIS — Z20828 Contact with and (suspected) exposure to other viral communicable diseases: Secondary | ICD-10-CM | POA: Diagnosis not present

## 2019-11-22 DIAGNOSIS — Z01812 Encounter for preprocedural laboratory examination: Secondary | ICD-10-CM | POA: Diagnosis not present

## 2019-11-22 DIAGNOSIS — Z01818 Encounter for other preprocedural examination: Secondary | ICD-10-CM | POA: Diagnosis not present

## 2019-11-22 DIAGNOSIS — M4714 Other spondylosis with myelopathy, thoracic region: Secondary | ICD-10-CM | POA: Diagnosis not present

## 2019-11-22 DIAGNOSIS — M4710 Other spondylosis with myelopathy, site unspecified: Secondary | ICD-10-CM | POA: Diagnosis not present

## 2019-11-22 DIAGNOSIS — G894 Chronic pain syndrome: Secondary | ICD-10-CM | POA: Diagnosis not present

## 2019-11-24 ENCOUNTER — Ambulatory Visit: Payer: PPO | Admitting: Psychology

## 2019-11-28 NOTE — Progress Notes (Signed)
Subjective:  Patient ID: Paige [name], female    DOB: 03/20/1966,  MRN: [id number]  Chief Complaint  Patient presents with  . Numbness    BL: foot numnbess and tingling x >1 yr; 2-3/10 soreness -w/ swelling and buring -worse in toes -worse with walking Tx: gaba     53 y.o. female presents with the above complaint. Hx as above. Referred for epidermal nerve fiber testing. Pending insertion of spinal cord stimulator.  Review of Systems: Negative except as noted in the HPI. Denies N/V/F/Ch.  Past Medical History:  Diagnosis Date  . Allergic rhinitis   . Anemia   . Anxiety disorder   . Chronic fatigue   . Chronic headaches   . Chronic pain   . Depression   . Endometriosis   . Family history of colon cancer    mother,uncles,aunts  . Fibromyalgia   . Gallstones   . GERD (gastroesophageal reflux disease)   . Hypothyroidism   . IBS (irritable bowel syndrome)   . Insomnia   . Iron deficiency   . Laryngopharyngeal reflux (LPR)   . Obesity   . Personal history of colonic polyps 04/2006   polypoid  . PONV (postoperative nausea and vomiting)   . RLS (restless legs syndrome)     Current Outpatient Medications:  .  buPROPion (WELLBUTRIN SR) 150 MG 12 hr tablet, Take 1 tablet (150 mg total) by mouth daily. Wellbutrin 150XL one tab daily, Disp: 30 tablet, Rfl: 50 .  buPROPion (WELLBUTRIN XL) 150 MG 24 hr tablet, Take 1 tablet (150 mg total) by mouth daily., Disp: 90 tablet, Rfl: 1 .  cariprazine (VRAYLAR) capsule, Take 1 capsule (1.5 mg total) by mouth daily., Disp: 28 capsule, Rfl: 0 .  cariprazine (VRAYLAR) capsule, Take 1 capsule (3 mg total) by mouth daily., Disp: 28 capsule, Rfl: 0 .  cetirizine (ZYRTEC) 10 MG tablet, Take 1 tablet (10 mg total) by mouth daily., Disp: 1 tablet, Rfl: 0 .  dicyclomine (BENTYL) 10 MG capsule, Take 1 capsule (10 mg total) by mouth 3 (three) times daily as needed for spasms., Disp: 90 capsule, Rfl: 11 .  DULoxetine (CYMBALTA) 60 MG capsule, TAKE 1  CAPSULE BY MOUTH 2 TIMES DAILY, Disp: 60 capsule, Rfl: 5 .  Erenumab-aooe (AIMOVIG) 70 MG/ML SOAJ, Inject 70 mg into the skin every 28 (twenty-eight) days., Disp: 1 pen, Rfl: 11 .  Ferrous Sulfate Dried (SLOW RELEASE IRON) 45 MG TBCR, Take 1 tablet by mouth daily., Disp: , Rfl:  .  HYDROcodone-acetaminophen (NORCO) 10-325 MG tablet, Take 1 tablet by mouth every 6 (six) hours as needed., Disp: , Rfl:  .  ipratropium (ATROVENT) 0.03 % nasal spray, Place 2 sprays into both nostrils as needed. , Disp: 30 mL, Rfl: 12 .  lamoTRIgine (LAMICTAL) 25 MG tablet, Take 25 mg by mouth daily., Disp: , Rfl:  .  levothyroxine (SYNTHROID, LEVOTHROID) 88 MCG tablet, Take 88 mcg by mouth daily before breakfast., Disp: , Rfl:  .  nystatin cream (MYCOSTATIN), Apply 1 application topically daily as needed for dry skin., Disp: , Rfl:  .  olopatadine (PATANOL) 0.1 % ophthalmic solution, Place 1 drop into both eyes daily as needed for allergies. , Disp: , Rfl:  .  omeprazole (PRILOSEC) 20 MG capsule, , Disp: , Rfl:  .  ondansetron (ZOFRAN-ODT) 4 MG disintegrating tablet, Take 4 mg by mouth every 8 (eight) hours as needed for nausea or vomiting., Disp: , Rfl:  .  Polyethyl Glycol-Propyl Glycol (SYSTANE) 0.4-0.3 % SOLN,  Apply 1 drop to eye as needed., Disp: , Rfl:  .  polyethylene glycol (MIRALAX / GLYCOLAX) 17 g packet, Take 17 g by mouth 2 (two) times daily., Disp: , Rfl:  .  potassium chloride (K-DUR,KLOR-CON) 10 MEQ tablet, Take 1 tablet by mouth daily., Disp: , Rfl:  .  promethazine (PHENERGAN) 12.5 MG tablet, Take 1 tablet (12.5 mg total) by mouth every 8 (eight) hours as needed for nausea or vomiting., Disp: 30 tablet, Rfl: 0 .  rizatriptan (MAXALT) 10 MG tablet, Take 1 tablet earliest onset of migraine.  May repeat once in 2 hours if needed, Disp: 10 tablet, Rfl: 3 .  rOPINIRole (REQUIP) 2 MG tablet, Take 2 mg by mouth 2 (two) times daily., Disp: , Rfl:  .  terconazole (TERAZOL 3) 0.8 % vaginal cream, Place 1  applicator vaginally at bedtime., Disp: , Rfl:  .  torsemide (DEMADEX) 100 MG tablet, Take 50 mg by mouth daily. , Disp: , Rfl:  .  traZODone (DESYREL) 100 MG tablet, Take 2 tablets (200 mg total) by mouth at bedtime., Disp: 60 tablet, Rfl: 5 .  Ubrogepant (UBRELVY) 100 MG TABS, Take 1 tablet by mouth daily as needed (at onset of headaches may repeat dose after 2 hours as needed max 200 mg in 24 hours)., Disp: 16 tablet, Rfl: 3  Current Facility-Administered Medications:  .  0.9 %  sodium chloride infusion, 500 mL, Intravenous, Once, Ladene Artist, MD  Social History   Tobacco Use  Smoking Status Never Smoker  Smokeless Tobacco Never Used    Allergies  Allergen Reactions  . Oxycodone-Acetaminophen Hives  . Topiramate Shortness Of Breath and Other (See Comments)    Really wierd feeling Off balance; cloudy thinking Really wierd feeling  . Cyclobenzaprine Other (See Comments)    Other reaction(s): Unknown Hallucinations Unknown   . Meloxicam Other (See Comments)  . Morphine     Other reaction(s): Unknown   . Naltrexone Other (See Comments)  . Pork (Porcine) Protein Other (See Comments)    Other reaction(s): Other (See Comments) Migraines Other reaction(s): Other (See Comments) Migraines  . Sumatriptan Other (See Comments)    Difficulty breathing Other reaction(s): Other (see comments) Off balance   . Tape Dermatitis  . Flexeril [Cyclobenzaprine Hcl]   . Imitrex [Sumatriptan Base]   . Morphine And Related   . Oxycodone Hcl   . Percocet [Oxycodone-Acetaminophen]    Objective:  There were no vitals filed for this visit. There is no height or weight on file to calculate BMI. Constitutional Well developed. Well nourished.  Vascular Dorsalis pedis pulses palpable bilaterally. Posterior tibial pulses palpable bilaterally. Capillary refill normal to all digits.  No cyanosis or clubbing noted. Pedal hair growth normal.  Neurologic Normal speech. Oriented to person,  place, and time. Epicritic sensation to light touch grossly present diminished.  Dermatologic Nails normal Skin normal  Orthopedic: Normal joint ROM without pain or crepitus bilaterally. No visible deformities. No bony tenderness.   Radiographs: None Assessment:   1. Neuritis   2. Neuropathy   3. Pain in both feet   4. Benign skin lesion    Plan:  Patient was evaluated and treated and all questions answered.  Peripheral Neuropathy -Skin Biopsy x2 to evaluate for Neuropathy performed as below  Procedure: Punch Biopsy Indication: left ankle x2 Anesthesia: Lidocaine 1% plain; 64mL Instrumentation: 3 mm punch biopsy Technique: Sharp excisional debridement to bleeding, viable wound base.  Dressing: Dry, sterile, compression dressing. Specimen: Labeled and sent for  pathology. Disposition: Patient tolerated procedure well. Leave dressing on for 48 hours. Thereafter dress with antibiotic ointment and band-aid. Off-loading pads dispensed. Patient to return in 1 week for follow-up.  Return in about 2 weeks (around 10/26/2019) for Biopsy f/u .

## 2019-11-29 DIAGNOSIS — E669 Obesity, unspecified: Secondary | ICD-10-CM | POA: Diagnosis not present

## 2019-11-29 DIAGNOSIS — M545 Low back pain: Secondary | ICD-10-CM | POA: Diagnosis not present

## 2019-11-29 DIAGNOSIS — R0602 Shortness of breath: Secondary | ICD-10-CM | POA: Diagnosis not present

## 2019-11-29 DIAGNOSIS — Z6833 Body mass index (BMI) 33.0-33.9, adult: Secondary | ICD-10-CM | POA: Diagnosis not present

## 2019-11-29 DIAGNOSIS — G894 Chronic pain syndrome: Secondary | ICD-10-CM | POA: Diagnosis not present

## 2019-11-29 DIAGNOSIS — M4715 Other spondylosis with myelopathy, thoracolumbar region: Secondary | ICD-10-CM | POA: Diagnosis not present

## 2019-11-29 DIAGNOSIS — M4726 Other spondylosis with radiculopathy, lumbar region: Secondary | ICD-10-CM | POA: Diagnosis not present

## 2019-11-29 DIAGNOSIS — M5416 Radiculopathy, lumbar region: Secondary | ICD-10-CM | POA: Diagnosis not present

## 2019-11-29 DIAGNOSIS — Z969 Presence of functional implant, unspecified: Secondary | ICD-10-CM | POA: Diagnosis not present

## 2019-11-29 DIAGNOSIS — G4730 Sleep apnea, unspecified: Secondary | ICD-10-CM | POA: Diagnosis not present

## 2019-11-29 DIAGNOSIS — Z9981 Dependence on supplemental oxygen: Secondary | ICD-10-CM | POA: Diagnosis not present

## 2019-11-29 DIAGNOSIS — M4716 Other spondylosis with myelopathy, lumbar region: Secondary | ICD-10-CM | POA: Diagnosis not present

## 2019-11-30 DIAGNOSIS — M4726 Other spondylosis with radiculopathy, lumbar region: Secondary | ICD-10-CM | POA: Diagnosis not present

## 2019-11-30 DIAGNOSIS — M5116 Intervertebral disc disorders with radiculopathy, lumbar region: Secondary | ICD-10-CM | POA: Diagnosis not present

## 2019-11-30 DIAGNOSIS — M47814 Spondylosis without myelopathy or radiculopathy, thoracic region: Secondary | ICD-10-CM | POA: Diagnosis not present

## 2019-12-01 ENCOUNTER — Ambulatory Visit (INDEPENDENT_AMBULATORY_CARE_PROVIDER_SITE_OTHER): Payer: PPO | Admitting: Psychology

## 2019-12-01 DIAGNOSIS — F4310 Post-traumatic stress disorder, unspecified: Secondary | ICD-10-CM

## 2019-12-07 DIAGNOSIS — G8918 Other acute postprocedural pain: Secondary | ICD-10-CM | POA: Diagnosis not present

## 2019-12-08 ENCOUNTER — Ambulatory Visit (INDEPENDENT_AMBULATORY_CARE_PROVIDER_SITE_OTHER): Payer: PPO | Admitting: Psychology

## 2019-12-08 DIAGNOSIS — F4310 Post-traumatic stress disorder, unspecified: Secondary | ICD-10-CM

## 2019-12-10 DIAGNOSIS — F4310 Post-traumatic stress disorder, unspecified: Secondary | ICD-10-CM | POA: Diagnosis not present

## 2019-12-10 DIAGNOSIS — G894 Chronic pain syndrome: Secondary | ICD-10-CM | POA: Diagnosis not present

## 2019-12-10 DIAGNOSIS — Z79899 Other long term (current) drug therapy: Secondary | ICD-10-CM | POA: Diagnosis not present

## 2019-12-10 DIAGNOSIS — F330 Major depressive disorder, recurrent, mild: Secondary | ICD-10-CM | POA: Diagnosis not present

## 2019-12-15 ENCOUNTER — Ambulatory Visit (INDEPENDENT_AMBULATORY_CARE_PROVIDER_SITE_OTHER): Payer: PPO | Admitting: Psychology

## 2019-12-15 DIAGNOSIS — F4310 Post-traumatic stress disorder, unspecified: Secondary | ICD-10-CM | POA: Diagnosis not present

## 2019-12-19 DIAGNOSIS — G43009 Migraine without aura, not intractable, without status migrainosus: Secondary | ICD-10-CM | POA: Diagnosis not present

## 2019-12-19 DIAGNOSIS — G4733 Obstructive sleep apnea (adult) (pediatric): Secondary | ICD-10-CM | POA: Diagnosis not present

## 2019-12-21 ENCOUNTER — Ambulatory Visit (INDEPENDENT_AMBULATORY_CARE_PROVIDER_SITE_OTHER): Payer: PPO | Admitting: Adult Health

## 2019-12-21 ENCOUNTER — Encounter: Payer: Self-pay | Admitting: Adult Health

## 2019-12-21 DIAGNOSIS — G4700 Insomnia, unspecified: Secondary | ICD-10-CM

## 2019-12-21 DIAGNOSIS — F411 Generalized anxiety disorder: Secondary | ICD-10-CM

## 2019-12-21 DIAGNOSIS — F4310 Post-traumatic stress disorder, unspecified: Secondary | ICD-10-CM | POA: Diagnosis not present

## 2019-12-21 DIAGNOSIS — F331 Major depressive disorder, recurrent, moderate: Secondary | ICD-10-CM

## 2019-12-21 NOTE — Progress Notes (Signed)
Paige [name] [id number] 03/24/1966 54 y.o.  Virtual Visit via Telephone Note  I connected with pt on 12/21/19 at  1:20 PM EST by telephone and verified that I am speaking with the correct person using two identifiers.   I discussed the limitations, risks, security and privacy concerns of performing an evaluation and management service by telephone and the availability of in person appointments. I also discussed with the patient that there may be a patient responsible charge related to this service. The patient expressed understanding and agreed to proceed.   I discussed the assessment and treatment plan with the patient. The patient was provided an opportunity to ask questions and all were answered. The patient agreed with the plan and demonstrated an understanding of the instructions.   The patient was advised to call back or seek an in-person evaluation if the symptoms worsen or if the condition fails to improve as anticipated.  I provided 30 minutes of non-face-to-face time during this encounter.  The patient was located at home.  The provider was located at [place].   Aloha Gell, NP   Subjective:   Patient ID:  Paige [name] is a 54 y.o. (DOB 01-Nov-1966) female.  Chief Complaint:  Chief Complaint  Patient presents with  . Anxiety  . Depression  . Insomnia  . Trauma    HPI [name] presents for follow-up of GAD, MDD, PTSD, insomnia.  Describes mood today as "ok". Pleasant. Mood symptoms - reports decreased depression, anxiety, and irritability. Stating "I'm doing alright". Recent spinal cord stimulator placement. Having increased "soreness" at incision sites. Stating "it's a real bad ache". Has to walk 30 minutes twice a day. Wanting to do things, but "can't". Has trouble washing dishes. Has been able to shower on her own. Feels like Vraylar has caused weight gain - "is eating better and walking as much as she can". Feels like she has  gained about 10 to 12 pounds. Has reduced the Vraylar to 1.5mg  and is doing "ok for now". Has been staying with mother up until last night. Is back home now - with 3 cats. Decreased interest and motivation. Continues to see therapist Chiropractor) every week for PTSD - starting EMDR tomorrow. Taking medications as prescribed.  Energy levels improving. Active, unable to establish a regular exercise routine with current disabilities. Disabled since 2009. Enjoys some usual interests and activities. Single. Lives alone with 3 cats. Mother local and supportive. Appetite decreased. Weight increased - 10 pounds. Sleeps better some nights than others. Averages 4 to 5 hours of broken sleep. Naps during the day. Using CPAP machine.  Focus and concentration difficulties. Completing some household tasks. Manages some household tasks.  Denies SI or HI. Denies AH or VH.  Previous medication trials: Lexapro, Paxil, Trazadone, Wellbutrin SR, Geodon, Seroquel, Risperdal, Depakote, Topamax, Lithium, Provigil, Ambien, Cytomel, Clonazepam and other benzodixepaines, Elavil, Effexor, remeron, Prozac, Anafranil, Tofranil, Zoloft, Geodon, Buspar, Adderall, Ritalin, Abilify, Rexulti, Lamictal, Latuda  Other treatments: ECT - 18 treatments - significant memory loss.   Review of Systems:  Review of Systems  Musculoskeletal: Negative for gait problem.  Neurological: Negative for tremors.  Psychiatric/Behavioral:       Please refer to HPI     Medications: I have reviewed the patient's current medications.  Current Outpatient Medications  Medication Sig Dispense Refill  . buPROPion (WELLBUTRIN SR) 150 MG 12 hr tablet Take 1 tablet (150 mg total) by mouth daily. Wellbutrin 150XL one tab daily 30 tablet 50  .  buPROPion (WELLBUTRIN XL) 150 MG 24 hr tablet Take 1 tablet (150 mg total) by mouth daily. 90 tablet 1  . cariprazine (VRAYLAR) capsule Take 1 capsule (1.5 mg total) by mouth daily. 28 capsule 0  . cariprazine  (VRAYLAR) capsule Take 1 capsule (3 mg total) by mouth daily. 28 capsule 0  . cetirizine (ZYRTEC) 10 MG tablet Take 1 tablet (10 mg total) by mouth daily. 1 tablet 0  . dicyclomine (BENTYL) 10 MG capsule Take 1 capsule (10 mg total) by mouth 3 (three) times daily as needed for spasms. 90 capsule 11  . DULoxetine (CYMBALTA) 60 MG capsule TAKE 1 CAPSULE BY MOUTH 2 TIMES DAILY 60 capsule 5  . Erenumab-aooe (AIMOVIG) 70 MG/ML SOAJ Inject 70 mg into the skin every 28 (twenty-eight) days. 1 pen 11  . Ferrous Sulfate Dried (SLOW RELEASE IRON) 45 MG TBCR Take 1 tablet by mouth daily.    Marland Kitchen HYDROcodone-acetaminophen (NORCO) 10-325 MG tablet Take 1 tablet by mouth every 6 (six) hours as needed.    Marland Kitchen ipratropium (ATROVENT) 0.03 % nasal spray Place 2 sprays into both nostrils as needed.  30 mL 12  . lamoTRIgine (LAMICTAL) 25 MG tablet Take 25 mg by mouth daily.    Marland Kitchen levothyroxine (SYNTHROID, LEVOTHROID) 88 MCG tablet Take 88 mcg by mouth daily before breakfast.    . nystatin cream (MYCOSTATIN) Apply 1 application topically daily as needed for dry skin.    Marland Kitchen olopatadine (PATANOL) 0.1 % ophthalmic solution Place 1 drop into both eyes daily as needed for allergies.     Marland Kitchen omeprazole (PRILOSEC) 20 MG capsule     . ondansetron (ZOFRAN-ODT) 4 MG disintegrating tablet Take 4 mg by mouth every 8 (eight) hours as needed for nausea or vomiting.    Vladimir Faster Glycol-Propyl Glycol (SYSTANE) 0.4-0.3 % SOLN Apply 1 drop to eye as needed.    . polyethylene glycol (MIRALAX / GLYCOLAX) 17 g packet Take 17 g by mouth 2 (two) times daily.    . potassium chloride (K-DUR,KLOR-CON) 10 MEQ tablet Take 1 tablet by mouth daily.    . promethazine (PHENERGAN) 12.5 MG tablet Take 1 tablet (12.5 mg total) by mouth every 8 (eight) hours as needed for nausea or vomiting. 30 tablet 0  . rizatriptan (MAXALT) 10 MG tablet Take 1 tablet earliest onset of migraine.  May repeat once in 2 hours if needed 10 tablet 3  . rOPINIRole (REQUIP) 2 MG  tablet Take 2 mg by mouth 2 (two) times daily.    Marland Kitchen terconazole (TERAZOL 3) 0.8 % vaginal cream Place 1 applicator vaginally at bedtime.    . torsemide (DEMADEX) 100 MG tablet Take 50 mg by mouth daily.     . traZODone (DESYREL) 100 MG tablet Take 2 tablets (200 mg total) by mouth at bedtime. 60 tablet 5  . Ubrogepant (UBRELVY) 100 MG TABS Take 1 tablet by mouth daily as needed (at onset of headaches may repeat dose after 2 hours as needed max 200 mg in 24 hours). 16 tablet 3   Current Facility-Administered Medications  Medication Dose Route Frequency Provider Last Rate Last Admin  . 0.9 %  sodium chloride infusion  500 mL Intravenous Once Ladene Artist, MD        Medication Side Effects: None  Allergies:  Allergies  Allergen Reactions  . Oxycodone-Acetaminophen Hives  . Topiramate Shortness Of Breath and Other (See Comments)    Really wierd feeling Off balance; cloudy thinking Really wierd feeling  .  Cyclobenzaprine Other (See Comments)    Other reaction(s): Unknown Hallucinations Unknown   . Meloxicam Other (See Comments)  . Morphine     Other reaction(s): Unknown   . Naltrexone Other (See Comments)  . Pork (Porcine) Protein Other (See Comments)    Other reaction(s): Other (See Comments) Migraines Other reaction(s): Other (See Comments) Migraines  . Sumatriptan Other (See Comments)    Difficulty breathing Other reaction(s): Other (see comments) Off balance   . Tape Dermatitis  . Flexeril [Cyclobenzaprine Hcl]   . Imitrex [Sumatriptan Base]   . Morphine And Related   . Oxycodone Hcl   . Percocet [Oxycodone-Acetaminophen]     Past Medical History:  Diagnosis Date  . Allergic rhinitis   . Anemia   . Anxiety disorder   . Chronic fatigue   . Chronic headaches   . Chronic pain   . Depression   . Endometriosis   . Family history of colon cancer    mother,uncles,aunts  . Fibromyalgia   . Gallstones   . GERD (gastroesophageal reflux disease)   .  Hypothyroidism   . IBS (irritable bowel syndrome)   . Insomnia   . Iron deficiency   . Laryngopharyngeal reflux (LPR)   . Obesity   . Personal history of colonic polyps 04/2006   polypoid  . PONV (postoperative nausea and vomiting)   . RLS (restless legs syndrome)     Family History  Problem Relation Age of Onset  . Colon cancer Mother        uncles,aunts  . Diabetes Father        paternal grandmother,sister  . Leukemia Father   . Lymphoma Father   . Skin cancer Father   . Heart disease Father   . Diabetes Sister   . Stomach cancer Neg Hx   . Esophageal cancer Neg Hx   . Rectal cancer Neg Hx     Social History   Socioeconomic History  . Marital status: Single    Spouse name: Not on file  . Number of children: 0  . Years of education: Not on file  . Highest education level: Not on file  Occupational History  . Occupation: RN/disabled  Tobacco Use  . Smoking status: Never Smoker  . Smokeless tobacco: Never Used  Substance and Sexual Activity  . Alcohol use: Not Currently    Alcohol/week: 0.0 standard drinks    Comment: rarely  . Drug use: No  . Sexual activity: Not on file  Other Topics Concern  . Not on file  Social History Narrative   Patient is left-handed. She lives alone in a one level home. She has 3 cats. She is unable to exercise. Caffeine one soda / day. On disability now. 2 bachelor degrees   Social Determinants of Health   Financial Resource Strain:   . Difficulty of Paying Living Expenses: Not on file  Food Insecurity:   . Worried About Charity fundraiser in the Last Year: Not on file  . Ran Out of Food in the Last Year: Not on file  Transportation Needs:   . Lack of Transportation (Medical): Not on file  . Lack of Transportation (Non-Medical): Not on file  Physical Activity:   . Days of Exercise per Week: Not on file  . Minutes of Exercise per Session: Not on file  Stress:   . Feeling of Stress : Not on file  Social Connections:   .  Frequency of Communication with Friends and Family: Not on file  .  Frequency of Social Gatherings with Friends and Family: Not on file  . Attends Religious Services: Not on file  . Active Member of Clubs or Organizations: Not on file  . Attends Archivist Meetings: Not on file  . Marital Status: Not on file  Intimate Partner Violence:   . Fear of Current or Ex-Partner: Not on file  . Emotionally Abused: Not on file  . Physically Abused: Not on file  . Sexually Abused: Not on file    Past Medical History, Surgical history, Social history, and Family history were reviewed and updated as appropriate.   Please see review of systems for further details on the patient's review from today.   Objective:   Physical Exam:  There were no vitals taken for this visit.  Physical Exam Neurological:     Mental Status: She is alert and oriented to person, place, and time.     Cranial Nerves: No dysarthria.  Psychiatric:        Attention and Perception: Attention and perception normal.        Mood and Affect: Mood normal.        Speech: Speech normal.        Behavior: Behavior is cooperative.        Thought Content: Thought content normal. Thought content is not paranoid or delusional. Thought content does not include homicidal or suicidal ideation. Thought content does not include homicidal or suicidal plan.        Cognition and Memory: Cognition and memory normal.        Judgment: Judgment normal.     Comments: Insight intact     Lab Review:     Component Value Date/Time   NA 140 05/26/2019 1046   K 3.4 (L) 05/26/2019 1046   CL 95 (L) 05/26/2019 1046   CO2 37 (H) 05/26/2019 1046   GLUCOSE 97 05/26/2019 1046   BUN 17 05/26/2019 1046   CREATININE 1.09 05/26/2019 1046   CALCIUM 9.3 05/26/2019 1046   PROT 6.9 05/26/2019 1046   ALBUMIN 4.2 05/26/2019 1046   AST 36 05/26/2019 1046   ALT 30 05/26/2019 1046   ALKPHOS 98 05/26/2019 1046   BILITOT 0.4 05/26/2019 1046        Component Value Date/Time   WBC 6.9 05/26/2019 1046   RBC 4.42 05/26/2019 1046   HGB 12.6 05/26/2019 1046   HCT 38.7 05/26/2019 1046   PLT 209.0 05/26/2019 1046   MCV 87.6 05/26/2019 1046   MCHC 32.5 05/26/2019 1046   RDW 14.6 05/26/2019 1046   LYMPHSABS 2.0 05/26/2019 1046   MONOABS 0.6 05/26/2019 1046   EOSABS 0.1 05/26/2019 1046   BASOSABS 0.0 05/26/2019 1046    No results found for: POCLITH, LITHIUM   No results found for: PHENYTOIN, PHENOBARB, VALPROATE, CBMZ   .res Assessment: Plan:    Plan:  1. Wellbutrin SR 150mg  in the am  2. Cymbalta 60mg  BID 3. Trazadone 100mg  - 2 at hs  4. Reduce Vraylar 3mg  to 1.5mg  daily   Continue therapy with Bambi Cottle.   RTC 4 weeks  Patient advised to contact office with any questions, adverse effects, or acute worsening in signs and symptoms.  Discussed potential metabolic side effects associated with atypical antipsychotics, as well as potential risk for movement side effects. Advised pt to contact office if movement side effects occur.   Paige [name] was seen today for anxiety, depression, insomnia and trauma.  Diagnoses and all orders for this visit:  Major depressive disorder,  recurrent episode, moderate (HCC)  Insomnia, unspecified type  PTSD (post-traumatic stress disorder)  Generalized anxiety disorder    Please see After Visit Summary for patient specific instructions.  Future Appointments  Date Time Provider [place]  12/22/2019  3:00 PM Cottle, Lucious Groves, LCSW [facility] None  12/29/2019  2:00 PM Cottle, Bambi G, LCSW [facility] None  01/05/2020  3:00 PM Cottle, Bambi G, LCSW [facility] None  01/12/2020  2:00 PM Cottle, Bambi G, LCSW [facility] None  01/19/2020  3:00 PM Cottle, Bambi G, LCSW [facility] None  02/02/2020  3:00 PM Cottle, Bambi G, LCSW [facility] None  02/16/2020  3:00 PM Cottle, Bambi G, LCSW [facility] None  03/01/2020  3:00 PM Cottle, Bambi G, LCSW [facility] None  03/15/2020  3:00 PM Cottle, Bambi G, LCSW  [facility] None    No orders of the defined types were placed in this encounter.     -------------------------------

## 2019-12-22 ENCOUNTER — Ambulatory Visit (INDEPENDENT_AMBULATORY_CARE_PROVIDER_SITE_OTHER): Payer: PPO | Admitting: Psychology

## 2019-12-22 DIAGNOSIS — F4310 Post-traumatic stress disorder, unspecified: Secondary | ICD-10-CM | POA: Diagnosis not present

## 2019-12-23 DIAGNOSIS — M797 Fibromyalgia: Secondary | ICD-10-CM | POA: Diagnosis not present

## 2019-12-23 DIAGNOSIS — M654 Radial styloid tenosynovitis [de Quervain]: Secondary | ICD-10-CM | POA: Diagnosis not present

## 2019-12-23 DIAGNOSIS — M549 Dorsalgia, unspecified: Secondary | ICD-10-CM | POA: Diagnosis not present

## 2019-12-23 DIAGNOSIS — Z6834 Body mass index (BMI) 34.0-34.9, adult: Secondary | ICD-10-CM | POA: Diagnosis not present

## 2019-12-23 DIAGNOSIS — E039 Hypothyroidism, unspecified: Secondary | ICD-10-CM | POA: Diagnosis not present

## 2019-12-23 DIAGNOSIS — G4733 Obstructive sleep apnea (adult) (pediatric): Secondary | ICD-10-CM | POA: Diagnosis not present

## 2019-12-28 DIAGNOSIS — M545 Low back pain: Secondary | ICD-10-CM | POA: Diagnosis not present

## 2019-12-29 ENCOUNTER — Ambulatory Visit (INDEPENDENT_AMBULATORY_CARE_PROVIDER_SITE_OTHER): Payer: PPO | Admitting: Psychology

## 2019-12-29 DIAGNOSIS — F4310 Post-traumatic stress disorder, unspecified: Secondary | ICD-10-CM

## 2019-12-31 ENCOUNTER — Encounter: Payer: Self-pay | Admitting: *Deleted

## 2019-12-31 NOTE — Progress Notes (Signed)
Paige [name] (Key: [id number]) Aimovig 70MG /ML auto-injectors   Form [employer] 4-Part [place] Electronic [place] Form Created 26 days ago Sent to Plan Determination Favorable 6 minutes ago

## 2020-01-04 DIAGNOSIS — G894 Chronic pain syndrome: Secondary | ICD-10-CM | POA: Diagnosis not present

## 2020-01-04 DIAGNOSIS — F4310 Post-traumatic stress disorder, unspecified: Secondary | ICD-10-CM | POA: Diagnosis not present

## 2020-01-04 DIAGNOSIS — F330 Major depressive disorder, recurrent, mild: Secondary | ICD-10-CM | POA: Diagnosis not present

## 2020-01-05 ENCOUNTER — Ambulatory Visit (INDEPENDENT_AMBULATORY_CARE_PROVIDER_SITE_OTHER): Payer: PPO | Admitting: Psychology

## 2020-01-05 DIAGNOSIS — F4310 Post-traumatic stress disorder, unspecified: Secondary | ICD-10-CM

## 2020-01-12 ENCOUNTER — Ambulatory Visit (INDEPENDENT_AMBULATORY_CARE_PROVIDER_SITE_OTHER): Payer: PPO | Admitting: Psychology

## 2020-01-12 DIAGNOSIS — F4310 Post-traumatic stress disorder, unspecified: Secondary | ICD-10-CM

## 2020-01-18 ENCOUNTER — Ambulatory Visit (INDEPENDENT_AMBULATORY_CARE_PROVIDER_SITE_OTHER): Payer: PPO | Admitting: Adult Health

## 2020-01-18 ENCOUNTER — Other Ambulatory Visit: Payer: Self-pay

## 2020-01-18 ENCOUNTER — Encounter: Payer: Self-pay | Admitting: Adult Health

## 2020-01-18 DIAGNOSIS — F4310 Post-traumatic stress disorder, unspecified: Secondary | ICD-10-CM | POA: Diagnosis not present

## 2020-01-18 DIAGNOSIS — F39 Unspecified mood [affective] disorder: Secondary | ICD-10-CM

## 2020-01-18 DIAGNOSIS — F411 Generalized anxiety disorder: Secondary | ICD-10-CM | POA: Diagnosis not present

## 2020-01-18 DIAGNOSIS — F331 Major depressive disorder, recurrent, moderate: Secondary | ICD-10-CM

## 2020-01-18 DIAGNOSIS — G4700 Insomnia, unspecified: Secondary | ICD-10-CM | POA: Diagnosis not present

## 2020-01-18 NOTE — Progress Notes (Signed)
Paige [name] [id number] 12-12-1965 54 y.o.  Subjective:   Patient ID:  Paige [name] is a 54 y.o. (DOB Dec 30, 1965) female.  Chief Complaint: No chief complaint on file.   HPI Paige [name] presents to the office today for follow-up of GAD, MDD, PTSD, insomnia.  Describes mood today as "ok". Pleasant. Mood symptoms - reports decreased depression, anxiety, and irritability. Stating - things are going better for me".  More depressed when by herself a lot. Gets anxious when can't figure out what to do - "walking has helped". Spinal cord stimulator "helping a lot". Is able to do more for herself. Concerned about what gain - "will start tracking". Happy to be back home with 3 cats. Increased interest and motivation. Continues to see therapist Chiropractor) every week for PTSD - started EMDR. Feels like it was hepful. Taking medications as prescribed.  Energy levels stable. Active, walking daily. Averages 4 miles. Walking at the mall - has met 2 other ladies to walk. Disabled since 2009. Enjoys some usual interests and activities. Single. Lives alone with 3 cats. Mother local and supportive. Appetite adequate. Weight gain - 236 pounds.  Sleeping better at night. Averages 6 to 8 hours. CPAP machine taken back for non-compliance.  Focus and concentration difficulties. Completing some household tasks. Manages some household tasks. Disabled. Denies SI or HI. Denies AH or VH.  Previous medication trials: Lexapro, Paxil, Trazadone, Wellbutrin SR, Geodon, Seroquel, Risperdal, Depakote, Topamax, Lithium, Provigil, Ambien, Cytomel, Clonazepam and other benzodixepaines, Elavil, Effexor, remeron, Prozac, Anafranil, Tofranil, Zoloft, Geodon, Buspar, Adderall, Ritalin, Abilify, Rexulti, Lamictal, Latuda   Mini-Mental     Office Visit from 04/07/2018 in [facility]  Total Score (max 30 points )  29    PHQ2-9     Patient Outreach from 05/22/2018 in [facility]  Patient Outreach from 05/13/2018 in [facility] Patient Outreach Telephone from 04/30/2018 in [facility]  PHQ-2 Total Score  '5  5  2  ' PHQ-9 Total Score  '20  18  13       ' Review of Systems:  Review of Systems  Musculoskeletal: Negative for gait problem.  Neurological: Negative for tremors.  Psychiatric/Behavioral:       Please refer to HPI    Medications: I have reviewed the patient's current medications.  Current Outpatient Medications  Medication Sig Dispense Refill  . buPROPion (WELLBUTRIN XL) 150 MG 24 hr tablet Take by mouth.    . lamoTRIgine (LAMICTAL) 25 MG tablet Take by mouth.    . potassium chloride (KLOR-CON) 10 MEQ tablet Take by mouth.    . cariprazine (VRAYLAR) capsule Take 1 capsule (1.5 mg total) by mouth daily. 28 capsule 0  . dicyclomine (BENTYL) 10 MG capsule Take 1 capsule (10 mg total) by mouth 3 (three) times daily as needed for spasms. 90 capsule 11  . DULoxetine (CYMBALTA) 60 MG capsule TAKE 1 CAPSULE BY MOUTH 2 TIMES DAILY 60 capsule 5  . Erenumab-aooe (AIMOVIG) 70 MG/ML SOAJ Inject 70 mg into the skin every 28 (twenty-eight) days. 1 pen 11  . Fe Fum-Fe Poly-Vit C-Lactobac (FUSION) 65-65-25-30 MG CAPS Take by mouth.    . Ferrous Sulfate Dried (SLOW RELEASE IRON) 45 MG TBCR Take 1 tablet by mouth daily.    Marland Kitchen ipratropium (ATROVENT) 0.03 % nasal spray Place 2 sprays into both nostrils as needed.  30 mL 12  . ipratropium (ATROVENT) 0.06 % nasal spray INSTILL 2 SPRAYS INTO EACH NOSTRIL AS NEEDED TWICE A  DAY    . ketoconazole (NIZORAL) 2 % shampoo Apply topically.    Marland Kitchen levothyroxine (SYNTHROID, LEVOTHROID) 88 MCG tablet Take 88 mcg by mouth daily before breakfast.    . methocarbamol (ROBAXIN) 500 MG tablet Take 500 mg by mouth 3 (three) times daily.    Marland Kitchen nystatin cream (MYCOSTATIN) Apply 1 application topically daily as needed for dry skin.    Marland Kitchen omeprazole (PRILOSEC) 20 MG capsule     . ondansetron (ZOFRAN-ODT) 4 MG disintegrating tablet  Take 4 mg by mouth every 8 (eight) hours as needed for nausea or vomiting.    . Oxycodone HCl 10 MG TABS Take 10 mg by mouth every 6 (six) hours as needed.    Vladimir Faster Glycol-Propyl Glycol (SYSTANE) 0.4-0.3 % SOLN Apply 1 drop to eye as needed.    . polyethylene glycol (MIRALAX / GLYCOLAX) 17 g packet Take 17 g by mouth 2 (two) times daily.    . potassium chloride (K-DUR,KLOR-CON) 10 MEQ tablet Take 1 tablet by mouth daily.    . promethazine (PHENERGAN) 12.5 MG tablet Take 1 tablet (12.5 mg total) by mouth every 8 (eight) hours as needed for nausea or vomiting. 30 tablet 0  . rOPINIRole (REQUIP) 2 MG tablet Take 2 mg by mouth 2 (two) times daily.    Marland Kitchen terconazole (TERAZOL 3) 0.8 % vaginal cream Place 1 applicator vaginally at bedtime.    Marland Kitchen tiZANidine (ZANAFLEX) 2 MG tablet Take 2 mg by mouth 3 (three) times daily.    Marland Kitchen torsemide (DEMADEX) 20 MG tablet Take 20 mg by mouth daily.    . traZODone (DESYREL) 100 MG tablet Take 2 tablets (200 mg total) by mouth at bedtime. 60 tablet 5  . Ubrogepant (UBRELVY) 100 MG TABS Take 1 tablet by mouth daily as needed (at onset of headaches may repeat dose after 2 hours as needed max 200 mg in 24 hours). 16 tablet 3   Current Facility-Administered Medications  Medication Dose Route Frequency Provider Last Rate Last Admin  . 0.9 %  sodium chloride infusion  500 mL Intravenous Once Ladene Artist, MD        Medication Side Effects: None  Allergies:  Allergies  Allergen Reactions  . Oxycodone-Acetaminophen Hives  . Topiramate Shortness Of Breath and Other (See Comments)    Really wierd feeling Off balance; cloudy thinking Really wierd feeling  . Cyclobenzaprine Other (See Comments)    Other reaction(s): Unknown Hallucinations Unknown   . Meloxicam Other (See Comments)  . Morphine     Other reaction(s): Unknown   . Naltrexone Other (See Comments)  . Pork (Porcine) Protein Other (See Comments)    Other reaction(s): Other (See  Comments) Migraines Other reaction(s): Other (See Comments) Migraines  . Sumatriptan Other (See Comments)    Difficulty breathing Other reaction(s): Other (see comments) Off balance   . Tape Dermatitis  . Flexeril [Cyclobenzaprine Hcl]   . Imitrex [Sumatriptan Base]   . Morphine And Related   . Oxycodone Hcl   . Percocet [Oxycodone-Acetaminophen]     Past Medical History:  Diagnosis Date  . Allergic rhinitis   . Anemia   . Anxiety disorder   . Chronic fatigue   . Chronic headaches   . Chronic pain   . Depression   . Endometriosis   . Family history of colon cancer    mother,uncles,aunts  . Fibromyalgia   . Gallstones   . GERD (gastroesophageal reflux disease)   . Hypothyroidism   . IBS (irritable  bowel syndrome)   . Insomnia   . Iron deficiency   . Laryngopharyngeal reflux (LPR)   . Obesity   . Personal history of colonic polyps 04/2006   polypoid  . PONV (postoperative nausea and vomiting)   . RLS (restless legs syndrome)     Family History  Problem Relation Age of Onset  . Colon cancer Mother        uncles,aunts  . Diabetes Father        paternal grandmother,sister  . Leukemia Father   . Lymphoma Father   . Skin cancer Father   . Heart disease Father   . Diabetes Sister   . Stomach cancer Neg Hx   . Esophageal cancer Neg Hx   . Rectal cancer Neg Hx     Social History   Socioeconomic History  . Marital status: Single    Spouse name: Not on file  . Number of children: 0  . Years of education: Not on file  . Highest education level: Not on file  Occupational History  . Occupation: RN/disabled  Tobacco Use  . Smoking status: Never Smoker  . Smokeless tobacco: Never Used  Substance and Sexual Activity  . Alcohol use: Not Currently    Alcohol/week: 0.0 standard drinks    Comment: rarely  . Drug use: No  . Sexual activity: Not on file  Other Topics Concern  . Not on file  Social History Narrative   Patient is left-handed. She lives alone in  a one level home. She has 3 cats. She is unable to exercise. Caffeine one soda / day. On disability now. 2 bachelor degrees   Social Determinants of Health   Financial Resource Strain:   . Difficulty of Paying Living Expenses: Not on file  Food Insecurity:   . Worried About Charity fundraiser in the Last Year: Not on file  . Ran Out of Food in the Last Year: Not on file  Transportation Needs:   . Lack of Transportation (Medical): Not on file  . Lack of Transportation (Non-Medical): Not on file  Physical Activity:   . Days of Exercise per Week: Not on file  . Minutes of Exercise per Session: Not on file  Stress:   . Feeling of Stress : Not on file  Social Connections:   . Frequency of Communication with Friends and Family: Not on file  . Frequency of Social Gatherings with Friends and Family: Not on file  . Attends Religious Services: Not on file  . Active Member of Clubs or Organizations: Not on file  . Attends Archivist Meetings: Not on file  . Marital Status: Not on file  Intimate Partner Violence:   . Fear of Current or Ex-Partner: Not on file  . Emotionally Abused: Not on file  . Physically Abused: Not on file  . Sexually Abused: Not on file    Past Medical History, Surgical history, Social history, and Family history were reviewed and updated as appropriate.   Please see review of systems for further details on the patient's review from today.   Objective:   Physical Exam:  There were no vitals taken for this visit.  Physical Exam Constitutional:      General: She is not in acute distress.    Appearance: She is well-developed.  Musculoskeletal:        General: No deformity.  Neurological:     Mental Status: She is alert and oriented to person, place, and time.  Coordination: Coordination normal.  Psychiatric:        Attention and Perception: Attention and perception normal. She does not perceive auditory or visual hallucinations.        Mood and  Affect: Mood normal. Mood is not anxious or depressed. Affect is not labile, blunt, angry or inappropriate.        Speech: Speech normal.        Behavior: Behavior normal.        Thought Content: Thought content normal. Thought content is not paranoid or delusional. Thought content does not include homicidal or suicidal ideation. Thought content does not include homicidal or suicidal plan.        Cognition and Memory: Cognition and memory normal.        Judgment: Judgment normal.     Comments: Insight intact     Lab Review:     Component Value Date/Time   NA 140 05/26/2019 1046   K 3.4 (L) 05/26/2019 1046   CL 95 (L) 05/26/2019 1046   CO2 37 (H) 05/26/2019 1046   GLUCOSE 97 05/26/2019 1046   BUN 17 05/26/2019 1046   CREATININE 1.09 05/26/2019 1046   CALCIUM 9.3 05/26/2019 1046   PROT 6.9 05/26/2019 1046   ALBUMIN 4.2 05/26/2019 1046   AST 36 05/26/2019 1046   ALT 30 05/26/2019 1046   ALKPHOS 98 05/26/2019 1046   BILITOT 0.4 05/26/2019 1046       Component Value Date/Time   WBC 6.9 05/26/2019 1046   RBC 4.42 05/26/2019 1046   HGB 12.6 05/26/2019 1046   HCT 38.7 05/26/2019 1046   PLT 209.0 05/26/2019 1046   MCV 87.6 05/26/2019 1046   MCHC 32.5 05/26/2019 1046   RDW 14.6 05/26/2019 1046   LYMPHSABS 2.0 05/26/2019 1046   MONOABS 0.6 05/26/2019 1046   EOSABS 0.1 05/26/2019 1046   BASOSABS 0.0 05/26/2019 1046    No results found for: POCLITH, LITHIUM   No results found for: PHENYTOIN, PHENOBARB, VALPROATE, CBMZ   .res Assessment: Plan:   Plan:  1. Wellbutrin SR 140m in the am  2. Cymbalta 666mBID 3. Trazadone 10081m 2 at hs  4. Vraylar 1.5mg2mily - samples  Continue therapy with Bambi Cottle.   RTC 4 weeks  Patient advised to contact office with any questions, adverse effects, or acute worsening in signs and symptoms.  Discussed potential metabolic side effects associated with atypical antipsychotics, as well as potential risk for movement side effects.  Advised pt to contact office if movement side effects occur.    Diagnoses and all orders for this visit:  Generalized anxiety disorder  PTSD (post-traumatic stress disorder)  Insomnia, unspecified type  Major depressive disorder, recurrent episode, moderate (HCC)  Episodic mood disorder (HCC)Chloride  Please see After Visit Summary for patient specific instructions.  Future Appointments  Date Time Provider [place]17/2021  3:00 PM Cottle, BambLucious GrovesSW [facility] None  01/26/2020  2:00 PM Cottle, Bambi G, LCSW [facility] None  02/02/2020  3:00 PM Cottle, Bambi G, LCSW [facility] None  02/09/2020  2:00 PM Cottle, Bambi G, LCSW [facility] None  02/16/2020  3:00 PM Cottle, Bambi G, LCSW [facility] None  02/23/2020  2:00 PM Cottle, Bambi G, LCSW [facility] None  03/01/2020  3:00 PM Cottle, Bambi G, LCSW [facility] None  03/15/2020  3:00 PM Cottle, Bambi G, LCSW [facility] None    No orders of the defined types were placed in this encounter.   -------------------------------

## 2020-01-19 ENCOUNTER — Ambulatory Visit (INDEPENDENT_AMBULATORY_CARE_PROVIDER_SITE_OTHER): Payer: PPO | Admitting: Psychology

## 2020-01-19 DIAGNOSIS — G4733 Obstructive sleep apnea (adult) (pediatric): Secondary | ICD-10-CM | POA: Diagnosis not present

## 2020-01-19 DIAGNOSIS — F4310 Post-traumatic stress disorder, unspecified: Secondary | ICD-10-CM

## 2020-01-19 DIAGNOSIS — G43009 Migraine without aura, not intractable, without status migrainosus: Secondary | ICD-10-CM | POA: Diagnosis not present

## 2020-01-21 DIAGNOSIS — L820 Inflamed seborrheic keratosis: Secondary | ICD-10-CM | POA: Diagnosis not present

## 2020-01-21 DIAGNOSIS — L821 Other seborrheic keratosis: Secondary | ICD-10-CM | POA: Diagnosis not present

## 2020-01-21 DIAGNOSIS — Z85828 Personal history of other malignant neoplasm of skin: Secondary | ICD-10-CM | POA: Diagnosis not present

## 2020-01-25 DIAGNOSIS — G894 Chronic pain syndrome: Secondary | ICD-10-CM | POA: Diagnosis not present

## 2020-01-25 DIAGNOSIS — F330 Major depressive disorder, recurrent, mild: Secondary | ICD-10-CM | POA: Diagnosis not present

## 2020-01-25 DIAGNOSIS — F4310 Post-traumatic stress disorder, unspecified: Secondary | ICD-10-CM | POA: Diagnosis not present

## 2020-01-26 ENCOUNTER — Ambulatory Visit (INDEPENDENT_AMBULATORY_CARE_PROVIDER_SITE_OTHER): Payer: PPO | Admitting: Psychology

## 2020-01-26 DIAGNOSIS — F4310 Post-traumatic stress disorder, unspecified: Secondary | ICD-10-CM | POA: Diagnosis not present

## 2020-01-31 DIAGNOSIS — M654 Radial styloid tenosynovitis [de Quervain]: Secondary | ICD-10-CM | POA: Diagnosis not present

## 2020-01-31 DIAGNOSIS — Z4789 Encounter for other orthopedic aftercare: Secondary | ICD-10-CM | POA: Diagnosis not present

## 2020-02-02 ENCOUNTER — Ambulatory Visit: Payer: PPO | Admitting: Psychology

## 2020-02-09 ENCOUNTER — Ambulatory Visit (INDEPENDENT_AMBULATORY_CARE_PROVIDER_SITE_OTHER): Payer: PPO | Admitting: Psychology

## 2020-02-09 DIAGNOSIS — M654 Radial styloid tenosynovitis [de Quervain]: Secondary | ICD-10-CM | POA: Diagnosis not present

## 2020-02-09 DIAGNOSIS — F4310 Post-traumatic stress disorder, unspecified: Secondary | ICD-10-CM

## 2020-02-09 DIAGNOSIS — Z4789 Encounter for other orthopedic aftercare: Secondary | ICD-10-CM | POA: Diagnosis not present

## 2020-02-09 DIAGNOSIS — M79642 Pain in left hand: Secondary | ICD-10-CM | POA: Diagnosis not present

## 2020-02-15 ENCOUNTER — Encounter: Payer: Self-pay | Admitting: Adult Health

## 2020-02-15 ENCOUNTER — Ambulatory Visit (INDEPENDENT_AMBULATORY_CARE_PROVIDER_SITE_OTHER): Payer: PPO | Admitting: Adult Health

## 2020-02-15 ENCOUNTER — Other Ambulatory Visit: Payer: Self-pay

## 2020-02-15 VITALS — Ht 69.0 in | Wt 235.8 lb

## 2020-02-15 DIAGNOSIS — F411 Generalized anxiety disorder: Secondary | ICD-10-CM | POA: Diagnosis not present

## 2020-02-15 DIAGNOSIS — F39 Unspecified mood [affective] disorder: Secondary | ICD-10-CM

## 2020-02-15 DIAGNOSIS — G4700 Insomnia, unspecified: Secondary | ICD-10-CM

## 2020-02-15 DIAGNOSIS — F331 Major depressive disorder, recurrent, moderate: Secondary | ICD-10-CM | POA: Diagnosis not present

## 2020-02-15 DIAGNOSIS — F4310 Post-traumatic stress disorder, unspecified: Secondary | ICD-10-CM

## 2020-02-15 NOTE — Progress Notes (Signed)
Paige [name] [id number] 1966/07/17 54 y.o.  Subjective:   Patient ID:  Paige [name] is a 54 y.o. (DOB 1966/09/21) female.  Chief Complaint: No chief complaint on file.   HPI Paige [name] presents to the office today for follow-up of GAD, MDD, PTSD, insomnia.  Describes mood today as "ok, not great bu good". Pleasant. Mood symptoms - reports decreased depression, anxiety, and irritability. Stating - "I feel kinda blah".  Feels like the weather changes are doing a "number on me". Spinal cord stimulator check up next week. Recent surgery on left wrist for tendonitis. Concerned about what gain - has been monitoring since last visit - averaging around 235 pounds. Stable interest and motivation. Continues to see therapist Chiropractor) every week for PTSD. Feels like it was "very"hepful. Now working on relationships. Taking medications as prescribed.  Energy levels stable. Active, walking daily. Averages 5 to 10,000 steps. Walking at the mall. Disabled since 2009. Enjoys some usual interests and activities. Single. Lives alone with 3 cats. Mother local and supportive. Appetite adequate - some days not eating and has days were she binges. Weight fluctuates around 235 pounds.  Sleeping better at night. Averages 6 hours. CPAP machine taken back for non-compliance.  Focus and concentration "a little better". Completing some household tasks. Manages some household tasks. Disabled. Denies SI or HI. Denies AH or VH.  Previous medication trials: Lexapro, Paxil, Trazadone, Wellbutrin SR, Geodon, Seroquel, Risperdal, Depakote, Topamax, Lithium, Provigil, Ambien, Cytomel, Clonazepam and other benzodixepaines, Elavil, Effexor, remeron, Prozac, Anafranil, Tofranil, Zoloft, Geodon, Buspar, Adderall, Ritalin, Abilify, Rexulti, Lamictal, Latuda   Mini-Mental     Office Visit from 04/07/2018 in [facility]  Total Score (max 30 points )  29    PHQ2-9     Patient Outreach from  05/22/2018 in [facility] from 05/13/2018 in [facility] Telephone from 04/30/2018 in [place]  PHQ-2 Total Score  5  5  2   PHQ-9 Total Score  20  18  13        Review of Systems:  Review of Systems  Musculoskeletal: Negative for gait problem.  Neurological: Negative for tremors.  Psychiatric/Behavioral:       Please refer to HPI    Medications: I have reviewed the patient's current medications.  Current Outpatient Medications  Medication Sig Dispense Refill  . buPROPion (WELLBUTRIN XL) 150 MG 24 hr tablet Take by mouth.    . cariprazine (VRAYLAR) capsule Take 1 capsule (1.5 mg total) by mouth daily. 28 capsule 0  . dicyclomine (BENTYL) 10 MG capsule Take 1 capsule (10 mg total) by mouth 3 (three) times daily as needed for spasms. 90 capsule 11  . DULoxetine (CYMBALTA) 60 MG capsule TAKE 1 CAPSULE BY MOUTH 2 TIMES DAILY 60 capsule 5  . Erenumab-aooe (AIMOVIG) 70 MG/ML SOAJ Inject 70 mg into the skin every 28 (twenty-eight) days. 1 pen 11  . Fe Fum-Fe Poly-Vit C-Lactobac (FUSION) 65-65-25-30 MG CAPS Take by mouth.    . Ferrous Sulfate Dried (SLOW RELEASE IRON) 45 MG TBCR Take 1 tablet by mouth daily.    Marland Kitchen ipratropium (ATROVENT) 0.03 % nasal spray Place 2 sprays into both nostrils as needed.  30 mL 12  . ipratropium (ATROVENT) 0.06 % nasal spray INSTILL 2 SPRAYS INTO EACH NOSTRIL AS NEEDED TWICE A DAY    . ketoconazole (NIZORAL) 2 % shampoo Apply topically.    . lamoTRIgine (LAMICTAL) 25 MG tablet Take by mouth.    Marland Kitchen  levothyroxine (SYNTHROID, LEVOTHROID) 88 MCG tablet Take 88 mcg by mouth daily before breakfast.    . methocarbamol (ROBAXIN) 500 MG tablet Take 500 mg by mouth 3 (three) times daily.    Marland Kitchen nystatin cream (MYCOSTATIN) Apply 1 application topically daily as needed for dry skin.    Marland Kitchen omeprazole (PRILOSEC) 20 MG capsule     . ondansetron (ZOFRAN-ODT) 4 MG disintegrating tablet Take 4 mg by mouth every 8  (eight) hours as needed for nausea or vomiting.    . Oxycodone HCl 10 MG TABS Take 10 mg by mouth every 6 (six) hours as needed.    Vladimir Faster Glycol-Propyl Glycol (SYSTANE) 0.4-0.3 % SOLN Apply 1 drop to eye as needed.    . polyethylene glycol (MIRALAX / GLYCOLAX) 17 g packet Take 17 g by mouth 2 (two) times daily.    . potassium chloride (K-DUR,KLOR-CON) 10 MEQ tablet Take 1 tablet by mouth daily.    . potassium chloride (KLOR-CON) 10 MEQ tablet Take by mouth.    . promethazine (PHENERGAN) 12.5 MG tablet Take 1 tablet (12.5 mg total) by mouth every 8 (eight) hours as needed for nausea or vomiting. 30 tablet 0  . rizatriptan (MAXALT) 10 MG tablet rizatriptan 10 mg tablet  TAKE 1 TABLET AT EARLIEST ONSET OF MIGRAINE. MAY REPEAT ONCE IN 2 HOURS IF NEEDED    . rOPINIRole (REQUIP) 2 MG tablet Take 2 mg by mouth 2 (two) times daily.    Marland Kitchen terconazole (TERAZOL 3) 0.8 % vaginal cream Place 1 applicator vaginally at bedtime.    Marland Kitchen tiZANidine (ZANAFLEX) 2 MG tablet Take 2 mg by mouth 3 (three) times daily.    Marland Kitchen torsemide (DEMADEX) 20 MG tablet Take 20 mg by mouth daily.    . traZODone (DESYREL) 100 MG tablet Take 2 tablets (200 mg total) by mouth at bedtime. 60 tablet 5  . Ubrogepant (UBRELVY) 100 MG TABS Take 1 tablet by mouth daily as needed (at onset of headaches may repeat dose after 2 hours as needed max 200 mg in 24 hours). 16 tablet 3   Current Facility-Administered Medications  Medication Dose Route Frequency Provider Last Rate Last Admin  . 0.9 %  sodium chloride infusion  500 mL Intravenous Once Ladene Artist, MD        Medication Side Effects: None  Allergies:  Allergies  Allergen Reactions  . Oxycodone-Acetaminophen Hives  . Topiramate Shortness Of Breath and Other (See Comments)    Really wierd feeling Off balance; cloudy thinking Really wierd feeling  . Cyclobenzaprine Other (See Comments)    Other reaction(s): Unknown Hallucinations Unknown   . Meloxicam Other (See  Comments)  . Morphine Itching    Other reaction(s): Unknown  ; has tolerated Diluadid per MAR  . Naltrexone Other (See Comments)    SEVERE PAIN ALL OVER  . Pork (Porcine) Protein Other (See Comments)    Other reaction(s): Other (See Comments) Migraines Other reaction(s): Other (See Comments) Migraines  . Sumatriptan Other (See Comments)    Difficulty breathing Other reaction(s): Other (see comments) Off balance   . Tape Dermatitis  . Flexeril [Cyclobenzaprine Hcl]   . Imitrex [Sumatriptan Base]   . Morphine And Related   . Oxycodone Hcl   . Percocet [Oxycodone-Acetaminophen]     Past Medical History:  Diagnosis Date  . Allergic rhinitis   . Anemia   . Anxiety disorder   . Chronic fatigue   . Chronic headaches   . Chronic pain   .  Depression   . Endometriosis   . Family history of colon cancer    mother,uncles,aunts  . Fibromyalgia   . Gallstones   . GERD (gastroesophageal reflux disease)   . Hypothyroidism   . IBS (irritable bowel syndrome)   . Insomnia   . Iron deficiency   . Laryngopharyngeal reflux (LPR)   . Obesity   . Personal history of colonic polyps 04/2006   polypoid  . PONV (postoperative nausea and vomiting)   . RLS (restless legs syndrome)     Family History  Problem Relation Age of Onset  . Colon cancer Mother        uncles,aunts  . Diabetes Father        paternal grandmother,sister  . Leukemia Father   . Lymphoma Father   . Skin cancer Father   . Heart disease Father   . Diabetes Sister   . Stomach cancer Neg Hx   . Esophageal cancer Neg Hx   . Rectal cancer Neg Hx     Social History   Socioeconomic History  . Marital status: Single    Spouse name: Not on file  . Number of children: 0  . Years of education: Not on file  . Highest education level: Not on file  Occupational History  . Occupation: RN/disabled  Tobacco Use  . Smoking status: Never Smoker  . Smokeless tobacco: Never Used  Substance and Sexual Activity  .  Alcohol use: Not Currently    Alcohol/week: 0.0 standard drinks    Comment: rarely  . Drug use: No  . Sexual activity: Not on file  Other Topics Concern  . Not on file  Social History Narrative   Patient is left-handed. She lives alone in a one level home. She has 3 cats. She is unable to exercise. Caffeine one soda / day. On disability now. 2 bachelor degrees   Social Determinants of Health   Financial Resource Strain:   . Difficulty of Paying Living Expenses:   Food Insecurity:   . Worried About Charity fundraiser in the Last Year:   . Arboriculturist in the Last Year:   Transportation Needs:   . Film/video editor (Medical):   Marland Kitchen Lack of Transportation (Non-Medical):   Physical Activity:   . Days of Exercise per Week:   . Minutes of Exercise per Session:   Stress:   . Feeling of Stress :   Social Connections:   . Frequency of Communication with Friends and Family:   . Frequency of Social Gatherings with Friends and Family:   . Attends Religious Services:   . Active Member of Clubs or Organizations:   . Attends Archivist Meetings:   Marland Kitchen Marital Status:   Intimate Partner Violence:   . Fear of Current or Ex-Partner:   . Emotionally Abused:   Marland Kitchen Physically Abused:   . Sexually Abused:     Past Medical History, Surgical history, Social history, and Family history were reviewed and updated as appropriate.   Please see review of systems for further details on the patient's review from today.   Objective:   Physical Exam:  Ht 5\' 9"  (1.753 m)   Wt 235 lb 12.8 oz (107 kg)   BMI 34.82 kg/m   Physical Exam Constitutional:      General: She is not in acute distress. Musculoskeletal:        General: No deformity.  Neurological:     Mental Status: She is alert and  oriented to person, place, and time.     Coordination: Coordination normal.  Psychiatric:        Attention and Perception: Attention and perception normal. She does not perceive auditory or  visual hallucinations.        Mood and Affect: Mood normal. Mood is not anxious or depressed. Affect is not labile, blunt, angry or inappropriate.        Speech: Speech normal.        Behavior: Behavior normal.        Thought Content: Thought content normal. Thought content is not paranoid or delusional. Thought content does not include homicidal or suicidal ideation. Thought content does not include homicidal or suicidal plan.        Cognition and Memory: Cognition and memory normal.        Judgment: Judgment normal.     Comments: Insight intact     Lab Review:     Component Value Date/Time   NA 140 05/26/2019 1046   K 3.4 (L) 05/26/2019 1046   CL 95 (L) 05/26/2019 1046   CO2 37 (H) 05/26/2019 1046   GLUCOSE 97 05/26/2019 1046   BUN 17 05/26/2019 1046   CREATININE 1.09 05/26/2019 1046   CALCIUM 9.3 05/26/2019 1046   PROT 6.9 05/26/2019 1046   ALBUMIN 4.2 05/26/2019 1046   AST 36 05/26/2019 1046   ALT 30 05/26/2019 1046   ALKPHOS 98 05/26/2019 1046   BILITOT 0.4 05/26/2019 1046       Component Value Date/Time   WBC 6.9 05/26/2019 1046   RBC 4.42 05/26/2019 1046   HGB 12.6 05/26/2019 1046   HCT 38.7 05/26/2019 1046   PLT 209.0 05/26/2019 1046   MCV 87.6 05/26/2019 1046   MCHC 32.5 05/26/2019 1046   RDW 14.6 05/26/2019 1046   LYMPHSABS 2.0 05/26/2019 1046   MONOABS 0.6 05/26/2019 1046   EOSABS 0.1 05/26/2019 1046   BASOSABS 0.0 05/26/2019 1046    No results found for: POCLITH, LITHIUM   No results found for: PHENYTOIN, PHENOBARB, VALPROATE, CBMZ   .res Assessment: Plan:    Plan:  1. Wellbutrin SR 150mg  in the am  2. Cymbalta 60mg  BID 3. Trazadone 100mg  - 2 at hs  4. Vraylar 1.5mg  daily - samples given x 2 months  Continue therapy with Bambi Cottle.   RTC 2 months  Patient advised to contact office with any questions, adverse effects, or acute worsening in signs and symptoms.  Discussed potential metabolic side effects associated with atypical  antipsychotics, as well as potential risk for movement side effects. Advised pt to contact office if movement side effects occur.   Diagnoses and all orders for this visit:  Generalized anxiety disorder  PTSD (post-traumatic stress disorder)  Insomnia, unspecified type  Major depressive disorder, recurrent episode, moderate (HCC)  Episodic mood disorder ([place])     Please see After Visit Summary for patient specific instructions.  Future Appointments  Date Time Provider [place]  02/16/2020  3:00 PM Cottle, Lucious [place], LCSW [facility] None  02/23/2020  2:00 PM Cottle, Bambi G, LCSW [facility] None  03/01/2020  3:00 PM Cottle, Bambi G, LCSW [facility] None  03/08/2020  2:00 PM Cottle, Bambi G, LCSW [facility] None  03/15/2020  3:00 PM Cottle, Bambi G, LCSW [facility] None    No orders of the defined types were placed in this encounter.   -------------------------------

## 2020-02-16 ENCOUNTER — Ambulatory Visit (INDEPENDENT_AMBULATORY_CARE_PROVIDER_SITE_OTHER): Payer: PPO | Admitting: Psychology

## 2020-02-16 DIAGNOSIS — G4733 Obstructive sleep apnea (adult) (pediatric): Secondary | ICD-10-CM | POA: Diagnosis not present

## 2020-02-16 DIAGNOSIS — G43009 Migraine without aura, not intractable, without status migrainosus: Secondary | ICD-10-CM | POA: Diagnosis not present

## 2020-02-16 DIAGNOSIS — F4310 Post-traumatic stress disorder, unspecified: Secondary | ICD-10-CM | POA: Diagnosis not present

## 2020-02-16 DIAGNOSIS — M79642 Pain in left hand: Secondary | ICD-10-CM | POA: Diagnosis not present

## 2020-02-17 DIAGNOSIS — F3340 Major depressive disorder, recurrent, in remission, unspecified: Secondary | ICD-10-CM | POA: Diagnosis not present

## 2020-02-17 DIAGNOSIS — F4310 Post-traumatic stress disorder, unspecified: Secondary | ICD-10-CM | POA: Diagnosis not present

## 2020-02-17 DIAGNOSIS — G894 Chronic pain syndrome: Secondary | ICD-10-CM | POA: Diagnosis not present

## 2020-02-23 ENCOUNTER — Ambulatory Visit: Payer: PPO | Admitting: Psychology

## 2020-02-23 DIAGNOSIS — H1045 Other chronic allergic conjunctivitis: Secondary | ICD-10-CM | POA: Diagnosis not present

## 2020-02-23 DIAGNOSIS — M545 Low back pain: Secondary | ICD-10-CM | POA: Diagnosis not present

## 2020-02-23 DIAGNOSIS — H04123 Dry eye syndrome of bilateral lacrimal glands: Secondary | ICD-10-CM | POA: Diagnosis not present

## 2020-02-23 DIAGNOSIS — H524 Presbyopia: Secondary | ICD-10-CM | POA: Diagnosis not present

## 2020-02-23 DIAGNOSIS — H2513 Age-related nuclear cataract, bilateral: Secondary | ICD-10-CM | POA: Diagnosis not present

## 2020-02-23 DIAGNOSIS — H25013 Cortical age-related cataract, bilateral: Secondary | ICD-10-CM | POA: Diagnosis not present

## 2020-02-25 DIAGNOSIS — M79642 Pain in left hand: Secondary | ICD-10-CM | POA: Diagnosis not present

## 2020-02-28 DIAGNOSIS — M65331 Trigger finger, right middle finger: Secondary | ICD-10-CM | POA: Diagnosis not present

## 2020-02-28 DIAGNOSIS — Z4789 Encounter for other orthopedic aftercare: Secondary | ICD-10-CM | POA: Diagnosis not present

## 2020-02-28 DIAGNOSIS — M545 Low back pain: Secondary | ICD-10-CM | POA: Diagnosis not present

## 2020-02-28 DIAGNOSIS — M654 Radial styloid tenosynovitis [de Quervain]: Secondary | ICD-10-CM | POA: Diagnosis not present

## 2020-02-28 DIAGNOSIS — G5601 Carpal tunnel syndrome, right upper limb: Secondary | ICD-10-CM | POA: Diagnosis not present

## 2020-02-28 DIAGNOSIS — M4716 Other spondylosis with myelopathy, lumbar region: Secondary | ICD-10-CM | POA: Diagnosis not present

## 2020-03-01 ENCOUNTER — Ambulatory Visit (INDEPENDENT_AMBULATORY_CARE_PROVIDER_SITE_OTHER): Payer: PPO | Admitting: Psychology

## 2020-03-01 DIAGNOSIS — F4310 Post-traumatic stress disorder, unspecified: Secondary | ICD-10-CM | POA: Diagnosis not present

## 2020-03-02 DIAGNOSIS — Z9884 Bariatric surgery status: Secondary | ICD-10-CM | POA: Diagnosis not present

## 2020-03-02 DIAGNOSIS — M79642 Pain in left hand: Secondary | ICD-10-CM | POA: Diagnosis not present

## 2020-03-02 DIAGNOSIS — D509 Iron deficiency anemia, unspecified: Secondary | ICD-10-CM | POA: Diagnosis not present

## 2020-03-06 DIAGNOSIS — M545 Low back pain: Secondary | ICD-10-CM | POA: Diagnosis not present

## 2020-03-06 DIAGNOSIS — M4716 Other spondylosis with myelopathy, lumbar region: Secondary | ICD-10-CM | POA: Diagnosis not present

## 2020-03-08 ENCOUNTER — Ambulatory Visit (INDEPENDENT_AMBULATORY_CARE_PROVIDER_SITE_OTHER): Payer: PPO | Admitting: Psychology

## 2020-03-08 DIAGNOSIS — F4310 Post-traumatic stress disorder, unspecified: Secondary | ICD-10-CM

## 2020-03-09 DIAGNOSIS — M545 Low back pain: Secondary | ICD-10-CM | POA: Diagnosis not present

## 2020-03-09 DIAGNOSIS — M4716 Other spondylosis with myelopathy, lumbar region: Secondary | ICD-10-CM | POA: Diagnosis not present

## 2020-03-13 DIAGNOSIS — M4716 Other spondylosis with myelopathy, lumbar region: Secondary | ICD-10-CM | POA: Diagnosis not present

## 2020-03-13 DIAGNOSIS — M545 Low back pain: Secondary | ICD-10-CM | POA: Diagnosis not present

## 2020-03-15 ENCOUNTER — Ambulatory Visit: Payer: PPO | Admitting: Psychology

## 2020-03-16 DIAGNOSIS — M4716 Other spondylosis with myelopathy, lumbar region: Secondary | ICD-10-CM | POA: Diagnosis not present

## 2020-03-16 DIAGNOSIS — M545 Low back pain: Secondary | ICD-10-CM | POA: Diagnosis not present

## 2020-03-18 DIAGNOSIS — G4733 Obstructive sleep apnea (adult) (pediatric): Secondary | ICD-10-CM | POA: Diagnosis not present

## 2020-03-18 DIAGNOSIS — G43009 Migraine without aura, not intractable, without status migrainosus: Secondary | ICD-10-CM | POA: Diagnosis not present

## 2020-03-20 DIAGNOSIS — M545 Low back pain: Secondary | ICD-10-CM | POA: Diagnosis not present

## 2020-03-20 DIAGNOSIS — M4716 Other spondylosis with myelopathy, lumbar region: Secondary | ICD-10-CM | POA: Diagnosis not present

## 2020-03-22 ENCOUNTER — Ambulatory Visit (INDEPENDENT_AMBULATORY_CARE_PROVIDER_SITE_OTHER): Payer: PPO | Admitting: Psychology

## 2020-03-22 DIAGNOSIS — F4310 Post-traumatic stress disorder, unspecified: Secondary | ICD-10-CM | POA: Diagnosis not present

## 2020-03-23 DIAGNOSIS — M4716 Other spondylosis with myelopathy, lumbar region: Secondary | ICD-10-CM | POA: Diagnosis not present

## 2020-03-23 DIAGNOSIS — M545 Low back pain: Secondary | ICD-10-CM | POA: Diagnosis not present

## 2020-03-24 DIAGNOSIS — M5030 Other cervical disc degeneration, unspecified cervical region: Secondary | ICD-10-CM | POA: Diagnosis not present

## 2020-03-24 DIAGNOSIS — M549 Dorsalgia, unspecified: Secondary | ICD-10-CM | POA: Diagnosis not present

## 2020-03-24 DIAGNOSIS — G894 Chronic pain syndrome: Secondary | ICD-10-CM | POA: Diagnosis not present

## 2020-03-24 DIAGNOSIS — Z79899 Other long term (current) drug therapy: Secondary | ICD-10-CM | POA: Diagnosis not present

## 2020-03-27 DIAGNOSIS — M4716 Other spondylosis with myelopathy, lumbar region: Secondary | ICD-10-CM | POA: Diagnosis not present

## 2020-03-27 DIAGNOSIS — M545 Low back pain: Secondary | ICD-10-CM | POA: Diagnosis not present

## 2020-03-29 ENCOUNTER — Ambulatory Visit (INDEPENDENT_AMBULATORY_CARE_PROVIDER_SITE_OTHER): Payer: PPO | Admitting: Psychology

## 2020-03-29 DIAGNOSIS — F4310 Post-traumatic stress disorder, unspecified: Secondary | ICD-10-CM | POA: Diagnosis not present

## 2020-03-30 DIAGNOSIS — Z7189 Other specified counseling: Secondary | ICD-10-CM | POA: Diagnosis not present

## 2020-03-30 DIAGNOSIS — Z6835 Body mass index (BMI) 35.0-35.9, adult: Secondary | ICD-10-CM | POA: Diagnosis not present

## 2020-03-30 DIAGNOSIS — M533 Sacrococcygeal disorders, not elsewhere classified: Secondary | ICD-10-CM | POA: Diagnosis not present

## 2020-03-30 DIAGNOSIS — Z1331 Encounter for screening for depression: Secondary | ICD-10-CM | POA: Diagnosis not present

## 2020-03-30 DIAGNOSIS — M5030 Other cervical disc degeneration, unspecified cervical region: Secondary | ICD-10-CM | POA: Diagnosis not present

## 2020-03-31 DIAGNOSIS — M533 Sacrococcygeal disorders, not elsewhere classified: Secondary | ICD-10-CM | POA: Diagnosis not present

## 2020-03-31 DIAGNOSIS — M545 Low back pain: Secondary | ICD-10-CM | POA: Diagnosis not present

## 2020-04-04 DIAGNOSIS — M65331 Trigger finger, right middle finger: Secondary | ICD-10-CM | POA: Diagnosis not present

## 2020-04-04 DIAGNOSIS — G5601 Carpal tunnel syndrome, right upper limb: Secondary | ICD-10-CM | POA: Diagnosis not present

## 2020-04-04 DIAGNOSIS — M654 Radial styloid tenosynovitis [de Quervain]: Secondary | ICD-10-CM | POA: Diagnosis not present

## 2020-04-04 DIAGNOSIS — M65831 Other synovitis and tenosynovitis, right forearm: Secondary | ICD-10-CM | POA: Diagnosis not present

## 2020-04-05 ENCOUNTER — Ambulatory Visit (INDEPENDENT_AMBULATORY_CARE_PROVIDER_SITE_OTHER): Payer: PPO | Admitting: Psychology

## 2020-04-05 DIAGNOSIS — F4310 Post-traumatic stress disorder, unspecified: Secondary | ICD-10-CM

## 2020-04-12 ENCOUNTER — Ambulatory Visit (INDEPENDENT_AMBULATORY_CARE_PROVIDER_SITE_OTHER): Payer: PPO | Admitting: Psychology

## 2020-04-12 DIAGNOSIS — F4310 Post-traumatic stress disorder, unspecified: Secondary | ICD-10-CM | POA: Diagnosis not present

## 2020-04-14 DIAGNOSIS — G894 Chronic pain syndrome: Secondary | ICD-10-CM | POA: Diagnosis not present

## 2020-04-14 DIAGNOSIS — M533 Sacrococcygeal disorders, not elsewhere classified: Secondary | ICD-10-CM | POA: Diagnosis not present

## 2020-04-17 DIAGNOSIS — G43009 Migraine without aura, not intractable, without status migrainosus: Secondary | ICD-10-CM | POA: Diagnosis not present

## 2020-04-17 DIAGNOSIS — G4733 Obstructive sleep apnea (adult) (pediatric): Secondary | ICD-10-CM | POA: Diagnosis not present

## 2020-04-19 ENCOUNTER — Ambulatory Visit (INDEPENDENT_AMBULATORY_CARE_PROVIDER_SITE_OTHER): Payer: PPO | Admitting: Psychology

## 2020-04-19 DIAGNOSIS — F4310 Post-traumatic stress disorder, unspecified: Secondary | ICD-10-CM | POA: Diagnosis not present

## 2020-04-25 DIAGNOSIS — M79641 Pain in right hand: Secondary | ICD-10-CM | POA: Diagnosis not present

## 2020-04-26 ENCOUNTER — Ambulatory Visit (INDEPENDENT_AMBULATORY_CARE_PROVIDER_SITE_OTHER): Payer: PPO | Admitting: Psychology

## 2020-04-26 DIAGNOSIS — F4310 Post-traumatic stress disorder, unspecified: Secondary | ICD-10-CM | POA: Diagnosis not present

## 2020-05-03 ENCOUNTER — Ambulatory Visit (INDEPENDENT_AMBULATORY_CARE_PROVIDER_SITE_OTHER): Payer: PPO | Admitting: Psychology

## 2020-05-03 DIAGNOSIS — F4310 Post-traumatic stress disorder, unspecified: Secondary | ICD-10-CM | POA: Diagnosis not present

## 2020-05-04 ENCOUNTER — Encounter: Payer: Self-pay | Admitting: Gastroenterology

## 2020-05-04 DIAGNOSIS — M79641 Pain in right hand: Secondary | ICD-10-CM | POA: Diagnosis not present

## 2020-05-10 ENCOUNTER — Ambulatory Visit: Payer: PPO | Admitting: Psychology

## 2020-05-17 ENCOUNTER — Ambulatory Visit (INDEPENDENT_AMBULATORY_CARE_PROVIDER_SITE_OTHER): Payer: PPO | Admitting: Psychology

## 2020-05-17 ENCOUNTER — Encounter: Payer: Self-pay | Admitting: Adult Health

## 2020-05-17 ENCOUNTER — Ambulatory Visit (INDEPENDENT_AMBULATORY_CARE_PROVIDER_SITE_OTHER): Payer: PPO | Admitting: Adult Health

## 2020-05-17 ENCOUNTER — Other Ambulatory Visit: Payer: Self-pay

## 2020-05-17 DIAGNOSIS — F39 Unspecified mood [affective] disorder: Secondary | ICD-10-CM | POA: Diagnosis not present

## 2020-05-17 DIAGNOSIS — G4700 Insomnia, unspecified: Secondary | ICD-10-CM | POA: Diagnosis not present

## 2020-05-17 DIAGNOSIS — F331 Major depressive disorder, recurrent, moderate: Secondary | ICD-10-CM | POA: Diagnosis not present

## 2020-05-17 DIAGNOSIS — F4310 Post-traumatic stress disorder, unspecified: Secondary | ICD-10-CM

## 2020-05-17 DIAGNOSIS — F411 Generalized anxiety disorder: Secondary | ICD-10-CM

## 2020-05-17 NOTE — Progress Notes (Signed)
Paige [name] [id number] 01-Nov-1966 54 y.o.  Subjective:   Patient ID:  Paige [name] is a 54 y.o. (DOB [birth year]) female.  Chief Complaint: No chief complaint on file.   HPI Paige [name] presents to the office today for follow-up of GAD, MDD, PTSD, insomnia.  Describes mood today as "ok". Pleasant. Mood symptoms - reports decreased depression, anxiety, and irritability. Stating - "I'm doing alright". Spinal cord stimulator helpful, but not enough. Still needing to take pain medication. Recovering from wrist surgery - "tendonitis". Getting outside. Planted a garden - "tomatoes and cucumbers". Stable interest and motivation. Continues to see therapist Chiropractor) every week for PTSD. Feels Taking medications as prescribed.  Energy levels "fair". Active, walking daily. Working 8 to 10 hours a week in respite. Disabled since 2009. Enjoys some usual interests and activities. Single. Lives alone with 3 cats. Has started dating - met online - talking for past 6 weeks. Mother local and supportive. Appetite adequate. Weight loss - 6 pounds - 229 pounds.  Sleeps well most nights. Averages 7 to 8 hours.  Focus and concentration difficulties - "had been better". Completing some household tasks. Manages some household tasks. Disabled. Denies SI or HI. Denies AH or VH.  Previous medication trials: Lexapro, Paxil, Trazadone, Wellbutrin SR, Geodon, Seroquel, Risperdal, Depakote, Topamax, Lithium, Provigil, Ambien, Cytomel, Clonazepam and other benzodixepaines, Elavil, Effexor, remeron, Prozac, Anafranil, Tofranil, Zoloft, Geodon, Buspar, Adderall, Ritalin, Abilify, Rexulti, Lamictal, Latuda    Mini-Mental     Office Visit from 04/07/2018 in [facility]  Total Score (max 30 points ) 29    PHQ2-9     Patient Outreach from 05/22/2018 in [facility] from 05/13/2018 in [facility] Telephone from 04/30/2018 in  [place]  PHQ-2 Total Score '5 5 2  ' PHQ-9 Total Score '20 18 13       ' Review of Systems:  Review of Systems  Musculoskeletal: Negative for gait problem.  Neurological: Negative for tremors.  Psychiatric/Behavioral:       Please refer to HPI    Medications: I have reviewed the patient's current medications.  Current Outpatient Medications  Medication Sig Dispense Refill  . buPROPion (WELLBUTRIN XL) 150 MG 24 hr tablet Take by mouth.    . cariprazine (VRAYLAR) capsule Take 1 capsule (1.5 mg total) by mouth daily. 28 capsule 0  . dicyclomine (BENTYL) 10 MG capsule Take 1 capsule (10 mg total) by mouth 3 (three) times daily as needed for spasms. 90 capsule 11  . DULoxetine (CYMBALTA) 60 MG capsule TAKE 1 CAPSULE BY MOUTH 2 TIMES DAILY 60 capsule 5  . Erenumab-aooe (AIMOVIG) 70 MG/ML SOAJ Inject 70 mg into the skin every 28 (twenty-eight) days. 1 pen 11  . Fe Fum-Fe Poly-Vit C-Lactobac (FUSION) 65-65-25-30 MG CAPS Take by mouth.    . Ferrous Sulfate Dried (SLOW RELEASE IRON) 45 MG TBCR Take 1 tablet by mouth daily.    Marland Kitchen ipratropium (ATROVENT) 0.03 % nasal spray Place 2 sprays into both nostrils as needed.  30 mL 12  . ipratropium (ATROVENT) 0.06 % nasal spray INSTILL 2 SPRAYS INTO EACH NOSTRIL AS NEEDED TWICE A DAY    . ketoconazole (NIZORAL) 2 % shampoo Apply topically.    . lamoTRIgine (LAMICTAL) 25 MG tablet Take by mouth.    . levothyroxine (SYNTHROID, LEVOTHROID) 88 MCG tablet Take 88 mcg by mouth daily before breakfast.    . methocarbamol (ROBAXIN) 500 MG tablet Take 500 mg by mouth  3 (three) times daily.    Marland Kitchen nystatin cream (MYCOSTATIN) Apply 1 application topically daily as needed for dry skin.    Marland Kitchen omeprazole (PRILOSEC) 20 MG capsule     . ondansetron (ZOFRAN-ODT) 4 MG disintegrating tablet Take 4 mg by mouth every 8 (eight) hours as needed for nausea or vomiting.    . Oxycodone HCl 10 MG TABS Take 10 mg by mouth every 6 (six) hours as needed.    Vladimir Faster  Glycol-Propyl Glycol (SYSTANE) 0.4-0.3 % SOLN Apply 1 drop to eye as needed.    . polyethylene glycol (MIRALAX / GLYCOLAX) 17 g packet Take 17 g by mouth 2 (two) times daily.    . potassium chloride (K-DUR,KLOR-CON) 10 MEQ tablet Take 1 tablet by mouth daily.    . potassium chloride (KLOR-CON) 10 MEQ tablet Take by mouth.    . promethazine (PHENERGAN) 12.5 MG tablet Take 1 tablet (12.5 mg total) by mouth every 8 (eight) hours as needed for nausea or vomiting. 30 tablet 0  . rizatriptan (MAXALT) 10 MG tablet rizatriptan 10 mg tablet  TAKE 1 TABLET AT EARLIEST ONSET OF MIGRAINE. MAY REPEAT ONCE IN 2 HOURS IF NEEDED    . rOPINIRole (REQUIP) 2 MG tablet Take 2 mg by mouth 2 (two) times daily.    Marland Kitchen terconazole (TERAZOL 3) 0.8 % vaginal cream Place 1 applicator vaginally at bedtime.    Marland Kitchen tiZANidine (ZANAFLEX) 2 MG tablet Take 2 mg by mouth 3 (three) times daily.    Marland Kitchen torsemide (DEMADEX) 20 MG tablet Take 20 mg by mouth daily.    . traZODone (DESYREL) 100 MG tablet Take 2 tablets (200 mg total) by mouth at bedtime. 60 tablet 5  . Ubrogepant (UBRELVY) 100 MG TABS Take 1 tablet by mouth daily as needed (at onset of headaches may repeat dose after 2 hours as needed max 200 mg in 24 hours). 16 tablet 3   Current Facility-Administered Medications  Medication Dose Route Frequency Provider Last Rate Last Admin  . 0.9 %  sodium chloride infusion  500 mL Intravenous Once Ladene Artist, MD        Medication Side Effects: None  Allergies:  Allergies  Allergen Reactions  . Oxycodone-Acetaminophen Hives  . Topiramate Shortness Of Breath and Other (See Comments)    Really wierd feeling Off balance; cloudy thinking Really wierd feeling  . Cyclobenzaprine Other (See Comments)    Other reaction(s): Unknown Hallucinations Unknown   . Meloxicam Other (See Comments)  . Morphine Itching    Other reaction(s): Unknown  ; has tolerated Diluadid per MAR  . Naltrexone Other (See Comments)    SEVERE PAIN  ALL OVER  . Pork (Porcine) Protein Other (See Comments)    Other reaction(s): Other (See Comments) Migraines Other reaction(s): Other (See Comments) Migraines  . Sumatriptan Other (See Comments)    Difficulty breathing Other reaction(s): Other (see comments) Off balance   . Tape Dermatitis  . Flexeril [Cyclobenzaprine Hcl]   . Imitrex [Sumatriptan Base]   . Morphine And Related   . Oxycodone Hcl   . Percocet [Oxycodone-Acetaminophen]     Past Medical History:  Diagnosis Date  . Allergic rhinitis   . Anemia   . Anxiety disorder   . Chronic fatigue   . Chronic headaches   . Chronic pain   . Depression   . Endometriosis   . Family history of colon cancer    mother,uncles,aunts  . Fibromyalgia   . Gallstones   . GERD (  gastroesophageal reflux disease)   . Hypothyroidism   . IBS (irritable bowel syndrome)   . Insomnia   . Iron deficiency   . Laryngopharyngeal reflux (LPR)   . Obesity   . Personal history of colonic polyps 04/2006   polypoid  . PONV (postoperative nausea and vomiting)   . RLS (restless legs syndrome)     Family History  Problem Relation Age of Onset  . Colon cancer Mother        uncles,aunts  . Diabetes Father        paternal grandmother,sister  . Leukemia Father   . Lymphoma Father   . Skin cancer Father   . Heart disease Father   . Diabetes Sister   . Stomach cancer Neg Hx   . Esophageal cancer Neg Hx   . Rectal cancer Neg Hx     Social History   Socioeconomic History  . Marital status: Single    Spouse name: Not on file  . Number of children: 0  . Years of education: Not on file  . Highest education level: Not on file  Occupational History  . Occupation: RN/disabled  Tobacco Use  . Smoking status: Never Smoker  . Smokeless tobacco: Never Used  Vaping Use  . Vaping Use: Never used  Substance and Sexual Activity  . Alcohol use: Not Currently    Alcohol/week: 0.0 standard drinks    Comment: rarely  . Drug use: No  . Sexual  activity: Not on file  Other Topics Concern  . Not on file  Social History Narrative   Patient is left-handed. She lives alone in a one level home. She has 3 cats. She is unable to exercise. Caffeine one soda / day. On disability now. 2 bachelor degrees   Social Determinants of Health   Financial Resource Strain:   . Difficulty of Paying Living Expenses:   Food Insecurity:   . Worried About Charity fundraiser in the Last Year:   . Arboriculturist in the Last Year:   Transportation Needs:   . Film/video editor (Medical):   Marland Kitchen Lack of Transportation (Non-Medical):   Physical Activity:   . Days of Exercise per Week:   . Minutes of Exercise per Session:   Stress:   . Feeling of Stress :   Social Connections:   . Frequency of Communication with Friends and Family:   . Frequency of Social Gatherings with Friends and Family:   . Attends Religious Services:   . Active Member of Clubs or Organizations:   . Attends Archivist Meetings:   Marland Kitchen Marital Status:   Intimate Partner Violence:   . Fear of Current or Ex-Partner:   . Emotionally Abused:   Marland Kitchen Physically Abused:   . Sexually Abused:     Past Medical History, Surgical history, Social history, and Family history were reviewed and updated as appropriate.   Please see review of systems for further details on the patient's review from today.   Objective:   Physical Exam:  There were no vitals taken for this visit.  Physical Exam Constitutional:      General: She is not in acute distress. Musculoskeletal:        General: No deformity.  Neurological:     Mental Status: She is alert and oriented to person, place, and time.     Coordination: Coordination normal.  Psychiatric:        Attention and Perception: Attention and perception normal. She does  not perceive auditory or visual hallucinations.        Mood and Affect: Mood normal. Mood is not anxious or depressed. Affect is not labile, blunt, angry or  inappropriate.        Speech: Speech normal.        Behavior: Behavior normal.        Thought Content: Thought content normal. Thought content is not paranoid or delusional. Thought content does not include homicidal or suicidal ideation. Thought content does not include homicidal or suicidal plan.        Cognition and Memory: Cognition and memory normal.        Judgment: Judgment normal.     Comments: Insight intact     Lab Review:     Component Value Date/Time   NA 140 05/26/2019 1046   K 3.4 (L) 05/26/2019 1046   CL 95 (L) 05/26/2019 1046   CO2 37 (H) 05/26/2019 1046   GLUCOSE 97 05/26/2019 1046   BUN 17 05/26/2019 1046   CREATININE 1.09 05/26/2019 1046   CALCIUM 9.3 05/26/2019 1046   PROT 6.9 05/26/2019 1046   ALBUMIN 4.2 05/26/2019 1046   AST 36 05/26/2019 1046   ALT 30 05/26/2019 1046   ALKPHOS 98 05/26/2019 1046   BILITOT 0.4 05/26/2019 1046       Component Value Date/Time   WBC 6.9 05/26/2019 1046   RBC 4.42 05/26/2019 1046   HGB 12.6 05/26/2019 1046   HCT 38.7 05/26/2019 1046   PLT 209.0 05/26/2019 1046   MCV 87.6 05/26/2019 1046   MCHC 32.5 05/26/2019 1046   RDW 14.6 05/26/2019 1046   LYMPHSABS 2.0 05/26/2019 1046   MONOABS 0.6 05/26/2019 1046   EOSABS 0.1 05/26/2019 1046   BASOSABS 0.0 05/26/2019 1046    No results found for: POCLITH, LITHIUM   No results found for: PHENYTOIN, PHENOBARB, VALPROATE, CBMZ   .res Assessment: Plan:     Plan:  1. Wellbutrin SR 164m in the am  2. Cymbalta 632mBID 3. Trazadone 10069m 2 at hs  4. Vraylar 1.5mg39mily   Continue therapy with Bambi Cottle.   RTC 2 months  Patient advised to contact office with any questions, adverse effects, or acute worsening in signs and symptoms.  Discussed potential metabolic side effects associated with atypical antipsychotics, as well as potential risk for movement side effects. Advised pt to contact office if movement side effects occur.    Diagnoses and all orders for  this visit:  Generalized anxiety disorder  PTSD (post-traumatic stress disorder)  Insomnia, unspecified type  Major depressive disorder, recurrent episode, moderate (HCC)  Episodic mood disorder (HCC)[place]  Please see After Visit Summary for patient specific instructions.  Future Appointments  Date Time Provider [place]  2:00 PM Cottle, BambLucious GrovesSW [facility] None  05/24/2020  3:00 PM Cottle, Bambi G, LCSW [facility] None  05/31/2020  2:00 PM Cottle, Bambi G, LCSW [facility] None  06/07/2020  3:00 PM Cottle, Bambi G, LCSW [facility] None  06/14/2020  2:00 PM Cottle, Bambi G, LCSW [facility] None    No orders of the defined types were placed in this encounter.   -------------------------------

## 2020-05-18 DIAGNOSIS — G4733 Obstructive sleep apnea (adult) (pediatric): Secondary | ICD-10-CM | POA: Diagnosis not present

## 2020-05-18 DIAGNOSIS — G43009 Migraine without aura, not intractable, without status migrainosus: Secondary | ICD-10-CM | POA: Diagnosis not present

## 2020-05-24 ENCOUNTER — Ambulatory Visit (INDEPENDENT_AMBULATORY_CARE_PROVIDER_SITE_OTHER): Payer: PPO | Admitting: Psychology

## 2020-05-24 DIAGNOSIS — F4310 Post-traumatic stress disorder, unspecified: Secondary | ICD-10-CM | POA: Diagnosis not present

## 2020-05-25 DIAGNOSIS — M4716 Other spondylosis with myelopathy, lumbar region: Secondary | ICD-10-CM | POA: Diagnosis not present

## 2020-05-25 DIAGNOSIS — G894 Chronic pain syndrome: Secondary | ICD-10-CM | POA: Diagnosis not present

## 2020-05-25 DIAGNOSIS — M461 Sacroiliitis, not elsewhere classified: Secondary | ICD-10-CM | POA: Diagnosis not present

## 2020-05-25 DIAGNOSIS — M797 Fibromyalgia: Secondary | ICD-10-CM | POA: Diagnosis not present

## 2020-05-25 DIAGNOSIS — M545 Low back pain: Secondary | ICD-10-CM | POA: Diagnosis not present

## 2020-05-31 ENCOUNTER — Ambulatory Visit (INDEPENDENT_AMBULATORY_CARE_PROVIDER_SITE_OTHER): Payer: PPO | Admitting: Psychology

## 2020-05-31 DIAGNOSIS — F4310 Post-traumatic stress disorder, unspecified: Secondary | ICD-10-CM

## 2020-06-01 ENCOUNTER — Telehealth: Payer: Self-pay | Admitting: Gastroenterology

## 2020-06-01 DIAGNOSIS — K5900 Constipation, unspecified: Secondary | ICD-10-CM | POA: Diagnosis not present

## 2020-06-01 DIAGNOSIS — M48061 Spinal stenosis, lumbar region without neurogenic claudication: Secondary | ICD-10-CM | POA: Diagnosis not present

## 2020-06-01 DIAGNOSIS — R509 Fever, unspecified: Secondary | ICD-10-CM | POA: Diagnosis not present

## 2020-06-01 DIAGNOSIS — M797 Fibromyalgia: Secondary | ICD-10-CM | POA: Diagnosis not present

## 2020-06-01 DIAGNOSIS — M545 Low back pain: Secondary | ICD-10-CM | POA: Diagnosis not present

## 2020-06-01 DIAGNOSIS — K219 Gastro-esophageal reflux disease without esophagitis: Secondary | ICD-10-CM | POA: Diagnosis not present

## 2020-06-01 DIAGNOSIS — E669 Obesity, unspecified: Secondary | ICD-10-CM | POA: Diagnosis not present

## 2020-06-01 DIAGNOSIS — G5603 Carpal tunnel syndrome, bilateral upper limbs: Secondary | ICD-10-CM | POA: Diagnosis not present

## 2020-06-01 DIAGNOSIS — D500 Iron deficiency anemia secondary to blood loss (chronic): Secondary | ICD-10-CM | POA: Diagnosis not present

## 2020-06-01 DIAGNOSIS — M48062 Spinal stenosis, lumbar region with neurogenic claudication: Secondary | ICD-10-CM | POA: Diagnosis not present

## 2020-06-01 DIAGNOSIS — M4800 Spinal stenosis, site unspecified: Secondary | ICD-10-CM | POA: Diagnosis not present

## 2020-06-01 DIAGNOSIS — R159 Full incontinence of feces: Secondary | ICD-10-CM | POA: Diagnosis not present

## 2020-06-01 DIAGNOSIS — M4326 Fusion of spine, lumbar region: Secondary | ICD-10-CM | POA: Diagnosis not present

## 2020-06-01 DIAGNOSIS — D72829 Elevated white blood cell count, unspecified: Secondary | ICD-10-CM | POA: Diagnosis not present

## 2020-06-01 DIAGNOSIS — Z20822 Contact with and (suspected) exposure to covid-19: Secondary | ICD-10-CM | POA: Diagnosis not present

## 2020-06-01 DIAGNOSIS — M4316 Spondylolisthesis, lumbar region: Secondary | ICD-10-CM | POA: Diagnosis not present

## 2020-06-01 DIAGNOSIS — Z4789 Encounter for other orthopedic aftercare: Secondary | ICD-10-CM | POA: Diagnosis not present

## 2020-06-01 DIAGNOSIS — R200 Anesthesia of skin: Secondary | ICD-10-CM | POA: Diagnosis not present

## 2020-06-01 DIAGNOSIS — M1612 Unilateral primary osteoarthritis, left hip: Secondary | ICD-10-CM | POA: Diagnosis not present

## 2020-06-01 DIAGNOSIS — M5416 Radiculopathy, lumbar region: Secondary | ICD-10-CM | POA: Diagnosis not present

## 2020-06-01 DIAGNOSIS — Z79891 Long term (current) use of opiate analgesic: Secondary | ICD-10-CM | POA: Diagnosis not present

## 2020-06-01 DIAGNOSIS — M5124 Other intervertebral disc displacement, thoracic region: Secondary | ICD-10-CM | POA: Diagnosis not present

## 2020-06-01 DIAGNOSIS — M4807 Spinal stenosis, lumbosacral region: Secondary | ICD-10-CM | POA: Diagnosis not present

## 2020-06-01 DIAGNOSIS — M542 Cervicalgia: Secondary | ICD-10-CM | POA: Diagnosis not present

## 2020-06-01 DIAGNOSIS — F419 Anxiety disorder, unspecified: Secondary | ICD-10-CM | POA: Diagnosis not present

## 2020-06-01 DIAGNOSIS — M4726 Other spondylosis with radiculopathy, lumbar region: Secondary | ICD-10-CM | POA: Diagnosis not present

## 2020-06-01 DIAGNOSIS — F339 Major depressive disorder, recurrent, unspecified: Secondary | ICD-10-CM | POA: Diagnosis not present

## 2020-06-01 DIAGNOSIS — D509 Iron deficiency anemia, unspecified: Secondary | ICD-10-CM | POA: Diagnosis not present

## 2020-06-01 DIAGNOSIS — M25552 Pain in left hip: Secondary | ICD-10-CM | POA: Diagnosis not present

## 2020-06-01 DIAGNOSIS — R339 Retention of urine, unspecified: Secondary | ICD-10-CM | POA: Diagnosis not present

## 2020-06-01 DIAGNOSIS — M5116 Intervertebral disc disorders with radiculopathy, lumbar region: Secondary | ICD-10-CM | POA: Diagnosis not present

## 2020-06-01 DIAGNOSIS — M5127 Other intervertebral disc displacement, lumbosacral region: Secondary | ICD-10-CM | POA: Diagnosis not present

## 2020-06-01 DIAGNOSIS — G4700 Insomnia, unspecified: Secondary | ICD-10-CM | POA: Diagnosis not present

## 2020-06-01 DIAGNOSIS — M47816 Spondylosis without myelopathy or radiculopathy, lumbar region: Secondary | ICD-10-CM | POA: Diagnosis not present

## 2020-06-01 DIAGNOSIS — G834 Cauda equina syndrome: Secondary | ICD-10-CM | POA: Diagnosis not present

## 2020-06-01 DIAGNOSIS — G4733 Obstructive sleep apnea (adult) (pediatric): Secondary | ICD-10-CM | POA: Diagnosis not present

## 2020-06-01 DIAGNOSIS — Z978 Presence of other specified devices: Secondary | ICD-10-CM | POA: Diagnosis not present

## 2020-06-01 DIAGNOSIS — D62 Acute posthemorrhagic anemia: Secondary | ICD-10-CM | POA: Diagnosis not present

## 2020-06-01 DIAGNOSIS — D649 Anemia, unspecified: Secondary | ICD-10-CM | POA: Diagnosis not present

## 2020-06-01 DIAGNOSIS — Z981 Arthrodesis status: Secondary | ICD-10-CM | POA: Diagnosis not present

## 2020-06-01 DIAGNOSIS — Z79890 Hormone replacement therapy (postmenopausal): Secondary | ICD-10-CM | POA: Diagnosis not present

## 2020-06-01 DIAGNOSIS — F4310 Post-traumatic stress disorder, unspecified: Secondary | ICD-10-CM | POA: Diagnosis not present

## 2020-06-01 DIAGNOSIS — G43909 Migraine, unspecified, not intractable, without status migrainosus: Secondary | ICD-10-CM | POA: Diagnosis not present

## 2020-06-01 DIAGNOSIS — M961 Postlaminectomy syndrome, not elsewhere classified: Secondary | ICD-10-CM | POA: Diagnosis not present

## 2020-06-01 DIAGNOSIS — Z6834 Body mass index (BMI) 34.0-34.9, adult: Secondary | ICD-10-CM | POA: Diagnosis not present

## 2020-06-01 DIAGNOSIS — E039 Hypothyroidism, unspecified: Secondary | ICD-10-CM | POA: Diagnosis not present

## 2020-06-01 DIAGNOSIS — M79605 Pain in left leg: Secondary | ICD-10-CM | POA: Diagnosis not present

## 2020-06-01 DIAGNOSIS — G8929 Other chronic pain: Secondary | ICD-10-CM | POA: Diagnosis not present

## 2020-06-01 DIAGNOSIS — M5126 Other intervertebral disc displacement, lumbar region: Secondary | ICD-10-CM | POA: Diagnosis not present

## 2020-06-01 DIAGNOSIS — R000 Tachycardia, unspecified: Secondary | ICD-10-CM | POA: Diagnosis not present

## 2020-06-01 DIAGNOSIS — Z7409 Other reduced mobility: Secondary | ICD-10-CM | POA: Diagnosis not present

## 2020-06-01 DIAGNOSIS — R338 Other retention of urine: Secondary | ICD-10-CM | POA: Diagnosis not present

## 2020-06-01 DIAGNOSIS — F329 Major depressive disorder, single episode, unspecified: Secondary | ICD-10-CM | POA: Diagnosis not present

## 2020-06-01 DIAGNOSIS — Z79899 Other long term (current) drug therapy: Secondary | ICD-10-CM | POA: Diagnosis not present

## 2020-06-01 DIAGNOSIS — M532X6 Spinal instabilities, lumbar region: Secondary | ICD-10-CM | POA: Diagnosis not present

## 2020-06-01 DIAGNOSIS — Z9884 Bariatric surgery status: Secondary | ICD-10-CM | POA: Diagnosis not present

## 2020-06-01 DIAGNOSIS — Z9889 Other specified postprocedural states: Secondary | ICD-10-CM | POA: Diagnosis not present

## 2020-06-01 DIAGNOSIS — G894 Chronic pain syndrome: Secondary | ICD-10-CM | POA: Diagnosis not present

## 2020-06-01 DIAGNOSIS — G2581 Restless legs syndrome: Secondary | ICD-10-CM | POA: Diagnosis not present

## 2020-06-01 NOTE — Telephone Encounter (Signed)
Spoke with patient and she states she started having incontinence of stool after receiving a Toradol shot last wee for her spinal issues. Reports incontinence of stool x 4 during her sleep last week and continues to ooze stool when urinates or passes gas. She is using Miralax. Reports stools are firm when she has the urge to have a stool. During the course of the conversation, she reports that he spine doctor has recommended she go to the ED and needs to have a scan due to the symptoms. Patient instructed to follow the recommendations of her spine physician. She states she will do so.

## 2020-06-01 NOTE — Telephone Encounter (Signed)
Patient calling states she has been having bowel incontinence for a week now seeking advise

## 2020-06-02 DIAGNOSIS — M5127 Other intervertebral disc displacement, lumbosacral region: Secondary | ICD-10-CM | POA: Diagnosis not present

## 2020-06-02 DIAGNOSIS — M5124 Other intervertebral disc displacement, thoracic region: Secondary | ICD-10-CM | POA: Diagnosis not present

## 2020-06-02 DIAGNOSIS — M4807 Spinal stenosis, lumbosacral region: Secondary | ICD-10-CM | POA: Diagnosis not present

## 2020-06-07 ENCOUNTER — Ambulatory Visit: Payer: PPO | Admitting: Psychology

## 2020-06-09 ENCOUNTER — Telehealth: Payer: Self-pay | Admitting: Adult Health

## 2020-06-09 NOTE — Telephone Encounter (Signed)
Ok to give samples

## 2020-06-09 NOTE — Telephone Encounter (Signed)
Paige [name] called to request more samples of Vraylar.  She said Barnett Applebaum has been giving her samples because it is too expensive to buy.  I have pulled to boxes of the 1.5mg  for her.  She has appt 8/16.  She will need more samples if we are indeed supplying her with samples but I don't see any notes about this so need instruction to give her more.

## 2020-06-13 DIAGNOSIS — G4700 Insomnia, unspecified: Secondary | ICD-10-CM | POA: Diagnosis not present

## 2020-06-13 DIAGNOSIS — D62 Acute posthemorrhagic anemia: Secondary | ICD-10-CM | POA: Diagnosis not present

## 2020-06-13 DIAGNOSIS — G4733 Obstructive sleep apnea (adult) (pediatric): Secondary | ICD-10-CM | POA: Diagnosis not present

## 2020-06-13 DIAGNOSIS — Z9884 Bariatric surgery status: Secondary | ICD-10-CM | POA: Diagnosis not present

## 2020-06-13 DIAGNOSIS — D500 Iron deficiency anemia secondary to blood loss (chronic): Secondary | ICD-10-CM | POA: Diagnosis not present

## 2020-06-13 DIAGNOSIS — D509 Iron deficiency anemia, unspecified: Secondary | ICD-10-CM | POA: Diagnosis not present

## 2020-06-13 DIAGNOSIS — G894 Chronic pain syndrome: Secondary | ICD-10-CM | POA: Diagnosis not present

## 2020-06-13 DIAGNOSIS — F4310 Post-traumatic stress disorder, unspecified: Secondary | ICD-10-CM | POA: Diagnosis not present

## 2020-06-13 DIAGNOSIS — G43909 Migraine, unspecified, not intractable, without status migrainosus: Secondary | ICD-10-CM | POA: Diagnosis not present

## 2020-06-13 DIAGNOSIS — R339 Retention of urine, unspecified: Secondary | ICD-10-CM | POA: Diagnosis not present

## 2020-06-13 DIAGNOSIS — G5603 Carpal tunnel syndrome, bilateral upper limbs: Secondary | ICD-10-CM | POA: Diagnosis not present

## 2020-06-13 DIAGNOSIS — M542 Cervicalgia: Secondary | ICD-10-CM | POA: Diagnosis not present

## 2020-06-13 DIAGNOSIS — M4800 Spinal stenosis, site unspecified: Secondary | ICD-10-CM | POA: Diagnosis not present

## 2020-06-13 DIAGNOSIS — K219 Gastro-esophageal reflux disease without esophagitis: Secondary | ICD-10-CM | POA: Diagnosis not present

## 2020-06-13 DIAGNOSIS — F339 Major depressive disorder, recurrent, unspecified: Secondary | ICD-10-CM | POA: Diagnosis not present

## 2020-06-13 DIAGNOSIS — M797 Fibromyalgia: Secondary | ICD-10-CM | POA: Diagnosis not present

## 2020-06-13 DIAGNOSIS — M4326 Fusion of spine, lumbar region: Secondary | ICD-10-CM | POA: Diagnosis not present

## 2020-06-13 DIAGNOSIS — Z4789 Encounter for other orthopedic aftercare: Secondary | ICD-10-CM | POA: Diagnosis not present

## 2020-06-13 DIAGNOSIS — G2581 Restless legs syndrome: Secondary | ICD-10-CM | POA: Diagnosis not present

## 2020-06-13 DIAGNOSIS — M545 Low back pain: Secondary | ICD-10-CM | POA: Diagnosis not present

## 2020-06-13 DIAGNOSIS — F419 Anxiety disorder, unspecified: Secondary | ICD-10-CM | POA: Diagnosis not present

## 2020-06-13 DIAGNOSIS — G834 Cauda equina syndrome: Secondary | ICD-10-CM | POA: Diagnosis not present

## 2020-06-13 DIAGNOSIS — M48061 Spinal stenosis, lumbar region without neurogenic claudication: Secondary | ICD-10-CM | POA: Diagnosis not present

## 2020-06-13 DIAGNOSIS — M25552 Pain in left hip: Secondary | ICD-10-CM | POA: Diagnosis not present

## 2020-06-13 DIAGNOSIS — M5116 Intervertebral disc disorders with radiculopathy, lumbar region: Secondary | ICD-10-CM | POA: Diagnosis not present

## 2020-06-13 DIAGNOSIS — R5381 Other malaise: Secondary | ICD-10-CM | POA: Diagnosis not present

## 2020-06-13 DIAGNOSIS — M4316 Spondylolisthesis, lumbar region: Secondary | ICD-10-CM | POA: Diagnosis not present

## 2020-06-13 DIAGNOSIS — E039 Hypothyroidism, unspecified: Secondary | ICD-10-CM | POA: Diagnosis not present

## 2020-06-14 ENCOUNTER — Ambulatory Visit: Payer: PPO | Admitting: Psychology

## 2020-06-20 DIAGNOSIS — M545 Low back pain: Secondary | ICD-10-CM | POA: Diagnosis not present

## 2020-06-20 DIAGNOSIS — M797 Fibromyalgia: Secondary | ICD-10-CM | POA: Diagnosis not present

## 2020-06-20 DIAGNOSIS — G834 Cauda equina syndrome: Secondary | ICD-10-CM | POA: Diagnosis not present

## 2020-06-20 DIAGNOSIS — M5116 Intervertebral disc disorders with radiculopathy, lumbar region: Secondary | ICD-10-CM | POA: Diagnosis not present

## 2020-06-20 DIAGNOSIS — G894 Chronic pain syndrome: Secondary | ICD-10-CM | POA: Diagnosis not present

## 2020-06-20 DIAGNOSIS — M4326 Fusion of spine, lumbar region: Secondary | ICD-10-CM | POA: Diagnosis not present

## 2020-06-20 DIAGNOSIS — M4800 Spinal stenosis, site unspecified: Secondary | ICD-10-CM | POA: Diagnosis not present

## 2020-06-20 DIAGNOSIS — R5381 Other malaise: Secondary | ICD-10-CM | POA: Diagnosis not present

## 2020-06-21 ENCOUNTER — Ambulatory Visit: Payer: PPO | Admitting: Psychology

## 2020-06-22 DIAGNOSIS — M545 Low back pain: Secondary | ICD-10-CM | POA: Diagnosis not present

## 2020-06-22 DIAGNOSIS — R5381 Other malaise: Secondary | ICD-10-CM | POA: Diagnosis not present

## 2020-06-22 DIAGNOSIS — G894 Chronic pain syndrome: Secondary | ICD-10-CM | POA: Diagnosis not present

## 2020-06-22 DIAGNOSIS — M4800 Spinal stenosis, site unspecified: Secondary | ICD-10-CM | POA: Diagnosis not present

## 2020-06-22 DIAGNOSIS — M797 Fibromyalgia: Secondary | ICD-10-CM | POA: Diagnosis not present

## 2020-06-22 DIAGNOSIS — M5116 Intervertebral disc disorders with radiculopathy, lumbar region: Secondary | ICD-10-CM | POA: Diagnosis not present

## 2020-06-22 DIAGNOSIS — M4326 Fusion of spine, lumbar region: Secondary | ICD-10-CM | POA: Diagnosis not present

## 2020-06-22 DIAGNOSIS — G834 Cauda equina syndrome: Secondary | ICD-10-CM | POA: Diagnosis not present

## 2020-06-23 ENCOUNTER — Other Ambulatory Visit: Payer: Self-pay | Admitting: Adult Health

## 2020-06-23 DIAGNOSIS — F331 Major depressive disorder, recurrent, moderate: Secondary | ICD-10-CM

## 2020-06-23 DIAGNOSIS — F4310 Post-traumatic stress disorder, unspecified: Secondary | ICD-10-CM

## 2020-06-23 DIAGNOSIS — F411 Generalized anxiety disorder: Secondary | ICD-10-CM

## 2020-06-23 DIAGNOSIS — G4700 Insomnia, unspecified: Secondary | ICD-10-CM

## 2020-06-25 DIAGNOSIS — Z981 Arthrodesis status: Secondary | ICD-10-CM | POA: Diagnosis not present

## 2020-06-25 DIAGNOSIS — F419 Anxiety disorder, unspecified: Secondary | ICD-10-CM | POA: Diagnosis not present

## 2020-06-25 DIAGNOSIS — G4700 Insomnia, unspecified: Secondary | ICD-10-CM | POA: Diagnosis not present

## 2020-06-25 DIAGNOSIS — F339 Major depressive disorder, recurrent, unspecified: Secondary | ICD-10-CM | POA: Diagnosis not present

## 2020-06-25 DIAGNOSIS — F4310 Post-traumatic stress disorder, unspecified: Secondary | ICD-10-CM | POA: Diagnosis not present

## 2020-06-25 DIAGNOSIS — Z6834 Body mass index (BMI) 34.0-34.9, adult: Secondary | ICD-10-CM | POA: Diagnosis not present

## 2020-06-25 DIAGNOSIS — M797 Fibromyalgia: Secondary | ICD-10-CM | POA: Diagnosis not present

## 2020-06-25 DIAGNOSIS — E538 Deficiency of other specified B group vitamins: Secondary | ICD-10-CM | POA: Diagnosis not present

## 2020-06-25 DIAGNOSIS — M48062 Spinal stenosis, lumbar region with neurogenic claudication: Secondary | ICD-10-CM | POA: Diagnosis not present

## 2020-06-25 DIAGNOSIS — M5116 Intervertebral disc disorders with radiculopathy, lumbar region: Secondary | ICD-10-CM | POA: Diagnosis not present

## 2020-06-25 DIAGNOSIS — E039 Hypothyroidism, unspecified: Secondary | ICD-10-CM | POA: Diagnosis not present

## 2020-06-25 DIAGNOSIS — G834 Cauda equina syndrome: Secondary | ICD-10-CM | POA: Diagnosis not present

## 2020-06-25 DIAGNOSIS — Z4789 Encounter for other orthopedic aftercare: Secondary | ICD-10-CM | POA: Diagnosis not present

## 2020-06-25 DIAGNOSIS — E669 Obesity, unspecified: Secondary | ICD-10-CM | POA: Diagnosis not present

## 2020-06-25 DIAGNOSIS — N809 Endometriosis, unspecified: Secondary | ICD-10-CM | POA: Diagnosis not present

## 2020-06-25 DIAGNOSIS — M4726 Other spondylosis with radiculopathy, lumbar region: Secondary | ICD-10-CM | POA: Diagnosis not present

## 2020-06-25 DIAGNOSIS — Z9682 Presence of neurostimulator: Secondary | ICD-10-CM | POA: Diagnosis not present

## 2020-06-25 DIAGNOSIS — Z79899 Other long term (current) drug therapy: Secondary | ICD-10-CM | POA: Diagnosis not present

## 2020-06-25 DIAGNOSIS — M1990 Unspecified osteoarthritis, unspecified site: Secondary | ICD-10-CM | POA: Diagnosis not present

## 2020-06-25 DIAGNOSIS — G2581 Restless legs syndrome: Secondary | ICD-10-CM | POA: Diagnosis not present

## 2020-06-25 DIAGNOSIS — G4733 Obstructive sleep apnea (adult) (pediatric): Secondary | ICD-10-CM | POA: Diagnosis not present

## 2020-06-25 DIAGNOSIS — D509 Iron deficiency anemia, unspecified: Secondary | ICD-10-CM | POA: Diagnosis not present

## 2020-06-25 DIAGNOSIS — G43909 Migraine, unspecified, not intractable, without status migrainosus: Secondary | ICD-10-CM | POA: Diagnosis not present

## 2020-06-27 DIAGNOSIS — Z79899 Other long term (current) drug therapy: Secondary | ICD-10-CM | POA: Diagnosis not present

## 2020-06-27 DIAGNOSIS — Z09 Encounter for follow-up examination after completed treatment for conditions other than malignant neoplasm: Secondary | ICD-10-CM | POA: Diagnosis not present

## 2020-07-05 ENCOUNTER — Ambulatory Visit (INDEPENDENT_AMBULATORY_CARE_PROVIDER_SITE_OTHER): Payer: PPO | Admitting: Psychology

## 2020-07-05 DIAGNOSIS — G834 Cauda equina syndrome: Secondary | ICD-10-CM | POA: Diagnosis not present

## 2020-07-05 DIAGNOSIS — M48062 Spinal stenosis, lumbar region with neurogenic claudication: Secondary | ICD-10-CM | POA: Diagnosis not present

## 2020-07-05 DIAGNOSIS — Z4789 Encounter for other orthopedic aftercare: Secondary | ICD-10-CM | POA: Diagnosis not present

## 2020-07-05 DIAGNOSIS — F4310 Post-traumatic stress disorder, unspecified: Secondary | ICD-10-CM

## 2020-07-05 DIAGNOSIS — M5116 Intervertebral disc disorders with radiculopathy, lumbar region: Secondary | ICD-10-CM | POA: Diagnosis not present

## 2020-07-12 ENCOUNTER — Ambulatory Visit (INDEPENDENT_AMBULATORY_CARE_PROVIDER_SITE_OTHER): Payer: PPO | Admitting: Psychology

## 2020-07-12 DIAGNOSIS — F4310 Post-traumatic stress disorder, unspecified: Secondary | ICD-10-CM | POA: Diagnosis not present

## 2020-07-12 DIAGNOSIS — M4326 Fusion of spine, lumbar region: Secondary | ICD-10-CM | POA: Diagnosis not present

## 2020-07-17 ENCOUNTER — Encounter: Payer: Self-pay | Admitting: Adult Health

## 2020-07-17 ENCOUNTER — Ambulatory Visit (INDEPENDENT_AMBULATORY_CARE_PROVIDER_SITE_OTHER): Payer: PPO | Admitting: Adult Health

## 2020-07-17 ENCOUNTER — Other Ambulatory Visit: Payer: Self-pay

## 2020-07-17 DIAGNOSIS — F411 Generalized anxiety disorder: Secondary | ICD-10-CM | POA: Diagnosis not present

## 2020-07-17 DIAGNOSIS — F331 Major depressive disorder, recurrent, moderate: Secondary | ICD-10-CM | POA: Diagnosis not present

## 2020-07-17 DIAGNOSIS — G4700 Insomnia, unspecified: Secondary | ICD-10-CM | POA: Diagnosis not present

## 2020-07-17 DIAGNOSIS — F4310 Post-traumatic stress disorder, unspecified: Secondary | ICD-10-CM | POA: Diagnosis not present

## 2020-07-17 MED ORDER — BUPROPION HCL ER (XL) 150 MG PO TB24: 150.0000 mg | ORAL_TABLET | Freq: Every day | ORAL | 5 refills | Status: DC

## 2020-07-17 MED ORDER — TRAZODONE HCL 100 MG PO TABS: 200.0000 mg | ORAL_TABLET | Freq: Every day | ORAL | 5 refills | Status: DC

## 2020-07-17 MED ORDER — DULOXETINE HCL 60 MG PO CPEP: ORAL_CAPSULE | ORAL | 5 refills | Status: DC

## 2020-07-17 NOTE — Progress Notes (Signed)
Paige [name] [id number] 10/10/1966 54 y.o.  Subjective:   Patient ID:  Paige [name] is a 54 y.o. (DOB 03-Apr-1966) female.  Chief Complaint: No chief complaint on file.   HPI Paige [name] presents to the office today for follow-up of GAD, MDD, PTSD, insomnia.  Describes mood today as "ok". Pleasant. Mood symptoms - reports depression, anxiety, and irritability - "can be bad at times". Stating - "I'm doing alright". Recent surgery in July - laminectomy - rehab facility. Stating "the month of July was horrible". Fair interest and motivation. Continues to see therapist Chiropractor) every week for PTSD. Taking medications as prescribed.  Energy levels "decreased". Active, walking daily - "not much with hip pain".  Enjoys some usual interests and activities. Single. Lives alone with 3 cats - and one outside. Dating - has met someone in [place]. Getting together now and then - together for 5 months. Mother local and supportive. Appetite adequate. Weight stable - 230 pounds.  Sleeps well most nights. Averages 3 to 4 hours. Recovering from surgery and has a new bed. Slept 7 hours last night. Naps during the day "sometimes". Focus and concentration "pretty good". Completing some household tasks. Manages household tasks - "not as much as I would like. Disabled since 2009. Denies SI or HI. Denies AH or VH.  Previous medication trials: Lexapro, Paxil, Trazadone, Wellbutrin SR, Geodon, Seroquel, Risperdal, Depakote, Topamax, Lithium, Provigil, Ambien, Cytomel, Clonazepam and other benzodixepaines, Elavil, Effexor, remeron, Prozac, Anafranil, Tofranil, Zoloft, Geodon, Buspar, Adderall, Ritalin, Abilify, Rexulti, Lamictal, Latuda    Mini-Mental     Office Visit from 04/07/2018 in [facility] [place]  Total Score (max 30 points ) 29    PHQ2-9     Patient Outreach from 05/22/2018 in [facility] Patient Outreach from 05/13/2018 in [facility]  Patient Outreach Telephone from 04/30/2018 in [place]  PHQ-2 Total Score _0 PHQ-9 Total Score _1 Review of Systems:  Review of Systems  Musculoskeletal: Negative for gait problem.  Neurological: Negative for tremors.  Psychiatric/Behavioral:       Please refer to HPI    Medications: I have reviewed the patient's current medications.  Current Outpatient Medications  Medication Sig Dispense Refill   buPROPion (WELLBUTRIN XL) 150 MG 24 hr tablet Take 1 tablet (150 mg total) by mouth daily. 30 tablet 5   cariprazine (VRAYLAR) capsule Take 1 capsule (1.5 mg total) by mouth daily. 28 capsule 0   dicyclomine (BENTYL) 10 MG capsule Take 1 capsule (10 mg total) by mouth 3 (three) times daily as needed for spasms. 90 capsule 11   DULoxetine (CYMBALTA) 60 MG capsule Take one capsule twice daily. 60 capsule 5   Erenumab-aooe (AIMOVIG) 70 MG/ML SOAJ Inject 70 mg into the skin every 28 (twenty-eight) days. 1 pen 11   Fe Fum-Fe Poly-Vit C-Lactobac (FUSION) 65-65-25-30 MG CAPS Take by mouth.     Ferrous Sulfate Dried (SLOW RELEASE IRON) 45 MG TBCR Take 1 tablet by mouth daily.     ipratropium (ATROVENT) 0.03 % nasal spray Place 2 sprays into both nostrils as needed.  30 mL 12   ipratropium (ATROVENT) 0.06 % nasal spray INSTILL 2 SPRAYS INTO EACH NOSTRIL AS NEEDED TWICE A DAY     ketoconazole (NIZORAL) 2 % shampoo Apply topically.     lamoTRIgine (LAMICTAL) 25 MG tablet Take by mouth.     levothyroxine (SYNTHROID, LEVOTHROID) 88 MCG tablet Take 88  mcg by mouth daily before breakfast.     methocarbamol (ROBAXIN) 500 MG tablet Take 500 mg by mouth 3 (three) times daily.     nystatin cream (MYCOSTATIN) Apply 1 application topically daily as needed for dry skin.     omeprazole (PRILOSEC) 20 MG capsule      ondansetron (ZOFRAN-ODT) 4 MG disintegrating tablet Take 4 mg by mouth every 8 (eight) hours as needed for nausea or vomiting.     Oxycodone HCl 10 MG  TABS Take 10 mg by mouth every 6 (six) hours as needed.     Polyethyl Glycol-Propyl Glycol (SYSTANE) 0.4-0.3 % SOLN Apply 1 drop to eye as needed.     polyethylene glycol (MIRALAX / GLYCOLAX) 17 g packet Take 17 g by mouth 2 (two) times daily.     potassium chloride (K-DUR,KLOR-CON) 10 MEQ tablet Take 1 tablet by mouth daily.     potassium chloride (KLOR-CON) 10 MEQ tablet Take by mouth.     promethazine (PHENERGAN) 12.5 MG tablet Take 1 tablet (12.5 mg total) by mouth every 8 (eight) hours as needed for nausea or vomiting. 30 tablet 0   rizatriptan (MAXALT) 10 MG tablet rizatriptan 10 mg tablet  TAKE 1 TABLET AT EARLIEST ONSET OF MIGRAINE. MAY REPEAT ONCE IN 2 HOURS IF NEEDED     rOPINIRole (REQUIP) 2 MG tablet Take 2 mg by mouth 2 (two) times daily.     terconazole (TERAZOL 3) 0.8 % vaginal cream Place 1 applicator vaginally at bedtime.     tiZANidine (ZANAFLEX) 2 MG tablet Take 2 mg by mouth 3 (three) times daily.     torsemide (DEMADEX) 20 MG tablet Take 20 mg by mouth daily.     traZODone (DESYREL) 100 MG tablet Take 2 tablets (200 mg total) by mouth at bedtime. 60 tablet 5   Ubrogepant (UBRELVY) 100 MG TABS Take 1 tablet by mouth daily as needed (at onset of headaches may repeat dose after 2 hours as needed max 200 mg in 24 hours). 16 tablet 3   Current Facility-Administered Medications  Medication Dose Route Frequency Provider Last Rate Last Admin   0.9 %  sodium chloride infusion  500 mL Intravenous Once Ladene Artist, MD        Medication Side Effects: None  Allergies:  Allergies  Allergen Reactions   Oxycodone-Acetaminophen Hives   Topiramate Shortness Of Breath and Other (See Comments)    Really wierd feeling Off balance; cloudy thinking Really wierd feeling   Cyclobenzaprine Other (See Comments)    Other reaction(s): Unknown Hallucinations Unknown    Meloxicam Other (See Comments)   Morphine Itching    Other reaction(s): Unknown  ; has tolerated  Diluadid per MAR   Naltrexone Other (See Comments)    SEVERE PAIN ALL OVER   Pork (Porcine) Protein Other (See Comments)    Other reaction(s): Other (See Comments) Migraines Other reaction(s): Other (See Comments) Migraines   Sumatriptan Other (See Comments)    Difficulty breathing Other reaction(s): Other (see comments) Off balance    Tape Dermatitis   Flexeril [Cyclobenzaprine Hcl]    Imitrex [Sumatriptan Base]    Morphine And Related    Oxycodone Hcl    Percocet [Oxycodone-Acetaminophen]     Past Medical History:  Diagnosis Date   Allergic rhinitis    Anemia    Anxiety disorder    Chronic fatigue    Chronic headaches    Chronic pain    Depression    Endometriosis  Family history of colon cancer    mother,uncles,aunts   Fibromyalgia    Gallstones    GERD (gastroesophageal reflux disease)    Hypothyroidism    IBS (irritable bowel syndrome)    Insomnia    Iron deficiency    Laryngopharyngeal reflux (LPR)    Obesity    Personal history of colonic polyps 04/2006   polypoid   PONV (postoperative nausea and vomiting)    RLS (restless legs syndrome)     Family History  Problem Relation Age of Onset   Colon cancer Mother        uncles,aunts   Diabetes Father        paternal grandmother,sister   Leukemia Father    Lymphoma Father    Skin cancer Father    Heart disease Father    Diabetes Sister    Stomach cancer Neg Hx    Esophageal cancer Neg Hx    Rectal cancer Neg Hx     Social History   Socioeconomic History   Marital status: Single    Spouse name: Not on file   Number of children: 0   Years of education: Not on file   Highest education level: Not on file  Occupational History   Occupation: RN/disabled  Tobacco Use   Smoking status: Never Smoker   Smokeless tobacco: Never Used  Scientific laboratory technician Use: Never used  Substance and Sexual Activity   Alcohol use: Not Currently    Alcohol/week:  0.0 standard drinks    Comment: rarely   Drug use: No   Sexual activity: Not on file  Other Topics Concern   Not on file  Social History Narrative   Patient is left-handed. She lives alone in a one level home. She has 3 cats. She is unable to exercise. Caffeine one soda / day. On disability now. 2 bachelor degrees   Social Determinants of Health   Financial Resource Strain:    Difficulty of Paying Living Expenses:   Food Insecurity:    Worried About Charity fundraiser in the Last Year:    Arboriculturist in the Last Year:   Transportation Needs:    Film/video editor (Medical):    Lack of Transportation (Non-Medical):   Physical Activity:    Days of Exercise per Week:    Minutes of Exercise per Session:   Stress:    Feeling of Stress :   Social Connections:    Frequency of Communication with Friends and Family:    Frequency of Social Gatherings with Friends and Family:    Attends Religious Services:    Active Member of Clubs or Organizations:    Attends Music therapist:    Marital Status:   Intimate Partner Violence:    Fear of Current or Ex-Partner:    Emotionally Abused:    Physically Abused:    Sexually Abused:     Past Medical History, Surgical history, Social history, and Family history were reviewed and updated as appropriate.   Please see review of systems for further details on the patient's review from today.   Objective:   Physical Exam:  There were no vitals taken for this visit.  Physical Exam Constitutional:      General: She is not in acute distress. Musculoskeletal:        General: No deformity.  Neurological:     Mental Status: She is alert and oriented to person, place, and time.     Coordination:  Coordination normal.  Psychiatric:        Attention and Perception: Attention and perception normal. She does not perceive auditory or visual hallucinations.        Mood and Affect: Mood is anxious and  depressed. Affect is not labile, blunt, angry or inappropriate.        Speech: Speech normal.        Behavior: Behavior normal.        Thought Content: Thought content normal. Thought content is not paranoid or delusional. Thought content does not include homicidal or suicidal ideation. Thought content does not include homicidal or suicidal plan.        Cognition and Memory: Cognition and memory normal.        Judgment: Judgment normal.     Comments: Insight intact     Lab Review:     Component Value Date/Time   NA 140 05/26/2019 1046   K 3.4 (L) 05/26/2019 1046   CL 95 (L) 05/26/2019 1046   CO2 37 (H) 05/26/2019 1046   GLUCOSE 97 05/26/2019 1046   BUN 17 05/26/2019 1046   CREATININE 1.09 05/26/2019 1046   CALCIUM 9.3 05/26/2019 1046   PROT 6.9 05/26/2019 1046   ALBUMIN 4.2 05/26/2019 1046   AST 36 05/26/2019 1046   ALT 30 05/26/2019 1046   ALKPHOS 98 05/26/2019 1046   BILITOT 0.4 05/26/2019 1046       Component Value Date/Time   WBC 6.9 05/26/2019 1046   RBC 4.42 05/26/2019 1046   HGB 12.6 05/26/2019 1046   HCT 38.7 05/26/2019 1046   PLT 209.0 05/26/2019 1046   MCV 87.6 05/26/2019 1046   MCHC 32.5 05/26/2019 1046   RDW 14.6 05/26/2019 1046   LYMPHSABS 2.0 05/26/2019 1046   MONOABS 0.6 05/26/2019 1046   EOSABS 0.1 05/26/2019 1046   BASOSABS 0.0 05/26/2019 1046    No results found for: POCLITH, LITHIUM   No results found for: PHENYTOIN, PHENOBARB, VALPROATE, CBMZ   .res Assessment: Plan:    Plan:  1. Wellbutrin SR 126m in the am  2. Cymbalta 634mBID 3. Trazadone 10079m 2 at hs  4. Vraylar 1.5mg28mily - samples given 6 weeks.  Continue therapy with Bambi Cottle.   RTC 3 months  Patient advised to contact office with any questions, adverse effects, or acute worsening in signs and symptoms.  Discussed potential metabolic side effects associated with atypical antipsychotics, as well as potential risk for movement side effects. Advised pt to contact  office if movement side effects occur.   Diagnoses and all orders for this visit:  Insomnia, unspecified type -     traZODone (DESYREL) 100 MG tablet; Take 2 tablets (200 mg total) by mouth at bedtime. -     DULoxetine (CYMBALTA) 60 MG capsule; Take one capsule twice daily.  Major depressive disorder, recurrent episode, moderate (HCC) -     traZODone (DESYREL) 100 MG tablet; Take 2 tablets (200 mg total) by mouth at bedtime. -     DULoxetine (CYMBALTA) 60 MG capsule; Take one capsule twice daily. -     buPROPion (WELLBUTRIN XL) 150 MG 24 hr tablet; Take 1 tablet (150 mg total) by mouth daily.  Generalized anxiety disorder -     DULoxetine (CYMBALTA) 60 MG capsule; Take one capsule twice daily. -     buPROPion (WELLBUTRIN XL) 150 MG 24 hr tablet; Take 1 tablet (150 mg total) by mouth daily.  PTSD (post-traumatic stress disorder) -  DULoxetine (CYMBALTA) 60 MG capsule; Take one capsule twice daily.     Please see After Visit Summary for patient specific instructions.  Future Appointments  Date Time Provider [place]  07/19/2020  3:00 PM Cottle, Lucious [place], LCSW [facility] None  07/26/2020  2:00 PM Cottle, Bambi G, LCSW [facility] None  08/02/2020  3:00 PM Cottle, Bambi G, LCSW [facility] None  08/09/2020  2:00 PM Cottle, Bambi G, LCSW [facility] None    No orders of the defined types were placed in this encounter.   -------------------------------

## 2020-07-19 ENCOUNTER — Ambulatory Visit: Payer: PPO | Admitting: Psychology

## 2020-07-25 DIAGNOSIS — E538 Deficiency of other specified B group vitamins: Secondary | ICD-10-CM | POA: Diagnosis not present

## 2020-07-25 DIAGNOSIS — F4310 Post-traumatic stress disorder, unspecified: Secondary | ICD-10-CM | POA: Diagnosis not present

## 2020-07-25 DIAGNOSIS — Z4789 Encounter for other orthopedic aftercare: Secondary | ICD-10-CM | POA: Diagnosis not present

## 2020-07-25 DIAGNOSIS — M797 Fibromyalgia: Secondary | ICD-10-CM | POA: Diagnosis not present

## 2020-07-25 DIAGNOSIS — Z981 Arthrodesis status: Secondary | ICD-10-CM | POA: Diagnosis not present

## 2020-07-25 DIAGNOSIS — F419 Anxiety disorder, unspecified: Secondary | ICD-10-CM | POA: Diagnosis not present

## 2020-07-25 DIAGNOSIS — G43909 Migraine, unspecified, not intractable, without status migrainosus: Secondary | ICD-10-CM | POA: Diagnosis not present

## 2020-07-25 DIAGNOSIS — G2581 Restless legs syndrome: Secondary | ICD-10-CM | POA: Diagnosis not present

## 2020-07-25 DIAGNOSIS — Z9682 Presence of neurostimulator: Secondary | ICD-10-CM | POA: Diagnosis not present

## 2020-07-25 DIAGNOSIS — Z79899 Other long term (current) drug therapy: Secondary | ICD-10-CM | POA: Diagnosis not present

## 2020-07-25 DIAGNOSIS — M1990 Unspecified osteoarthritis, unspecified site: Secondary | ICD-10-CM | POA: Diagnosis not present

## 2020-07-25 DIAGNOSIS — D509 Iron deficiency anemia, unspecified: Secondary | ICD-10-CM | POA: Diagnosis not present

## 2020-07-25 DIAGNOSIS — E669 Obesity, unspecified: Secondary | ICD-10-CM | POA: Diagnosis not present

## 2020-07-25 DIAGNOSIS — G4733 Obstructive sleep apnea (adult) (pediatric): Secondary | ICD-10-CM | POA: Diagnosis not present

## 2020-07-25 DIAGNOSIS — G834 Cauda equina syndrome: Secondary | ICD-10-CM | POA: Diagnosis not present

## 2020-07-25 DIAGNOSIS — N809 Endometriosis, unspecified: Secondary | ICD-10-CM | POA: Diagnosis not present

## 2020-07-25 DIAGNOSIS — M48062 Spinal stenosis, lumbar region with neurogenic claudication: Secondary | ICD-10-CM | POA: Diagnosis not present

## 2020-07-25 DIAGNOSIS — M4726 Other spondylosis with radiculopathy, lumbar region: Secondary | ICD-10-CM | POA: Diagnosis not present

## 2020-07-25 DIAGNOSIS — M5116 Intervertebral disc disorders with radiculopathy, lumbar region: Secondary | ICD-10-CM | POA: Diagnosis not present

## 2020-07-25 DIAGNOSIS — E039 Hypothyroidism, unspecified: Secondary | ICD-10-CM | POA: Diagnosis not present

## 2020-07-25 DIAGNOSIS — Z6834 Body mass index (BMI) 34.0-34.9, adult: Secondary | ICD-10-CM | POA: Diagnosis not present

## 2020-07-25 DIAGNOSIS — F339 Major depressive disorder, recurrent, unspecified: Secondary | ICD-10-CM | POA: Diagnosis not present

## 2020-07-25 DIAGNOSIS — G4700 Insomnia, unspecified: Secondary | ICD-10-CM | POA: Diagnosis not present

## 2020-07-26 ENCOUNTER — Ambulatory Visit (INDEPENDENT_AMBULATORY_CARE_PROVIDER_SITE_OTHER): Payer: PPO | Admitting: Psychology

## 2020-07-26 DIAGNOSIS — F4310 Post-traumatic stress disorder, unspecified: Secondary | ICD-10-CM

## 2020-07-31 DIAGNOSIS — M25552 Pain in left hip: Secondary | ICD-10-CM | POA: Diagnosis not present

## 2020-08-01 DIAGNOSIS — Z85828 Personal history of other malignant neoplasm of skin: Secondary | ICD-10-CM | POA: Diagnosis not present

## 2020-08-01 DIAGNOSIS — D2261 Melanocytic nevi of right upper limb, including shoulder: Secondary | ICD-10-CM | POA: Diagnosis not present

## 2020-08-01 DIAGNOSIS — D485 Neoplasm of uncertain behavior of skin: Secondary | ICD-10-CM | POA: Diagnosis not present

## 2020-08-01 DIAGNOSIS — L814 Other melanin hyperpigmentation: Secondary | ICD-10-CM | POA: Diagnosis not present

## 2020-08-01 DIAGNOSIS — D2262 Melanocytic nevi of left upper limb, including shoulder: Secondary | ICD-10-CM | POA: Diagnosis not present

## 2020-08-01 DIAGNOSIS — D2272 Melanocytic nevi of left lower limb, including hip: Secondary | ICD-10-CM | POA: Diagnosis not present

## 2020-08-01 DIAGNOSIS — D2271 Melanocytic nevi of right lower limb, including hip: Secondary | ICD-10-CM | POA: Diagnosis not present

## 2020-08-01 DIAGNOSIS — D225 Melanocytic nevi of trunk: Secondary | ICD-10-CM | POA: Diagnosis not present

## 2020-08-02 ENCOUNTER — Ambulatory Visit (INDEPENDENT_AMBULATORY_CARE_PROVIDER_SITE_OTHER): Payer: PPO | Admitting: Psychology

## 2020-08-02 DIAGNOSIS — F4310 Post-traumatic stress disorder, unspecified: Secondary | ICD-10-CM

## 2020-08-09 ENCOUNTER — Ambulatory Visit (INDEPENDENT_AMBULATORY_CARE_PROVIDER_SITE_OTHER): Payer: PPO | Admitting: Psychology

## 2020-08-09 DIAGNOSIS — F4310 Post-traumatic stress disorder, unspecified: Secondary | ICD-10-CM | POA: Diagnosis not present

## 2020-08-14 DIAGNOSIS — Z79899 Other long term (current) drug therapy: Secondary | ICD-10-CM | POA: Diagnosis not present

## 2020-08-14 DIAGNOSIS — E785 Hyperlipidemia, unspecified: Secondary | ICD-10-CM | POA: Diagnosis not present

## 2020-08-14 DIAGNOSIS — Z6835 Body mass index (BMI) 35.0-35.9, adult: Secondary | ICD-10-CM | POA: Diagnosis not present

## 2020-08-14 DIAGNOSIS — Z1331 Encounter for screening for depression: Secondary | ICD-10-CM | POA: Diagnosis not present

## 2020-08-14 DIAGNOSIS — R7309 Other abnormal glucose: Secondary | ICD-10-CM | POA: Diagnosis not present

## 2020-08-14 DIAGNOSIS — Z20822 Contact with and (suspected) exposure to covid-19: Secondary | ICD-10-CM | POA: Diagnosis not present

## 2020-08-14 DIAGNOSIS — E039 Hypothyroidism, unspecified: Secondary | ICD-10-CM | POA: Diagnosis not present

## 2020-08-14 DIAGNOSIS — D509 Iron deficiency anemia, unspecified: Secondary | ICD-10-CM | POA: Diagnosis not present

## 2020-08-14 DIAGNOSIS — R0981 Nasal congestion: Secondary | ICD-10-CM | POA: Diagnosis not present

## 2020-08-14 DIAGNOSIS — Z0000 Encounter for general adult medical examination without abnormal findings: Secondary | ICD-10-CM | POA: Diagnosis not present

## 2020-08-14 DIAGNOSIS — Z1339 Encounter for screening examination for other mental health and behavioral disorders: Secondary | ICD-10-CM | POA: Diagnosis not present

## 2020-08-14 DIAGNOSIS — M479 Spondylosis, unspecified: Secondary | ICD-10-CM | POA: Diagnosis not present

## 2020-08-14 DIAGNOSIS — G629 Polyneuropathy, unspecified: Secondary | ICD-10-CM | POA: Diagnosis not present

## 2020-08-16 ENCOUNTER — Ambulatory Visit (INDEPENDENT_AMBULATORY_CARE_PROVIDER_SITE_OTHER): Payer: PPO | Admitting: Psychology

## 2020-08-16 DIAGNOSIS — F4310 Post-traumatic stress disorder, unspecified: Secondary | ICD-10-CM

## 2020-08-30 ENCOUNTER — Ambulatory Visit (INDEPENDENT_AMBULATORY_CARE_PROVIDER_SITE_OTHER): Payer: PPO | Admitting: Psychology

## 2020-08-30 DIAGNOSIS — M25552 Pain in left hip: Secondary | ICD-10-CM | POA: Diagnosis not present

## 2020-08-30 DIAGNOSIS — F4310 Post-traumatic stress disorder, unspecified: Secondary | ICD-10-CM

## 2020-09-05 ENCOUNTER — Other Ambulatory Visit: Payer: Self-pay | Admitting: Neurology

## 2020-09-05 DIAGNOSIS — M545 Low back pain, unspecified: Secondary | ICD-10-CM | POA: Diagnosis not present

## 2020-09-05 DIAGNOSIS — M6281 Muscle weakness (generalized): Secondary | ICD-10-CM | POA: Diagnosis not present

## 2020-09-05 DIAGNOSIS — M25552 Pain in left hip: Secondary | ICD-10-CM | POA: Diagnosis not present

## 2020-09-06 ENCOUNTER — Ambulatory Visit (INDEPENDENT_AMBULATORY_CARE_PROVIDER_SITE_OTHER): Payer: PPO | Admitting: Psychology

## 2020-09-06 DIAGNOSIS — F4310 Post-traumatic stress disorder, unspecified: Secondary | ICD-10-CM

## 2020-09-07 ENCOUNTER — Other Ambulatory Visit: Payer: Self-pay

## 2020-09-07 ENCOUNTER — Ambulatory Visit: Payer: PPO | Admitting: Neurology

## 2020-09-07 ENCOUNTER — Encounter: Payer: Self-pay | Admitting: Neurology

## 2020-09-07 VITALS — BP 112/79 | HR 78 | Ht 69.0 in | Wt 233.4 lb

## 2020-09-07 DIAGNOSIS — M6281 Muscle weakness (generalized): Secondary | ICD-10-CM | POA: Diagnosis not present

## 2020-09-07 DIAGNOSIS — M25552 Pain in left hip: Secondary | ICD-10-CM | POA: Diagnosis not present

## 2020-09-07 DIAGNOSIS — G43009 Migraine without aura, not intractable, without status migrainosus: Secondary | ICD-10-CM | POA: Diagnosis not present

## 2020-09-07 DIAGNOSIS — G834 Cauda equina syndrome: Secondary | ICD-10-CM

## 2020-09-07 DIAGNOSIS — M545 Low back pain, unspecified: Secondary | ICD-10-CM | POA: Diagnosis not present

## 2020-09-07 MED ORDER — AIMOVIG 70 MG/ML ~~LOC~~ SOAJ: 70.0000 mg | SUBCUTANEOUS | 11 refills | Status: DC

## 2020-09-07 MED ORDER — UBRELVY 100 MG PO TABS: 1.0000 | ORAL_TABLET | Freq: Every day | ORAL | 11 refills | Status: DC | PRN

## 2020-09-07 NOTE — Progress Notes (Signed)
NEUROLOGY FOLLOW UP OFFICE NOTE  MARSELA [name] [id number]  HISTORY OF PRESENT ILLNESS: Paige [name] is a 54 year old right-handed female with hypothyroidism, restless leg syndrome and depression/PTSD who follows up for migraines and neuropathy.  UPDATE: Last seen in September 2020.    Migraine: In the interim, Botox was switched to [place].  It works well up until 5 days before the next injection Intensity:  severe Duration:  30-60 minutes Frequency:  1 to 2 days a month (last week prior to injection) Frequency of abortive medication: 1 to 2 times a month  Chronic Pain/New lumbar spinal stenosis with Cauda Equina Syndrome: In the interim, she had inserted a spina stimulator in the right hip in December 2020.  She subsequently developed cauda equina syndrome with severe pain radiating down both legs with new bilateral lower extremity tingling, saddle anesthesia and bowel and bladder dysfunction.  CT of lumbar spine showed severe spinal stenosis at L4-5 with bulging disc and progressed anteriolithesis, moderate to severe subarticular stenosis bilaterally, and levoscoliosis at T12-L1.  MRI unable to be performed due to spinal stimulator, so CT myelogram was performed, showing severe spinal stenosis at L4-5 with effacement of root bilaterally and severe compression of the canal.  She underwent L4-S1 laminectomy TLIF and PSF in July 2021.  She has improved bowel and bladder function.    Current NSAIDS:None Current analgesics:None Current triptans:None Current ergotamine:None Current anti-emetic:Zofran ODT 4 mg Current muscle relaxants:Baclofen 10 mg Current anti-anxiolytic:Vanessa Pam 1 mg Current sleep aide:Trazodone 100 mg Current Antihypertensive medications:None Current Antidepressant medications:Cymbalta 60 mg, Wellbutrin XR 150 mg Current Anticonvulsant medications:Gabapentin 800mg  twice daily Current anti-CGRP:Aimovig 70mg , Ubrelvy 100mg  Current  Vitamins/Herbal/Supplements:B complex, C, co-Q10 Current Antihistamines/Decongestants:None Other therapy:Botox Other medication:  Requip   HISTORY: Migraine: Onset:  Since teenager. She has migraines as well as daily headache which have gotten worse over the past 2 years. Location: Migraines: bi-frontal/retro-orbital/temples; Daily headache: Bi-frontal/temporal Quality: Migraine: pounding; Daily headache: non-throbbing Initial intensity: Migraine: 10/10; Daily headache: 6/10 Aura: no Prodrome: no Associated symptoms: Migraine: nausea, photophobia, phonophobia, osmophobia, sometimes vomiting; Daily headache: sometimes photophobia Initial Duration: Migraine: Several hours; Daily headache: constant Initial Frequency: Migraine: every other week; Daily headache: constant Triggers:  Certain scents, bright light, pork Relieving factors:  Rest Activity: Cannot function with migraine  Past NSAIDS: Ibuprofen, naproxen Past analgesics: Percocet (hives), morphine (itching) Past abortive triptans: sumatriptan (difficulty breathing), rizatriptan Past muscle relaxants: cyclobenzaprine (hallucinations) Past anti-emetic: phenergan Past antihypertensive medications: no Past antidepressant medications: Amitriptyline, possibly venlafaxine Past anticonvulsant medications: Depakote, topiramate 100mg  twice daily(side effect), zonisamide 50mg , lamotrigine Past vitamins/Herbal/Supplements: no Other past therapy: acupuncture  Family history of headache: Paternal aunt  Chronic Pain: Since early 2020, she reports numbness and tingling in the toes.  TSH from 05/26/19 was 1.72.  NCV-EMG from 07/15/19 was normal.  Labs from June 2020 demonstrated TSH 1.72.  Labs from October 2020 demonstrated ANA negative; sed rate 19; SPEP/IFE negative for M-spike; B12 401; B6 6.2.  Punch Skin Biopsy performed in November 2020 was negative for small fiber neuropathy but demonstrated some  degenerative changes within epidermal nerves which are non-specific and may be normal but also may be predictive of future development of clinical neuropathy.  History of Subjective Memory Deficits: She has noted short term memory problemssince around 2017. She frequently misplaces items around the house. She does get distracted and will not get around to chores around the house. She has been late paying bills on three occasions. One time, she got disoriented driving  on a familiar route. She sometimes has trouble recalling names of familiar people. She also has word-finding difficulty and problems with spelling. She saw a speech pathologist who told her she has memory deficits.  Her thyroid panel was within normal range.  B12 level from 04/07/2018 was 560.  She underwent neuropsychological testing on 09/16/2018 which demonstrated deficits in verbal fluency, working memory, and mental flexibility as well as mildly below expectation performances in processing speed, learning and memory for information that is not repeated, and verbal abstract reasoning. However, visual-spatial skills, basic attention, nonverbal abstract reasoning, confrontation naming, and memory for information that is repeated were well within normal limits. There was no evidence of cortical dysfunction or early onset Alzheimer's disease. Her cognitive difficulties were suspected to be related to her severe depression, high level of pain, and insomnia. Her uncle and aunt on her mother's side had dementia.  PAST MEDICAL HISTORY: Past Medical History:  Diagnosis Date  . Allergic rhinitis   . Anemia   . Anxiety disorder   . Chronic fatigue   . Chronic headaches   . Chronic pain   . Depression   . Endometriosis   . Family history of colon cancer    mother,uncles,aunts  . Fibromyalgia   . Gallstones   . GERD (gastroesophageal reflux disease)   . Hypothyroidism   . IBS (irritable bowel syndrome)   . Insomnia   . Iron  deficiency   . Laryngopharyngeal reflux (LPR)   . Obesity   . Personal history of colonic polyps 04/2006   polypoid  . PONV (postoperative nausea and vomiting)   . RLS (restless legs syndrome)     MEDICATIONS: Current Outpatient Medications on File Prior to Visit  Medication Sig Dispense Refill  . buPROPion (WELLBUTRIN XL) 150 MG 24 hr tablet Take 1 tablet (150 mg total) by mouth daily. 30 tablet 5  . cariprazine (VRAYLAR) capsule Take 1 capsule (1.5 mg total) by mouth daily. 28 capsule 0  . dicyclomine (BENTYL) 10 MG capsule Take 1 capsule (10 mg total) by mouth 3 (three) times daily as needed for spasms. 90 capsule 11  . DULoxetine (CYMBALTA) 60 MG capsule Take one capsule twice daily. 60 capsule 5  . Erenumab-aooe (AIMOVIG) 70 MG/ML SOAJ Inject 70 mg into the skin every 28 (twenty-eight) days. 1 pen 11  . Fe Fum-Fe Poly-Vit C-Lactobac (FUSION) 65-65-25-30 MG CAPS Take by mouth.    . Ferrous Sulfate Dried (SLOW RELEASE IRON) 45 MG TBCR Take 1 tablet by mouth daily.    Marland Kitchen ipratropium (ATROVENT) 0.03 % nasal spray Place 2 sprays into both nostrils as needed.  30 mL 12  . ipratropium (ATROVENT) 0.06 % nasal spray INSTILL 2 SPRAYS INTO EACH NOSTRIL AS NEEDED TWICE A DAY    . ketoconazole (NIZORAL) 2 % shampoo Apply topically.    . lamoTRIgine (LAMICTAL) 25 MG tablet Take by mouth.    . levothyroxine (SYNTHROID, LEVOTHROID) 88 MCG tablet Take 88 mcg by mouth daily before breakfast.    . methocarbamol (ROBAXIN) 500 MG tablet Take 500 mg by mouth 3 (three) times daily.    Marland Kitchen nystatin cream (MYCOSTATIN) Apply 1 application topically daily as needed for dry skin.    Marland Kitchen omeprazole (PRILOSEC) 20 MG capsule     . ondansetron (ZOFRAN-ODT) 4 MG disintegrating tablet Take 4 mg by mouth every 8 (eight) hours as needed for nausea or vomiting.    . Oxycodone HCl 10 MG TABS Take 10 mg by mouth every 6 (six)  hours as needed.    Vladimir Faster Glycol-Propyl Glycol (SYSTANE) 0.4-0.3 % SOLN Apply 1 drop to eye as  needed.    . polyethylene glycol (MIRALAX / GLYCOLAX) 17 g packet Take 17 g by mouth 2 (two) times daily.    . potassium chloride (K-DUR,KLOR-CON) 10 MEQ tablet Take 1 tablet by mouth daily.    . potassium chloride (KLOR-CON) 10 MEQ tablet Take by mouth.    . promethazine (PHENERGAN) 12.5 MG tablet Take 1 tablet (12.5 mg total) by mouth every 8 (eight) hours as needed for nausea or vomiting. 30 tablet 0  . rizatriptan (MAXALT) 10 MG tablet rizatriptan 10 mg tablet  TAKE 1 TABLET AT EARLIEST ONSET OF MIGRAINE. MAY REPEAT ONCE IN 2 HOURS IF NEEDED    . rOPINIRole (REQUIP) 2 MG tablet Take 2 mg by mouth 2 (two) times daily.    Marland Kitchen terconazole (TERAZOL 3) 0.8 % vaginal cream Place 1 applicator vaginally at bedtime.    Marland Kitchen tiZANidine (ZANAFLEX) 2 MG tablet Take 2 mg by mouth 3 (three) times daily.    Marland Kitchen torsemide (DEMADEX) 20 MG tablet Take 20 mg by mouth daily.    . traZODone (DESYREL) 100 MG tablet Take 2 tablets (200 mg total) by mouth at bedtime. 60 tablet 5  . Ubrogepant (UBRELVY) 100 MG TABS Take 1 tablet by mouth daily as needed (at onset of headaches may repeat dose after 2 hours as needed max 200 mg in 24 hours). 16 tablet 3   Current Facility-Administered Medications on File Prior to Visit  Medication Dose Route Frequency Provider Last Rate Last Admin  . 0.9 %  sodium chloride infusion  500 mL Intravenous Once Ladene Artist, MD        ALLERGIES: Allergies  Allergen Reactions  . Oxycodone-Acetaminophen Hives  . Topiramate Shortness Of Breath and Other (See Comments)    Really wierd feeling Off balance; cloudy thinking Really wierd feeling  . Cyclobenzaprine Other (See Comments)    Other reaction(s): Unknown Hallucinations Unknown   . Meloxicam Other (See Comments)  . Morphine Itching    Other reaction(s): Unknown  ; has tolerated Diluadid per MAR  . Naltrexone Other (See Comments)    SEVERE PAIN ALL OVER  . Pork (Porcine) Protein Other (See Comments)    Other reaction(s):  Other (See Comments) Migraines Other reaction(s): Other (See Comments) Migraines  . Sumatriptan Other (See Comments)    Difficulty breathing Other reaction(s): Other (see comments) Off balance   . Tape Dermatitis  . Flexeril [Cyclobenzaprine Hcl]   . Imitrex [Sumatriptan Base]   . Morphine And Related   . Oxycodone Hcl   . Percocet [Oxycodone-Acetaminophen]     FAMILY HISTORY: Family History  Problem Relation Age of Onset  . Colon cancer Mother        uncles,aunts  . Diabetes Father        paternal grandmother,sister  . Leukemia Father   . Lymphoma Father   . Skin cancer Father   . Heart disease Father   . Diabetes Sister   . Stomach cancer Neg Hx   . Esophageal cancer Neg Hx   . Rectal cancer Neg Hx     SOCIAL HISTORY: Social History   Socioeconomic History  . Marital status: Single    Spouse name: Not on file  . Number of children: 0  . Years of education: Not on file  . Highest education level: Not on file  Occupational History  . Occupation: RN/disabled  Tobacco  Use  . Smoking status: Never Smoker  . Smokeless tobacco: Never Used  Vaping Use  . Vaping Use: Never used  Substance and Sexual Activity  . Alcohol use: Not Currently    Alcohol/week: 0.0 standard drinks    Comment: rarely  . Drug use: No  . Sexual activity: Not on file  Other Topics Concern  . Not on file  Social History Narrative   Patient is left-handed. She lives alone in a one level home. She has 3 cats. She is unable to exercise. Caffeine one soda / day. On disability now. 2 bachelor degrees   Social Determinants of Health   Financial Resource Strain:   . Difficulty of Paying Living Expenses: Not on file  Food Insecurity:   . Worried About Charity fundraiser in the Last Year: Not on file  . Ran Out of Food in the Last Year: Not on file  Transportation Needs:   . Lack of Transportation (Medical): Not on file  . Lack of Transportation (Non-Medical): Not on file  Physical  Activity:   . Days of Exercise per Week: Not on file  . Minutes of Exercise per Session: Not on file  Stress:   . Feeling of Stress : Not on file  Social Connections:   . Frequency of Communication with Friends and Family: Not on file  . Frequency of Social Gatherings with Friends and Family: Not on file  . Attends Religious Services: Not on file  . Active Member of Clubs or Organizations: Not on file  . Attends Archivist Meetings: Not on file  . Marital Status: Not on file  Intimate Partner Violence:   . Fear of Current or Ex-Partner: Not on file  . Emotionally Abused: Not on file  . Physically Abused: Not on file  . Sexually Abused: Not on file   PHYSICAL EXAM: Blood pressure 112/79, pulse 78, height 5\' 9"  (1.753 m), weight 233 lb 6.4 oz (105.9 kg), SpO2 97 %. General: No acute distress.  Patient appears well-groomed.   Head:  Normocephalic/atraumatic Eyes:  Fundi examined but not visualized Neck: supple, no paraspinal tenderness, full range of motion Heart:  Regular rate and rhythm Lungs:  Clear to auscultation bilaterally Back: No paraspinal tenderness Neurological Exam: alert and oriented to person, place, and time. Attention span and concentration intact, recent and remote memory intact, fund of knowledge intact.  Speech fluent and not dysarthric, language intact.  CN II-XII intact. Bulk and tone normal, muscle strength 5/5 throughout.  Sensation to light touch, temperature and vibration intact.  Deep tendon reflexes 2+ throughout, toes downgoing.  Finger to nose and heel to shin testing intact.  Gait normal, Romberg negative.  IMPRESSION: 1.  Migraine without aura, without status migrainosus, not intractable 2.  Chronic pain syndrome 3.  History of cauda equina syndrome secondary to lumbar spinal stenosis at L4-5 s/p laminectomy and fusion  PLAN: 1.  Aimovig 70mg  monthly 2.  Roselyn Meier 100mg  for rescue 3.  Limit use of pain relievers to no more than 2 days out of  week to prevent risk of rebound or medication-overuse headache. 4.  Headache diary 5.  Follow up in one year.  Metta Clines, DO  CC: Ernestene Kiel, MD

## 2020-09-07 NOTE — Patient Instructions (Signed)
Refilled Aimovig and Roselyn Meier

## 2020-09-11 ENCOUNTER — Telehealth (INDEPENDENT_AMBULATORY_CARE_PROVIDER_SITE_OTHER): Payer: PPO | Admitting: Adult Health

## 2020-09-11 DIAGNOSIS — F39 Unspecified mood [affective] disorder: Secondary | ICD-10-CM

## 2020-09-11 DIAGNOSIS — F411 Generalized anxiety disorder: Secondary | ICD-10-CM

## 2020-09-11 DIAGNOSIS — G4700 Insomnia, unspecified: Secondary | ICD-10-CM | POA: Diagnosis not present

## 2020-09-11 DIAGNOSIS — F331 Major depressive disorder, recurrent, moderate: Secondary | ICD-10-CM | POA: Diagnosis not present

## 2020-09-11 DIAGNOSIS — F4310 Post-traumatic stress disorder, unspecified: Secondary | ICD-10-CM

## 2020-09-11 NOTE — Progress Notes (Signed)
Paige [name] [id number] July 17, 1966 54 y.o.  Virtual Visit via Telephone Note  I connected with pt on 09/11/20 at 10:00 AM EDT by telephone and verified that I am speaking with the correct person using two identifiers.   I discussed the limitations, risks, security and privacy concerns of performing an evaluation and management service by telephone and the availability of in person appointments. I also discussed with the patient that there may be a patient responsible charge related to this service. The patient expressed understanding and agreed to proceed.   I discussed the assessment and treatment plan with the patient. The patient was provided an opportunity to ask questions and all were answered. The patient agreed with the plan and demonstrated an understanding of the instructions.   The patient was advised to call back or seek an in-person evaluation if the symptoms worsen or if the condition fails to improve as anticipated.  I provided 30  minutes of non-face-to-face time during this encounter.  The patient was located at home.  The provider was located at [place].   Aloha Gell, NP   Subjective:   Patient ID:  Paige [name] is a 54 y.o. (DOB 24-Jan-1966) female.  Chief Complaint: No chief complaint on file.   HPI [name] presents for follow-up of GAD, MDD, PTSD, insomnia.  Describes mood today as "ok". Pleasant. Mood symptoms - reports decreased depression, anxiety, and irritability. Stating "I'm doing pretty good right now". Has started a new diet. Recovered from surgery. Working with P/T. Stable interest and motivation. Continues to see therapist Chiropractor) every week for PTSD. Taking medications as prescribed.  Energy levels improved. Active, walking daily. Enjoys some usual interests and activities. Single. Dating. Lives alone with 3 cats - and one outside. Mother local and supportive. Appetite adequate. Weight loss - 12 pounds  - 230 pounds. Started [employer].  Sleeps well most nights. Averages 6 hours. Denies daytime napping.  Focus and concentration "ok, it could improve". Making lists to help get things. Completing some household tasks. Manages household tasks. Disabled since 2009. Denies SI or HI. Denies AH or VH.  Previous medication trials: Lexapro, Paxil, Trazadone, Wellbutrin SR, Geodon, Seroquel, Risperdal, Depakote, Topamax, Lithium, Provigil, Ambien, Cytomel, Clonazepam and other benzodixepaines, Elavil, Effexor, remeron, Prozac, Anafranil, Tofranil, Zoloft, Geodon, Buspar, Adderall, Ritalin, Abilify, Rexulti, Lamictal, Latuda  Review of Systems:  Review of Systems  Musculoskeletal: Negative for gait problem.  Neurological: Negative for tremors.  Psychiatric/Behavioral:       Please refer to HPI    Medications: I have reviewed the patient's current medications.  Current Outpatient Medications  Medication Sig Dispense Refill  . buPROPion (WELLBUTRIN XL) 150 MG 24 hr tablet Take 1 tablet (150 mg total) by mouth daily. 30 tablet 5  . cariprazine (VRAYLAR) capsule Take 1 capsule (1.5 mg total) by mouth daily. 28 capsule 0  . dicyclomine (BENTYL) 10 MG capsule Take 1 capsule (10 mg total) by mouth 3 (three) times daily as needed for spasms. 90 capsule 11  . DULoxetine (CYMBALTA) 60 MG capsule Take one capsule twice daily. 60 capsule 5  . Erenumab-aooe (AIMOVIG) 70 MG/ML SOAJ Inject 70 mg into the skin every 28 (twenty-eight) days. 1.12 mL 11  . Fe Fum-Fe Poly-Vit C-Lactobac (FUSION) 65-65-25-30 MG CAPS Take by mouth.    . Ferrous Sulfate Dried (SLOW RELEASE IRON) 45 MG TBCR Take 1 tablet by mouth daily.    Marland Kitchen ipratropium (ATROVENT) 0.03 % nasal spray Place 2 sprays  into both nostrils as needed.  30 mL 12  . ipratropium (ATROVENT) 0.06 % nasal spray INSTILL 2 SPRAYS INTO EACH NOSTRIL AS NEEDED TWICE A DAY    . ketoconazole (NIZORAL) 2 % shampoo Apply topically.    . lamoTRIgine (LAMICTAL) 25 MG tablet  Take by mouth.    . levothyroxine (SYNTHROID, LEVOTHROID) 88 MCG tablet Take 88 mcg by mouth daily before breakfast.    . methocarbamol (ROBAXIN) 500 MG tablet Take 500 mg by mouth 3 (three) times daily. (Patient not taking: Reported on 09/07/2020)    . nystatin cream (MYCOSTATIN) Apply 1 application topically daily as needed for dry skin.    Marland Kitchen omeprazole (PRILOSEC) 20 MG capsule     . ondansetron (ZOFRAN-ODT) 4 MG disintegrating tablet Take 4 mg by mouth every 8 (eight) hours as needed for nausea or vomiting.    . Oxycodone HCl 10 MG TABS Take 10 mg by mouth every 6 (six) hours as needed.    Vladimir Faster Glycol-Propyl Glycol (SYSTANE) 0.4-0.3 % SOLN Apply 1 drop to eye as needed.    . polyethylene glycol (MIRALAX / GLYCOLAX) 17 g packet Take 17 g by mouth 2 (two) times daily.    . potassium chloride (K-DUR,KLOR-CON) 10 MEQ tablet Take 1 tablet by mouth daily. (Patient not taking: Reported on 09/07/2020)    . potassium chloride (KLOR-CON) 10 MEQ tablet Take by mouth.    . promethazine (PHENERGAN) 12.5 MG tablet Take 1 tablet (12.5 mg total) by mouth every 8 (eight) hours as needed for nausea or vomiting. 30 tablet 0  . rizatriptan (MAXALT) 10 MG tablet rizatriptan 10 mg tablet  TAKE 1 TABLET AT EARLIEST ONSET OF MIGRAINE. MAY REPEAT ONCE IN 2 HOURS IF NEEDED (Patient not taking: Reported on 09/07/2020)    . rOPINIRole (REQUIP) 2 MG tablet Take 2 mg by mouth 2 (two) times daily.    Marland Kitchen terconazole (TERAZOL 3) 0.8 % vaginal cream Place 1 applicator vaginally at bedtime.    Marland Kitchen tiZANidine (ZANAFLEX) 2 MG tablet Take 2 mg by mouth 3 (three) times daily. (Patient not taking: Reported on 09/07/2020)    . torsemide (DEMADEX) 20 MG tablet Take 20 mg by mouth daily.     . traZODone (DESYREL) 100 MG tablet Take 2 tablets (200 mg total) by mouth at bedtime. 60 tablet 5  . Ubrogepant (UBRELVY) 100 MG TABS Take 1 tablet by mouth daily as needed (at onset of headaches may repeat dose after 2 hours as needed max 200 mg  in 24 hours). 16 tablet 11   Current Facility-Administered Medications  Medication Dose Route Frequency Provider Last Rate Last Admin  . 0.9 %  sodium chloride infusion  500 mL Intravenous Once Ladene Artist, MD        Medication Side Effects: None  Allergies:  Allergies  Allergen Reactions  . Cyclobenzaprine Other (See Comments)    Other reaction(s): Unknown Hallucinations Unknown   . Meloxicam Other (See Comments)  . Naltrexone Other (See Comments)    SEVERE PAIN ALL OVER  . Pork (Porcine) Protein Other (See Comments)    Other reaction(s): Other (See Comments) Migraines Other reaction(s): Other (See Comments) Migraines  . Sumatriptan Other (See Comments)    Difficulty breathing Other reaction(s): Other (see comments) Off balance   . Tape Dermatitis  . Flexeril [Cyclobenzaprine Hcl]   . Imitrex [Sumatriptan Base]   . Other     Past Medical History:  Diagnosis Date  . Allergic rhinitis   .  Anemia   . Anxiety disorder   . Chronic fatigue   . Chronic headaches   . Chronic pain   . Depression   . Endometriosis   . Family history of colon cancer    mother,uncles,aunts  . Fibromyalgia   . Gallstones   . GERD (gastroesophageal reflux disease)   . Hypothyroidism   . IBS (irritable bowel syndrome)   . Insomnia   . Iron deficiency   . Laryngopharyngeal reflux (LPR)   . Obesity   . Personal history of colonic polyps 04/2006   polypoid  . PONV (postoperative nausea and vomiting)   . RLS (restless legs syndrome)     Family History  Problem Relation Age of Onset  . Colon cancer Mother        uncles,aunts  . Diabetes Father        paternal grandmother,sister  . Leukemia Father   . Lymphoma Father   . Skin cancer Father   . Heart disease Father   . Diabetes Sister   . Stomach cancer Neg Hx   . Esophageal cancer Neg Hx   . Rectal cancer Neg Hx     Social History   Socioeconomic History  . Marital status: Single    Spouse name: Not on file  .  Number of children: 0  . Years of education: Not on file  . Highest education level: Not on file  Occupational History  . Occupation: RN/disabled  Tobacco Use  . Smoking status: Never Smoker  . Smokeless tobacco: Never Used  Vaping Use  . Vaping Use: Never used  Substance and Sexual Activity  . Alcohol use: Not Currently    Alcohol/week: 0.0 standard drinks    Comment: rarely  . Drug use: No  . Sexual activity: Not on file  Other Topics Concern  . Not on file  Social History Narrative   Patient is left-handed. She lives alone in a one level home. She has 3 cats. She is unable to exercise. Caffeine one soda / day. On disability now. 2 bachelor degrees   Social Determinants of Health   Financial Resource Strain:   . Difficulty of Paying Living Expenses: Not on file  Food Insecurity:   . Worried About Charity fundraiser in the Last Year: Not on file  . Ran Out of Food in the Last Year: Not on file  Transportation Needs:   . Lack of Transportation (Medical): Not on file  . Lack of Transportation (Non-Medical): Not on file  Physical Activity:   . Days of Exercise per Week: Not on file  . Minutes of Exercise per Session: Not on file  Stress:   . Feeling of Stress : Not on file  Social Connections:   . Frequency of Communication with Friends and Family: Not on file  . Frequency of Social Gatherings with Friends and Family: Not on file  . Attends Religious Services: Not on file  . Active Member of Clubs or Organizations: Not on file  . Attends Archivist Meetings: Not on file  . Marital Status: Not on file  Intimate Partner Violence:   . Fear of Current or Ex-Partner: Not on file  . Emotionally Abused: Not on file  . Physically Abused: Not on file  . Sexually Abused: Not on file    Past Medical History, Surgical history, Social history, and Family history were reviewed and updated as appropriate.   Please see review of systems for further details on the  patient's  review from today.   Objective:   Physical Exam:  There were no vitals taken for this visit.  Physical Exam Constitutional:      General: She is not in acute distress. Musculoskeletal:        General: No deformity.  Neurological:     Mental Status: She is alert and oriented to person, place, and time.     Coordination: Coordination normal.  Psychiatric:        Attention and Perception: Attention and perception normal. She does not perceive auditory or visual hallucinations.        Mood and Affect: Mood normal. Mood is not anxious or depressed. Affect is not labile, blunt, angry or inappropriate.        Speech: Speech normal.        Behavior: Behavior normal.        Thought Content: Thought content normal. Thought content is not paranoid or delusional. Thought content does not include homicidal or suicidal ideation. Thought content does not include homicidal or suicidal plan.        Cognition and Memory: Cognition and memory normal.        Judgment: Judgment normal.     Comments: Insight intact     Lab Review:     Component Value Date/Time   NA 140 05/26/2019 1046   K 3.4 (L) 05/26/2019 1046   CL 95 (L) 05/26/2019 1046   CO2 37 (H) 05/26/2019 1046   GLUCOSE 97 05/26/2019 1046   BUN 17 05/26/2019 1046   CREATININE 1.09 05/26/2019 1046   CALCIUM 9.3 05/26/2019 1046   PROT 6.9 05/26/2019 1046   ALBUMIN 4.2 05/26/2019 1046   AST 36 05/26/2019 1046   ALT 30 05/26/2019 1046   ALKPHOS 98 05/26/2019 1046   BILITOT 0.4 05/26/2019 1046       Component Value Date/Time   WBC 6.9 05/26/2019 1046   RBC 4.42 05/26/2019 1046   HGB 12.6 05/26/2019 1046   HCT 38.7 05/26/2019 1046   PLT 209.0 05/26/2019 1046   MCV 87.6 05/26/2019 1046   MCHC 32.5 05/26/2019 1046   RDW 14.6 05/26/2019 1046   LYMPHSABS 2.0 05/26/2019 1046   MONOABS 0.6 05/26/2019 1046   EOSABS 0.1 05/26/2019 1046   BASOSABS 0.0 05/26/2019 1046    No results found for: POCLITH, LITHIUM   No results  found for: PHENYTOIN, PHENOBARB, VALPROATE, CBMZ   .res Assessment: Plan:    Plan:  1. Wellbutrin SR 150mg  in the am  2. Cymbalta 60mg  BID 3. Trazadone 100mg  - 2 at hs  4. Vraylar 1.5mg  daily - samples given 6 weeks.  Continue therapy with Bambi Cottle.   RTC 3 months  Patient advised to contact office with any questions, adverse effects, or acute worsening in signs and symptoms.  Discussed potential metabolic side effects associated with atypical antipsychotics, as well as potential risk for movement side effects. Advised pt to contact office if movement side effects occur.    Diagnoses and all orders for this visit:  Insomnia, unspecified type  Major depressive disorder, recurrent episode, moderate (HCC)  Generalized anxiety disorder  PTSD (post-traumatic stress disorder)  Episodic mood disorder ([place])    Please see After Visit Summary for patient specific instructions.  Future Appointments  Date Time Provider [place]  09/13/2020  3:00 PM Cottle, Lucious Groves, LCSW [facility] None  09/20/2020  2:00 PM Cottle, Bambi G, LCSW [facility] None  09/27/2020  3:00 PM Cottle, Bambi G, LCSW [facility] None  10/04/2020  2:00 PM Cottle, Bambi G, LCSW [facility] None  10/18/2020  2:00 PM Cottle, Bambi G, LCSW [facility] None  11/01/2020  2:00 PM Cottle, Bambi G, LCSW [facility] None  11/15/2020  2:00 PM Cottle, Bambi G, LCSW [facility] None  11/29/2020  2:00 PM Cottle, Bambi G, LCSW [facility] None  12/13/2020  2:00 PM Cottle, Bambi G, LCSW [facility] None  09/10/2021  9:50 AM [name], Adam R, DO [facility] None    No orders of the defined types were placed in this encounter.     -------------------------------

## 2020-09-12 DIAGNOSIS — M25552 Pain in left hip: Secondary | ICD-10-CM | POA: Diagnosis not present

## 2020-09-12 DIAGNOSIS — M6281 Muscle weakness (generalized): Secondary | ICD-10-CM | POA: Diagnosis not present

## 2020-09-12 DIAGNOSIS — M545 Low back pain, unspecified: Secondary | ICD-10-CM | POA: Diagnosis not present

## 2020-09-13 ENCOUNTER — Ambulatory Visit (INDEPENDENT_AMBULATORY_CARE_PROVIDER_SITE_OTHER): Payer: PPO | Admitting: Psychology

## 2020-09-13 DIAGNOSIS — G894 Chronic pain syndrome: Secondary | ICD-10-CM | POA: Diagnosis not present

## 2020-09-13 DIAGNOSIS — F4310 Post-traumatic stress disorder, unspecified: Secondary | ICD-10-CM

## 2020-09-13 DIAGNOSIS — M4326 Fusion of spine, lumbar region: Secondary | ICD-10-CM | POA: Diagnosis not present

## 2020-09-13 DIAGNOSIS — M797 Fibromyalgia: Secondary | ICD-10-CM | POA: Diagnosis not present

## 2020-09-13 DIAGNOSIS — M5416 Radiculopathy, lumbar region: Secondary | ICD-10-CM | POA: Diagnosis not present

## 2020-09-13 DIAGNOSIS — M461 Sacroiliitis, not elsewhere classified: Secondary | ICD-10-CM | POA: Diagnosis not present

## 2020-09-14 DIAGNOSIS — M545 Low back pain, unspecified: Secondary | ICD-10-CM | POA: Diagnosis not present

## 2020-09-14 DIAGNOSIS — M6281 Muscle weakness (generalized): Secondary | ICD-10-CM | POA: Diagnosis not present

## 2020-09-14 DIAGNOSIS — M25552 Pain in left hip: Secondary | ICD-10-CM | POA: Diagnosis not present

## 2020-09-19 DIAGNOSIS — M545 Low back pain, unspecified: Secondary | ICD-10-CM | POA: Diagnosis not present

## 2020-09-19 DIAGNOSIS — M25552 Pain in left hip: Secondary | ICD-10-CM | POA: Diagnosis not present

## 2020-09-19 DIAGNOSIS — M6281 Muscle weakness (generalized): Secondary | ICD-10-CM | POA: Diagnosis not present

## 2020-09-20 ENCOUNTER — Ambulatory Visit (INDEPENDENT_AMBULATORY_CARE_PROVIDER_SITE_OTHER): Payer: PPO | Admitting: Psychology

## 2020-09-20 DIAGNOSIS — F4310 Post-traumatic stress disorder, unspecified: Secondary | ICD-10-CM | POA: Diagnosis not present

## 2020-09-21 DIAGNOSIS — M6281 Muscle weakness (generalized): Secondary | ICD-10-CM | POA: Diagnosis not present

## 2020-09-21 DIAGNOSIS — M25552 Pain in left hip: Secondary | ICD-10-CM | POA: Diagnosis not present

## 2020-09-21 DIAGNOSIS — M545 Low back pain, unspecified: Secondary | ICD-10-CM | POA: Diagnosis not present

## 2020-09-26 DIAGNOSIS — M6281 Muscle weakness (generalized): Secondary | ICD-10-CM | POA: Diagnosis not present

## 2020-09-26 DIAGNOSIS — M25552 Pain in left hip: Secondary | ICD-10-CM | POA: Diagnosis not present

## 2020-09-26 DIAGNOSIS — Z6833 Body mass index (BMI) 33.0-33.9, adult: Secondary | ICD-10-CM | POA: Diagnosis not present

## 2020-09-26 DIAGNOSIS — K1121 Acute sialoadenitis: Secondary | ICD-10-CM | POA: Diagnosis not present

## 2020-09-26 DIAGNOSIS — M545 Low back pain, unspecified: Secondary | ICD-10-CM | POA: Diagnosis not present

## 2020-09-27 ENCOUNTER — Ambulatory Visit: Payer: PPO | Admitting: Psychology

## 2020-09-28 DIAGNOSIS — M6281 Muscle weakness (generalized): Secondary | ICD-10-CM | POA: Diagnosis not present

## 2020-09-28 DIAGNOSIS — M25552 Pain in left hip: Secondary | ICD-10-CM | POA: Diagnosis not present

## 2020-09-28 DIAGNOSIS — M545 Low back pain, unspecified: Secondary | ICD-10-CM | POA: Diagnosis not present

## 2020-10-03 DIAGNOSIS — M545 Low back pain, unspecified: Secondary | ICD-10-CM | POA: Diagnosis not present

## 2020-10-03 DIAGNOSIS — M6281 Muscle weakness (generalized): Secondary | ICD-10-CM | POA: Diagnosis not present

## 2020-10-03 DIAGNOSIS — M25552 Pain in left hip: Secondary | ICD-10-CM | POA: Diagnosis not present

## 2020-10-04 ENCOUNTER — Ambulatory Visit (INDEPENDENT_AMBULATORY_CARE_PROVIDER_SITE_OTHER): Payer: PPO | Admitting: Psychology

## 2020-10-04 DIAGNOSIS — F4310 Post-traumatic stress disorder, unspecified: Secondary | ICD-10-CM | POA: Diagnosis not present

## 2020-10-10 DIAGNOSIS — M6281 Muscle weakness (generalized): Secondary | ICD-10-CM | POA: Diagnosis not present

## 2020-10-10 DIAGNOSIS — M25552 Pain in left hip: Secondary | ICD-10-CM | POA: Diagnosis not present

## 2020-10-10 DIAGNOSIS — M545 Low back pain, unspecified: Secondary | ICD-10-CM | POA: Diagnosis not present

## 2020-10-11 ENCOUNTER — Ambulatory Visit: Payer: PPO | Admitting: Psychology

## 2020-10-16 ENCOUNTER — Ambulatory Visit (INDEPENDENT_AMBULATORY_CARE_PROVIDER_SITE_OTHER): Payer: PPO | Admitting: Psychology

## 2020-10-16 DIAGNOSIS — F4310 Post-traumatic stress disorder, unspecified: Secondary | ICD-10-CM

## 2020-10-17 DIAGNOSIS — M545 Low back pain, unspecified: Secondary | ICD-10-CM | POA: Diagnosis not present

## 2020-10-17 DIAGNOSIS — M25552 Pain in left hip: Secondary | ICD-10-CM | POA: Diagnosis not present

## 2020-10-17 DIAGNOSIS — M6281 Muscle weakness (generalized): Secondary | ICD-10-CM | POA: Diagnosis not present

## 2020-10-18 ENCOUNTER — Ambulatory Visit: Payer: PPO | Admitting: Psychology

## 2020-10-24 DIAGNOSIS — M545 Low back pain, unspecified: Secondary | ICD-10-CM | POA: Diagnosis not present

## 2020-10-24 DIAGNOSIS — M6281 Muscle weakness (generalized): Secondary | ICD-10-CM | POA: Diagnosis not present

## 2020-10-24 DIAGNOSIS — M25552 Pain in left hip: Secondary | ICD-10-CM | POA: Diagnosis not present

## 2020-10-31 DIAGNOSIS — M6281 Muscle weakness (generalized): Secondary | ICD-10-CM | POA: Diagnosis not present

## 2020-10-31 DIAGNOSIS — M25552 Pain in left hip: Secondary | ICD-10-CM | POA: Diagnosis not present

## 2020-10-31 DIAGNOSIS — M545 Low back pain, unspecified: Secondary | ICD-10-CM | POA: Diagnosis not present

## 2020-11-01 ENCOUNTER — Ambulatory Visit (INDEPENDENT_AMBULATORY_CARE_PROVIDER_SITE_OTHER): Payer: PPO | Admitting: Psychology

## 2020-11-01 DIAGNOSIS — F4310 Post-traumatic stress disorder, unspecified: Secondary | ICD-10-CM | POA: Diagnosis not present

## 2020-11-06 ENCOUNTER — Telehealth: Payer: Self-pay | Admitting: Adult Health

## 2020-11-06 ENCOUNTER — Encounter: Payer: Self-pay | Admitting: Adult Health

## 2020-11-06 ENCOUNTER — Telehealth (INDEPENDENT_AMBULATORY_CARE_PROVIDER_SITE_OTHER): Payer: PPO | Admitting: Adult Health

## 2020-11-06 ENCOUNTER — Other Ambulatory Visit: Payer: Self-pay | Admitting: Adult Health

## 2020-11-06 DIAGNOSIS — F39 Unspecified mood [affective] disorder: Secondary | ICD-10-CM | POA: Diagnosis not present

## 2020-11-06 DIAGNOSIS — F411 Generalized anxiety disorder: Secondary | ICD-10-CM

## 2020-11-06 DIAGNOSIS — F4310 Post-traumatic stress disorder, unspecified: Secondary | ICD-10-CM

## 2020-11-06 DIAGNOSIS — F331 Major depressive disorder, recurrent, moderate: Secondary | ICD-10-CM | POA: Diagnosis not present

## 2020-11-06 DIAGNOSIS — G4700 Insomnia, unspecified: Secondary | ICD-10-CM

## 2020-11-06 MED ORDER — CLONAZEPAM 0.5 MG PO TABS: 0.5000 mg | ORAL_TABLET | Freq: Two times a day (BID) | ORAL | 2 refills | Status: DC | PRN

## 2020-11-06 MED ORDER — CARIPRAZINE HCL 3 MG PO CAPS: 3.0000 mg | ORAL_CAPSULE | Freq: Every day | ORAL | 5 refills | Status: DC

## 2020-11-06 MED ORDER — CLONAZEPAM 0.5 MG PO TBDP: 0.5000 mg | ORAL_TABLET | Freq: Two times a day (BID) | ORAL | 0 refills | Status: DC

## 2020-11-06 NOTE — Telephone Encounter (Signed)
Did you meant to send Rx? It says sample

## 2020-11-06 NOTE — Telephone Encounter (Signed)
Pt called to report Pharmacy has not received Vraylar Rx for 3 mg. Please sent to [facility] in [place]

## 2020-11-06 NOTE — Telephone Encounter (Signed)
Noted thanks °

## 2020-11-06 NOTE — Progress Notes (Signed)
Paige [name] [id number] 1966-02-02 54 y.o.  Virtual Visit via Telephone Note  I connected with pt on 11/06/20 at 10:00 AM EST by telephone and verified that I am speaking with the correct person using two identifiers.   I discussed the limitations, risks, security and privacy concerns of performing an evaluation and management service by telephone and the availability of in person appointments. I also discussed with the patient that there may be a patient responsible charge related to this service. The patient expressed understanding and agreed to proceed.   I discussed the assessment and treatment plan with the patient. The patient was provided an opportunity to ask questions and all were answered. The patient agreed with the plan and demonstrated an understanding of the instructions.   The patient was advised to call back or seek an in-person evaluation if the symptoms worsen or if the condition fails to improve as anticipated.  I provided minutes of non-face-to-face time during this encounter.  The patient was located at home.  The provider was located at [place].   Aloha Gell, NP   Subjective:   Patient ID:  Paige [name] is a 54 y.o. (DOB 02/20/1966) female.  Chief Complaint: No chief complaint on file.   HPI Paige [name] presents for follow-up of GAD, MDD, PTSD, insomnia.  Describes mood today as "ok". Pleasant. Mood symptoms - reports depression, anxiety, and irritability. Stating "I'm having a lot of ups and downs - mostly up". Spending money she doesn't have. Buying things she doesn't need. Mother has intervened and told her to get her spending under control. Has lost more weight -Optivia diet. Finishing up P/T.  Stable interest and motivation. Continues to see therapist Chiropractor) every week for PTSD. Taking medications as prescribed.  Energy levels improved. Active, walking daily. Enjoys some usual interests and activities. Single.  Dating. Lives alone with 3 cats - and one outside. Mother local and supportive. Appetite adequate. Weight loss - 32 - 210 pounds - [employer]. Sleeps well most nights. Averages 4 to 8 hours. Denies daytime napping.  Focus and concentration "not great" Making lists. Completing some household tasks. Manages household tasks. Disabled since 2009. Denies SI or HI.  Denies AH or VH.  Previous medication trials: Lexapro, Paxil, Trazadone, Wellbutrin SR, Geodon, Seroquel, Risperdal, Depakote, Topamax, Lithium, Provigil, Ambien, Cytomel, Clonazepam and other benzodixepaines, Elavil, Effexor, remeron, Prozac, Anafranil, Tofranil, Zoloft, Geodon, Buspar, Adderall, Ritalin, Abilify, Rexulti, Lamictal, Latuda  Review of Systems:  Review of Systems  Musculoskeletal: Negative for gait problem.  Neurological: Negative for tremors.  Psychiatric/Behavioral:       Please refer to HPI    Medications: I have reviewed the patient's current medications.  Current Outpatient Medications  Medication Sig Dispense Refill  . buPROPion (WELLBUTRIN XL) 150 MG 24 hr tablet Take 1 tablet (150 mg total) by mouth daily. 30 tablet 5  . cariprazine (VRAYLAR) capsule Take 1 capsule (3 mg total) by mouth daily. 30 capsule 5  . clonazePAM (KLONOPIN) 0.5 MG tablet Take 1 tablet (0.5 mg total) by mouth 2 (two) times daily as needed for anxiety. 30 tablet 2  . dicyclomine (BENTYL) 10 MG capsule Take 1 capsule (10 mg total) by mouth 3 (three) times daily as needed for spasms. 90 capsule 11  . DULoxetine (CYMBALTA) 60 MG capsule Take one capsule twice daily. 60 capsule 5  . Erenumab-aooe (AIMOVIG) 70 MG/ML SOAJ Inject 70 mg into the skin every 28 (twenty-eight) days. 1.12 mL 11  .  Fe Fum-Fe Poly-Vit C-Lactobac (FUSION) 65-65-25-30 MG CAPS Take by mouth.    . Ferrous Sulfate Dried (SLOW RELEASE IRON) 45 MG TBCR Take 1 tablet by mouth daily.    Marland Kitchen ipratropium (ATROVENT) 0.03 % nasal spray Place 2 sprays into both nostrils as needed.   30 mL 12  . ipratropium (ATROVENT) 0.06 % nasal spray INSTILL 2 SPRAYS INTO EACH NOSTRIL AS NEEDED TWICE A DAY    . ketoconazole (NIZORAL) 2 % shampoo Apply topically.    . lamoTRIgine (LAMICTAL) 25 MG tablet Take by mouth.    . levothyroxine (SYNTHROID, LEVOTHROID) 88 MCG tablet Take 88 mcg by mouth daily before breakfast.    . methocarbamol (ROBAXIN) 500 MG tablet Take 500 mg by mouth 3 (three) times daily. (Patient not taking: Reported on 09/07/2020)    . nystatin cream (MYCOSTATIN) Apply 1 application topically daily as needed for dry skin.    Marland Kitchen omeprazole (PRILOSEC) 20 MG capsule     . ondansetron (ZOFRAN-ODT) 4 MG disintegrating tablet Take 4 mg by mouth every 8 (eight) hours as needed for nausea or vomiting.    . Oxycodone HCl 10 MG TABS Take 10 mg by mouth every 6 (six) hours as needed.    Vladimir Faster Glycol-Propyl Glycol (SYSTANE) 0.4-0.3 % SOLN Apply 1 drop to eye as needed.    . polyethylene glycol (MIRALAX / GLYCOLAX) 17 g packet Take 17 g by mouth 2 (two) times daily.    . potassium chloride (K-DUR,KLOR-CON) 10 MEQ tablet Take 1 tablet by mouth daily. (Patient not taking: Reported on 09/07/2020)    . potassium chloride (KLOR-CON) 10 MEQ tablet Take by mouth.    . promethazine (PHENERGAN) 12.5 MG tablet Take 1 tablet (12.5 mg total) by mouth every 8 (eight) hours as needed for nausea or vomiting. 30 tablet 0  . rizatriptan (MAXALT) 10 MG tablet rizatriptan 10 mg tablet  TAKE 1 TABLET AT EARLIEST ONSET OF MIGRAINE. MAY REPEAT ONCE IN 2 HOURS IF NEEDED (Patient not taking: Reported on 09/07/2020)    . rOPINIRole (REQUIP) 2 MG tablet Take 2 mg by mouth 2 (two) times daily.    Marland Kitchen terconazole (TERAZOL 3) 0.8 % vaginal cream Place 1 applicator vaginally at bedtime.    Marland Kitchen tiZANidine (ZANAFLEX) 2 MG tablet Take 2 mg by mouth 3 (three) times daily. (Patient not taking: Reported on 09/07/2020)    . torsemide (DEMADEX) 20 MG tablet Take 20 mg by mouth daily.     . traZODone (DESYREL) 100 MG tablet  Take 2 tablets (200 mg total) by mouth at bedtime. 60 tablet 5  . Ubrogepant (UBRELVY) 100 MG TABS Take 1 tablet by mouth daily as needed (at onset of headaches may repeat dose after 2 hours as needed max 200 mg in 24 hours). 16 tablet 11   Current Facility-Administered Medications  Medication Dose Route Frequency Provider Last Rate Last Admin  . 0.9 %  sodium chloride infusion  500 mL Intravenous Once Ladene Artist, MD        Medication Side Effects: None  Allergies:  Allergies  Allergen Reactions  . Cyclobenzaprine Other (See Comments)    Other reaction(s): Unknown Hallucinations Unknown   . Meloxicam Other (See Comments)  . Naltrexone Other (See Comments)    SEVERE PAIN ALL OVER  . Pork (Porcine) Protein Other (See Comments)    Other reaction(s): Other (See Comments) Migraines Other reaction(s): Other (See Comments) Migraines  . Sumatriptan Other (See Comments)    Difficulty breathing Other  reaction(s): Other (see comments) Off balance   . Tape Dermatitis  . Flexeril [Cyclobenzaprine Hcl]   . Imitrex [Sumatriptan Base]   . Other   . Sulfa Antibiotics     Past Medical History:  Diagnosis Date  . Allergic rhinitis   . Anemia   . Anxiety disorder   . Chronic fatigue   . Chronic headaches   . Chronic pain   . Depression   . Endometriosis   . Family history of colon cancer    mother,uncles,aunts  . Fibromyalgia   . Gallstones   . GERD (gastroesophageal reflux disease)   . Hypothyroidism   . IBS (irritable bowel syndrome)   . Insomnia   . Iron deficiency   . Laryngopharyngeal reflux (LPR)   . Obesity   . Personal history of colonic polyps 04/2006   polypoid  . PONV (postoperative nausea and vomiting)   . RLS (restless legs syndrome)     Family History  Problem Relation Age of Onset  . Colon cancer Mother        uncles,aunts  . Diabetes Father        paternal grandmother,sister  . Leukemia Father   . Lymphoma Father   . Skin cancer Father   .  Heart disease Father   . Diabetes Sister   . Stomach cancer Neg Hx   . Esophageal cancer Neg Hx   . Rectal cancer Neg Hx     Social History   Socioeconomic History  . Marital status: Single    Spouse name: Not on file  . Number of children: 0  . Years of education: Not on file  . Highest education level: Not on file  Occupational History  . Occupation: RN/disabled  Tobacco Use  . Smoking status: Never Smoker  . Smokeless tobacco: Never Used  Vaping Use  . Vaping Use: Never used  Substance and Sexual Activity  . Alcohol use: Not Currently    Alcohol/week: 0.0 standard drinks    Comment: rarely  . Drug use: No  . Sexual activity: Not on file  Other Topics Concern  . Not on file  Social History Narrative   Patient is left-handed. She lives alone in a one level home. She has 3 cats. She is unable to exercise. Caffeine one soda / day. On disability now. 2 bachelor degrees   Social Determinants of Health   Financial Resource Strain:   . Difficulty of Paying Living Expenses: Not on file  Food Insecurity:   . Worried About Charity fundraiser in the Last Year: Not on file  . Ran Out of Food in the Last Year: Not on file  Transportation Needs:   . Lack of Transportation (Medical): Not on file  . Lack of Transportation (Non-Medical): Not on file  Physical Activity:   . Days of Exercise per Week: Not on file  . Minutes of Exercise per Session: Not on file  Stress:   . Feeling of Stress : Not on file  Social Connections:   . Frequency of Communication with Friends and Family: Not on file  . Frequency of Social Gatherings with Friends and Family: Not on file  . Attends Religious Services: Not on file  . Active Member of Clubs or Organizations: Not on file  . Attends Archivist Meetings: Not on file  . Marital Status: Not on file  Intimate Partner Violence:   . Fear of Current or Ex-Partner: Not on file  . Emotionally Abused: Not on  file  . Physically Abused: Not  on file  . Sexually Abused: Not on file    Past Medical History, Surgical history, Social history, and Family history were reviewed and updated as appropriate.   Please see review of systems for further details on the patient's review from today.   Objective:   Physical Exam:  There were no vitals taken for this visit.  Physical Exam Constitutional:      General: She is not in acute distress. Musculoskeletal:        General: No deformity.  Neurological:     Mental Status: She is alert and oriented to person, place, and time.     Coordination: Coordination normal.  Psychiatric:        Attention and Perception: Attention and perception normal. She does not perceive auditory or visual hallucinations.        Mood and Affect: Mood normal. Mood is not anxious or depressed. Affect is not labile, blunt, angry or inappropriate.        Speech: Speech normal.        Behavior: Behavior normal.        Thought Content: Thought content normal. Thought content is not paranoid or delusional. Thought content does not include homicidal or suicidal ideation. Thought content does not include homicidal or suicidal plan.        Cognition and Memory: Cognition and memory normal.        Judgment: Judgment normal.     Comments: Insight intact     Lab Review:     Component Value Date/Time   NA 140 05/26/2019 1046   K 3.4 (L) 05/26/2019 1046   CL 95 (L) 05/26/2019 1046   CO2 37 (H) 05/26/2019 1046   GLUCOSE 97 05/26/2019 1046   BUN 17 05/26/2019 1046   CREATININE 1.09 05/26/2019 1046   CALCIUM 9.3 05/26/2019 1046   PROT 6.9 05/26/2019 1046   ALBUMIN 4.2 05/26/2019 1046   AST 36 05/26/2019 1046   ALT 30 05/26/2019 1046   ALKPHOS 98 05/26/2019 1046   BILITOT 0.4 05/26/2019 1046       Component Value Date/Time   WBC 6.9 05/26/2019 1046   RBC 4.42 05/26/2019 1046   HGB 12.6 05/26/2019 1046   HCT 38.7 05/26/2019 1046   PLT 209.0 05/26/2019 1046   MCV 87.6 05/26/2019 1046   MCHC 32.5  05/26/2019 1046   RDW 14.6 05/26/2019 1046   LYMPHSABS 2.0 05/26/2019 1046   MONOABS 0.6 05/26/2019 1046   EOSABS 0.1 05/26/2019 1046   BASOSABS 0.0 05/26/2019 1046    No results found for: POCLITH, LITHIUM   No results found for: PHENYTOIN, PHENOBARB, VALPROATE, CBMZ   .res Assessment: Plan:    Plan:  1. Wellbutrin SR 150mg  in the am  2. Cymbalta 60mg  BID 3. Trazadone 100mg  - 2 at hs  4. Vraylar 1.5mg  to 3mg  daily  5. Add Clonazepam 0.5mg  BID  Continue therapy with Bambi Cottle.   RTC 3 months  Patient advised to contact office with any questions, adverse effects, or acute worsening in signs and symptoms.  Discussed potential metabolic side effects associated with atypical antipsychotics, as well as potential risk for movement side effects. Advised pt to contact office if movement side effects occur.     Diagnoses and all orders for this visit:  Insomnia, unspecified type -     cariprazine (VRAYLAR) capsule; Take 1 capsule (3 mg total) by mouth daily.  Major depressive disorder, recurrent episode, moderate (HCC) -  cariprazine (VRAYLAR) capsule; Take 1 capsule (3 mg total) by mouth daily.  Generalized anxiety disorder -     cariprazine (VRAYLAR) capsule; Take 1 capsule (3 mg total) by mouth daily. -     Discontinue: clonazePAM (KLONOPIN) 0.5 MG disintegrating tablet; Take 1 tablet (0.5 mg total) by mouth 2 (two) times daily. -     clonazePAM (KLONOPIN) 0.5 MG tablet; Take 1 tablet (0.5 mg total) by mouth 2 (two) times daily as needed for anxiety.  Episodic mood disorder (HCC)  PTSD (post-traumatic stress disorder) -     cariprazine (VRAYLAR) capsule; Take 1 capsule (3 mg total) by mouth daily.    Please see After Visit Summary for patient specific instructions.  Future Appointments  Date Time Provider [place]  11/15/2020  2:00 PM Cottle, Lucious Groves, LCSW [facility] None  11/29/2020  2:00 PM Cottle, Bambi G, LCSW [facility] None  12/13/2020  2:00 PM  Cottle, Bambi G, LCSW [facility] None  09/10/2021  9:50 AM [name], Adam R, DO [facility] None    No orders of the defined types were placed in this encounter.     -------------------------------

## 2020-11-06 NOTE — Telephone Encounter (Signed)
Script resent

## 2020-11-07 DIAGNOSIS — M25552 Pain in left hip: Secondary | ICD-10-CM | POA: Diagnosis not present

## 2020-11-07 DIAGNOSIS — M6281 Muscle weakness (generalized): Secondary | ICD-10-CM | POA: Diagnosis not present

## 2020-11-07 DIAGNOSIS — M545 Low back pain, unspecified: Secondary | ICD-10-CM | POA: Diagnosis not present

## 2020-11-14 ENCOUNTER — Telehealth: Payer: Self-pay | Admitting: Adult Health

## 2020-11-14 NOTE — Telephone Encounter (Signed)
Pt left message that she is having problems with her Vraylar. Pt said that it was increased at her last visit, but it made her feel to sedative, so she went back down to 1.5mg . pt said that she is now experiencing more anxiety. Please call.

## 2020-11-14 NOTE — Telephone Encounter (Signed)
Returned call to patient. Did not tolerate increase in Vraylar from 1.5mg  to 3mg . Will continue to take the Vraylar at 1.5mg .

## 2020-11-15 ENCOUNTER — Ambulatory Visit (INDEPENDENT_AMBULATORY_CARE_PROVIDER_SITE_OTHER): Payer: PPO | Admitting: Psychology

## 2020-11-15 DIAGNOSIS — F4310 Post-traumatic stress disorder, unspecified: Secondary | ICD-10-CM | POA: Diagnosis not present

## 2020-11-22 DIAGNOSIS — F419 Anxiety disorder, unspecified: Secondary | ICD-10-CM | POA: Diagnosis not present

## 2020-11-22 DIAGNOSIS — R5383 Other fatigue: Secondary | ICD-10-CM | POA: Diagnosis not present

## 2020-11-22 DIAGNOSIS — L989 Disorder of the skin and subcutaneous tissue, unspecified: Secondary | ICD-10-CM | POA: Diagnosis not present

## 2020-11-22 DIAGNOSIS — E039 Hypothyroidism, unspecified: Secondary | ICD-10-CM | POA: Diagnosis not present

## 2020-11-22 DIAGNOSIS — M549 Dorsalgia, unspecified: Secondary | ICD-10-CM | POA: Diagnosis not present

## 2020-11-29 ENCOUNTER — Ambulatory Visit (INDEPENDENT_AMBULATORY_CARE_PROVIDER_SITE_OTHER): Payer: PPO | Admitting: Psychology

## 2020-11-29 DIAGNOSIS — B078 Other viral warts: Secondary | ICD-10-CM | POA: Diagnosis not present

## 2020-11-29 DIAGNOSIS — F4310 Post-traumatic stress disorder, unspecified: Secondary | ICD-10-CM

## 2020-11-29 DIAGNOSIS — Z85828 Personal history of other malignant neoplasm of skin: Secondary | ICD-10-CM | POA: Diagnosis not present

## 2020-12-04 ENCOUNTER — Telehealth (INDEPENDENT_AMBULATORY_CARE_PROVIDER_SITE_OTHER): Payer: Medicare Other | Admitting: Adult Health

## 2020-12-04 ENCOUNTER — Encounter: Payer: Self-pay | Admitting: Adult Health

## 2020-12-04 DIAGNOSIS — G4700 Insomnia, unspecified: Secondary | ICD-10-CM

## 2020-12-04 DIAGNOSIS — F411 Generalized anxiety disorder: Secondary | ICD-10-CM

## 2020-12-04 DIAGNOSIS — F331 Major depressive disorder, recurrent, moderate: Secondary | ICD-10-CM | POA: Diagnosis not present

## 2020-12-04 DIAGNOSIS — F4310 Post-traumatic stress disorder, unspecified: Secondary | ICD-10-CM

## 2020-12-04 DIAGNOSIS — F39 Unspecified mood [affective] disorder: Secondary | ICD-10-CM

## 2020-12-04 MED ORDER — BUPROPION HCL ER (XL) 300 MG PO TB24: 300.0000 mg | ORAL_TABLET | Freq: Every day | ORAL | 5 refills | Status: DC

## 2020-12-04 NOTE — Progress Notes (Signed)
Paige [name] [id number] 18-Jun-1966 55 y.o.  Virtual Visit via Video Note  I connected with pt @ on 12/04/20 at 11:40 AM EST by a video enabled telemedicine application and verified that I am speaking with the correct person using two identifiers.   I discussed the limitations of evaluation and management by telemedicine and the availability of in person appointments. The patient expressed understanding and agreed to proceed.  I discussed the assessment and treatment plan with the patient. The patient was provided an opportunity to ask questions and all were answered. The patient agreed with the plan and demonstrated an understanding of the instructions.   The patient was advised to call back or seek an in-person evaluation if the symptoms worsen or if the condition fails to improve as anticipated.  I provided 30 minutes of non-face-to-face time during this encounter.  The patient was located at home.  The provider was located at [address].   Aloha Gell, NP   Subjective:   Patient ID:  Paige [name] is a 55 y.o. (DOB October 23, 1966) female.  Chief Complaint: No chief complaint on file.   HPI DI BAGOT presents for follow-up of GAD, MDD, PTSD, Mood disorder, insomnia.  Describes mood today as "ok". Pleasant. Mood symptoms - reports depression, anxiety, and irritability. Stating "I'm feeling kinda blah". Thinking about father. Mother doing well. One of her cats has been sick. Boyfriend traveling. Decreased spending - stopped after buying Christmas presents. Working on [employer] loss-[employer]ivia diet. Decreased interest and motivation - "it has been a little low. Continues to see therapist Chiropractor) every week for PTSD. Taking medications as prescribed.  Energy levels vary. Active, walking daily - 5,000 steps.. Enjoys some usual interests and activities. Single. Dating. Lives alone with 3 cats - and one outside. Mother local and supportive. Appetite  adequate. Weight loss - 32 - 210.5 pounds - [employer]. Sleeps well most nights. Averages 6 to 8 hours. Denies daytime napping.  Focus and concentration "not very good". Starting things and not finishing things. Making lists - finding more she hasn't finished. Completing some household tasks. Manages household tasks. Disabled since 2009. Denies SI or HI.  Denies AH or VH.  Previous medication trials: Lexapro, Paxil, Trazadone, Wellbutrin SR, Geodon, Seroquel, Risperdal, Depakote, Topamax, Lithium, Provigil, Ambien, Cytomel, Clonazepam and other benzodixepaines, Elavil, Effexor, remeron, Prozac, Anafranil, Tofranil, Zoloft, Geodon, Buspar, Adderall, Ritalin, Abilify, Rexulti, Lamictal, Latuda  Review of Systems:  Review of Systems  Musculoskeletal: Negative for gait problem.  Neurological: Negative for tremors.  Psychiatric/Behavioral:       Please refer to HPI    Medications: I have reviewed the patient's current medications.  Current Outpatient Medications  Medication Sig Dispense Refill  . buPROPion (WELLBUTRIN XL) 300 MG 24 hr tablet Take 1 tablet (300 mg total) by mouth daily. 30 tablet 5  . clonazePAM (KLONOPIN) 0.5 MG tablet Take 1 tablet (0.5 mg total) by mouth 2 (two) times daily as needed for anxiety. 30 tablet 2  . dicyclomine (BENTYL) 10 MG capsule Take 1 capsule (10 mg total) by mouth 3 (three) times daily as needed for spasms. 90 capsule 11  . DULoxetine (CYMBALTA) 60 MG capsule Take one capsule twice daily. 60 capsule 5  . Erenumab-aooe (AIMOVIG) 70 MG/ML SOAJ Inject 70 mg into the skin every 28 (twenty-eight) days. 1.12 mL 11  . Fe Fum-Fe Poly-Vit C-Lactobac (FUSION) 65-65-25-30 MG CAPS Take by mouth.    . Ferrous Sulfate Dried (SLOW RELEASE IRON) 45 MG  TBCR Take 1 tablet by mouth daily.    Marland Kitchen ipratropium (ATROVENT) 0.03 % nasal spray Place 2 sprays into both nostrils as needed.  30 mL 12  . ipratropium (ATROVENT) 0.06 % nasal spray INSTILL 2 SPRAYS INTO EACH NOSTRIL AS  NEEDED TWICE A DAY    . ketoconazole (NIZORAL) 2 % shampoo Apply topically.    . lamoTRIgine (LAMICTAL) 25 MG tablet Take by mouth.    . levothyroxine (SYNTHROID, LEVOTHROID) 88 MCG tablet Take 88 mcg by mouth daily before breakfast.    . methocarbamol (ROBAXIN) 500 MG tablet Take 500 mg by mouth 3 (three) times daily. (Patient not taking: Reported on 09/07/2020)    . nystatin cream (MYCOSTATIN) Apply 1 application topically daily as needed for dry skin.    Marland Kitchen omeprazole (PRILOSEC) 20 MG capsule     . ondansetron (ZOFRAN-ODT) 4 MG disintegrating tablet Take 4 mg by mouth every 8 (eight) hours as needed for nausea or vomiting.    . Oxycodone HCl 10 MG TABS Take 10 mg by mouth every 6 (six) hours as needed.    Vladimir Faster Glycol-Propyl Glycol (SYSTANE) 0.4-0.3 % SOLN Apply 1 drop to eye as needed.    . polyethylene glycol (MIRALAX / GLYCOLAX) 17 g packet Take 17 g by mouth 2 (two) times daily.    . potassium chloride (K-DUR,KLOR-CON) 10 MEQ tablet Take 1 tablet by mouth daily. (Patient not taking: Reported on 09/07/2020)    . potassium chloride (KLOR-CON) 10 MEQ tablet Take by mouth.    . promethazine (PHENERGAN) 12.5 MG tablet Take 1 tablet (12.5 mg total) by mouth every 8 (eight) hours as needed for nausea or vomiting. 30 tablet 0  . rizatriptan (MAXALT) 10 MG tablet rizatriptan 10 mg tablet  TAKE 1 TABLET AT EARLIEST ONSET OF MIGRAINE. MAY REPEAT ONCE IN 2 HOURS IF NEEDED (Patient not taking: Reported on 09/07/2020)    . rOPINIRole (REQUIP) 2 MG tablet Take 2 mg by mouth 2 (two) times daily.    Marland Kitchen terconazole (TERAZOL 3) 0.8 % vaginal cream Place 1 applicator vaginally at bedtime.    Marland Kitchen tiZANidine (ZANAFLEX) 2 MG tablet Take 2 mg by mouth 3 (three) times daily. (Patient not taking: Reported on 09/07/2020)    . torsemide (DEMADEX) 20 MG tablet Take 20 mg by mouth daily.     . traZODone (DESYREL) 100 MG tablet Take 2 tablets (200 mg total) by mouth at bedtime. 60 tablet 5  . Ubrogepant (UBRELVY) 100 MG  TABS Take 1 tablet by mouth daily as needed (at onset of headaches may repeat dose after 2 hours as needed max 200 mg in 24 hours). 16 tablet 11   Current Facility-Administered Medications  Medication Dose Route Frequency Provider Last Rate Last Admin  . 0.9 %  sodium chloride infusion  500 mL Intravenous Once Ladene Artist, MD        Medication Side Effects: None  Allergies:  Allergies  Allergen Reactions  . Cyclobenzaprine Other (See Comments)    Other reaction(s): Unknown Hallucinations Unknown   . Meloxicam Other (See Comments)  . Naltrexone Other (See Comments)    SEVERE PAIN ALL OVER  . Pork (Porcine) Protein Other (See Comments)    Other reaction(s): Other (See Comments) Migraines Other reaction(s): Other (See Comments) Migraines  . Sumatriptan Other (See Comments)    Difficulty breathing Other reaction(s): Other (see comments) Off balance   . Tape Dermatitis  . Flexeril [Cyclobenzaprine Hcl]   . Imitrex [Sumatriptan Base]   .  Other   . Sulfa Antibiotics     Past Medical History:  Diagnosis Date  . Allergic rhinitis   . Anemia   . Anxiety disorder   . Chronic fatigue   . Chronic headaches   . Chronic pain   . Depression   . Endometriosis   . Family history of colon cancer    mother,uncles,aunts  . Fibromyalgia   . Gallstones   . GERD (gastroesophageal reflux disease)   . Hypothyroidism   . IBS (irritable bowel syndrome)   . Insomnia   . Iron deficiency   . Laryngopharyngeal reflux (LPR)   . Obesity   . Personal history of colonic polyps 04/2006   polypoid  . PONV (postoperative nausea and vomiting)   . RLS (restless legs syndrome)     Family History  Problem Relation Age of Onset  . Colon cancer Mother        uncles,aunts  . Diabetes Father        paternal grandmother,sister  . Leukemia Father   . Lymphoma Father   . Skin cancer Father   . Heart disease Father   . Diabetes Sister   . Stomach cancer Neg Hx   . Esophageal cancer Neg  Hx   . Rectal cancer Neg Hx     Social History   Socioeconomic History  . Marital status: Single    Spouse name: Not on file  . Number of children: 0  . Years of education: Not on file  . Highest education level: Not on file  Occupational History  . Occupation: RN/disabled  Tobacco Use  . Smoking status: Never Smoker  . Smokeless tobacco: Never Used  Vaping Use  . Vaping Use: Never used  Substance and Sexual Activity  . Alcohol use: Not Currently    Alcohol/week: 0.0 standard drinks    Comment: rarely  . Drug use: No  . Sexual activity: Not on file  Other Topics Concern  . Not on file  Social History Narrative   Patient is left-handed. She lives alone in a one level home. She has 3 cats. She is unable to exercise. Caffeine one soda / day. On disability now. 2 bachelor degrees   Social Determinants of Health   Financial Resource Strain: Not on file  Food Insecurity: Not on file  Transportation Needs: Not on file  Physical Activity: Not on file  Stress: Not on file  Social Connections: Not on file  Intimate Partner Violence: Not on file    Past Medical History, Surgical history, Social history, and Family history were reviewed and updated as appropriate.   Please see review of systems for further details on the patient's review from today.   Objective:   Physical Exam:  There were no vitals taken for this visit.  Physical Exam Constitutional:      General: She is not in acute distress. Musculoskeletal:        General: No deformity.  Neurological:     Mental Status: She is alert and oriented to person, place, and time.     Coordination: Coordination normal.  Psychiatric:        Attention and Perception: Attention and perception normal. She does not perceive auditory or visual hallucinations.        Mood and Affect: Mood normal. Mood is not anxious or depressed. Affect is not labile, blunt, angry or inappropriate.        Speech: Speech normal.         Behavior: Behavior  normal.        Thought Content: Thought content normal. Thought content is not paranoid or delusional. Thought content does not include homicidal or suicidal ideation. Thought content does not include homicidal or suicidal plan.        Cognition and Memory: Cognition and memory normal.        Judgment: Judgment normal.     Comments: Insight intact     Lab Review:     Component Value Date/Time   NA 140 05/26/2019 1046   K 3.4 (L) 05/26/2019 1046   CL 95 (L) 05/26/2019 1046   CO2 37 (H) 05/26/2019 1046   GLUCOSE 97 05/26/2019 1046   BUN 17 05/26/2019 1046   CREATININE 1.09 05/26/2019 1046   CALCIUM 9.3 05/26/2019 1046   PROT 6.9 05/26/2019 1046   ALBUMIN 4.2 05/26/2019 1046   AST 36 05/26/2019 1046   ALT 30 05/26/2019 1046   ALKPHOS 98 05/26/2019 1046   BILITOT 0.4 05/26/2019 1046       Component Value Date/Time   WBC 6.9 05/26/2019 1046   RBC 4.42 05/26/2019 1046   HGB 12.6 05/26/2019 1046   HCT 38.7 05/26/2019 1046   PLT 209.0 05/26/2019 1046   MCV 87.6 05/26/2019 1046   MCHC 32.5 05/26/2019 1046   RDW 14.6 05/26/2019 1046   LYMPHSABS 2.0 05/26/2019 1046   MONOABS 0.6 05/26/2019 1046   EOSABS 0.1 05/26/2019 1046   BASOSABS 0.0 05/26/2019 1046    No results found for: POCLITH, LITHIUM   No results found for: PHENYTOIN, PHENOBARB, VALPROATE, CBMZ   .res Assessment: Plan:     Plan:  1. Increase Wellbutrin XL 150mg  to 300mg  in the am  2. Cymbalta 60mg  BID 3. Trazadone 100mg  - 2 at hs  4. Vraylar 1.5mg  daily  5. Clonazepam 0.5mg  BID  Continue therapy with Bambi Cottle.   RTC 4 weeks  Patient advised to contact office with any questions, adverse effects, or acute worsening in signs and symptoms.  Discussed potential metabolic side effects associated with atypical antipsychotics, as well as potential risk for movement side effects. Advised pt to contact office if movement side effects occur.     Diagnoses and all orders for this  visit:  Insomnia, unspecified type  Major depressive disorder, recurrent episode, moderate (HCC) -     buPROPion (WELLBUTRIN XL) 300 MG 24 hr tablet; Take 1 tablet (300 mg total) by mouth daily.  Generalized anxiety disorder -     buPROPion (WELLBUTRIN XL) 300 MG 24 hr tablet; Take 1 tablet (300 mg total) by mouth daily.  Episodic mood disorder (HCC)  PTSD (post-traumatic stress disorder)     Please see After Visit Summary for patient specific instructions.  Future Appointments  Date Time Provider Department Center  12/13/2020  2:00 PM Cottle, , LCSW [facility] None  12/27/2020  2:00 PM Cottle, Bambi G, LCSW [facility] None  09/10/2021  9:50 AM [name], Adam R, DO [facility] None    No orders of the defined types were placed in this encounter.     -------------------------------

## 2020-12-13 ENCOUNTER — Ambulatory Visit (INDEPENDENT_AMBULATORY_CARE_PROVIDER_SITE_OTHER): Payer: Medicare Other | Admitting: Psychology

## 2020-12-13 DIAGNOSIS — F4310 Post-traumatic stress disorder, unspecified: Secondary | ICD-10-CM

## 2020-12-14 DIAGNOSIS — M797 Fibromyalgia: Secondary | ICD-10-CM | POA: Diagnosis not present

## 2020-12-14 DIAGNOSIS — G894 Chronic pain syndrome: Secondary | ICD-10-CM | POA: Diagnosis not present

## 2020-12-14 DIAGNOSIS — M4326 Fusion of spine, lumbar region: Secondary | ICD-10-CM | POA: Diagnosis not present

## 2020-12-15 DIAGNOSIS — Z1231 Encounter for screening mammogram for malignant neoplasm of breast: Secondary | ICD-10-CM | POA: Diagnosis not present

## 2020-12-27 ENCOUNTER — Ambulatory Visit (INDEPENDENT_AMBULATORY_CARE_PROVIDER_SITE_OTHER): Payer: Medicare Other | Admitting: Psychology

## 2020-12-27 DIAGNOSIS — F4310 Post-traumatic stress disorder, unspecified: Secondary | ICD-10-CM

## 2020-12-28 ENCOUNTER — Encounter: Payer: Self-pay | Admitting: Neurology

## 2020-12-28 DIAGNOSIS — Z79899 Other long term (current) drug therapy: Secondary | ICD-10-CM | POA: Diagnosis not present

## 2020-12-28 NOTE — Progress Notes (Signed)
Paige [name] (Key: [id number]) Rx #: [id number] Aimovig 70MG /ML auto-injectors   Form [employer] [employer] Part D General Authorization Form Created 3 hours ago Sent to Plan 3 hours ago Plan Response 3 hours ago Submit Clinical Questions 3 hours ago Determination Favorable 8 minutes ago Message from Plan Effective from 12/28/2020 through 12/28/2021.

## 2021-01-10 ENCOUNTER — Ambulatory Visit (INDEPENDENT_AMBULATORY_CARE_PROVIDER_SITE_OTHER): Payer: Medicare Other | Admitting: Psychology

## 2021-01-10 DIAGNOSIS — F4310 Post-traumatic stress disorder, unspecified: Secondary | ICD-10-CM | POA: Diagnosis not present

## 2021-01-11 ENCOUNTER — Other Ambulatory Visit: Payer: Self-pay | Admitting: Adult Health

## 2021-01-11 DIAGNOSIS — G4700 Insomnia, unspecified: Secondary | ICD-10-CM

## 2021-01-11 DIAGNOSIS — F331 Major depressive disorder, recurrent, moderate: Secondary | ICD-10-CM

## 2021-01-19 ENCOUNTER — Other Ambulatory Visit: Payer: Self-pay | Admitting: Adult Health

## 2021-01-19 DIAGNOSIS — F331 Major depressive disorder, recurrent, moderate: Secondary | ICD-10-CM

## 2021-01-19 DIAGNOSIS — F4310 Post-traumatic stress disorder, unspecified: Secondary | ICD-10-CM

## 2021-01-19 DIAGNOSIS — F411 Generalized anxiety disorder: Secondary | ICD-10-CM

## 2021-01-19 DIAGNOSIS — G4700 Insomnia, unspecified: Secondary | ICD-10-CM

## 2021-01-24 ENCOUNTER — Ambulatory Visit (INDEPENDENT_AMBULATORY_CARE_PROVIDER_SITE_OTHER): Payer: Medicare Other | Admitting: Psychology

## 2021-01-24 DIAGNOSIS — F4310 Post-traumatic stress disorder, unspecified: Secondary | ICD-10-CM

## 2021-01-30 DIAGNOSIS — M545 Low back pain, unspecified: Secondary | ICD-10-CM | POA: Diagnosis not present

## 2021-02-05 DIAGNOSIS — M545 Low back pain, unspecified: Secondary | ICD-10-CM | POA: Diagnosis not present

## 2021-02-05 DIAGNOSIS — M797 Fibromyalgia: Secondary | ICD-10-CM | POA: Diagnosis not present

## 2021-02-05 DIAGNOSIS — G894 Chronic pain syndrome: Secondary | ICD-10-CM | POA: Diagnosis not present

## 2021-02-05 DIAGNOSIS — M4326 Fusion of spine, lumbar region: Secondary | ICD-10-CM | POA: Diagnosis not present

## 2021-02-07 ENCOUNTER — Telehealth: Payer: Self-pay | Admitting: Adult Health

## 2021-02-07 ENCOUNTER — Ambulatory Visit (INDEPENDENT_AMBULATORY_CARE_PROVIDER_SITE_OTHER): Payer: Medicare Other | Admitting: Psychology

## 2021-02-07 DIAGNOSIS — F332 Major depressive disorder, recurrent severe without psychotic features: Secondary | ICD-10-CM

## 2021-02-07 DIAGNOSIS — F4310 Post-traumatic stress disorder, unspecified: Secondary | ICD-10-CM | POA: Diagnosis not present

## 2021-02-07 NOTE — Telephone Encounter (Signed)
Patient lm requesting samples of Vraylar again if possible.Patient is due in the office tomorrow 3/10 for a follow up appt.

## 2021-02-08 ENCOUNTER — Other Ambulatory Visit: Payer: Self-pay

## 2021-02-08 ENCOUNTER — Encounter: Payer: Self-pay | Admitting: Adult Health

## 2021-02-08 ENCOUNTER — Ambulatory Visit (INDEPENDENT_AMBULATORY_CARE_PROVIDER_SITE_OTHER): Payer: Medicare Other | Admitting: Adult Health

## 2021-02-08 DIAGNOSIS — F411 Generalized anxiety disorder: Secondary | ICD-10-CM | POA: Diagnosis not present

## 2021-02-08 DIAGNOSIS — F331 Major depressive disorder, recurrent, moderate: Secondary | ICD-10-CM

## 2021-02-08 DIAGNOSIS — F4310 Post-traumatic stress disorder, unspecified: Secondary | ICD-10-CM

## 2021-02-08 DIAGNOSIS — G4700 Insomnia, unspecified: Secondary | ICD-10-CM | POA: Diagnosis not present

## 2021-02-08 DIAGNOSIS — F39 Unspecified mood [affective] disorder: Secondary | ICD-10-CM

## 2021-02-08 MED ORDER — LAMOTRIGINE 25 MG PO TABS: ORAL_TABLET | ORAL | 2 refills | Status: DC

## 2021-02-08 MED ORDER — CLONAZEPAM 0.5 MG PO TABS: 0.5000 mg | ORAL_TABLET | Freq: Two times a day (BID) | ORAL | 2 refills | Status: DC | PRN

## 2021-02-08 NOTE — Telephone Encounter (Signed)
Noted. Will gives samples at appointment.

## 2021-02-08 NOTE — Telephone Encounter (Signed)
Asking for samples of Vraylar but has apt this morning with you

## 2021-02-08 NOTE — Progress Notes (Signed)
[name] [id number] [birth year] 55 y.o.  Subjective:   Patient ID:  Paige [name] is a 55 y.o. (DOB 1966-10-30) female.  Chief Complaint: No chief complaint on file.   HPI Paige [name] presents to the office today for follow-up of GAD, MDD, PTSD, insomnia.  Describes mood today as "ok". Pleasant. Mood symptoms - reports depression, anxiety, and irritability. Mood instability - "mostly down". Denies any recent mania. Has been sad since loss of neighbor (age) 20. Increased pain issues. Stating "I have been in a funk". Doesn't feel motivated to do anything". Continues Optivia diet - "I'm stuck". Restarting P/T - back bothering her. Has not been able to keep up the exercises she was doing. Decreased interest and motivation. Continues to see therapist Chiropractor) every other week for PTSD. Taking medications as prescribed.  Energy levels improved. Active, walking daily. Enjoys some usual interests and activities. Single. Dating. Lives alone with 3 cats - and one outside. Mother local and supportive. Appetite adequate. Weight stable - 210 pounds. Sleeps well most nights. Averages 4 to 7 hours. Denies daytime napping.  Focus and concentration "pitiful". Not completing household tasks. Disabled since 2009. Denies SI or HI.  Denies AH or VH.  Previous medication trials: Lexapro, Paxil, Trazadone, Wellbutrin SR, Geodon, Seroquel, Risperdal, Depakote, Topamax, Lithium, Provigil, Ambien, Cytomel, Clonazepam and other benzodixepaines, Elavil, Effexor, Remeron, Prozac, Anafranil, Tofranil, Zoloft, Geodon, Buspar, Adderall, Ritalin, Abilify, Rexulti, Lamictal, Latuda    Mini-Mental   Flowsheet Row Office Visit from 04/07/2018 in [facility] [place]  Total Score (max 30 points ) 29    PHQ2-9   [place] [facility] Outreach from 05/22/2018 in [place] [facility] from 05/13/2018 in [facility] [facility] Telephone from 04/30/2018 in  [place]  PHQ-2 Total Score 5 5 2   PHQ-9 Total Score 20 18 13        Review of Systems:  Review of Systems  Musculoskeletal: Negative for gait problem.  Neurological: Negative for tremors.  Psychiatric/Behavioral:       Please refer to HPI    Medications: I have reviewed the patient's current medications.  Current Outpatient Medications  Medication Sig Dispense Refill  . buPROPion (WELLBUTRIN XL) 300 MG 24 hr tablet Take 1 tablet (300 mg total) by mouth daily. 30 tablet 5  . clonazePAM (KLONOPIN) 0.5 MG tablet Take 1 tablet (0.5 mg total) by mouth 2 (two) times daily as needed for anxiety. 30 tablet 2  . dicyclomine (BENTYL) 10 MG capsule Take 1 capsule (10 mg total) by mouth 3 (three) times daily as needed for spasms. 90 capsule 11  . DULoxetine (CYMBALTA) 60 MG capsule TAKE 1 CAPSULE TWICE DAILY. 60 capsule 4  . Erenumab-aooe (AIMOVIG) 70 MG/ML SOAJ Inject 70 mg into the skin every 28 (twenty-eight) days. 1.12 mL 11  . Fe Fum-Fe Poly-Vit C-Lactobac (FUSION) 65-65-25-30 MG CAPS Take by mouth.    . Ferrous Sulfate Dried (SLOW RELEASE IRON) 45 MG TBCR Take 1 tablet by mouth daily.    Marland Kitchen ipratropium (ATROVENT) 0.03 % nasal spray Place 2 sprays into both nostrils as needed.  30 mL 12  . ipratropium (ATROVENT) 0.06 % nasal spray INSTILL 2 SPRAYS INTO EACH NOSTRIL AS NEEDED TWICE A DAY    . ketoconazole (NIZORAL) 2 % shampoo Apply topically.    . lamoTRIgine (LAMICTAL) 25 MG tablet Take by mouth.    . levothyroxine (SYNTHROID, LEVOTHROID) 88 MCG tablet Take 88 mcg by mouth daily before breakfast.    .  methocarbamol (ROBAXIN) 500 MG tablet Take 500 mg by mouth 3 (three) times daily. (Patient not taking: Reported on 09/07/2020)    . nystatin cream (MYCOSTATIN) Apply 1 application topically daily as needed for dry skin.    Marland Kitchen omeprazole (PRILOSEC) 20 MG capsule     . ondansetron (ZOFRAN-ODT) 4 MG disintegrating tablet Take 4 mg by mouth every 8 (eight) hours as needed for nausea  or vomiting.    . Oxycodone HCl 10 MG TABS Take 10 mg by mouth every 6 (six) hours as needed.    Vladimir Faster Glycol-Propyl Glycol (SYSTANE) 0.4-0.3 % SOLN Apply 1 drop to eye as needed.    . polyethylene glycol (MIRALAX / GLYCOLAX) 17 g packet Take 17 g by mouth 2 (two) times daily.    . potassium chloride (K-DUR,KLOR-CON) 10 MEQ tablet Take 1 tablet by mouth daily. (Patient not taking: Reported on 09/07/2020)    . potassium chloride (KLOR-CON) 10 MEQ tablet Take by mouth.    . promethazine (PHENERGAN) 12.5 MG tablet Take 1 tablet (12.5 mg total) by mouth every 8 (eight) hours as needed for nausea or vomiting. 30 tablet 0  . rizatriptan (MAXALT) 10 MG tablet rizatriptan 10 mg tablet  TAKE 1 TABLET AT EARLIEST ONSET OF MIGRAINE. MAY REPEAT ONCE IN 2 HOURS IF NEEDED (Patient not taking: Reported on 09/07/2020)    . rOPINIRole (REQUIP) 2 MG tablet Take 2 mg by mouth 2 (two) times daily.    Marland Kitchen terconazole (TERAZOL 3) 0.8 % vaginal cream Place 1 applicator vaginally at bedtime.    Marland Kitchen tiZANidine (ZANAFLEX) 2 MG tablet Take 2 mg by mouth 3 (three) times daily. (Patient not taking: Reported on 09/07/2020)    . torsemide (DEMADEX) 20 MG tablet Take 20 mg by mouth daily.     . traZODone (DESYREL) 100 MG tablet TAKE 2 TABLETS BY MOUTH AT BEDTIME. 60 tablet 4  . Ubrogepant (UBRELVY) 100 MG TABS Take 1 tablet by mouth daily as needed (at onset of headaches may repeat dose after 2 hours as needed max 200 mg in 24 hours). 16 tablet 11   Current Facility-Administered Medications  Medication Dose Route Frequency Provider Last Rate Last Admin  . 0.9 %  sodium chloride infusion  500 mL Intravenous Once Ladene Artist, MD        Medication Side Effects: None  Allergies:  Allergies  Allergen Reactions  . Cyclobenzaprine Other (See Comments)    Other reaction(s): Unknown Hallucinations Unknown   . Meloxicam Other (See Comments)  . Naltrexone Other (See Comments)    SEVERE PAIN ALL OVER  . Pork (Porcine)  Protein Other (See Comments)    Other reaction(s): Other (See Comments) Migraines Other reaction(s): Other (See Comments) Migraines  . Sumatriptan Other (See Comments)    Difficulty breathing Other reaction(s): Other (see comments) Off balance   . Tape Dermatitis  . Flexeril [Cyclobenzaprine Hcl]   . Imitrex [Sumatriptan Base]   . Other   . Sulfa Antibiotics     Past Medical History:  Diagnosis Date  . Allergic rhinitis   . Anemia   . Anxiety disorder   . Chronic fatigue   . Chronic headaches   . Chronic pain   . Depression   . Endometriosis   . Family history of colon cancer    mother,uncles,aunts  . Fibromyalgia   . Gallstones   . GERD (gastroesophageal reflux disease)   . Hypothyroidism   . IBS (irritable bowel syndrome)   . Insomnia   .  Iron deficiency   . Laryngopharyngeal reflux (LPR)   . Obesity   . Personal history of colonic polyps 04/2006   polypoid  . PONV (postoperative nausea and vomiting)   . RLS (restless legs syndrome)     Family History  Problem Relation Age of Onset  . Colon cancer Mother        uncles,aunts  . Diabetes Father        paternal grandmother,sister  . Leukemia Father   . Lymphoma Father   . Skin cancer Father   . Heart disease Father   . Diabetes Sister   . Stomach cancer Neg Hx   . Esophageal cancer Neg Hx   . Rectal cancer Neg Hx     Social History   Socioeconomic History  . Marital status: Single    Spouse name: Not on file  . Number of children: 0  . Years of education: Not on file  . Highest education level: Not on file  Occupational History  . Occupation: RN/disabled  Tobacco Use  . Smoking status: Never Smoker  . Smokeless tobacco: Never Used  Vaping Use  . Vaping Use: Never used  Substance and Sexual Activity  . Alcohol use: Not Currently    Alcohol/week: 0.0 standard drinks    Comment: rarely  . Drug use: No  . Sexual activity: Not on file  Other Topics Concern  . Not on file  Social History  Narrative   Patient is left-handed. She lives alone in a one level home. She has 3 cats. She is unable to exercise. Caffeine one soda / day. On disability now. 2 bachelor degrees   Social Determinants of Health   Financial Resource Strain: Not on file  Food Insecurity: Not on file  Transportation Needs: Not on file  Physical Activity: Not on file  Stress: Not on file  Social Connections: Not on file  Intimate Partner Violence: Not on file    Past Medical History, Surgical history, Social history, and Family history were reviewed and updated as appropriate.   Please see review of systems for further details on the patient's review from today.   Objective:   Physical Exam:  There were no vitals taken for this visit.  Physical Exam Constitutional:      General: She is not in acute distress. Musculoskeletal:        General: No deformity.  Neurological:     Mental Status: She is alert and oriented to person, place, and time.     Coordination: Coordination normal.  Psychiatric:        Attention and Perception: Attention and perception normal. She does not perceive auditory or visual hallucinations.        Mood and Affect: Mood normal. Mood is not anxious or depressed. Affect is not labile, blunt, angry or inappropriate.        Speech: Speech normal.        Behavior: Behavior normal.        Thought Content: Thought content normal. Thought content is not paranoid or delusional. Thought content does not include homicidal or suicidal ideation. Thought content does not include homicidal or suicidal plan.        Cognition and Memory: Cognition and memory normal.        Judgment: Judgment normal.     Comments: Insight intact     Lab Review:     Component Value Date/Time   NA 140 05/26/2019 1046   K 3.4 (L) 05/26/2019 1046  CL 95 (L) 05/26/2019 1046   CO2 37 (H) 05/26/2019 1046   GLUCOSE 97 05/26/2019 1046   BUN 17 05/26/2019 1046   CREATININE 1.09 05/26/2019 1046   CALCIUM  9.3 05/26/2019 1046   PROT 6.9 05/26/2019 1046   ALBUMIN 4.2 05/26/2019 1046   AST 36 05/26/2019 1046   ALT 30 05/26/2019 1046   ALKPHOS 98 05/26/2019 1046   BILITOT 0.4 05/26/2019 1046       Component Value Date/Time   WBC 6.9 05/26/2019 1046   RBC 4.42 05/26/2019 1046   HGB 12.6 05/26/2019 1046   HCT 38.7 05/26/2019 1046   PLT 209.0 05/26/2019 1046   MCV 87.6 05/26/2019 1046   MCHC 32.5 05/26/2019 1046   RDW 14.6 05/26/2019 1046   LYMPHSABS 2.0 05/26/2019 1046   MONOABS 0.6 05/26/2019 1046   EOSABS 0.1 05/26/2019 1046   BASOSABS 0.0 05/26/2019 1046    No results found for: POCLITH, LITHIUM   No results found for: PHENYTOIN, PHENOBARB, VALPROATE, CBMZ   .res Assessment: Plan:    Plan:  1. Wellbutrin XL 300mg  to 150mg  daily - in the am  2. Cymbalta 60mg  BID 3. Trazadone 100mg  - 2 at hs  4. Vraylar 1.5mg  daily  5. Clonazepam 0.5mg  BID 6. Add Lamictal 25mg  at bedtime for 2 weeks, then take two tablets at bedtime.  Continue therapy with Bambi Cottle.   RTC 4 weeks  Patient advised to contact office with any questions, adverse effects, or acute worsening in signs and symptoms.  Discussed potential metabolic side effects associated with atypical antipsychotics, as well as potential risk for movement side effects. Advised pt to contact office if movement side effects occur.     There are no diagnoses linked to this encounter.   Please see After Visit Summary for patient specific instructions.  Future Appointments  Date Time Provider [place]  02/08/2021  9:40 AM Yudit Modesitt, Berdie Ogren, NP [facility] None  02/21/2021  2:00 PM Cottle, Bambi G, LCSW [facility] None  09/10/2021  9:50 AM [name], Adam R, DO [facility] None    No orders of the defined types were placed in this encounter.   -------------------------------

## 2021-02-09 DIAGNOSIS — M545 Low back pain, unspecified: Secondary | ICD-10-CM | POA: Diagnosis not present

## 2021-02-13 DIAGNOSIS — M545 Low back pain, unspecified: Secondary | ICD-10-CM | POA: Diagnosis not present

## 2021-02-15 DIAGNOSIS — M545 Low back pain, unspecified: Secondary | ICD-10-CM | POA: Diagnosis not present

## 2021-02-19 DIAGNOSIS — H1045 Other chronic allergic conjunctivitis: Secondary | ICD-10-CM | POA: Diagnosis not present

## 2021-02-19 DIAGNOSIS — H04123 Dry eye syndrome of bilateral lacrimal glands: Secondary | ICD-10-CM | POA: Diagnosis not present

## 2021-02-19 DIAGNOSIS — H2513 Age-related nuclear cataract, bilateral: Secondary | ICD-10-CM | POA: Diagnosis not present

## 2021-02-19 DIAGNOSIS — H25013 Cortical age-related cataract, bilateral: Secondary | ICD-10-CM | POA: Diagnosis not present

## 2021-02-20 ENCOUNTER — Telehealth: Payer: Self-pay | Admitting: Adult Health

## 2021-02-20 DIAGNOSIS — M545 Low back pain, unspecified: Secondary | ICD-10-CM | POA: Diagnosis not present

## 2021-02-20 NOTE — Telephone Encounter (Signed)
Pt LM on VM stating not able to take Lamotrigine. Arms & legs very jittery, stayed up 3 nights. Stopped. Advise what to take. [phone]

## 2021-02-20 NOTE — Telephone Encounter (Signed)
Please review

## 2021-02-20 NOTE — Telephone Encounter (Signed)
Pt informed me the side effects have gone away

## 2021-02-20 NOTE — Telephone Encounter (Signed)
Have her update [place] once all the side effects have subsided and we can discuss.

## 2021-02-21 ENCOUNTER — Ambulatory Visit (INDEPENDENT_AMBULATORY_CARE_PROVIDER_SITE_OTHER): Payer: Medicare Other | Admitting: Psychology

## 2021-02-21 DIAGNOSIS — M479 Spondylosis, unspecified: Secondary | ICD-10-CM | POA: Diagnosis not present

## 2021-02-21 DIAGNOSIS — L299 Pruritus, unspecified: Secondary | ICD-10-CM | POA: Diagnosis not present

## 2021-02-21 DIAGNOSIS — F4310 Post-traumatic stress disorder, unspecified: Secondary | ICD-10-CM

## 2021-02-21 DIAGNOSIS — F332 Major depressive disorder, recurrent severe without psychotic features: Secondary | ICD-10-CM | POA: Diagnosis not present

## 2021-02-21 DIAGNOSIS — F322 Major depressive disorder, single episode, severe without psychotic features: Secondary | ICD-10-CM | POA: Diagnosis not present

## 2021-02-21 DIAGNOSIS — E039 Hypothyroidism, unspecified: Secondary | ICD-10-CM | POA: Diagnosis not present

## 2021-02-21 NOTE — Telephone Encounter (Signed)
Noted. Please advise patient - I will review chart/previous medications for further options.

## 2021-02-24 ENCOUNTER — Telehealth: Payer: Self-pay | Admitting: Adult Health

## 2021-02-24 NOTE — Telephone Encounter (Signed)
Called patient on 02/24/2021 to discuss medications options. Unable to speak with patient, but did leave a VM noting she would receive a cal back on Monday 02/26/2022.

## 2021-02-26 ENCOUNTER — Other Ambulatory Visit: Payer: Self-pay | Admitting: Adult Health

## 2021-02-26 DIAGNOSIS — F331 Major depressive disorder, recurrent, moderate: Secondary | ICD-10-CM

## 2021-02-26 DIAGNOSIS — F411 Generalized anxiety disorder: Secondary | ICD-10-CM

## 2021-02-26 MED ORDER — BUPROPION HCL ER (XL) 150 MG PO TB24: ORAL_TABLET | ORAL | 2 refills | Status: DC

## 2021-02-27 DIAGNOSIS — M545 Low back pain, unspecified: Secondary | ICD-10-CM | POA: Diagnosis not present

## 2021-03-01 DIAGNOSIS — E785 Hyperlipidemia, unspecified: Secondary | ICD-10-CM | POA: Diagnosis not present

## 2021-03-01 DIAGNOSIS — E039 Hypothyroidism, unspecified: Secondary | ICD-10-CM | POA: Diagnosis not present

## 2021-03-01 DIAGNOSIS — G43709 Chronic migraine without aura, not intractable, without status migrainosus: Secondary | ICD-10-CM | POA: Diagnosis not present

## 2021-03-02 DIAGNOSIS — M545 Low back pain, unspecified: Secondary | ICD-10-CM | POA: Diagnosis not present

## 2021-03-06 DIAGNOSIS — M545 Low back pain, unspecified: Secondary | ICD-10-CM | POA: Diagnosis not present

## 2021-03-07 ENCOUNTER — Ambulatory Visit (INDEPENDENT_AMBULATORY_CARE_PROVIDER_SITE_OTHER): Payer: Medicare Other | Admitting: Psychology

## 2021-03-07 DIAGNOSIS — F4310 Post-traumatic stress disorder, unspecified: Secondary | ICD-10-CM

## 2021-03-08 DIAGNOSIS — M545 Low back pain, unspecified: Secondary | ICD-10-CM | POA: Diagnosis not present

## 2021-03-22 ENCOUNTER — Other Ambulatory Visit: Payer: Self-pay

## 2021-03-22 ENCOUNTER — Ambulatory Visit (INDEPENDENT_AMBULATORY_CARE_PROVIDER_SITE_OTHER): Payer: Medicare Other | Admitting: Adult Health

## 2021-03-22 ENCOUNTER — Encounter: Payer: Self-pay | Admitting: Adult Health

## 2021-03-22 DIAGNOSIS — G4700 Insomnia, unspecified: Secondary | ICD-10-CM

## 2021-03-22 DIAGNOSIS — F4310 Post-traumatic stress disorder, unspecified: Secondary | ICD-10-CM

## 2021-03-22 DIAGNOSIS — F411 Generalized anxiety disorder: Secondary | ICD-10-CM | POA: Diagnosis not present

## 2021-03-22 DIAGNOSIS — F331 Major depressive disorder, recurrent, moderate: Secondary | ICD-10-CM | POA: Diagnosis not present

## 2021-03-22 NOTE — Progress Notes (Signed)
Paige [name] [id number] 01-14-1966 55 y.o.  Subjective:   Patient ID:  Paige [name] is a 55 y.o. (DOB January 06, 1966) female.  Chief Complaint: No chief complaint on file.   HPI Paige [name] presents to the office today for follow-up of GAD, MDD, PTSD, insomnia.  Describes mood today as "ok". Pleasant. Mood symptoms - reports decreased depression, anxiety, and irritability with increase in Wellbutrin. Denies mania. Mood "level" - "more on the down side still". Stating "my mother and I have noticed an improvement". Recently lost 61 year old cat - respiratory issues. Relationship did not work out like she had hoped. She and friend had a "falling" out. 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 Stopped Optivia diet - "I gave up on that". Has finished with P/T. Improved interest and motivation. Journaling. Continues to see therapist Chiropractor) every other week for PTSD. Taking medications as prescribed.  Energy levels improved. Active, walking daily. Plans to return to the gym. Enjoys some usual interests and activities. Single. Dating. Lives alone with 2 cats - and one outside. Mother local and supportive. Appetite adequate. Weight stable - 210 pounds. Sleeps well most nights. Averages 4 to 7 hours. Denies daytime napping.  Focus and concentration "not very good". Doing "a little bit" around the house. Disabled since 2009. Denies SI or HI.  Denies AH or VH.  Previous medication trials: Lexapro, Paxil, Trazadone, Wellbutrin SR, Geodon, Seroquel, Risperdal, Depakote, Topamax, Lithium, Provigil, Ambien, Cytomel, Clonazepam and other benzodixepaines, Elavil, Effexor, Remeron, Prozac, Anafranil, Tofranil, Zoloft, Geodon, Buspar, Adderall, Ritalin, Abilify, Rexulti, Lamictal, Latuda     Mini-Mental   Flowsheet Row Office Visit from 04/07/2018 in [facility] [place]  Total Score (max 30 points ) 29    PHQ2-9   [place] Patient Outreach from 05/22/2018 in  [place] [facility] Outreach from 05/13/2018 in [place] [facility] Telephone from 04/30/2018 in [place]  PHQ-2 Total Score 5 5 2   PHQ-9 Total Score 20 18 13        Review of Systems:  Review of Systems  Musculoskeletal: Negative for gait problem.  Neurological: Negative for tremors.  Psychiatric/Behavioral:       Please refer to HPI    Medications: I have reviewed the patient's current medications.  Current Outpatient Medications  Medication Sig Dispense Refill  . buPROPion (WELLBUTRIN XL) 150 MG 24 hr tablet Take three tablets every morning. 90 tablet 2  . clonazePAM (KLONOPIN) 0.5 MG tablet Take 1 tablet (0.5 mg total) by mouth 2 (two) times daily as needed for anxiety. 30 tablet 2  . dicyclomine (BENTYL) 10 MG capsule Take 1 capsule (10 mg total) by mouth 3 (three) times daily as needed for spasms. 90 capsule 11  . DULoxetine (CYMBALTA) 60 MG capsule TAKE 1 CAPSULE TWICE DAILY. 60 capsule 4  . Erenumab-aooe (AIMOVIG) 70 MG/ML SOAJ Inject 70 mg into the skin every 28 (twenty-eight) days. 1.12 mL 11  . Fe Fum-Fe Poly-Vit C-Lactobac (FUSION) 65-65-25-30 MG CAPS Take by mouth.    . Ferrous Sulfate Dried (SLOW RELEASE IRON) 45 MG TBCR Take 1 tablet by mouth daily.    Marland Kitchen ipratropium (ATROVENT) 0.03 % nasal spray Place 2 sprays into both nostrils as needed.  30 mL 12  . ipratropium (ATROVENT) 0.06 % nasal spray INSTILL 2 SPRAYS INTO EACH NOSTRIL AS NEEDED TWICE A DAY    . ketoconazole (NIZORAL) 2 % shampoo Apply topically.    Marland Kitchen levothyroxine (SYNTHROID, LEVOTHROID) 88 MCG tablet  Take 88 mcg by mouth daily before breakfast.    . methocarbamol (ROBAXIN) 500 MG tablet Take 500 mg by mouth 3 (three) times daily. (Patient not taking: Reported on 09/07/2020)    . nystatin cream (MYCOSTATIN) Apply 1 application topically daily as needed for dry skin.    Marland Kitchen omeprazole (PRILOSEC) 20 MG capsule     . ondansetron (ZOFRAN-ODT) 4 MG disintegrating tablet  Take 4 mg by mouth every 8 (eight) hours as needed for nausea or vomiting.    . Oxycodone HCl 10 MG TABS Take 10 mg by mouth every 6 (six) hours as needed.    Vladimir Faster Glycol-Propyl Glycol (SYSTANE) 0.4-0.3 % SOLN Apply 1 drop to eye as needed.    . polyethylene glycol (MIRALAX / GLYCOLAX) 17 g packet Take 17 g by mouth 2 (two) times daily.    . potassium chloride (K-DUR,KLOR-CON) 10 MEQ tablet Take 1 tablet by mouth daily. (Patient not taking: Reported on 09/07/2020)    . potassium chloride (KLOR-CON) 10 MEQ tablet Take by mouth.    . promethazine (PHENERGAN) 12.5 MG tablet Take 1 tablet (12.5 mg total) by mouth every 8 (eight) hours as needed for nausea or vomiting. 30 tablet 0  . rizatriptan (MAXALT) 10 MG tablet rizatriptan 10 mg tablet  TAKE 1 TABLET AT EARLIEST ONSET OF MIGRAINE. MAY REPEAT ONCE IN 2 HOURS IF NEEDED (Patient not taking: Reported on 09/07/2020)    . rOPINIRole (REQUIP) 2 MG tablet Take 2 mg by mouth 2 (two) times daily.    Marland Kitchen terconazole (TERAZOL 3) 0.8 % vaginal cream Place 1 applicator vaginally at bedtime.    Marland Kitchen tiZANidine (ZANAFLEX) 2 MG tablet Take 2 mg by mouth 3 (three) times daily. (Patient not taking: Reported on 09/07/2020)    . torsemide (DEMADEX) 20 MG tablet Take 20 mg by mouth daily.     . traZODone (DESYREL) 100 MG tablet TAKE 2 TABLETS BY MOUTH AT BEDTIME. 60 tablet 4  . Ubrogepant (UBRELVY) 100 MG TABS Take 1 tablet by mouth daily as needed (at onset of headaches may repeat dose after 2 hours as needed max 200 mg in 24 hours). 16 tablet 11   Current Facility-Administered Medications  Medication Dose Route Frequency Provider Last Rate Last Admin  . 0.9 %  sodium chloride infusion  500 mL Intravenous Once Ladene Artist, MD        Medication Side Effects: None  Allergies:  Allergies  Allergen Reactions  . Cyclobenzaprine Other (See Comments)    Other reaction(s): Unknown Hallucinations Unknown   . Meloxicam Other (See Comments)  . Naltrexone Other  (See Comments)    SEVERE PAIN ALL OVER  . Pork (Porcine) Protein Other (See Comments)    Other reaction(s): Other (See Comments) Migraines Other reaction(s): Other (See Comments) Migraines  . Sumatriptan Other (See Comments)    Difficulty breathing Other reaction(s): Other (see comments) Off balance   . Tape Dermatitis  . Flexeril [Cyclobenzaprine Hcl]   . Imitrex [Sumatriptan Base]   . Other   . Sulfa Antibiotics     Past Medical History:  Diagnosis Date  . Allergic rhinitis   . Anemia   . Anxiety disorder   . Chronic fatigue   . Chronic headaches   . Chronic pain   . Depression   . Endometriosis   . Family history of colon cancer    mother,uncles,aunts  . Fibromyalgia   . Gallstones   . GERD (gastroesophageal reflux disease)   . Hypothyroidism   .  IBS (irritable bowel syndrome)   . Insomnia   . Iron deficiency   . Laryngopharyngeal reflux (LPR)   . Obesity   . Personal history of colonic polyps 04/2006   polypoid  . PONV (postoperative nausea and vomiting)   . RLS (restless legs syndrome)     Past Medical History, Surgical history, Social history, and Family history were reviewed and updated as appropriate.   Please see review of systems for further details on the patient's review from today.   Objective:   Physical Exam:  There were no vitals taken for this visit.  Physical Exam Constitutional:      General: She is not in acute distress. Musculoskeletal:        General: No deformity.  Neurological:     Mental Status: She is alert and oriented to person, place, and time.     Coordination: Coordination normal.  Psychiatric:        Attention and Perception: Attention and perception normal. She does not perceive auditory or visual hallucinations.        Mood and Affect: Mood normal. Mood is not anxious or depressed. Affect is not labile, blunt, angry or inappropriate.        Speech: Speech normal.        Behavior: Behavior normal.        Thought  Content: Thought content normal. Thought content is not paranoid or delusional. Thought content does not include homicidal or suicidal ideation. Thought content does not include homicidal or suicidal plan.        Cognition and Memory: Cognition and memory normal.        Judgment: Judgment normal.     Comments: Insight intact     Lab Review:     Component Value Date/Time   NA 140 05/26/2019 1046   K 3.4 (L) 05/26/2019 1046   CL 95 (L) 05/26/2019 1046   CO2 37 (H) 05/26/2019 1046   GLUCOSE 97 05/26/2019 1046   BUN 17 05/26/2019 1046   CREATININE 1.09 05/26/2019 1046   CALCIUM 9.3 05/26/2019 1046   PROT 6.9 05/26/2019 1046   ALBUMIN 4.2 05/26/2019 1046   AST 36 05/26/2019 1046   ALT 30 05/26/2019 1046   ALKPHOS 98 05/26/2019 1046   BILITOT 0.4 05/26/2019 1046       Component Value Date/Time   WBC 6.9 05/26/2019 1046   RBC 4.42 05/26/2019 1046   HGB 12.6 05/26/2019 1046   HCT 38.7 05/26/2019 1046   PLT 209.0 05/26/2019 1046   MCV 87.6 05/26/2019 1046   MCHC 32.5 05/26/2019 1046   RDW 14.6 05/26/2019 1046   LYMPHSABS 2.0 05/26/2019 1046   MONOABS 0.6 05/26/2019 1046   EOSABS 0.1 05/26/2019 1046   BASOSABS 0.0 05/26/2019 1046    No results found for: POCLITH, LITHIUM   No results found for: PHENYTOIN, PHENOBARB, VALPROATE, CBMZ   .res Assessment: Plan:    Plan:  1. Wellbutrin XL 450mg  daily - in the am  2. Cymbalta 60mg  BID 3. Trazadone 100mg  - 2 at hs  4. Vraylar 1.5mg  daily - samples given 5. Clonazepam 0.5mg  BID 6. D/C Lamictal 25mg  at bedtime for 2 weeks, then take two tablets at bedtime - jittery  Continue therapy with Bambi Cottle.   RTC 6 weeks  Patient advised to contact office with any questions, adverse effects, or acute worsening in signs and symptoms.  Discussed potential metabolic side effects associated with atypical antipsychotics, as well as potential risk for movement side effects.  Advised pt to contact office if movement side effects occur.      Diagnoses and all orders for this visit:  Major depressive disorder, recurrent episode, moderate (HCC)  Generalized anxiety disorder  Insomnia, unspecified type  PTSD (post-traumatic stress disorder)     Please see After Visit Summary for patient specific instructions.  Future Appointments  Date Time Provider [place]  03/26/2021  8:00 AM Cottle, Lucious Groves, LCSW [facility] None  04/04/2021  2:00 PM Cottle, Bambi G, LCSW [facility] None  04/18/2021  2:00 PM Cottle, Bambi G, LCSW [facility] None  05/02/2021  2:00 PM Cottle, Bambi G, LCSW [facility] None  05/16/2021  2:00 PM Cottle, Bambi G, LCSW [facility] None  05/30/2021  2:00 PM Cottle, Bambi G, LCSW [facility] None  09/10/2021  9:50 AM [name], Adam R, DO [facility] None    No orders of the defined types were placed in this encounter.   -------------------------------

## 2021-03-26 ENCOUNTER — Ambulatory Visit (INDEPENDENT_AMBULATORY_CARE_PROVIDER_SITE_OTHER): Payer: Medicare Other | Admitting: Psychology

## 2021-03-26 DIAGNOSIS — F4310 Post-traumatic stress disorder, unspecified: Secondary | ICD-10-CM | POA: Diagnosis not present

## 2021-04-04 ENCOUNTER — Ambulatory Visit (INDEPENDENT_AMBULATORY_CARE_PROVIDER_SITE_OTHER): Payer: Medicare Other | Admitting: Psychology

## 2021-04-04 DIAGNOSIS — F4310 Post-traumatic stress disorder, unspecified: Secondary | ICD-10-CM

## 2021-04-18 ENCOUNTER — Ambulatory Visit (INDEPENDENT_AMBULATORY_CARE_PROVIDER_SITE_OTHER): Payer: Medicare Other | Admitting: Psychology

## 2021-04-18 DIAGNOSIS — F4310 Post-traumatic stress disorder, unspecified: Secondary | ICD-10-CM

## 2021-04-25 ENCOUNTER — Telehealth: Payer: Self-pay | Admitting: Adult Health

## 2021-04-25 NOTE — Telephone Encounter (Signed)
Pt brought in paperwork for med assistance to complete. Put on Traci's desk.

## 2021-05-01 DIAGNOSIS — G43709 Chronic migraine without aura, not intractable, without status migrainosus: Secondary | ICD-10-CM | POA: Diagnosis not present

## 2021-05-01 DIAGNOSIS — G4733 Obstructive sleep apnea (adult) (pediatric): Secondary | ICD-10-CM | POA: Diagnosis not present

## 2021-05-01 DIAGNOSIS — E785 Hyperlipidemia, unspecified: Secondary | ICD-10-CM | POA: Diagnosis not present

## 2021-05-01 DIAGNOSIS — E039 Hypothyroidism, unspecified: Secondary | ICD-10-CM | POA: Diagnosis not present

## 2021-05-02 ENCOUNTER — Ambulatory Visit (INDEPENDENT_AMBULATORY_CARE_PROVIDER_SITE_OTHER): Payer: Medicare Other | Admitting: Psychology

## 2021-05-02 DIAGNOSIS — F4310 Post-traumatic stress disorder, unspecified: Secondary | ICD-10-CM | POA: Diagnosis not present

## 2021-05-02 NOTE — Progress Notes (Signed)
Paige [name] 100mg  [place] [id number] is pending. Sent to [employer] [place]. Pending status.

## 2021-05-03 ENCOUNTER — Encounter: Payer: Self-pay | Admitting: Adult Health

## 2021-05-03 ENCOUNTER — Ambulatory Visit (INDEPENDENT_AMBULATORY_CARE_PROVIDER_SITE_OTHER): Payer: Medicare Other | Admitting: Adult Health

## 2021-05-03 ENCOUNTER — Other Ambulatory Visit: Payer: Self-pay

## 2021-05-03 DIAGNOSIS — G4700 Insomnia, unspecified: Secondary | ICD-10-CM | POA: Diagnosis not present

## 2021-05-03 DIAGNOSIS — F411 Generalized anxiety disorder: Secondary | ICD-10-CM | POA: Diagnosis not present

## 2021-05-03 DIAGNOSIS — F331 Major depressive disorder, recurrent, moderate: Secondary | ICD-10-CM

## 2021-05-03 DIAGNOSIS — F4310 Post-traumatic stress disorder, unspecified: Secondary | ICD-10-CM

## 2021-05-03 MED ORDER — BUPROPION HCL ER (XL) 150 MG PO TB24: ORAL_TABLET | ORAL | 5 refills | Status: DC

## 2021-05-03 MED ORDER — CLONAZEPAM 0.5 MG PO TABS
0.5000 mg | ORAL_TABLET | Freq: Two times a day (BID) | ORAL | 2 refills | Status: DC | PRN
Start: 2021-05-03 — End: 2021-11-30

## 2021-05-03 MED ORDER — DULOXETINE HCL 60 MG PO CPEP: ORAL_CAPSULE | ORAL | 5 refills | Status: DC

## 2021-05-03 MED ORDER — TRAZODONE HCL 100 MG PO TABS: 200.0000 mg | ORAL_TABLET | Freq: Every day | ORAL | 5 refills | Status: DC

## 2021-05-03 NOTE — Progress Notes (Signed)
Paige [name] [id number] 02/21/1966 55 y.o.  Subjective:   Patient ID:  Paige [name] is a 55 y.o. (DOB 03-12-1966) female.  Chief Complaint: No chief complaint on file.   HPI Paige [name] presents to the office today for follow-up of  GAD, MDD, PTSD, insomnia.  Describes mood today as "ok". Pleasant. Mood symptoms - reports decreased depression and irritability - "still there". Feels anxious at times - "not too bad or good either". Stating "I'm just hanging in there". Denies mania. Mood "level" - "more on the lower side ". She and mother doing projects together. Plannng to cat sit for her over the summer. Planning a retreat with church group. Talking to people on Facebook dating site. 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 Varying interest and motivation. Continues to see therapist Chiropractor) every other week for PTSD. Taking medications as prescribed.  Energy levels improved - "but not where it needs to be". Active, walking daily. Enjoys some usual interests and activities. Single. Dating. Lives alone with 2 cats - and one outside. Mother local and supportive. Appetite adequate. Weight stable - 210 pounds. Sleeps well most nights. Averages 4 to 7 hours. Denies daytime napping.  Focus and concentration "not good at all actually". Doing "some" things around the house. Disabled since 2009. Denies SI or HI.  Denies AH or VH.  Previous medication trials: Lexapro, Paxil, Trazadone, Wellbutrin SR, Geodon, Seroquel, Risperdal, Depakote, Topamax, Lithium, Provigil, Ambien, Cytomel, Clonazepam and other benzodixepaines, Elavil, Effexor, Remeron, Prozac, Anafranil, Tofranil, Zoloft, Geodon, Buspar, Adderall, Ritalin, Abilify, Rexulti, Lamictal, Latuda    Mini-Mental   Flowsheet Row Office Visit from 04/07/2018 in [facility]  Total Score (max 30 points ) 29    PHQ2-9   [place] Patient Outreach from 05/22/2018 in [place] [facility] Outreach from 05/13/2018 in [place] [facility] Out[facility] Telephone from 04/30/2018 in [facility]  PHQ-2 Total Score 5 5 2   PHQ-9 Total Score 20 18 13        Review of Systems:  Review of Systems  Musculoskeletal: Negative for gait problem.  Neurological: Negative for tremors.  Psychiatric/Behavioral:       Please refer to HPI    Medications: I have reviewed the patient's current medications.  Current Outpatient Medications  Medication Sig Dispense Refill  . buPROPion (WELLBUTRIN XL) 150 MG 24 hr tablet Take three tablets every morning. 90 tablet 5  . clonazePAM (KLONOPIN) 0.5 MG tablet Take 1 tablet (0.5 mg total) by mouth 2 (two) times daily as needed for anxiety. 30 tablet 2  . dicyclomine (BENTYL) 10 MG capsule Take 1 capsule (10 mg total) by mouth 3 (three) times daily as needed for spasms. 90 capsule 11  . DULoxetine (CYMBALTA) 60 MG capsule TAKE 1 CAPSULE TWICE DAILY. 60 capsule 5  . Erenumab-aooe (AIMOVIG) 70 MG/ML SOAJ Inject 70 mg into the skin every 28 (twenty-eight) days. 1.12 mL 11  . Fe Fum-Fe Poly-Vit C-Lactobac (FUSION) 65-65-25-30 MG CAPS Take by mouth.    . Ferrous Sulfate Dried (SLOW RELEASE IRON) 45 MG TBCR Take 1 tablet by mouth daily.    Marland Kitchen ipratropium (ATROVENT) 0.03 % nasal spray Place 2 sprays into both nostrils as needed.  30 mL 12  . ipratropium (ATROVENT) 0.06 % nasal spray INSTILL 2 SPRAYS INTO EACH NOSTRIL AS NEEDED TWICE A DAY    . ketoconazole (NIZORAL) 2 % shampoo Apply topically.    Marland Kitchen levothyroxine (SYNTHROID, LEVOTHROID)  88 MCG tablet Take 88 mcg by mouth daily before breakfast.    . methocarbamol (ROBAXIN) 500 MG tablet Take 500 mg by mouth 3 (three) times daily. (Patient not taking: Reported on 09/07/2020)    . nystatin cream (MYCOSTATIN) Apply 1 application topically daily as needed for dry skin.    Marland Kitchen omeprazole (PRILOSEC) 20 MG capsule     . ondansetron (ZOFRAN-ODT) 4 MG disintegrating tablet Take 4  mg by mouth every 8 (eight) hours as needed for nausea or vomiting.    . Oxycodone HCl 10 MG TABS Take 10 mg by mouth every 6 (six) hours as needed.    Vladimir Faster Glycol-Propyl Glycol (SYSTANE) 0.4-0.3 % SOLN Apply 1 drop to eye as needed.    . polyethylene glycol (MIRALAX / GLYCOLAX) 17 g packet Take 17 g by mouth 2 (two) times daily.    . potassium chloride (K-DUR,KLOR-CON) 10 MEQ tablet Take 1 tablet by mouth daily. (Patient not taking: Reported on 09/07/2020)    . potassium chloride (KLOR-CON) 10 MEQ tablet Take by mouth.    . promethazine (PHENERGAN) 12.5 MG tablet Take 1 tablet (12.5 mg total) by mouth every 8 (eight) hours as needed for nausea or vomiting. 30 tablet 0  . rizatriptan (MAXALT) 10 MG tablet rizatriptan 10 mg tablet  TAKE 1 TABLET AT EARLIEST ONSET OF MIGRAINE. MAY REPEAT ONCE IN 2 HOURS IF NEEDED (Patient not taking: Reported on 09/07/2020)    . rOPINIRole (REQUIP) 2 MG tablet Take 2 mg by mouth 2 (two) times daily.    Marland Kitchen terconazole (TERAZOL 3) 0.8 % vaginal cream Place 1 applicator vaginally at bedtime.    Marland Kitchen tiZANidine (ZANAFLEX) 2 MG tablet Take 2 mg by mouth 3 (three) times daily. (Patient not taking: Reported on 09/07/2020)    . torsemide (DEMADEX) 20 MG tablet Take 20 mg by mouth daily.     . traZODone (DESYREL) 100 MG tablet Take 2 tablets (200 mg total) by mouth at bedtime. 60 tablet 5  . Ubrogepant (UBRELVY) 100 MG TABS Take 1 tablet by mouth daily as needed (at onset of headaches may repeat dose after 2 hours as needed max 200 mg in 24 hours). 16 tablet 11   Current Facility-Administered Medications  Medication Dose Route Frequency Provider Last Rate Last Admin  . 0.9 %  sodium chloride infusion  500 mL Intravenous Once Ladene Artist, MD        Medication Side Effects: None  Allergies:  Allergies  Allergen Reactions  . Cyclobenzaprine Other (See Comments)    Other reaction(s): Unknown Hallucinations Unknown   . Meloxicam Other (See Comments)  .  Naltrexone Other (See Comments)    SEVERE PAIN ALL OVER  . Pork (Porcine) Protein Other (See Comments)    Other reaction(s): Other (See Comments) Migraines Other reaction(s): Other (See Comments) Migraines  . Sumatriptan Other (See Comments)    Difficulty breathing Other reaction(s): Other (see comments) Off balance   . Tape Dermatitis  . Flexeril [Cyclobenzaprine Hcl]   . Imitrex [Sumatriptan Base]   . Other   . Sulfa Antibiotics     Past Medical History:  Diagnosis Date  . Allergic rhinitis   . Anemia   . Anxiety disorder   . Chronic fatigue   . Chronic headaches   . Chronic pain   . Depression   . Endometriosis   . Family history of colon cancer    mother,uncles,aunts  . Fibromyalgia   . Gallstones   . GERD (gastroesophageal  reflux disease)   . Hypothyroidism   . IBS (irritable bowel syndrome)   . Insomnia   . Iron deficiency   . Laryngopharyngeal reflux (LPR)   . Obesity   . Personal history of colonic polyps 04/2006   polypoid  . PONV (postoperative nausea and vomiting)   . RLS (restless legs syndrome)     Past Medical History, Surgical history, Social history, and Family history were reviewed and updated as appropriate.   Please see review of systems for further details on the patient's review from today.   Objective:   Physical Exam:  There were no vitals taken for this visit.  Physical Exam Constitutional:      General: She is not in acute distress. Musculoskeletal:        General: No deformity.  Neurological:     Mental Status: She is alert and oriented to person, place, and time.     Coordination: Coordination normal.  Psychiatric:        Attention and Perception: Attention and perception normal. She does not perceive auditory or visual hallucinations.        Mood and Affect: Mood normal. Mood is not anxious or depressed. Affect is not labile, blunt, angry or inappropriate.        Speech: Speech normal.        Behavior: Behavior normal.         Thought Content: Thought content normal. Thought content is not paranoid or delusional. Thought content does not include homicidal or suicidal ideation. Thought content does not include homicidal or suicidal plan.        Cognition and Memory: Cognition and memory normal.        Judgment: Judgment normal.     Comments: Insight intact     Lab Review:     Component Value Date/Time   NA 140 05/26/2019 1046   K 3.4 (L) 05/26/2019 1046   CL 95 (L) 05/26/2019 1046   CO2 37 (H) 05/26/2019 1046   GLUCOSE 97 05/26/2019 1046   BUN 17 05/26/2019 1046   CREATININE 1.09 05/26/2019 1046   CALCIUM 9.3 05/26/2019 1046   PROT 6.9 05/26/2019 1046   ALBUMIN 4.2 05/26/2019 1046   AST 36 05/26/2019 1046   ALT 30 05/26/2019 1046   ALKPHOS 98 05/26/2019 1046   BILITOT 0.4 05/26/2019 1046       Component Value Date/Time   WBC 6.9 05/26/2019 1046   RBC 4.42 05/26/2019 1046   HGB 12.6 05/26/2019 1046   HCT 38.7 05/26/2019 1046   PLT 209.0 05/26/2019 1046   MCV 87.6 05/26/2019 1046   MCHC 32.5 05/26/2019 1046   RDW 14.6 05/26/2019 1046   LYMPHSABS 2.0 05/26/2019 1046   MONOABS 0.6 05/26/2019 1046   EOSABS 0.1 05/26/2019 1046   BASOSABS 0.0 05/26/2019 1046    No results found for: POCLITH, LITHIUM   No results found for: PHENYTOIN, PHENOBARB, VALPROATE, CBMZ   .res Assessment: Plan:     Plan:  1. Wellbutrin XL 450mg  daily - in the am  2. Cymbalta 60mg  BID 3. Trazadone 100mg  - 2 at hs  4. Vraylar 1.5mg  daily - samples given 5. Clonazepam 0.5mg  BID  Continue therapy with Bambi Cottle.   RTC 6 weeks  Patient advised to contact office with any questions, adverse effects, or acute worsening in signs and symptoms.  Discussed potential metabolic side effects associated with atypical antipsychotics, as well as potential risk for movement side effects. Advised pt to contact office if movement  side effects occur.    Diagnoses and all orders for this visit:  Insomnia, unspecified  type -     DULoxetine (CYMBALTA) 60 MG capsule; TAKE 1 CAPSULE TWICE DAILY. -     traZODone (DESYREL) 100 MG tablet; Take 2 tablets (200 mg total) by mouth at bedtime.  Major depressive disorder, recurrent episode, moderate (HCC) -     DULoxetine (CYMBALTA) 60 MG capsule; TAKE 1 CAPSULE TWICE DAILY. -     traZODone (DESYREL) 100 MG tablet; Take 2 tablets (200 mg total) by mouth at bedtime. -     buPROPion (WELLBUTRIN XL) 150 MG 24 hr tablet; Take three tablets every morning.  Generalized anxiety disorder -     DULoxetine (CYMBALTA) 60 MG capsule; TAKE 1 CAPSULE TWICE DAILY. -     buPROPion (WELLBUTRIN XL) 150 MG 24 hr tablet; Take three tablets every morning. -     clonazePAM (KLONOPIN) 0.5 MG tablet; Take 1 tablet (0.5 mg total) by mouth 2 (two) times daily as needed for anxiety.  PTSD (post-traumatic stress disorder) -     DULoxetine (CYMBALTA) 60 MG capsule; TAKE 1 CAPSULE TWICE DAILY.     Please see After Visit Summary for patient specific instructions.  Future Appointments  Date Time Provider [place]  05/30/2021  2:00 PM Cottle, Lucious Groves, LCSW [facility] None  09/10/2021  9:50 AM Tomi Likens, Adam R, DO [facility] None    No orders of the defined types were placed in this encounter.   -------------------------------

## 2021-05-07 DIAGNOSIS — R635 Abnormal weight gain: Secondary | ICD-10-CM | POA: Diagnosis not present

## 2021-05-08 NOTE — Progress Notes (Signed)
Krystie Schoenberg (KeyLorenda Peck) Rx #: [id number] Roselyn Meier 100MG  tablets   Form [employer] [place] Medicare Part D Electronic Request Form (CB) Created 19 days ago Sent to Plan 11 minutes ago Plan Response 11 minutes ago Submit Clinical Questions Determination Unknown 11 days ago Pending authorization  [employer] [id number].

## 2021-05-09 ENCOUNTER — Telehealth: Payer: Self-pay | Admitting: Neurology

## 2021-05-09 DIAGNOSIS — Z1331 Encounter for screening for depression: Secondary | ICD-10-CM | POA: Diagnosis not present

## 2021-05-09 DIAGNOSIS — E782 Mixed hyperlipidemia: Secondary | ICD-10-CM | POA: Diagnosis not present

## 2021-05-09 DIAGNOSIS — N951 Menopausal and female climacteric states: Secondary | ICD-10-CM | POA: Diagnosis not present

## 2021-05-09 DIAGNOSIS — Z1339 Encounter for screening examination for other mental health and behavioral disorders: Secondary | ICD-10-CM | POA: Diagnosis not present

## 2021-05-09 NOTE — Telephone Encounter (Signed)
Patient's pharmacy called requested an update on the prior authorization for Ubrelvy 100 MG.

## 2021-05-09 NOTE — Telephone Encounter (Signed)
Still pending at this time 05/09/2021 at 323

## 2021-05-11 ENCOUNTER — Other Ambulatory Visit: Payer: Self-pay

## 2021-05-11 ENCOUNTER — Telehealth: Payer: Self-pay

## 2021-05-11 ENCOUNTER — Telehealth: Payer: Self-pay | Admitting: Adult Health

## 2021-05-11 NOTE — Telephone Encounter (Signed)
Noted  

## 2021-05-11 NOTE — Progress Notes (Signed)
Paige [name] 100mg  denied.

## 2021-05-11 NOTE — Telephone Encounter (Signed)
Paige [name] has been approved for her Vraylar through pt. Assistance until 12/01/21.  She should receive her first shipment in 7-10 days.  I have notified the pt.  Please be sure the note/chart that she is getting this medication through Pt. Assistance.  No prescriptions should be sent to a local pharmacy.

## 2021-05-11 NOTE — Telephone Encounter (Signed)
[employer] blue shield denied Ubrelvy 100mg  #10. Fyi.

## 2021-05-11 NOTE — Telephone Encounter (Signed)
No answer at 1:08 05/11/2021

## 2021-05-14 NOTE — Telephone Encounter (Signed)
Per [employer] will not cover Hinda Kehr.  Please advise. Pt has health [employer].

## 2021-05-14 NOTE — Telephone Encounter (Signed)
I sent my chart message to contact office for Rx.

## 2021-05-14 NOTE — Telephone Encounter (Signed)
Pt called in and left a message returning Christy's call

## 2021-05-14 NOTE — Telephone Encounter (Signed)
No answer at 928 05/14/2021

## 2021-05-14 NOTE — Telephone Encounter (Signed)
Patient called back and stated she needs a new medication if she cant get Urbelvy. She needs something for rescue.

## 2021-05-15 NOTE — Telephone Encounter (Signed)
Estill Bamberg from [employer] called to get an update on the PA for [place].

## 2021-05-16 ENCOUNTER — Ambulatory Visit: Payer: Medicare Other | Admitting: Psychology

## 2021-05-17 DIAGNOSIS — E782 Mixed hyperlipidemia: Secondary | ICD-10-CM | POA: Diagnosis not present

## 2021-05-17 DIAGNOSIS — Z6833 Body mass index (BMI) 33.0-33.9, adult: Secondary | ICD-10-CM | POA: Diagnosis not present

## 2021-05-18 ENCOUNTER — Telehealth: Payer: Self-pay | Admitting: Neurology

## 2021-05-18 NOTE — Telephone Encounter (Signed)
Paige [name]  Key # [id number] Submitted an appeal through Cover my meds to electronically be faxed to [phone] via cover my meds   Your PA has been faxed to the plan as a paper copy. Please contact the plan directly if you haven't received a determination in a typical timeframe.  You will be notified of the determination electronically and via fax.  Could take up to 7 days for response

## 2021-05-21 ENCOUNTER — Telehealth: Payer: Self-pay | Admitting: Neurology

## 2021-05-21 NOTE — Progress Notes (Signed)
Assistance form filled out and faxed

## 2021-05-24 ENCOUNTER — Telehealth: Payer: Self-pay

## 2021-05-24 DIAGNOSIS — E782 Mixed hyperlipidemia: Secondary | ICD-10-CM | POA: Diagnosis not present

## 2021-05-24 DIAGNOSIS — Z6832 Body mass index (BMI) 32.0-32.9, adult: Secondary | ICD-10-CM | POA: Diagnosis not present

## 2021-05-24 NOTE — Telephone Encounter (Signed)
Telephone call from pt pharmacy benefits. The patient sent in an appeal for Urbelvy and it was approved.

## 2021-05-29 DIAGNOSIS — M797 Fibromyalgia: Secondary | ICD-10-CM | POA: Diagnosis not present

## 2021-05-29 DIAGNOSIS — G894 Chronic pain syndrome: Secondary | ICD-10-CM | POA: Diagnosis not present

## 2021-05-29 DIAGNOSIS — M791 Myalgia, unspecified site: Secondary | ICD-10-CM | POA: Diagnosis not present

## 2021-05-29 DIAGNOSIS — M4326 Fusion of spine, lumbar region: Secondary | ICD-10-CM | POA: Diagnosis not present

## 2021-05-30 ENCOUNTER — Ambulatory Visit (INDEPENDENT_AMBULATORY_CARE_PROVIDER_SITE_OTHER): Payer: Medicare Other | Admitting: Psychology

## 2021-05-30 DIAGNOSIS — M549 Dorsalgia, unspecified: Secondary | ICD-10-CM | POA: Diagnosis not present

## 2021-05-30 DIAGNOSIS — F4310 Post-traumatic stress disorder, unspecified: Secondary | ICD-10-CM | POA: Diagnosis not present

## 2021-05-30 DIAGNOSIS — E039 Hypothyroidism, unspecified: Secondary | ICD-10-CM | POA: Diagnosis not present

## 2021-05-30 DIAGNOSIS — Z6831 Body mass index (BMI) 31.0-31.9, adult: Secondary | ICD-10-CM | POA: Diagnosis not present

## 2021-05-30 DIAGNOSIS — E2839 Other primary ovarian failure: Secondary | ICD-10-CM | POA: Diagnosis not present

## 2021-05-31 DIAGNOSIS — Z6831 Body mass index (BMI) 31.0-31.9, adult: Secondary | ICD-10-CM | POA: Diagnosis not present

## 2021-05-31 DIAGNOSIS — E782 Mixed hyperlipidemia: Secondary | ICD-10-CM | POA: Diagnosis not present

## 2021-06-06 DIAGNOSIS — Z6831 Body mass index (BMI) 31.0-31.9, adult: Secondary | ICD-10-CM | POA: Diagnosis not present

## 2021-06-06 DIAGNOSIS — N951 Menopausal and female climacteric states: Secondary | ICD-10-CM | POA: Diagnosis not present

## 2021-06-06 DIAGNOSIS — E782 Mixed hyperlipidemia: Secondary | ICD-10-CM | POA: Diagnosis not present

## 2021-06-08 ENCOUNTER — Telehealth: Payer: Self-pay | Admitting: Adult Health

## 2021-06-08 NOTE — Telephone Encounter (Signed)
Please review

## 2021-06-08 NOTE — Telephone Encounter (Signed)
Next visit is 06/14/21. Paige [name] might be having a CT myelogram soon and they informed her she must be off her mental health meds 24 hrs before and after the scan. She states she is afraid to be off of them for this long and would like Regina's opinion on it? Her phone number is [phone].

## 2021-06-11 NOTE — Telephone Encounter (Signed)
Did they give any specific reason as to why she needs to be of the medication?

## 2021-06-11 NOTE — Telephone Encounter (Signed)
She may have some withdrawal symptoms but they will pass.

## 2021-06-11 NOTE — Telephone Encounter (Signed)
No they just stated it would interfere.Pt is now having second thoughts on getting the CT because she does not want to be off her meds that long.

## 2021-06-11 NOTE — Telephone Encounter (Signed)
Pt informed

## 2021-06-13 ENCOUNTER — Ambulatory Visit (INDEPENDENT_AMBULATORY_CARE_PROVIDER_SITE_OTHER): Payer: Medicare Other | Admitting: Psychology

## 2021-06-13 DIAGNOSIS — M546 Pain in thoracic spine: Secondary | ICD-10-CM | POA: Diagnosis not present

## 2021-06-13 DIAGNOSIS — M5124 Other intervertebral disc displacement, thoracic region: Secondary | ICD-10-CM | POA: Diagnosis not present

## 2021-06-13 DIAGNOSIS — M4804 Spinal stenosis, thoracic region: Secondary | ICD-10-CM | POA: Diagnosis not present

## 2021-06-13 DIAGNOSIS — M4314 Spondylolisthesis, thoracic region: Secondary | ICD-10-CM | POA: Diagnosis not present

## 2021-06-13 DIAGNOSIS — M47814 Spondylosis without myelopathy or radiculopathy, thoracic region: Secondary | ICD-10-CM | POA: Diagnosis not present

## 2021-06-13 DIAGNOSIS — F4310 Post-traumatic stress disorder, unspecified: Secondary | ICD-10-CM | POA: Diagnosis not present

## 2021-06-14 ENCOUNTER — Telehealth (INDEPENDENT_AMBULATORY_CARE_PROVIDER_SITE_OTHER): Payer: Medicare Other | Admitting: Adult Health

## 2021-06-14 ENCOUNTER — Encounter: Payer: Self-pay | Admitting: Adult Health

## 2021-06-14 ENCOUNTER — Telehealth: Payer: Self-pay | Admitting: Neurology

## 2021-06-14 DIAGNOSIS — F4310 Post-traumatic stress disorder, unspecified: Secondary | ICD-10-CM | POA: Diagnosis not present

## 2021-06-14 DIAGNOSIS — F39 Unspecified mood [affective] disorder: Secondary | ICD-10-CM | POA: Diagnosis not present

## 2021-06-14 DIAGNOSIS — G4700 Insomnia, unspecified: Secondary | ICD-10-CM

## 2021-06-14 DIAGNOSIS — F331 Major depressive disorder, recurrent, moderate: Secondary | ICD-10-CM | POA: Diagnosis not present

## 2021-06-14 DIAGNOSIS — F411 Generalized anxiety disorder: Secondary | ICD-10-CM | POA: Diagnosis not present

## 2021-06-14 NOTE — Telephone Encounter (Signed)
Pt called in stating the patient assistance for Ubrelvy needs Dr. Tomi Likens to send detailed directions about the Paige [name] for them to move forward.

## 2021-06-14 NOTE — Progress Notes (Signed)
Paige [name] [id number] [birth year] 55 y.o.  Virtual Visit via Telephone Note  I connected with pt on 06/14/21 at  9:00 AM EDT by telephone and verified that I am speaking with the correct person using two identifiers.   I discussed the limitations, risks, security and privacy concerns of performing an evaluation and management service by telephone and the availability of in person appointments. I also discussed with the patient that there may be a patient responsible charge related to this service. The patient expressed understanding and agreed to proceed.   I discussed the assessment and treatment plan with the patient. The patient was provided an opportunity to ask questions and all were answered. The patient agreed with the plan and demonstrated an understanding of the instructions.   The patient was advised to call back or seek an in-person evaluation if the symptoms worsen or if the condition fails to improve as anticipated.  I provided 30 minutes of non-face-to-face time during this encounter.  The patient was located at home.  The provider was located at [facility].   Aloha Gell, NP   Subjective:   Patient ID:  Paige [name] is a 55 y.o. (DOB December 14, 1965) female.  Chief Complaint: No chief complaint on file.   HPI CASHMERE DINGLEY presents for follow-up of  GAD, MDD, PTSD, insomnia.  Describes mood today as "ok". Pleasant. Mood symptoms - reports decreased depression - "level" , anxiety "ok", and irritability - "at times". Denies mania. Mood "pretty stable". Stating "I'm doing alright". Recovering from recent testing - spinal myelogram. Cousin recently visited from [place] - had not seen him in 30 years. Spending time with mother - staying with her currently while she recovers. Upcoming retreat with church group.  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 Varying interest and motivation. Continues to see therapist Midwife) every other week for PTSD. Taking medications as prescribed.  Energy levels improved. Active, walking daily. Enjoys some usual interests and activities. Single. Not dating currently. Lives alone with 2 cats - "Marcello Moores and Rome". Mother local and supportive. Appetite adequate. Weight loss - 205 pounds. Sleeps well most nights. Averages 4 to 7 hours. Focus and concentration "not great". Doing a few things around the house. Disabled since 2009. Denies SI or HI.  Denies AH or VH.  Previous medication trials: Lexapro, Paxil, Trazadone, Wellbutrin SR, Geodon, Seroquel, Risperdal, Depakote, Topamax, Lithium, Provigil, Ambien, Cytomel, Clonazepam and other benzodixepaines, Elavil, Effexor, Remeron, Prozac, Anafranil, Tofranil, Zoloft, Geodon, Buspar, Adderall, Ritalin, Abilify, Rexulti, Lamictal, Latuda   Review of Systems:  Review of Systems  Musculoskeletal:  Negative for gait problem.  Neurological:  Negative for tremors.  Psychiatric/Behavioral:         Please refer to HPI   Medications: I have reviewed the patient's current medications.  Current Outpatient Medications  Medication Sig Dispense Refill   buPROPion (WELLBUTRIN XL) 150 MG 24 hr tablet Take three tablets every morning. 90 tablet 5   clonazePAM (KLONOPIN) 0.5 MG tablet Take 1 tablet (0.5 mg total) by mouth 2 (two) times daily as needed for anxiety. 30 tablet 2   dicyclomine (BENTYL) 10 MG capsule Take 1 capsule (10 mg total) by mouth 3 (three) times daily as needed for spasms. 90 capsule 11   DULoxetine (CYMBALTA) 60 MG capsule TAKE 1 CAPSULE TWICE DAILY. 60 capsule 5   Erenumab-aooe (AIMOVIG) 70 MG/ML SOAJ Inject 70 mg into the skin every 28 (twenty-eight) days. 1.12 mL  11   Fe Fum-Fe Poly-Vit C-Lactobac (FUSION) 65-65-25-30 MG CAPS Take by mouth.     Ferrous Sulfate Dried (SLOW RELEASE IRON) 45 MG TBCR Take 1 tablet by mouth daily.     ipratropium (ATROVENT) 0.03 % nasal spray Place 2 sprays into both nostrils as  needed.  30 mL 12   ipratropium (ATROVENT) 0.06 % nasal spray INSTILL 2 SPRAYS INTO EACH NOSTRIL AS NEEDED TWICE A DAY     ketoconazole (NIZORAL) 2 % shampoo Apply topically.     levothyroxine (SYNTHROID, LEVOTHROID) 88 MCG tablet Take 88 mcg by mouth daily before breakfast.     methocarbamol (ROBAXIN) 500 MG tablet Take 500 mg by mouth 3 (three) times daily. (Patient not taking: Reported on 09/07/2020)     nystatin cream (MYCOSTATIN) Apply 1 application topically daily as needed for dry skin.     omeprazole (PRILOSEC) 20 MG capsule      ondansetron (ZOFRAN-ODT) 4 MG disintegrating tablet Take 4 mg by mouth every 8 (eight) hours as needed for nausea or vomiting.     Oxycodone HCl 10 MG TABS Take 10 mg by mouth every 6 (six) hours as needed.     Polyethyl Glycol-Propyl Glycol (SYSTANE) 0.4-0.3 % SOLN Apply 1 drop to eye as needed.     polyethylene glycol (MIRALAX / GLYCOLAX) 17 g packet Take 17 g by mouth 2 (two) times daily.     potassium chloride (K-DUR,KLOR-CON) 10 MEQ tablet Take 1 tablet by mouth daily. (Patient not taking: Reported on 09/07/2020)     potassium chloride (KLOR-CON) 10 MEQ tablet Take by mouth.     promethazine (PHENERGAN) 12.5 MG tablet Take 1 tablet (12.5 mg total) by mouth every 8 (eight) hours as needed for nausea or vomiting. 30 tablet 0   rizatriptan (MAXALT) 10 MG tablet rizatriptan 10 mg tablet  TAKE 1 TABLET AT EARLIEST ONSET OF MIGRAINE. MAY REPEAT ONCE IN 2 HOURS IF NEEDED (Patient not taking: Reported on 09/07/2020)     rOPINIRole (REQUIP) 2 MG tablet Take 2 mg by mouth 2 (two) times daily.     terconazole (TERAZOL 3) 0.8 % vaginal cream Place 1 applicator vaginally at bedtime.     tiZANidine (ZANAFLEX) 2 MG tablet Take 2 mg by mouth 3 (three) times daily. (Patient not taking: Reported on 09/07/2020)     torsemide (DEMADEX) 20 MG tablet Take 20 mg by mouth daily.      traZODone (DESYREL) 100 MG tablet Take 2 tablets (200 mg total) by mouth at bedtime. 60 tablet 5    Ubrogepant (UBRELVY) 100 MG TABS Take 1 tablet by mouth daily as needed (at onset of headaches may repeat dose after 2 hours as needed max 200 mg in 24 hours). 16 tablet 11   Current Facility-Administered Medications  Medication Dose Route Frequency Provider Last Rate Last Admin   0.9 %  sodium chloride infusion  500 mL Intravenous Once Ladene Artist, MD        Medication Side Effects: None  Allergies:  Allergies  Allergen Reactions   Cyclobenzaprine Other (See Comments)    Other reaction(s): Unknown Hallucinations Unknown    Meloxicam Other (See Comments)   Naltrexone Other (See Comments)    SEVERE PAIN ALL OVER   Pork (Porcine) Protein Other (See Comments)    Other reaction(s): Other (See Comments) Migraines Other reaction(s): Other (See Comments) Migraines   Sumatriptan Other (See Comments)    Difficulty breathing Other reaction(s): Other (see comments) Off balance  Tape Dermatitis   Flexeril [Cyclobenzaprine Hcl]    Imitrex [Sumatriptan Base]    Other    Sulfa Antibiotics     Past Medical History:  Diagnosis Date   Allergic rhinitis    Anemia    Anxiety disorder    Chronic fatigue    Chronic headaches    Chronic pain    Depression    Endometriosis    Family history of colon cancer    mother,uncles,aunts   Fibromyalgia    Gallstones    GERD (gastroesophageal reflux disease)    Hypothyroidism    IBS (irritable bowel syndrome)    Insomnia    Iron deficiency    Laryngopharyngeal reflux (LPR)    Obesity    Personal history of colonic polyps 04/2006   polypoid   PONV (postoperative nausea and vomiting)    RLS (restless legs syndrome)     Family History  Problem Relation Age of Onset   Colon cancer Mother        uncles,aunts   Diabetes Father        paternal grandmother,sister   Leukemia Father    Lymphoma Father    Skin cancer Father    Heart disease Father    Diabetes Sister    Stomach cancer Neg Hx    Esophageal cancer Neg Hx    Rectal  cancer Neg Hx     Social History   Socioeconomic History   Marital status: Single    Spouse name: Not on file   Number of children: 0   Years of education: Not on file   Highest education level: Not on file  Occupational History   Occupation: RN/disabled  Tobacco Use   Smoking status: Never   Smokeless tobacco: Never  Vaping Use   Vaping Use: Never used  Substance and Sexual Activity   Alcohol use: Not Currently    Alcohol/week: 0.0 standard drinks    Comment: rarely   Drug use: No   Sexual activity: Not on file  Other Topics Concern   Not on file  Social History Narrative   Patient is left-handed. She lives alone in a one level home. She has 3 cats. She is unable to exercise. Caffeine one soda / day. On disability now. 2 bachelor degrees   Social Determinants of Health   Financial Resource Strain: Not on file  Food Insecurity: Not on file  Transportation Needs: Not on file  Physical Activity: Not on file  Stress: Not on file  Social Connections: Not on file  Intimate Partner Violence: Not on file    Past Medical History, Surgical history, Social history, and Family history were reviewed and updated as appropriate.   Please see review of systems for further details on the patient's review from today.   Objective:   Physical Exam:  There were no vitals taken for this visit.  Physical Exam Neurological:     Mental Status: She is alert and oriented to person, place, and time.     Cranial Nerves: No dysarthria.  Psychiatric:        Attention and Perception: Attention and perception normal.        Mood and Affect: Mood normal.        Speech: Speech normal.        Behavior: Behavior is cooperative.        Thought Content: Thought content normal. Thought content is not paranoid or delusional. Thought content does not include homicidal or suicidal ideation. Thought content does  not include homicidal or suicidal plan.        Cognition and Memory: Cognition and memory  normal.        Judgment: Judgment normal.     Comments: Insight intact    Lab Review:     Component Value Date/Time   NA 140 05/26/2019 1046   K 3.4 (L) 05/26/2019 1046   CL 95 (L) 05/26/2019 1046   CO2 37 (H) 05/26/2019 1046   GLUCOSE 97 05/26/2019 1046   BUN 17 05/26/2019 1046   CREATININE 1.09 05/26/2019 1046   CALCIUM 9.3 05/26/2019 1046   PROT 6.9 05/26/2019 1046   ALBUMIN 4.2 05/26/2019 1046   AST 36 05/26/2019 1046   ALT 30 05/26/2019 1046   ALKPHOS 98 05/26/2019 1046   BILITOT 0.4 05/26/2019 1046       Component Value Date/Time   WBC 6.9 05/26/2019 1046   RBC 4.42 05/26/2019 1046   HGB 12.6 05/26/2019 1046   HCT 38.7 05/26/2019 1046   PLT 209.0 05/26/2019 1046   MCV 87.6 05/26/2019 1046   MCHC 32.5 05/26/2019 1046   RDW 14.6 05/26/2019 1046   LYMPHSABS 2.0 05/26/2019 1046   MONOABS 0.6 05/26/2019 1046   EOSABS 0.1 05/26/2019 1046   BASOSABS 0.0 05/26/2019 1046    No results found for: POCLITH, LITHIUM   No results found for: PHENYTOIN, PHENOBARB, VALPROATE, CBMZ   .res Assessment: Plan:    Plan:  1. Wellbutrin XL 450mg  daily - in the am  2. Cymbalta 60mg  BID 3. Trazadone 100mg  - 2 at hs  4. Vraylar 1.5mg  daily  5. Clonazepam 0.5mg  BID  Continue therapy with Bambi Cottle.   RTC 6 weeks  Patient advised to contact office with any questions, adverse effects, or acute worsening in signs and symptoms.  Discussed potential metabolic side effects associated with atypical antipsychotics, as well as potential risk for movement side effects. Advised pt to contact office if movement side effects occur.   Diagnoses and all orders for this visit:  Episodic mood disorder (HCC)  PTSD (post-traumatic stress disorder)  Generalized anxiety disorder  Major depressive disorder, recurrent episode, moderate (HCC)  Insomnia, unspecified type   Please see After Visit Summary for patient specific instructions.  Future Appointments  Date Time Provider  [place]  06/27/2021  2:00 PM Cottle, Lucious Groves, LCSW [facility] None  09/10/2021  9:50 AM Tomi Likens, Adam R, DO [facility] None    No orders of the defined types were placed in this encounter.     -------------------------------

## 2021-06-15 MED ORDER — UBRELVY 100 MG PO TABS: 1.0000 | ORAL_TABLET | Freq: Every day | ORAL | 11 refills | Status: DC | PRN

## 2021-06-15 NOTE — Telephone Encounter (Signed)
Print Paige [name] script and faxed to [facility] [phone]

## 2021-06-18 DIAGNOSIS — Z6831 Body mass index (BMI) 31.0-31.9, adult: Secondary | ICD-10-CM | POA: Diagnosis not present

## 2021-06-18 DIAGNOSIS — E782 Mixed hyperlipidemia: Secondary | ICD-10-CM | POA: Diagnosis not present

## 2021-06-18 DIAGNOSIS — N951 Menopausal and female climacteric states: Secondary | ICD-10-CM | POA: Diagnosis not present

## 2021-06-19 DIAGNOSIS — G894 Chronic pain syndrome: Secondary | ICD-10-CM | POA: Diagnosis not present

## 2021-06-19 DIAGNOSIS — M797 Fibromyalgia: Secondary | ICD-10-CM | POA: Diagnosis not present

## 2021-06-19 DIAGNOSIS — M47894 Other spondylosis, thoracic region: Secondary | ICD-10-CM | POA: Diagnosis not present

## 2021-06-19 DIAGNOSIS — M4326 Fusion of spine, lumbar region: Secondary | ICD-10-CM | POA: Diagnosis not present

## 2021-06-27 ENCOUNTER — Ambulatory Visit (INDEPENDENT_AMBULATORY_CARE_PROVIDER_SITE_OTHER): Payer: Medicare Other | Admitting: Psychology

## 2021-06-27 DIAGNOSIS — F4310 Post-traumatic stress disorder, unspecified: Secondary | ICD-10-CM | POA: Diagnosis not present

## 2021-07-04 DIAGNOSIS — E2839 Other primary ovarian failure: Secondary | ICD-10-CM | POA: Diagnosis not present

## 2021-07-04 DIAGNOSIS — Z13820 Encounter for screening for osteoporosis: Secondary | ICD-10-CM | POA: Diagnosis not present

## 2021-07-05 DIAGNOSIS — E039 Hypothyroidism, unspecified: Secondary | ICD-10-CM | POA: Diagnosis not present

## 2021-07-11 ENCOUNTER — Ambulatory Visit (INDEPENDENT_AMBULATORY_CARE_PROVIDER_SITE_OTHER): Payer: Medicare Other | Admitting: Psychology

## 2021-07-11 DIAGNOSIS — F4310 Post-traumatic stress disorder, unspecified: Secondary | ICD-10-CM | POA: Diagnosis not present

## 2021-07-25 ENCOUNTER — Ambulatory Visit (INDEPENDENT_AMBULATORY_CARE_PROVIDER_SITE_OTHER): Payer: Medicare Other | Admitting: Psychology

## 2021-07-25 DIAGNOSIS — F4310 Post-traumatic stress disorder, unspecified: Secondary | ICD-10-CM | POA: Diagnosis not present

## 2021-07-26 ENCOUNTER — Encounter: Payer: Self-pay | Admitting: Gastroenterology

## 2021-08-01 DIAGNOSIS — E785 Hyperlipidemia, unspecified: Secondary | ICD-10-CM | POA: Diagnosis not present

## 2021-08-01 DIAGNOSIS — G43709 Chronic migraine without aura, not intractable, without status migrainosus: Secondary | ICD-10-CM | POA: Diagnosis not present

## 2021-08-01 DIAGNOSIS — E039 Hypothyroidism, unspecified: Secondary | ICD-10-CM | POA: Diagnosis not present

## 2021-08-07 DIAGNOSIS — L7211 Pilar cyst: Secondary | ICD-10-CM | POA: Diagnosis not present

## 2021-08-07 DIAGNOSIS — D2271 Melanocytic nevi of right lower limb, including hip: Secondary | ICD-10-CM | POA: Diagnosis not present

## 2021-08-08 ENCOUNTER — Ambulatory Visit (INDEPENDENT_AMBULATORY_CARE_PROVIDER_SITE_OTHER): Payer: Medicare Other | Admitting: Psychology

## 2021-08-08 DIAGNOSIS — F4310 Post-traumatic stress disorder, unspecified: Secondary | ICD-10-CM

## 2021-08-10 NOTE — Telephone Encounter (Signed)
error 

## 2021-08-11 DIAGNOSIS — R3989 Other symptoms and signs involving the genitourinary system: Secondary | ICD-10-CM | POA: Diagnosis not present

## 2021-08-20 DIAGNOSIS — N951 Menopausal and female climacteric states: Secondary | ICD-10-CM | POA: Diagnosis not present

## 2021-08-22 ENCOUNTER — Ambulatory Visit (INDEPENDENT_AMBULATORY_CARE_PROVIDER_SITE_OTHER): Payer: Medicare Other | Admitting: Psychology

## 2021-08-22 DIAGNOSIS — R232 Flushing: Secondary | ICD-10-CM | POA: Diagnosis not present

## 2021-08-22 DIAGNOSIS — N898 Other specified noninflammatory disorders of vagina: Secondary | ICD-10-CM | POA: Diagnosis not present

## 2021-08-22 DIAGNOSIS — F4310 Post-traumatic stress disorder, unspecified: Secondary | ICD-10-CM | POA: Diagnosis not present

## 2021-08-22 DIAGNOSIS — Z6833 Body mass index (BMI) 33.0-33.9, adult: Secondary | ICD-10-CM | POA: Diagnosis not present

## 2021-08-22 DIAGNOSIS — N951 Menopausal and female climacteric states: Secondary | ICD-10-CM | POA: Diagnosis not present

## 2021-08-29 DIAGNOSIS — Z6833 Body mass index (BMI) 33.0-33.9, adult: Secondary | ICD-10-CM | POA: Diagnosis not present

## 2021-08-29 DIAGNOSIS — E782 Mixed hyperlipidemia: Secondary | ICD-10-CM | POA: Diagnosis not present

## 2021-09-03 ENCOUNTER — Other Ambulatory Visit: Payer: Self-pay | Admitting: Neurology

## 2021-09-04 DIAGNOSIS — E785 Hyperlipidemia, unspecified: Secondary | ICD-10-CM | POA: Diagnosis not present

## 2021-09-04 DIAGNOSIS — R102 Pelvic and perineal pain: Secondary | ICD-10-CM | POA: Diagnosis not present

## 2021-09-04 DIAGNOSIS — E039 Hypothyroidism, unspecified: Secondary | ICD-10-CM | POA: Diagnosis not present

## 2021-09-05 ENCOUNTER — Ambulatory Visit (INDEPENDENT_AMBULATORY_CARE_PROVIDER_SITE_OTHER): Payer: Medicare Other | Admitting: Psychology

## 2021-09-05 DIAGNOSIS — F4310 Post-traumatic stress disorder, unspecified: Secondary | ICD-10-CM

## 2021-09-06 DIAGNOSIS — E782 Mixed hyperlipidemia: Secondary | ICD-10-CM | POA: Diagnosis not present

## 2021-09-06 DIAGNOSIS — Z6833 Body mass index (BMI) 33.0-33.9, adult: Secondary | ICD-10-CM | POA: Diagnosis not present

## 2021-09-06 NOTE — Progress Notes (Signed)
NEUROLOGY FOLLOW UP OFFICE NOTE  SAVANNAHA STONEROCK [id number]  Assessment/Plan:   Migraine without aura, without status migrainosus, not intractable Chronic pain syndrome History of cauda equina syndrome secondary to lumbar spinal stenosis at L4-5 s/p laminectomy and fusion  Migraine prevention:  Aimovig 70mg  every 28 days.  Due to cost, she will contact her insurance and inquire about cost of Emgality or [employer] Migraine rescue:  Roselyn Meier 100mg  Limit use of pain relievers to no more than 2 days out of week to prevent risk of rebound or medication-overuse headache. Keep headache diary Follow up one year   Subjective:  Ceceilia Cephus is a 55 year old right-handed female with hypothyroidism, restless leg syndrome and depression/PTSD who follows up for migraines and neuropathy.   UPDATE: Migraine: Unfortunately, her insurance stopped covering Bandera, so she was switched to eletriptan in June.  Denial was appealed and [place] subsequently approved.  Aimovig effective but expensive.  Does not qualify for the copay card as she has Medicare. Intensity:  moderate Duration:  30-60 minutes Frequency:  1 to 2 days a month (last week prior to injection) Frequency of abortive medication: 1 to 2 times a month  Current NSAIDS: None Current analgesics: None Current triptans: None Current ergotamine: None Current anti-emetic: Zofran ODT 4 mg Current muscle relaxants: Baclofen 10 mg Current anti-anxiolytic: Lajuan Lines 1 mg Current sleep aide: Trazodone 100 mg Current Antihypertensive medications: None Current Antidepressant medications: Cymbalta 60 mg, Wellbutrin XR 150 mg Current Anticonvulsant medications: Gabapentin 800mg  twice daily Current anti-CGRP: Aimovig 70mg , Ubrelvy 100mg  Current Vitamins/Herbal/Supplements: B complex, C, co-Q10 Current Antihistamines/Decongestants: None Other therapy:  none Other medication:  Requip     HISTORY: Migraine: Onset:  Since teenager.  She  has migraines as well as daily headache which have gotten worse over the past 2 years. Location:  Migraines: bi-frontal/retro-orbital/temples; Daily headache:  Bi-frontal/temporal Quality:  Migraine: pounding; Daily headache: non-throbbing Initial intensity:  Migraine: 10/10; Daily headache: 6/10 Aura:  no Prodrome:  no Associated symptoms:  Migraine: nausea, photophobia, phonophobia, osmophobia, sometimes vomiting; Daily headache: sometimes photophobia Initial Duration:  Migraine:  Several hours; Daily headache: constant Initial Frequency:  Migraine: every other week; Daily headache:  constant Triggers:  Certain scents, bright light, pork Relieving factors:  Rest Activity:  Cannot function with migraine   Past NSAIDS:  Ibuprofen, naproxen Past analgesics:  Percocet (hives), morphine (itching) Past abortive triptans:  sumatriptan (difficulty breathing), rizatriptan, eletriptan Past muscle relaxants:  cyclobenzaprine (hallucinations) Past anti-emetic:  phenergan Past antihypertensive medications:  no Past antidepressant medications:  Amitriptyline, possibly venlafaxine Past anticonvulsant medications:  Depakote, topiramate 100mg  twice daily (side effect), zonisamide 50mg , lamotrigine Past anti-CGRP:  Ubrelvy 100mg  (effective but no longer covered by her insurance) Past vitamins/Herbal/Supplements:  no Other past therapy:  Botox, acupuncture   Family history of headache:  Paternal aunt   Chronic Pain/Lumbar spinal stenosis/Cauda equina Syndrome: Since early 2020, she reports numbness and tingling in the toes.  TSH from 05/26/19 was 1.72.  NCV-EMG from 07/15/19 was normal.  Labs from June 2020 demonstrated TSH 1.72.  Labs from October 2020 demonstrated ANA negative; sed rate 19; SPEP/IFE negative for M-spike; B12 401; B6 6.2.  Punch Skin Biopsy performed in November 2020 was negative for small fiber neuropathy but demonstrated some degenerative changes within epidermal nerves which are  non-specific and may be normal but also may be predictive of future development of clinical neuropathy.  She had inserted a spina stimulator in the right hip in December 2020.  She subsequently  developed cauda equina syndrome with severe pain radiating down both legs with new bilateral lower extremity tingling, saddle anesthesia and bowel and bladder dysfunction.  CT of lumbar spine showed severe spinal stenosis at L4-5 with bulging disc and progressed anteriolithesis, moderate to severe subarticular stenosis bilaterally, and levoscoliosis at T12-L1.  MRI unable to be performed due to spinal stimulator, so CT myelogram was performed, showing severe spinal stenosis at L4-5 with effacement of root bilaterally and severe compression of the canal.  She underwent L4-S1 laminectomy TLIF and PSF in July 2021.  She has improved bowel and bladder function.    History of Subjective Memory Deficits: She has noted short term memory problems since around 2017.  She frequently misplaces items around the house.  She does get distracted and will not get around to chores around the house.  She has been late paying bills on three occasions.  One time, she got disoriented driving on a familiar route.  She sometimes has trouble recalling names of familiar people.  She also has word-finding difficulty and problems with spelling.  She saw a speech pathologist who told her she has memory deficits.  Her thyroid panel was within normal range.   B12 level from 04/07/2018 was 560.  She underwent neuropsychological testing on 09/16/2018 which demonstrated deficits in verbal fluency, working memory, and mental flexibility as well as mildly below expectation performances in processing speed, learning and memory for information that is not repeated, and verbal abstract reasoning.  However, visual-spatial skills, basic attention, nonverbal abstract reasoning, confrontation naming, and memory for information that is repeated were well within  normal limits.  There was no evidence of cortical dysfunction or early onset Alzheimer's disease.  Her cognitive difficulties were suspected to be related to her severe depression, high level of pain, and insomnia. Her uncle and aunt on her mother's side had dementia.  PAST MEDICAL HISTORY: Past Medical History:  Diagnosis Date   Allergic rhinitis    Anemia    Anxiety disorder    Chronic fatigue    Chronic headaches    Chronic pain    Depression    Endometriosis    Family history of colon cancer    mother,uncles,aunts   Fibromyalgia    Gallstones    GERD (gastroesophageal reflux disease)    Hypothyroidism    IBS (irritable bowel syndrome)    Insomnia    Iron deficiency    Laryngopharyngeal reflux (LPR)    Obesity    Personal history of colonic polyps 04/2006   polypoid   PONV (postoperative nausea and vomiting)    RLS (restless legs syndrome)     MEDICATIONS: Current Outpatient Medications on File Prior to Visit  Medication Sig Dispense Refill   AIMOVIG 70 MG/ML SOAJ INJECT 70 MG INTO THE SKIN EVERY 28 DAYS. 1 mL 0   buPROPion (WELLBUTRIN XL) 150 MG 24 hr tablet Take three tablets every morning. 90 tablet 5   clonazePAM (KLONOPIN) 0.5 MG tablet Take 1 tablet (0.5 mg total) by mouth 2 (two) times daily as needed for anxiety. 30 tablet 2   dicyclomine (BENTYL) 10 MG capsule Take 1 capsule (10 mg total) by mouth 3 (three) times daily as needed for spasms. 90 capsule 11   DULoxetine (CYMBALTA) 60 MG capsule TAKE 1 CAPSULE TWICE DAILY. 60 capsule 5   Fe Fum-Fe Poly-Vit C-Lactobac (FUSION) 65-65-25-30 MG CAPS Take by mouth.     Ferrous Sulfate Dried (SLOW RELEASE IRON) 45 MG TBCR Take 1 tablet by mouth  daily.     ipratropium (ATROVENT) 0.03 % nasal spray Place 2 sprays into both nostrils as needed.  30 mL 12   ipratropium (ATROVENT) 0.06 % nasal spray INSTILL 2 SPRAYS INTO EACH NOSTRIL AS NEEDED TWICE A DAY     ketoconazole (NIZORAL) 2 % shampoo Apply topically.     levothyroxine  (SYNTHROID, LEVOTHROID) 88 MCG tablet Take 88 mcg by mouth daily before breakfast.     methocarbamol (ROBAXIN) 500 MG tablet Take 500 mg by mouth 3 (three) times daily. (Patient not taking: Reported on 09/07/2020)     nystatin cream (MYCOSTATIN) Apply 1 application topically daily as needed for dry skin.     omeprazole (PRILOSEC) 20 MG capsule      ondansetron (ZOFRAN-ODT) 4 MG disintegrating tablet Take 4 mg by mouth every 8 (eight) hours as needed for nausea or vomiting.     Oxycodone HCl 10 MG TABS Take 10 mg by mouth every 6 (six) hours as needed.     Polyethyl Glycol-Propyl Glycol (SYSTANE) 0.4-0.3 % SOLN Apply 1 drop to eye as needed.     polyethylene glycol (MIRALAX / GLYCOLAX) 17 g packet Take 17 g by mouth 2 (two) times daily.     potassium chloride (K-DUR,KLOR-CON) 10 MEQ tablet Take 1 tablet by mouth daily. (Patient not taking: Reported on 09/07/2020)     potassium chloride (KLOR-CON) 10 MEQ tablet Take by mouth.     promethazine (PHENERGAN) 12.5 MG tablet Take 1 tablet (12.5 mg total) by mouth every 8 (eight) hours as needed for nausea or vomiting. 30 tablet 0   rizatriptan (MAXALT) 10 MG tablet rizatriptan 10 mg tablet  TAKE 1 TABLET AT EARLIEST ONSET OF MIGRAINE. MAY REPEAT ONCE IN 2 HOURS IF NEEDED (Patient not taking: Reported on 09/07/2020)     rOPINIRole (REQUIP) 2 MG tablet Take 2 mg by mouth 2 (two) times daily.     terconazole (TERAZOL 3) 0.8 % vaginal cream Place 1 applicator vaginally at bedtime.     tiZANidine (ZANAFLEX) 2 MG tablet Take 2 mg by mouth 3 (three) times daily. (Patient not taking: Reported on 09/07/2020)     torsemide (DEMADEX) 20 MG tablet Take 20 mg by mouth daily.      traZODone (DESYREL) 100 MG tablet Take 2 tablets (200 mg total) by mouth at bedtime. 60 tablet 5   Ubrogepant (UBRELVY) 100 MG TABS Take 1 tablet by mouth daily as needed (at onset of headaches may repeat dose after 2 hours as needed max 200 mg in 24 hours). 16 tablet 11   Current  Facility-Administered Medications on File Prior to Visit  Medication Dose Route Frequency Provider Last Rate Last Admin   0.9 %  sodium chloride infusion  500 mL Intravenous Once Ladene Artist, MD        ALLERGIES: Allergies  Allergen Reactions   Cyclobenzaprine Other (See Comments)    Other reaction(s): Unknown Hallucinations Unknown    Meloxicam Other (See Comments)   Naltrexone Other (See Comments)    SEVERE PAIN ALL OVER   Pork (Porcine) Protein Other (See Comments)    Other reaction(s): Other (See Comments) Migraines Other reaction(s): Other (See Comments) Migraines   Sumatriptan Other (See Comments)    Difficulty breathing Other reaction(s): Other (see comments) Off balance    Tape Dermatitis   Flexeril [Cyclobenzaprine Hcl]    Imitrex [Sumatriptan Base]    Other    Sulfa Antibiotics     FAMILY HISTORY: Family History  Problem Relation  Age of Onset   Colon cancer Mother        uncles,aunts   Diabetes Father        paternal grandmother,sister   Leukemia Father    Lymphoma Father    Skin cancer Father    Heart disease Father    Diabetes Sister    Stomach cancer Neg Hx    Esophageal cancer Neg Hx    Rectal cancer Neg Hx       Objective:  Blood pressure 103/71, pulse 80, height 5\' 8"  (1.727 m), weight 225 lb 9.6 oz (102.3 kg), SpO2 98 %. General: No acute distress.  Patient appears well-groomed.   Head:  Normocephalic/atraumatic Eyes:  Fundi examined but not visualized Neck: supple, no paraspinal tenderness, full range of motion Heart:  Regular rate and rhythm Lungs:  Clear to auscultation bilaterally Back: No paraspinal tenderness Neurological Exam: alert and oriented to person, place, and time.  Speech fluent and not dysarthric, language intact.  CN II-XII intact. Bulk and tone normal, muscle strength 5/5 throughout.  Sensation to light touch intact.  Deep tendon reflexes 2+ throughout.  Finger to nose testing intact.  Gait normal, Romberg  negative.   Metta Clines, DO  CC: Ernestene Kiel, MD

## 2021-09-10 ENCOUNTER — Ambulatory Visit (INDEPENDENT_AMBULATORY_CARE_PROVIDER_SITE_OTHER): Payer: Medicare Other | Admitting: Neurology

## 2021-09-10 ENCOUNTER — Other Ambulatory Visit: Payer: Self-pay

## 2021-09-10 ENCOUNTER — Encounter: Payer: Self-pay | Admitting: Neurology

## 2021-09-10 VITALS — BP 103/71 | HR 80 | Ht 68.0 in | Wt 225.6 lb

## 2021-09-10 DIAGNOSIS — G894 Chronic pain syndrome: Secondary | ICD-10-CM | POA: Diagnosis not present

## 2021-09-10 DIAGNOSIS — G43009 Migraine without aura, not intractable, without status migrainosus: Secondary | ICD-10-CM | POA: Diagnosis not present

## 2021-09-10 MED ORDER — AIMOVIG 70 MG/ML ~~LOC~~ SOAJ: SUBCUTANEOUS | 11 refills | Status: DC

## 2021-09-10 NOTE — Patient Instructions (Signed)
Aimovig 70mg  every 28 days.  Inquire about Emgality and [employer] as needed/directed

## 2021-09-13 DIAGNOSIS — E782 Mixed hyperlipidemia: Secondary | ICD-10-CM | POA: Diagnosis not present

## 2021-09-13 DIAGNOSIS — Z6833 Body mass index (BMI) 33.0-33.9, adult: Secondary | ICD-10-CM | POA: Diagnosis not present

## 2021-09-19 ENCOUNTER — Ambulatory Visit: Payer: Medicare Other | Admitting: Psychology

## 2021-09-20 ENCOUNTER — Other Ambulatory Visit: Payer: Self-pay

## 2021-09-20 ENCOUNTER — Ambulatory Visit (AMBULATORY_SURGERY_CENTER): Payer: Medicare Other | Admitting: *Deleted

## 2021-09-20 VITALS — Ht 68.0 in | Wt 220.0 lb

## 2021-09-20 DIAGNOSIS — Z800 Family history of malignant neoplasm of digestive organs: Secondary | ICD-10-CM

## 2021-09-20 DIAGNOSIS — Z86010 Personal history of colonic polyps: Secondary | ICD-10-CM

## 2021-09-20 MED ORDER — PEG 3350-KCL-NA BICARB-NACL 420 G PO SOLR: 4000.0000 mL | Freq: Once | ORAL | 0 refills | Status: AC

## 2021-09-20 NOTE — Progress Notes (Signed)
No egg or soy allergy known to patient  issues known to pt with past sedation with any surgeries or procedures- PONV  Patient denies ever being told they had issues or difficulty with intubation  No FH of Malignant Hyperthermia Pt is not on diet pills Pt is not on  home 02  Pt is not on blood thinners  Pt denies issues with constipation - takes Miralax BID- [facility] constipation of she doesn't take Rubin Payor as directed - last colon poor prep- 2 day prep for colon 11-3  No A fib or A flutter  Pt is fully vaccinated  for Covid NO PA's for preps discussed with pt In PV today  Discussed with pt there will be an out-of-pocket cost for prep and that varies from $0 to 70 +  dollars - pt verbalized understanding   Due to the COVID-19 pandemic we are asking patients to follow certain guidelines in PV and the [place]   Pt aware of COVID protocols and [facility] guidelines   Pt verified name, DOB, address and insurance during PV today.  Pt mailed instruction packet of Emmi video, copy of consent form to read and not return, and instructions.  PV completed over the phone.  Pt encouraged to call with questions or issues.  My Chart instructions to pt as well

## 2021-09-21 ENCOUNTER — Encounter: Payer: Self-pay | Admitting: Gastroenterology

## 2021-10-03 ENCOUNTER — Ambulatory Visit (INDEPENDENT_AMBULATORY_CARE_PROVIDER_SITE_OTHER): Payer: Medicare Other | Admitting: Psychology

## 2021-10-03 DIAGNOSIS — F4310 Post-traumatic stress disorder, unspecified: Secondary | ICD-10-CM

## 2021-10-04 ENCOUNTER — Ambulatory Visit (AMBULATORY_SURGERY_CENTER): Payer: Medicare Other | Admitting: Gastroenterology

## 2021-10-04 ENCOUNTER — Encounter: Payer: Self-pay | Admitting: Gastroenterology

## 2021-10-04 VITALS — BP 100/50 | HR 66 | Temp 97.3°F | Resp 17 | Ht 68.0 in | Wt 220.0 lb

## 2021-10-04 DIAGNOSIS — Z800 Family history of malignant neoplasm of digestive organs: Secondary | ICD-10-CM | POA: Diagnosis not present

## 2021-10-04 DIAGNOSIS — Z86010 Personal history of colonic polyps: Secondary | ICD-10-CM

## 2021-10-04 DIAGNOSIS — D123 Benign neoplasm of transverse colon: Secondary | ICD-10-CM

## 2021-10-04 DIAGNOSIS — D122 Benign neoplasm of ascending colon: Secondary | ICD-10-CM

## 2021-10-04 DIAGNOSIS — K635 Polyp of colon: Secondary | ICD-10-CM | POA: Diagnosis not present

## 2021-10-04 DIAGNOSIS — D125 Benign neoplasm of sigmoid colon: Secondary | ICD-10-CM

## 2021-10-04 MED ORDER — SODIUM CHLORIDE 0.9 % IV SOLN: 500.0000 mL | Freq: Once | INTRAVENOUS | Status: DC

## 2021-10-04 NOTE — Progress Notes (Signed)
Called to room to assist during endoscopic procedure.  Patient ID and intended procedure confirmed with present staff. Received instructions for my participation in the procedure from the performing physician.  

## 2021-10-04 NOTE — Progress Notes (Signed)
Pt's states no medical or surgical changes since previsit or office visit. VS assessed by C.W 

## 2021-10-04 NOTE — Op Note (Signed)
[facility] Patient Name: Paige [name] Procedure Date: 10/04/2021 10:38 AM MRN: [id number] Endoscopist: Ladene Artist , MD Age: 55 Referring MD:  Date of Birth: [birth year] Gender: Female Account #: [id number] Procedure:                Colonoscopy Indications:              Surveillance: Personal history of adenomatous                            polyps on last colonoscopy 5 years ago. Family                            history of colon cancer, first degree relative and                            multiple second degree relatives. Medicines:                Monitored Anesthesia Care Procedure:                Pre-Anesthesia Assessment:                           - Prior to the procedure, a History and Physical                            was performed, and patient medications and                            allergies were reviewed. The patient's tolerance of                            previous anesthesia was also reviewed. The risks                            and benefits of the procedure and the sedation                            options and risks were discussed with the patient.                            All questions were answered, and informed consent                            was obtained. Prior Anticoagulants: The patient has                            taken no previous anticoagulant or antiplatelet                            agents. ASA Grade Assessment: II - A patient with                            mild systemic disease. After reviewing the risks  and benefits, the patient was deemed in                            satisfactory condition to undergo the procedure.                           After obtaining informed consent, the colonoscope                            was passed under direct vision. Throughout the                            procedure, the patient's blood pressure, pulse, and                            oxygen saturations were  monitored continuously. The                            Olympus CF-HQ190L (Serial# [id number]) Colonoscope was                            introduced through the anus and advanced to the the                            cecum, identified by appendiceal orifice and                            ileocecal valve. The ileocecal valve, appendiceal                            orifice, and rectum were photographed. The quality                            of the bowel preparation was adequate after                            extensive lavage and suction. The colonoscopy was                            performed without difficulty. The patient tolerated                            the procedure well. Scope In: 10:51:27 AM Scope Out: 11:11:35 AM Scope Withdrawal Time: 0 hours 15 minutes 37 seconds  Total Procedure Duration: 0 hours 20 minutes 8 seconds  Findings:                 The perianal and digital rectal examinations were                            normal.                           Four sessile polyps were found in the sigmoid colon                            (  1), transverse colon (2) and ascending colon (1).                            The polyps were 7 to 10 mm in size. These polyps                            were removed with a cold snare. Resection and                            retrieval were complete.                           There was a small lipoma, 10 mm in diameter, in the                            transverse colon.                           The exam was otherwise without abnormality on                            direct and retroflexion views. Complications:            No immediate complications. Estimated blood loss:                            None. Estimated Blood Loss:     Estimated blood loss: none. Impression:               - Four 7 to 10 mm polyps in the sigmoid colon, in                            the transverse colon and in the ascending colon,                            removed with a  cold snare. Resected and retrieved.                           - Small lipoma in transverse colon.                           - The examination was otherwise normal on direct                            and retroflexion views. Recommendation:           - Repeat colonoscopy after studies are complete for                            surveillance based on pathology results.                           - Patient has a contact number available for  emergencies. The signs and symptoms of potential                            delayed complications were discussed with the                            patient. Return to normal activities tomorrow.                            Written discharge instructions were provided to the                            patient.                           - Resume previous diet.                           - Continue present medications.                           - Await pathology results. Ladene Artist, MD 10/04/2021 11:21:03 AM This report has been signed electronically.

## 2021-10-04 NOTE — Progress Notes (Signed)
See 09/10/2021 H&P, no changes.

## 2021-10-04 NOTE — Progress Notes (Signed)
To PACU, VSS. Report to [email or web address] 

## 2021-10-04 NOTE — Patient Instructions (Signed)
Handouts given on polyps  YOU HAD AN ENDOSCOPIC PROCEDURE TODAY AT [facility]:   Refer to the procedure report that was given to you for any specific questions about what was found during the examination.  If the procedure report does not answer your questions, please call your gastroenterologist to clarify.  If you requested that your care partner not be given the details of your procedure findings, then the procedure report has been included in a sealed envelope for you to review at your convenience later.  YOU SHOULD EXPECT: Some feelings of bloating in the abdomen. Passage of more gas than usual.  Walking can help get rid of the air that was put into your GI tract during the procedure and reduce the bloating. If you had a lower endoscopy (such as a colonoscopy or flexible sigmoidoscopy) you may notice spotting of blood in your stool or on the toilet paper. If you underwent a bowel prep for your procedure, you may not have a normal bowel movement for a few days.  Please Note:  You might notice some irritation and congestion in your nose or some drainage.  This is from the oxygen used during your procedure.  There is no need for concern and it should clear up in a day or so.  SYMPTOMS TO REPORT IMMEDIATELY:  Following lower endoscopy (colonoscopy or flexible sigmoidoscopy):  Excessive amounts of blood in the stool  Significant tenderness or worsening of abdominal pains  Swelling of the abdomen that is new, acute  Fever of 100F or higher  For urgent or emergent issues, a gastroenterologist can be reached at any hour by calling [phone]. Do not use MyChart messaging for urgent concerns.    DIET:  We do recommend a small meal at first, but then you may proceed to your regular diet.  Drink plenty of fluids but you should avoid alcoholic beverages for 24 hours.  ACTIVITY:  You should plan to take it easy for the rest of today and you should NOT DRIVE or use heavy machinery  until tomorrow (because of the sedation medicines used during the test).    FOLLOW UP: Our staff will call the number listed on your records 48-72 hours following your procedure to check on you and address any questions or concerns that you may have regarding the information given to you following your procedure. If we do not reach you, we will leave a message.  We will attempt to reach you two times.  During this call, we will ask if you have developed any symptoms of COVID 19. If you develop any symptoms (ie: fever, flu-like symptoms, shortness of breath, cough etc.) before then, please call [phone].  If you test positive for Covid 19 in the 2 weeks post procedure, please call and report this information to [place].    If any biopsies were taken you will be contacted by phone or by letter within the next 1-3 weeks.  Please call us at [phone] if you have not heard about the biopsies in 3 weeks.    SIGNATURES/CONFIDENTIALITY: You and/or your care partner have signed paperwork which will be entered into your electronic medical record.  These signatures attest to the fact that that the information above on your After Visit Summary has been reviewed and is understood.  Full responsibility of the confidentiality of this discharge information lies with you and/or your care-partner.

## 2021-10-08 ENCOUNTER — Telehealth: Payer: Self-pay

## 2021-10-08 NOTE — Telephone Encounter (Signed)
  Follow up Call-  Call back number 10/04/2021 04/08/2019 03/25/2019  Post procedure Call Back phone  # [phone]  Permission to leave phone message Yes Yes Yes  Some recent data might be hidden     Patient questions:  Do you have a fever, pain , or abdominal swelling? No. Pain Score  0 *  Have you tolerated food without any problems? Yes.    Have you been able to return to your normal activities? Yes.    Do you have any questions about your discharge instructions: Diet   No. Medications  No. Follow up visit  No.  Do you have questions or concerns about your Care? No.  Actions: * If pain score is 4 or above: No action needed, pain <4.  Have you developed a fever since your procedure? no  2.   Have you had an respiratory symptoms (SOB or cough) since your procedure? no  3.   Have you tested positive for COVID 19 since your procedure no  4.   Have you had any family members/close contacts diagnosed with the COVID 19 since your procedure?  no   If yes to any of these questions please route to Joylene John, RN and Joella Prince, RN

## 2021-10-15 ENCOUNTER — Encounter: Payer: Self-pay | Admitting: Gastroenterology

## 2021-10-17 ENCOUNTER — Ambulatory Visit (INDEPENDENT_AMBULATORY_CARE_PROVIDER_SITE_OTHER): Payer: Medicare Other | Admitting: Psychology

## 2021-10-17 DIAGNOSIS — F4310 Post-traumatic stress disorder, unspecified: Secondary | ICD-10-CM | POA: Diagnosis not present

## 2021-10-31 ENCOUNTER — Ambulatory Visit (INDEPENDENT_AMBULATORY_CARE_PROVIDER_SITE_OTHER): Payer: Medicare Other | Admitting: Psychology

## 2021-10-31 DIAGNOSIS — F4310 Post-traumatic stress disorder, unspecified: Secondary | ICD-10-CM

## 2021-11-14 ENCOUNTER — Ambulatory Visit (INDEPENDENT_AMBULATORY_CARE_PROVIDER_SITE_OTHER): Payer: Medicare Other | Admitting: Psychology

## 2021-11-14 DIAGNOSIS — F4310 Post-traumatic stress disorder, unspecified: Secondary | ICD-10-CM | POA: Diagnosis not present

## 2021-11-14 NOTE — Progress Notes (Signed)
[facility] Counselor/Therapist Progress Note  Patient ID: Paige [name], MRN: [id number],    Date: 11/14/2021  Time Spent: 60 minutes  Treatment Type: Individual Therapy  Reported Symptoms: depression and worry  Mental Status Exam: Appearance:  Casual     Behavior: Appropriate  Motor: Normal  Speech/Language:  Normal Rate  Affect: Depressed  Mood: depressed  Thought process: normal  Thought content:   WNL  Sensory/Perceptual disturbances:   WNL  Orientation: oriented to person, place, time/date, and situation  Attention: Good  Concentration: Good  Memory: WNL  Fund of knowledge:  Good  Insight:   Good  Judgment:  Fair  Impulse Control: Fair   Risk Assessment: Danger to Self:  No Self-injurious Behavior: No Danger to Others: No Duty to Warn:no Physical Aggression / Violence:No  Access to Firearms a concern: No  Gang Involvement:No   Subjective: The patient attended a face-to-face individual therapy session via video visit due to COVID-19.  The patient gave verbal consent for this session to be on video on WebEx.  The patient was in her home alone and therapist was in the office alone.  The patient presents with a blunted affect and mood is depressed.  The patient reports that she started seeing someone else but she feels like there is something that is not quite right about the situation.  We processed her feelings and talked about some of the things that they have been talking about and it seems that he may be having some control issues.  The patient is still talking with a married man whom she had a threesome with his wife and she is having difficulty giving that up.  We discussed what she is looking for in life and I encouraged her to think about the decisions she makes in regards to relationships because of her past trauma.  Patient understood the concepts discussed.  Interventions: Cognitive Behavioral Therapy  Diagnosis:No diagnosis  found.  Plan: Treatment Plan  Strengths/Abilities:  Intelligent, kind, motivated  Treatment Preferences:  Outpatient Individual therapy  Statement of Needs:  "I need some help with getting back out into the world and learn how to interact and be social"   Symptoms:  Describes a reliving of the event, particularly through dissociative flashbacks.:  (Status: improved). Displays a significant decline in interest and engagement in activities.: (Status: improved). Displays significant psychological and/or physiological  distress resulting from internal and external clues that are reminiscent of the traumatic event.:  (Status: improved). Experiences disturbances in sleep.:   (Status: improved). Experiences disturbing and persistent thoughts, images, and/or perceptions of the  traumatic event.: (Status: improved). Experiences frequent nightmares.:  (Status: improved). Has been exposed to a traumatic event involving actual or  perceived threat of death or serious injury.: (Status: maintained).  Hypervigilance (e.g., feeling constantly on edge, experiencing concentration difficulties, having trouble falling or staying asleep, exhibiting a general state of irritability).: (Status:  improved). Impairment in social, occupational, or other areas of functioning.:  (Status: improved). Intentionally avoids activities, places, people, or objects (e.g., up-armored vehicles) that evoke memories of the event.:  (Status: improved). Intentionally avoids  thoughts, feelings, or discussions related to the traumatic event.: (Status:  improved). Reports difficulty concentrating as well as feelings of guilt.:   (Status: improved). Reports response of intense fear, helplessness, or horror to the traumatic event.: (Status: improved). Symptoms present more than one month.:  (Status: maintained).    Problems Addressed:  Anxiety, Posttraumatic Stress Disorder (PTSD),  Goals:  LTG:  1. Enhance  ability to effectively  cope with the full variety of life's worries  and anxieties. 2. No longer avoids persons, places, activities, and objects that are  reminiscent of the traumatic event. 3. No longer experiences intrusive event recollections, avoidance of event  reminders, intense arousal, or disinterest in activities or  relationships. 4. Stabilize anxiety level while increasing ability to function on a daily  basis. 5. Thinks about or openly discusses the traumatic event with others  without experiencing psychological or physiological distress.  STG: 1.Identify and engage in pleasant activities on a daily basis.Marland Kitchen   2.Identify, challenge, and replace biased, fearful self-talk with positive, realistic, and empowering self talk 3.  Participate in Cognitive Therapy to help identify, challenge, and replace biased, negative, and selfdefeating thoughts resulting from the trauma. 4.  Participate in Eye Movement Desensitization and Reprocessing (EMDR) to reduce emotional distress  related to traumatic thoughts, feelings, and images.  Target Date:  02/16/2022 Frequency: Biweekly Modality:Individual Therapy Interventions by Therapist:  CBT, EMDR, insight oriented therapy, problem solving  Lynn Sissel G Brodrick Curran, LCSW        I

## 2021-11-22 ENCOUNTER — Other Ambulatory Visit: Payer: Self-pay

## 2021-11-22 ENCOUNTER — Other Ambulatory Visit: Payer: Self-pay | Admitting: Adult Health

## 2021-11-22 DIAGNOSIS — F4310 Post-traumatic stress disorder, unspecified: Secondary | ICD-10-CM

## 2021-11-22 DIAGNOSIS — F411 Generalized anxiety disorder: Secondary | ICD-10-CM

## 2021-11-22 DIAGNOSIS — F331 Major depressive disorder, recurrent, moderate: Secondary | ICD-10-CM

## 2021-11-22 DIAGNOSIS — G4700 Insomnia, unspecified: Secondary | ICD-10-CM

## 2021-11-22 MED ORDER — DULOXETINE HCL 60 MG PO CPEP: ORAL_CAPSULE | ORAL | 0 refills | Status: DC

## 2021-11-22 MED ORDER — TRAZODONE HCL 100 MG PO TABS: 200.0000 mg | ORAL_TABLET | Freq: Every day | ORAL | 0 refills | Status: DC

## 2021-11-23 ENCOUNTER — Telehealth: Payer: Self-pay

## 2021-11-23 MED ORDER — ONDANSETRON 4 MG PO TBDP
4.0000 mg | ORAL_TABLET | Freq: Three times a day (TID) | ORAL | 6 refills | Status: DC | PRN
Start: 2021-11-23 — End: 2023-09-10

## 2021-11-23 NOTE — Telephone Encounter (Signed)
Ok to refill, thanks

## 2021-11-23 NOTE — Telephone Encounter (Signed)
Fax refill request received from [facility] for Zofran 4 mg Disintegrating tablet.  Please advise

## 2021-11-28 ENCOUNTER — Ambulatory Visit (INDEPENDENT_AMBULATORY_CARE_PROVIDER_SITE_OTHER): Payer: Medicare Other | Admitting: Psychology

## 2021-11-28 DIAGNOSIS — F4310 Post-traumatic stress disorder, unspecified: Secondary | ICD-10-CM | POA: Diagnosis not present

## 2021-11-28 NOTE — Progress Notes (Signed)
[facility] Counselor/Therapist Progress Note  Patient ID: JULIENNE VOGLER, MRN: [id number],    Date: 11/28/2021  Time Spent: 60 minutes  Treatment Type: Individual Therapy  Reported Symptoms: depression and worry  Mental Status Exam: Appearance:  Casual     Behavior: Appropriate  Motor: Normal  Speech/Language:  Normal Rate  Affect: Depressed  Mood: depressed  Thought process: normal  Thought content:   WNL  Sensory/Perceptual disturbances:   WNL  Orientation: oriented to person, place, time/date, and situation  Attention: Good  Concentration: Good  Memory: WNL  Fund of knowledge:  Good  Insight:   Good  Judgment:  Fair  Impulse Control: Fair   Risk Assessment: Danger to Self:  No Self-injurious Behavior: No Danger to Others: No Duty to Warn:no Physical Aggression / Violence:No  Access to Firearms a concern: No  Gang Involvement:No   Subjective: The patient attended a face-to-face individual therapy session via video visit due to COVID-19.  The patient gave verbal consent for this session to be on video on WebEx.  The patient was in her home alone and therapist was in the office alone.  The patient presents as pleasant and cooperative.  We processed how she is feeling and she is doing well.  We discussed her new relationship and this one seems healthier than previous relationships that she has been involved in.  We discussed her progress and she seems to be doing much better and living in the present and is not as reactive to the past anymore.  This is good progress with trauma treatment.  The patient is going to every three weeks after our next session.  Interventions: Cognitive Behavioral Therapy  Diagnosis:PTSD (post-traumatic stress disorder)  Plan: Treatment Plan  Strengths/Abilities:  Intelligent, kind, motivated  Treatment Preferences:  Outpatient Individual therapy  Statement of Needs:  "I need some help with getting back out into the  world and learn how to interact and be social"   Symptoms:  Describes a reliving of the event, particularly through dissociative flashbacks.:  (Status: improved). Displays a significant decline in interest and engagement in activities.: (Status: improved). Displays significant psychological and/or physiological  distress resulting from internal and external clues that are reminiscent of the traumatic event.:  (Status: improved). Experiences disturbances in sleep.:   (Status: improved). Experiences disturbing and persistent thoughts, images, and/or perceptions of the  traumatic event.: (Status: improved). Experiences frequent nightmares.:  (Status: improved). Has been exposed to a traumatic event involving actual or  perceived threat of death or serious injury.: (Status: maintained).  Hypervigilance (e.g., feeling constantly on edge, experiencing concentration difficulties, having trouble falling or staying asleep, exhibiting a general state of irritability).: (Status:  improved). Impairment in social, occupational, or other areas of functioning.:  (Status: improved). Intentionally avoids activities, places, people, or objects (e.g., up-armored vehicles) that evoke memories of the event.:  (Status: improved). Intentionally avoids  thoughts, feelings, or discussions related to the traumatic event.: (Status:  improved). Reports difficulty concentrating as well as feelings of guilt.:   (Status: improved). Reports response of intense fear, helplessness, or horror to the traumatic event.: (Status: improved). Symptoms present more than one month.:  (Status: maintained).    Problems Addressed:  Anxiety, Posttraumatic Stress Disorder (PTSD),  Goals:  LTG:  1. Enhance ability to effectively cope with the full variety of life's worries  and anxieties.  60% 2. No longer avoids persons, places, activities, and objects that are  reminiscent of the traumatic event.  60% 3. No longer experiences  intrusive  event recollections, avoidance of event  reminders, intense arousal, or disinterest in activities or  relationships.  60% 4. Stabilize anxiety level while increasing ability to function on a daily  basis.  70% 5. Thinks about or openly discusses the traumatic event with others  without experiencing psychological or physiological distress.  70%  STG: 1.Identify and engage in pleasant activities on a daily basis..  70% 2.Identify, challenge, and replace biased, fearful self-talk with positive, realistic, and empowering self talk 3.  Participate in Cognitive Therapy to help identify, challenge, and replace biased, negative, and selfdefeating thoughts resulting from the trauma.  70 % 4.  Participate in Eye Movement Desensitization and Reprocessing (EMDR) to reduce emotional distress  related to traumatic thoughts, feelings, and images.  80%  Target Date:  02/16/2022 Frequency: Biweekly Modality:Individual Therapy Interventions by Therapist:  CBT, EMDR, insight oriented therapy, problem solving  Patient approved treatment plan and is progressing nicely.  Dyllin Gulley G Pookela Sellin, LCSW        I

## 2021-11-30 ENCOUNTER — Other Ambulatory Visit: Payer: Self-pay

## 2021-11-30 DIAGNOSIS — F411 Generalized anxiety disorder: Secondary | ICD-10-CM

## 2021-11-30 MED ORDER — CLONAZEPAM 0.5 MG PO TABS: 0.5000 mg | ORAL_TABLET | Freq: Two times a day (BID) | ORAL | 2 refills | Status: DC | PRN

## 2021-12-04 ENCOUNTER — Ambulatory Visit: Payer: Medicare HMO | Admitting: Adult Health

## 2021-12-04 ENCOUNTER — Encounter: Payer: Self-pay | Admitting: Adult Health

## 2021-12-04 ENCOUNTER — Other Ambulatory Visit: Payer: Self-pay

## 2021-12-04 DIAGNOSIS — F4310 Post-traumatic stress disorder, unspecified: Secondary | ICD-10-CM

## 2021-12-04 DIAGNOSIS — F39 Unspecified mood [affective] disorder: Secondary | ICD-10-CM | POA: Diagnosis not present

## 2021-12-04 DIAGNOSIS — F411 Generalized anxiety disorder: Secondary | ICD-10-CM

## 2021-12-04 DIAGNOSIS — G4700 Insomnia, unspecified: Secondary | ICD-10-CM

## 2021-12-04 DIAGNOSIS — F331 Major depressive disorder, recurrent, moderate: Secondary | ICD-10-CM | POA: Diagnosis not present

## 2021-12-04 MED ORDER — TRAZODONE HCL 100 MG PO TABS: 200.0000 mg | ORAL_TABLET | Freq: Every day | ORAL | 3 refills | Status: DC

## 2021-12-04 MED ORDER — DULOXETINE HCL 60 MG PO CPEP: ORAL_CAPSULE | ORAL | 3 refills | Status: DC

## 2021-12-04 MED ORDER — CARIPRAZINE HCL 1.5 MG PO CAPS: 1.5000 mg | ORAL_CAPSULE | Freq: Every day | ORAL | 3 refills | Status: DC

## 2021-12-04 MED ORDER — BUPROPION HCL ER (XL) 150 MG PO TB24: ORAL_TABLET | ORAL | 3 refills | Status: DC

## 2021-12-04 MED ORDER — CLONAZEPAM 0.5 MG PO TABS: 0.5000 mg | ORAL_TABLET | Freq: Two times a day (BID) | ORAL | 2 refills | Status: DC | PRN

## 2021-12-04 NOTE — Progress Notes (Signed)
BRINDLEY MADARANG [id number] 24-Nov-1966 56 y.o.  Subjective:   Patient ID:  CONSEPCION UTT is a 56 y.o. (DOB 03/12/1966) female.  Chief Complaint: No chief complaint on file.   HPI DUSTI TETRO presents to the office today for follow-up of GAD, MDD, PTSD, insomnia.  Describes mood today as "ok". Pleasant. Tearful at times. Mood symptoms - reports depression, anxiety, and irritability at times. Denies mania. Moods are consistent. Stating "I'm doing ok". Feels like medications continue to work well. Varying interest and motivation. Continues to see therapist Chiropractor) every other week for PTSD. Taking medications as prescribed.  Energy levels improved. Active, walking daily - averages - 3,000 steps a day. Enjoys some usual interests and activities. Dating. Has a boyfriend. Lives alone with 2 cats - "Marcello Moores and Oakwood". Mother local and supportive. Appetite adequate. Weight gain - 205 to 220 pounds. Sleeps better some nights than others. Averages 4 to 7 hours. Focus and concentration "not great, but not horrible". Completing household tasks. Disabled since 2009. Denies SI or HI.  Denies AH or VH.  Previous medication trials: Lexapro, Paxil, Trazadone, Wellbutrin SR, Geodon, Seroquel, Risperdal, Depakote, Topamax, Lithium, Provigil, Ambien, Cytomel, Clonazepam and other benzodixepaines, Elavil, Effexor, Remeron, Prozac, Anafranil, Tofranil, Zoloft, Geodon, Buspar, Adderall, Ritalin, Abilify, Rexulti, Lamictal, Latuda     Mini-Mental    Flowsheet Row Office Visit from 04/07/2018 in [facility]  Total Score (max 30 points ) 29      PHQ2-9    [facility] Patient Outreach from 05/22/2018 in [facility] from 05/13/2018 in [facility] Telephone from 04/30/2018 in [facility]  PHQ-2 Total Score 5 5 2   PHQ-9 Total Score 20 18 13         Review of Systems:  Review of Systems   Musculoskeletal:  Negative for gait problem.  Neurological:  Negative for tremors.  Psychiatric/Behavioral:         Please refer to HPI   Medications: I have reviewed the patient's current medications.  Current Outpatient Medications  Medication Sig Dispense Refill   buPROPion (WELLBUTRIN XL) 150 MG 24 hr tablet TAKE 3 TABLETS EVERY MORNING. 270 tablet 3   cariprazine (VRAYLAR) 1.5 MG capsule Take 1 capsule (1.5 mg total) by mouth daily. 90 capsule 3   cetirizine (ZYRTEC) 10 MG tablet Take by mouth. (Patient not taking: Reported on 10/04/2021)     clonazePAM (KLONOPIN) 0.5 MG tablet Take 1 tablet (0.5 mg total) by mouth 2 (two) times daily as needed for anxiety. 30 tablet 2   dicyclomine (BENTYL) 10 MG capsule Take 1 capsule (10 mg total) by mouth 3 (three) times daily as needed for spasms. (Patient not taking: Reported on 10/04/2021) 90 capsule 11   DOTTI 0.0375 MG/24HR Place onto the skin.     DULoxetine (CYMBALTA) 60 MG capsule TAKE 1 CAPSULE TWICE DAILY. 180 capsule 3   Erenumab-aooe (AIMOVIG) 70 MG/ML SOAJ INJECT 70 MG INTO THE SKIN EVERY 28 DAYS. 1 mL 11   Fe Fum-Fe Poly-Vit C-Lactobac (FUSION) 65-65-25-30 MG CAPS Take by mouth. (Patient not taking: Reported on 10/04/2021)     ipratropium (ATROVENT) 0.06 % nasal spray INSTILL 2 SPRAYS INTO EACH NOSTRIL AS NEEDED TWICE A DAY (Patient not taking: Reported on 10/04/2021)     levothyroxine (SYNTHROID, LEVOTHROID) 88 MCG tablet Take 88 mcg by mouth daily before breakfast.     liothyronine (CYTOMEL) 5 MCG tablet Take 5 mcg by mouth daily. Monday Wednesday Friday  nystatin cream (MYCOSTATIN) Apply 1 application topically daily as needed for dry skin. (Patient not taking: Reported on 10/04/2021)     olopatadine (PATANOL) 0.1 % ophthalmic solution Apply to eye. (Patient not taking: Reported on 10/04/2021)     omeprazole (PRILOSEC) 20 MG capsule      ondansetron (ZOFRAN-ODT) 4 MG disintegrating tablet Take 1 tablet (4 mg total) by mouth every 8  (eight) hours as needed for nausea or vomiting. 20 tablet 6   Oxycodone HCl 10 MG TABS Take 10 mg by mouth every 6 (six) hours as needed.     Polyethyl Glycol-Propyl Glycol 0.4-0.3 % SOLN Apply 1 drop to eye as needed. (Patient not taking: No sig reported)     polyethylene glycol (MIRALAX / GLYCOLAX) 17 g packet Take 17 g by mouth 2 (two) times daily. (Patient not taking: Reported on 10/04/2021)     potassium chloride (K-DUR,KLOR-CON) 10 MEQ tablet Take 1 tablet by mouth daily.     progesterone (PROMETRIUM) 200 MG capsule Take 200 mg by mouth at bedtime.     promethazine (PHENERGAN) 12.5 MG tablet Take 1 tablet (12.5 mg total) by mouth every 8 (eight) hours as needed for nausea or vomiting. (Patient not taking: Reported on 10/04/2021) 30 tablet 0   rOPINIRole (REQUIP) 2 MG tablet Take 2 mg by mouth 2 (two) times daily.     terconazole (TERAZOL 3) 0.8 % vaginal cream Place 1 applicator vaginally at bedtime. (Patient not taking: Reported on 10/04/2021)     tiZANidine (ZANAFLEX) 2 MG tablet Take 2 mg by mouth 3 (three) times daily. (Patient not taking: Reported on 10/04/2021)     torsemide (DEMADEX) 20 MG tablet Take 20 mg by mouth daily.      traZODone (DESYREL) 100 MG tablet Take 2 tablets (200 mg total) by mouth at bedtime. 180 tablet 3   Ubrogepant (UBRELVY) 100 MG TABS Take 1 tablet by mouth daily as needed (at onset of headaches may repeat dose after 2 hours as needed max 200 mg in 24 hours). (Patient not taking: Reported on 10/04/2021) 16 tablet 11   Current Facility-Administered Medications  Medication Dose Route Frequency Provider Last Rate Last Admin   0.9 %  sodium chloride infusion  500 mL Intravenous Once Ladene Artist, MD        Medication Side Effects: None  Allergies:  Allergies  Allergen Reactions   Cyclobenzaprine Other (See Comments)    Other reaction(s): Unknown Hallucinations Unknown    Meloxicam Other (See Comments)   Naltrexone Other (See Comments)    SEVERE PAIN ALL  OVER   Pork (Porcine) Protein Other (See Comments)    Other reaction(s): Other (See Comments) Migraines Other reaction(s): Other (See Comments) Migraines   Sumatriptan Other (See Comments)    Difficulty breathing Other reaction(s): Other (see comments) Off balance    Tape Dermatitis   Flexeril [Cyclobenzaprine Hcl]    Imitrex [Sumatriptan Base]    Other    Sulfa Antibiotics     Pt states she does not think she is allergic to Sulfa     Past Medical History:  Diagnosis Date   Allergic rhinitis    Allergy    Anemia    Anxiety disorder    Cancer (HCC)    skin   Chronic fatigue    Chronic headaches    Chronic pain    Depression    Endometriosis    Family history of colon cancer    mother,uncles,aunts   Fibromyalgia  Gallstones    GERD (gastroesophageal reflux disease)    Hypothyroidism    IBS (irritable bowel syndrome)    Insomnia    Iron deficiency    Laryngopharyngeal reflux (LPR)    Obesity    Personal history of colonic polyps 04/2006   polypoid   PONV (postoperative nausea and vomiting)    RLS (restless legs syndrome)    Sleep apnea    no cpap    Past Medical History, Surgical history, Social history, and Family history were reviewed and updated as appropriate.   Please see review of systems for further details on the patient's review from today.   Objective:   Physical Exam:  There were no vitals taken for this visit.  Physical Exam Constitutional:      General: She is not in acute distress. Musculoskeletal:        General: No deformity.  Neurological:     Mental Status: She is alert and oriented to person, place, and time.     Coordination: Coordination normal.  Psychiatric:        Attention and Perception: Attention and perception normal. She does not perceive auditory or visual hallucinations.        Mood and Affect: Mood normal. Mood is not anxious or depressed. Affect is not labile, blunt, angry or inappropriate.        Speech: Speech  normal.        Behavior: Behavior normal.        Thought Content: Thought content normal. Thought content is not paranoid or delusional. Thought content does not include homicidal or suicidal ideation. Thought content does not include homicidal or suicidal plan.        Cognition and Memory: Cognition and memory normal.        Judgment: Judgment normal.     Comments: Insight intact    Lab Review:     Component Value Date/Time   NA 140 05/26/2019 1046   K 3.4 (L) 05/26/2019 1046   CL 95 (L) 05/26/2019 1046   CO2 37 (H) 05/26/2019 1046   GLUCOSE 97 05/26/2019 1046   BUN 17 05/26/2019 1046   CREATININE 1.09 05/26/2019 1046   CALCIUM 9.3 05/26/2019 1046   PROT 6.9 05/26/2019 1046   ALBUMIN 4.2 05/26/2019 1046   AST 36 05/26/2019 1046   ALT 30 05/26/2019 1046   ALKPHOS 98 05/26/2019 1046   BILITOT 0.4 05/26/2019 1046       Component Value Date/Time   WBC 6.9 05/26/2019 1046   RBC 4.42 05/26/2019 1046   HGB 12.6 05/26/2019 1046   HCT 38.7 05/26/2019 1046   PLT 209.0 05/26/2019 1046   MCV 87.6 05/26/2019 1046   MCHC 32.5 05/26/2019 1046   RDW 14.6 05/26/2019 1046   LYMPHSABS 2.0 05/26/2019 1046   MONOABS 0.6 05/26/2019 1046   EOSABS 0.1 05/26/2019 1046   BASOSABS 0.0 05/26/2019 1046    No results found for: POCLITH, LITHIUM   No results found for: PHENYTOIN, PHENOBARB, VALPROATE, CBMZ   .res Assessment: Plan:    Plan:  1. Wellbutrin XL 450mg  daily - in the am  2. Cymbalta 60mg  BID 3. Trazadone 100mg  - 2 at hs  4. Vraylar 1.5mg  daily though patient assistance. 5. Clonazepam 0.5mg  BID  Continue therapy with Bambi Cottle.   RTC 3 months  Patient advised to contact office with any questions, adverse effects, or acute worsening in signs and symptoms.  Discussed potential metabolic side effects associated with atypical antipsychotics, as well as  potential risk for movement side effects. Advised pt to contact office if movement side effects occur.  Diagnoses and all  orders for this visit:  PTSD (post-traumatic stress disorder) -     DULoxetine (CYMBALTA) 60 MG capsule; TAKE 1 CAPSULE TWICE DAILY.  Episodic mood disorder (HCC) -     cariprazine (VRAYLAR) 1.5 MG capsule; Take 1 capsule (1.5 mg total) by mouth daily.  Generalized anxiety disorder -     buPROPion (WELLBUTRIN XL) 150 MG 24 hr tablet; TAKE 3 TABLETS EVERY MORNING. -     clonazePAM (KLONOPIN) 0.5 MG tablet; Take 1 tablet (0.5 mg total) by mouth 2 (two) times daily as needed for anxiety. -     DULoxetine (CYMBALTA) 60 MG capsule; TAKE 1 CAPSULE TWICE DAILY.  Insomnia, unspecified type -     DULoxetine (CYMBALTA) 60 MG capsule; TAKE 1 CAPSULE TWICE DAILY. -     traZODone (DESYREL) 100 MG tablet; Take 2 tablets (200 mg total) by mouth at bedtime.  Major depressive disorder, recurrent episode, moderate (HCC) -     cariprazine (VRAYLAR) 1.5 MG capsule; Take 1 capsule (1.5 mg total) by mouth daily. -     buPROPion (WELLBUTRIN XL) 150 MG 24 hr tablet; TAKE 3 TABLETS EVERY MORNING. -     DULoxetine (CYMBALTA) 60 MG capsule; TAKE 1 CAPSULE TWICE DAILY. -     traZODone (DESYREL) 100 MG tablet; Take 2 tablets (200 mg total) by mouth at bedtime.     Please see After Visit Summary for patient specific instructions.  Future Appointments  Date Time Provider [place]  12/12/2021  2:00 PM Cottle, Lucious [place]ves, LCSW [facility] None  09/10/2022  9:50 AM [name], Adam R, DO [facility] None    No orders of the defined types were placed in this encounter.   -------------------------------

## 2021-12-12 ENCOUNTER — Ambulatory Visit (INDEPENDENT_AMBULATORY_CARE_PROVIDER_SITE_OTHER): Payer: Medicare HMO | Admitting: Psychology

## 2021-12-12 DIAGNOSIS — F4310 Post-traumatic stress disorder, unspecified: Secondary | ICD-10-CM

## 2021-12-12 NOTE — Progress Notes (Signed)
[facility] Counselor/Therapist Progress Note  Patient ID: Paige [name], MRN: [id number],    Date: 12/12/2021  Time Spent: 60 minutes  Treatment Type: Individual Therapy  Reported Symptoms: depression and worry  Mental Status Exam: Appearance:  Casual     Behavior: Appropriate  Motor: Normal  Speech/Language:  Normal Rate  Affect: Depressed  Mood: depressed  Thought process: normal  Thought content:   WNL  Sensory/Perceptual disturbances:   WNL  Orientation: oriented to person, place, time/date, and situation  Attention: Good  Concentration: Good  Memory: WNL  Fund of knowledge:  Good  Insight:   Good  Judgment:  Fair  Impulse Control: Fair   Risk Assessment: Danger to Self:  No Self-injurious Behavior: No Danger to Others: No Duty to Warn:no Physical Aggression / Violence:No  Access to Firearms a concern: No  Gang Involvement:No   Subjective: The patient attended a face-to-face individual therapy session via video visit due to COVID-19.  The patient gave verbal consent for this session to be on video on WebEx.  The patient was in her home alone and therapist was in the office alone.  The patient presents as pleasant and cooperative.  The patient states that her new boyfriend is redoing her bathroom.  He is a Development worker, community and he is helping her with this task.  The patient talked about the relationship and how it is going and she feels like it is going well.  We talked a little bit about how to move forward and make decisions about the relationship as she has not had a relationship in a very long time.  We talked about how she is so different than she was when I first started seeing her as she is dating and previously she was dissociating so much that she could not even think about a relationship and she was living in the past.  She is functioning so much better and is doing very well.  She wants to continue therapy though every 3 weeks now.  Interventions:  Cognitive Behavioral Therapy  Diagnosis:PTSD (post-traumatic stress disorder)  Plan: Treatment Plan  Strengths/Abilities:  Intelligent, kind, motivated  Treatment Preferences:  Outpatient Individual therapy  Statement of Needs:  "I need some help with getting back out into the world and learn how to interact and be social"   Symptoms:  Describes a reliving of the event, particularly through dissociative flashbacks.:  (Status: improved). Displays a significant decline in interest and engagement in activities.: (Status: improved). Displays significant psychological and/or physiological  distress resulting from internal and external clues that are reminiscent of the traumatic event.:  (Status: improved). Experiences disturbances in sleep.:   (Status: improved). Experiences disturbing and persistent thoughts, images, and/or perceptions of the  traumatic event.: (Status: improved). Experiences frequent nightmares.:  (Status: improved). Has been exposed to a traumatic event involving actual or  perceived threat of death or serious injury.: (Status: maintained).  Hypervigilance (e.g., feeling constantly on edge, experiencing concentration difficulties, having trouble falling or staying asleep, exhibiting a general state of irritability).: (Status:  improved). Impairment in social, occupational, or other areas of functioning.:  (Status: improved). Intentionally avoids activities, places, people, or objects (e.g., up-armored vehicles) that evoke memories of the event.:  (Status: improved). Intentionally avoids  thoughts, feelings, or discussions related to the traumatic event.: (Status:  improved). Reports difficulty concentrating as well as feelings of guilt.:   (Status: improved). Reports response of intense fear, helplessness, or horror to the traumatic event.: (Status: improved). Symptoms present more than  one month.:  (Status: maintained).    Problems Addressed:  Anxiety, Posttraumatic Stress  Disorder (PTSD),  Goals:  LTG:  1. Enhance ability to effectively cope with the full variety of life's worries  and anxieties.  70% 2. No longer avoids persons, places, activities, and objects that are  reminiscent of the traumatic event.  70% 3. No longer experiences intrusive event recollections, avoidance of event  reminders, intense arousal, or disinterest in activities or  relationships.  70% 4. Stabilize anxiety level while increasing ability to function on a daily  basis.  80% 5. Thinks about or openly discusses the traumatic event with others  without experiencing psychological or physiological distress.  80%  STG: 1.Identify and engage in pleasant activities on a daily basis..  80% 2.Identify, challenge, and replace biased, fearful self-talk with positive, realistic, and empowering self talk 3.  Participate in Cognitive Therapy to help identify, challenge, and replace biased, negative, and selfdefeating thoughts resulting from the trauma.  80 % 4.  Participate in Eye Movement Desensitization and Reprocessing (EMDR) to reduce emotional distress  related to traumatic thoughts, feelings, and images.  90%  Target Date:  02/16/2022 Frequency: Biweekly Modality:Individual Therapy Interventions by Therapist:  CBT, EMDR, insight oriented therapy, problem solving  Patient approved treatment plan and is progressing nicely.  Eland Lamantia G Elasha Tess, LCSW        I

## 2021-12-18 ENCOUNTER — Telehealth: Payer: Self-pay

## 2021-12-18 NOTE — Telephone Encounter (Signed)
New message   This request has been approved.  Please note any additional information provided by [facility] at the bottom of your screen.  Delories [employer] (Key: Bing Matter) Aimovig 70MG /ML auto-injectors   Form [employer] Electronic PA Form Created 17 minutes ago Sent to Plan 14 minutes ago Plan Response 13 minutes ago Submit Clinical Questions 1 minute ago Determination Favorable less than a minute ago Message from Plan PA Case: [id number], Status: Approved, Coverage Starts on: 12/02/2021 12:00:00 AM, Coverage Ends on: 12/01/2022 12:00:00 AM. Questions? Contact [phone].

## 2022-01-02 ENCOUNTER — Ambulatory Visit (INDEPENDENT_AMBULATORY_CARE_PROVIDER_SITE_OTHER): Payer: Medicare HMO | Admitting: Psychology

## 2022-01-02 DIAGNOSIS — F4310 Post-traumatic stress disorder, unspecified: Secondary | ICD-10-CM | POA: Diagnosis not present

## 2022-01-02 NOTE — Progress Notes (Signed)
[place] Counselor/Therapist Progress Note  Patient ID: Paige [name], MRN: [id number],    Date: 01/02/2022  Time Spent: 45 minutes  Treatment Type: Individual Therapy  Reported Symptoms: depression and worry  Mental Status Exam: Appearance:  Casual     Behavior: Appropriate  Motor: Normal  Speech/Language:  Normal Rate  Affect: Depressed  Mood: depressed  Thought process: normal  Thought content:   WNL  Sensory/Perceptual disturbances:   WNL  Orientation: oriented to person, place, time/date, and situation  Attention: Good  Concentration: Good  Memory: WNL  Fund of knowledge:  Good  Insight:   Good  Judgment:  Fair  Impulse Control: Fair   Risk Assessment: Danger to Self:  No Self-injurious Behavior: No Danger to Others: No Duty to Warn:no Physical Aggression / Violence:No  Access to Firearms a concern: No  Gang Involvement:No   Subjective: The patient attended a face-to-face individual therapy session via video visit due to COVID-19.  The patient gave verbal consent for this session to be on video on WebEx.  The patient was in her home alone and therapist was in the office alone.  The patient presents as pleasant and cooperative.  The patient was a little late getting on for the session today.  The patient states that things are going well and she is doing well in her new relationship.  The patient states that her bathroom is now finished and she feels good about this.  We talked about how she is managing her emotions and she seems to be handling things very well.  The patient is surprised at herself for having a relationship because she has not had one in 56 years .  The patient continues to do well and would like to continue therapy every 3 weeks.  Interventions: Cognitive Behavioral Therapy  Diagnosis:PTSD (post-traumatic stress disorder)  Plan: Treatment Plan  Strengths/Abilities:  Intelligent, kind, motivated  Treatment Preferences:   Outpatient Individual therapy  Statement of Needs:  "I need some help with getting back out into the world and learn how to interact and be social"   Symptoms:  Describes a reliving of the event, particularly through dissociative flashbacks.:  (Status: improved). Displays a significant decline in interest and engagement in activities.: (Status: improved). Displays significant psychological and/or physiological  distress resulting from internal and external clues that are reminiscent of the traumatic event.:  (Status: improved). Experiences disturbances in sleep.:   (Status: improved). Experiences disturbing and persistent thoughts, images, and/or perceptions of the  traumatic event.: (Status: improved). Experiences frequent nightmares.:  (Status: improved). Has been exposed to a traumatic event involving actual or  perceived threat of death or serious injury.: (Status: maintained).  Hypervigilance (e.g., feeling constantly on edge, experiencing concentration difficulties, having trouble falling or staying asleep, exhibiting a general state of irritability).: (Status:  improved). Impairment in social, occupational, or other areas of functioning.:  (Status: improved). Intentionally avoids activities, places, people, or objects (e.g., up-armored vehicles) that evoke memories of the event.:  (Status: improved). Intentionally avoids  thoughts, feelings, or discussions related to the traumatic event.: (Status:  improved). Reports difficulty concentrating as well as feelings of guilt.:   (Status: improved). Reports response of intense fear, helplessness, or horror to the traumatic event.: (Status: improved). Symptoms present more than one month.:  (Status: maintained).    Problems Addressed:  Anxiety, Posttraumatic Stress Disorder (PTSD),  Goals:  LTG:  1. Enhance ability to effectively cope with the full variety of life's worries  and anxieties.  70%  2. No longer avoids persons, places,  activities, and objects that are  reminiscent of the traumatic event.  70% 3. No longer experiences intrusive event recollections, avoidance of event  reminders, intense arousal, or disinterest in activities or  relationships.  70% 4. Stabilize anxiety level while increasing ability to function on a daily  basis.  80% 5. Thinks about or openly discusses the traumatic event with others  without experiencing psychological or physiological distress.  80%  STG: 1.Identify and engage in pleasant activities on a daily basis..  80% 2.Identify, challenge, and replace biased, fearful self-talk with positive, realistic, and empowering self talk 3.  Participate in Cognitive Therapy to help identify, challenge, and replace biased, negative, and selfdefeating thoughts resulting from the trauma.  80 % 4.  Participate in Eye Movement Desensitization and Reprocessing (EMDR) to reduce emotional distress  related to traumatic thoughts, feelings, and images.  90%  Target Date:  02/16/2022 Frequency: Biweekly Modality:Individual Therapy Interventions by Therapist:  CBT, EMDR, insight oriented therapy, problem solving  Patient approved treatment plan and is progressing nicely.  Kasi Lasky G Tyquarius Paglia, LCSW                        Tami Barren G Marleena Shubert, LCSW

## 2022-01-08 DIAGNOSIS — Z1231 Encounter for screening mammogram for malignant neoplasm of breast: Secondary | ICD-10-CM | POA: Diagnosis not present

## 2022-01-08 DIAGNOSIS — Z01419 Encounter for gynecological examination (general) (routine) without abnormal findings: Secondary | ICD-10-CM | POA: Diagnosis not present

## 2022-01-08 DIAGNOSIS — Z124 Encounter for screening for malignant neoplasm of cervix: Secondary | ICD-10-CM | POA: Diagnosis not present

## 2022-01-23 ENCOUNTER — Ambulatory Visit (INDEPENDENT_AMBULATORY_CARE_PROVIDER_SITE_OTHER): Payer: Medicare HMO | Admitting: Psychology

## 2022-01-23 DIAGNOSIS — F4310 Post-traumatic stress disorder, unspecified: Secondary | ICD-10-CM

## 2022-01-23 NOTE — Progress Notes (Signed)
[name]  Patient ID: Paige [name], MRN: [id number],    Date: 01/23/2022  Time Spent: 60 minutes  Treatment Type: Individual Therapy  Reported Symptoms: depression and worry  Mental Status Exam: Appearance:  Casual     Behavior: Appropriate  Motor: Normal  Speech/Language:  Normal Rate  Affect: Depressed  Mood: depressed  Thought process: normal  Thought content:   WNL  Sensory/Perceptual disturbances:   WNL  Orientation: oriented to person, place, time/date, and situation  Attention: Good  Concentration: Good  Memory: WNL  Fund of knowledge:  Good  Insight:   Good  Judgment:  Fair  Impulse Control: Fair   Risk Assessment: Danger to Self:  No Self-injurious Behavior: No Danger to Others: No Duty to Warn:no Physical Aggression / Violence:No  Access to Firearms a concern: No  Gang Involvement:No   Subjective: The patient attended a face-to-face individual therapy session via video visit due to COVID-19.  The patient gave verbal consent for this session to be on video on WebEx.  The patient was in her home alone and therapist was in the office alone.  The patient presents as tired and depressed.  The patient reports that she has been feeling more tired and depressed for the last 3 weeks.  We explored this further and it does not seem like there is any situational events that are going on that would cause her to have this feeling.  I recommended that she check her medications to make sure she is taking them correctly and if she is to contact her medication provider and let her know that her medicines might not be working as well.  The patient is currently seeing a gentleman and that seems to be going okay.  I explored with her whether she felt like she was stuck in this relationship because she allowed him to renovate her bathroom.  She does not seem as interested in him as he is in her.  She also reported that one of her  old boyfriends passed away about a week ago and this could be causing her some issues but I am not sure that it would cause something this different.  We will continue to explore the circumstance and she knows she can contact me if she needs to between now and the next time I see her.    Interventions: Cognitive Behavioral Therapy  Diagnosis:PTSD (post-traumatic stress disorder)  Plan: Treatment Plan  Strengths/Abilities:  Intelligent, kind, motivated  Treatment Preferences:  Outpatient Individual therapy  Statement of Needs:  "I need some help with getting back out into the world and learn how to interact and be social"   Symptoms:  Describes a reliving of the event, particularly through dissociative flashbacks.:  (Status: improved). Displays a significant decline in interest and engagement in activities.: (Status: improved). Displays significant psychological and/or physiological  distress resulting from internal and external clues that are reminiscent of the traumatic event.:  (Status: improved). Experiences disturbances in sleep.:   (Status: improved). Experiences disturbing and persistent thoughts, images, and/or perceptions of the  traumatic event.: (Status: improved). Experiences frequent nightmares.:  (Status: improved). Has been exposed to a traumatic event involving actual or  perceived threat of death or serious injury.: (Status: maintained).  Hypervigilance (e.g., feeling constantly on edge, experiencing concentration difficulties, having trouble falling or staying asleep, exhibiting a general state of irritability).: (Status:  improved). Impairment in social, occupational, or other areas of functioning.:  (Status: improved). Intentionally avoids activities, places, people,  or objects (e.g., up-armored vehicles) that evoke memories of the event.:  (Status: improved). Intentionally avoids  thoughts, feelings, or discussions related to the traumatic event.: (Status:  improved).  Reports difficulty concentrating as well as feelings of guilt.:   (Status: improved). Reports response of intense fear, helplessness, or horror to the traumatic event.: (Status: improved). Symptoms present more than one month.:  (Status: maintained).    Problems Addressed:  Anxiety, Posttraumatic Stress Disorder (PTSD),  Goals:  LTG:  1. Enhance ability to effectively cope with the full variety of life's worries  and anxieties.  70% 2. No longer avoids persons, places, activities, and objects that are  reminiscent of the traumatic event.  70% 3. No longer experiences intrusive event recollections, avoidance of event  reminders, intense arousal, or disinterest in activities or  relationships.  70% 4. Stabilize anxiety level while increasing ability to function on a daily  basis.  80% 5. Thinks about or openly discusses the traumatic event with others  without experiencing psychological or physiological distress.  80%  STG: 1.Identify and engage in pleasant activities on a daily basis..  80% 2.Identify, challenge, and replace biased, fearful self-talk with positive, realistic, and empowering self talk 3.  Participate in Cognitive Therapy to help identify, challenge, and replace biased, negative, and selfdefeating thoughts resulting from the trauma.  80 % 4.  Participate in Eye Movement Desensitization and Reprocessing (EMDR) to reduce emotional distress  related to traumatic thoughts, feelings, and images.  90%  Target Date:  02/16/2022 Frequency: Biweekly Modality:Individual Therapy Interventions by Therapist:  CBT, EMDR, insight oriented therapy, problem solving  Patient approved treatment plan and is progressing nicely.  Paige Cockrum G Talbot Monarch, LCSW                        Nicky Kras G Dustan Hyams, LCSW               Tehran Rabenold G Taniela Feltus, LCSW

## 2022-02-01 DIAGNOSIS — J302 Other seasonal allergic rhinitis: Secondary | ICD-10-CM | POA: Diagnosis not present

## 2022-02-08 DIAGNOSIS — G894 Chronic pain syndrome: Secondary | ICD-10-CM | POA: Diagnosis not present

## 2022-02-08 DIAGNOSIS — M797 Fibromyalgia: Secondary | ICD-10-CM | POA: Diagnosis not present

## 2022-02-08 DIAGNOSIS — M4326 Fusion of spine, lumbar region: Secondary | ICD-10-CM | POA: Diagnosis not present

## 2022-02-08 DIAGNOSIS — M545 Low back pain, unspecified: Secondary | ICD-10-CM | POA: Diagnosis not present

## 2022-02-11 ENCOUNTER — Other Ambulatory Visit: Payer: Self-pay | Admitting: Orthopaedic Surgery

## 2022-02-11 DIAGNOSIS — M4326 Fusion of spine, lumbar region: Secondary | ICD-10-CM

## 2022-02-12 DIAGNOSIS — Z1231 Encounter for screening mammogram for malignant neoplasm of breast: Secondary | ICD-10-CM | POA: Diagnosis not present

## 2022-02-13 ENCOUNTER — Ambulatory Visit (INDEPENDENT_AMBULATORY_CARE_PROVIDER_SITE_OTHER): Payer: Medicare HMO | Admitting: Psychology

## 2022-02-13 DIAGNOSIS — F4310 Post-traumatic stress disorder, unspecified: Secondary | ICD-10-CM

## 2022-02-13 NOTE — Progress Notes (Signed)
[facility] Counselor/Therapist Progress Note ? ?Patient ID: KATHARINA JEHLE, MRN: [id number],   ? ?Date: 02/13/2022 ? ?Time Spent: 60 minutes ? ?Treatment Type: Individual Therapy ? ?Reported Symptoms: depression and worry ? ?Mental Status Exam: ?Appearance:  Casual     ?Behavior: Appropriate  ?Motor: Normal  ?Speech/Language:  Normal Rate  ?Affect: Depressed  ?Mood: depressed  ?Thought process: normal  ?Thought content:   WNL  ?Sensory/Perceptual disturbances:   WNL  ?Orientation: oriented to person, place, time/date, and situation  ?Attention: Good  ?Concentration: Good  ?Memory: WNL  ?Fund of knowledge:  Good  ?Insight:   Good  ?Judgment:  Fair  ?Impulse Control: Fair  ? ?Risk Assessment: ?Danger to Self:  No ?Self-injurious Behavior: No ?Danger to Others: No ?Duty to Warn:no ?Physical Aggression / Violence:No  ?Access to Firearms a concern: No  ?Gang Involvement:No  ? ?Subjective: The patient attended a face-to-face individual therapy session via video visit due to COVID-19.  The patient gave verbal consent for this session to be on video on WebEx.  The patient was in her home alone and therapist was in the office alone.  The patient presents with a blunted affect and her mood is pleasant.  The patient states she continues to feel not motivated and having difficulty getting tasks done.  We talked about her implementing some things that might be helpful for her to structure herself better.  I recommended that she spend time setting goals for herself for a week and then getting those things done so that she will feel better about herself.  We talked about her not beating herself up for not having anything to do.  The patient is still involved in the new relationship and that seems to be going well.  We discussed the need for her to find some purpose and I recommended that she consider volunteer options.  She is also going to the doctor to see if there is something that might be can be done to  help her with her motivation from a medication perspective. ? ?Interventions: Cognitive Behavioral Therapy ? ?Diagnosis:PTSD (post-traumatic stress disorder) ? ?Plan: Treatment Plan ? ?Strengths/Abilities:  Intelligent, kind, motivated ? ?Treatment Preferences:  Outpatient Individual therapy ? ?Statement of Needs:  "I need some help with getting back out into the world and learn how to interact and be social"  ? ?Symptoms:  Describes a reliving of the event, particularly through dissociative flashbacks.:  ?(Status: improved). Displays a significant decline in interest and engagement in activities.: (Status: improved). Displays significant psychological and/or physiological  ?distress resulting from internal and external clues that are reminiscent of the traumatic event.:  (Status: improved). Experiences disturbances in sleep.:   ?(Status: improved). Experiences disturbing and persistent thoughts, images, and/or perceptions of the  ?traumatic event.: (Status: improved). Experiences frequent nightmares.:  (Status: improved). Has been exposed to a traumatic event involving actual or  ?perceived threat of death or serious injury.: (Status: maintained).  ?Hypervigilance (e.g., feeling constantly on edge, experiencing concentration difficulties, having trouble ?falling or staying asleep, exhibiting a general state of irritability).: (Status:  ?improved). Impairment in social, occupational, or other areas of functioning.:  ?(Status: improved). Intentionally avoids activities, places, people, or objects (e.g., up-armored vehicles) ?that evoke memories of the event.:  (Status: improved). Intentionally avoids  ?thoughts, feelings, or discussions related to the traumatic event.: (Status:  ?improved). Reports difficulty concentrating as well as feelings of guilt.:   ?(Status: improved). Reports response of intense fear, helplessness, or horror to the traumatic event.: (  Status: improved). Symptoms present more than one month.:   (Status: maintained).  ? ? ?Problems Addressed:  Anxiety, Posttraumatic Stress Disorder (PTSD), ? ?Goals:  ?LTG:  1. Enhance ability to effectively cope with the full variety of life's worries  ?and anxieties.  70% ?2. No longer avoids persons, places, activities, and objects that are  ?reminiscent of the traumatic event.  70% ?3. No longer experiences intrusive event recollections, avoidance of event  ?reminders, intense arousal, or disinterest in activities or  ?relationships.  70% ?4. Stabilize anxiety level while increasing ability to function on a daily  ?basis.  80% ?5. Thinks about or openly discusses the traumatic event with others  ?without experiencing psychological or physiological distress.  80% ? ?STG: 1.Identify and engage in pleasant activities on a daily basis..  80% ?2.Identify, challenge, and replace biased, fearful self-talk with positive, realistic, and empowering self talk ?3.  Participate in Cognitive Therapy to help identify, challenge, and replace biased, negative, and selfdefeating thoughts resulting from the trauma.  80 % ?4.  Participate in Eye Movement Desensitization and Reprocessing (EMDR) to reduce emotional distress  ?related to traumatic thoughts, feelings, and images.  90% ? ?Target Date:  02/17/2023 ?Frequency: Biweekly ?Modality:Individual Therapy ?Interventions by Therapist:  CBT, EMDR, insight oriented therapy, problem solving ? ?Patient approved treatment plan and is progressing nicely. ? ?Mandie Crabbe G Sheehan Stacey, LCSW ? ? ?   ? ? ? ? ? ? ? ? ? ? ? ? ? ? ? ? ? ? ?Alonna Bartling G Jamill Wetmore, LCSW ? ? ? ? ? ? ? ? ? ? ? ? ? ? ?Lennyn Bellanca G Shaunette Gassner, LCSW ? ? ? ? ? ? ? ? ? ? ? ? ? ? ?Jackelin Correia G Farren Landa, LCSW ?

## 2022-02-18 DIAGNOSIS — R5383 Other fatigue: Secondary | ICD-10-CM | POA: Diagnosis not present

## 2022-02-18 DIAGNOSIS — N951 Menopausal and female climacteric states: Secondary | ICD-10-CM | POA: Diagnosis not present

## 2022-02-19 DIAGNOSIS — H04123 Dry eye syndrome of bilateral lacrimal glands: Secondary | ICD-10-CM | POA: Diagnosis not present

## 2022-02-19 DIAGNOSIS — H1045 Other chronic allergic conjunctivitis: Secondary | ICD-10-CM | POA: Diagnosis not present

## 2022-02-19 DIAGNOSIS — H31093 Other chorioretinal scars, bilateral: Secondary | ICD-10-CM | POA: Diagnosis not present

## 2022-02-19 DIAGNOSIS — H25813 Combined forms of age-related cataract, bilateral: Secondary | ICD-10-CM | POA: Diagnosis not present

## 2022-02-20 DIAGNOSIS — R5383 Other fatigue: Secondary | ICD-10-CM | POA: Diagnosis not present

## 2022-02-20 DIAGNOSIS — N951 Menopausal and female climacteric states: Secondary | ICD-10-CM | POA: Diagnosis not present

## 2022-02-20 DIAGNOSIS — Z6833 Body mass index (BMI) 33.0-33.9, adult: Secondary | ICD-10-CM | POA: Diagnosis not present

## 2022-02-20 DIAGNOSIS — R232 Flushing: Secondary | ICD-10-CM | POA: Diagnosis not present

## 2022-02-22 ENCOUNTER — Ambulatory Visit
Admission: RE | Admit: 2022-02-22 | Discharge: 2022-02-22 | Disposition: A | Discharge status: Home / Self Care | Payer: Medicare HMO | Source: Ambulatory Visit | Attending: Orthopaedic Surgery | Admitting: Orthopaedic Surgery

## 2022-02-22 ENCOUNTER — Other Ambulatory Visit: Payer: Self-pay

## 2022-02-22 DIAGNOSIS — M5416 Radiculopathy, lumbar region: Secondary | ICD-10-CM | POA: Diagnosis not present

## 2022-02-22 DIAGNOSIS — M4326 Fusion of spine, lumbar region: Secondary | ICD-10-CM | POA: Diagnosis not present

## 2022-02-22 DIAGNOSIS — M5136 Other intervertebral disc degeneration, lumbar region: Secondary | ICD-10-CM | POA: Diagnosis not present

## 2022-02-22 MED ORDER — ONDANSETRON HCL 4 MG/2ML IJ SOLN
4.0000 mg | Freq: Once | INTRAMUSCULAR | Status: AC | PRN
  Administered 2022-02-22: 4 mg via INTRAMUSCULAR

## 2022-02-22 MED ORDER — DIAZEPAM 5 MG PO TABS
10.0000 mg | ORAL_TABLET | Freq: Once | ORAL | Status: AC
  Administered 2022-02-22: 10 mg via ORAL

## 2022-02-22 MED ORDER — IOPAMIDOL (ISOVUE-M 200) INJECTION 41%
18.0000 mL | Freq: Once | INTRAMUSCULAR | Status: AC
  Administered 2022-02-22: 18 mL via INTRATHECAL

## 2022-02-22 MED ORDER — MEPERIDINE HCL 50 MG/ML IJ SOLN
50.0000 mg | Freq: Once | INTRAMUSCULAR | Status: AC | PRN
  Administered 2022-02-22: 50 mg via INTRAMUSCULAR

## 2022-02-22 NOTE — Discharge Instructions (Signed)

## 2022-02-22 NOTE — Discharge Instr - Other Orders (Addendum)
1045: pt reports pain 8/10 from myelogram procedure. See mar.  ?1107: pt reports pain 6/10. Will remain in nursing station until d/c time.  ?

## 2022-02-25 ENCOUNTER — Telehealth: Payer: Self-pay | Admitting: Adult Health

## 2022-02-25 NOTE — Telephone Encounter (Signed)
Paige [name] has been reapproved for Pt. Assistance for [place] until 12/01/22.  She will receive this medication from [facility]. ?

## 2022-03-04 ENCOUNTER — Ambulatory Visit (INDEPENDENT_AMBULATORY_CARE_PROVIDER_SITE_OTHER): Payer: Medicare HMO | Admitting: Adult Health

## 2022-03-04 ENCOUNTER — Encounter: Payer: Self-pay | Admitting: Adult Health

## 2022-03-04 DIAGNOSIS — F39 Unspecified mood [affective] disorder: Secondary | ICD-10-CM | POA: Diagnosis not present

## 2022-03-04 DIAGNOSIS — F331 Major depressive disorder, recurrent, moderate: Secondary | ICD-10-CM

## 2022-03-04 DIAGNOSIS — G4700 Insomnia, unspecified: Secondary | ICD-10-CM

## 2022-03-04 DIAGNOSIS — F4310 Post-traumatic stress disorder, unspecified: Secondary | ICD-10-CM

## 2022-03-04 DIAGNOSIS — F411 Generalized anxiety disorder: Secondary | ICD-10-CM | POA: Diagnosis not present

## 2022-03-04 NOTE — Progress Notes (Signed)
Paige [name] ?[id number] ?Jul 31, 1966 56 y.o. ? ?Virtual Visit via Telephone Note ? ?I connected with pt on 03/04/22 at 11:40 AM EDT by telephone and verified that I am speaking with the correct person using two identifiers. ?  ?I discussed the limitations, risks, security and privacy concerns of performing an evaluation and management service by telephone and the availability of in person appointments. I also discussed with the patient that there may be a patient responsible charge related to this service. The patient expressed understanding and agreed to proceed. ?  ?I discussed the assessment and treatment plan with the patient. The patient was provided an opportunity to ask questions and all were answered. The patient agreed with the plan and demonstrated an understanding of the instructions. ?  ?The patient was advised to call back or seek an in-person evaluation if the symptoms worsen or if the condition fails to improve as anticipated. ? ?I provided 25 minutes of non-face-to-face time during this encounter.  The patient was located at home.  The provider was located at [facility]. ? ? ?Aloha Gell, NP ? ? ?Subjective:  ? ?Patient ID:  Paige [name] is a 56 y.o. (DOB 1966-01-30) female. ? ?Chief Complaint: No chief complaint on file. ? ? ?HPI ?[name] presents for follow-up of GAD, MDD, PTSD, insomnia. ? ?Describes mood today as "not too good". Pleasant. Tearful at times. Mood symptoms - reports depression, anxiety, and irritability at times. Spending more money than she should be - "that is what I do when I get a little higher". Reports racing thoughts. Denies mania - moods are swinging - more low overall. Stating "I'm not doing as well as I was". Feels like medications need to be adjusted. Returning from a trip to [place] to visit family. Varying interest and motivation. Continues to see therapist Chiropractor) every other week for PTSD. Taking medications as  prescribed.  ?Energy levels improved. Active, walking daily - averages - 10,000 steps a day over recent vacation to [place] - goal of 5,000 a daily. ?Enjoys some usual interests and activities. Dating. Has a boyfriend. Lives alone with 2 cats - "Marcello Moores and Hancock" - 8 chicks. Mother local and supportive. ?Appetite adequate. Weight gain - 220 to 227 pounds. ?Sleeps better some nights than others. Averages 4 to 7 hours. ?Focus and concentration "not good". Difficulties getting things done around the house. Disabled since 2009. ?Denies SI or HI.  ?Denies AH or VH. ? ?Previous medication trials: Lexapro, Paxil, Trazadone, Wellbutrin SR, Geodon, Seroquel, Risperdal, Depakote, Topamax, Lithium, Provigil, Ambien, Cytomel, Clonazepam and other benzodixepaines, Elavil, Effexor, Remeron, Prozac, Anafranil, Tofranil, Zoloft, Geodon, Buspar, Adderall, Ritalin, Abilify, Rexulti, Lamictal, Latuda ? ? ? ?Review of Systems:  ?Review of Systems  ?Musculoskeletal:  Negative for gait problem.  ?Neurological:  Negative for tremors.  ?Psychiatric/Behavioral:    ?     Please refer to HPI  ? ?Medications: I have reviewed the patient's current medications. ? ?Current Outpatient Medications  ?Medication Sig Dispense Refill  ? buPROPion (WELLBUTRIN XL) 150 MG 24 hr tablet TAKE 3 TABLETS EVERY MORNING. 270 tablet 3  ? cariprazine (VRAYLAR) 1.5 MG capsule Take 1 capsule (1.5 mg total) by mouth daily. 90 capsule 3  ? cetirizine (ZYRTEC) 10 MG tablet Take by mouth. (Patient not taking: Reported on 10/04/2021)    ? clonazePAM (KLONOPIN) 0.5 MG tablet Take 1 tablet (0.5 mg total) by mouth 2 (two) times daily as needed for anxiety. 30 tablet 2  ?  dicyclomine (BENTYL) 10 MG capsule Take 1 capsule (10 mg total) by mouth 3 (three) times daily as needed for spasms. (Patient not taking: Reported on 10/04/2021) 90 capsule 11  ? DOTTI 0.0375 MG/24HR Place onto the skin.    ? DULoxetine (CYMBALTA) 60 MG capsule TAKE 1 CAPSULE TWICE DAILY. 180 capsule 3   ? Erenumab-aooe (AIMOVIG) 70 MG/ML SOAJ INJECT 70 MG INTO THE SKIN EVERY 28 DAYS. 1 mL 11  ? Fe Fum-Fe Poly-Vit C-Lactobac (FUSION) 65-65-25-30 MG CAPS Take by mouth. (Patient not taking: Reported on 10/04/2021)    ? ipratropium (ATROVENT) 0.06 % nasal spray INSTILL 2 SPRAYS INTO EACH NOSTRIL AS NEEDED TWICE A DAY (Patient not taking: Reported on 10/04/2021)    ? levothyroxine (SYNTHROID, LEVOTHROID) 88 MCG tablet Take 88 mcg by mouth daily before breakfast.    ? liothyronine (CYTOMEL) 5 MCG tablet Take 5 mcg by mouth daily. Monday Wednesday Friday    ? nystatin cream (MYCOSTATIN) Apply 1 application topically daily as needed for dry skin. (Patient not taking: Reported on 10/04/2021)    ? olopatadine (PATANOL) 0.1 % ophthalmic solution Apply to eye. (Patient not taking: Reported on 10/04/2021)    ? omeprazole (PRILOSEC) 20 MG capsule     ? ondansetron (ZOFRAN-ODT) 4 MG disintegrating tablet Take 1 tablet (4 mg total) by mouth every 8 (eight) hours as needed for nausea or vomiting. 20 tablet 6  ? Oxycodone HCl 10 MG TABS Take 10 mg by mouth every 6 (six) hours as needed.    ? Polyethyl Glycol-Propyl Glycol 0.4-0.3 % SOLN Apply 1 drop to eye as needed. (Patient not taking: No sig reported)    ? polyethylene glycol (MIRALAX / GLYCOLAX) 17 g packet Take 17 g by mouth 2 (two) times daily. (Patient not taking: Reported on 10/04/2021)    ? potassium chloride (K-DUR,KLOR-CON) 10 MEQ tablet Take 1 tablet by mouth daily.    ? progesterone (PROMETRIUM) 200 MG capsule Take 200 mg by mouth at bedtime.    ? promethazine (PHENERGAN) 12.5 MG tablet Take 1 tablet (12.5 mg total) by mouth every 8 (eight) hours as needed for nausea or vomiting. (Patient not taking: Reported on 10/04/2021) 30 tablet 0  ? rOPINIRole (REQUIP) 2 MG tablet Take 2 mg by mouth 2 (two) times daily.    ? terconazole (TERAZOL 3) 0.8 % vaginal cream Place 1 applicator vaginally at bedtime. (Patient not taking: Reported on 10/04/2021)    ? tiZANidine (ZANAFLEX) 2 MG  tablet Take 2 mg by mouth 3 (three) times daily. (Patient not taking: Reported on 10/04/2021)    ? torsemide (DEMADEX) 20 MG tablet Take 20 mg by mouth daily.     ? traZODone (DESYREL) 100 MG tablet Take 2 tablets (200 mg total) by mouth at bedtime. 180 tablet 3  ? Ubrogepant (UBRELVY) 100 MG TABS Take 1 tablet by mouth daily as needed (at onset of headaches may repeat dose after 2 hours as needed max 200 mg in 24 hours). (Patient not taking: Reported on 10/04/2021) 16 tablet 11  ? ?Current Facility-Administered Medications  ?Medication Dose Route Frequency Provider Last Rate Last Admin  ? 0.9 %  sodium chloride infusion  500 mL Intravenous Once Ladene Artist, MD      ? ? ?Medication Side Effects: None ? ?Allergies:  ?Allergies  ?Allergen Reactions  ? Cyclobenzaprine Other (See Comments)  ?  Other reaction(s): Unknown ?Hallucinations ?Unknown ?  ? Meloxicam Other (See Comments)  ? Naltrexone Other (See Comments)  ?  SEVERE PAIN ALL OVER  ? Pork (Porcine) Protein Other (See Comments)  ?  Other reaction(s): Other (See Comments) ?Migraines ?Other reaction(s): Other (See Comments) ?Migraines  ? Sumatriptan Other (See Comments)  ?  Difficulty breathing ?Other reaction(s): Other (see comments) ?Off balance ?  ? Tape Dermatitis  ? Flexeril [Cyclobenzaprine Hcl]   ? Imitrex [Sumatriptan Base]   ? Other   ? Sulfa Antibiotics   ?  Pt states she does not think she is allergic to Sulfa   ? ? ?Past Medical History:  ?Diagnosis Date  ? Allergic rhinitis   ? Allergy   ? Anemia   ? Anxiety disorder   ? Cancer [facility])   ? skin  ? Chronic fatigue   ? Chronic headaches   ? Chronic pain   ? Depression   ? Endometriosis   ? Family history of colon cancer   ? mother,uncles,aunts  ? Fibromyalgia   ? Gallstones   ? GERD (gastroesophageal reflux disease)   ? Hypothyroidism   ? IBS (irritable bowel syndrome)   ? Insomnia   ? Iron deficiency   ? Laryngopharyngeal reflux (LPR)   ? Obesity   ? Personal history of colonic polyps 04/2006  ?  polypoid  ? PONV (postoperative nausea and vomiting)   ? RLS (restless legs syndrome)   ? Sleep apnea   ? no cpap  ? ? ?Family History  ?Problem Relation Age of Onset  ? Colon cancer Mother   ?     uncles,aunts  ?

## 2022-03-06 ENCOUNTER — Ambulatory Visit (INDEPENDENT_AMBULATORY_CARE_PROVIDER_SITE_OTHER): Payer: Medicare HMO | Admitting: Psychology

## 2022-03-06 DIAGNOSIS — F4310 Post-traumatic stress disorder, unspecified: Secondary | ICD-10-CM

## 2022-03-06 NOTE — Progress Notes (Signed)
[facility] Counselor/Therapist Progress Note ? ?Patient ID: VANELLOPE PASSMORE, MRN: [id number],   ? ?Date: 03/06/2022 ? ?Time Spent: 60 minutes ? ?Treatment Type: Individual Therapy ? ?Reported Symptoms: depression and worry ? ?Mental Status Exam: ?Appearance:  Casual     ?Behavior: Appropriate  ?Motor: Normal  ?Speech/Language:  Normal Rate  ?Affect: Depressed  ?Mood: depressed  ?Thought process: normal  ?Thought content:   WNL  ?Sensory/Perceptual disturbances:   WNL  ?Orientation: oriented to person, place, time/date, and situation  ?Attention: Good  ?Concentration: Good  ?Memory: WNL  ?Fund of knowledge:  Good  ?Insight:   Good  ?Judgment:  Fair  ?Impulse Control: Fair  ? ?Risk Assessment: ?Danger to Self:  No ?Self-injurious Behavior: No ?Danger to Others: No ?Duty to Warn:no ?Physical Aggression / Violence:No  ?Access to Firearms a concern: No  ?Gang Involvement:No  ? ?Subjective: The patient attended a face-to-face individual therapy session via video visit due to COVID-19.  The patient gave verbal consent for this session to be on video on WebEx.  The patient was in her home alone and therapist was in the office alone.  The patient presents with a blunted affect and mood is depressed.  The patient reports that she has a migraine headache today.  She also states that her cat slipped out the door and she is stressed about that.  We talked about what is going on in her life and she is involved in a relationship and she is not exactly sure where she wants it to go.  He seems to want to marry her and she is not sure that that is what she wants because he gets on her nerves some.  We processed how she feels about the relationship and I recommended that she wait to make any big decisions until she knows for sure. ? ?Interventions: Cognitive Behavioral Therapy ? ?Diagnosis:PTSD (post-traumatic stress disorder) ? ?Plan: Treatment Plan ? ?Strengths/Abilities:  Intelligent, kind, motivated ? ?Treatment  Preferences:  Outpatient Individual therapy ? ?Statement of Needs:  "I need some help with getting back out into the world and learn how to interact and be social"  ? ?Symptoms:  Describes a reliving of the event, particularly through dissociative flashbacks.:  ?(Status: improved). Displays a significant decline in interest and engagement in activities.: (Status: improved). Displays significant psychological and/or physiological  ?distress resulting from internal and external clues that are reminiscent of the traumatic event.:  (Status: improved). Experiences disturbances in sleep.:   ?(Status: improved). Experiences disturbing and persistent thoughts, images, and/or perceptions of the  ?traumatic event.: (Status: improved). Experiences frequent nightmares.:  (Status: improved). Has been exposed to a traumatic event involving actual or  ?perceived threat of death or serious injury.: (Status: maintained).  ?Hypervigilance (e.g., feeling constantly on edge, experiencing concentration difficulties, having trouble ?falling or staying asleep, exhibiting a general state of irritability).: (Status:  ?improved). Impairment in social, occupational, or other areas of functioning.:  ?(Status: improved). Intentionally avoids activities, places, people, or objects (e.g., up-armored vehicles) ?that evoke memories of the event.:  (Status: improved). Intentionally avoids  ?thoughts, feelings, or discussions related to the traumatic event.: (Status:  ?improved). Reports difficulty concentrating as well as feelings of guilt.:   ?(Status: improved). Reports response of intense fear, helplessness, or horror to the traumatic event.: (Status: improved). Symptoms present more than one month.:  (Status: maintained).  ? ? ?Problems Addressed:  Anxiety, Posttraumatic Stress Disorder (PTSD), ? ?Goals:  ?LTG:  1. Enhance ability to effectively cope with the  full variety of life's worries  ?and anxieties.  70% ?2. No longer avoids persons,  places, activities, and objects that are  ?reminiscent of the traumatic event.  70% ?3. No longer experiences intrusive event recollections, avoidance of event  ?reminders, intense arousal, or disinterest in activities or  ?relationships.  70% ?4. Stabilize anxiety level while increasing ability to function on a daily  ?basis.  80% ?5. Thinks about or openly discusses the traumatic event with others  ?without experiencing psychological or physiological distress.  80% ? ?STG: 1.Identify and engage in pleasant activities on a daily basis..  80% ?2.Identify, challenge, and replace biased, fearful self-talk with positive, realistic, and empowering self talk ?3.  Participate in Cognitive Therapy to help identify, challenge, and replace biased, negative, and selfdefeating thoughts resulting from the trauma.  80 % ?4.  Participate in Eye Movement Desensitization and Reprocessing (EMDR) to reduce emotional distress  ?related to traumatic thoughts, feelings, and images.  90% ? ?Target Date:  02/17/2023 ?Frequency: Biweekly ?Modality:Individual Therapy ?Interventions by Therapist:  CBT, EMDR, insight oriented therapy, problem solving ? ?Patient approved treatment plan and is progressing nicely. ? ?Heraclio Seidman G Bostyn Bogie, LCSW ? ? ?   ? ? ? ? ? ? ? ? ? ? ? ? ? ? ? ? ? ? ?Cailynn Bodnar G Nakeisha Greenhouse, LCSW ? ? ? ? ? ? ? ? ? ? ? ? ? ? ?Genice Kimberlin G Taneesha Edgin, LCSW ? ? ? ? ? ? ? ? ? ? ? ? ? ? ?Issachar Broady G Domenique Southers, LCSW ? ? ? ? ? ? ? ? ? ? ? ? ? ? ?Alyshia Kernan G Linday Rhodes, LCSW ?

## 2022-03-07 DIAGNOSIS — M545 Low back pain, unspecified: Secondary | ICD-10-CM | POA: Diagnosis not present

## 2022-03-07 DIAGNOSIS — M5136 Other intervertebral disc degeneration, lumbar region: Secondary | ICD-10-CM | POA: Diagnosis not present

## 2022-03-07 DIAGNOSIS — M4326 Fusion of spine, lumbar region: Secondary | ICD-10-CM | POA: Diagnosis not present

## 2022-03-07 DIAGNOSIS — G894 Chronic pain syndrome: Secondary | ICD-10-CM | POA: Diagnosis not present

## 2022-03-15 ENCOUNTER — Telehealth: Payer: Self-pay | Admitting: Adult Health

## 2022-03-15 NOTE — Telephone Encounter (Signed)
Pt LVM at 9:40a.  She says she needs a written script sent to [facility] stating she needs her Vraylar increased from 1.'5mg'$  to '3mg'$ .  Put it on a cover letter and reference her pt #[id number]. ? ? ?Next appt 5/1 ?

## 2022-03-15 NOTE — Telephone Encounter (Signed)
Please see message from patient

## 2022-03-18 NOTE — Telephone Encounter (Signed)
Traci - do you have her paperwork for this.

## 2022-03-19 NOTE — Telephone Encounter (Signed)
Noted  

## 2022-03-19 NOTE — Telephone Encounter (Signed)
We will update a Rx for her pt. Assistance.  ?

## 2022-03-21 NOTE — Telephone Encounter (Signed)
Noted thank you Leda Gauze I will take care of this.  ?

## 2022-03-21 NOTE — Telephone Encounter (Signed)
I spoke with [facility] this morning and all they need is just a regular written prescription with the new dose.  Should have pt. Name, DOB, Address and drs signature and date.  Be sure to indicated enough refills to get through 12/01/22.  It can be faxed to [facility] at [phone].  The fax cover sheet should indicate her pt # of [id number].  She was approved for pt assistance on 02/25/22 so she probably just received her first shipment of the 1.'5mg'$ . Each shipment is a 90 day supply. She should use the 1.'5mg'$  before requesting a new refill of the '3mg'$ . ?

## 2022-03-22 ENCOUNTER — Other Ambulatory Visit: Payer: Self-pay

## 2022-03-22 DIAGNOSIS — F39 Unspecified mood [affective] disorder: Secondary | ICD-10-CM

## 2022-03-22 DIAGNOSIS — F331 Major depressive disorder, recurrent, moderate: Secondary | ICD-10-CM

## 2022-03-22 MED ORDER — CARIPRAZINE HCL 3 MG PO CAPS: 3.0000 mg | ORAL_CAPSULE | Freq: Every day | ORAL | 3 refills | Status: DC

## 2022-03-26 DIAGNOSIS — M47816 Spondylosis without myelopathy or radiculopathy, lumbar region: Secondary | ICD-10-CM | POA: Diagnosis not present

## 2022-03-27 ENCOUNTER — Ambulatory Visit (INDEPENDENT_AMBULATORY_CARE_PROVIDER_SITE_OTHER): Payer: Medicare HMO | Admitting: Psychology

## 2022-03-27 DIAGNOSIS — F4310 Post-traumatic stress disorder, unspecified: Secondary | ICD-10-CM
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
## 2022-04-01 ENCOUNTER — Telehealth: Payer: Medicare HMO | Admitting: Adult Health

## 2022-04-04 ENCOUNTER — Ambulatory Visit (INDEPENDENT_AMBULATORY_CARE_PROVIDER_SITE_OTHER): Payer: Medicare HMO | Admitting: Adult Health

## 2022-04-04 ENCOUNTER — Encounter: Payer: Self-pay | Admitting: Adult Health

## 2022-04-04 DIAGNOSIS — F4310 Post-traumatic stress disorder, unspecified: Secondary | ICD-10-CM

## 2022-04-04 DIAGNOSIS — F411 Generalized anxiety disorder: Secondary | ICD-10-CM | POA: Diagnosis not present

## 2022-04-04 DIAGNOSIS — F331 Major depressive disorder, recurrent, moderate: Secondary | ICD-10-CM | POA: Diagnosis not present

## 2022-04-04 DIAGNOSIS — G4700 Insomnia, unspecified: Secondary | ICD-10-CM | POA: Diagnosis not present

## 2022-04-04 MED ORDER — CLONAZEPAM 0.5 MG PO TABS: 0.5000 mg | ORAL_TABLET | Freq: Two times a day (BID) | ORAL | 2 refills | Status: DC | PRN

## 2022-04-04 NOTE — Progress Notes (Signed)
NESSIE NONG ?[id number] ?04/01/1966 56 y.o. ? ?Subjective:  ? ?Patient ID:  Paige [name] is a 56 y.o. (DOB 24-Oct-1966) female. ? ?Chief Complaint: No chief complaint on file. ? ? ?HPI ?Paige [name] presents to the office today for follow-up of GAD, MDD, PTSD, insomnia. ? ?Describes mood today as "ok". Pleasant. Tearful at times. Mood symptoms - reports decreased depression, anxiety, and irritability at times. Mood is consistent. Decreased mood swings. Feels like the increase Vraylar has been help. Stating "I'm doing alright". Improved interest and motivation. Continues to see therapist Chiropractor) every other week for PTSD. She and partner doing well. Attends church - singing in the choir. Taking medications as prescribed.  ?Energy levels improved. Active, walking daily - averages - 10,000 steps a day over recent vacation to [place] - goal of 5,000 a daily. ?Enjoys some usual interests and activities. Dating. Has a boyfriend. Lives alone with 2 cats - "Marcello Moores and [place]on" - 8 chicks. Mother local and supportive. ?Appetite adequate. Weight gain - 220 to 227 pounds. ?Sleeps better some nights than others. Averages 4 to 7 hours. ?Focus and concentration "not good". Difficulties getting things done around the house. Disabled since 2009. ?Denies SI or HI.  ?Denies AH or VH. ? ?Previous medication trials: Lexapro, Paxil, Trazadone, Wellbutrin SR, Geodon, Seroquel, Risperdal, Depakote, Topamax, Lithium, Provigil, Ambien, Cytomel, Clonazepam and other benzodixepaines, Elavil, Effexor, Remeron, Prozac, Anafranil, Tofranil, Zoloft, Geodon, Buspar, Adderall, Ritalin, Abilify, Rexulti, Lamictal, Latuda ? ? ? ? ?Mini-Mental   ? ?[facility] Office Visit from 04/07/2018 in [facility]  ?Total Score (max 30 points ) 29  ? ?  ? ?PHQ2-9   ? ?Flowsheet Row Patient Outreach from 05/22/2018 in [facility] Patient Outreach from 05/13/2018 in [facility] Patient Outreach  Telephone from 04/30/2018 in [facility]  ?PHQ-2 Total Score '5 5 2  '$ ?PHQ-9 Total Score '20 18 13  '$ ? ?  ?  ? ?Review of Systems:  ?Review of Systems  ?Musculoskeletal:  Negative for gait problem.  ?Neurological:  Negative for tremors.  ?Psychiatric/Behavioral:    ?     Please refer to HPI  ? ?Medications: I have reviewed the patient's current medications. ? ?Current Outpatient Medications  ?Medication Sig Dispense Refill  ? buPROPion (WELLBUTRIN XL) 150 MG 24 hr tablet TAKE 3 TABLETS EVERY MORNING. 270 tablet 3  ? cariprazine (VRAYLAR) 3 MG capsule Take 1 capsule (3 mg total) by mouth daily. 90 capsule 3  ? cetirizine (ZYRTEC) 10 MG tablet Take by mouth. (Patient not taking: Reported on 10/04/2021)    ? clonazePAM (KLONOPIN) 0.5 MG tablet Take 1 tablet (0.5 mg total) by mouth 2 (two) times daily as needed for anxiety. 30 tablet 2  ? dicyclomine (BENTYL) 10 MG capsule Take 1 capsule (10 mg total) by mouth 3 (three) times daily as needed for spasms. (Patient not taking: Reported on 10/04/2021) 90 capsule 11  ? DOTTI 0.0375 MG/24HR Place onto the skin.    ? DULoxetine (CYMBALTA) 60 MG capsule TAKE 1 CAPSULE TWICE DAILY. 180 capsule 3  ? Erenumab-aooe (AIMOVIG) 70 MG/ML SOAJ INJECT 70 MG INTO THE SKIN EVERY 28 DAYS. 1 mL 11  ? Fe Fum-Fe Poly-Vit C-Lactobac (FUSION) 65-65-25-30 MG CAPS Take by mouth. (Patient not taking: Reported on 10/04/2021)    ? ipratropium (ATROVENT) 0.06 % nasal spray INSTILL 2 SPRAYS INTO EACH NOSTRIL AS NEEDED TWICE A DAY (Patient not taking: Reported on 10/04/2021)    ? levothyroxine (SYNTHROID,  LEVOTHROID) 88 MCG tablet Take 88 mcg by mouth daily before breakfast.    ? liothyronine (CYTOMEL) 5 MCG tablet Take 5 mcg by mouth daily. Monday Wednesday Friday    ? nystatin cream (MYCOSTATIN) Apply 1 application topically daily as needed for dry skin. (Patient not taking: Reported on 10/04/2021)    ? olopatadine (PATANOL) 0.1 % ophthalmic solution Apply to eye. (Patient not taking: Reported on  10/04/2021)    ? omeprazole (PRILOSEC) 20 MG capsule     ? ondansetron (ZOFRAN-ODT) 4 MG disintegrating tablet Take 1 tablet (4 mg total) by mouth every 8 (eight) hours as needed for nausea or vomiting. 20 tablet 6  ? Oxycodone HCl 10 MG TABS Take 10 mg by mouth every 6 (six) hours as needed.    ? Polyethyl Glycol-Propyl Glycol 0.4-0.3 % SOLN Apply 1 drop to eye as needed. (Patient not taking: No sig reported)    ? polyethylene glycol (MIRALAX / GLYCOLAX) 17 g packet Take 17 g by mouth 2 (two) times daily. (Patient not taking: Reported on 10/04/2021)    ? potassium chloride (K-DUR,KLOR-CON) 10 MEQ tablet Take 1 tablet by mouth daily.    ? progesterone (PROMETRIUM) 200 MG capsule Take 200 mg by mouth at bedtime.    ? promethazine (PHENERGAN) 12.5 MG tablet Take 1 tablet (12.5 mg total) by mouth every 8 (eight) hours as needed for nausea or vomiting. (Patient not taking: Reported on 10/04/2021) 30 tablet 0  ? rOPINIRole (REQUIP) 2 MG tablet Take 2 mg by mouth 2 (two) times daily.    ? terconazole (TERAZOL 3) 0.8 % vaginal cream Place 1 applicator vaginally at bedtime. (Patient not taking: Reported on 10/04/2021)    ? tiZANidine (ZANAFLEX) 2 MG tablet Take 2 mg by mouth 3 (three) times daily. (Patient not taking: Reported on 10/04/2021)    ? torsemide (DEMADEX) 20 MG tablet Take 20 mg by mouth daily.     ? traZODone (DESYREL) 100 MG tablet Take 2 tablets (200 mg total) by mouth at bedtime. 180 tablet 3  ? Ubrogepant (UBRELVY) 100 MG TABS Take 1 tablet by mouth daily as needed (at onset of headaches may repeat dose after 2 hours as needed max 200 mg in 24 hours). (Patient not taking: Reported on 10/04/2021) 16 tablet 11  ? ?Current Facility-Administered Medications  ?Medication Dose Route Frequency Provider Last Rate Last Admin  ? 0.9 %  sodium chloride infusion  500 mL Intravenous Once Ladene Artist, MD      ? ? ?Medication Side Effects: None ? ?Allergies:  ?Allergies  ?Allergen Reactions  ? Cyclobenzaprine Other (See  Comments)  ?  Other reaction(s): Unknown ?Hallucinations ?Unknown ?  ? Meloxicam Other (See Comments)  ? Naltrexone Other (See Comments)  ?  SEVERE PAIN ALL OVER  ? Pork (Porcine) Protein Other (See Comments)  ?  Other reaction(s): Other (See Comments) ?Migraines ?Other reaction(s): Other (See Comments) ?Migraines  ? Sumatriptan Other (See Comments)  ?  Difficulty breathing ?Other reaction(s): Other (see comments) ?Off balance ?  ? Tape Dermatitis  ? Flexeril [Cyclobenzaprine Hcl]   ? Imitrex [Sumatriptan Base]   ? Other   ? Sulfa Antibiotics   ?  Pt states she does not think she is allergic to Sulfa   ? ? ?Past Medical History:  ?Diagnosis Date  ? Allergic rhinitis   ? Allergy   ? Anemia   ? Anxiety disorder   ? Cancer [facility])   ? skin  ? Chronic fatigue   ?  Chronic headaches   ? Chronic pain   ? Depression   ? Endometriosis   ? Family history of colon cancer   ? mother,uncles,aunts  ? Fibromyalgia   ? Gallstones   ? GERD (gastroesophageal reflux disease)   ? Hypothyroidism   ? IBS (irritable bowel syndrome)   ? Insomnia   ? Iron deficiency   ? Laryngopharyngeal reflux (LPR)   ? Obesity   ? Personal history of colonic polyps 04/2006  ? polypoid  ? PONV (postoperative nausea and vomiting)   ? RLS (restless legs syndrome)   ? Sleep apnea   ? no cpap  ? ? ?Past Medical History, Surgical history, Social history, and Family history were reviewed and updated as appropriate.  ? ?Please see review of systems for further details on the patient's review from today.  ? ?Objective:  ? ?Physical Exam:  ?There were no vitals taken for this visit. ? ?Physical Exam ?Constitutional:   ?   General: She is not in acute distress. ?Musculoskeletal:     ?   General: No deformity.  ?Neurological:  ?   Mental Status: She is alert and oriented to person, place, and time.  ?   Coordination: Coordination normal.  ?Psychiatric:     ?   Attention and Perception: Attention and perception normal. She does not perceive auditory or visual  hallucinations.     ?   Mood and Affect: Mood normal. Mood is not anxious or depressed. Affect is not labile, blunt, angry or inappropriate.     ?   Speech: Speech normal.     ?   Behavior: Behavior normal.     ?

## 2022-04-15 DIAGNOSIS — M47816 Spondylosis without myelopathy or radiculopathy, lumbar region: Secondary | ICD-10-CM | POA: Diagnosis not present

## 2022-04-17 ENCOUNTER — Ambulatory Visit (INDEPENDENT_AMBULATORY_CARE_PROVIDER_SITE_OTHER): Payer: Medicare HMO | Admitting: Psychology

## 2022-04-17 DIAGNOSIS — F4310 Post-traumatic stress disorder, unspecified: Secondary | ICD-10-CM

## 2022-04-18 NOTE — Progress Notes (Signed)
[place] Counselor/Therapist Progress Note  Patient ID: Paige [name], MRN: [id number],    Date: 04/17/2022  Time Spent: 60 minutes  Treatment Type: Individual Therapy  Reported Symptoms: depression and worry  Mental Status Exam: Appearance:  Casual     Behavior: Appropriate  Motor: Normal  Speech/Language:  Normal Rate  Affect: normal  Mood: pleasant  Thought process: normal  Thought content:   WNL  Sensory/Perceptual disturbances:   WNL  Orientation: oriented to person, place, time/date, and situation  Attention: Good  Concentration: Good  Memory: WNL  Fund of knowledge:  Good  Insight:   Good  Judgment:  Fair  Impulse Control: Fair   Risk Assessment: Danger to Self:  No Self-injurious Behavior: No Danger to Others: No Duty to Warn:no Physical Aggression / Violence:No  Access to Firearms a concern: No  Gang Involvement:No   Subjective: The patient attended a face-to-face individual therapy session via video visit.  The patient gave verbal consent for this session to be on video on WebEx.  The patient was in her home alone and therapist was in the office alone.  The patient presents as pleasant and cooperative.  The patient reports that she is having some back issues.  She reports that she tried out for a new play in her area and is going to be in the play in July.  The patient seems very excited about this.  I gave her positive feedback for reaching out and doing something different than she has done in the past.  The patient reports that she is still dating the man she has been dating and they are supposed to go out of town this weekend.  The patient seems to be managing things relatively well and staying pretty stable.  I attempted again to try to get her to spread visits out and she still would like to be seen every 3 weeks so we will continue at the present rate.  Interventions: Cognitive Behavioral Therapy  Diagnosis:PTSD (post-traumatic  stress disorder)  Plan: Treatment Plan  Strengths/Abilities:  Intelligent, kind, motivated  Treatment Preferences:  Outpatient Individual therapy  Statement of Needs:  "I need some help with getting back out into the world and learn how to interact and be social"   Symptoms:  Describes a reliving of the event, particularly through dissociative flashbacks.:  (Status: improved). Displays a significant decline in interest and engagement in activities.: (Status: improved). Displays significant psychological and/or physiological  distress resulting from internal and external clues that are reminiscent of the traumatic event.:  (Status: improved). Experiences disturbances in sleep.:   (Status: improved). Experiences disturbing and persistent thoughts, images, and/or perceptions of the  traumatic event.: (Status: improved). Experiences frequent nightmares.:  (Status: improved). Has been exposed to a traumatic event involving actual or  perceived threat of death or serious injury.: (Status: maintained).  Hypervigilance (e.g., feeling constantly on edge, experiencing concentration difficulties, having trouble falling or staying asleep, exhibiting a general state of irritability).: (Status:  improved). Impairment in social, occupational, or other areas of functioning.:  (Status: improved). Intentionally avoids activities, places, people, or objects (e.g., up-armored vehicles) that evoke memories of the event.:  (Status: improved). Intentionally avoids  thoughts, feelings, or discussions related to the traumatic event.: (Status:  improved). Reports difficulty concentrating as well as feelings of guilt.:   (Status: improved). Reports response of intense fear, helplessness, or horror to the traumatic event.: (Status: improved). Symptoms present more than one month.:  (Status: maintained).    Problems Addressed:  Anxiety, Posttraumatic Stress Disorder (PTSD),  Goals:  LTG:  1. Enhance ability to  effectively cope with the full variety of life's worries  and anxieties.  70% 2. No longer avoids persons, places, activities, and objects that are  reminiscent of the traumatic event.  70% 3. No longer experiences intrusive event recollections, avoidance of event  reminders, intense arousal, or disinterest in activities or  relationships.  70% 4. Stabilize anxiety level while increasing ability to function on a daily  basis.  80% 5. Thinks about or openly discusses the traumatic event with others  without experiencing psychological or physiological distress.  80%  STG: 1.Identify and engage in pleasant activities on a daily basis..  80% 2.Identify, challenge, and replace biased, fearful self-talk with positive, realistic, and empowering self talk 3.  Participate in Cognitive Therapy to help identify, challenge, and replace biased, negative, and selfdefeating thoughts resulting from the trauma.  80 % 4.  Participate in Eye Movement Desensitization and Reprocessing (EMDR) to reduce emotional distress  related to traumatic thoughts, feelings, and images.  90%  Target Date:  02/17/2023 Frequency: Biweekly Modality:Individual Therapy Interventions by Therapist:  CBT, EMDR, insight oriented therapy, problem solving  Patient approved treatment plan and is progressing nicely.  Jenny Omdahl G Josaiah Muhammed, LCSW                        Chayton Murata G Hicks Feick, LCSW               Madelline Eshbach G Ruther Ephraim, LCSW               Selma Mink G Lashawn Bromwell, LCSW               Nabilah Davoli G Pyper Olexa, LCSW               Deen Deguia G Pari Lombard, LCSW               Alveda Vanhorne G Dailon Sheeran, LCSW

## 2022-04-25 ENCOUNTER — Other Ambulatory Visit: Payer: Self-pay | Admitting: Adult Health

## 2022-04-25 DIAGNOSIS — G4700 Insomnia, unspecified: Secondary | ICD-10-CM

## 2022-04-25 DIAGNOSIS — F331 Major depressive disorder, recurrent, moderate: Secondary | ICD-10-CM

## 2022-04-25 DIAGNOSIS — F4310 Post-traumatic stress disorder, unspecified: Secondary | ICD-10-CM

## 2022-04-25 DIAGNOSIS — F411 Generalized anxiety disorder: Secondary | ICD-10-CM

## 2022-04-26 NOTE — Telephone Encounter (Signed)
Verify pharmacy. Local pharmacy has refills available.

## 2022-04-30 DIAGNOSIS — E039 Hypothyroidism, unspecified: Secondary | ICD-10-CM | POA: Diagnosis not present

## 2022-04-30 DIAGNOSIS — Z6835 Body mass index (BMI) 35.0-35.9, adult: Secondary | ICD-10-CM | POA: Diagnosis not present

## 2022-04-30 DIAGNOSIS — M479 Spondylosis, unspecified: Secondary | ICD-10-CM | POA: Diagnosis not present

## 2022-04-30 DIAGNOSIS — M25473 Effusion, unspecified ankle: Secondary | ICD-10-CM | POA: Diagnosis not present

## 2022-04-30 NOTE — Telephone Encounter (Signed)
Called patient and she would like Rx sent to [facility].

## 2022-05-02 ENCOUNTER — Other Ambulatory Visit: Payer: Self-pay

## 2022-05-02 ENCOUNTER — Telehealth: Payer: Self-pay | Admitting: Adult Health

## 2022-05-02 DIAGNOSIS — F411 Generalized anxiety disorder: Secondary | ICD-10-CM

## 2022-05-02 DIAGNOSIS — F331 Major depressive disorder, recurrent, moderate: Secondary | ICD-10-CM

## 2022-05-02 MED ORDER — BUPROPION HCL ER (XL) 150 MG PO TB24: ORAL_TABLET | ORAL | 0 refills | Status: DC

## 2022-05-02 NOTE — Telephone Encounter (Signed)
Rx sent 

## 2022-05-02 NOTE — Telephone Encounter (Signed)
Megan at [facility] 5/31 @ 5:53p.  They have a refill request for the pt for Buproprion '150mg'$ .  Pls call them back at [phone] or fax it to them at [phone].  No upcoming appt scheduled.

## 2022-05-08 ENCOUNTER — Ambulatory Visit (INDEPENDENT_AMBULATORY_CARE_PROVIDER_SITE_OTHER): Payer: Medicare HMO | Admitting: Psychology

## 2022-05-08 DIAGNOSIS — F331 Major depressive disorder, recurrent, moderate: Secondary | ICD-10-CM | POA: Diagnosis not present

## 2022-05-08 DIAGNOSIS — F4310 Post-traumatic stress disorder, unspecified: Secondary | ICD-10-CM | POA: Diagnosis not present

## 2022-05-08 DIAGNOSIS — F411 Generalized anxiety disorder: Secondary | ICD-10-CM | POA: Diagnosis not present

## 2022-05-08 NOTE — Progress Notes (Signed)
[place] [place] Counselor/Therapist Progress Note  Patient ID: LEEANNE BUTTERS, MRN: [id number],    Date: 05/08/2022  Time Spent: 60 minutes  Treatment Type: Individual Therapy  Reported Symptoms: depression and worry  Mental Status Exam: Appearance:  Casual     Behavior: Appropriate  Motor: Normal  Speech/Language:  Normal Rate  Affect: normal  Mood: pleasant  Thought process: normal  Thought content:   WNL  Sensory/Perceptual disturbances:   WNL  Orientation: oriented to person, place, time/date, and situation  Attention: Good  Concentration: Good  Memory: WNL  Fund of knowledge:  Good  Insight:   Good  Judgment:  Fair  Impulse Control: Fair   Risk Assessment: Danger to Self:  No Self-injurious Behavior: No Danger to Others: No Duty to Warn:no Physical Aggression / Violence:No  Access to Firearms a concern: No  Gang Involvement:No   Subjective: The patient attended a face-to-face individual therapy session via video visit.  The patient gave verbal consent for this session to be on video on WebEx.  The patient was in her home alone and therapist was in the office alone.  The patient presents as pleasant and cooperative.  The patient reports that she is having some issues with sleep.  She continues to go to her medication provider and they provide her with trazodone.  She states that she might be sleeping about 4 hours a night.  I recommended that she follow up with her PCP and possibly get another sleep study so that they can consider possibly giving her another CPAP machine.  The patient appears very tired and has difficulty focusing.  The last time that she was dissociating a lot of it was related to her lack of ability to sleep.  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  She is very excited about this and that seems to be going well.  In addition she is still dating a gentleman she has  been dating for about 6 months and they are having a good relationship.  The patient has made excellent progress in therapy however we need to make sure that she is starting to sleep more.  Interventions: Cognitive Behavioral Therapy  Diagnosis:PTSD (post-traumatic stress disorder)  Generalized anxiety disorder  Major depressive disorder, recurrent episode, moderate (HCC)  Plan: Treatment Plan  Strengths/Abilities:  Intelligent, kind, motivated  Treatment Preferences:  Outpatient Individual therapy  Statement of Needs:  "I need some help with getting back out into the world and learn how to interact and be social"   Symptoms:  Describes a reliving of the event, particularly through dissociative flashbacks.:  (Status: improved). Displays a significant decline in interest and engagement in activities.: (Status: improved). Displays significant psychological and/or physiological  distress resulting from internal and external clues that are reminiscent of the traumatic event.:  (Status: improved). Experiences disturbances in sleep.:   (Status: improved). Experiences disturbing and persistent thoughts, images, and/or perceptions of the  traumatic event.: (Status: improved). Experiences frequent nightmares.:  (Status: improved). Has been exposed to a traumatic event involving actual or  perceived threat of death or serious injury.: (Status: maintained).  Hypervigilance (e.g., feeling constantly on edge, experiencing concentration difficulties, having trouble falling or staying asleep, exhibiting a general state of irritability).: (Status:  improved). Impairment in social, occupational, or other areas of functioning.:  (Status: improved). Intentionally avoids activities, places, people, or objects (e.g., up-armored vehicles) that evoke memories of the event.:  (  Status: improved). Intentionally avoids  thoughts, feelings, or discussions related to the traumatic event.: (Status:  improved).  Reports difficulty concentrating as well as feelings of guilt.:   (Status: improved). Reports response of intense fear, helplessness, or horror to the traumatic event.: (Status: improved). Symptoms present more than one month.:  (Status: maintained).    Problems Addressed:  Anxiety, Posttraumatic Stress Disorder (PTSD),  Goals:  LTG:  1. Enhance ability to effectively cope with the full variety of life's worries  and anxieties.  70% 2. No longer avoids persons, places, activities, and objects that are  reminiscent of the traumatic event.  70% 3. No longer experiences intrusive event recollections, avoidance of event  reminders, intense arousal, or disinterest in activities or  relationships.  70% 4. Stabilize anxiety level while increasing ability to function on a daily  basis.  80% 5. Thinks about or openly discusses the traumatic event with others  without experiencing psychological or physiological distress.  80%  STG: 1.Identify and engage in pleasant activities on a daily basis..  80% 2.Identify, challenge, and replace biased, fearful self-talk with positive, realistic, and empowering self talk 3.  Participate in Cognitive Therapy to help identify, challenge, and replace biased, negative, and selfdefeating thoughts resulting from the trauma.  80 % 4.  Participate in Eye Movement Desensitization and Reprocessing (EMDR) to reduce emotional distress  related to traumatic thoughts, feelings, and images.  90%  Target Date:  02/17/2023 Frequency: Biweekly Modality:Individual Therapy Interventions by Therapist:  CBT, EMDR, insight oriented therapy, problem solving  Patient approved treatment plan and is progressing nicely.  Maxim Bedel G Kierre Deines, LCSW                        Deloy Archey G Rashidi Loh, LCSW               Briyonna Omara G Sherill Wegener, LCSW               Jazmen Lindenbaum G Shreyan Hinz, LCSW               Bradly Sangiovanni G Khiem Gargis,  LCSW               Askari Kinley G Nishan Ovens, LCSW               Weslie Rasmus G Benny Deutschman, LCSW               Henleigh Robello G Neftali Abair, LCSW

## 2022-05-20 DIAGNOSIS — M47816 Spondylosis without myelopathy or radiculopathy, lumbar region: Secondary | ICD-10-CM | POA: Diagnosis not present

## 2022-05-21 DIAGNOSIS — R252 Cramp and spasm: Secondary | ICD-10-CM | POA: Diagnosis not present

## 2022-05-21 DIAGNOSIS — R600 Localized edema: Secondary | ICD-10-CM | POA: Diagnosis not present

## 2022-05-21 DIAGNOSIS — G2581 Restless legs syndrome: Secondary | ICD-10-CM | POA: Diagnosis not present

## 2022-05-21 DIAGNOSIS — I87393 Chronic venous hypertension (idiopathic) with other complications of bilateral lower extremity: Secondary | ICD-10-CM | POA: Diagnosis not present

## 2022-05-29 ENCOUNTER — Ambulatory Visit (INDEPENDENT_AMBULATORY_CARE_PROVIDER_SITE_OTHER): Payer: Medicare HMO | Admitting: Psychology

## 2022-05-29 DIAGNOSIS — F4310 Post-traumatic stress disorder, unspecified: Secondary | ICD-10-CM | POA: Diagnosis not present

## 2022-05-29 NOTE — Progress Notes (Signed)
[place] Counselor/Therapist Progress Note  Patient ID: Paige [name], MRN: [id number],    Date: 05/29/2022  Time Spent: 60 minutes  Treatment Type: Individual Therapy  Reported Symptoms: depression and worry  Mental Status Exam: Appearance:  Casual     Behavior: Appropriate  Motor: Normal  Speech/Language:  Normal Rate  Affect: normal  Mood: pleasant  Thought process: normal  Thought content:   WNL  Sensory/Perceptual disturbances:   WNL  Orientation: oriented to person, place, time/date, and situation  Attention: Good  Concentration: Good  Memory: WNL  Fund of knowledge:  Good  Insight:   Good  Judgment:  Fair  Impulse Control: Fair   Risk Assessment: Danger to Self:  No Self-injurious Behavior: No Danger to Others: No Duty to Warn:no Physical Aggression / Violence:No  Access to Firearms a concern: No  Gang Involvement:No   Subjective: The patient attended a face-to-face individual therapy session via video visit.  The patient gave verbal consent for this session to be on video on WebEx.  The patient was in her home alone and therapist was in the office alone.  The patient presents as pleasant and cooperative.  The patient states that she has been doing okay but feels more depressed.  She talked about decreased motivation and we talked about sorting out whether it is a chemical change or whether it is a thought problem.  We discussed this in terms of helping her understand what is going on in her life right now and whether she is catastrophizing or thinking in black and white terms.  I asked her to think about what it is that she causing her to feel sad and if there is nothing in her life that should be causing it then it is likely that it might be a chemical problem.  I also recommended that she check out the physical things that are going on with her such as not wearing a CPAP and not getting enough rest.  Encouraged her to check with her primary  care doctor about this and she also has been taking hormone replacement therapy and has not had that in the last little bit either.  Interventions: Cognitive Behavioral Therapy  Diagnosis:PTSD (post-traumatic stress disorder)  Plan: Treatment Plan  Strengths/Abilities:  Intelligent, kind, motivated  Treatment Preferences:  Outpatient Individual therapy  Statement of Needs:  "I need some help with getting back out into the world and learn how to interact and be social"   Symptoms:  Describes a reliving of the event, particularly through dissociative flashbacks.:  (Status: improved). Displays a significant decline in interest and engagement in activities.: (Status: improved). Displays significant psychological and/or physiological  distress resulting from internal and external clues that are reminiscent of the traumatic event.:  (Status: improved). Experiences disturbances in sleep.:   (Status: improved). Experiences disturbing and persistent thoughts, images, and/or perceptions of the  traumatic event.: (Status: improved). Experiences frequent nightmares.:  (Status: improved). Has been exposed to a traumatic event involving actual or  perceived threat of death or serious injury.: (Status: maintained).  Hypervigilance (e.g., feeling constantly on edge, experiencing concentration difficulties, having trouble falling or staying asleep, exhibiting a general state of irritability).: (Status:  improved). Impairment in social, occupational, or other areas of functioning.:  (Status: improved). Intentionally avoids activities, places, people, or objects (e.g., up-armored vehicles) that evoke memories of the event.:  (Status: improved). Intentionally avoids  thoughts, feelings, or discussions related to the traumatic event.: (Status:  improved). Reports difficulty concentrating as well  as feelings of guilt.:   (Status: improved). Reports response of intense fear, helplessness, or horror to the  traumatic event.: (Status: improved). Symptoms present more than one month.:  (Status: maintained).    Problems Addressed:  Anxiety, Posttraumatic Stress Disorder (PTSD),  Goals:  LTG:  1. Enhance ability to effectively cope with the full variety of life's worries  and anxieties.  70% 2. No longer avoids persons, places, activities, and objects that are  reminiscent of the traumatic event.  70% 3. No longer experiences intrusive event recollections, avoidance of event  reminders, intense arousal, or disinterest in activities or  relationships.  70% 4. Stabilize anxiety level while increasing ability to function on a daily  basis.  80% 5. Thinks about or openly discusses the traumatic event with others  without experiencing psychological or physiological distress.  80%  STG: 1.Identify and engage in pleasant activities on a daily basis..  80% 2.Identify, challenge, and replace biased, fearful self-talk with positive, realistic, and empowering self talk 3.  Participate in Cognitive Therapy to help identify, challenge, and replace biased, negative, and selfdefeating thoughts resulting from the trauma.  80 % 4.  Participate in Eye Movement Desensitization and Reprocessing (EMDR) to reduce emotional distress  related to traumatic thoughts, feelings, and images.  90%  Target Date:  02/17/2023 Frequency: Biweekly Modality:Individual Therapy Interventions by Therapist:  CBT, EMDR, insight oriented therapy, problem solving  Patient approved treatment plan and is progressing nicely.  Merisa Julio G Timmie Calix, LCSW                        Aunna Snooks G Quinteria Chisum, LCSW               Raja Liska G Hermila Millis, LCSW               Darsh Vandevoort G Ilynn Stauffer, LCSW               Alyissa Whidbee G Vantasia Pinkney, LCSW               Obbie Lewallen G Deuntae Kocsis, LCSW               Imani Sherrin G Rudie Sermons, LCSW               Natalia Wittmeyer G Keiona Jenison,  LCSW               Garv Kuechle G Keison Glendinning, LCSW

## 2022-06-04 DIAGNOSIS — S39012A Strain of muscle, fascia and tendon of lower back, initial encounter: Secondary | ICD-10-CM | POA: Diagnosis not present

## 2022-06-04 DIAGNOSIS — M62838 Other muscle spasm: Secondary | ICD-10-CM | POA: Diagnosis not present

## 2022-06-04 DIAGNOSIS — S335XXA Sprain of ligaments of lumbar spine, initial encounter: Secondary | ICD-10-CM | POA: Diagnosis not present

## 2022-06-04 DIAGNOSIS — S161XXA Strain of muscle, fascia and tendon at neck level, initial encounter: Secondary | ICD-10-CM | POA: Diagnosis not present

## 2022-06-04 DIAGNOSIS — S3991XA Unspecified injury of abdomen, initial encounter: Secondary | ICD-10-CM | POA: Diagnosis not present

## 2022-06-04 DIAGNOSIS — S299XXA Unspecified injury of thorax, initial encounter: Secondary | ICD-10-CM | POA: Diagnosis not present

## 2022-06-04 DIAGNOSIS — S139XXA Sprain of joints and ligaments of unspecified parts of neck, initial encounter: Secondary | ICD-10-CM | POA: Diagnosis not present

## 2022-06-04 DIAGNOSIS — S20219A Contusion of unspecified front wall of thorax, initial encounter: Secondary | ICD-10-CM | POA: Diagnosis not present

## 2022-06-04 DIAGNOSIS — S3993XA Unspecified injury of pelvis, initial encounter: Secondary | ICD-10-CM | POA: Diagnosis not present

## 2022-06-04 DIAGNOSIS — S199XXA Unspecified injury of neck, initial encounter: Secondary | ICD-10-CM | POA: Diagnosis not present

## 2022-06-11 ENCOUNTER — Encounter: Payer: Self-pay | Admitting: Adult Health

## 2022-06-11 ENCOUNTER — Ambulatory Visit: Payer: Medicare HMO | Admitting: Adult Health

## 2022-06-11 DIAGNOSIS — N951 Menopausal and female climacteric states: Secondary | ICD-10-CM | POA: Diagnosis not present

## 2022-06-11 DIAGNOSIS — F331 Major depressive disorder, recurrent, moderate: Secondary | ICD-10-CM

## 2022-06-11 DIAGNOSIS — G4700 Insomnia, unspecified: Secondary | ICD-10-CM

## 2022-06-11 DIAGNOSIS — R5383 Other fatigue: Secondary | ICD-10-CM | POA: Diagnosis not present

## 2022-06-11 DIAGNOSIS — F4310 Post-traumatic stress disorder, unspecified: Secondary | ICD-10-CM | POA: Diagnosis not present

## 2022-06-11 DIAGNOSIS — F411 Generalized anxiety disorder: Secondary | ICD-10-CM | POA: Diagnosis not present

## 2022-06-11 MED ORDER — CLONAZEPAM 0.5 MG PO TABS: 0.5000 mg | ORAL_TABLET | Freq: Two times a day (BID) | ORAL | 2 refills | Status: DC | PRN

## 2022-06-11 NOTE — Progress Notes (Signed)
OYINDAMOLA KEY [id number] 08-15-1966 56 y.o.  Subjective:   Patient ID:  Paige [name] is a 56 y.o. (DOB 29-Jul-1966) female.  Chief Complaint: No chief complaint on file.   HPI Paige [name] presents to the office today for follow-up of GAD, MDD, PTSD and insomnia.  Describes mood today as "ok". Pleasant. Tearful at times. Mood symptoms - reports depression, anxiety, and irritability. Mood is lower. Denies mood swings. Stating "I feel like a slug". Missed recent testosterone injection and feels it may be part of her issues. Receives injection every 3 months and feels better when she gets it. Planning to make an appointment today. Feels like medications are helpful. Continues to see therapist Chiropractor) every other week for PTSD. Attends church - singing in the choir. Taking medications as prescribed.  Energy levels lower. Active, walking some days, but less. Enjoys some usual interests and activities. Dating. Has a boyfriend. Lives alone with 2 cats - "Marcello Moores and [employer] and 8 chickens. Mother local and supportive. 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 Appetite adequate. Weight gain - 227 to 235 pounds. Sleeps better some nights than others. Averages 4 to 7 hours. Focus and concentration "horrible". Difficulties getting things done around the house. Disabled since 2009. Denies SI or HI.  Denies AH or VH. Denies self harm. Denies substance use.  Previous medication trials: Lexapro, Paxil, Trazadone, Wellbutrin SR, Geodon, Seroquel, Risperdal, Depakote, Topamax, Lithium, Provigil, Ambien, Cytomel, Clonazepam and other benzodixepaines, Elavil, Effexor, Remeron, Prozac, Anafranil, Tofranil, Zoloft, Geodon, Buspar, Adderall, Ritalin, Abilify, Rexulti, Lamictal, Latuda    Mini-Mental    Flowsheet Row Office Visit from 04/07/2018 in [facility]  Total Score (max 30 points ) 29      PHQ2-9    [facility] from 05/22/2018 in [facility] from 05/13/2018 in [facility] Telephone from 04/30/2018 in [facility]  PHQ-2 Total Score '5 5 2  '$ PHQ-9 Total Score '20 18 13        '$ Review of Systems:  Review of Systems  Musculoskeletal:  Negative for gait problem.  Neurological:  Negative for tremors.  Psychiatric/Behavioral:         Please refer to HPI    Medications: I have reviewed the patient's current medications.  Current Outpatient Medications  Medication Sig Dispense Refill   buPROPion (WELLBUTRIN XL) 150 MG 24 hr tablet TAKE 3 TABLETS EVERY MORNING. 270 tablet 0   cariprazine (VRAYLAR) 3 MG capsule Take 1 capsule (3 mg total) by mouth daily. 90 capsule 3   cetirizine (ZYRTEC) 10 MG tablet Take by mouth. (Patient not taking: Reported on 10/04/2021)     clonazePAM (KLONOPIN) 0.5 MG tablet Take 1 tablet (0.5 mg total) by mouth 2 (two) times daily as needed for anxiety. 30 tablet 2   dicyclomine (BENTYL) 10 MG capsule Take 1 capsule (10 mg total) by mouth 3 (three) times daily as needed for spasms. (Patient not taking: Reported on 10/04/2021) 90 capsule 11   DOTTI 0.0375 MG/24HR Place onto the skin.     DULoxetine (CYMBALTA) 60 MG capsule TAKE 1 CAPSULE TWICE DAILY 180 capsule 0   Erenumab-aooe (AIMOVIG) 70 MG/ML SOAJ INJECT 70 MG INTO THE SKIN EVERY 28 DAYS. 1 mL 11   Fe Fum-Fe Poly-Vit C-Lactobac (FUSION) 65-65-25-30 MG CAPS Take by mouth. (Patient not taking: Reported on 10/04/2021)     ipratropium (ATROVENT) 0.06 % nasal spray INSTILL 2 SPRAYS INTO EACH NOSTRIL AS NEEDED  TWICE A DAY (Patient not taking: Reported on 10/04/2021)     levothyroxine (SYNTHROID, LEVOTHROID) 88 MCG tablet Take 88 mcg by mouth daily before breakfast.     liothyronine (CYTOMEL) 5 MCG tablet Take 5 mcg by mouth daily. Monday Wednesday Friday     nystatin cream (MYCOSTATIN) Apply 1 application topically daily as needed for dry skin. (Patient not taking: Reported on 10/04/2021)      olopatadine (PATANOL) 0.1 % ophthalmic solution Apply to eye. (Patient not taking: Reported on 10/04/2021)     omeprazole (PRILOSEC) 20 MG capsule      ondansetron (ZOFRAN-ODT) 4 MG disintegrating tablet Take 1 tablet (4 mg total) by mouth every 8 (eight) hours as needed for nausea or vomiting. 20 tablet 6   Oxycodone HCl 10 MG TABS Take 10 mg by mouth every 6 (six) hours as needed.     Polyethyl Glycol-Propyl Glycol 0.4-0.3 % SOLN Apply 1 drop to eye as needed. (Patient not taking: No sig reported)     polyethylene glycol (MIRALAX / GLYCOLAX) 17 g packet Take 17 g by mouth 2 (two) times daily. (Patient not taking: Reported on 10/04/2021)     potassium chloride (K-DUR,KLOR-CON) 10 MEQ tablet Take 1 tablet by mouth daily.     progesterone (PROMETRIUM) 200 MG capsule Take 200 mg by mouth at bedtime.     promethazine (PHENERGAN) 12.5 MG tablet Take 1 tablet (12.5 mg total) by mouth every 8 (eight) hours as needed for nausea or vomiting. (Patient not taking: Reported on 10/04/2021) 30 tablet 0   rOPINIRole (REQUIP) 2 MG tablet Take 2 mg by mouth 2 (two) times daily.     terconazole (TERAZOL 3) 0.8 % vaginal cream Place 1 applicator vaginally at bedtime. (Patient not taking: Reported on 10/04/2021)     tiZANidine (ZANAFLEX) 2 MG tablet Take 2 mg by mouth 3 (three) times daily. (Patient not taking: Reported on 10/04/2021)     torsemide (DEMADEX) 20 MG tablet Take 20 mg by mouth daily.      traZODone (DESYREL) 100 MG tablet Take 2 tablets (200 mg total) by mouth at bedtime. 180 tablet 3   Ubrogepant (UBRELVY) 100 MG TABS Take 1 tablet by mouth daily as needed (at onset of headaches may repeat dose after 2 hours as needed max 200 mg in 24 hours). (Patient not taking: Reported on 10/04/2021) 16 tablet 11   Current Facility-Administered Medications  Medication Dose Route Frequency Provider Last Rate Last Admin   0.9 %  sodium chloride infusion  500 mL Intravenous Once Ladene Artist, MD        Medication Side  Effects: None  Allergies:  Allergies  Allergen Reactions   Cyclobenzaprine Other (See Comments)    Other reaction(s): Unknown Hallucinations Unknown    Meloxicam Other (See Comments)   Naltrexone Other (See Comments)    SEVERE PAIN ALL OVER   Pork (Porcine) Protein Other (See Comments)    Other reaction(s): Other (See Comments) Migraines Other reaction(s): Other (See Comments) Migraines   Sumatriptan Other (See Comments)    Difficulty breathing Other reaction(s): Other (see comments) Off balance    Tape Dermatitis   Flexeril [Cyclobenzaprine Hcl]    Imitrex [Sumatriptan Base]    Other    Sulfa Antibiotics     Pt states she does not think she is allergic to Sulfa     Past Medical History:  Diagnosis Date   Allergic rhinitis    Allergy    Anemia    Anxiety  disorder    Cancer (HCC)    skin   Chronic fatigue    Chronic headaches    Chronic pain    Depression    Endometriosis    Family history of colon cancer    mother,uncles,aunts   Fibromyalgia    Gallstones    GERD (gastroesophageal reflux disease)    Hypothyroidism    IBS (irritable bowel syndrome)    Insomnia    Iron deficiency    Laryngopharyngeal reflux (LPR)    Obesity    Personal history of colonic polyps 04/2006   polypoid   PONV (postoperative nausea and vomiting)    RLS (restless legs syndrome)    Sleep apnea    no cpap    Past Medical History, Surgical history, Social history, and Family history were reviewed and updated as appropriate.   Please see review of systems for further details on the patient's review from today.   Objective:   Physical Exam:  There were no vitals taken for this visit.  Physical Exam Constitutional:      General: She is not in acute distress. Musculoskeletal:        General: No deformity.  Neurological:     Mental Status: She is alert and oriented to person, place, and time.     Coordination: Coordination normal.  Psychiatric:        Attention and  Perception: Attention and perception normal. She does not perceive auditory or visual hallucinations.        Mood and Affect: Mood normal. Mood is not anxious or depressed. Affect is not labile, blunt, angry or inappropriate.        Speech: Speech normal.        Behavior: Behavior normal.        Thought Content: Thought content normal. Thought content is not paranoid or delusional. Thought content does not include homicidal or suicidal ideation. Thought content does not include homicidal or suicidal plan.        Cognition and Memory: Cognition and memory normal.        Judgment: Judgment normal.     Comments: Insight intact     Lab Review:     Component Value Date/Time   NA 140 05/26/2019 1046   K 3.4 (L) 05/26/2019 1046   CL 95 (L) 05/26/2019 1046   CO2 37 (H) 05/26/2019 1046   GLUCOSE 97 05/26/2019 1046   BUN 17 05/26/2019 1046   CREATININE 1.09 05/26/2019 1046   CALCIUM 9.3 05/26/2019 1046   PROT 6.9 05/26/2019 1046   ALBUMIN 4.2 05/26/2019 1046   AST 36 05/26/2019 1046   ALT 30 05/26/2019 1046   ALKPHOS 98 05/26/2019 1046   BILITOT 0.4 05/26/2019 1046       Component Value Date/Time   WBC 6.9 05/26/2019 1046   RBC 4.42 05/26/2019 1046   HGB 12.6 05/26/2019 1046   HCT 38.7 05/26/2019 1046   PLT 209.0 05/26/2019 1046   MCV 87.6 05/26/2019 1046   MCHC 32.5 05/26/2019 1046   RDW 14.6 05/26/2019 1046   LYMPHSABS 2.0 05/26/2019 1046   MONOABS 0.6 05/26/2019 1046   EOSABS 0.1 05/26/2019 1046   BASOSABS 0.0 05/26/2019 1046    No results found for: "POCLITH", "LITHIUM"   No results found for: "PHENYTOIN", "PHENOBARB", "VALPROATE", "CBMZ"   .res Assessment: Plan:    Plan:  1. Wellbutrin XL '450mg'$  daily - in the am  2. Cymbalta '60mg'$  BID 3. Trazadone '100mg'$  - 2 at hs  4. Vraylar  $'3mg'g$  daily though patient assistance. 5. Clonazepam 0.'5mg'$  BID  Continue therapy with Bambi Cottle.   RTC 4/6 weeks  Patient advised to contact office with any questions, adverse effects,  or acute worsening in signs and symptoms.  Discussed potential metabolic side effects associated with atypical antipsychotics, as well as potential risk for movement side effects. Advised pt to contact office if movement side effects occur.   Diagnoses and all orders for this visit:  PTSD (post-traumatic stress disorder)  Generalized anxiety disorder -     clonazePAM (KLONOPIN) 0.5 MG tablet; Take 1 tablet (0.5 mg total) by mouth 2 (two) times daily as needed for anxiety.  Major depressive disorder, recurrent episode, moderate (HCC)  Insomnia, unspecified type     Please see After Visit Summary for patient specific instructions.  Future Appointments  Date Time Provider [place]  06/19/2022  2:00 PM Cottle, Lucious Groves, LCSW [facility] None  07/10/2022  2:00 PM Cottle, Bambi G, LCSW [facility] None  07/23/2022  8:40 AM Adi Doro, Berdie Ogren, NP [facility] None  07/31/2022  2:00 PM Cottle, Bambi G, LCSW [facility] None  08/21/2022  2:00 PM Cottle, Bambi G, LCSW [facility] None  09/10/2022  9:50 AM Tomi Likens, Adam R, DO [facility] None  09/11/2022  2:00 PM Cottle, Bambi G, LCSW [facility] None  10/02/2022  2:00 PM Cottle, Bambi G, LCSW [facility] None  10/23/2022  2:00 PM Cottle, Bambi G, LCSW [facility] None  11/13/2022  2:00 PM Cottle, Bambi G, LCSW [facility] None    No orders of the defined types were placed in this encounter.   -------------------------------

## 2022-06-13 DIAGNOSIS — Z6836 Body mass index (BMI) 36.0-36.9, adult: Secondary | ICD-10-CM | POA: Diagnosis not present

## 2022-06-13 DIAGNOSIS — F331 Major depressive disorder, recurrent, moderate: Secondary | ICD-10-CM | POA: Diagnosis not present

## 2022-06-13 DIAGNOSIS — N951 Menopausal and female climacteric states: Secondary | ICD-10-CM | POA: Diagnosis not present

## 2022-06-13 DIAGNOSIS — R6882 Decreased libido: Secondary | ICD-10-CM | POA: Diagnosis not present

## 2022-06-13 DIAGNOSIS — N898 Other specified noninflammatory disorders of vagina: Secondary | ICD-10-CM | POA: Diagnosis not present

## 2022-06-17 DIAGNOSIS — M7989 Other specified soft tissue disorders: Secondary | ICD-10-CM | POA: Diagnosis not present

## 2022-06-17 DIAGNOSIS — M79604 Pain in right leg: Secondary | ICD-10-CM | POA: Diagnosis not present

## 2022-06-17 DIAGNOSIS — M545 Low back pain, unspecified: Secondary | ICD-10-CM | POA: Diagnosis not present

## 2022-06-17 DIAGNOSIS — M47816 Spondylosis without myelopathy or radiculopathy, lumbar region: Secondary | ICD-10-CM | POA: Diagnosis not present

## 2022-06-17 DIAGNOSIS — M79605 Pain in left leg: Secondary | ICD-10-CM | POA: Diagnosis not present

## 2022-06-19 ENCOUNTER — Ambulatory Visit (INDEPENDENT_AMBULATORY_CARE_PROVIDER_SITE_OTHER): Payer: Medicare HMO | Admitting: Psychology

## 2022-06-19 DIAGNOSIS — F4310 Post-traumatic stress disorder, unspecified: Secondary | ICD-10-CM

## 2022-06-19 NOTE — Progress Notes (Signed)
[facility] Counselor/Therapist Progress Note  Patient ID: Paige [name], MRN: [id number],    Date: 06/19/2022  Time Spent: 60 minutes  Treatment Type: Individual Therapy  Reported Symptoms: depression and worry  Mental Status Exam: Appearance:  Casual     Behavior: Appropriate  Motor: Normal  Speech/Language:  Normal Rate  Affect: normal  Mood: pleasant  Thought process: normal  Thought content:   WNL  Sensory/Perceptual disturbances:   WNL  Orientation: oriented to person, place, time/date, and situation  Attention: Good  Concentration: Good  Memory: WNL  Fund of knowledge:  Good  Insight:   Good  Judgment:  Fair  Impulse Control: Fair   Risk Assessment: Danger to Self:  No Self-injurious Behavior: No Danger to Others: No Duty to Warn:no Physical Aggression / Violence:No  Access to Firearms a concern: No  Gang Involvement:No   Subjective: The patient attended a face-to-face individual therapy session via video visit.  The patient gave verbal consent for this session to be on video on WebEx.  The patient was in her home alone and therapist was in the office alone.  The patient presents as pleasant and cooperative.  The patient reports that she and her boyfriend are having difficulty with their relationship.  We processed what was happening and it seems that he is not being very communicative and not taking her emotions into account.  We talked about her continuing to give it a little more time but also making the decision not to continue the relationship if it is not good for her.  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  The patient is doing well and is doing better in regard to setting limits for herself.  Interventions: Cognitive Behavioral Therapy  Diagnosis:PTSD (post-traumatic stress disorder)  Plan: Treatment Plan  Strengths/Abilities:  Intelligent, kind, motivated  Treatment  Preferences:  Outpatient Individual therapy  Statement of Needs:  "I need some help with getting back out into the world and learn how to interact and be social"   Symptoms:  Describes a reliving of the event, particularly through dissociative flashbacks.:  (Status: improved). Displays a significant decline in interest and engagement in activities.: (Status: improved). Displays significant psychological and/or physiological  distress resulting from internal and external clues that are reminiscent of the traumatic event.:  (Status: improved). Experiences disturbances in sleep.:   (Status: improved). Experiences disturbing and persistent thoughts, images, and/or perceptions of the  traumatic event.: (Status: improved). Experiences frequent nightmares.:  (Status: improved). Has been exposed to a traumatic event involving actual or  perceived threat of death or serious injury.: (Status: maintained).  Hypervigilance (e.g., feeling constantly on edge, experiencing concentration difficulties, having trouble falling or staying asleep, exhibiting a general state of irritability).: (Status:  improved). Impairment in social, occupational, or other areas of functioning.:  (Status: improved). Intentionally avoids activities, places, people, or objects (e.g., up-armored vehicles) that evoke memories of the event.:  (Status: improved). Intentionally avoids  thoughts, feelings, or discussions related to the traumatic event.: (Status:  improved). Reports difficulty concentrating as well as feelings of guilt.:   (Status: improved). Reports response of intense fear, helplessness, or horror to the traumatic event.: (Status: improved). Symptoms present more than one month.:  (Status: maintained).    Problems Addressed:  Anxiety, Posttraumatic Stress Disorder (PTSD),  Goals:  LTG:  1. Enhance ability to effectively cope with the full variety of life's worries  and anxieties.  70% 2.  No longer avoids persons,  places, activities, and objects that are  reminiscent of the traumatic event.  70% 3. No longer experiences intrusive event recollections, avoidance of event  reminders, intense arousal, or disinterest in activities or  relationships.  70% 4. Stabilize anxiety level while increasing ability to function on a daily  basis.  80% 5. Thinks about or openly discusses the traumatic event with others  without experiencing psychological or physiological distress.  80%  STG: 1.Identify and engage in pleasant activities on a daily basis..  80% 2.Identify, challenge, and replace biased, fearful self-talk with positive, realistic, and empowering self talk 3.  Participate in Cognitive Therapy to help identify, challenge, and replace biased, negative, and selfdefeating thoughts resulting from the trauma.  80 % 4.  Participate in Eye Movement Desensitization and Reprocessing (EMDR) to reduce emotional distress  related to traumatic thoughts, feelings, and images.  90%  Target Date:  02/17/2023 Frequency: Biweekly Modality:Individual Therapy Interventions by Therapist:  CBT, EMDR, insight oriented therapy, problem solving  Patient approved treatment plan and is progressing nicely.  Paige Leeson G Darnita Woodrum, LCSW                        Paige Tienda G Vee Bahe, LCSW               Paige Geraci G Athens Lebeau, LCSW               Paige Capp G Calyse Murcia, LCSW               Paige Lona G Roxi Hlavaty, LCSW               Paige Walla G Tyhir Schwan, LCSW               Paige Twiford G Aveena Bari, LCSW               Paige Delancey G Rajean Desantiago, LCSW               Paige Jared G Shaquina Gillham, LCSW               Lemond Griffee G Kenyatta Gloeckner, LCSW

## 2022-06-26 DIAGNOSIS — G2581 Restless legs syndrome: Secondary | ICD-10-CM | POA: Diagnosis not present

## 2022-06-26 DIAGNOSIS — I872 Venous insufficiency (chronic) (peripheral): Secondary | ICD-10-CM | POA: Diagnosis not present

## 2022-06-26 DIAGNOSIS — R600 Localized edema: Secondary | ICD-10-CM | POA: Diagnosis not present

## 2022-06-26 DIAGNOSIS — R252 Cramp and spasm: Secondary | ICD-10-CM | POA: Diagnosis not present

## 2022-06-27 DIAGNOSIS — G4733 Obstructive sleep apnea (adult) (pediatric): Secondary | ICD-10-CM | POA: Diagnosis not present

## 2022-06-27 DIAGNOSIS — I890 Lymphedema, not elsewhere classified: Secondary | ICD-10-CM | POA: Diagnosis not present

## 2022-06-27 DIAGNOSIS — E162 Hypoglycemia, unspecified: Secondary | ICD-10-CM | POA: Diagnosis not present

## 2022-06-27 DIAGNOSIS — E039 Hypothyroidism, unspecified: Secondary | ICD-10-CM | POA: Diagnosis not present

## 2022-06-27 DIAGNOSIS — M549 Dorsalgia, unspecified: Secondary | ICD-10-CM | POA: Diagnosis not present

## 2022-06-27 DIAGNOSIS — K219 Gastro-esophageal reflux disease without esophagitis: Secondary | ICD-10-CM | POA: Diagnosis not present

## 2022-06-27 DIAGNOSIS — M479 Spondylosis, unspecified: Secondary | ICD-10-CM | POA: Diagnosis not present

## 2022-06-27 DIAGNOSIS — Z6837 Body mass index (BMI) 37.0-37.9, adult: Secondary | ICD-10-CM | POA: Diagnosis not present

## 2022-07-08 DIAGNOSIS — D259 Leiomyoma of uterus, unspecified: Secondary | ICD-10-CM | POA: Diagnosis not present

## 2022-07-08 DIAGNOSIS — N950 Postmenopausal bleeding: Secondary | ICD-10-CM | POA: Diagnosis not present

## 2022-07-10 ENCOUNTER — Ambulatory Visit (INDEPENDENT_AMBULATORY_CARE_PROVIDER_SITE_OTHER): Payer: Medicare HMO | Admitting: Psychology

## 2022-07-10 DIAGNOSIS — E162 Hypoglycemia, unspecified: Secondary | ICD-10-CM | POA: Diagnosis not present

## 2022-07-10 DIAGNOSIS — F4310 Post-traumatic stress disorder, unspecified: Secondary | ICD-10-CM | POA: Diagnosis not present

## 2022-07-10 DIAGNOSIS — Z79899 Other long term (current) drug therapy: Secondary | ICD-10-CM | POA: Diagnosis not present

## 2022-07-10 DIAGNOSIS — E785 Hyperlipidemia, unspecified: Secondary | ICD-10-CM | POA: Diagnosis not present

## 2022-07-10 NOTE — Progress Notes (Signed)
[facility] Counselor/Therapist Progress Note  Patient ID: Paige [name], MRN: [id number],    Date: 07/10/2022  Time Spent: 60 minutes  Treatment Type: Individual Therapy  Reported Symptoms: depression and worry  Mental Status Exam: Appearance:  Casual     Behavior: Appropriate  Motor: Normal  Speech/Language:  Normal Rate  Affect: normal  Mood: pleasant  Thought process: normal  Thought content:   WNL  Sensory/Perceptual disturbances:   WNL  Orientation: oriented to person, place, time/date, and situation  Attention: Good  Concentration: Good  Memory: WNL  Fund of knowledge:  Good  Insight:   Good  Judgment:  Fair  Impulse Control: Fair   Risk Assessment: Danger to Self:  No Self-injurious Behavior: No Danger to Others: No Duty to Warn:no Physical Aggression / Violence:No  Access to Firearms a concern: No  Gang Involvement:No   Subjective: The patient attended a face-to-face individual therapy session via video visit.  The patient gave verbal consent for this session to be on video on WebEx.  The patient was in her home alone and therapist was in the office alone.  The patient presents as a little depressed today.  The patient reports that she and her boyfriend broke up and he is coming back tomorrow to get his things.  We processed how she felt about this and it does seem that this is a good decision on both of their parts as they are not very compatible.  The patient did acknowledge her sadness about it but does feel that it would probably be best that they not have a relationship any longer.  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  She also was instrumental in getting her mother to the hospital when she was having a heart attack.  Encouraged the patient to do some good things for herself and gave her some resources that she might be able to tap into.  We will see her in  about 3 weeks.  Interventions: Cognitive Behavioral Therapy  Diagnosis:PTSD (post-traumatic stress disorder)  Plan: Treatment Plan  Strengths/Abilities:  Intelligent, kind, motivated  Treatment Preferences:  Outpatient Individual therapy  Statement of Needs:  "I need some help with getting back out into the world and learn how to interact and be social"   Symptoms:  Describes a reliving of the event, particularly through dissociative flashbacks.:  (Status: improved). Displays a significant decline in interest and engagement in activities.: (Status: improved). Displays significant psychological and/or physiological  distress resulting from internal and external clues that are reminiscent of the traumatic event.:  (Status: improved). Experiences disturbances in sleep.:   (Status: improved). Experiences disturbing and persistent thoughts, images, and/or perceptions of the  traumatic event.: (Status: improved). Experiences frequent nightmares.:  (Status: improved). Has been exposed to a traumatic event involving actual or  perceived threat of death or serious injury.: (Status: maintained).  Hypervigilance (e.g., feeling constantly on edge, experiencing concentration difficulties, having trouble falling or staying asleep, exhibiting a general state of irritability).: (Status:  improved). Impairment in social, occupational, or other areas of functioning.:  (Status: improved). Intentionally avoids activities, places, people, or objects (e.g., up-armored vehicles) that evoke memories of the event.:  (Status: improved). Intentionally avoids  thoughts, feelings, or discussions related to the traumatic event.: (Status:  improved). Reports difficulty concentrating as well as feelings of guilt.:   (Status: improved). Reports response of intense fear, helplessness, or horror to the  traumatic event.: (Status: improved). Symptoms present more than one month.:  (Status: maintained).    Problems  Addressed:  Anxiety, Posttraumatic Stress Disorder (PTSD),  Goals:  LTG:  1. Enhance ability to effectively cope with the full variety of life's worries  and anxieties.  70% 2. No longer avoids persons, places, activities, and objects that are  reminiscent of the traumatic event.  70% 3. No longer experiences intrusive event recollections, avoidance of event  reminders, intense arousal, or disinterest in activities or  relationships.  70% 4. Stabilize anxiety level while increasing ability to function on a daily  basis.  80% 5. Thinks about or openly discusses the traumatic event with others  without experiencing psychological or physiological distress.  80%  STG: 1.Identify and engage in pleasant activities on a daily basis..  80% 2.Identify, challenge, and replace biased, fearful self-talk with positive, realistic, and empowering self talk 3.  Participate in Cognitive Therapy to help identify, challenge, and replace biased, negative, and selfdefeating thoughts resulting from the trauma.  80 % 4.  Participate in Eye Movement Desensitization and Reprocessing (EMDR) to reduce emotional distress  related to traumatic thoughts, feelings, and images.  90%  Target Date:  02/17/2023 Frequency: Biweekly Modality:Individual Therapy Interventions by Therapist:  CBT, EMDR, insight oriented therapy, problem solving  Patient approved treatment plan and is progressing nicely.  Sehar Sedano G Janilah Hojnacki, LCSW                        Hamsa Laurich G Zurich Carreno, LCSW               Nikhil Osei G Ezekiel Menzer, LCSW               Damarian Priola G Ronalee Scheunemann, LCSW               Herby Amick G Snow Peoples, LCSW               Deleah Tison G Riyanshi Wahab, LCSW               Hilbert Briggs G Kamaljit Hizer, LCSW               Vanden Fawaz G Annaleigh Steinmeyer, LCSW               Audie Wieser G Omeed Osuna, LCSW               Zephan Beauchaine G Lonny Eisen, LCSW               Renuka Farfan G Beckey Polkowski,  LCSW

## 2022-07-12 DIAGNOSIS — Z113 Encounter for screening for infections with a predominantly sexual mode of transmission: Secondary | ICD-10-CM | POA: Diagnosis not present

## 2022-07-12 DIAGNOSIS — N950 Postmenopausal bleeding: Secondary | ICD-10-CM | POA: Diagnosis not present

## 2022-07-15 ENCOUNTER — Encounter: Payer: Self-pay | Admitting: Physical Medicine and Rehabilitation

## 2022-07-19 ENCOUNTER — Other Ambulatory Visit: Payer: Self-pay | Admitting: Adult Health

## 2022-07-19 DIAGNOSIS — F331 Major depressive disorder, recurrent, moderate: Secondary | ICD-10-CM

## 2022-07-19 DIAGNOSIS — F411 Generalized anxiety disorder: Secondary | ICD-10-CM

## 2022-07-23 ENCOUNTER — Encounter: Payer: Self-pay | Admitting: Adult Health

## 2022-07-23 ENCOUNTER — Ambulatory Visit: Payer: Medicare HMO | Admitting: Adult Health

## 2022-07-23 DIAGNOSIS — F411 Generalized anxiety disorder: Secondary | ICD-10-CM | POA: Diagnosis not present

## 2022-07-23 DIAGNOSIS — F4310 Post-traumatic stress disorder, unspecified: Secondary | ICD-10-CM | POA: Diagnosis not present

## 2022-07-23 DIAGNOSIS — G4700 Insomnia, unspecified: Secondary | ICD-10-CM | POA: Diagnosis not present

## 2022-07-23 DIAGNOSIS — F331 Major depressive disorder, recurrent, moderate: Secondary | ICD-10-CM

## 2022-07-23 MED ORDER — BUPROPION HCL ER (XL) 150 MG PO TB24: ORAL_TABLET | ORAL | 3 refills | Status: DC

## 2022-07-23 MED ORDER — DULOXETINE HCL 60 MG PO CPEP: 60.0000 mg | ORAL_CAPSULE | Freq: Two times a day (BID) | ORAL | 3 refills | Status: DC

## 2022-07-23 NOTE — Progress Notes (Signed)
Paige [name] [id number] 20-Jun-1966 56 y.o.  Subjective:   Patient ID:  Paige [name] is a 56 y.o. (DOB 1966-03-23) female.  Chief Complaint: No chief complaint on file.   HPI Paige [name] presents to the office today for follow-up of GAD, MDD, PTSD and insomnia.  Describes mood today as "not the best". Pleasant. Tearful at times. Mood symptoms - reports depression, anxiety, and irritability.Reports worry and rumination. Mood is lower. Stating "I'm not doing so well". Increased situational stressors. Stating "things have been topsy turvy". She and boyfriend have broken up. Mother had a "heart attack" - recovering. Feels like medications are helpful. Continues to see therapist Chiropractor)  - every 3 weeks. Attends church - singing in the choir. Recently participated in the [employer] production.Taking medications as prescribed.  Energy levels lower. Active, walking some days. Enjoys some usual interests and activities. Single. Lives alone with 2 cats - "Marcello Moores and [employer] and 8 chickens (laying eggs). Mother local and supportive.  Appetite adequate. Weight stable - 235 pounds. Sleeps better some nights than others. Averages 5 to 6 hours. Focus and concentration "cruddy". Difficulties getting things done around the house - "doing a little bit more". Disabled since 2009. Denies SI or HI.  Denies AH or VH. Denies self harm. Denies substance use.  Previous medication trials: Lexapro, Paxil, Trazadone, Wellbutrin SR, Geodon, Seroquel, Risperdal, Depakote, Topamax, Lithium, Provigil, Ambien, Cytomel, Clonazepam and other benzodixepaines, Elavil, Effexor, Remeron, Prozac, Anafranil, Tofranil, Zoloft, Geodon, Buspar, Adderall, Ritalin, Abilify, Rexulti, Lamictal, Latuda     Mini-Mental    Flowsheet Row Office Visit from 04/07/2018 in [facility]  Total Score (max 30 points ) 29      PHQ2-9    [facility] from 05/22/2018 in [facility] Outreach from 05/13/2018 in [facility] Telephone from 04/30/2018 in [facility]  PHQ-2 Total Score '5 5 2  '$ PHQ-9 Total Score '20 18 13        '$ Review of Systems:  Review of Systems  Musculoskeletal:  Negative for gait problem.  Neurological:  Negative for tremors.  Psychiatric/Behavioral:         Please refer to HPI    Medications: I have reviewed the patient's current medications.  Current Outpatient Medications  Medication Sig Dispense Refill   buPROPion (WELLBUTRIN XL) 150 MG 24 hr tablet TAKE 3 TABLETS EVERY MORNING 270 tablet 0   cariprazine (VRAYLAR) 3 MG capsule Take 1 capsule (3 mg total) by mouth daily. 90 capsule 3   cetirizine (ZYRTEC) 10 MG tablet Take by mouth. (Patient not taking: Reported on 10/04/2021)     clonazePAM (KLONOPIN) 0.5 MG tablet Take 1 tablet (0.5 mg total) by mouth 2 (two) times daily as needed for anxiety. 30 tablet 2   dicyclomine (BENTYL) 10 MG capsule Take 1 capsule (10 mg total) by mouth 3 (three) times daily as needed for spasms. (Patient not taking: Reported on 10/04/2021) 90 capsule 11   DOTTI 0.0375 MG/24HR Place onto the skin.     DULoxetine (CYMBALTA) 60 MG capsule TAKE 1 CAPSULE TWICE DAILY 180 capsule 0   Erenumab-aooe (AIMOVIG) 70 MG/ML SOAJ INJECT 70 MG INTO THE SKIN EVERY 28 DAYS. 1 mL 11   Fe Fum-Fe Poly-Vit C-Lactobac (FUSION) 65-65-25-30 MG CAPS Take by mouth. (Patient not taking: Reported on 10/04/2021)     ipratropium (ATROVENT) 0.06 % nasal spray INSTILL 2 SPRAYS INTO EACH NOSTRIL AS NEEDED TWICE A DAY (Patient not  taking: Reported on 10/04/2021)     levothyroxine (SYNTHROID, LEVOTHROID) 88 MCG tablet Take 88 mcg by mouth daily before breakfast.     liothyronine (CYTOMEL) 5 MCG tablet Take 5 mcg by mouth daily. Monday Wednesday Friday     nystatin cream (MYCOSTATIN) Apply 1 application topically daily as needed for dry skin. (Patient not taking: Reported on 10/04/2021)      olopatadine (PATANOL) 0.1 % ophthalmic solution Apply to eye. (Patient not taking: Reported on 10/04/2021)     omeprazole (PRILOSEC) 20 MG capsule      ondansetron (ZOFRAN-ODT) 4 MG disintegrating tablet Take 1 tablet (4 mg total) by mouth every 8 (eight) hours as needed for nausea or vomiting. 20 tablet 6   Oxycodone HCl 10 MG TABS Take 10 mg by mouth every 6 (six) hours as needed.     Polyethyl Glycol-Propyl Glycol 0.4-0.3 % SOLN Apply 1 drop to eye as needed. (Patient not taking: No sig reported)     polyethylene glycol (MIRALAX / GLYCOLAX) 17 g packet Take 17 g by mouth 2 (two) times daily. (Patient not taking: Reported on 10/04/2021)     potassium chloride (K-DUR,KLOR-CON) 10 MEQ tablet Take 1 tablet by mouth daily.     progesterone (PROMETRIUM) 200 MG capsule Take 200 mg by mouth at bedtime.     promethazine (PHENERGAN) 12.5 MG tablet Take 1 tablet (12.5 mg total) by mouth every 8 (eight) hours as needed for nausea or vomiting. (Patient not taking: Reported on 10/04/2021) 30 tablet 0   rOPINIRole (REQUIP) 2 MG tablet Take 2 mg by mouth 2 (two) times daily.     terconazole (TERAZOL 3) 0.8 % vaginal cream Place 1 applicator vaginally at bedtime. (Patient not taking: Reported on 10/04/2021)     tiZANidine (ZANAFLEX) 2 MG tablet Take 2 mg by mouth 3 (three) times daily. (Patient not taking: Reported on 10/04/2021)     torsemide (DEMADEX) 20 MG tablet Take 20 mg by mouth daily.      traZODone (DESYREL) 100 MG tablet Take 2 tablets (200 mg total) by mouth at bedtime. 180 tablet 3   Ubrogepant (UBRELVY) 100 MG TABS Take 1 tablet by mouth daily as needed (at onset of headaches may repeat dose after 2 hours as needed max 200 mg in 24 hours). (Patient not taking: Reported on 10/04/2021) 16 tablet 11   Current Facility-Administered Medications  Medication Dose Route Frequency Provider Last Rate Last Admin   0.9 %  sodium chloride infusion  500 mL Intravenous Once Ladene Artist, MD        Medication Side  Effects: None  Allergies:  Allergies  Allergen Reactions   Cyclobenzaprine Other (See Comments)    Other reaction(s): Unknown Hallucinations Unknown    Meloxicam Other (See Comments)   Naltrexone Other (See Comments)    SEVERE PAIN ALL OVER   Pork (Porcine) Protein Other (See Comments)    Other reaction(s): Other (See Comments) Migraines Other reaction(s): Other (See Comments) Migraines   Sumatriptan Other (See Comments)    Difficulty breathing Other reaction(s): Other (see comments) Off balance    Tape Dermatitis   Flexeril [Cyclobenzaprine Hcl]    Imitrex [Sumatriptan Base]    Other    Sulfa Antibiotics     Pt states she does not think she is allergic to Sulfa     Past Medical History:  Diagnosis Date   Allergic rhinitis    Allergy    Anemia    Anxiety disorder    Cancer (  HCC)    skin   Chronic fatigue    Chronic headaches    Chronic pain    Depression    Endometriosis    Family history of colon cancer    mother,uncles,aunts   Fibromyalgia    Gallstones    GERD (gastroesophageal reflux disease)    Hypothyroidism    IBS (irritable bowel syndrome)    Insomnia    Iron deficiency    Laryngopharyngeal reflux (LPR)    Obesity    Personal history of colonic polyps 04/2006   polypoid   PONV (postoperative nausea and vomiting)    RLS (restless legs syndrome)    Sleep apnea    no cpap    Past Medical History, Surgical history, Social history, and Family history were reviewed and updated as appropriate.   Please see review of systems for further details on the patient's review from today.   Objective:   Physical Exam:  There were no vitals taken for this visit.  Physical Exam Constitutional:      General: She is not in acute distress. Musculoskeletal:        General: No deformity.  Neurological:     Mental Status: She is alert and oriented to person, place, and time.     Coordination: Coordination normal.  Psychiatric:        Attention and  Perception: Attention and perception normal. She does not perceive auditory or visual hallucinations.        Mood and Affect: Mood normal. Mood is not anxious or depressed. Affect is not labile, blunt, angry or inappropriate.        Speech: Speech normal.        Behavior: Behavior normal.        Thought Content: Thought content normal. Thought content is not paranoid or delusional. Thought content does not include homicidal or suicidal ideation. Thought content does not include homicidal or suicidal plan.        Cognition and Memory: Cognition and memory normal.        Judgment: Judgment normal.     Comments: Insight intact     Lab Review:     Component Value Date/Time   NA 140 05/26/2019 1046   K 3.4 (L) 05/26/2019 1046   CL 95 (L) 05/26/2019 1046   CO2 37 (H) 05/26/2019 1046   GLUCOSE 97 05/26/2019 1046   BUN 17 05/26/2019 1046   CREATININE 1.09 05/26/2019 1046   CALCIUM 9.3 05/26/2019 1046   PROT 6.9 05/26/2019 1046   ALBUMIN 4.2 05/26/2019 1046   AST 36 05/26/2019 1046   ALT 30 05/26/2019 1046   ALKPHOS 98 05/26/2019 1046   BILITOT 0.4 05/26/2019 1046       Component Value Date/Time   WBC 6.9 05/26/2019 1046   RBC 4.42 05/26/2019 1046   HGB 12.6 05/26/2019 1046   HCT 38.7 05/26/2019 1046   PLT 209.0 05/26/2019 1046   MCV 87.6 05/26/2019 1046   MCHC 32.5 05/26/2019 1046   RDW 14.6 05/26/2019 1046   LYMPHSABS 2.0 05/26/2019 1046   MONOABS 0.6 05/26/2019 1046   EOSABS 0.1 05/26/2019 1046   BASOSABS 0.0 05/26/2019 1046    No results found for: "POCLITH", "LITHIUM"   No results found for: "PHENYTOIN", "PHENOBARB", "VALPROATE", "CBMZ"   .res Assessment: Plan:    Plan:  1. Wellbutrin XL '450mg'$  daily - in the am  2. Cymbalta '60mg'$  BID 3. Trazadone '100mg'$  - 2 at hs  4. Vraylar '3mg'$  daily though patient assistance.  5. Clonazepam 0.'5mg'$  BID  Continue therapy with Bambi Cottle.   RTC 4/6 weeks  Patient advised to contact office with any questions, adverse effects,  or acute worsening in signs and symptoms.  Discussed potential metabolic side effects associated with atypical antipsychotics, as well as potential risk for movement side effects. Advised pt to contact office if movement side effects occur.  Diagnoses and all orders for this visit:  Generalized anxiety disorder  Major depressive disorder, recurrent episode, moderate (HCC)  PTSD (post-traumatic stress disorder)  Insomnia, unspecified type     Please see After Visit Summary for patient specific instructions.  Future Appointments  Date Time Provider [place]  07/31/2022  2:00 PM Cottle, Lucious [place], [place] [facility] None  08/14/2022 10:00 AM Gertie Gowda, DO [facility] CPR  08/21/2022  2:00 PM Cottle, Bambi G, LCSW [facility] None  09/10/2022  9:50 AM Pieter Partridge, DO [facility] None  09/11/2022  2:00 PM Cottle, Bambi G, LCSW [facility] None  10/02/2022  2:00 PM Cottle, Bambi G, LCSW [facility] None  10/23/2022  2:00 PM Cottle, Bambi G, LCSW [facility] None  11/13/2022  2:00 PM Cottle, Bambi G, LCSW [facility] None    No orders of the defined types were placed in this encounter.   -------------------------------

## 2022-07-24 DIAGNOSIS — N950 Postmenopausal bleeding: Secondary | ICD-10-CM | POA: Diagnosis not present

## 2022-07-31 ENCOUNTER — Ambulatory Visit (INDEPENDENT_AMBULATORY_CARE_PROVIDER_SITE_OTHER): Payer: Medicare HMO | Admitting: Psychology

## 2022-07-31 DIAGNOSIS — F331 Major depressive disorder, recurrent, moderate: Secondary | ICD-10-CM

## 2022-07-31 DIAGNOSIS — F411 Generalized anxiety disorder: Secondary | ICD-10-CM | POA: Diagnosis not present

## 2022-07-31 DIAGNOSIS — F4310 Post-traumatic stress disorder, unspecified: Secondary | ICD-10-CM | POA: Diagnosis not present

## 2022-07-31 NOTE — Progress Notes (Signed)
[facility] Counselor/Therapist Progress Note  Patient ID: Paige [name], MRN: [id number],    Date: 07/31/2022  Time Spent: 60 minutes  Treatment Type: Individual Therapy  Reported Symptoms: depression and worry  Mental Status Exam: Appearance:  Casual     Behavior: Appropriate  Motor: Normal  Speech/Language:  Normal Rate  Affect: blunted  Mood: pleasant  Thought process: normal  Thought content:   WNL  Sensory/Perceptual disturbances:   WNL  Orientation: oriented to person, place, time/date, and situation  Attention: Good  Concentration: Good  Memory: WNL  Fund of knowledge:  Good  Insight:   Good  Judgment:  Fair  Impulse Control: Fair   Risk Assessment: Danger to Self:  No Self-injurious Behavior: No Danger to Others: No Duty to Warn:no Physical Aggression / Violence:No  Access to Firearms a concern: No  Gang Involvement:No   Subjective: The patient attended a face-to-face individual therapy session via video visit.  The patient gave verbal consent for this session to be on video on WebEx.  The patient was in her home alone and therapist was in the office alone.  The patient presents with a blunted affect and mood is somewhat depressed.  The patient states that her ex-boyfriend is still trying to get her to get back together with him.  We processed her feelings about the relationship and talked about how she is really never been totally enamored with him.  She has complained in the past about him talking about politics a lot and we talked about that being a big concern.  The patient states that she is recovering from [place] and that she got it from her mother.  We talked about how people have attempted to help her and take care of her.  In addition we talked about how as she has been in the relationship she has been in that she is seem to have lost some of her own personality.  I encouraged her to give it some thought and to do what she wants to do  however there is some concern that if she gets back together with her boyfriend that it will not work in the long run.  We also talked about other relationships that she has been involved in him looked at those from the perspective of whether they were healthy relationships as well.  Interventions: Cognitive Behavioral Therapy  Diagnosis:Generalized anxiety disorder  Major depressive disorder, recurrent episode, moderate (HCC)  PTSD (post-traumatic stress disorder)  Plan: Treatment Plan  Strengths/Abilities:  Intelligent, kind, motivated  Treatment Preferences:  Outpatient Individual therapy  Statement of Needs:  "I need some help with getting back out into the world and learn how to interact and be social"   Symptoms:  Describes a reliving of the event, particularly through dissociative flashbacks.:  (Status: improved). Displays a significant decline in interest and engagement in activities.: (Status: improved). Displays significant psychological and/or physiological  distress resulting from internal and external clues that are reminiscent of the traumatic event.:  (Status: improved). Experiences disturbances in sleep.:   (Status: improved). Experiences disturbing and persistent thoughts, images, and/or perceptions of the  traumatic event.: (Status: improved). Experiences frequent nightmares.:  (Status: improved). Has been exposed to a traumatic event involving actual or  perceived threat of death or serious injury.: (Status: maintained).  Hypervigilance (e.g., feeling constantly on edge, experiencing concentration difficulties, having trouble falling or staying asleep, exhibiting a general state of irritability).: (Status:  improved). Impairment in social, occupational, or other areas of functioning.:  (  Status: improved). Intentionally avoids activities, places, people, or objects (e.g., up-armored vehicles) that evoke memories of the event.:  (Status: improved). Intentionally avoids   thoughts, feelings, or discussions related to the traumatic event.: (Status:  improved). Reports difficulty concentrating as well as feelings of guilt.:   (Status: improved). Reports response of intense fear, helplessness, or horror to the traumatic event.: (Status: improved). Symptoms present more than one month.:  (Status: maintained).    Problems Addressed:  Anxiety, Posttraumatic Stress Disorder (PTSD),  Goals:  LTG:  1. Enhance ability to effectively cope with the full variety of life's worries  and anxieties.  70% 2. No longer avoids persons, places, activities, and objects that are  reminiscent of the traumatic event.  70% 3. No longer experiences intrusive event recollections, avoidance of event  reminders, intense arousal, or disinterest in activities or  relationships.  70% 4. Stabilize anxiety level while increasing ability to function on a daily  basis.  80% 5. Thinks about or openly discusses the traumatic event with others  without experiencing psychological or physiological distress.  80%  STG: 1.Identify and engage in pleasant activities on a daily basis..  80% 2.Identify, challenge, and replace biased, fearful self-talk with positive, realistic, and empowering self talk 3.  Participate in Cognitive Therapy to help identify, challenge, and replace biased, negative, and selfdefeating thoughts resulting from the trauma.  80 % 4.  Participate in Eye Movement Desensitization and Reprocessing (EMDR) to reduce emotional distress  related to traumatic thoughts, feelings, and images.  90%  Target Date:  02/17/2023 Frequency: Biweekly Modality:Individual Therapy Interventions by Therapist:  CBT, EMDR, insight oriented therapy, problem solving  Patient approved treatment plan and is progressing nicely.  Akera Snowberger G Janoah Menna, LCSW                        Linkoln Alkire G Ashliegh Parekh, LCSW               Jaquavious Mercer G Nandana Krolikowski, LCSW               Garrie Woodin G  Franke Menter, LCSW               Praise Stennett G Elad Macphail, LCSW               Gary Gabrielsen G Carri Spillers, LCSW               Jaida Basurto G Adaiah Morken, LCSW               Andreana Klingerman G Suzzette Gasparro, LCSW               Jastin Fore G Karandeep Resende, LCSW               Cannon Quinton G Aasiyah Auerbach, LCSW               Alexiz Sustaita G Arthuro Canelo, LCSW               Geanie Pacifico G Subhan Hoopes, LCSW

## 2022-08-12 DIAGNOSIS — M5106 Intervertebral disc disorders with myelopathy, lumbar region: Secondary | ICD-10-CM | POA: Diagnosis not present

## 2022-08-12 DIAGNOSIS — M4326 Fusion of spine, lumbar region: Secondary | ICD-10-CM | POA: Diagnosis not present

## 2022-08-12 DIAGNOSIS — M47816 Spondylosis without myelopathy or radiculopathy, lumbar region: Secondary | ICD-10-CM | POA: Diagnosis not present

## 2022-08-14 ENCOUNTER — Encounter: Payer: Medicare HMO | Admitting: Physical Medicine and Rehabilitation

## 2022-08-16 DIAGNOSIS — D2262 Melanocytic nevi of left upper limb, including shoulder: Secondary | ICD-10-CM | POA: Diagnosis not present

## 2022-08-16 DIAGNOSIS — D2272 Melanocytic nevi of left lower limb, including hip: Secondary | ICD-10-CM | POA: Diagnosis not present

## 2022-08-16 DIAGNOSIS — D2261 Melanocytic nevi of right upper limb, including shoulder: Secondary | ICD-10-CM | POA: Diagnosis not present

## 2022-08-16 DIAGNOSIS — D485 Neoplasm of uncertain behavior of skin: Secondary | ICD-10-CM | POA: Diagnosis not present

## 2022-08-16 DIAGNOSIS — D225 Melanocytic nevi of trunk: Secondary | ICD-10-CM | POA: Diagnosis not present

## 2022-08-16 DIAGNOSIS — D2361 Other benign neoplasm of skin of right upper limb, including shoulder: Secondary | ICD-10-CM | POA: Diagnosis not present

## 2022-08-16 DIAGNOSIS — Z85828 Personal history of other malignant neoplasm of skin: Secondary | ICD-10-CM | POA: Diagnosis not present

## 2022-08-16 DIAGNOSIS — L814 Other melanin hyperpigmentation: Secondary | ICD-10-CM | POA: Diagnosis not present

## 2022-08-16 DIAGNOSIS — L821 Other seborrheic keratosis: Secondary | ICD-10-CM | POA: Diagnosis not present

## 2022-08-16 DIAGNOSIS — D2271 Melanocytic nevi of right lower limb, including hip: Secondary | ICD-10-CM | POA: Diagnosis not present

## 2022-08-16 DIAGNOSIS — D1801 Hemangioma of skin and subcutaneous tissue: Secondary | ICD-10-CM | POA: Diagnosis not present

## 2022-08-21 ENCOUNTER — Ambulatory Visit (INDEPENDENT_AMBULATORY_CARE_PROVIDER_SITE_OTHER): Payer: Medicare HMO | Admitting: Psychology

## 2022-08-21 DIAGNOSIS — F4310 Post-traumatic stress disorder, unspecified: Secondary | ICD-10-CM | POA: Diagnosis not present

## 2022-08-22 NOTE — Progress Notes (Signed)
[facility] Counselor/Therapist Progress Note  Patient ID: Paige [name], MRN: [id number],    Date: 08/21/2022  Time Spent: 60 minutes  Treatment Type: Individual Therapy  Reported Symptoms: depression and worry  Mental Status Exam: Appearance:  Casual     Behavior: Appropriate  Motor: Normal  Speech/Language:  Normal Rate  Affect: blunted  Mood: pleasant  Thought process: normal  Thought content:   WNL  Sensory/Perceptual disturbances:   WNL  Orientation: oriented to person, place, time/date, and situation  Attention: Good  Concentration: Good  Memory: WNL  Fund of knowledge:  Good  Insight:   Good  Judgment:  Fair  Impulse Control: Fair   Risk Assessment: Danger to Self:  No Self-injurious Behavior: No Danger to Others: No Duty to Warn:no Physical Aggression / Violence:No  Access to Firearms a concern: No  Gang Involvement:No   Subjective: The patient attended a face-to-face individual therapy session via video visit.  The patient gave verbal consent for this session to be on video on WebEx.  The patient was in her home alone and therapist was in the office alone.  The patient presents with a blunted affect and mood was pleasant..  The patient reports that she is broken up with her boyfriend again.  She states that he told her that he wanted her to put him first.  We processed how she feels about this relationship.  She feels that he is very controlling at times.  We discussed how she can now focus on herself and taking care of her own needs.  We discussed her getting back on and eating program that is healthier for her.  The patient is still doing well and not having PTSD symptoms.  She reports that she has been able to get more done around the house.    Interventions: Cognitive Behavioral Therapy  Diagnosis:PTSD (post-traumatic stress disorder)  Plan: Treatment Plan  Strengths/Abilities:  Intelligent, kind, motivated  Treatment Preferences:   Outpatient Individual therapy  Statement of Needs:  "I need some help with getting back out into the world and learn how to interact and be social"   Symptoms:  Describes a reliving of the event, particularly through dissociative flashbacks.:  (Status: improved). Displays a significant decline in interest and engagement in activities.: (Status: improved). Displays significant psychological and/or physiological  distress resulting from internal and external clues that are reminiscent of the traumatic event.:  (Status: improved). Experiences disturbances in sleep.:   (Status: improved). Experiences disturbing and persistent thoughts, images, and/or perceptions of the  traumatic event.: (Status: improved). Experiences frequent nightmares.:  (Status: improved). Has been exposed to a traumatic event involving actual or  perceived threat of death or serious injury.: (Status: maintained).  Hypervigilance (e.g., feeling constantly on edge, experiencing concentration difficulties, having trouble falling or staying asleep, exhibiting a general state of irritability).: (Status:  improved). Impairment in social, occupational, or other areas of functioning.:  (Status: improved). Intentionally avoids activities, places, people, or objects (e.g., up-armored vehicles) that evoke memories of the event.:  (Status: improved). Intentionally avoids  thoughts, feelings, or discussions related to the traumatic event.: (Status:  improved). Reports difficulty concentrating as well as feelings of guilt.:   (Status: improved). Reports response of intense fear, helplessness, or horror to the traumatic event.: (Status: improved). Symptoms present more than one month.:  (Status: maintained).    Problems Addressed:  Anxiety, Posttraumatic Stress Disorder (PTSD),  Goals:  LTG:  1. Enhance ability to effectively cope with the full variety  of life's worries  and anxieties.  70% 2. No longer avoids persons, places,  activities, and objects that are  reminiscent of the traumatic event.  70% 3. No longer experiences intrusive event recollections, avoidance of event  reminders, intense arousal, or disinterest in activities or  relationships.  70% 4. Stabilize anxiety level while increasing ability to function on a daily  basis.  80% 5. Thinks about or openly discusses the traumatic event with others  without experiencing psychological or physiological distress.  80%  STG: 1.Identify and engage in pleasant activities on a daily basis..  80% 2.Identify, challenge, and replace biased, fearful self-talk with positive, realistic, and empowering self talk 3.  Participate in Cognitive Therapy to help identify, challenge, and replace biased, negative, and selfdefeating thoughts resulting from the trauma.  80 % 4.  Participate in Eye Movement Desensitization and Reprocessing (EMDR) to reduce emotional distress  related to traumatic thoughts, feelings, and images.  90%  Target Date:  02/17/2023 Frequency: Biweekly Modality:Individual Therapy Interventions by Therapist:  CBT, EMDR, insight oriented therapy, problem solving  Patient approved treatment plan and is progressing nicely.  Monti Villers G Jeyren Danowski, LCSW

## 2022-09-02 DIAGNOSIS — Z6836 Body mass index (BMI) 36.0-36.9, adult: Secondary | ICD-10-CM | POA: Diagnosis not present

## 2022-09-02 DIAGNOSIS — M479 Spondylosis, unspecified: Secondary | ICD-10-CM | POA: Diagnosis not present

## 2022-09-02 DIAGNOSIS — E039 Hypothyroidism, unspecified: Secondary | ICD-10-CM | POA: Diagnosis not present

## 2022-09-02 DIAGNOSIS — M25552 Pain in left hip: Secondary | ICD-10-CM | POA: Diagnosis not present

## 2022-09-02 DIAGNOSIS — H6990 Unspecified Eustachian tube disorder, unspecified ear: Secondary | ICD-10-CM | POA: Diagnosis not present

## 2022-09-04 DIAGNOSIS — J310 Chronic rhinitis: Secondary | ICD-10-CM | POA: Diagnosis not present

## 2022-09-04 DIAGNOSIS — Z789 Other specified health status: Secondary | ICD-10-CM | POA: Diagnosis not present

## 2022-09-04 DIAGNOSIS — G4733 Obstructive sleep apnea (adult) (pediatric): Secondary | ICD-10-CM | POA: Diagnosis not present

## 2022-09-06 NOTE — Progress Notes (Unsigned)
NEUROLOGY FOLLOW UP OFFICE NOTE  Paige [name] [id number]  Assessment/Plan:   Migraine without aura, without status migrainosus, not intractable Chronic pain syndrome History of cauda equina secondary to lumbar spinal stenosis at L4-5 s/p laminectomy and fusion   Migraine prevention:  Aimovig '70mg'$  every 28 days.   Migraine rescue:  Roselyn Meier '100mg'$  Limit use of pain relievers to no more than 2 days out of week to prevent risk of rebound or medication-overuse headache. Keep headache diary Follow up one year     Subjective:  Paige [name] is a 56 year old right-handed female with hypothyroidism, restless leg syndrome and depression/PTSD who follows up for migraines and neuropathy.   UPDATE: Migraine: Intensity:  moderate Duration:  30-60 minutes Frequency:  1 to 2 days a month (last week prior to injection) Frequency of abortive medication: 1 to 2 times a month   Current NSAIDS: None Current analgesics: None Current triptans: None Current ergotamine: None Current anti-emetic: Zofran ODT 4 mg Current muscle relaxants: Baclofen 10 mg Current anti-anxiolytic: Lajuan Lines 1 mg Current sleep aide: Trazodone 100 mg Current Antihypertensive medications: None Current Antidepressant medications: Cymbalta 60 mg, Wellbutrin XR 150 mg Current Anticonvulsant medications: Gabapentin '800mg'$  twice daily Current anti-CGRP: Aimovig '70mg'$ , Ubrelvy '100mg'$  Current Vitamins/Herbal/Supplements: B complex, C, co-Q10 Current Antihistamines/Decongestants: None Other therapy:  none Other medication:  Requip  Walking 4 miles a day     HISTORY: Migraine: Onset:  Since teenager.  She has migraines as well as daily headache which have gotten worse over the past 2 years. Location:  Migraines: bi-frontal/retro-orbital/temples; Daily headache:  Bi-frontal/temporal Quality:  Migraine: pounding; Daily headache: non-throbbing Initial intensity:  Migraine: 10/10; Daily headache: 6/10 Aura:   no Prodrome:  no Associated symptoms:  Migraine: nausea, photophobia, phonophobia, osmophobia, sometimes vomiting; Daily headache: sometimes photophobia Initial Duration:  Migraine:  Several hours; Daily headache: constant Initial Frequency:  Migraine: every other week; Daily headache:  constant Triggers:  Certain scents, bright light, pork Relieving factors:  Rest Activity:  Cannot function with migraine   Past NSAIDS:  Ibuprofen, naproxen Past analgesics:  Percocet (hives), morphine (itching) Past abortive triptans:  sumatriptan (difficulty breathing), rizatriptan, eletriptan Past muscle relaxants:  cyclobenzaprine (hallucinations) Past anti-emetic:  phenergan Past antihypertensive medications:  no Past antidepressant medications:  Amitriptyline, possibly venlafaxine Past anticonvulsant medications:  Depakote, topiramate '100mg'$  twice daily (side effect), zonisamide '50mg'$ , lamotrigine Past anti-CGRP:  Ubrelvy '100mg'$  (effective but no longer covered by her insurance) Past vitamins/Herbal/Supplements:  no Other past therapy:  Botox, acupuncture   Family history of headache:  Paternal aunt   Chronic Pain/Lumbar spinal stenosis/Cauda equina Syndrome: Since early 2020, she reports numbness and tingling in the toes.  TSH from 05/26/19 was 1.72.  NCV-EMG from 07/15/19 was normal.  Labs from June 2020 demonstrated TSH 1.72.  Labs from October 2020 demonstrated ANA negative; sed rate 19; SPEP/IFE negative for M-spike; B12 401; B6 6.2.  Punch Skin Biopsy performed in November 2020 was negative for small fiber neuropathy but demonstrated some degenerative changes within epidermal nerves which are non-specific and may be normal but also may be predictive of future development of clinical neuropathy.  She had inserted a spina stimulator in the right hip in December 2020.  She subsequently developed cauda equina syndrome with severe pain radiating down both legs with new bilateral lower extremity tingling,  saddle anesthesia and bowel and bladder dysfunction.  CT of lumbar spine showed severe spinal stenosis at L4-5 with bulging disc and progressed anteriolithesis, moderate to severe subarticular  stenosis bilaterally, and levoscoliosis at T12-L1.  MRI unable to be performed due to spinal stimulator, so CT myelogram was performed, showing severe spinal stenosis at L4-5 with effacement of root bilaterally and severe compression of the canal.  She underwent L4-S1 laminectomy TLIF and PSF in July 2021.  She has improved bowel and bladder function.    History of Subjective Memory Deficits: She has noted short term memory problems since around 2017.  She frequently misplaces items around the house.  She does get distracted and will not get around to chores around the house.  She has been late paying bills on three occasions.  One time, she got disoriented driving on a familiar route.  She sometimes has trouble recalling names of familiar people.  She also has word-finding difficulty and problems with spelling.  She saw a speech pathologist who told her she has memory deficits.  Her thyroid panel was within normal range.   B12 level from 04/07/2018 was 560.  She underwent neuropsychological testing on 09/16/2018 which demonstrated deficits in verbal fluency, working memory, and mental flexibility as well as mildly below expectation performances in processing speed, learning and memory for information that is not repeated, and verbal abstract reasoning.  However, visual-spatial skills, basic attention, nonverbal abstract reasoning, confrontation naming, and memory for information that is repeated were well within normal limits.  There was no evidence of cortical dysfunction or early onset Alzheimer's disease.  Her cognitive difficulties were suspected to be related to her severe depression, high level of pain, and insomnia. Her uncle and aunt on her mother's side had dementia.  PAST MEDICAL HISTORY: Past Medical History:   Diagnosis Date   Allergic rhinitis    Allergy    Anemia    Anxiety disorder    Cancer (HCC)    skin   Chronic fatigue    Chronic headaches    Chronic pain    Depression    Endometriosis    Family history of colon cancer    mother,uncles,aunts   Fibromyalgia    Gallstones    GERD (gastroesophageal reflux disease)    Hypothyroidism    IBS (irritable bowel syndrome)    Insomnia    Iron deficiency    Laryngopharyngeal reflux (LPR)    Obesity    Personal history of colonic polyps 04/2006   polypoid   PONV (postoperative nausea and vomiting)    RLS (restless legs syndrome)    Sleep apnea    no cpap    MEDICATIONS: Current Outpatient Medications on File Prior to Visit  Medication Sig Dispense Refill   buPROPion (WELLBUTRIN XL) 150 MG 24 hr tablet TAKE 3 TABLETS EVERY MORNING 270 tablet 3   cariprazine (VRAYLAR) 3 MG capsule Take 1 capsule (3 mg total) by mouth daily. 90 capsule 3   cetirizine (ZYRTEC) 10 MG tablet Take by mouth. (Patient not taking: Reported on 10/04/2021)     clonazePAM (KLONOPIN) 0.5 MG tablet Take 1 tablet (0.5 mg total) by mouth 2 (two) times daily as needed for anxiety. 30 tablet 2   dicyclomine (BENTYL) 10 MG capsule Take 1 capsule (10 mg total) by mouth 3 (three) times daily as needed for spasms. (Patient not taking: Reported on 10/04/2021) 90 capsule 11   DOTTI 0.0375 MG/24HR Place onto the skin.     DULoxetine (CYMBALTA) 60 MG capsule Take 1 capsule (60 mg total) by mouth 2 (two) times daily. 180 capsule 3   Erenumab-aooe (AIMOVIG) 70 MG/ML SOAJ INJECT 70 MG INTO THE  SKIN EVERY 28 DAYS. 1 mL 11   Fe Fum-Fe Poly-Vit C-Lactobac (FUSION) 65-65-25-30 MG CAPS Take by mouth. (Patient not taking: Reported on 10/04/2021)     ipratropium (ATROVENT) 0.06 % nasal spray INSTILL 2 SPRAYS INTO EACH NOSTRIL AS NEEDED TWICE A DAY (Patient not taking: Reported on 10/04/2021)     levothyroxine (SYNTHROID, LEVOTHROID) 88 MCG tablet Take 88 mcg by mouth daily before  breakfast.     liothyronine (CYTOMEL) 5 MCG tablet Take 5 mcg by mouth daily. Monday Wednesday Friday     nystatin cream (MYCOSTATIN) Apply 1 application topically daily as needed for dry skin. (Patient not taking: Reported on 10/04/2021)     olopatadine (PATANOL) 0.1 % ophthalmic solution Apply to eye. (Patient not taking: Reported on 10/04/2021)     omeprazole (PRILOSEC) 20 MG capsule      ondansetron (ZOFRAN-ODT) 4 MG disintegrating tablet Take 1 tablet (4 mg total) by mouth every 8 (eight) hours as needed for nausea or vomiting. 20 tablet 6   Oxycodone HCl 10 MG TABS Take 10 mg by mouth every 6 (six) hours as needed.     Polyethyl Glycol-Propyl Glycol 0.4-0.3 % SOLN Apply 1 drop to eye as needed. (Patient not taking: No sig reported)     polyethylene glycol (MIRALAX / GLYCOLAX) 17 g packet Take 17 g by mouth 2 (two) times daily. (Patient not taking: Reported on 10/04/2021)     potassium chloride (K-DUR,KLOR-CON) 10 MEQ tablet Take 1 tablet by mouth daily.     progesterone (PROMETRIUM) 200 MG capsule Take 200 mg by mouth at bedtime.     promethazine (PHENERGAN) 12.5 MG tablet Take 1 tablet (12.5 mg total) by mouth every 8 (eight) hours as needed for nausea or vomiting. (Patient not taking: Reported on 10/04/2021) 30 tablet 0   rOPINIRole (REQUIP) 2 MG tablet Take 2 mg by mouth 2 (two) times daily.     terconazole (TERAZOL 3) 0.8 % vaginal cream Place 1 applicator vaginally at bedtime. (Patient not taking: Reported on 10/04/2021)     tiZANidine (ZANAFLEX) 2 MG tablet Take 2 mg by mouth 3 (three) times daily. (Patient not taking: Reported on 10/04/2021)     torsemide (DEMADEX) 20 MG tablet Take 20 mg by mouth daily.      traZODone (DESYREL) 100 MG tablet Take 2 tablets (200 mg total) by mouth at bedtime. 180 tablet 3   Ubrogepant (UBRELVY) 100 MG TABS Take 1 tablet by mouth daily as needed (at onset of headaches may repeat dose after 2 hours as needed max 200 mg in 24 hours). (Patient not taking:  Reported on 10/04/2021) 16 tablet 11   Current Facility-Administered Medications on File Prior to Visit  Medication Dose Route Frequency Provider Last Rate Last Admin   0.9 %  sodium chloride infusion  500 mL Intravenous Once Ladene Artist, MD        ALLERGIES: Allergies  Allergen Reactions   Cyclobenzaprine Other (See Comments)    Other reaction(s): Unknown Hallucinations Unknown    Meloxicam Other (See Comments)   Naltrexone Other (See Comments)    SEVERE PAIN ALL OVER   Pork (Porcine) Protein Other (See Comments)    Other reaction(s): Other (See Comments) Migraines Other reaction(s): Other (See Comments) Migraines   Sumatriptan Other (See Comments)    Difficulty breathing Other reaction(s): Other (see comments) Off balance    Tape Dermatitis   Flexeril [Cyclobenzaprine Hcl]    Imitrex [Sumatriptan Base]    Other  Sulfa Antibiotics     Pt states she does not think she is allergic to Sulfa     FAMILY HISTORY: Family History  Problem Relation Age of Onset   Colon cancer Mother        uncles,aunts   Diabetes Father        paternal grandmother,sister   Leukemia Father    Lymphoma Father    Skin cancer Father    Heart disease Father    Colon polyps Sister    Diabetes Sister    Colon cancer Maternal Aunt    Colon cancer Maternal Uncle    Colon polyps Cousin    Stomach cancer Neg Hx    Esophageal cancer Neg Hx    Rectal cancer Neg Hx       Objective:  Blood pressure 121/82, pulse 63, height '5\' 9"'$  (1.753 m), weight 220 lb (99.8 kg), SpO2 98 %. General: No acute distress.  Patient appears well-groomed.   Head:  Normocephalic/atraumatic Eyes:  Fundi examined but not visualized Neck: supple, no paraspinal tenderness, full range of motion Heart:  Regular rate and rhythm Neurological Exam: alert and oriented to person, place, and time.  Speech fluent and not dysarthric, language intact.  CN II-XII intact. Bulk and tone normal, muscle strength 5/5 throughout.   Sensation to light touch intact.  Deep tendon reflexes 2+ throughout, toes downgoing.  Finger to nose testing intact.  Gait normal, Romberg negative.   Metta Clines, DO  CC: Ernestene Kiel, MD

## 2022-09-10 ENCOUNTER — Ambulatory Visit: Payer: Medicare HMO | Admitting: Neurology

## 2022-09-10 VITALS — BP 121/82 | HR 63 | Ht 69.0 in | Wt 220.0 lb

## 2022-09-10 DIAGNOSIS — G43009 Migraine without aura, not intractable, without status migrainosus: Secondary | ICD-10-CM

## 2022-09-10 DIAGNOSIS — R5383 Other fatigue: Secondary | ICD-10-CM | POA: Diagnosis not present

## 2022-09-10 DIAGNOSIS — N951 Menopausal and female climacteric states: Secondary | ICD-10-CM | POA: Diagnosis not present

## 2022-09-10 DIAGNOSIS — G894 Chronic pain syndrome: Secondary | ICD-10-CM

## 2022-09-10 MED ORDER — UBRELVY 100 MG PO TABS: 1.0000 | ORAL_TABLET | Freq: Every day | ORAL | 11 refills | Status: DC | PRN

## 2022-09-10 MED ORDER — AIMOVIG 70 MG/ML ~~LOC~~ SOAJ
SUBCUTANEOUS | 11 refills | Status: DC
Start: 2022-09-10 — End: 2022-09-26

## 2022-09-11 ENCOUNTER — Ambulatory Visit (INDEPENDENT_AMBULATORY_CARE_PROVIDER_SITE_OTHER): Payer: Medicare HMO | Admitting: Psychology

## 2022-09-11 ENCOUNTER — Encounter: Payer: Self-pay | Admitting: Neurology

## 2022-09-11 DIAGNOSIS — F4310 Post-traumatic stress disorder, unspecified: Secondary | ICD-10-CM

## 2022-09-12 DIAGNOSIS — R5383 Other fatigue: Secondary | ICD-10-CM | POA: Diagnosis not present

## 2022-09-12 DIAGNOSIS — Z6835 Body mass index (BMI) 35.0-35.9, adult: Secondary | ICD-10-CM | POA: Diagnosis not present

## 2022-09-12 DIAGNOSIS — R6882 Decreased libido: Secondary | ICD-10-CM | POA: Diagnosis not present

## 2022-09-12 DIAGNOSIS — N951 Menopausal and female climacteric states: Secondary | ICD-10-CM | POA: Diagnosis not present

## 2022-09-12 DIAGNOSIS — N898 Other specified noninflammatory disorders of vagina: Secondary | ICD-10-CM | POA: Diagnosis not present
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
## 2022-09-17 DIAGNOSIS — E039 Hypothyroidism, unspecified: Secondary | ICD-10-CM | POA: Diagnosis not present

## 2022-09-23 DIAGNOSIS — M25522 Pain in left elbow: Secondary | ICD-10-CM | POA: Diagnosis not present

## 2022-09-23 DIAGNOSIS — M7712 Lateral epicondylitis, left elbow: Secondary | ICD-10-CM | POA: Diagnosis not present

## 2022-09-25 ENCOUNTER — Other Ambulatory Visit: Payer: Self-pay | Admitting: Neurology

## 2022-09-25 ENCOUNTER — Telehealth: Payer: Self-pay | Admitting: Neurology

## 2022-09-25 NOTE — Telephone Encounter (Signed)
Pt called in stating she had [place] send the Aimovig to the incorrect pharmacy. It needs to go to [facility] in [place]

## 2022-09-26 ENCOUNTER — Other Ambulatory Visit: Payer: Self-pay

## 2022-09-26 MED ORDER — AIMOVIG 70 MG/ML ~~LOC~~ SOAJ: SUBCUTANEOUS | 11 refills | Status: DC

## 2022-09-26 NOTE — Telephone Encounter (Signed)
Sent to [facility]

## 2022-10-02 ENCOUNTER — Ambulatory Visit (INDEPENDENT_AMBULATORY_CARE_PROVIDER_SITE_OTHER): Payer: Medicare HMO | Admitting: Psychology

## 2022-10-02 DIAGNOSIS — F4310 Post-traumatic stress disorder, unspecified: Secondary | ICD-10-CM
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
## 2022-10-23 ENCOUNTER — Encounter: Payer: Self-pay | Admitting: Adult Health

## 2022-10-23 ENCOUNTER — Ambulatory Visit (INDEPENDENT_AMBULATORY_CARE_PROVIDER_SITE_OTHER): Payer: Medicare HMO | Admitting: Psychology

## 2022-10-23 ENCOUNTER — Ambulatory Visit: Payer: Medicare HMO | Admitting: Adult Health

## 2022-10-23 DIAGNOSIS — G4700 Insomnia, unspecified: Secondary | ICD-10-CM | POA: Diagnosis not present

## 2022-10-23 DIAGNOSIS — F4310 Post-traumatic stress disorder, unspecified: Secondary | ICD-10-CM | POA: Diagnosis not present

## 2022-10-23 DIAGNOSIS — F331 Major depressive disorder, recurrent, moderate: Secondary | ICD-10-CM

## 2022-10-23 DIAGNOSIS — F411 Generalized anxiety disorder: Secondary | ICD-10-CM

## 2022-10-23 MED ORDER — TRAZODONE HCL 100 MG PO TABS: 200.0000 mg | ORAL_TABLET | Freq: Every day | ORAL | 3 refills | Status: DC

## 2022-10-23 MED ORDER — CLONAZEPAM 0.5 MG PO TABS: 0.5000 mg | ORAL_TABLET | Freq: Two times a day (BID) | ORAL | 2 refills | Status: DC | PRN

## 2022-10-23 NOTE — Progress Notes (Signed)
IRYS NIGH [id number] May 28, 1966 56 y.o.  Subjective:   Patient ID:  Paige [name] is a 56 y.o. (DOB 06/11/1966) female.  Chief Complaint: No chief complaint on file.   HPI Paige [name] presents to the office today for follow-up of GAD, MDD, PTSD and insomnia.  Describes mood today as "not too good". Pleasant. Increased tearfulness. Mood symptoms - reports depression and irritability - "a little of both". Reports increased anxiety - pacing over the past few days. Reports worry, rumination and over thinking. Thinking about her father, mother with declining health, relationship issues. Mood is lower. Stating "I'm doing fair". Feels like medications are helpful. Continues to see therapist Chiropractor)  - every 3 weeks. Attends church - singing in the choir. Making medications as prescribed.  Energy levels lower. Active, walking some days. Enjoys some usual interests and activities. Single. Lives alone with 2 cats - "Marcello Moores and Eschbach" - 2 outdoor cats - Enid Derry and Milinda Cave. Mother local and supportive.  Appetite adequate. Weight loss - 231 pounds. Sleeps better some nights than others. Averages 5 to 6 hours. Focus and concentration difficulties. Getting some things done around the house. Disabled since 2009. Denies SI or HI.  Denies AH or VH. Denies self harm. Denies substance use.  Previous medication trials: Lexapro, Paxil, Trazadone, Wellbutrin SR, Geodon, Seroquel, Risperdal, Depakote, Topamax, Lithium, Provigil, Ambien, Cytomel, Clonazepam and other benzodixepaines, Elavil, Effexor, Remeron, Prozac, Anafranil, Tofranil, Zoloft, Geodon, Buspar, Adderall, Ritalin, Abilify, Rexulti, Lamictal, Latuda     Mini-Mental    Flowsheet Row Office Visit from 04/07/2018 in [facility]  Total Score (max 30 points ) 29      PHQ2-9    [facility] from 05/22/2018 in [facility] from 05/13/2018 in [facility] Telephone from 04/30/2018 in [facility]  PHQ-2 Total Score '5 5 2  '$ PHQ-9 Total Score '20 18 13        '$ Review of Systems:  Review of Systems  Musculoskeletal:  Negative for gait problem.  Neurological:  Negative for tremors.  Psychiatric/Behavioral:         Please refer to HPI    Medications: I have reviewed the patient's current medications.  Current Outpatient Medications  Medication Sig Dispense Refill   buPROPion (WELLBUTRIN XL) 150 MG 24 hr tablet TAKE 3 TABLETS EVERY MORNING 270 tablet 3   cariprazine (VRAYLAR) 3 MG capsule Take 1 capsule (3 mg total) by mouth daily. 90 capsule 3   cetirizine (ZYRTEC) 10 MG tablet Take by mouth.     clonazePAM (KLONOPIN) 0.5 MG tablet Take 1 tablet (0.5 mg total) by mouth 2 (two) times daily as needed for anxiety. 30 tablet 2   dicyclomine (BENTYL) 10 MG capsule Take 1 capsule (10 mg total) by mouth 3 (three) times daily as needed for spasms. 90 capsule 11   DULoxetine (CYMBALTA) 60 MG capsule Take 1 capsule (60 mg total) by mouth 2 (two) times daily. 180 capsule 3   Erenumab-aooe (AIMOVIG) 70 MG/ML SOAJ INJECT 70 MG INTO THE SKIN EVERY 28 DAYS. 1 mL 11   Fe Fum-Fe Poly-Vit C-Lactobac (FUSION) 65-65-25-30 MG CAPS Take by mouth.     ipratropium (ATROVENT) 0.06 % nasal spray      levothyroxine (SYNTHROID, LEVOTHROID) 88 MCG tablet Take 88 mcg by mouth daily before breakfast.     liothyronine (CYTOMEL) 5 MCG tablet Take 5 mcg by mouth daily. Monday Wednesday Friday     nystatin  cream (MYCOSTATIN) Apply 1 application  topically daily as needed for dry skin.     omeprazole (PRILOSEC) 20 MG capsule      ondansetron (ZOFRAN-ODT) 4 MG disintegrating tablet Take 1 tablet (4 mg total) by mouth every 8 (eight) hours as needed for nausea or vomiting. 20 tablet 6   Oxycodone HCl 10 MG TABS Take 10 mg by mouth every 6 (six) hours as needed.     Polyethyl Glycol-Propyl Glycol 0.4-0.3 % SOLN Apply 1 drop to eye as  needed.     polyethylene glycol (MIRALAX / GLYCOLAX) 17 g packet Take 17 g by mouth 2 (two) times daily.     potassium chloride (K-DUR,KLOR-CON) 10 MEQ tablet Take 1 tablet by mouth daily.     progesterone (PROMETRIUM) 200 MG capsule Take 200 mg by mouth at bedtime.     promethazine (PHENERGAN) 12.5 MG tablet Take 1 tablet (12.5 mg total) by mouth every 8 (eight) hours as needed for nausea or vomiting. 30 tablet 0   rOPINIRole (REQUIP) 2 MG tablet Take 2 mg by mouth 2 (two) times daily.     terconazole (TERAZOL 3) 0.8 % vaginal cream Place 1 applicator vaginally at bedtime.     tiZANidine (ZANAFLEX) 2 MG tablet Take 2 mg by mouth 3 (three) times daily.     torsemide (DEMADEX) 20 MG tablet Take 20 mg by mouth daily.      traZODone (DESYREL) 100 MG tablet Take 2 tablets (200 mg total) by mouth at bedtime. 180 tablet 3   Ubrogepant (UBRELVY) 100 MG TABS Take 1 tablet by mouth daily as needed (at onset of headaches may repeat dose after 2 hours as needed max 200 mg in 24 hours). 16 tablet 11   Current Facility-Administered Medications  Medication Dose Route Frequency Provider Last Rate Last Admin   0.9 %  sodium chloride infusion  500 mL Intravenous Once Ladene Artist, MD        Medication Side Effects: None  Allergies:  Allergies  Allergen Reactions   Cyclobenzaprine Other (See Comments)    Other reaction(s): Unknown Hallucinations Unknown    Meloxicam Other (See Comments)   Naltrexone Other (See Comments)    SEVERE PAIN ALL OVER   Pork (Porcine) Protein Other (See Comments)    Other reaction(s): Other (See Comments) Migraines Other reaction(s): Other (See Comments) Migraines   Sumatriptan Other (See Comments)    Difficulty breathing Other reaction(s): Other (see comments) Off balance    Tape Dermatitis   Flexeril [Cyclobenzaprine Hcl]    Imitrex [Sumatriptan Base]    Other    Sulfa Antibiotics     Pt states she does not think she is allergic to Sulfa     Past Medical  History:  Diagnosis Date   Allergic rhinitis    Allergy    Anemia    Anxiety disorder    Cancer (HCC)    skin   Chronic fatigue    Chronic headaches    Chronic pain    Depression    Endometriosis    Family history of colon cancer    mother,uncles,aunts   Fibromyalgia    Gallstones    GERD (gastroesophageal reflux disease)    Hypothyroidism    IBS (irritable bowel syndrome)    Insomnia    Iron deficiency    Laryngopharyngeal reflux (LPR)    Obesity    Personal history of colonic polyps 04/2006   polypoid   PONV (postoperative nausea and vomiting)  RLS (restless legs syndrome)    Sleep apnea    no cpap    Past Medical History, Surgical history, Social history, and Family history were reviewed and updated as appropriate.   Please see review of systems for further details on the patient's review from today.   Objective:   Physical Exam:  There were no vitals taken for this visit.  Physical Exam Constitutional:      General: She is not in acute distress. Musculoskeletal:        General: No deformity.  Neurological:     Mental Status: She is alert and oriented to person, place, and time.     Cranial Nerves: No dysarthria.     Coordination: Coordination normal.  Psychiatric:        Attention and Perception: Attention and perception normal. She does not perceive auditory or visual hallucinations.        Mood and Affect: Mood normal. Mood is not anxious or depressed. Affect is not labile, blunt, angry or inappropriate.        Speech: Speech normal.        Behavior: Behavior normal. Behavior is cooperative.        Thought Content: Thought content normal. Thought content is not paranoid or delusional. Thought content does not include homicidal or suicidal ideation. Thought content does not include homicidal or suicidal plan.        Cognition and Memory: Cognition and memory normal.        Judgment: Judgment normal.     Comments: Insight intact     Lab Review:      Component Value Date/Time   NA 140 05/26/2019 1046   K 3.4 (L) 05/26/2019 1046   CL 95 (L) 05/26/2019 1046   CO2 37 (H) 05/26/2019 1046   GLUCOSE 97 05/26/2019 1046   BUN 17 05/26/2019 1046   CREATININE 1.09 05/26/2019 1046   CALCIUM 9.3 05/26/2019 1046   PROT 6.9 05/26/2019 1046   ALBUMIN 4.2 05/26/2019 1046   AST 36 05/26/2019 1046   ALT 30 05/26/2019 1046   ALKPHOS 98 05/26/2019 1046   BILITOT 0.4 05/26/2019 1046       Component Value Date/Time   WBC 6.9 05/26/2019 1046   RBC 4.42 05/26/2019 1046   HGB 12.6 05/26/2019 1046   HCT 38.7 05/26/2019 1046   PLT 209.0 05/26/2019 1046   MCV 87.6 05/26/2019 1046   MCHC 32.5 05/26/2019 1046   RDW 14.6 05/26/2019 1046   LYMPHSABS 2.0 05/26/2019 1046   MONOABS 0.6 05/26/2019 1046   EOSABS 0.1 05/26/2019 1046   BASOSABS 0.0 05/26/2019 1046    No results found for: "POCLITH", "LITHIUM"   No results found for: "PHENYTOIN", "PHENOBARB", "VALPROATE", "CBMZ"   .res Assessment: Plan:    Plan:  1. Wellbutrin XL '450mg'$  daily - in the am  2. Cymbalta '60mg'$  BID 3. Trazadone '100mg'$  - 2 at hs  4. Vraylar '3mg'$  daily though patient assistance. 5. Clonazepam 0.'5mg'$  BID  Continue therapy with Bambi Cottle.   RTC 4/6 weeks  Patient advised to contact office with any questions, adverse effects, or acute worsening in signs and symptoms.  Discussed potential metabolic side effects associated with atypical antipsychotics, as well as potential risk for movement side effects. Advised pt to contact office if movement side effects occur.   Diagnoses and all orders for this visit:  PTSD (post-traumatic stress disorder)  Major depressive disorder, recurrent episode, moderate (HCC) -     traZODone (DESYREL) 100 MG tablet;  Take 2 tablets (200 mg total) by mouth at bedtime.  Insomnia, unspecified type -     traZODone (DESYREL) 100 MG tablet; Take 2 tablets (200 mg total) by mouth at bedtime.  Generalized anxiety disorder -     clonazePAM  (KLONOPIN) 0.5 MG tablet; Take 1 tablet (0.5 mg total) by mouth 2 (two) times daily as needed for anxiety.     Please see After Visit Summary for patient specific instructions.  Future Appointments  Date Time Provider [place]  10/23/2022  2:00 PM Cottle, Lucious Groves, LCSW [facility] None  11/13/2022  2:00 PM Cottle, Bambi G, LCSW [facility] None  09/10/2023  9:30 AM [name], Adam R, DO [facility] None    No orders of the defined types were placed in this encounter.   -------------------------------

## 2022-10-23 NOTE — Progress Notes (Signed)
Paige [name]  Patient ID: Paige [name], MRN: [id number],    Date: 10/23/2022  Time Spent: 60 minutes  Treatment Type: Individual Therapy  Reported Symptoms: depression and worry  Mental Status Exam: Appearance:  Casual     Behavior: Appropriate  Motor: Normal  Speech/Language:  Normal Rate  Affect: blunted  Mood: depressed  Thought process: normal  Thought content:   WNL  Sensory/Perceptual disturbances:   WNL  Orientation: oriented to person, place, time/date, and situation  Attention: Good  Concentration: Good  Memory: WNL  Fund of knowledge:  Good  Insight:   Good  Judgment:  Fair  Impulse Control: Fair   Risk Assessment: Danger to Self:  No Self-injurious Behavior: No Danger to Others: No Duty to Warn:no Physical Aggression / Violence:No  Access to Firearms a concern: No  Gang Involvement:No   Subjective: The patient attended a face-to-face individual therapy session via video visit.  The patient gave verbal consent for this session to be on video on WebEx.  The patient was in her home alone and therapist was in the office alone.  The patient presents with a blunted affect and mood was depressed.  The patient states that she has been crying a lot.  She says that she has been doing this because of the break-up with her boyfriend.  We processed this again and we talked about the possibility that he was not the right person for her and that some of what is going on with her is that she just misses having a companion that she wants.  She also talked about being in a relationship with a person who is trans and the person who is trans would like to be more intimate with her but she does not want to have this person as her boyfriend.  We discussed how she is possibly giving him mixed messages because she is saying that she does not want him to be her boyfriend but she allows him to come over every day and do things for her  around the house and I explained to her that that is giving him a mixed message and other people a mixed message.  She reports that she does not want people to think that they are dating however he is coming over every day and doing things for her.  The patient understood the concepts discussed and I recommended that she start focusing more on what she has going right as opposed to what is going wrong.  I also explained to her that she is going to need to set some boundaries with this person who is coming over if she decides that she some point she wants to have another relationship.  I explained to her that she is not going to find another relationship as long as he is continuing to come over.  Interventions: Cognitive Behavioral Therapy  Diagnosis:PTSD (post-traumatic stress disorder)  Plan: Treatment Plan  Strengths/Abilities:  Intelligent, kind, motivated  Treatment Preferences:  Outpatient Individual therapy  Statement of Needs:  "I need some help with getting back out into the world and learn how to interact and be social"   Symptoms:  Describes a reliving of the event, particularly through dissociative flashbacks.:  (Status: improved). Displays a significant decline in interest and engagement in activities.: (Status: improved). Displays significant psychological and/or physiological  distress resulting from internal and external clues that are reminiscent of the traumatic event.:  (Status: improved). Experiences disturbances in sleep.:   (  Status: improved). Experiences disturbing and persistent thoughts, images, and/or perceptions of the  traumatic event.: (Status: improved). Experiences frequent nightmares.:  (Status: improved). Has been exposed to a traumatic event involving actual or  perceived threat of death or serious injury.: (Status: maintained).  Hypervigilance (e.g., feeling constantly on edge, experiencing concentration difficulties, having trouble falling or staying asleep,  exhibiting a general state of irritability).: (Status:  improved). Impairment in social, occupational, or other areas of functioning.:  (Status: improved). Intentionally avoids activities, places, people, or objects (e.g., up-armored vehicles) that evoke memories of the event.:  (Status: improved). Intentionally avoids  thoughts, feelings, or discussions related to the traumatic event.: (Status:  improved). Reports difficulty concentrating as well as feelings of guilt.:   (Status: improved). Reports response of intense fear, helplessness, or horror to the traumatic event.: (Status: improved). Symptoms present more than one month.:  (Status: maintained).    Problems Addressed:  Anxiety, Posttraumatic Stress Disorder (PTSD),  Goals:  LTG:  1. Enhance ability to effectively cope with the full variety of life's worries  and anxieties.  70% 2. No longer avoids persons, places, activities, and objects that are  reminiscent of the traumatic event.  70% 3. No longer experiences intrusive event recollections, avoidance of event  reminders, intense arousal, or disinterest in activities or  relationships.  70% 4. Stabilize anxiety level while increasing ability to function on a daily  basis.  80% 5. Thinks about or openly discusses the traumatic event with others  without experiencing psychological or physiological distress.  80%  STG: 1.Identify and engage in pleasant activities on a daily basis..  80% 2.Identify, challenge, and replace biased, fearful self-talk with positive, realistic, and empowering self talk 3.  Participate in Cognitive Therapy to help identify, challenge, and replace biased, negative, and selfdefeating thoughts resulting from the trauma.  80 % 4.  Participate in Eye Movement Desensitization and Reprocessing (EMDR) to reduce emotional distress  related to traumatic thoughts, feelings, and images.  90%  Target Date:  02/17/2023 Frequency: Biweekly Modality:Individual  Therapy Interventions by Therapist:  CBT, EMDR, insight oriented therapy, problem solving  Patient approved treatment plan and is progressing nicely.  Paige Macdougal G Paige Hanger, LCSW

## 2022-11-11 DIAGNOSIS — Z0000 Encounter for general adult medical examination without abnormal findings: Secondary | ICD-10-CM | POA: Diagnosis not present

## 2022-11-11 DIAGNOSIS — E039 Hypothyroidism, unspecified: Secondary | ICD-10-CM | POA: Diagnosis not present

## 2022-11-11 DIAGNOSIS — M7989 Other specified soft tissue disorders: Secondary | ICD-10-CM | POA: Diagnosis not present

## 2022-11-11 DIAGNOSIS — R600 Localized edema: Secondary | ICD-10-CM | POA: Diagnosis not present

## 2022-11-11 DIAGNOSIS — Z1331 Encounter for screening for depression: Secondary | ICD-10-CM | POA: Diagnosis not present

## 2022-11-11 DIAGNOSIS — Z6836 Body mass index (BMI) 36.0-36.9, adult: Secondary | ICD-10-CM | POA: Diagnosis not present

## 2022-11-11 DIAGNOSIS — J300 Vasomotor rhinitis: Secondary | ICD-10-CM | POA: Diagnosis not present

## 2022-11-11 DIAGNOSIS — E785 Hyperlipidemia, unspecified: Secondary | ICD-10-CM | POA: Diagnosis not present

## 2022-11-11 DIAGNOSIS — Z79899 Other long term (current) drug therapy: Secondary | ICD-10-CM | POA: Diagnosis not present

## 2022-11-11 DIAGNOSIS — K5903 Drug induced constipation: Secondary | ICD-10-CM | POA: Diagnosis not present

## 2022-11-11 DIAGNOSIS — M79604 Pain in right leg: Secondary | ICD-10-CM | POA: Diagnosis not present

## 2022-11-13 ENCOUNTER — Ambulatory Visit (INDEPENDENT_AMBULATORY_CARE_PROVIDER_SITE_OTHER): Payer: Medicare HMO | Admitting: Psychology

## 2022-11-13 DIAGNOSIS — F4310 Post-traumatic stress disorder, unspecified: Secondary | ICD-10-CM | POA: Diagnosis not present

## 2022-11-13 DIAGNOSIS — F411 Generalized anxiety disorder: Secondary | ICD-10-CM

## 2022-11-13 DIAGNOSIS — F331 Major depressive disorder, recurrent, moderate: Secondary | ICD-10-CM | POA: Diagnosis not present

## 2022-11-14 NOTE — Progress Notes (Signed)
[facility] Counselor/Therapist Progress Note  Patient ID: Paige [name], MRN: [id number],    Date: 11/13/2022  Time Spent: 60 minutes  Treatment Type: Individual Therapy  Reported Symptoms: depression and worry  Mental Status Exam: Appearance:  Casual     Behavior: Appropriate  Motor: Normal  Speech/Language:  Normal Rate  Affect: blunted  Mood: pleasant  Thought process: normal  Thought content:   WNL  Sensory/Perceptual disturbances:   WNL  Orientation: oriented to person, place, time/date, and situation  Attention: Good  Concentration: Good  Memory: WNL  Fund of knowledge:  Good  Insight:   Good  Judgment:  Fair  Impulse Control: Fair   Risk Assessment: Danger to Self:  No Self-injurious Behavior: No Danger to Others: No Duty to Warn:no Physical Aggression / Violence:No  Access to Firearms a concern: No  Gang Involvement:No   Subjective: The patient attended a face-to-face individual therapy session via video visit.  The patient gave verbal consent for this session to be on video on WebEx.  The patient was in her home alone and therapist was in the office alone.  The patient presents with a blunted affect and mood was depressed.  The patient states that she continues to miss her old boyfriend.  I asked her to really think about whether it is that she misses him or she just misses having someone.  We talked again about the things that she was not happy with when she was dating him.  He was very opinionated and wanted her to change a lot.  She continues to spend time with a person who is trans and she states that she does not want him to be perceived as her boyfriend.  I explained to her that he will be perceived as her boyfriend because they spend so much time together.  We talked about the possibility of her going back on line again to see if she could find someone to date and I explained to her that if she finds someone today that no one is going to  want to have her spending so much time with this person doing crafts and taking walks every day.  The patient seemed to understand what we talked about . I think she is not wanting to let go of this other relationship because she does not have another person to take the spot.  We will continue to help patient sort through what she wants to do moving forward. Interventions: Cognitive Behavioral Therapy  Diagnosis:PTSD (post-traumatic stress disorder)  Major depressive disorder, recurrent episode, moderate (HCC)  Generalized anxiety disorder  Plan: Treatment Plan  Strengths/Abilities:  Intelligent, kind, motivated  Treatment Preferences:  Outpatient Individual therapy  Statement of Needs:  "I need some help with getting back out into the world and learn how to interact and be social"   Symptoms:  Describes a reliving of the event, particularly through dissociative flashbacks.:  (Status: improved). Displays a significant decline in interest and engagement in activities.: (Status: improved). Displays significant psychological and/or physiological  distress resulting from internal and external clues that are reminiscent of the traumatic event.:  (Status: improved). Experiences disturbances in sleep.:   (Status: improved). Experiences disturbing and persistent thoughts, images, and/or perceptions of the  traumatic event.: (Status: improved). Experiences frequent nightmares.:  (Status: improved). Has been exposed to a traumatic event involving actual or  perceived threat of death or serious injury.: (Status: maintained).  Hypervigilance (e.g., feeling constantly on edge, experiencing concentration difficulties, having trouble falling or  staying asleep, exhibiting a general state of irritability).: (Status:  improved). Impairment in social, occupational, or other areas of functioning.:  (Status: improved). Intentionally avoids activities, places, people, or objects (e.g., up-armored vehicles) that  evoke memories of the event.:  (Status: improved). Intentionally avoids  thoughts, feelings, or discussions related to the traumatic event.: (Status:  improved). Reports difficulty concentrating as well as feelings of guilt.:   (Status: improved). Reports response of intense fear, helplessness, or horror to the traumatic event.: (Status: improved). Symptoms present more than one month.:  (Status: maintained).    Problems Addressed:  Anxiety, Posttraumatic Stress Disorder (PTSD),  Goals:  LTG:  1. Enhance ability to effectively cope with the full variety of life's worries  and anxieties.  70% 2. No longer avoids persons, places, activities, and objects that are  reminiscent of the traumatic event.  70% 3. No longer experiences intrusive event recollections, avoidance of event  reminders, intense arousal, or disinterest in activities or  relationships.  70% 4. Stabilize anxiety level while increasing ability to function on a daily  basis.  80% 5. Thinks about or openly discusses the traumatic event with others  without experiencing psychological or physiological distress.  80%  STG: 1.Identify and engage in pleasant activities on a daily basis..  80% 2.Identify, challenge, and replace biased, fearful self-talk with positive, realistic, and empowering self talk 3.  Participate in Cognitive Therapy to help identify, challenge, and replace biased, negative, and selfdefeating thoughts resulting from the trauma.  80 % 4.  Participate in Eye Movement Desensitization and Reprocessing (EMDR) to reduce emotional distress  related to traumatic thoughts, feelings, and images.  90%  Target Date:  02/17/2023 Frequency: Biweekly Modality:Individual Therapy Interventions by Therapist:  CBT, EMDR, insight oriented therapy, problem solving  Patient approved treatment plan and is progressing nicely.  Bilal Manzer G Mike Berntsen,  LCSW

## 2022-11-29 ENCOUNTER — Telehealth: Payer: Self-pay

## 2022-11-29 ENCOUNTER — Other Ambulatory Visit (HOSPITAL_COMMUNITY): Payer: Self-pay

## 2022-11-29 NOTE — Telephone Encounter (Signed)
Pharmacy Patient Advocate Encounter  Prior Authorization for Aimovig '70MG'$ /ML auto-injectors has been approved.    PA# [id number] Effective dates: 12/02/2021 through 12/02/2023.

## 2022-12-04 ENCOUNTER — Encounter: Payer: Self-pay | Admitting: Adult Health

## 2022-12-04 ENCOUNTER — Telehealth (INDEPENDENT_AMBULATORY_CARE_PROVIDER_SITE_OTHER): Payer: Medicare HMO | Admitting: Adult Health

## 2022-12-04 ENCOUNTER — Ambulatory Visit (INDEPENDENT_AMBULATORY_CARE_PROVIDER_SITE_OTHER): Payer: Medicare HMO | Admitting: Psychology

## 2022-12-04 DIAGNOSIS — F4310 Post-traumatic stress disorder, unspecified: Secondary | ICD-10-CM

## 2022-12-04 DIAGNOSIS — F411 Generalized anxiety disorder: Secondary | ICD-10-CM

## 2022-12-04 DIAGNOSIS — G4700 Insomnia, unspecified: Secondary | ICD-10-CM | POA: Diagnosis not present

## 2022-12-04 DIAGNOSIS — F331 Major depressive disorder, recurrent, moderate: Secondary | ICD-10-CM

## 2022-12-04 NOTE — Progress Notes (Signed)
[facility] Counselor/Therapist Progress Note  Patient ID: Paige [name], MRN: [id number],    Date: 12/04/2022  Time Spent: 60 minutes  Treatment Type: Individual Therapy  Reported Symptoms: depression and worry  Mental Status Exam: Appearance:  Casual     Behavior: Appropriate  Motor: Normal  Speech/Language:  Normal Rate  Affect: blunted  Mood: sad  Thought process: normal  Thought content:   WNL  Sensory/Perceptual disturbances:   WNL  Orientation: oriented to person, place, time/date, and situation  Attention: Good  Concentration: Good  Memory: WNL  Fund of knowledge:  Good  Insight:   Good  Judgment:  Fair  Impulse Control: Fair   Risk Assessment: Danger to Self:  No Self-injurious Behavior: No Danger to Others: No Duty to Warn:no Physical Aggression / Violence:No  Access to Firearms a concern: No  Gang Involvement:No   Subjective: The patient attended a face-to-face individual therapy session via video visit.  The patient gave verbal consent for this session to be on video on WebEx.  The patient was in her home alone and therapist was in the office alone.  The patient presents with a blunted affect and mood was depressed.  The patient states that her cat had a kidney problem and had to be euthanized.  We talked about the sadness about that.  In addition the patient says that her old boyfriend has started calling again and she still continues with her relationship with the trans female that is her friend but is they are all the time.  She does not want a relationship with the trans female as a boyfriend however she is spending all of her time with him.  She feels like she misses her old boyfriend and I am not sure that he is necessarily a good choice for her however I encouraged her to look at the situation and set some limits around both situations.  Her ex-boyfriend does not want her hanging around with the trans female that she has been hanging around  with.  Her ex-boyfriend also is very critical of her and her belief system.  We talked about this and I explained to her that she is going to have to set some limits around it or maybe start all over and trying to find a relationship.  I also recommended that she set limits with her trans female friend that she only wants to see him once or twice a week because it is being perceived that she is dating this person.  I explained to her that no person that is going to date her is going to want her spending all day every day with this person other person.  The patient understood the concepts discussed and we are scheduled in another 3 weeks.  Interventions: Cognitive Behavioral Therapy  Diagnosis:PTSD (post-traumatic stress disorder)  Generalized anxiety disorder  Major depressive disorder, recurrent episode, moderate (HCC)  Plan: Treatment Plan  Strengths/Abilities:  Intelligent, kind, motivated  Treatment Preferences:  Outpatient Individual therapy  Statement of Needs:  "I need some help with getting back out into the world and learn how to interact and be social"   Symptoms:  Describes a reliving of the event, particularly through dissociative flashbacks.:  (Status: improved). Displays a significant decline in interest and engagement in activities.: (Status: improved). Displays significant psychological and/or physiological  distress resulting from internal and external clues that are reminiscent of the traumatic event.:  (Status: improved). Experiences disturbances in sleep.:   (Status: improved). Experiences disturbing and  persistent thoughts, images, and/or perceptions of the  traumatic event.: (Status: improved). Experiences frequent nightmares.:  (Status: improved). Has been exposed to a traumatic event involving actual or  perceived threat of death or serious injury.: (Status: maintained).  Hypervigilance (e.g., feeling constantly on edge, experiencing concentration difficulties, having  trouble falling or staying asleep, exhibiting a general state of irritability).: (Status:  improved). Impairment in social, occupational, or other areas of functioning.:  (Status: improved). Intentionally avoids activities, places, people, or objects (e.g., up-armored vehicles) that evoke memories of the event.:  (Status: improved). Intentionally avoids  thoughts, feelings, or discussions related to the traumatic event.: (Status:  improved). Reports difficulty concentrating as well as feelings of guilt.:   (Status: improved). Reports response of intense fear, helplessness, or horror to the traumatic event.: (Status: improved). Symptoms present more than one month.:  (Status: maintained).    Problems Addressed:  Anxiety, Posttraumatic Stress Disorder (PTSD),  Goals:  LTG:  1. Enhance ability to effectively cope with the full variety of life's worries  and anxieties.  70% 2. No longer avoids persons, places, activities, and objects that are  reminiscent of the traumatic event.  70% 3. No longer experiences intrusive event recollections, avoidance of event  reminders, intense arousal, or disinterest in activities or  relationships.  70% 4. Stabilize anxiety level while increasing ability to function on a daily  basis.  80% 5. Thinks about or openly discusses the traumatic event with others  without experiencing psychological or physiological distress.  80%  STG: 1.Identify and engage in pleasant activities on a daily basis..  80% 2.Identify, challenge, and replace biased, fearful self-talk with positive, realistic, and empowering self talk 3.  Participate in Cognitive Therapy to help identify, challenge, and replace biased, negative, and selfdefeating thoughts resulting from the trauma.  80 % 4.  Participate in Eye Movement Desensitization and Reprocessing (EMDR) to reduce emotional distress  related to traumatic thoughts, feelings, and images.  90%  Target Date:  02/17/2023 Frequency:  Biweekly Modality:Individual Therapy Interventions by Therapist:  CBT, EMDR, insight oriented therapy, problem solving  Patient approved treatment plan and is progressing nicely.  Trevion Hoben G Corryn Madewell, LCSW

## 2022-12-04 NOTE — Progress Notes (Signed)
Paige [name] [id number] 03/15/1966 57 y.o.  Virtual Visit via Video Note  I connected with pt @ on 12/04/22 at  9:40 AM EST by a video enabled telemedicine application and verified that I am speaking with the correct person using two identifiers.   I discussed the limitations of evaluation and management by telemedicine and the availability of in person appointments. The patient expressed understanding and agreed to proceed.  I discussed the assessment and treatment plan with the patient. The patient was provided an opportunity to ask questions and all were answered. The patient agreed with the plan and demonstrated an understanding of the instructions.   The patient was advised to call back or seek an in-person evaluation if the symptoms worsen or if the condition fails to improve as anticipated.  I provided 25 minutes of non-face-to-face time during this encounter.  The patient was located at home.  The provider was located at [place].   Aloha Gell, NP   Subjective:   Patient ID:  Paige [name] is a 57 y.o. (DOB 12-29-1965) female.  Chief Complaint: No chief complaint on file.   HPI LAKSHMI SUNDEEN presents for follow-up of GAD, MDD, PTSD and insomnia.  Describes mood today as "not too good". Pleasant. Increased tearfulness. Mood symptoms - reports some depression - had to put her cat down last week - thinking of father over the holidays - relationship issues. Reports anxiety and irritability. Reports worry, rumination and over thinking - "I do that all the time". Mood is lower. Stating "I'm having a difficult time". Feels like medications are helpful. Continues to see therapist Chiropractor). Making medications as prescribed.  Energy levels "off and on". Active, walking most days. Enjoys some usual interests and activities. Single. Lives alone with cat - "Paige [name]" - and 2 outdoor cats - "Paige [name] and Paige [name]". Mother local and supportive. Attends  church - singing in the choir. Appetite adequate. Weight gain - 231 to 241 pounds. Sleeps better some nights than others. Averages 5 to 6 hours. Focus and concentration difficulties. Getting some things done around the house. Disabled since 2009. Denies SI or HI.  Denies AH or VH. Denies self harm. Denies substance use.  Previous medication trials: Lexapro, Paxil, Trazadone, Wellbutrin SR, Geodon, Seroquel, Risperdal, Depakote, Topamax, Lithium, Provigil, Ambien, Cytomel, Clonazepam and other benzodixepaines, Elavil, Effexor, Remeron, Prozac, Anafranil, Tofranil, Zoloft, Geodon, Buspar, Adderall, Ritalin, Abilify, Rexulti, Lamictal, Latuda  Review of Systems:  Review of Systems  Musculoskeletal:  Negative for gait problem.  Neurological:  Negative for tremors.  Psychiatric/Behavioral:         Please refer to HPI    Medications: I have reviewed the patient's current medications.  Current Outpatient Medications  Medication Sig Dispense Refill   buPROPion (WELLBUTRIN XL) 150 MG 24 hr tablet TAKE 3 TABLETS EVERY MORNING 270 tablet 3   cariprazine (VRAYLAR) 3 MG capsule Take 1 capsule (3 mg total) by mouth daily. 90 capsule 3   cetirizine (ZYRTEC) 10 MG tablet Take by mouth.     clonazePAM (KLONOPIN) 0.5 MG tablet Take 1 tablet (0.5 mg total) by mouth 2 (two) times daily as needed for anxiety. 30 tablet 2   dicyclomine (BENTYL) 10 MG capsule Take 1 capsule (10 mg total) by mouth 3 (three) times daily as needed for spasms. 90 capsule 11   DULoxetine (CYMBALTA) 60 MG capsule Take 1 capsule (60 mg total) by mouth 2 (two) times daily. 180 capsule 3   Erenumab-aooe (AIMOVIG) 70 MG/ML  SOAJ INJECT 70 MG INTO THE SKIN EVERY 28 DAYS. 1 mL 11   Fe Fum-Fe Poly-Vit C-Lactobac (FUSION) 65-65-25-30 MG CAPS Take by mouth.     ipratropium (ATROVENT) 0.06 % nasal spray      levothyroxine (SYNTHROID, LEVOTHROID) 88 MCG tablet Take 88 mcg by mouth daily before breakfast.     liothyronine (CYTOMEL) 5 MCG  tablet Take 5 mcg by mouth daily. Monday Wednesday Friday     nystatin cream (MYCOSTATIN) Apply 1 application  topically daily as needed for dry skin.     omeprazole (PRILOSEC) 20 MG capsule      ondansetron (ZOFRAN-ODT) 4 MG disintegrating tablet Take 1 tablet (4 mg total) by mouth every 8 (eight) hours as needed for nausea or vomiting. 20 tablet 6   Oxycodone HCl 10 MG TABS Take 10 mg by mouth every 6 (six) hours as needed.     Polyethyl Glycol-Propyl Glycol 0.4-0.3 % SOLN Apply 1 drop to eye as needed.     polyethylene glycol (MIRALAX / GLYCOLAX) 17 g packet Take 17 g by mouth 2 (two) times daily.     potassium chloride (K-DUR,KLOR-CON) 10 MEQ tablet Take 1 tablet by mouth daily.     progesterone (PROMETRIUM) 200 MG capsule Take 200 mg by mouth at bedtime.     promethazine (PHENERGAN) 12.5 MG tablet Take 1 tablet (12.5 mg total) by mouth every 8 (eight) hours as needed for nausea or vomiting. 30 tablet 0   rOPINIRole (REQUIP) 2 MG tablet Take 2 mg by mouth 2 (two) times daily.     terconazole (TERAZOL 3) 0.8 % vaginal cream Place 1 applicator vaginally at bedtime.     tiZANidine (ZANAFLEX) 2 MG tablet Take 2 mg by mouth 3 (three) times daily.     torsemide (DEMADEX) 20 MG tablet Take 20 mg by mouth daily.      traZODone (DESYREL) 100 MG tablet Take 2 tablets (200 mg total) by mouth at bedtime. 180 tablet 3   Ubrogepant (UBRELVY) 100 MG TABS Take 1 tablet by mouth daily as needed (at onset of headaches may repeat dose after 2 hours as needed max 200 mg in 24 hours). 16 tablet 11   Current Facility-Administered Medications  Medication Dose Route Frequency Provider Last Rate Last Admin   0.9 %  sodium chloride infusion  500 mL Intravenous Once Ladene Artist, MD        Medication Side Effects: None  Allergies:  Allergies  Allergen Reactions   Cyclobenzaprine Other (See Comments)    Other reaction(s): Unknown Hallucinations Unknown    Meloxicam Other (See Comments)   Naltrexone  Other (See Comments)    SEVERE PAIN ALL OVER   Pork (Porcine) Protein Other (See Comments)    Other reaction(s): Other (See Comments) Migraines Other reaction(s): Other (See Comments) Migraines   Sumatriptan Other (See Comments)    Difficulty breathing Other reaction(s): Other (see comments) Off balance    Tape Dermatitis   Flexeril [Cyclobenzaprine Hcl]    Imitrex [Sumatriptan Base]    Other    Sulfa Antibiotics     Pt states she does not think she is allergic to Sulfa     Past Medical History:  Diagnosis Date   Allergic rhinitis    Allergy    Anemia    Anxiety disorder    Cancer (HCC)    skin   Chronic fatigue    Chronic headaches    Chronic pain    Depression    Endometriosis  Family history of colon cancer    mother,uncles,aunts   Fibromyalgia    Gallstones    GERD (gastroesophageal reflux disease)    Hypothyroidism    IBS (irritable bowel syndrome)    Insomnia    Iron deficiency    Laryngopharyngeal reflux (LPR)    Obesity    Personal history of colonic polyps 04/2006   polypoid   PONV (postoperative nausea and vomiting)    RLS (restless legs syndrome)    Sleep apnea    no cpap    Family History  Problem Relation Age of Onset   Colon cancer Mother        uncles,aunts   Diabetes Father        paternal grandmother,sister   Leukemia Father    Lymphoma Father    Skin cancer Father    Heart disease Father    Colon polyps Sister    Diabetes Sister    Colon cancer Maternal Aunt    Colon cancer Maternal Uncle    Colon polyps Cousin    Stomach cancer Neg Hx    Esophageal cancer Neg Hx    Rectal cancer Neg Hx     Social History   Socioeconomic History   Marital status: Single    Spouse name: Not on file   Number of children: 0   Years of education: Not on file   Highest education level: Not on file  Occupational History   Occupation: RN/disabled  Tobacco Use   Smoking status: Never   Smokeless tobacco: Never  Vaping Use   Vaping Use:  Never used  Substance and Sexual Activity   Alcohol use: Not Currently    Alcohol/week: 0.0 standard drinks of alcohol    Comment: rarely   Drug use: No   Sexual activity: Not on file  Other Topics Concern   Not on file  Social History Narrative   Patient is left-handed. She lives alone in a one level home. She has 3 cats. She is unable to exercise. Caffeine one soda / day. On disability now. 2 bachelor degrees   Social Determinants of Health   Financial Resource Strain: Not on file  Food Insecurity: Not on file  Transportation Needs: Not on file  Physical Activity: Not on file  Stress: Not on file  Social Connections: Not on file  Intimate Partner Violence: Not on file    Past Medical History, Surgical history, Social history, and Family history were reviewed and updated as appropriate.   Please see review of systems for further details on the patient's review from today.   Objective:   Physical Exam:  There were no vitals taken for this visit.  Physical Exam Constitutional:      General: She is not in acute distress. Musculoskeletal:        General: No deformity.  Neurological:     Mental Status: She is alert and oriented to person, place, and time.     Coordination: Coordination normal.  Psychiatric:        Attention and Perception: Attention and perception normal. She does not perceive auditory or visual hallucinations.        Mood and Affect: Mood normal. Mood is not anxious or depressed. Affect is not labile, blunt, angry or inappropriate.        Speech: Speech normal.        Behavior: Behavior normal.        Thought Content: Thought content normal. Thought content is not paranoid or delusional. Thought  content does not include homicidal or suicidal ideation. Thought content does not include homicidal or suicidal plan.        Cognition and Memory: Cognition and memory normal.        Judgment: Judgment normal.     Comments: Insight intact     Lab Review:      Component Value Date/Time   NA 140 05/26/2019 1046   K 3.4 (L) 05/26/2019 1046   CL 95 (L) 05/26/2019 1046   CO2 37 (H) 05/26/2019 1046   GLUCOSE 97 05/26/2019 1046   BUN 17 05/26/2019 1046   CREATININE 1.09 05/26/2019 1046   CALCIUM 9.3 05/26/2019 1046   PROT 6.9 05/26/2019 1046   ALBUMIN 4.2 05/26/2019 1046   AST 36 05/26/2019 1046   ALT 30 05/26/2019 1046   ALKPHOS 98 05/26/2019 1046   BILITOT 0.4 05/26/2019 1046       Component Value Date/Time   WBC 6.9 05/26/2019 1046   RBC 4.42 05/26/2019 1046   HGB 12.6 05/26/2019 1046   HCT 38.7 05/26/2019 1046   PLT 209.0 05/26/2019 1046   MCV 87.6 05/26/2019 1046   MCHC 32.5 05/26/2019 1046   RDW 14.6 05/26/2019 1046   LYMPHSABS 2.0 05/26/2019 1046   MONOABS 0.6 05/26/2019 1046   EOSABS 0.1 05/26/2019 1046   BASOSABS 0.0 05/26/2019 1046    No results found for: "POCLITH", "LITHIUM"   No results found for: "PHENYTOIN", "PHENOBARB", "VALPROATE", "CBMZ"   .res Assessment: Plan:    Plan:  1. Wellbutrin XL '450mg'$  daily - in the am  2. Cymbalta '60mg'$  BID 3. Trazadone '100mg'$  - 2 at hs  4. Vraylar '3mg'$  daily though patient assistance. 5. Clonazepam 0.'5mg'$  BID  Continue therapy with Bambi Cottle.   RTC 6 weeks  Patient advised to contact office with any questions, adverse effects, or acute worsening in signs and symptoms.  Discussed potential metabolic side effects associated with atypical antipsychotics, as well as potential risk for movement side effects. Advised pt to contact office if movement side effects occur.   Discussed potential benefits, risk, and side effects of benzodiazepines to include potential risk of tolerance and dependence, as well as possible drowsiness.  Advised patient not to drive if experiencing drowsiness and to take lowest possible effective dose to minimize risk of dependence and tolerance.   Diagnoses and all orders for this visit:  Major depressive disorder, recurrent episode, moderate  (HCC)  Generalized anxiety disorder  PTSD (post-traumatic stress disorder)  Insomnia, unspecified type     Please see After Visit Summary for patient specific instructions.  Future Appointments  Date Time Provider [place]  12/04/2022  2:00 PM Cottle, Lucious Groves, LCSW [facility] None  12/25/2022  2:00 PM Cottle, Bambi G, LCSW [facility] None  01/15/2023  2:00 PM Cottle, Bambi G, LCSW [facility] None  02/05/2023  2:00 PM Cottle, Bambi G, LCSW [facility] None  02/26/2023  2:00 PM Cottle, Bambi G, LCSW [facility] None  09/10/2023  9:30 AM [name], Adam R, DO [facility] None    No orders of the defined types were placed in this encounter.     -------------------------------

## 2022-12-10 DIAGNOSIS — N951 Menopausal and female climacteric states: Secondary | ICD-10-CM | POA: Diagnosis not present

## 2022-12-10 DIAGNOSIS — R5383 Other fatigue: Secondary | ICD-10-CM | POA: Diagnosis not present

## 2022-12-12 DIAGNOSIS — Z6836 Body mass index (BMI) 36.0-36.9, adult: Secondary | ICD-10-CM | POA: Diagnosis not present

## 2022-12-12 DIAGNOSIS — Z79890 Hormone replacement therapy (postmenopausal): Secondary | ICD-10-CM | POA: Diagnosis not present

## 2022-12-12 DIAGNOSIS — R6882 Decreased libido: Secondary | ICD-10-CM | POA: Diagnosis not present

## 2022-12-12 DIAGNOSIS — N898 Other specified noninflammatory disorders of vagina: Secondary | ICD-10-CM | POA: Diagnosis not present

## 2022-12-12 DIAGNOSIS — N951 Menopausal and female climacteric states: Secondary | ICD-10-CM | POA: Diagnosis not present

## 2022-12-12 DIAGNOSIS — N760 Acute vaginitis: Secondary | ICD-10-CM | POA: Diagnosis not present

## 2022-12-18 DIAGNOSIS — Z79899 Other long term (current) drug therapy: Secondary | ICD-10-CM | POA: Diagnosis not present

## 2022-12-25 ENCOUNTER — Ambulatory Visit (INDEPENDENT_AMBULATORY_CARE_PROVIDER_SITE_OTHER): Payer: Medicare HMO | Admitting: Psychology

## 2022-12-25 DIAGNOSIS — F4310 Post-traumatic stress disorder, unspecified: Secondary | ICD-10-CM

## 2022-12-25 DIAGNOSIS — F331 Major depressive disorder, recurrent, moderate: Secondary | ICD-10-CM | POA: Diagnosis not present

## 2022-12-25 DIAGNOSIS — F411 Generalized anxiety disorder: Secondary | ICD-10-CM

## 2022-12-25 NOTE — Progress Notes (Signed)
[facility] Counselor/Therapist Progress Note  Patient ID: MERLINDA WRUBEL, MRN: [id number],    Date: 12/25/2022  Time Spent: 60 minutes  Treatment Type: Individual Therapy  Reported Symptoms: depression and worry  Mental Status Exam: Appearance:  Casual     Behavior: Appropriate  Motor: Normal  Speech/Language:  Normal Rate  Affect: blunted  Mood: sad  Thought process: normal  Thought content:   WNL  Sensory/Perceptual disturbances:   WNL  Orientation: oriented to person, place, time/date, and situation  Attention: Good  Concentration: Good  Memory: WNL  Fund of knowledge:  Good  Insight:   Good  Judgment:  Fair  Impulse Control: Fair   Risk Assessment: Danger to Self:  No Self-injurious Behavior: No Danger to Others: No Duty to Warn:no Physical Aggression / Violence:No  Access to Firearms a concern: No  Gang Involvement:No   Subjective: The patient attended a face-to-face individual therapy session via video visit.  The patient gave verbal consent for this session to be on video on WebEx.  The patient was in her home alone and therapist was in the office alone.  The patient presents with a blunted affect and mood was depressed.  The patient reports that she feels like she has been having trouble with her memory.  During the session I observed her looking off into space and it appears that she may be dissociating again.  She is currently dealing with a situation with her ex-boyfriend who is contacting her on a daily basis and actually showed up to her house and just walked in.  We talked about this being a very big stressor for her as she is having difficulty letting go of that relationship because it is not healthy for her.  We talked about him having no boundaries and obviously it is almost like he is stalking her and being critical of her.  I coached her into basically setting very firm limits with him that she can no longer have any contact with him  because he is no longer good for her mental health.  I feel like this is triggered her dissociation and that she feels helpless and that it may be undoing some of the work that we have already done to get her where she is as far as interacting and functioning.  I will see her again in 3 weeks and hopefully she will have set a limit with her ex significant other by the end.  I told her to continue to monitor her and if after she has set those limits, she is having memory issues we would have her tested.  Interventions: Cognitive Behavioral Therapy  Diagnosis:PTSD (post-traumatic stress disorder)  Generalized anxiety disorder  Major depressive disorder, recurrent episode, moderate (HCC)  Plan: Treatment Plan  Strengths/Abilities:  Intelligent, kind, motivated  Treatment Preferences:  Outpatient Individual therapy  Statement of Needs:  "I need some help with getting back out into the world and learn how to interact and be social"   Symptoms:  Describes a reliving of the event, particularly through dissociative flashbacks.:  (Status: improved). Displays a significant decline in interest and engagement in activities.: (Status: improved). Displays significant psychological and/or physiological  distress resulting from internal and external clues that are reminiscent of the traumatic event.:  (Status: improved). Experiences disturbances in sleep.:   (Status: improved). Experiences disturbing and persistent thoughts, images, and/or perceptions of the  traumatic event.: (Status: improved). Experiences frequent nightmares.:  (Status: improved). Has been exposed to a traumatic  event involving actual or  perceived threat of death or serious injury.: (Status: maintained).  Hypervigilance (e.g., feeling constantly on edge, experiencing concentration difficulties, having trouble falling or staying asleep, exhibiting a general state of irritability).: (Status:  improved). Impairment in social,  occupational, or other areas of functioning.:  (Status: improved). Intentionally avoids activities, places, people, or objects (e.g., up-armored vehicles) that evoke memories of the event.:  (Status: improved). Intentionally avoids  thoughts, feelings, or discussions related to the traumatic event.: (Status:  improved). Reports difficulty concentrating as well as feelings of guilt.:   (Status: improved). Reports response of intense fear, helplessness, or horror to the traumatic event.: (Status: improved). Symptoms present more than one month.:  (Status: maintained).    Problems Addressed:  Anxiety, Posttraumatic Stress Disorder (PTSD),  Goals:  LTG:  1. Enhance ability to effectively cope with the full variety of life's worries  and anxieties.  70% 2. No longer avoids persons, places, activities, and objects that are  reminiscent of the traumatic event.  70% 3. No longer experiences intrusive event recollections, avoidance of event  reminders, intense arousal, or disinterest in activities or  relationships.  70% 4. Stabilize anxiety level while increasing ability to function on a daily  basis.  80% 5. Thinks about or openly discusses the traumatic event with others  without experiencing psychological or physiological distress.  80%  STG: 1.Identify and engage in pleasant activities on a daily basis..  80% 2.Identify, challenge, and replace biased, fearful self-talk with positive, realistic, and empowering self talk 3.  Participate in Cognitive Therapy to help identify, challenge, and replace biased, negative, and selfdefeating thoughts resulting from the trauma.  80 % 4.  Participate in Eye Movement Desensitization and Reprocessing (EMDR) to reduce emotional distress  related to traumatic thoughts, feelings, and images.  90%  Target Date:  02/17/2023 Frequency: Biweekly Modality:Individual Therapy Interventions by Therapist:  CBT, EMDR, insight oriented therapy, problem  solving  Patient approved treatment plan and is progressing nicely.  Axtyn Woehler G Nikiah Goin, LCSW

## 2023-01-10 ENCOUNTER — Telehealth (INDEPENDENT_AMBULATORY_CARE_PROVIDER_SITE_OTHER): Payer: Medicare HMO | Admitting: Adult Health

## 2023-01-10 ENCOUNTER — Encounter: Payer: Self-pay | Admitting: Adult Health

## 2023-01-10 DIAGNOSIS — F411 Generalized anxiety disorder: Secondary | ICD-10-CM | POA: Diagnosis not present

## 2023-01-10 DIAGNOSIS — G4700 Insomnia, unspecified: Secondary | ICD-10-CM

## 2023-01-10 DIAGNOSIS — F4310 Post-traumatic stress disorder, unspecified: Secondary | ICD-10-CM

## 2023-01-10 DIAGNOSIS — F331 Major depressive disorder, recurrent, moderate: Secondary | ICD-10-CM

## 2023-01-10 NOTE — Progress Notes (Addendum)
Paige [name] [id number] Mar 06, 1966 57 y.o.  Virtual Visit via Video Note  I connected with pt @ on 01/10/23 at  8:40 AM EST by a video enabled telemedicine application and verified that I am speaking with the correct person using two identifiers.   I discussed the limitations of evaluation and management by telemedicine and the availability of in person appointments. The patient expressed understanding and agreed to proceed.  I discussed the assessment and treatment plan with the patient. The patient was provided an opportunity to ask questions and all were answered. The patient agreed with the plan and demonstrated an understanding of the instructions.   The patient was advised to call back or seek an in-person evaluation if the symptoms worsen or if the condition fails to improve as anticipated.  I provided 25 minutes of non-face-to-face time during this encounter.  The patient was located at home.  The provider was located at [place].   Paige Gell, NP   Subjective:   Patient ID:  Paige [name] is a 57 y.o. (DOB 12/05/1965) female.  Chief Complaint: No chief complaint on file.   HPI Paige [name] presents for follow-up of GAD, MDD, PTSD and insomnia.  Describes mood today as "so-so". Pleasant. Reports tearfulness. Mood symptoms - reports some depression - "not bad". Stating "I still want to do things". Reports some anxiety and irritability. Reports worry, rumination and over thinking - "a little bit". Mood is lower. Stating "I'm doing ok, not quite where I need". Feels like medications are helpful. Continues to see therapist Chiropractor). Making medications as prescribed.  Energy levels lower. Active, walking most days. Enjoys some usual interests and activities. Single. Lives alone with cat - "Paige [name]" - and 2 outdoor cats - "Paige [name] and Paige [name]". Mother local and supportive. Attends church. Appetite adequate. Weight stable - 230 pounds. Sleeps  better some nights than others. Averages 5 to 6 hours. Focus and concentration difficulties - "a lot of difficulties". Struggles with doing day to day things. Reports forgetfulness. Getting some things done around the house. Disabled since 2009. Denies SI or HI.  Denies AH or VH. Denies self harm. Denies substance use.  Previous medication trials: Lexapro, Paxil, Trazadone, Wellbutrin SR, Geodon, Seroquel, Risperdal, Depakote, Topamax, Lithium, Provigil, Ambien, Cytomel, Clonazepam and other benzodixepaines, Elavil, Effexor, Remeron, Prozac, Anafranil, Tofranil, Zoloft, Geodon, Buspar, Adderall, Ritalin, Abilify, Rexulti, Lamictal, Latuda   Review of Systems:  Review of Systems  Musculoskeletal:  Negative for gait problem.  Neurological:  Negative for tremors.  Psychiatric/Behavioral:         Please refer to HPI    Medications: I have reviewed the patient's current medications.  Current Outpatient Medications  Medication Sig Dispense Refill   buPROPion (WELLBUTRIN XL) 150 MG 24 hr tablet TAKE 3 TABLETS EVERY MORNING 270 tablet 3   cariprazine (VRAYLAR) 3 MG capsule Take 1 capsule (3 mg total) by mouth daily. 90 capsule 3   cetirizine (ZYRTEC) 10 MG tablet Take by mouth.     clonazePAM (KLONOPIN) 0.5 MG tablet Take 1 tablet (0.5 mg total) by mouth 2 (two) times daily as needed for anxiety. 30 tablet 2   dicyclomine (BENTYL) 10 MG capsule Take 1 capsule (10 mg total) by mouth 3 (three) times daily as needed for spasms. 90 capsule 11   DULoxetine (CYMBALTA) 60 MG capsule Take 1 capsule (60 mg total) by mouth 2 (two) times daily. 180 capsule 3   Erenumab-aooe (AIMOVIG) 70 MG/ML SOAJ INJECT 70 MG  INTO THE SKIN EVERY 28 DAYS. 1 mL 11   Fe Fum-Fe Poly-Vit C-Lactobac (FUSION) 65-65-25-30 MG CAPS Take by mouth.     ipratropium (ATROVENT) 0.06 % nasal spray      levothyroxine (SYNTHROID, LEVOTHROID) 88 MCG tablet Take 88 mcg by mouth daily before breakfast.     liothyronine (CYTOMEL) 5 MCG  tablet Take 5 mcg by mouth daily. Monday Wednesday Friday     nystatin cream (MYCOSTATIN) Apply 1 application  topically daily as needed for dry skin.     omeprazole (PRILOSEC) 20 MG capsule      ondansetron (ZOFRAN-ODT) 4 MG disintegrating tablet Take 1 tablet (4 mg total) by mouth every 8 (eight) hours as needed for nausea or vomiting. 20 tablet 6   Oxycodone HCl 10 MG TABS Take 10 mg by mouth every 6 (six) hours as needed.     Polyethyl Glycol-Propyl Glycol 0.4-0.3 % SOLN Apply 1 drop to eye as needed.     polyethylene glycol (MIRALAX / GLYCOLAX) 17 g packet Take 17 g by mouth 2 (two) times daily.     potassium chloride (K-DUR,KLOR-CON) 10 MEQ tablet Take 1 tablet by mouth daily.     progesterone (PROMETRIUM) 200 MG capsule Take 200 mg by mouth at bedtime.     promethazine (PHENERGAN) 12.5 MG tablet Take 1 tablet (12.5 mg total) by mouth every 8 (eight) hours as needed for nausea or vomiting. 30 tablet 0   rOPINIRole (REQUIP) 2 MG tablet Take 2 mg by mouth 2 (two) times daily.     terconazole (TERAZOL 3) 0.8 % vaginal cream Place 1 applicator vaginally at bedtime.     tiZANidine (ZANAFLEX) 2 MG tablet Take 2 mg by mouth 3 (three) times daily.     torsemide (DEMADEX) 20 MG tablet Take 20 mg by mouth daily.      traZODone (DESYREL) 100 MG tablet Take 2 tablets (200 mg total) by mouth at bedtime. 180 tablet 3   Ubrogepant (UBRELVY) 100 MG TABS Take 1 tablet by mouth daily as needed (at onset of headaches may repeat dose after 2 hours as needed max 200 mg in 24 hours). 16 tablet 11   Current Facility-Administered Medications  Medication Dose Route Frequency Provider Last Rate Last Admin   0.9 %  sodium chloride infusion  500 mL Intravenous Once Ladene Artist, MD        Medication Side Effects: None  Allergies:  Allergies  Allergen Reactions   Cyclobenzaprine Other (See Comments)    Other reaction(s): Unknown Hallucinations Unknown    Meloxicam Other (See Comments)   Naltrexone  Other (See Comments)    SEVERE PAIN ALL OVER   Pork (Porcine) Protein Other (See Comments)    Other reaction(s): Other (See Comments) Migraines Other reaction(s): Other (See Comments) Migraines   Sumatriptan Other (See Comments)    Difficulty breathing Other reaction(s): Other (see comments) Off balance    Tape Dermatitis   Flexeril [Cyclobenzaprine Hcl]    Imitrex [Sumatriptan Base]    Other    Sulfa Antibiotics     Pt states she does not think she is allergic to Sulfa     Past Medical History:  Diagnosis Date   Allergic rhinitis    Allergy    Anemia    Anxiety disorder    Cancer (HCC)    skin   Chronic fatigue    Chronic headaches    Chronic pain    Depression    Endometriosis    Family  history of colon cancer    mother,uncles,aunts   Fibromyalgia    Gallstones    GERD (gastroesophageal reflux disease)    Hypothyroidism    IBS (irritable bowel syndrome)    Insomnia    Iron deficiency    Laryngopharyngeal reflux (LPR)    Obesity    Personal history of colonic polyps 04/2006   polypoid   PONV (postoperative nausea and vomiting)    RLS (restless legs syndrome)    Sleep apnea    no cpap    Family History  Problem Relation Age of Onset   Colon cancer Mother        uncles,aunts   Diabetes Father        paternal grandmother,sister   Leukemia Father    Lymphoma Father    Skin cancer Father    Heart disease Father    Colon polyps Sister    Diabetes Sister    Colon cancer Maternal Aunt    Colon cancer Maternal Uncle    Colon polyps Cousin    Stomach cancer Neg Hx    Esophageal cancer Neg Hx    Rectal cancer Neg Hx     Social History   Socioeconomic History   Marital status: Single    Spouse name: Not on file   Number of children: 0   Years of education: Not on file   Highest education level: Not on file  Occupational History   Occupation: RN/disabled  Tobacco Use   Smoking status: Never   Smokeless tobacco: Never  Vaping Use   Vaping Use:  Never used  Substance and Sexual Activity   Alcohol use: Not Currently    Alcohol/week: 0.0 standard drinks of alcohol    Comment: rarely   Drug use: No   Sexual activity: Not on file  Other Topics Concern   Not on file  Social History Narrative   Patient is left-handed. She lives alone in a one level home. She has 3 cats. She is unable to exercise. Caffeine one soda / day. On disability now. 2 bachelor degrees   Social Determinants of Health   Financial Resource Strain: Not on file  Food Insecurity: Not on file  Transportation Needs: Not on file  Physical Activity: Not on file  Stress: Not on file  Social Connections: Not on file  Intimate Partner Violence: Not on file    Past Medical History, Surgical history, Social history, and Family history were reviewed and updated as appropriate.   Please see review of systems for further details on the patient's review from today.   Objective:   Physical Exam:  There were no vitals taken for this visit.  Physical Exam Constitutional:      General: She is not in acute distress. Musculoskeletal:        General: No deformity.  Neurological:     Mental Status: She is alert and oriented to person, place, and time.     Coordination: Coordination normal.  Psychiatric:        Attention and Perception: Attention and perception normal. She does not perceive auditory or visual hallucinations.        Mood and Affect: Mood normal. Mood is not anxious or depressed. Affect is not labile, blunt, angry or inappropriate.        Speech: Speech normal.        Behavior: Behavior normal.        Thought Content: Thought content normal. Thought content is not paranoid or delusional. Thought content  does not include homicidal or suicidal ideation. Thought content does not include homicidal or suicidal plan.        Cognition and Memory: Cognition and memory normal.        Judgment: Judgment normal.     Comments: Insight intact     Lab Review:      Component Value Date/Time   NA 140 05/26/2019 1046   K 3.4 (L) 05/26/2019 1046   CL 95 (L) 05/26/2019 1046   CO2 37 (H) 05/26/2019 1046   GLUCOSE 97 05/26/2019 1046   BUN 17 05/26/2019 1046   CREATININE 1.09 05/26/2019 1046   CALCIUM 9.3 05/26/2019 1046   PROT 6.9 05/26/2019 1046   ALBUMIN 4.2 05/26/2019 1046   AST 36 05/26/2019 1046   ALT 30 05/26/2019 1046   ALKPHOS 98 05/26/2019 1046   BILITOT 0.4 05/26/2019 1046       Component Value Date/Time   WBC 6.9 05/26/2019 1046   RBC 4.42 05/26/2019 1046   HGB 12.6 05/26/2019 1046   HCT 38.7 05/26/2019 1046   PLT 209.0 05/26/2019 1046   MCV 87.6 05/26/2019 1046   MCHC 32.5 05/26/2019 1046   RDW 14.6 05/26/2019 1046   LYMPHSABS 2.0 05/26/2019 1046   MONOABS 0.6 05/26/2019 1046   EOSABS 0.1 05/26/2019 1046   BASOSABS 0.0 05/26/2019 1046    No results found for: "POCLITH", "LITHIUM"   No results found for: "PHENYTOIN", "PHENOBARB", "VALPROATE", "CBMZ"   .res Assessment: Plan:    Plan:  1. Wellbutrin XL 436m daily - in the am  2. Cymbalta 644mBID 3. Trazadone 10039m 2 at hs  4. Vraylar 3mg59mily though patient assistance.   D/C Clonazepam 0.5mg 35m - not taking - no refill needed  Follow up PCP - D, B12  Continue therapy with Bambi Cottle.   RTC 6 weeks  Patient advised to contact office with any questions, adverse effects, or acute worsening in signs and symptoms.  Discussed potential metabolic side effects associated with atypical antipsychotics, as well as potential risk for movement side effects. Advised pt to contact office if movement side effects occur.   Discussed potential benefits, risk, and side effects of benzodiazepines to include potential risk of tolerance and dependence, as well as possible drowsiness.  Advised patient not to drive if experiencing drowsiness and to take lowest possible effective dose to minimize risk of dependence and tolerance.    Diagnoses and all orders for this  visit:  Major depressive disorder, recurrent episode, moderate (HCC)  Generalized anxiety disorder  PTSD (post-traumatic stress disorder)  Insomnia, unspecified type     Please see After Visit Summary for patient specific instructions.  Future Appointments  Date Time Provider [place]  2:00 PM Cottle, BambiLucious GrovesW [facility] None  02/05/2023  2:00 PM Cottle, Bambi G, LCSW [facility] None  02/26/2023  2:00 PM Cottle, Bambi G, LCSW [facility] None  09/10/2023  9:30 AM [name], Adam R, DO [facility] None    No orders of the defined types were placed in this encounter.     -------------------------------

## 2023-01-15 ENCOUNTER — Ambulatory Visit (INDEPENDENT_AMBULATORY_CARE_PROVIDER_SITE_OTHER): Payer: Medicare HMO | Admitting: Psychology

## 2023-01-15 DIAGNOSIS — F331 Major depressive disorder, recurrent, moderate: Secondary | ICD-10-CM

## 2023-01-15 DIAGNOSIS — F4310 Post-traumatic stress disorder, unspecified: Secondary | ICD-10-CM

## 2023-01-15 DIAGNOSIS — F411 Generalized anxiety disorder: Secondary | ICD-10-CM

## 2023-01-15 NOTE — Progress Notes (Signed)
[facility] Counselor/Therapist Progress Note  Patient ID: Paige [name], MRN: [id number],    Date: 01/15/2023  Time Spent: 60 minutes  Treatment Type: Individual Therapy  Reported Symptoms: depression and worry  Mental Status Exam: Appearance:  Casual     Behavior: Appropriate  Motor: Normal  Speech/Language:  Normal Rate  Affect: blunted  Mood: pleasant  Thought process: normal  Thought content:   WNL  Sensory/Perceptual disturbances:   WNL  Orientation: oriented to person, place, time/date, and situation  Attention: Good  Concentration: Good  Memory: WNL  Fund of knowledge:  Good  Insight:   Good  Judgment:  Fair  Impulse Control: Fair   Risk Assessment: Danger to Self:  No Self-injurious Behavior: No Danger to Others: No Duty to Warn:no Physical Aggression / Violence:No  Access to Firearms a concern: No  Gang Involvement:No   Subjective: The patient attended a face-to-face individual therapy session via video visit.  The patient gave verbal consent for this session to be on video on WebEx.  The patient was in her home alone and therapist was in the office alone.  The patient presents with a blunted affect and mood was pleasant.  The patient did not seem to be dissociating as much today.  The patient reports that she is now dating Paige [name].  Paige [name] is a transsexual man.  The patient reports that she is happy with this decision and she states that she is not talking to the other gentleman that she had been talking to before.  I gave her positive feedback for making a decision and it seems that right now she is happy.  I do have concerns in the long run that she may not be completely happy in this relationship.  We talked about her continuing to set limits with the ex-boyfriend so that she can see how she feels about the circumstance.  The patient reports that things seem to be going well. Interventions: Cognitive Behavioral Therapy  Diagnosis:Major  depressive disorder, recurrent episode, moderate (HCC)  Generalized anxiety disorder  PTSD (post-traumatic stress disorder)  Plan: Treatment Plan  Strengths/Abilities:  Intelligent, kind, motivated  Treatment Preferences:  Outpatient Individual therapy  Statement of Needs:  "I need some help with getting back out into the world and learn how to interact and be social"   Symptoms:  Describes a reliving of the event, particularly through dissociative flashbacks.:  (Status: improved). Displays a significant decline in interest and engagement in activities.: (Status: improved). Displays significant psychological and/or physiological  distress resulting from internal and external clues that are reminiscent of the traumatic event.:  (Status: improved). Experiences disturbances in sleep.:   (Status: improved). Experiences disturbing and persistent thoughts, images, and/or perceptions of the  traumatic event.: (Status: improved). Experiences frequent nightmares.:  (Status: improved). Has been exposed to a traumatic event involving actual or  perceived threat of death or serious injury.: (Status: maintained).  Hypervigilance (e.g., feeling constantly on edge, experiencing concentration difficulties, having trouble falling or staying asleep, exhibiting a general state of irritability).: (Status:  improved). Impairment in social, occupational, or other areas of functioning.:  (Status: improved). Intentionally avoids activities, places, people, or objects (e.g., up-armored vehicles) that evoke memories of the event.:  (Status: improved). Intentionally avoids  thoughts, feelings, or discussions related to the traumatic event.: (Status:  improved). Reports difficulty concentrating as well as feelings of guilt.:   (Status: improved). Reports response of intense fear, helplessness, or horror to the traumatic event.: (Status: improved). Symptoms present  more than one month.:  (Status: maintained).     Problems Addressed:  Anxiety, Posttraumatic Stress Disorder (PTSD),  Goals:  LTG:  1. Enhance ability to effectively cope with the full variety of life's worries  and anxieties.  70% 2. No longer avoids persons, places, activities, and objects that are  reminiscent of the traumatic event.  70% 3. No longer experiences intrusive event recollections, avoidance of event  reminders, intense arousal, or disinterest in activities or  relationships.  70% 4. Stabilize anxiety level while increasing ability to function on a daily  basis.  80% 5. Thinks about or openly discusses the traumatic event with others  without experiencing psychological or physiological distress.  80%  STG: 1.Identify and engage in pleasant activities on a daily basis..  80% 2.Identify, challenge, and replace biased, fearful self-talk with positive, realistic, and empowering self talk 3.  Participate in Cognitive Therapy to help identify, challenge, and replace biased, negative, and selfdefeating thoughts resulting from the trauma.  80 % 4.  Participate in Eye Movement Desensitization and Reprocessing (EMDR) to reduce emotional distress  related to traumatic thoughts, feelings, and images.  90%  Target Date:  02/17/2023 Frequency: Biweekly Modality:Individual Therapy Interventions by Therapist:  CBT, EMDR, insight oriented therapy, problem solving  Patient approved treatment plan and is progressing nicely.  Thersia Petraglia G Earlyn Sylvan, LCSW

## 2023-01-22 DIAGNOSIS — E559 Vitamin D deficiency, unspecified: Secondary | ICD-10-CM | POA: Diagnosis not present

## 2023-01-22 DIAGNOSIS — Z79899 Other long term (current) drug therapy: Secondary | ICD-10-CM | POA: Diagnosis not present

## 2023-01-22 DIAGNOSIS — G629 Polyneuropathy, unspecified: Secondary | ICD-10-CM | POA: Diagnosis not present

## 2023-02-05 ENCOUNTER — Ambulatory Visit (INDEPENDENT_AMBULATORY_CARE_PROVIDER_SITE_OTHER): Payer: Medicare HMO | Admitting: Psychology

## 2023-02-05 DIAGNOSIS — F331 Major depressive disorder, recurrent, moderate: Secondary | ICD-10-CM | POA: Diagnosis not present

## 2023-02-05 DIAGNOSIS — F411 Generalized anxiety disorder: Secondary | ICD-10-CM

## 2023-02-05 DIAGNOSIS — F4310 Post-traumatic stress disorder, unspecified: Secondary | ICD-10-CM | POA: Diagnosis not present

## 2023-02-06 NOTE — Progress Notes (Signed)
[facility] Counselor/Therapist Progress Note  Patient ID: Paige [name], MRN: [id number],    Date: 02/05/2023  Time Spent: 60 minutes  Treatment Type: Individual Therapy  Reported Symptoms: depression and worry  Mental Status Exam: Appearance:  Casual     Behavior: Appropriate  Motor: Normal  Speech/Language:  Normal Rate  Affect: blunted  Mood: pleasant  Thought process: normal  Thought content:   WNL  Sensory/Perceptual disturbances:   WNL  Orientation: oriented to person, place, time/date, and situation  Attention: Good  Concentration: Good  Memory: WNL  Fund of knowledge:  Good  Insight:   Good  Judgment:  Fair  Impulse Control: Fair   Risk Assessment: Danger to Self:  No Self-injurious Behavior: No Danger to Others: No Duty to Warn:no Physical Aggression / Violence:No  Access to Firearms a concern: No  Gang Involvement:No   Subjective: The patient attended a face-to-face individual therapy session via video visit.  The patient gave verbal consent for this session to be on video on WebEx.  The patient was in her home alone and therapist was in the office alone.  The patient presents with a blunted affect and mood was pleasant.  The patient reports that she and [name] are getting closer.  She talked about them possibly moving in together and eventually committing to each other.  She states that her mother did not necessarily approve of them moving in together because she was afraid it would affect her disability.  I asked the patient several questions about the relationship as I have some concerns that she may be manipulated into being involved in this circumstance.  We talked about her not being in the LBGTQ+ community prior to dating [place].  I explained that she really probably needs to think about what she is doing and if this is really what she chooses and wants to do for the long run.  I told her that probably part of her mother's concern  might be that she was heterosexual before and now she has changed her preference.  I explained that her mother probably wants her to be happy but has some concerns about this sudden change.  I explained to her that not long ago she was not interested in being in a relationship with [name] and has quickly come to say she wants to move in with [place].  The other thing I recommended was she really think about whether she is okay taking care of him as he has more health conditions than she does and financially whether this would be a good decision for her.  I just encouraged her to give some serious thought to the situation before she makes decisions that are going to impact her life significantly.  Interventions: Cognitive Behavioral Therapy  Diagnosis:Major depressive disorder, recurrent episode, moderate (HCC)  Generalized anxiety disorder  PTSD (post-traumatic stress disorder)  Plan: Treatment Plan  Strengths/Abilities:  Intelligent, kind, motivated  Treatment Preferences:  Outpatient Individual therapy  Statement of Needs:  "I need some help with getting back out into the world and learn how to interact and be social"   Symptoms:  Describes a reliving of the event, particularly through dissociative flashbacks.:  (Status: improved). Displays a significant decline in interest and engagement in activities.: (Status: improved). Displays significant psychological and/or physiological  distress resulting from internal and external clues that are reminiscent of the traumatic event.:  (Status: improved). Experiences disturbances in sleep.:   (Status: improved). Experiences disturbing and persistent thoughts, images,  and/or perceptions of the  traumatic event.: (Status: improved). Experiences frequent nightmares.:  (Status: improved). Has been exposed to a traumatic event involving actual or  perceived threat of death or serious injury.: (Status: maintained).  Hypervigilance (e.g., feeling  constantly on edge, experiencing concentration difficulties, having trouble falling or staying asleep, exhibiting a general state of irritability).: (Status:  improved). Impairment in social, occupational, or other areas of functioning.:  (Status: improved). Intentionally avoids activities, places, people, or objects (e.g., up-armored vehicles) that evoke memories of the event.:  (Status: improved). Intentionally avoids  thoughts, feelings, or discussions related to the traumatic event.: (Status:  improved). Reports difficulty concentrating as well as feelings of guilt.:   (Status: improved). Reports response of intense fear, helplessness, or horror to the traumatic event.: (Status: improved). Symptoms present more than one month.:  (Status: maintained).    Problems Addressed:  Anxiety, Posttraumatic Stress Disorder (PTSD),  Goals:  LTG:  1. Enhance ability to effectively cope with the full variety of life's worries  and anxieties.  70% 2. No longer avoids persons, places, activities, and objects that are  reminiscent of the traumatic event.  70% 3. No longer experiences intrusive event recollections, avoidance of event  reminders, intense arousal, or disinterest in activities or  relationships.  70% 4. Stabilize anxiety level while increasing ability to function on a daily  basis.  80% 5. Thinks about or openly discusses the traumatic event with others  without experiencing psychological or physiological distress.  80%  STG: 1.Identify and engage in pleasant activities on a daily basis..  80% 2.Identify, challenge, and replace biased, fearful self-talk with positive, realistic, and empowering self talk 3.  Participate in Cognitive Therapy to help identify, challenge, and replace biased, negative, and selfdefeating thoughts resulting from the trauma.  80 % 4.  Participate in Eye Movement Desensitization and Reprocessing (EMDR) to reduce emotional distress  related to traumatic thoughts,  feelings, and images.  90%  Target Date:  02/17/2024 Frequency: Biweekly Modality:Individual Therapy Interventions by Therapist:  CBT, EMDR, insight oriented therapy, problem solving  Patient approved treatment plan and is progressing nicely.  Tysin Salada G Lucile Didonato, LCSW

## 2023-02-13 DIAGNOSIS — E039 Hypothyroidism, unspecified: Secondary | ICD-10-CM | POA: Diagnosis not present

## 2023-02-13 DIAGNOSIS — G4733 Obstructive sleep apnea (adult) (pediatric): Secondary | ICD-10-CM | POA: Diagnosis not present

## 2023-02-13 DIAGNOSIS — K5909 Other constipation: Secondary | ICD-10-CM | POA: Diagnosis not present

## 2023-02-13 DIAGNOSIS — E162 Hypoglycemia, unspecified: Secondary | ICD-10-CM | POA: Diagnosis not present

## 2023-02-13 DIAGNOSIS — G2581 Restless legs syndrome: Secondary | ICD-10-CM | POA: Diagnosis not present

## 2023-02-13 DIAGNOSIS — Z6836 Body mass index (BMI) 36.0-36.9, adult: Secondary | ICD-10-CM | POA: Diagnosis not present

## 2023-02-18 DIAGNOSIS — Z79899 Other long term (current) drug therapy: Secondary | ICD-10-CM | POA: Diagnosis not present

## 2023-02-18 DIAGNOSIS — E162 Hypoglycemia, unspecified: Secondary | ICD-10-CM | POA: Diagnosis not present

## 2023-02-18 DIAGNOSIS — G2581 Restless legs syndrome: Secondary | ICD-10-CM | POA: Diagnosis not present

## 2023-02-18 DIAGNOSIS — G629 Polyneuropathy, unspecified: Secondary | ICD-10-CM | POA: Diagnosis not present

## 2023-02-19 DIAGNOSIS — G2581 Restless legs syndrome: Secondary | ICD-10-CM | POA: Diagnosis not present

## 2023-02-19 DIAGNOSIS — E162 Hypoglycemia, unspecified: Secondary | ICD-10-CM | POA: Diagnosis not present

## 2023-02-19 DIAGNOSIS — Z79899 Other long term (current) drug therapy: Secondary | ICD-10-CM | POA: Diagnosis not present

## 2023-02-21 ENCOUNTER — Telehealth (INDEPENDENT_AMBULATORY_CARE_PROVIDER_SITE_OTHER): Payer: Medicare HMO | Admitting: Adult Health

## 2023-02-21 ENCOUNTER — Encounter: Payer: Self-pay | Admitting: Adult Health

## 2023-02-21 DIAGNOSIS — G4700 Insomnia, unspecified: Secondary | ICD-10-CM | POA: Diagnosis not present

## 2023-02-21 DIAGNOSIS — F4310 Post-traumatic stress disorder, unspecified: Secondary | ICD-10-CM

## 2023-02-21 DIAGNOSIS — F331 Major depressive disorder, recurrent, moderate: Secondary | ICD-10-CM

## 2023-02-21 DIAGNOSIS — F411 Generalized anxiety disorder: Secondary | ICD-10-CM

## 2023-02-21 DIAGNOSIS — Z1231 Encounter for screening mammogram for malignant neoplasm of breast: Secondary | ICD-10-CM | POA: Diagnosis not present

## 2023-02-21 NOTE — Progress Notes (Signed)
Paige [name] [id number] 15-Jan-1966 57 y.o.  Subjective:   Patient ID:  Paige [name] is a 57 y.o. (DOB 08/19/1966) female.  Chief Complaint: No chief complaint on file.   HPI Paige [name] presents to the office today for follow-up of GAD, MDD, PTSD and insomnia.  Describes mood today as "so-so". Pleasant. Reports tearfulness. Mood symptoms - reports depression - "a lot - not feeling myself lately - not sure why". Reports anxiety and irritability. Reports decreased worry, rumination and over thinking. Mood is lower. Stating "I just don't feel right". Reports taking medications as prescribed. Noting some recent health concerns - difficulties managing blood sugars and is not sleeping. Continues to see therapist Chiropractor). Making medications as prescribed.  Energy levels lower. Active, walking most days. Enjoys some usual interests and activities. Single. Lives alone with cat - "Haze Boyden" - and 2 outdoor cats - "Enid Derry and [employer]". Mother local and supportive. Attends church. Appetite adequate - "watching what I eat". Weight gain - 230 to 240 pounds. Sleeps better some nights than others. Has a home sleep study scheduled. Denies daytime napping. Focus and concentration difficulties. Reports being forgetful. Getting some things done around the house. Disabled since 2009. Denies SI or HI.  Denies AH or VH. Denies self harm. Denies substance use.  Previous medication trials: Lexapro, Paxil, Trazadone, Wellbutrin SR, Geodon, Seroquel, Risperdal, Depakote, Topamax, Lithium, Provigil, Ambien, Cytomel, Clonazepam and other benzodixepaines, Elavil, Effexor, Remeron, Prozac, Anafranil, Tofranil, Zoloft, Geodon, Buspar, Adderall, Ritalin, Abilify, Rexulti, Lamictal, Latuda    Mini-Mental    Flowsheet Row Office Visit from 04/07/2018 in [facility]  Total Score (max 30 points ) 29      PHQ2-9    [place] [facility] Out[facility] from 05/22/2018 in [place] [facility] from 05/13/2018 in [place] [facility] Telephone from 04/30/2018 in [place]  PHQ-2 Total Score 5 5 2   PHQ-9 Total Score 20 18 13         Review of Systems:  Review of Systems  Musculoskeletal:  Negative for gait problem.  Neurological:  Negative for tremors.  Psychiatric/Behavioral:         Please refer to HPI    Medications: I have reviewed the patient's current medications.  Current Outpatient Medications  Medication Sig Dispense Refill   buPROPion (WELLBUTRIN XL) 150 MG 24 hr tablet TAKE 3 TABLETS EVERY MORNING 270 tablet 3   cariprazine (VRAYLAR) 3 MG capsule Take 1 capsule (3 mg total) by mouth daily. 90 capsule 3   cetirizine (ZYRTEC) 10 MG tablet Take by mouth.     clonazePAM (KLONOPIN) 0.5 MG tablet Take 1 tablet (0.5 mg total) by mouth 2 (two) times daily as needed for anxiety. 30 tablet 2   dicyclomine (BENTYL) 10 MG capsule Take 1 capsule (10 mg total) by mouth 3 (three) times daily as needed for spasms. 90 capsule 11   DULoxetine (CYMBALTA) 60 MG capsule Take 1 capsule (60 mg total) by mouth 2 (two) times daily. 180 capsule 3   Erenumab-aooe (AIMOVIG) 70 MG/ML SOAJ INJECT 70 MG INTO THE SKIN EVERY 28 DAYS. 1 mL 11   Fe Fum-Fe Poly-Vit C-Lactobac (FUSION) 65-65-25-30 MG CAPS Take by mouth.     ipratropium (ATROVENT) 0.06 % nasal spray      levothyroxine (SYNTHROID, LEVOTHROID) 88 MCG tablet Take 88 mcg by mouth daily before breakfast.     liothyronine (CYTOMEL) 5 MCG tablet Take 5 mcg by mouth daily. Monday Wednesday Friday  nystatin cream (MYCOSTATIN) Apply 1 application  topically daily as needed for dry skin.     omeprazole (PRILOSEC) 20 MG capsule      ondansetron (ZOFRAN-ODT) 4 MG disintegrating tablet Take 1 tablet (4 mg total) by mouth every 8 (eight) hours as needed for nausea or vomiting. 20 tablet 6   Oxycodone HCl 10 MG TABS Take 10 mg by mouth every 6 (six) hours as needed.      Polyethyl Glycol-Propyl Glycol 0.4-0.3 % SOLN Apply 1 drop to eye as needed.     polyethylene glycol (MIRALAX / GLYCOLAX) 17 g packet Take 17 g by mouth 2 (two) times daily.     potassium chloride (K-DUR,KLOR-CON) 10 MEQ tablet Take 1 tablet by mouth daily.     progesterone (PROMETRIUM) 200 MG capsule Take 200 mg by mouth at bedtime.     promethazine (PHENERGAN) 12.5 MG tablet Take 1 tablet (12.5 mg total) by mouth every 8 (eight) hours as needed for nausea or vomiting. 30 tablet 0   rOPINIRole (REQUIP) 2 MG tablet Take 2 mg by mouth 2 (two) times daily.     terconazole (TERAZOL 3) 0.8 % vaginal cream Place 1 applicator vaginally at bedtime.     tiZANidine (ZANAFLEX) 2 MG tablet Take 2 mg by mouth 3 (three) times daily.     torsemide (DEMADEX) 20 MG tablet Take 20 mg by mouth daily.      traZODone (DESYREL) 100 MG tablet Take 2 tablets (200 mg total) by mouth at bedtime. 180 tablet 3   Ubrogepant (UBRELVY) 100 MG TABS Take 1 tablet by mouth daily as needed (at onset of headaches may repeat dose after 2 hours as needed max 200 mg in 24 hours). 16 tablet 11   Current Facility-Administered Medications  Medication Dose Route Frequency Provider Last Rate Last Admin   0.9 %  sodium chloride infusion  500 mL Intravenous Once Ladene Artist, MD        Medication Side Effects: None  Allergies:  Allergies  Allergen Reactions   Cyclobenzaprine Other (See Comments)    Other reaction(s): Unknown Hallucinations Unknown    Meloxicam Other (See Comments)   Naltrexone Other (See Comments)    SEVERE PAIN ALL OVER   Pork (Porcine) Protein Other (See Comments)    Other reaction(s): Other (See Comments) Migraines Other reaction(s): Other (See Comments) Migraines   Sumatriptan Other (See Comments)    Difficulty breathing Other reaction(s): Other (see comments) Off balance    Tape Dermatitis   Flexeril [Cyclobenzaprine Hcl]    Imitrex [Sumatriptan Base]    Other    Sulfa Antibiotics     Pt  states she does not think she is allergic to Sulfa     Past Medical History:  Diagnosis Date   Allergic rhinitis    Allergy    Anemia    Anxiety disorder    Cancer (HCC)    skin   Chronic fatigue    Chronic headaches    Chronic pain    Depression    Endometriosis    Family history of colon cancer    mother,uncles,aunts   Fibromyalgia    Gallstones    GERD (gastroesophageal reflux disease)    Hypothyroidism    IBS (irritable bowel syndrome)    Insomnia    Iron deficiency    Laryngopharyngeal reflux (LPR)    Obesity    Personal history of colonic polyps 04/2006   polypoid   PONV (postoperative nausea and vomiting)  RLS (restless legs syndrome)    Sleep apnea    no cpap    Past Medical History, Surgical history, Social history, and Family history were reviewed and updated as appropriate.   Please see review of systems for further details on the patient's review from today.   Objective:   Physical Exam:  There were no vitals taken for this visit.  Physical Exam Constitutional:      General: She is not in acute distress. Musculoskeletal:        General: No deformity.  Neurological:     Mental Status: She is alert and oriented to person, place, and time.     Coordination: Coordination normal.  Psychiatric:        Attention and Perception: Attention and perception normal. She does not perceive auditory or visual hallucinations.        Mood and Affect: Mood normal. Mood is not anxious or depressed. Affect is not labile, blunt, angry or inappropriate.        Speech: Speech normal.        Behavior: Behavior normal.        Thought Content: Thought content normal. Thought content is not paranoid or delusional. Thought content does not include homicidal or suicidal ideation. Thought content does not include homicidal or suicidal plan.        Cognition and Memory: Cognition and memory normal.        Judgment: Judgment normal.     Comments: Insight intact     Lab  Review:     Component Value Date/Time   NA 140 05/26/2019 1046   K 3.4 (L) 05/26/2019 1046   CL 95 (L) 05/26/2019 1046   CO2 37 (H) 05/26/2019 1046   GLUCOSE 97 05/26/2019 1046   BUN 17 05/26/2019 1046   CREATININE 1.09 05/26/2019 1046   CALCIUM 9.3 05/26/2019 1046   PROT 6.9 05/26/2019 1046   ALBUMIN 4.2 05/26/2019 1046   AST 36 05/26/2019 1046   ALT 30 05/26/2019 1046   ALKPHOS 98 05/26/2019 1046   BILITOT 0.4 05/26/2019 1046       Component Value Date/Time   WBC 6.9 05/26/2019 1046   RBC 4.42 05/26/2019 1046   HGB 12.6 05/26/2019 1046   HCT 38.7 05/26/2019 1046   PLT 209.0 05/26/2019 1046   MCV 87.6 05/26/2019 1046   MCHC 32.5 05/26/2019 1046   RDW 14.6 05/26/2019 1046   LYMPHSABS 2.0 05/26/2019 1046   MONOABS 0.6 05/26/2019 1046   EOSABS 0.1 05/26/2019 1046   BASOSABS 0.0 05/26/2019 1046    No results found for: "POCLITH", "LITHIUM"   No results found for: "PHENYTOIN", "PHENOBARB", "VALPROATE", "CBMZ"   .res Assessment: Plan:    Plan:  1. Wellbutrin XL 450mg  daily - in the am  2. Cymbalta 60mg  BID 3. Trazadone 100mg  - 2 at hs  4. Vraylar 3mg  daily though patient assistance.   D/C Clonazepam 0.5mg  BID - not taking - no refill needed  Continue therapy with Bambi Cottle.   RTC 6 weeks  Patient advised to contact office with any questions, adverse effects, or acute worsening in signs and symptoms.  Discussed potential metabolic side effects associated with atypical antipsychotics, as well as potential risk for movement side effects. Advised pt to contact office if movement side effects occur.   Discussed potential benefits, risk, and side effects of benzodiazepines to include potential risk of tolerance and dependence, as well as possible drowsiness.  Advised patient not to drive if experiencing drowsiness and  to take lowest possible effective dose to minimize risk of dependence and tolerance.    There are no diagnoses linked to this encounter.   Please  see After Visit Summary for patient specific instructions.  Future Appointments  Date Time Provider [place]  02/26/2023  2:00 PM Cottle, Lucious [place], Julian [facility] None  03/19/2023  2:00 PM Cottle, Bambi G, LCSW [facility] None  04/09/2023  2:00 PM Cottle, Bambi G, LCSW [facility] None  04/30/2023  2:00 PM Cottle, Bambi G, LCSW [facility] None  09/10/2023  9:30 AM [name], Adam R, DO [facility] None    No orders of the defined types were placed in this encounter.   -------------------------------

## 2023-02-25 DIAGNOSIS — M25561 Pain in right knee: Secondary | ICD-10-CM | POA: Diagnosis not present

## 2023-02-25 DIAGNOSIS — M25551 Pain in right hip: Secondary | ICD-10-CM | POA: Diagnosis not present

## 2023-02-26 ENCOUNTER — Ambulatory Visit (INDEPENDENT_AMBULATORY_CARE_PROVIDER_SITE_OTHER): Payer: Medicare HMO | Admitting: Psychology

## 2023-02-26 DIAGNOSIS — F4310 Post-traumatic stress disorder, unspecified: Secondary | ICD-10-CM

## 2023-02-26 DIAGNOSIS — F411 Generalized anxiety disorder: Secondary | ICD-10-CM

## 2023-02-26 DIAGNOSIS — F3341 Major depressive disorder, recurrent, in partial remission: Secondary | ICD-10-CM

## 2023-02-26 NOTE — Progress Notes (Signed)
[place] Counselor/Therapist Progress Note  Patient ID: Paige [name], MRN: [id number],    Date: 02/26/2023  Time Spent: 50 minutes  Treatment Type: Individual Therapy  Reported Symptoms: depression and worry  Mental Status Exam: Appearance:  Casual     Behavior: Appropriate  Motor: Normal  Speech/Language:  Normal Rate  Affect: blunted  Mood: pleasant  Thought process: normal  Thought content:   WNL  Sensory/Perceptual disturbances:   WNL  Orientation: oriented to person, place, time/date, and situation  Attention: Good  Concentration: Good  Memory: WNL  Fund of knowledge:  Good  Insight:   Good  Judgment:  Fair  Impulse Control: Fair   Risk Assessment: Danger to Self:  No Self-injurious Behavior: No Danger to Others: No Duty to Warn:no Physical Aggression / Violence:No  Access to Firearms a concern: No  Gang Involvement:No   Subjective: The patient attended a face-to-face individual therapy session via video visit.  The patient gave verbal consent for this session to be on video on WebEx.  The patient was in her home alone and therapist was in the office alone.  The patient presents with a blunted affect and mood was pleasant.  The patient reports that she and Paige [name] are continuing their relationship and she feels like it is going well.  She reports that he continues to do nice things for her and they are talking about moving in together probably in about a year.  I gave her positive feedback on giving it some time so that she can know for sure that that is what she wants prior to making that big decision.  Him moving and would probably make a difference in his financial picture but not so much hers.  She also has to consider how her mother feels about that since her mother bought the house.  The patient seems to be doing relatively well but is having some health issues right now.  Provided supportive therapy. Interventions: Cognitive Behavioral  Therapy  Diagnosis:PTSD (post-traumatic stress disorder)  Generalized anxiety disorder  Recurrent major depressive disorder, in partial remission (HCC)  Plan: Treatment Plan  Strengths/Abilities:  Intelligent, kind, motivated  Treatment Preferences:  Outpatient Individual therapy  Statement of Needs:  "I need some help with getting back out into the world and learn how to interact and be social"   Symptoms:  Describes a reliving of the event, particularly through dissociative flashbacks.:  (Status: improved). Displays a significant decline in interest and engagement in activities.: (Status: improved). Displays significant psychological and/or physiological  distress resulting from internal and external clues that are reminiscent of the traumatic event.:  (Status: improved). Experiences disturbances in sleep.:   (Status: improved). Experiences disturbing and persistent thoughts, images, and/or perceptions of the  traumatic event.: (Status: improved). Experiences frequent nightmares.:  (Status: improved). Has been exposed to a traumatic event involving actual or  perceived threat of death or serious injury.: (Status: maintained).  Hypervigilance (e.Paige., feeling constantly on edge, experiencing concentration difficulties, having trouble falling or staying asleep, exhibiting a general state of irritability).: (Status:  improved). Impairment in social, occupational, or other areas of functioning.:  (Status: improved). Intentionally avoids activities, places, people, or objects (e.Paige., up-armored vehicles) that evoke memories of the event.:  (Status: improved). Intentionally avoids  thoughts, feelings, or discussions related to the traumatic event.: (Status:  improved). Reports difficulty concentrating as well as feelings of guilt.:   (Status: improved). Reports response of intense fear, helplessness, or horror to the traumatic event.: (Status: improved). Symptoms  present more than one month.:   (Status: maintained).    Problems Addressed:  Anxiety, Posttraumatic Stress Disorder (PTSD),  Goals:  LTG:  1. Enhance ability to effectively cope with the full variety of life's worries  and anxieties.  70% 2. No longer avoids persons, places, activities, and objects that are  reminiscent of the traumatic event.  70% 3. No longer experiences intrusive event recollections, avoidance of event  reminders, intense arousal, or disinterest in activities or  relationships.  70% 4. Stabilize anxiety level while increasing ability to function on a daily  basis.  80% 5. Thinks about or openly discusses the traumatic event with others  without experiencing psychological or physiological distress.  80%  STG: 1.Identify and engage in pleasant activities on a daily basis..  80% 2.Identify, challenge, and replace biased, fearful self-talk with positive, realistic, and empowering self talk 3.  Participate in Cognitive Therapy to help identify, challenge, and replace biased, negative, and selfdefeating thoughts resulting from the trauma.  80 % 4.  Participate in Eye Movement Desensitization and Reprocessing (EMDR) to reduce emotional distress  related to traumatic thoughts, feelings, and images.  90%  Target Date:  02/17/2024 Frequency: Biweekly Modality:Individual Therapy Interventions by Therapist:  CBT, EMDR, insight oriented therapy, problem solving  Patient approved treatment plan and is progressing nicely.  Paige [name] Paige Nycole Kawahara, LCSW

## 2023-02-27 DIAGNOSIS — G4733 Obstructive sleep apnea (adult) (pediatric): Secondary | ICD-10-CM | POA: Diagnosis not present

## 2023-03-06 ENCOUNTER — Other Ambulatory Visit: Payer: Self-pay | Admitting: Adult Health

## 2023-03-06 DIAGNOSIS — F411 Generalized anxiety disorder: Secondary | ICD-10-CM

## 2023-03-07 ENCOUNTER — Telehealth: Payer: Self-pay | Admitting: Adult Health

## 2023-03-07 ENCOUNTER — Other Ambulatory Visit: Payer: Self-pay

## 2023-03-07 DIAGNOSIS — F411 Generalized anxiety disorder: Secondary | ICD-10-CM

## 2023-03-07 MED ORDER — CLONAZEPAM 0.5 MG PO TABS: 0.5000 mg | ORAL_TABLET | Freq: Two times a day (BID) | ORAL | 2 refills | Status: DC | PRN

## 2023-03-07 NOTE — Telephone Encounter (Signed)
Pended.

## 2023-03-07 NOTE — Telephone Encounter (Signed)
Can you call patient to discuss and see what she needs. She's fine to continue the Clonazepam.

## 2023-03-07 NOTE — Telephone Encounter (Signed)
Pharmacy called and said that the patient called them and said that she was told by regina that she could take the klonopin  at night to help her sleep.  So please send script or call and explain to the patient why  script was denied

## 2023-03-11 DIAGNOSIS — R5383 Other fatigue: Secondary | ICD-10-CM | POA: Diagnosis not present

## 2023-03-11 DIAGNOSIS — N951 Menopausal and female climacteric states: Secondary | ICD-10-CM | POA: Diagnosis not present

## 2023-03-11 DIAGNOSIS — M25561 Pain in right knee: Secondary | ICD-10-CM | POA: Diagnosis not present

## 2023-03-11 DIAGNOSIS — M7061 Trochanteric bursitis, right hip: Secondary | ICD-10-CM | POA: Diagnosis not present

## 2023-03-12 DIAGNOSIS — M25561 Pain in right knee: Secondary | ICD-10-CM | POA: Diagnosis not present

## 2023-03-12 DIAGNOSIS — M25551 Pain in right hip: Secondary | ICD-10-CM | POA: Diagnosis not present

## 2023-03-13 DIAGNOSIS — R232 Flushing: Secondary | ICD-10-CM | POA: Diagnosis not present

## 2023-03-13 DIAGNOSIS — R5383 Other fatigue: Secondary | ICD-10-CM | POA: Diagnosis not present

## 2023-03-13 DIAGNOSIS — N951 Menopausal and female climacteric states: Secondary | ICD-10-CM | POA: Diagnosis not present

## 2023-03-13 DIAGNOSIS — Z79890 Hormone replacement therapy (postmenopausal): Secondary | ICD-10-CM | POA: Diagnosis not present

## 2023-03-13 DIAGNOSIS — R6882 Decreased libido: Secondary | ICD-10-CM | POA: Diagnosis not present

## 2023-03-19 ENCOUNTER — Ambulatory Visit: Payer: Medicare HMO | Admitting: Psychology

## 2023-03-27 DIAGNOSIS — M25561 Pain in right knee: Secondary | ICD-10-CM | POA: Diagnosis not present

## 2023-03-27 DIAGNOSIS — M25551 Pain in right hip: Secondary | ICD-10-CM | POA: Diagnosis not present

## 2023-03-31 DIAGNOSIS — M25551 Pain in right hip: Secondary | ICD-10-CM | POA: Diagnosis not present

## 2023-03-31 DIAGNOSIS — M25561 Pain in right knee: Secondary | ICD-10-CM | POA: Diagnosis not present

## 2023-04-02 ENCOUNTER — Telehealth (INDEPENDENT_AMBULATORY_CARE_PROVIDER_SITE_OTHER): Payer: Medicare HMO | Admitting: Adult Health

## 2023-04-02 ENCOUNTER — Encounter: Payer: Self-pay | Admitting: Adult Health

## 2023-04-02 DIAGNOSIS — F3341 Major depressive disorder, recurrent, in partial remission: Secondary | ICD-10-CM | POA: Diagnosis not present

## 2023-04-02 DIAGNOSIS — F4310 Post-traumatic stress disorder, unspecified: Secondary | ICD-10-CM | POA: Diagnosis not present

## 2023-04-02 DIAGNOSIS — G4700 Insomnia, unspecified: Secondary | ICD-10-CM

## 2023-04-02 DIAGNOSIS — F411 Generalized anxiety disorder: Secondary | ICD-10-CM

## 2023-04-02 NOTE — Progress Notes (Signed)
Paige [name] [id number] [birth year] 57 y.o.  Virtual Visit via Video Note  I connected with pt @ on 04/02/23 at  8:00 AM EDT by a video enabled telemedicine application and verified that I am speaking with the correct person using two identifiers.   I discussed the limitations of evaluation and management by telemedicine and the availability of in person appointments. The patient expressed understanding and agreed to proceed.  I discussed the assessment and treatment plan with the patient. The patient was provided an opportunity to ask questions and all were answered. The patient agreed with the plan and demonstrated an understanding of the instructions.   The patient was advised to call back or seek an in-person evaluation if the symptoms worsen or if the condition fails to improve as anticipated.  I provided 25 minutes of non-face-to-face time during this encounter.  The patient was located at home.  The provider was located at [facility].   Paige Gibbs, NP   Subjective:   Patient ID:  Paige [name] is a 57 y.o. (DOB October 14, 1966) female.  Chief Complaint: No chief complaint on file.   HPI Paige [name] presents for follow-up of GAD, MDD, PTSD and insomnia.  Describes mood today as "ok". Pleasant. Reports tearfulness. Mood symptoms - reports decreased depression.   Reports anxiety and irritability - "a little bit". Reports decreased worry, rumination and over thinking. Mood has improved a little bit. Stating "I feel a little better than I did". Feels like medications are helpful. Continues to see therapist Optician, dispensing).  Energy levels lower. Active, walking most days. Enjoys some usual interests and activities. Single. Lives alone with cat - "Paige [name]" - and 3 outdoor cats - "Paige [name]". Mother local and supportive. Attends church. Appetite adequate. Weight fluctuates - 230 to 240 pounds. Sleeps better some nights than others. Averages  6 hours. Recently diagnosed with sleep apnea. Denies daytime napping. Focus and concentration "fair". Getting some things done around the house. Disabled since 2009. Denies SI or HI.  Denies AH or VH. Denies self harm. Denies substance use.  Working with P/T.  Previous medication trials: Lexapro, Paxil, Trazadone, Wellbutrin SR, Geodon, Seroquel, Risperdal, Depakote, Topamax, Lithium, Provigil, Ambien, Cytomel, Clonazepam and other benzodixepaines, Elavil, Effexor, Remeron, Prozac, Anafranil, Tofranil, Zoloft, Geodon, Buspar, Adderall, Ritalin, Abilify, Rexulti, Lamictal, Latuda    Review of Systems:  Review of Systems  Musculoskeletal:  Negative for gait problem.  Neurological:  Negative for tremors.  Psychiatric/Behavioral:         Please refer to HPI    Medications: I have reviewed the patient's current medications.  Current Outpatient Medications  Medication Sig Dispense Refill   buPROPion (WELLBUTRIN XL) 150 MG 24 hr tablet TAKE 3 TABLETS EVERY MORNING 270 tablet 3   cariprazine (VRAYLAR) 3 MG capsule Take 1 capsule (3 mg total) by mouth daily. 90 capsule 3   cetirizine (ZYRTEC) 10 MG tablet Take by mouth.     clonazePAM (KLONOPIN) 0.5 MG tablet Take 1 tablet (0.5 mg total) by mouth 2 (two) times daily as needed for anxiety. 30 tablet 2   dicyclomine (BENTYL) 10 MG capsule Take 1 capsule (10 mg total) by mouth 3 (three) times daily as needed for spasms. 90 capsule 11   DULoxetine (CYMBALTA) 60 MG capsule Take 1 capsule (60 mg total) by mouth 2 (two) times daily. 180 capsule 3   Erenumab-aooe (AIMOVIG) 70 MG/ML SOAJ INJECT 70 MG INTO THE SKIN EVERY 28 DAYS. 1 mL 11  Fe Fum-Fe Poly-Vit C-Lactobac (FUSION) 65-65-25-30 MG CAPS Take by mouth.     ipratropium (ATROVENT) 0.06 % nasal spray      levothyroxine (SYNTHROID, LEVOTHROID) 88 MCG tablet Take 88 mcg by mouth daily before breakfast.     liothyronine (CYTOMEL) 5 MCG tablet Take 5 mcg by mouth daily. Monday Wednesday Friday      nystatin cream (MYCOSTATIN) Apply 1 application  topically daily as needed for dry skin.     omeprazole (PRILOSEC) 20 MG capsule      ondansetron (ZOFRAN-ODT) 4 MG disintegrating tablet Take 1 tablet (4 mg total) by mouth every 8 (eight) hours as needed for nausea or vomiting. 20 tablet 6   Oxycodone HCl 10 MG TABS Take 10 mg by mouth every 6 (six) hours as needed.     Polyethyl Glycol-Propyl Glycol 0.4-0.3 % SOLN Apply 1 drop to eye as needed.     polyethylene glycol (MIRALAX / GLYCOLAX) 17 g packet Take 17 g by mouth 2 (two) times daily.     potassium chloride (K-DUR,KLOR-CON) 10 MEQ tablet Take 1 tablet by mouth daily.     progesterone (PROMETRIUM) 200 MG capsule Take 200 mg by mouth at bedtime.     promethazine (PHENERGAN) 12.5 MG tablet Take 1 tablet (12.5 mg total) by mouth every 8 (eight) hours as needed for nausea or vomiting. 30 tablet 0   rOPINIRole (REQUIP) 2 MG tablet Take 2 mg by mouth 2 (two) times daily.     terconazole (TERAZOL 3) 0.8 % vaginal cream Place 1 applicator vaginally at bedtime.     tiZANidine (ZANAFLEX) 2 MG tablet Take 2 mg by mouth 3 (three) times daily.     torsemide (DEMADEX) 20 MG tablet Take 20 mg by mouth daily.      traZODone (DESYREL) 100 MG tablet Take 2 tablets (200 mg total) by mouth at bedtime. 180 tablet 3   Ubrogepant (UBRELVY) 100 MG TABS Take 1 tablet by mouth daily as needed (at onset of headaches may repeat dose after 2 hours as needed max 200 mg in 24 hours). 16 tablet 11   Current Facility-Administered Medications  Medication Dose Route Frequency Provider Last Rate Last Admin   0.9 %  sodium chloride infusion  500 mL Intravenous Once Meryl Dare, MD        Medication Side Effects: None  Allergies:  Allergies  Allergen Reactions   Cyclobenzaprine Other (See Comments)    Other reaction(s): Unknown Hallucinations Unknown    Meloxicam Other (See Comments)   Naltrexone Other (See Comments)    SEVERE PAIN ALL OVER   Pork (Porcine)  Protein Other (See Comments)    Other reaction(s): Other (See Comments) Migraines Other reaction(s): Other (See Comments) Migraines   Sumatriptan Other (See Comments)    Difficulty breathing Other reaction(s): Other (see comments) Off balance    Tape Dermatitis   Flexeril [Cyclobenzaprine Hcl]    Imitrex [Sumatriptan Base]    Other    Sulfa Antibiotics     Pt states she does not think she is allergic to Sulfa     Past Medical History:  Diagnosis Date   Allergic rhinitis    Allergy    Anemia    Anxiety disorder    Cancer (HCC)    skin   Chronic fatigue    Chronic headaches    Chronic pain    Depression    Endometriosis    Family history of colon cancer    mother,uncles,aunts   Fibromyalgia  Gallstones    GERD (gastroesophageal reflux disease)    Hypothyroidism    IBS (irritable bowel syndrome)    Insomnia    Iron deficiency    Laryngopharyngeal reflux (LPR)    Obesity    Personal history of colonic polyps 04/2006   polypoid   PONV (postoperative nausea and vomiting)    RLS (restless legs syndrome)    Sleep apnea    no cpap    Family History  Problem Relation Age of Onset   Colon cancer Mother        uncles,aunts   Diabetes Father        paternal grandmother,sister   Leukemia Father    Lymphoma Father    Skin cancer Father    Heart disease Father    Colon polyps Sister    Diabetes Sister    Colon cancer Maternal Aunt    Colon cancer Maternal Uncle    Colon polyps Cousin    Stomach cancer Neg Hx    Esophageal cancer Neg Hx    Rectal cancer Neg Hx     Social History   Socioeconomic History   Marital status: Single    Spouse name: Not on file   Number of children: 0   Years of education: Not on file   Highest education level: Not on file  Occupational History   Occupation: RN/disabled  Tobacco Use   Smoking status: Never   Smokeless tobacco: Never  Vaping Use   Vaping Use: Never used  Substance and Sexual Activity   Alcohol use: Not  Currently    Alcohol/week: 0.0 standard drinks of alcohol    Comment: rarely   Drug use: No   Sexual activity: Not on file  Other Topics Concern   Not on file  Social History Narrative   Patient is left-handed. She lives alone in a one level home. She has 3 cats. She is unable to exercise. Caffeine one soda / day. On disability now. 2 bachelor degrees   Social Determinants of Health   Financial Resource Strain: Not on file  Food Insecurity: Not on file  Transportation Needs: Not on file  Physical Activity: Not on file  Stress: Not on file  Social Connections: Not on file  Intimate Partner Violence: Not on file    Past Medical History, Surgical history, Social history, and Family history were reviewed and updated as appropriate.   Please see review of systems for further details on the patient's review from today.   Objective:   Physical Exam:  There were no vitals taken for this visit.  Physical Exam Constitutional:      General: She is not in acute distress. Musculoskeletal:        General: No deformity.  Neurological:     Mental Status: She is alert and oriented to person, place, and time.     Coordination: Coordination normal.  Psychiatric:        Attention and Perception: Attention and perception normal. She does not perceive auditory or visual hallucinations.        Mood and Affect: Mood normal. Mood is not anxious or depressed. Affect is not labile, blunt, angry or inappropriate.        Speech: Speech normal.        Behavior: Behavior normal.        Thought Content: Thought content normal. Thought content is not paranoid or delusional. Thought content does not include homicidal or suicidal ideation. Thought content does not include homicidal or  suicidal plan.        Cognition and Memory: Cognition and memory normal.        Judgment: Judgment normal.     Comments: Insight intact     Lab Review:     Component Value Date/Time   NA 140 05/26/2019 1046   K 3.4  (L) 05/26/2019 1046   CL 95 (L) 05/26/2019 1046   CO2 37 (H) 05/26/2019 1046   GLUCOSE 97 05/26/2019 1046   BUN 17 05/26/2019 1046   CREATININE 1.09 05/26/2019 1046   CALCIUM 9.3 05/26/2019 1046   PROT 6.9 05/26/2019 1046   ALBUMIN 4.2 05/26/2019 1046   AST 36 05/26/2019 1046   ALT 30 05/26/2019 1046   ALKPHOS 98 05/26/2019 1046   BILITOT 0.4 05/26/2019 1046       Component Value Date/Time   WBC 6.9 05/26/2019 1046   RBC 4.42 05/26/2019 1046   HGB 12.6 05/26/2019 1046   HCT 38.7 05/26/2019 1046   PLT 209.0 05/26/2019 1046   MCV 87.6 05/26/2019 1046   MCHC 32.5 05/26/2019 1046   RDW 14.6 05/26/2019 1046   LYMPHSABS 2.0 05/26/2019 1046   MONOABS 0.6 05/26/2019 1046   EOSABS 0.1 05/26/2019 1046   BASOSABS 0.0 05/26/2019 1046    No results found for: "POCLITH", "LITHIUM"   No results found for: "PHENYTOIN", "PHENOBARB", "VALPROATE", "CBMZ"   .res Assessment: Plan:    Plan:  1. Wellbutrin XL 450mg  daily - in the am  2. Cymbalta 60mg  BID 3. Trazadone 100mg  - 2 at hs  4. Vraylar 3mg  daily though patient assistance.  5. Clonazepam 0.5mg  BID - taking at bedtime  Continue therapy with Bambi Cottle.   RTC 6 weeks  Patient advised to contact office with any questions, adverse effects, or acute worsening in signs and symptoms.  Discussed potential metabolic side effects associated with atypical antipsychotics, as well as potential risk for movement side effects. Advised pt to contact office if movement side effects occur.   Discussed potential benefits, risk, and side effects of benzodiazepines to include potential risk of tolerance and dependence, as well as possible drowsiness.  Advised patient not to drive if experiencing drowsiness and to take lowest possible effective dose to minimize risk of dependence and tolerance.    There are no diagnoses linked to this encounter.   Please see After Visit Summary for patient specific instructions.  Future Appointments  Date  Time Provider Department Center  04/09/2023  2:00 PM Cottle, Lynnell Dike, LCSW [facility] None  04/30/2023  2:00 PM Cottle, Bambi G, LCSW [facility] None  05/21/2023  2:00 PM Cottle, Bambi G, LCSW [facility] None  09/10/2023  9:30 AM [name], Adam R, DO [facility] None    No orders of the defined types were placed in this encounter.     -------------------------------

## 2023-04-03 DIAGNOSIS — M25551 Pain in right hip: Secondary | ICD-10-CM | POA: Diagnosis not present

## 2023-04-03 DIAGNOSIS — M25561 Pain in right knee: Secondary | ICD-10-CM | POA: Diagnosis not present

## 2023-04-07 DIAGNOSIS — M5106 Intervertebral disc disorders with myelopathy, lumbar region: Secondary | ICD-10-CM | POA: Diagnosis not present

## 2023-04-07 DIAGNOSIS — M25551 Pain in right hip: Secondary | ICD-10-CM | POA: Diagnosis not present

## 2023-04-07 DIAGNOSIS — M4326 Fusion of spine, lumbar region: Secondary | ICD-10-CM | POA: Diagnosis not present

## 2023-04-07 DIAGNOSIS — M47816 Spondylosis without myelopathy or radiculopathy, lumbar region: Secondary | ICD-10-CM | POA: Diagnosis not present

## 2023-04-07 DIAGNOSIS — M25561 Pain in right knee: Secondary | ICD-10-CM | POA: Diagnosis not present

## 2023-04-08 ENCOUNTER — Encounter: Payer: Self-pay | Admitting: Sports Medicine

## 2023-04-08 ENCOUNTER — Other Ambulatory Visit: Payer: Self-pay | Admitting: Sports Medicine

## 2023-04-08 DIAGNOSIS — M25551 Pain in right hip: Secondary | ICD-10-CM

## 2023-04-08 DIAGNOSIS — M25552 Pain in left hip: Secondary | ICD-10-CM

## 2023-04-08 DIAGNOSIS — M25561 Pain in right knee: Secondary | ICD-10-CM | POA: Diagnosis not present

## 2023-04-09 ENCOUNTER — Ambulatory Visit (INDEPENDENT_AMBULATORY_CARE_PROVIDER_SITE_OTHER): Payer: Medicare HMO | Admitting: Psychology

## 2023-04-09 DIAGNOSIS — F411 Generalized anxiety disorder: Secondary | ICD-10-CM | POA: Diagnosis not present

## 2023-04-09 DIAGNOSIS — F3341 Major depressive disorder, recurrent, in partial remission: Secondary | ICD-10-CM | POA: Diagnosis not present

## 2023-04-09 DIAGNOSIS — F4310 Post-traumatic stress disorder, unspecified: Secondary | ICD-10-CM

## 2023-04-09 NOTE — Progress Notes (Signed)
[facility] Counselor/Therapist Progress Note  Patient ID: Paige [name], MRN: [id number],    Date: 04/09/2023  Time Spent: 60 minutes  Treatment Type: Individual Therapy  Reported Symptoms: depression and worry  Mental Status Exam: Appearance:  Casual     Behavior: Appropriate  Motor: Normal  Speech/Language:  Normal Rate  Affect: blunted  Mood: pleasant  Thought process: normal  Thought content:   WNL  Sensory/Perceptual disturbances:   WNL  Orientation: oriented to person, place, time/date, and situation  Attention: Good  Concentration: Good  Memory: WNL  Fund of knowledge:  Good  Insight:   Good  Judgment:  Fair  Impulse Control: Fair   Risk Assessment: Danger to Self:  No Self-injurious Behavior: No Danger to Others: No Duty to Warn:no Physical Aggression / Violence:No  Access to Firearms a concern: No  Gang Involvement:No   Subjective: The patient attended a face-to-face individual therapy session via video visit.  The patient gave verbal consent for this session to be on video on WebEx.  The patient was in her home alone and therapist was in the office alone.  The patient presents with a blunted affect and mood was pleasant.  The patient does however appear very sleepy today.  She reports that she needs to go get her CPAP.  The patient continues to see [name] and that seems to be going well and it seems that her mother has accepted the relationship a little more now.  She did report that she got a letter from her old boyfriend and that stresses her out.  We talked about her not needing to respond to it and if she needs to blocking recommended that she do that because she does not need to have any more interactions with him.  She states that she does not want a relationship with him at all.  The patient seems to be doing okay except for her sleepiness.  Encouraged her to go ahead and take care of her sleep needs.   Interventions: Cognitive  Behavioral Therapy  Diagnosis:PTSD (post-traumatic stress disorder)  Recurrent major depressive disorder, in partial remission (HCC)  Generalized anxiety disorder  Plan: Treatment Plan  Strengths/Abilities:  Intelligent, kind, motivated  Treatment Preferences:  Outpatient Individual therapy  Statement of Needs:  "I need some help with getting back out into the world and learn how to interact and be social"   Symptoms:  Describes a reliving of the event, particularly through dissociative flashbacks.:  (Status: improved). Displays a significant decline in interest and engagement in activities.: (Status: improved). Displays significant psychological and/or physiological  distress resulting from internal and external clues that are reminiscent of the traumatic event.:  (Status: improved). Experiences disturbances in sleep.:   (Status: improved). Experiences disturbing and persistent thoughts, images, and/or perceptions of the  traumatic event.: (Status: improved). Experiences frequent nightmares.:  (Status: improved). Has been exposed to a traumatic event involving actual or  perceived threat of death or serious injury.: (Status: maintained).  Hypervigilance (e.g., feeling constantly on edge, experiencing concentration difficulties, having trouble falling or staying asleep, exhibiting a general state of irritability).: (Status:  improved). Impairment in social, occupational, or other areas of functioning.:  (Status: improved). Intentionally avoids activities, places, people, or objects (e.g., up-armored vehicles) that evoke memories of the event.:  (Status: improved). Intentionally avoids  thoughts, feelings, or discussions related to the traumatic event.: (Status:  improved). Reports difficulty concentrating as well as feelings of guilt.:   (Status: improved). Reports response of intense fear, helplessness,  or horror to the traumatic event.: (Status: improved). Symptoms present more than  one month.:  (Status: maintained).    Problems Addressed:  Anxiety, Posttraumatic Stress Disorder (PTSD),  Goals:  LTG:  1. Enhance ability to effectively cope with the full variety of life's worries  and anxieties.  70% 2. No longer avoids persons, places, activities, and objects that are  reminiscent of the traumatic event.  70% 3. No longer experiences intrusive event recollections, avoidance of event  reminders, intense arousal, or disinterest in activities or  relationships.  70% 4. Stabilize anxiety level while increasing ability to function on a daily  basis.  80% 5. Thinks about or openly discusses the traumatic event with others  without experiencing psychological or physiological distress.  80%  STG: 1.Identify and engage in pleasant activities on a daily basis..  80% 2.Identify, challenge, and replace biased, fearful self-talk with positive, realistic, and empowering self talk 3.  Participate in Cognitive Therapy to help identify, challenge, and replace biased, negative, and selfdefeating thoughts resulting from the trauma.  80 % 4.  Participate in Eye Movement Desensitization and Reprocessing (EMDR) to reduce emotional distress  related to traumatic thoughts, feelings, and images.  90%  Target Date:  02/17/2024 Frequency: Biweekly Modality:Individual Therapy Interventions by Therapist:  CBT, EMDR, insight oriented therapy, problem solving  Patient approved treatment plan and is progressing nicely.  Tamrah Victorino G Markea Ruzich, LCSW

## 2023-04-10 ENCOUNTER — Ambulatory Visit
Admission: RE | Admit: 2023-04-10 | Discharge: 2023-04-10 | Disposition: A | Discharge status: Home / Self Care | Payer: Medicare HMO | Source: Ambulatory Visit | Attending: Sports Medicine | Admitting: Sports Medicine

## 2023-04-10 DIAGNOSIS — M25551 Pain in right hip: Secondary | ICD-10-CM

## 2023-04-10 DIAGNOSIS — M25559 Pain in unspecified hip: Secondary | ICD-10-CM | POA: Diagnosis not present

## 2023-04-11 ENCOUNTER — Other Ambulatory Visit: Payer: Medicare HMO

## 2023-04-24 DIAGNOSIS — H04123 Dry eye syndrome of bilateral lacrimal glands: Secondary | ICD-10-CM | POA: Diagnosis not present

## 2023-04-24 DIAGNOSIS — H25813 Combined forms of age-related cataract, bilateral: Secondary | ICD-10-CM | POA: Diagnosis not present

## 2023-04-24 DIAGNOSIS — H524 Presbyopia: Secondary | ICD-10-CM | POA: Diagnosis not present

## 2023-04-24 DIAGNOSIS — H1045 Other chronic allergic conjunctivitis: Secondary | ICD-10-CM | POA: Diagnosis not present

## 2023-04-24 DIAGNOSIS — H31092 Other chorioretinal scars, left eye: Secondary | ICD-10-CM | POA: Diagnosis not present

## 2023-04-30 ENCOUNTER — Ambulatory Visit (INDEPENDENT_AMBULATORY_CARE_PROVIDER_SITE_OTHER): Payer: Medicare HMO | Admitting: Psychology

## 2023-04-30 DIAGNOSIS — F3341 Major depressive disorder, recurrent, in partial remission: Secondary | ICD-10-CM

## 2023-04-30 DIAGNOSIS — F4310 Post-traumatic stress disorder, unspecified: Secondary | ICD-10-CM | POA: Diagnosis not present

## 2023-04-30 DIAGNOSIS — F411 Generalized anxiety disorder: Secondary | ICD-10-CM

## 2023-04-30 NOTE — Progress Notes (Signed)
                Yitzchok Carriger G Marquise Lambson, LCSW 

## 2023-05-01 ENCOUNTER — Other Ambulatory Visit: Payer: Self-pay | Admitting: Sports Medicine

## 2023-05-01 DIAGNOSIS — M25561 Pain in right knee: Secondary | ICD-10-CM

## 2023-05-06 ENCOUNTER — Ambulatory Visit
Admission: RE | Admit: 2023-05-06 | Discharge: 2023-05-06 | Disposition: A | Discharge status: Home / Self Care | Payer: Medicare HMO | Source: Ambulatory Visit | Attending: Sports Medicine | Admitting: Sports Medicine

## 2023-05-06 DIAGNOSIS — M25561 Pain in right knee: Secondary | ICD-10-CM | POA: Diagnosis not present

## 2023-05-06 DIAGNOSIS — M1711 Unilateral primary osteoarthritis, right knee: Secondary | ICD-10-CM | POA: Diagnosis not present

## 2023-05-06 DIAGNOSIS — M25461 Effusion, right knee: Secondary | ICD-10-CM | POA: Diagnosis not present

## 2023-05-09 DIAGNOSIS — L820 Inflamed seborrheic keratosis: Secondary | ICD-10-CM | POA: Diagnosis not present

## 2023-05-09 DIAGNOSIS — L111 Transient acantholytic dermatosis [Grover]: Secondary | ICD-10-CM | POA: Diagnosis not present

## 2023-05-09 DIAGNOSIS — Z85828 Personal history of other malignant neoplasm of skin: Secondary | ICD-10-CM | POA: Diagnosis not present

## 2023-05-14 DIAGNOSIS — M1711 Unilateral primary osteoarthritis, right knee: Secondary | ICD-10-CM | POA: Diagnosis not present

## 2023-05-21 ENCOUNTER — Ambulatory Visit (INDEPENDENT_AMBULATORY_CARE_PROVIDER_SITE_OTHER): Payer: Medicare HMO | Admitting: Psychology

## 2023-05-21 DIAGNOSIS — F411 Generalized anxiety disorder: Secondary | ICD-10-CM

## 2023-05-21 DIAGNOSIS — F4310 Post-traumatic stress disorder, unspecified: Secondary | ICD-10-CM

## 2023-05-21 DIAGNOSIS — M549 Dorsalgia, unspecified: Secondary | ICD-10-CM | POA: Diagnosis not present

## 2023-05-21 DIAGNOSIS — Z6837 Body mass index (BMI) 37.0-37.9, adult: Secondary | ICD-10-CM | POA: Diagnosis not present

## 2023-05-21 DIAGNOSIS — R5382 Chronic fatigue, unspecified: Secondary | ICD-10-CM | POA: Diagnosis not present

## 2023-05-21 DIAGNOSIS — Z79899 Other long term (current) drug therapy: Secondary | ICD-10-CM | POA: Diagnosis not present

## 2023-05-21 DIAGNOSIS — F3341 Major depressive disorder, recurrent, in partial remission: Secondary | ICD-10-CM

## 2023-05-21 DIAGNOSIS — E039 Hypothyroidism, unspecified: Secondary | ICD-10-CM | POA: Diagnosis not present

## 2023-05-21 NOTE — Progress Notes (Addendum)
[facility] Counselor/Therapist Progress Note  Patient ID: Paige [name], MRN: [id number],    Date: 05/21/2023  Time Spent: 60 minutes  Time in: 2:00  Time out:3:00  Treatment Type: Individual Therapy  Reported Symptoms: depression and worry  Mental Status Exam: Appearance:  Casual     Behavior: Appropriate  Motor: Normal  Speech/Language:  Normal Rate  Affect: blunted  Mood: pleasant  Thought process: normal  Thought content:   WNL  Sensory/Perceptual disturbances:   WNL  Orientation: oriented to person, place, time/date, and situation  Attention: Good  Concentration: Good  Memory: WNL  Fund of knowledge:  Good  Insight:   Good  Judgment:  Fair  Impulse Control: Fair   Risk Assessment: Danger to Self:  No Self-injurious Behavior: No Danger to Others: No Duty to Warn:no Physical Aggression / Violence:No  Access to Firearms a concern: No  Gang Involvement:No   Subjective: The patient attended a face-to-face individual therapy session via video visit.  The patient gave verbal consent for this session to be on video on WebEx.  The patient was in her home alone and therapist was in the office alone.  The patient presents with a blunted affect and mood was pleasant.  The patient reports that things are still going well with her relationship.  She states that they just got back from [place] and had a very nice time.  She and [name] went to [place] with another couple that the patient had a relationship with both parties previously.  The patient still has questionable judgment about things and I am a little concerned about this.  It could have easily gone into this other couple wanting them to get into a foursome.  The patient seems very happy with her current relationship and reports that they did not do that when they were gone and she does not feel that it strange that they went with the this couple.  We talked about the patient continuing to do good  things for herself and to make good decisions moving forward I did point out that I thought it was an interesting situation for her to go with this couple and she did not seem to feel like it was odd at all.  We will continue to monitor patient and provide supportive counseling and cognitive behavioral therapy as needed.   Interventions: Cognitive Behavioral Therapy  Diagnosis:PTSD (post-traumatic stress disorder)  Recurrent major depressive disorder, in partial remission (HCC)  Generalized anxiety disorder  Plan: Treatment Plan  Strengths/Abilities:  Intelligent, kind, motivated  Treatment Preferences:  Outpatient Individual therapy  Statement of Needs:  "I need some help with getting back out into the world and learn how to interact and be social"   Symptoms:  Describes a reliving of the event, particularly through dissociative flashbacks.:  (Status: improved). Displays a significant decline in interest and engagement in activities.: (Status: improved). Displays significant psychological and/or physiological  distress resulting from internal and external clues that are reminiscent of the traumatic event.:  (Status: improved). Experiences disturbances in sleep.:   (Status: improved). Experiences disturbing and persistent thoughts, images, and/or perceptions of the  traumatic event.: (Status: improved). Experiences frequent nightmares.:  (Status: improved). Has been exposed to a traumatic event involving actual or  perceived threat of death or serious injury.: (Status: maintained).  Hypervigilance (e.g., feeling constantly on edge, experiencing concentration difficulties, having trouble falling or staying asleep, exhibiting a general state of irritability).: (Status:  improved). Impairment in social, occupational, or other areas of  functioning.:  (Status: improved). Intentionally avoids activities, places, people, or objects (e.g., up-armored vehicles) that evoke memories of the event.:   (Status: improved). Intentionally avoids  thoughts, feelings, or discussions related to the traumatic event.: (Status:  improved). Reports difficulty concentrating as well as feelings of guilt.:   (Status: improved). Reports response of intense fear, helplessness, or horror to the traumatic event.: (Status: improved). Symptoms present more than one month.:  (Status: maintained).    Problems Addressed:  Anxiety, Posttraumatic Stress Disorder (PTSD),  Goals:  LTG:  1. Enhance ability to effectively cope with the full variety of life's worries  and anxieties.  70% 2. No longer avoids persons, places, activities, and objects that are  reminiscent of the traumatic event.  70% 3. No longer experiences intrusive event recollections, avoidance of event  reminders, intense arousal, or disinterest in activities or  relationships.  80% 4. Stabilize anxiety level while increasing ability to function on a daily  basis.  80% 5. Thinks about or openly discusses the traumatic event with others  without experiencing psychological or physiological distress.  80%  STG: 1.Identify and engage in pleasant activities on a daily basis..  80% 2.Identify, challenge, and replace biased, fearful self-talk with positive, realistic, and empowering self talk 3.  Participate in Cognitive Therapy to help identify, challenge, and replace biased, negative, and selfdefeating thoughts resulting from the trauma.  80 % 4.  Participate in Eye Movement Desensitization and Reprocessing (EMDR) to reduce emotional distress  related to traumatic thoughts, feelings, and images.  90%  Target Date:  02/17/2024 Frequency: Every three weeks Modality:Individual Therapy Interventions by Therapist:  CBT, EMDR, insight oriented therapy, problem solving  Patient approved treatment plan and is progressing nicely.  Corretta Munce G Victory Strollo,  LCSW

## 2023-06-04 DIAGNOSIS — M1711 Unilateral primary osteoarthritis, right knee: Secondary | ICD-10-CM | POA: Diagnosis not present

## 2023-06-11 ENCOUNTER — Ambulatory Visit (INDEPENDENT_AMBULATORY_CARE_PROVIDER_SITE_OTHER): Payer: Medicare HMO | Admitting: Psychology

## 2023-06-11 DIAGNOSIS — F3341 Major depressive disorder, recurrent, in partial remission: Secondary | ICD-10-CM | POA: Diagnosis not present

## 2023-06-11 DIAGNOSIS — F4310 Post-traumatic stress disorder, unspecified: Secondary | ICD-10-CM | POA: Diagnosis not present

## 2023-06-11 DIAGNOSIS — F411 Generalized anxiety disorder: Secondary | ICD-10-CM

## 2023-06-11 DIAGNOSIS — M1711 Unilateral primary osteoarthritis, right knee: Secondary | ICD-10-CM | POA: Diagnosis not present

## 2023-06-11 NOTE — Progress Notes (Signed)
                Paige [name] G Paige Lagerstrom, LCSW 

## 2023-06-17 DIAGNOSIS — R5383 Other fatigue: Secondary | ICD-10-CM | POA: Diagnosis not present

## 2023-06-17 DIAGNOSIS — N951 Menopausal and female climacteric states: Secondary | ICD-10-CM | POA: Diagnosis not present

## 2023-06-19 DIAGNOSIS — R232 Flushing: Secondary | ICD-10-CM | POA: Diagnosis not present

## 2023-06-19 DIAGNOSIS — M1711 Unilateral primary osteoarthritis, right knee: Secondary | ICD-10-CM | POA: Diagnosis not present

## 2023-06-19 DIAGNOSIS — Z6838 Body mass index (BMI) 38.0-38.9, adult: Secondary | ICD-10-CM | POA: Diagnosis not present

## 2023-06-19 DIAGNOSIS — N951 Menopausal and female climacteric states: Secondary | ICD-10-CM | POA: Diagnosis not present

## 2023-06-19 DIAGNOSIS — R6882 Decreased libido: Secondary | ICD-10-CM | POA: Diagnosis not present

## 2023-06-19 DIAGNOSIS — Z79890 Hormone replacement therapy (postmenopausal): Secondary | ICD-10-CM | POA: Diagnosis not present

## 2023-06-25 ENCOUNTER — Other Ambulatory Visit: Payer: Self-pay | Admitting: Adult Health

## 2023-06-25 DIAGNOSIS — F331 Major depressive disorder, recurrent, moderate: Secondary | ICD-10-CM

## 2023-06-25 DIAGNOSIS — F4310 Post-traumatic stress disorder, unspecified: Secondary | ICD-10-CM

## 2023-06-25 DIAGNOSIS — F411 Generalized anxiety disorder: Secondary | ICD-10-CM

## 2023-06-25 DIAGNOSIS — G4700 Insomnia, unspecified: Secondary | ICD-10-CM

## 2023-06-25 NOTE — Telephone Encounter (Signed)
Sent MyChart message to schedule

## 2023-06-27 NOTE — Telephone Encounter (Signed)
Please call to schedule, did not respond to MyChart message.

## 2023-06-30 NOTE — Telephone Encounter (Signed)
Pt is scheduled 8/2 

## 2023-07-02 ENCOUNTER — Ambulatory Visit (INDEPENDENT_AMBULATORY_CARE_PROVIDER_SITE_OTHER): Payer: Medicare HMO | Admitting: Psychology

## 2023-07-02 DIAGNOSIS — F3341 Major depressive disorder, recurrent, in partial remission: Secondary | ICD-10-CM

## 2023-07-02 DIAGNOSIS — F4310 Post-traumatic stress disorder, unspecified: Secondary | ICD-10-CM

## 2023-07-02 NOTE — Progress Notes (Unsigned)
                Portia Wisdom G Pharrah Rottman, LCSW

## 2023-07-04 ENCOUNTER — Ambulatory Visit (INDEPENDENT_AMBULATORY_CARE_PROVIDER_SITE_OTHER): Payer: Medicare HMO | Admitting: Adult Health

## 2023-07-04 ENCOUNTER — Encounter: Payer: Self-pay | Admitting: Adult Health

## 2023-07-04 DIAGNOSIS — F331 Major depressive disorder, recurrent, moderate: Secondary | ICD-10-CM

## 2023-07-04 DIAGNOSIS — F4310 Post-traumatic stress disorder, unspecified: Secondary | ICD-10-CM

## 2023-07-04 DIAGNOSIS — F3341 Major depressive disorder, recurrent, in partial remission: Secondary | ICD-10-CM | POA: Diagnosis not present

## 2023-07-04 DIAGNOSIS — G4700 Insomnia, unspecified: Secondary | ICD-10-CM | POA: Diagnosis not present

## 2023-07-04 DIAGNOSIS — F411 Generalized anxiety disorder: Secondary | ICD-10-CM

## 2023-07-04 MED ORDER — BUPROPION HCL ER (XL) 150 MG PO TB24
ORAL_TABLET | ORAL | 3 refills | Status: DC
Start: 2023-07-04 — End: 2024-01-27

## 2023-07-04 MED ORDER — CLONAZEPAM 0.5 MG PO TABS
0.5000 mg | ORAL_TABLET | Freq: Two times a day (BID) | ORAL | 2 refills | Status: DC
Start: 2023-07-04 — End: 2023-10-13

## 2023-07-04 NOTE — Progress Notes (Signed)
Paige [name] [id number] March 09, 1966 57 y.o.  Virtual Visit via Video Note  I connected with pt @ on 07/04/23 at 10:40 AM EDT by a video enabled telemedicine application and verified that I am speaking with the correct person using two identifiers.   I discussed the limitations of evaluation and management by telemedicine and the availability of in person appointments. The patient expressed understanding and agreed to proceed.  I discussed the assessment and treatment plan with the patient. The patient was provided an opportunity to ask questions and all were answered. The patient agreed with the plan and demonstrated an understanding of the instructions.   The patient was advised to call back or seek an in-person evaluation if the symptoms worsen or if the condition fails to improve as anticipated.  I provided 25 minutes of non-face-to-face time during this encounter.  The patient was located at home.  The provider was located at [facility].   Dorothyann Gibbs, NP   Subjective:   Patient ID:  Paige [name] is a 57 y.o. (DOB June 10, 1966) female.  Chief Complaint: No chief complaint on file.   HPI AKEIA PEROT presents for follow-up of GAD, MDD, PTSD and insomnia.  Describes mood today as "ok". Pleasant. Reports tearfulness. Mood symptoms - reports depression and irritability. Reports some anxiety.  Denies worry, rumination and over thinking. Reports hyper focusing. Mood is variable.  Stating "I'm not feeling too good". Reports some hormonal imbalances - working with [facility]. Feels like medications are helpful. Continues to see therapist Optician, dispensing).  Energy levels lower. Active, water aerobics. Enjoys some usual interests and activities. In a relationship. Lives with partner, his 2 cats, her cat "[name]" - and 3 outdoor cats - "[name]". Mother local and supportive. Attends church. Appetite adequate. Weight fluctuates - 230 to 240  pounds. Sleeps better some nights than others. Averages 7 hours of broken sleep. Recently diagnosed with sleep apnea - using CPAP machine. Denies daytime napping. Focus and concentration "fair". Completing tasks around the house. Disabled since 2009. Denies SI or HI.  Denies AH or VH. Denies self harm. Denies substance use.  Working with P/T.  Previous medication trials: Lexapro, Paxil, Trazadone, Wellbutrin SR, Geodon, Seroquel, Risperdal, Depakote, Topamax, Lithium, Provigil, Ambien, Cytomel, Clonazepam and other benzodixepaines, Elavil, Effexor, Remeron, Prozac, Anafranil, Tofranil, Zoloft, Geodon, Buspar, Adderall, Ritalin, Abilify, Rexulti, Lamictal, Latuda  Review of Systems:  Review of Systems  Musculoskeletal:  Negative for gait problem.  Neurological:  Negative for tremors.  Psychiatric/Behavioral:         Please refer to HPI    Medications: I have reviewed the patient's current medications.  Current Outpatient Medications  Medication Sig Dispense Refill   buPROPion (WELLBUTRIN XL) 150 MG 24 hr tablet TAKE 3 TABLETS EVERY MORNING 270 tablet 3   cariprazine (VRAYLAR) 3 MG capsule Take 1 capsule (3 mg total) by mouth daily. 90 capsule 3   cetirizine (ZYRTEC) 10 MG tablet Take by mouth.     clonazePAM (KLONOPIN) 0.5 MG tablet Take 1 tablet (0.5 mg total) by mouth 2 (two) times daily. 60 tablet 2   dicyclomine (BENTYL) 10 MG capsule Take 1 capsule (10 mg total) by mouth 3 (three) times daily as needed for spasms. 90 capsule 11   DULoxetine (CYMBALTA) 60 MG capsule TAKE 1 CAPSULE TWICE DAILY 180 capsule 3   Erenumab-aooe (AIMOVIG) 70 MG/ML SOAJ INJECT 70 MG INTO THE SKIN EVERY 28 DAYS. 1 mL 11   Fe Fum-Fe Poly-Vit  C-Lactobac (FUSION) 65-65-25-30 MG CAPS Take by mouth.     ipratropium (ATROVENT) 0.06 % nasal spray      levothyroxine (SYNTHROID, LEVOTHROID) 88 MCG tablet Take 88 mcg by mouth daily before breakfast.     liothyronine (CYTOMEL) 5 MCG tablet Take 5 mcg by mouth daily.  Monday Wednesday Friday     nystatin cream (MYCOSTATIN) Apply 1 application  topically daily as needed for dry skin.     omeprazole (PRILOSEC) 20 MG capsule      ondansetron (ZOFRAN-ODT) 4 MG disintegrating tablet Take 1 tablet (4 mg total) by mouth every 8 (eight) hours as needed for nausea or vomiting. 20 tablet 6   Oxycodone HCl 10 MG TABS Take 10 mg by mouth every 6 (six) hours as needed.     Polyethyl Glycol-Propyl Glycol 0.4-0.3 % SOLN Apply 1 drop to eye as needed.     polyethylene glycol (MIRALAX / GLYCOLAX) 17 g packet Take 17 g by mouth 2 (two) times daily.     potassium chloride (K-DUR,KLOR-CON) 10 MEQ tablet Take 1 tablet by mouth daily.     progesterone (PROMETRIUM) 200 MG capsule Take 200 mg by mouth at bedtime.     promethazine (PHENERGAN) 12.5 MG tablet Take 1 tablet (12.5 mg total) by mouth every 8 (eight) hours as needed for nausea or vomiting. 30 tablet 0   rOPINIRole (REQUIP) 2 MG tablet Take 2 mg by mouth 2 (two) times daily.     terconazole (TERAZOL 3) 0.8 % vaginal cream Place 1 applicator vaginally at bedtime.     tiZANidine (ZANAFLEX) 2 MG tablet Take 2 mg by mouth 3 (three) times daily.     torsemide (DEMADEX) 20 MG tablet Take 20 mg by mouth daily.      traZODone (DESYREL) 100 MG tablet Take 2 tablets (200 mg total) by mouth at bedtime. 180 tablet 3   Ubrogepant (UBRELVY) 100 MG TABS Take 1 tablet by mouth daily as needed (at onset of headaches may repeat dose after 2 hours as needed max 200 mg in 24 hours). 16 tablet 11   Current Facility-Administered Medications  Medication Dose Route Frequency Provider Last Rate Last Admin   0.9 %  sodium chloride infusion  500 mL Intravenous Once Meryl Dare, MD        Medication Side Effects: None  Allergies:  Allergies  Allergen Reactions   Cyclobenzaprine Other (See Comments)    Other reaction(s): Unknown Hallucinations Unknown    Meloxicam Other (See Comments)   Naltrexone Other (See Comments)    SEVERE PAIN  ALL OVER   Pork (Porcine) Protein Other (See Comments)    Other reaction(s): Other (See Comments) Migraines Other reaction(s): Other (See Comments) Migraines   Sumatriptan Other (See Comments)    Difficulty breathing Other reaction(s): Other (see comments) Off balance    Tape Dermatitis   Flexeril [Cyclobenzaprine Hcl]    Imitrex [Sumatriptan Base]    Other    Sulfa Antibiotics     Pt states she does not think she is allergic to Sulfa     Past Medical History:  Diagnosis Date   Allergic rhinitis    Allergy    Anemia    Anxiety disorder    Cancer (HCC)    skin   Chronic fatigue    Chronic headaches    Chronic pain    Depression    Endometriosis    Family history of colon cancer    mother,uncles,aunts   Fibromyalgia  Gallstones    GERD (gastroesophageal reflux disease)    Hypothyroidism    IBS (irritable bowel syndrome)    Insomnia    Iron deficiency    Laryngopharyngeal reflux (LPR)    Obesity    Personal history of colonic polyps 04/2006   polypoid   PONV (postoperative nausea and vomiting)    RLS (restless legs syndrome)    Sleep apnea    no cpap    Family History  Problem Relation Age of Onset   Colon cancer Mother        uncles,aunts   Diabetes Father        paternal grandmother,sister   Leukemia Father    Lymphoma Father    Skin cancer Father    Heart disease Father    Colon polyps Sister    Diabetes Sister    Colon cancer Maternal Aunt    Colon cancer Maternal Uncle    Colon polyps Cousin    Stomach cancer Neg Hx    Esophageal cancer Neg Hx    Rectal cancer Neg Hx     Social History   Socioeconomic History   Marital status: Single    Spouse name: Not on file   Number of children: 0   Years of education: Not on file   Highest education level: Not on file  Occupational History   Occupation: RN/disabled  Tobacco Use   Smoking status: Never   Smokeless tobacco: Never  Vaping Use   Vaping status: Never Used  Substance and Sexual  Activity   Alcohol use: Not Currently    Alcohol/week: 0.0 standard drinks of alcohol    Comment: rarely   Drug use: No   Sexual activity: Not on file  Other Topics Concern   Not on file  Social History Narrative   Patient is left-handed. She lives alone in a one level home. She has 3 cats. She is unable to exercise. Caffeine one soda / day. On disability now. 2 bachelor degrees   Social Determinants of Health   Financial Resource Strain: Not on file  Food Insecurity: Not on file  Transportation Needs: Not on file  Physical Activity: Not on file  Stress: Not on file  Social Connections: Not on file  Intimate Partner Violence: Not on file    Past Medical History, Surgical history, Social history, and Family history were reviewed and updated as appropriate.   Please see review of systems for further details on the patient's review from today.   Objective:   Physical Exam:  There were no vitals taken for this visit.  Physical Exam Constitutional:      General: She is not in acute distress. Musculoskeletal:        General: No deformity.  Neurological:     Mental Status: She is alert and oriented to person, place, and time.     Coordination: Coordination normal.  Psychiatric:        Attention and Perception: Attention and perception normal. She does not perceive auditory or visual hallucinations.        Mood and Affect: Mood normal. Mood is not anxious or depressed. Affect is not labile, blunt, angry or inappropriate.        Speech: Speech normal.        Behavior: Behavior normal.        Thought Content: Thought content normal. Thought content is not paranoid or delusional. Thought content does not include homicidal or suicidal ideation. Thought content does not include homicidal or  suicidal plan.        Cognition and Memory: Cognition and memory normal.        Judgment: Judgment normal.     Comments: Insight intact     Lab Review:     Component Value Date/Time   NA  140 05/26/2019 1046   K 3.4 (L) 05/26/2019 1046   CL 95 (L) 05/26/2019 1046   CO2 37 (H) 05/26/2019 1046   GLUCOSE 97 05/26/2019 1046   BUN 17 05/26/2019 1046   CREATININE 1.09 05/26/2019 1046   CALCIUM 9.3 05/26/2019 1046   PROT 6.9 05/26/2019 1046   ALBUMIN 4.2 05/26/2019 1046   AST 36 05/26/2019 1046   ALT 30 05/26/2019 1046   ALKPHOS 98 05/26/2019 1046   BILITOT 0.4 05/26/2019 1046       Component Value Date/Time   WBC 6.9 05/26/2019 1046   RBC 4.42 05/26/2019 1046   HGB 12.6 05/26/2019 1046   HCT 38.7 05/26/2019 1046   PLT 209.0 05/26/2019 1046   MCV 87.6 05/26/2019 1046   MCHC 32.5 05/26/2019 1046   RDW 14.6 05/26/2019 1046   LYMPHSABS 2.0 05/26/2019 1046   MONOABS 0.6 05/26/2019 1046   EOSABS 0.1 05/26/2019 1046   BASOSABS 0.0 05/26/2019 1046    No results found for: "POCLITH", "LITHIUM"   No results found for: "PHENYTOIN", "PHENOBARB", "VALPROATE", "CBMZ"   .res Assessment: Plan:    Plan:  1. Wellbutrin XL 450mg  daily - in the am  2. Cymbalta 60mg  BID 3. Trazadone 100mg  - 2 at hs  4. Vraylar 3mg  daily though patient assistance.  5. Clonazepam 0.5mg  BID - taking at bedtime  Continue therapy with Bambi Cottle.   RTC 6 weeks  Patient advised to contact office with any questions, adverse effects, or acute worsening in signs and symptoms.  Discussed potential metabolic side effects associated with atypical antipsychotics, as well as potential risk for movement side effects. Advised pt to contact office if movement side effects occur.   Discussed potential benefits, risk, and side effects of benzodiazepines to include potential risk of tolerance and dependence, as well as possible drowsiness.  Advised patient not to drive if experiencing drowsiness and to take lowest possible effective dose to minimize risk of dependence and tolerance.    Diagnoses and all orders for this visit:  Recurrent major depressive disorder, in partial remission  (HCC)  Generalized anxiety disorder -     buPROPion (WELLBUTRIN XL) 150 MG 24 hr tablet; TAKE 3 TABLETS EVERY MORNING -     clonazePAM (KLONOPIN) 0.5 MG tablet; Take 1 tablet (0.5 mg total) by mouth 2 (two) times daily.  PTSD (post-traumatic stress disorder)  Insomnia, unspecified type  Major depressive disorder, recurrent episode, moderate (HCC) -     buPROPion (WELLBUTRIN XL) 150 MG 24 hr tablet; TAKE 3 TABLETS EVERY MORNING     Please see After Visit Summary for patient specific instructions.  Future Appointments  Date Time Provider Department Center  07/23/2023  2:00 PM Cottle, Lynnell Dike, [place] [facility] None  08/13/2023  2:00 PM Cottle, Bambi G, LCSW [facility] None  09/10/2023  9:30 AM Shon Millet R, DO [facility] None  09/17/2023  2:00 PM Cottle, Bambi G, LCSW [facility] None    No orders of the defined types were placed in this encounter.     -------------------------------

## 2023-07-05 IMAGING — XA DG MYELOGRAPHY LUMBAR INJ LUMBOSACRAL
13 of 20 series · 13 of 20 positions shown · non-contrast
Comparison: CT lumbar myelogram dated June 02, 2020. MRI lumbar
spine dated July 15, 2018.

CLINICAL DATA: Chronic right-sided low back pain radiating to the
right hip. History of prior lumbar fusion.
TECHNIQUE: Contiguous axial images were obtained through the lumbar spine after
the intrathecal infusion of contrast. Coronal and sagittal
reconstructions were obtained of the axial image sets.

[Series 1: vasc standard · 1 of 1 slices shown (1 of 11)]
[im 1/1]
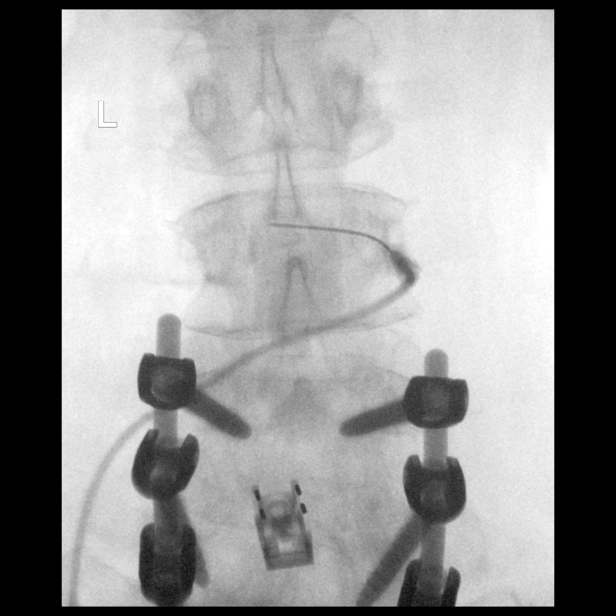

[Series 2: vasc standard · 1 of 1 slices shown (2 of 11)]
[im 1/1]
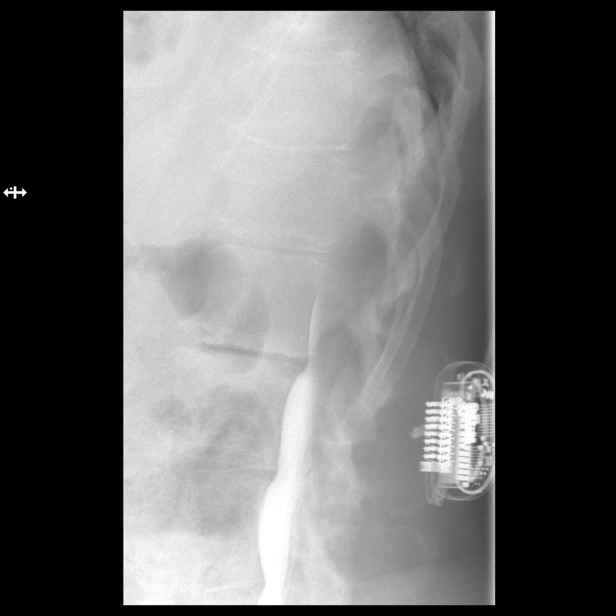

[Series 2: w lumbar spine flexion · 0.15mm/px · 1 of 1 slices shown]
[im 1/1]
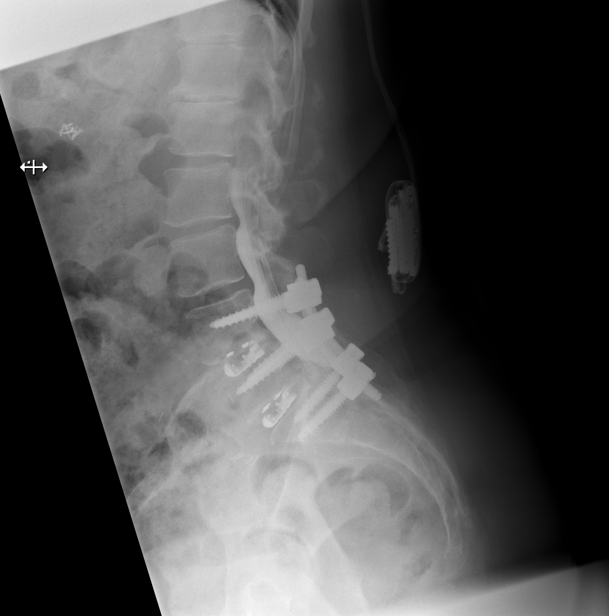

[Series 3: w lumbar spine extension · 0.15mm/px · 1 of 1 slices shown]
[im 1/1]
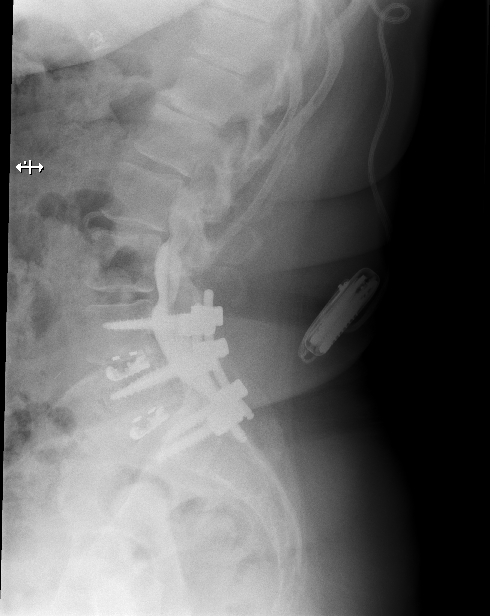

[Series 4: vasc standard · 1 of 1 slices shown (3 of 11)]
[im 1/1]
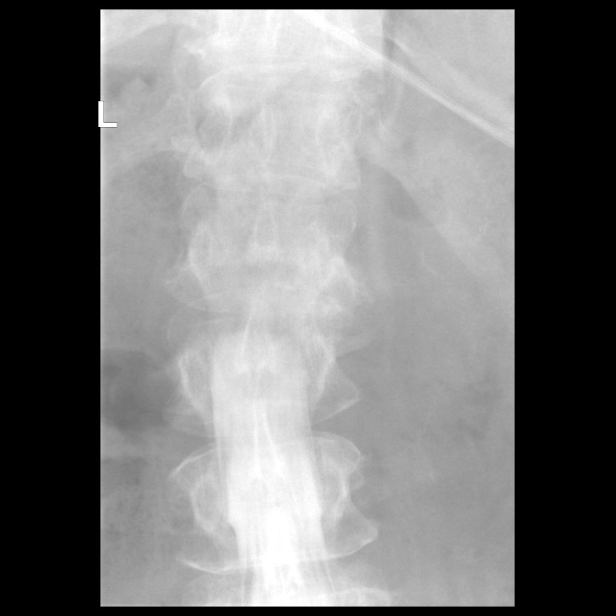

[Series 6: vasc standard · 1 of 1 slices shown (4 of 11)]
[im 1/1]
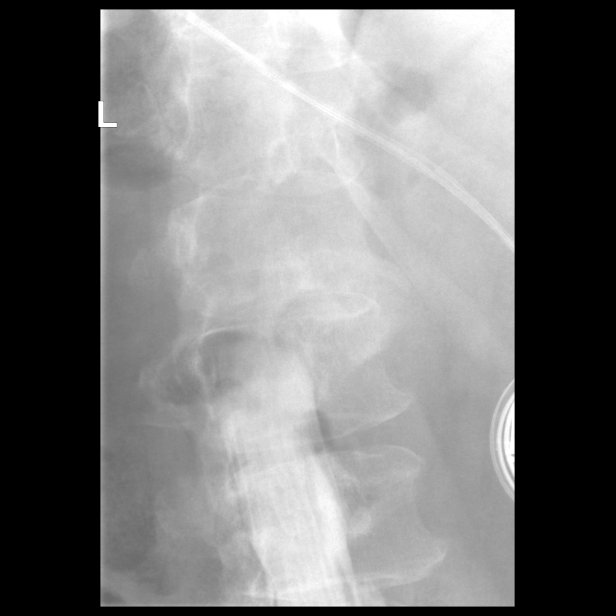

[Series 8: vasc standard · 1 of 1 slices shown (5 of 11)]
[im 1/1]
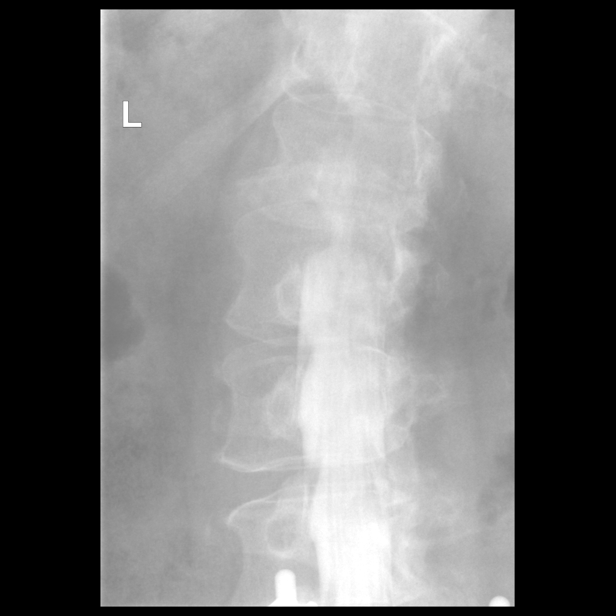

[Series 9: vasc standard · 1 of 1 slices shown (6 of 11)]
[im 1/1]
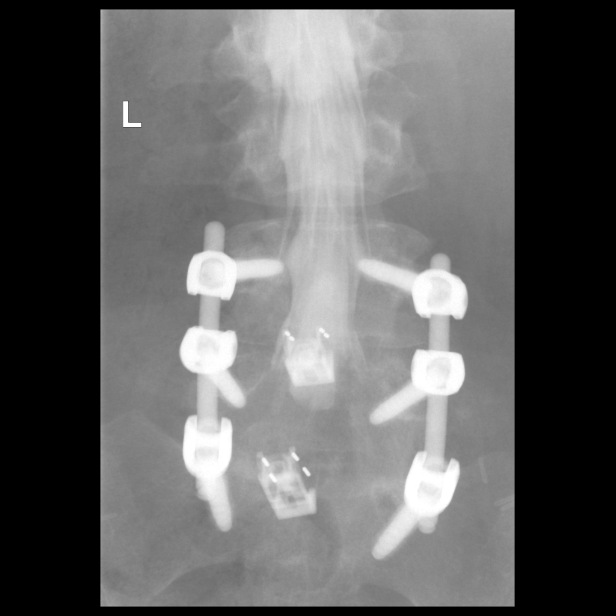

[Series 11: vasc standard · 1 of 1 slices shown (7 of 11)]
[im 1/1]
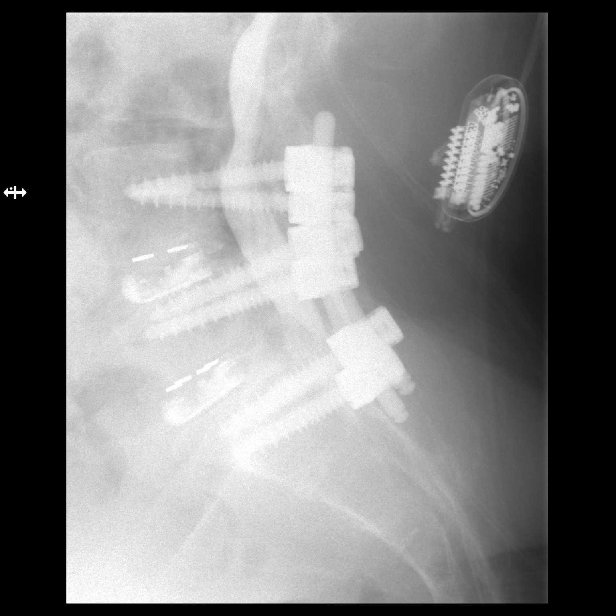

[Series 12: vasc standard · 1 of 1 slices shown (8 of 11)]
[im 1/1]
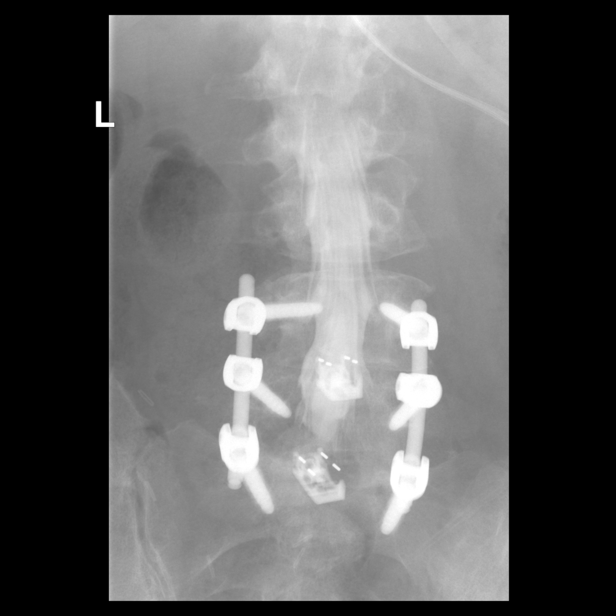

[Series 14: vasc standard · 1 of 1 slices shown (9 of 11)]
[im 1/1]
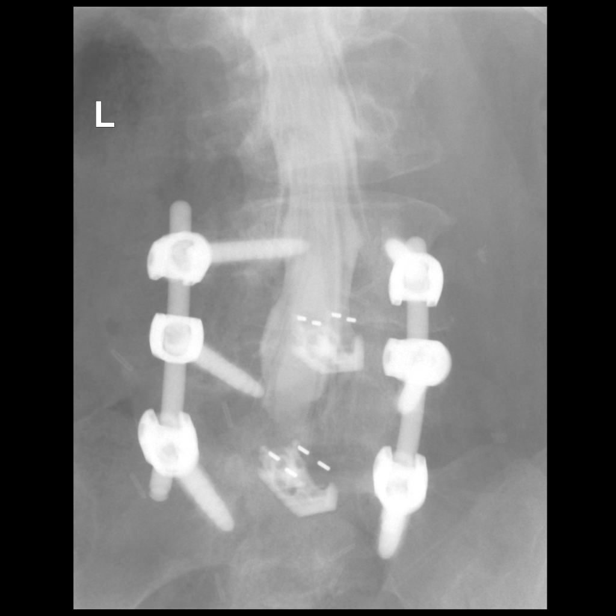

[Series 15: vasc standard · 1 of 1 slices shown (10 of 11)]
[im 1/1]
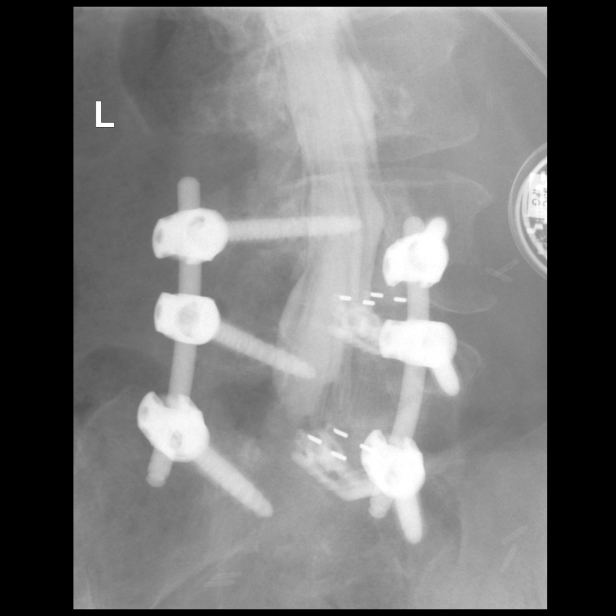

[Series 17: vasc standard · 1 of 1 slices shown (11 of 11)]
[im 1/1]
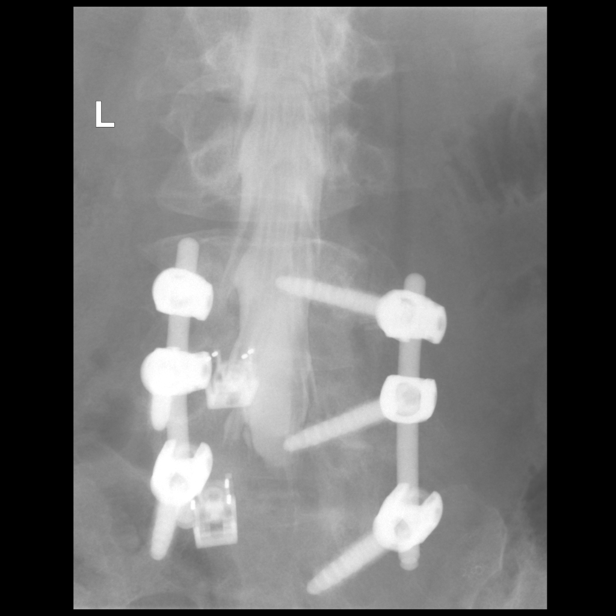

[13 of 20 positions shown; findings below may reference images not displayed]

EXAM:
LUMBAR MYELOGRAM

CT LUMBAR MYELOGRAM

FLUOROSCOPY TIME:  Radiation Exposure Index (as provided by the
fluoroscopic device): 22.3 mGy Kerma

PROCEDURE:
After thorough discussion of risks and benefits of the procedure
including bleeding, infection, injury to nerves, blood vessels,
adjacent structures as well as headache and CSF leak, written and
oral informed consent was obtained. Consent was obtained by Dr.
Jhon Jayro Astucuri. Time out form was completed.

Patient was positioned prone on the fluoroscopy table. Local
anesthesia was provided with 1% lidocaine without epinephrine after
prepped and draped in the usual sterile fashion. Puncture was
performed at L3-L4 using a 5 inch 22-gauge spinal needle via right
interlaminar approach. Using a single pass through the dura, the
needle was placed within the thecal sac, with return of clear CSF.
15 mL of Isovue 8-V22 was injected into the thecal sac, with normal
opacification of the nerve roots and cauda equina consistent with
free flow within the subarachnoid space.

I personally performed the lumbar puncture and administered the
intrathecal contrast. I also personally supervised acquisition of
the myelogram images.
FINDINGS: LUMBAR MYELOGRAM FINDINGS:

Transitional anatomy. Prior L5-S2 PLIF. Normal alignment. Small
ventral extradural defects at L3-L4 and L4-L5. No high-grade spinal
canal stenosis or nerve root effacement.

CT LUMBAR MYELOGRAM FINDINGS:

Segmentation: 6 lumbar type vertebral bodies again noted with a
fully lumbarized S1.

Alignment: Unchanged trace fused anterolisthesis at L4-L5.

Vertebrae: Prior L5-S2 PLIF. Partial interbody and solid posterior
fusion at both levels. No evidence of hardware failure or loosening.
No acute fracture or other focal pathologic process.

Conus medullaris and cauda equina: Conus extends to the L2 level.
Conus and cauda equina appear normal.

Paraspinal and other soft tissues: Negative.

Disc levels:

T12-L1: Unchanged small central and left paracentral disc protrusion
contacting the ventral cord. No stenosis.

L1-L2: Progressive right-sided degenerative endplate sclerosis and
endplate irregularity with bone-on-bone apposition. Progressive mild
disc bulging asymmetric to the right encroaching on the right
lateral recess. No stenosis.

L2-L3:  Unchanged mild disc bulging.  No stenosis.

L3-L4: Unchanged mild disc bulging and left facet arthropathy. No
stenosis.

L4-L5: Unchanged mild disc bulging. Progressive mild-to-moderate
bilateral facet arthropathy. No stenosis.

L5-S1: Prior posterior decompression and PLIF. No residual stenosis.

S1-S2: Prior posterior decompression and PLIF.  No stenosis.
IMPRESSION: 1. Transitional anatomy again noted. Numbering is the same as prior
lumbar myelogram from June 2020. Progressive asymmetric right-sided degenerative disc disease at
L1-L2 with bone-on-bone apposition and sclerosis, which can be a
source of back pain. No stenosis or impingement at this level.
3. Prior L5-S2 PLIF without residual stenosis.
4. Progressive facet arthropathy at L4-L5.

## 2023-07-05 IMAGING — CT CT L SPINE W/ CM
1 of 8 series · 5 of 14 positions shown, 7 images · non-contrast
Comparison: CT lumbar myelogram dated June 02, 2020. MRI lumbar
spine dated July 15, 2018.

CLINICAL DATA: Chronic right-sided low back pain radiating to the
right hip. History of prior lumbar fusion.
TECHNIQUE: Contiguous axial images were obtained through the lumbar spine after
the intrathecal infusion of contrast. Coronal and sagittal
reconstructions were obtained of the axial image sets.

[Series 3: l spine soft · axial · 0.40mm/px · z∈[+500,+680]mm · 5 of 136 slices shown, 7 images]
[im 23/136  soft-tissue]
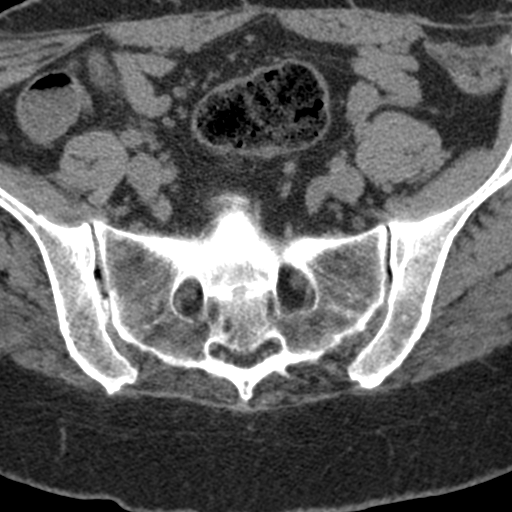
[im 23/136  bone]
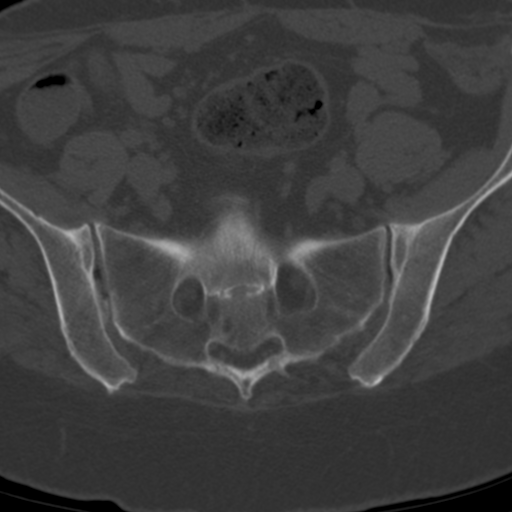
[im 46/136  bone]
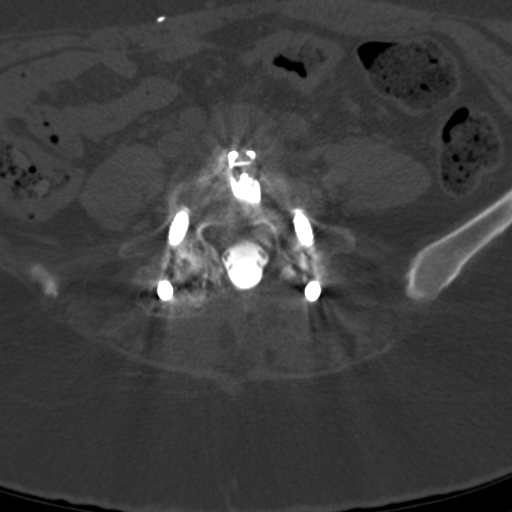
[im 68/136  bone]
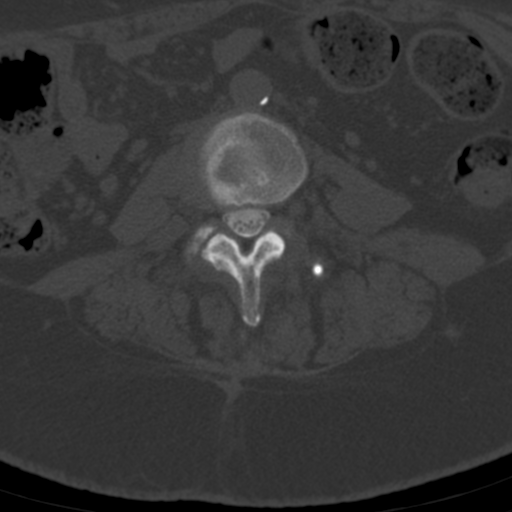
[im 91/136  bone]
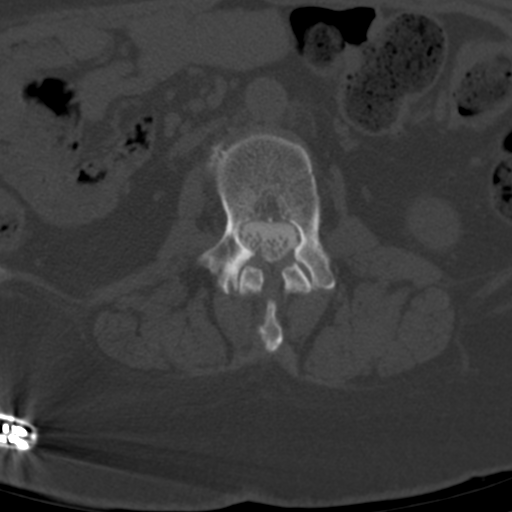
[im 113/136  soft-tissue]
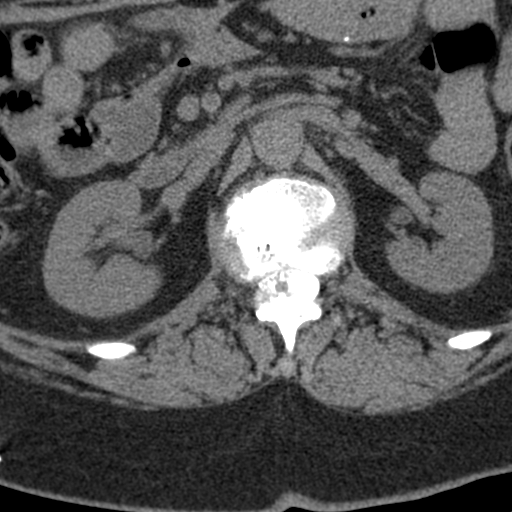
[im 113/136  bone]
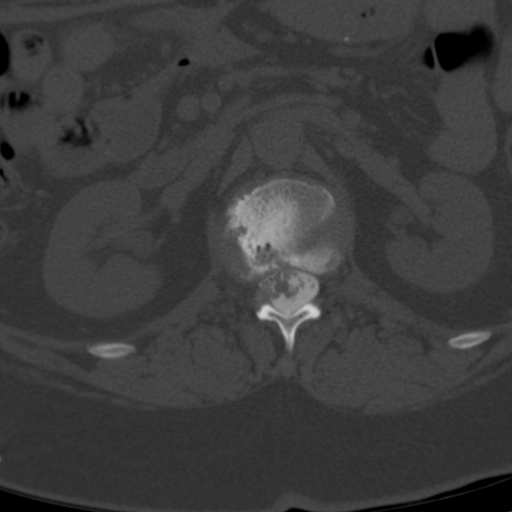

[5 of 14 positions shown; findings below may reference images not displayed]

EXAM:
LUMBAR MYELOGRAM

CT LUMBAR MYELOGRAM

FLUOROSCOPY TIME:  Radiation Exposure Index (as provided by the
fluoroscopic device): 22.3 mGy Kerma

PROCEDURE:
After thorough discussion of risks and benefits of the procedure
including bleeding, infection, injury to nerves, blood vessels,
adjacent structures as well as headache and CSF leak, written and
oral informed consent was obtained. Consent was obtained by Dr.
Jhon Jayro Astucuri. Time out form was completed.

Patient was positioned prone on the fluoroscopy table. Local
anesthesia was provided with 1% lidocaine without epinephrine after
prepped and draped in the usual sterile fashion. Puncture was
performed at L3-L4 using a 5 inch 22-gauge spinal needle via right
interlaminar approach. Using a single pass through the dura, the
needle was placed within the thecal sac, with return of clear CSF.
15 mL of Isovue 8-V22 was injected into the thecal sac, with normal
opacification of the nerve roots and cauda equina consistent with
free flow within the subarachnoid space.

I personally performed the lumbar puncture and administered the
intrathecal contrast. I also personally supervised acquisition of
the myelogram images.
FINDINGS: LUMBAR MYELOGRAM FINDINGS:

Transitional anatomy. Prior L5-S2 PLIF. Normal alignment. Small
ventral extradural defects at L3-L4 and L4-L5. No high-grade spinal
canal stenosis or nerve root effacement.

CT LUMBAR MYELOGRAM FINDINGS:

Segmentation: 6 lumbar type vertebral bodies again noted with a
fully lumbarized S1.

Alignment: Unchanged trace fused anterolisthesis at L4-L5.

Vertebrae: Prior L5-S2 PLIF. Partial interbody and solid posterior
fusion at both levels. No evidence of hardware failure or loosening.
No acute fracture or other focal pathologic process.

Conus medullaris and cauda equina: Conus extends to the L2 level.
Conus and cauda equina appear normal.

Paraspinal and other soft tissues: Negative.

Disc levels:

T12-L1: Unchanged small central and left paracentral disc protrusion
contacting the ventral cord. No stenosis.

L1-L2: Progressive right-sided degenerative endplate sclerosis and
endplate irregularity with bone-on-bone apposition. Progressive mild
disc bulging asymmetric to the right encroaching on the right
lateral recess. No stenosis.

L2-L3:  Unchanged mild disc bulging.  No stenosis.

L3-L4: Unchanged mild disc bulging and left facet arthropathy. No
stenosis.

L4-L5: Unchanged mild disc bulging. Progressive mild-to-moderate
bilateral facet arthropathy. No stenosis.

L5-S1: Prior posterior decompression and PLIF. No residual stenosis.

S1-S2: Prior posterior decompression and PLIF.  No stenosis.
IMPRESSION: 1. Transitional anatomy again noted. Numbering is the same as prior
lumbar myelogram from June 2020. Progressive asymmetric right-sided degenerative disc disease at
L1-L2 with bone-on-bone apposition and sclerosis, which can be a
source of back pain. No stenosis or impingement at this level.
3. Prior L5-S2 PLIF without residual stenosis.
4. Progressive facet arthropathy at L4-L5.

## 2023-07-09 DIAGNOSIS — Z79899 Other long term (current) drug therapy: Secondary | ICD-10-CM | POA: Diagnosis not present

## 2023-07-09 DIAGNOSIS — E559 Vitamin D deficiency, unspecified: Secondary | ICD-10-CM | POA: Diagnosis not present

## 2023-07-16 ENCOUNTER — Other Ambulatory Visit: Payer: Self-pay | Admitting: Adult Health

## 2023-07-16 DIAGNOSIS — F411 Generalized anxiety disorder: Secondary | ICD-10-CM

## 2023-07-16 DIAGNOSIS — F331 Major depressive disorder, recurrent, moderate: Secondary | ICD-10-CM

## 2023-07-23 ENCOUNTER — Ambulatory Visit (INDEPENDENT_AMBULATORY_CARE_PROVIDER_SITE_OTHER): Payer: Medicare HMO | Admitting: Psychology

## 2023-07-23 DIAGNOSIS — F411 Generalized anxiety disorder: Secondary | ICD-10-CM | POA: Diagnosis not present

## 2023-07-23 DIAGNOSIS — F3341 Major depressive disorder, recurrent, in partial remission: Secondary | ICD-10-CM | POA: Diagnosis not present

## 2023-07-23 DIAGNOSIS — F4310 Post-traumatic stress disorder, unspecified: Secondary | ICD-10-CM | POA: Diagnosis not present

## 2023-07-23 NOTE — Progress Notes (Signed)
[facility] Counselor/Therapist Progress Note  Patient ID: Paige [name], MRN: [id number],    Date: 07/23/2023  Time Spent: 49 minutes  Time in: 2:00  Time out:2:49  Treatment Type: Individual Therapy  Reported Symptoms: depression and worry  Mental Status Exam: Appearance:  Casual     Behavior: Appropriate  Motor: Normal  Speech/Language:  Normal Rate  Affect: blunted  Mood: pleasant  Thought process: normal  Thought content:   WNL  Sensory/Perceptual disturbances:   WNL  Orientation: oriented to person, place, time/date, and situation  Attention: Good  Concentration: Good  Memory: WNL  Fund of knowledge:  Good  Insight:   Good  Judgment:  Fair  Impulse Control: Fair   Risk Assessment: Danger to Self:  No Self-injurious Behavior: No Danger to Others: No Duty to Warn:no Physical Aggression / Violence:No  Access to Firearms a concern: No  Gang Involvement:No   Subjective: The patient attended a face-to-face individual therapy session via video visit.  The patient gave verbal consent for this session to be on video on caregility and patient is aware of the limitations of telehealth..  The patient was in her home alone and therapist was in the office alone.  The patient presents with a blunted affect and mood was pleasant.  Patient presents as very sleepy today.  She states that she has not been feeling good and she has been struggling with wanting to sleep all the time.  I ask her some questions about what is going on with her physically and it seems that there may be something off physically.  She seems to be feeling fine as far as her life and it does not seem that she is depressed at all.  We talked about this differing from dissociation and she is currently very with it and it seems that it could be something related to her physical being.  I encouraged her to call the doctor after we got off of our call today.  We also discussed what is going on in  her life otherwise and things are going well with Paige [name].    Interventions: Cognitive Behavioral Therapy  Diagnosis:Recurrent major depressive disorder, in partial remission (HCC)  Generalized anxiety disorder  PTSD (post-traumatic stress disorder)  Plan: Treatment Plan  Strengths/Abilities:  Intelligent, kind, motivated  Treatment Preferences:  Outpatient Individual therapy  Statement of Needs:  "I need some help with getting back out into the world and learn how to interact and be social"   Symptoms:  Describes a reliving of the event, particularly through dissociative flashbacks.:  (Status: improved). Displays a significant decline in interest and engagement in activities.: (Status: improved). Displays significant psychological and/or physiological  distress resulting from internal and external clues that are reminiscent of the traumatic event.:  (Status: improved). Experiences disturbances in sleep.:   (Status: improved). Experiences disturbing and persistent thoughts, images, and/or perceptions of the  traumatic event.: (Status: improved). Experiences frequent nightmares.:  (Status: improved). Has been exposed to a traumatic event involving actual or  perceived threat of death or serious injury.: (Status: maintained).  Hypervigilance (e.g., feeling constantly on edge, experiencing concentration difficulties, having trouble falling or staying asleep, exhibiting a general state of irritability).: (Status:  improved). Impairment in social, occupational, or other areas of functioning.:  (Status: improved). Intentionally avoids activities, places, people, or objects (e.g., up-armored vehicles) that evoke memories of the event.:  (Status: improved). Intentionally avoids  thoughts, feelings, or discussions related to the traumatic event.: (Status:  improved).  Reports difficulty concentrating as well as feelings of guilt.:   (Status: improved). Reports response of intense fear,  helplessness, or horror to the traumatic event.: (Status: improved). Symptoms present more than one month.:  (Status: maintained).    Problems Addressed:  Anxiety, Posttraumatic Stress Disorder (PTSD),  Goals:  LTG:  1. Enhance ability to effectively cope with the full variety of life's worries  and anxieties.  80% 2. No longer avoids persons, places, activities, and objects that are  reminiscent of the traumatic event.  80% 3. No longer experiences intrusive event recollections, avoidance of event  reminders, intense arousal, or disinterest in activities or  relationships.  90% 4. Stabilize anxiety level while increasing ability to function on a daily  basis.  90% 5. Thinks about or openly discusses the traumatic event with others  without experiencing psychological or physiological distress.  80%  STG: 1.Identify and engage in pleasant activities on a daily basis..  90% 2.Identify, challenge, and replace biased, fearful self-talk with positive, realistic, and empowering self talk 3.  Participate in Cognitive Therapy to help identify, challenge, and replace biased, negative, and selfdefeating thoughts resulting from the trauma.  80 % 4.  Participate in Eye Movement Desensitization and Reprocessing (EMDR) to reduce emotional distress  related to traumatic thoughts, feelings, and images.  90%  Target Date:  02/17/2024 Frequency: Every three weeks Modality:Individual Therapy Interventions by Therapist:  CBT, EMDR, insight oriented therapy, problem solving  Patient approved treatment plan and is progressing nicely.  Conswella Bruney G Juanpablo Ciresi, LCSW

## 2023-07-24 DIAGNOSIS — R2681 Unsteadiness on feet: Secondary | ICD-10-CM | POA: Diagnosis not present

## 2023-07-24 DIAGNOSIS — R58 Hemorrhage, not elsewhere classified: Secondary | ICD-10-CM | POA: Diagnosis not present

## 2023-07-24 DIAGNOSIS — G4730 Sleep apnea, unspecified: Secondary | ICD-10-CM | POA: Diagnosis not present

## 2023-07-24 DIAGNOSIS — K047 Periapical abscess without sinus: Secondary | ICD-10-CM | POA: Diagnosis not present

## 2023-07-24 DIAGNOSIS — E039 Hypothyroidism, unspecified: Secondary | ICD-10-CM | POA: Diagnosis not present

## 2023-07-24 DIAGNOSIS — Z6839 Body mass index (BMI) 39.0-39.9, adult: Secondary | ICD-10-CM | POA: Diagnosis not present

## 2023-07-24 DIAGNOSIS — R5383 Other fatigue: Secondary | ICD-10-CM | POA: Diagnosis not present

## 2023-08-05 DIAGNOSIS — Z79899 Other long term (current) drug therapy: Secondary | ICD-10-CM | POA: Diagnosis not present

## 2023-08-08 DIAGNOSIS — R413 Other amnesia: Secondary | ICD-10-CM | POA: Diagnosis not present

## 2023-08-08 DIAGNOSIS — R2681 Unsteadiness on feet: Secondary | ICD-10-CM | POA: Diagnosis not present

## 2023-08-12 ENCOUNTER — Telehealth (INDEPENDENT_AMBULATORY_CARE_PROVIDER_SITE_OTHER): Payer: Medicare HMO | Admitting: Adult Health

## 2023-08-12 ENCOUNTER — Encounter: Payer: Self-pay | Admitting: Adult Health

## 2023-08-12 DIAGNOSIS — G4700 Insomnia, unspecified: Secondary | ICD-10-CM

## 2023-08-12 DIAGNOSIS — F411 Generalized anxiety disorder: Secondary | ICD-10-CM | POA: Diagnosis not present

## 2023-08-12 DIAGNOSIS — F3341 Major depressive disorder, recurrent, in partial remission: Secondary | ICD-10-CM | POA: Diagnosis not present

## 2023-08-12 DIAGNOSIS — F4310 Post-traumatic stress disorder, unspecified: Secondary | ICD-10-CM

## 2023-08-12 NOTE — Progress Notes (Signed)
Paige [name] [id number] 1966/10/27 57 y.o.  Virtual Visit via Video Note  I connected with pt @ on 08/12/23 at 10:20 AM EDT by a video enabled telemedicine application and verified that I am speaking with the correct person using two identifiers.   I discussed the limitations of evaluation and management by telemedicine and the availability of in person appointments. The patient expressed understanding and agreed to proceed.  I discussed the assessment and treatment plan with the patient. The patient was provided an opportunity to ask questions and all were answered. The patient agreed with the plan and demonstrated an understanding of the instructions.   The patient was advised to call back or seek an in-person evaluation if the symptoms worsen or if the condition fails to improve as anticipated.  I provided 25 minutes of non-face-to-face time during this encounter.  The patient was located at home.  The provider was located at [facility].   Dorothyann Gibbs, NP   Subjective:   Patient ID:  Paige [name] is a 57 y.o. (DOB Oct 22, 1966) female.  Chief Complaint: No chief complaint on file.   HPI KAMERYN HERTZLER presents for follow-up of GAD, MDD, PTSD and insomnia.  Describes mood today as "ok". Pleasant. Reports tearfulness. Mood symptoms - reports depression, anxiety and irritability. Denies panic attacks. Reports worry, rumination and over thinking. Mood is lower.. Stating "I'm not feeling good". Reports forgetting to take her medications for 2 days. Reports some situational stressors - friend. Feels like medications are helpful. Continues to see therapist Optician, dispensing).  Energy levels lower. Active, water aerobics. Enjoys some usual interests and activities. In a relationship. Lives with partner, his 2 cats, her cat "[name]" - and 3 outdoor cats - "[name]". Mother local and supportive. Attends church. Appetite adequate - eating more  carbs. Weight fluctuates - 246 pounds. Sleeps better some nights than others. Averages 6 hours of broken sleep. Recently diagnosed with sleep apnea - adjusting to CPAP machine. Denies daytime napping. Focus and concentration "terrible" generally. Completing tasks around the house. Disabled since 2009. Denies SI or HI.  Denies AH or VH. Denies self harm. Denies substance use.  Working with P/T.  Previous medication trials: Lexapro, Paxil, Trazadone, Wellbutrin SR, Geodon, Seroquel, Risperdal, Depakote, Topamax, Lithium, Provigil, Ambien, Cytomel, Clonazepam and other benzodixepaines, Elavil, Effexor, Remeron, Prozac, Anafranil, Tofranil, Zoloft, Geodon, Buspar, Adderall, Ritalin, Abilify, Rexulti, Lamictal, Latuda   Review of Systems:  Review of Systems  Musculoskeletal:  Negative for gait problem.  Neurological:  Negative for tremors.  Psychiatric/Behavioral:         Please refer to HPI    Medications: I have reviewed the patient's current medications.  Current Outpatient Medications  Medication Sig Dispense Refill   buPROPion (WELLBUTRIN XL) 150 MG 24 hr tablet TAKE 3 TABLETS EVERY MORNING 270 tablet 3   cariprazine (VRAYLAR) 3 MG capsule Take 1 capsule (3 mg total) by mouth daily. 90 capsule 3   cetirizine (ZYRTEC) 10 MG tablet Take by mouth.     clonazePAM (KLONOPIN) 0.5 MG tablet Take 1 tablet (0.5 mg total) by mouth 2 (two) times daily. 60 tablet 2   dicyclomine (BENTYL) 10 MG capsule Take 1 capsule (10 mg total) by mouth 3 (three) times daily as needed for spasms. 90 capsule 11   DULoxetine (CYMBALTA) 60 MG capsule TAKE 1 CAPSULE TWICE DAILY 180 capsule 3   Erenumab-aooe (AIMOVIG) 70 MG/ML SOAJ INJECT 70 MG INTO THE SKIN EVERY 28 DAYS. 1 mL  11   Fe Fum-Fe Poly-Vit C-Lactobac (FUSION) 65-65-25-30 MG CAPS Take by mouth.     ipratropium (ATROVENT) 0.06 % nasal spray      levothyroxine (SYNTHROID, LEVOTHROID) 88 MCG tablet Take 88 mcg by mouth daily before breakfast.      liothyronine (CYTOMEL) 5 MCG tablet Take 5 mcg by mouth daily. Monday Wednesday Friday     nystatin cream (MYCOSTATIN) Apply 1 application  topically daily as needed for dry skin.     omeprazole (PRILOSEC) 20 MG capsule      ondansetron (ZOFRAN-ODT) 4 MG disintegrating tablet Take 1 tablet (4 mg total) by mouth every 8 (eight) hours as needed for nausea or vomiting. 20 tablet 6   Oxycodone HCl 10 MG TABS Take 10 mg by mouth every 6 (six) hours as needed.     Polyethyl Glycol-Propyl Glycol 0.4-0.3 % SOLN Apply 1 drop to eye as needed.     polyethylene glycol (MIRALAX / GLYCOLAX) 17 g packet Take 17 g by mouth 2 (two) times daily.     potassium chloride (K-DUR,KLOR-CON) 10 MEQ tablet Take 1 tablet by mouth daily.     progesterone (PROMETRIUM) 200 MG capsule Take 200 mg by mouth at bedtime.     promethazine (PHENERGAN) 12.5 MG tablet Take 1 tablet (12.5 mg total) by mouth every 8 (eight) hours as needed for nausea or vomiting. 30 tablet 0   rOPINIRole (REQUIP) 2 MG tablet Take 2 mg by mouth 2 (two) times daily.     terconazole (TERAZOL 3) 0.8 % vaginal cream Place 1 applicator vaginally at bedtime.     tiZANidine (ZANAFLEX) 2 MG tablet Take 2 mg by mouth 3 (three) times daily.     torsemide (DEMADEX) 20 MG tablet Take 20 mg by mouth daily.      traZODone (DESYREL) 100 MG tablet Take 2 tablets (200 mg total) by mouth at bedtime. 180 tablet 3   Ubrogepant (UBRELVY) 100 MG TABS Take 1 tablet by mouth daily as needed (at onset of headaches may repeat dose after 2 hours as needed max 200 mg in 24 hours). 16 tablet 11   Current Facility-Administered Medications  Medication Dose Route Frequency Provider Last Rate Last Admin   0.9 %  sodium chloride infusion  500 mL Intravenous Once Meryl Dare, MD        Medication Side Effects: None  Allergies:  Allergies  Allergen Reactions   Cyclobenzaprine Other (See Comments)    Other reaction(s): Unknown Hallucinations Unknown    Meloxicam Other (See  Comments)   Naltrexone Other (See Comments)    SEVERE PAIN ALL OVER   Pork (Porcine) Protein Other (See Comments)    Other reaction(s): Other (See Comments) Migraines Other reaction(s): Other (See Comments) Migraines   Sumatriptan Other (See Comments)    Difficulty breathing Other reaction(s): Other (see comments) Off balance    Tape Dermatitis   Flexeril [Cyclobenzaprine Hcl]    Imitrex [Sumatriptan Base]    Other    Sulfa Antibiotics     Pt states she does not think she is allergic to Sulfa     Past Medical History:  Diagnosis Date   Allergic rhinitis    Allergy    Anemia    Anxiety disorder    Cancer (HCC)    skin   Chronic fatigue    Chronic headaches    Chronic pain    Depression    Endometriosis    Family history of colon cancer    mother,uncles,aunts  Fibromyalgia    Gallstones    GERD (gastroesophageal reflux disease)    Hypothyroidism    IBS (irritable bowel syndrome)    Insomnia    Iron deficiency    Laryngopharyngeal reflux (LPR)    Obesity    Personal history of colonic polyps 04/2006   polypoid   PONV (postoperative nausea and vomiting)    RLS (restless legs syndrome)    Sleep apnea    no cpap    Family History  Problem Relation Age of Onset   Colon cancer Mother        uncles,aunts   Diabetes Father        paternal grandmother,sister   Leukemia Father    Lymphoma Father    Skin cancer Father    Heart disease Father    Colon polyps Sister    Diabetes Sister    Colon cancer Maternal Aunt    Colon cancer Maternal Uncle    Colon polyps Cousin    Stomach cancer Neg Hx    Esophageal cancer Neg Hx    Rectal cancer Neg Hx     Social History   Socioeconomic History   Marital status: Single    Spouse name: Not on file   Number of children: 0   Years of education: Not on file   Highest education level: Not on file  Occupational History   Occupation: RN/disabled  Tobacco Use   Smoking status: Never   Smokeless tobacco: Never   Vaping Use   Vaping status: Never Used  Substance and Sexual Activity   Alcohol use: Not Currently    Alcohol/week: 0.0 standard drinks of alcohol    Comment: rarely   Drug use: No   Sexual activity: Not on file  Other Topics Concern   Not on file  Social History Narrative   Patient is left-handed. She lives alone in a one level home. She has 3 cats. She is unable to exercise. Caffeine one soda / day. On disability now. 2 bachelor degrees   Social Determinants of Health   Financial Resource Strain: Not on file  Food Insecurity: Not on file  Transportation Needs: Not on file  Physical Activity: Not on file  Stress: Not on file  Social Connections: Not on file  Intimate Partner Violence: Not on file    Past Medical History, Surgical history, Social history, and Family history were reviewed and updated as appropriate.   Please see review of systems for further details on the patient's review from today.   Objective:   Physical Exam:  There were no vitals taken for this visit.  Physical Exam Constitutional:      General: She is not in acute distress. Musculoskeletal:        General: No deformity.  Neurological:     Mental Status: She is alert and oriented to person, place, and time.     Coordination: Coordination normal.  Psychiatric:        Attention and Perception: Attention and perception normal. She does not perceive auditory or visual hallucinations.        Mood and Affect: Affect is not labile, blunt, angry or inappropriate.        Speech: Speech normal.        Behavior: Behavior normal.        Thought Content: Thought content normal. Thought content is not paranoid or delusional. Thought content does not include homicidal or suicidal ideation. Thought content does not include homicidal or suicidal plan.  Cognition and Memory: Cognition and memory normal.        Judgment: Judgment normal.     Comments: Insight intact     Lab Review:     Component Value  Date/Time   NA 140 05/26/2019 1046   K 3.4 (L) 05/26/2019 1046   CL 95 (L) 05/26/2019 1046   CO2 37 (H) 05/26/2019 1046   GLUCOSE 97 05/26/2019 1046   BUN 17 05/26/2019 1046   CREATININE 1.09 05/26/2019 1046   CALCIUM 9.3 05/26/2019 1046   PROT 6.9 05/26/2019 1046   ALBUMIN 4.2 05/26/2019 1046   AST 36 05/26/2019 1046   ALT 30 05/26/2019 1046   ALKPHOS 98 05/26/2019 1046   BILITOT 0.4 05/26/2019 1046       Component Value Date/Time   WBC 6.9 05/26/2019 1046   RBC 4.42 05/26/2019 1046   HGB 12.6 05/26/2019 1046   HCT 38.7 05/26/2019 1046   PLT 209.0 05/26/2019 1046   MCV 87.6 05/26/2019 1046   MCHC 32.5 05/26/2019 1046   RDW 14.6 05/26/2019 1046   LYMPHSABS 2.0 05/26/2019 1046   MONOABS 0.6 05/26/2019 1046   EOSABS 0.1 05/26/2019 1046   BASOSABS 0.0 05/26/2019 1046    No results found for: "POCLITH", "LITHIUM"   No results found for: "PHENYTOIN", "PHENOBARB", "VALPROATE", "CBMZ"   .res Assessment: Plan:    Plan:  1. Wellbutrin XL 450mg  daily - in the am  2. Cymbalta 60mg  BID 3. Trazadone 100mg  - 2 at hs  4. Vraylar 3mg  daily though patient assistance.  5. Clonazepam 0.5mg  BID - taking at bedtime  Continue therapy with Bambi Cottle.   RTC 6 weeks  Patient advised to contact office with any questions, adverse effects, or acute worsening in signs and symptoms.  Discussed potential metabolic side effects associated with atypical antipsychotics, as well as potential risk for movement side effects. Advised pt to contact office if movement side effects occur.   Discussed potential benefits, risk, and side effects of benzodiazepines to include potential risk of tolerance and dependence, as well as possible drowsiness. Advised patient not to drive if experiencing drowsiness and to take lowest possible effective dose to minimize risk of dependence and tolerance.    There are no diagnoses linked to this encounter.   Please see After Visit Summary for patient specific  instructions.  Future Appointments  Date Time Provider Department Center  08/13/2023  2:00 PM Cottle, Lynnell Dike, LCSW [facility] None  09/10/2023  9:30 AM Drema Dallas, DO [facility] None  09/17/2023  2:00 PM Cottle, Bambi G, LCSW [facility] None    No orders of the defined types were placed in this encounter.     -------------------------------

## 2023-08-13 ENCOUNTER — Ambulatory Visit (INDEPENDENT_AMBULATORY_CARE_PROVIDER_SITE_OTHER): Payer: Medicare HMO | Admitting: Psychology

## 2023-08-13 DIAGNOSIS — F411 Generalized anxiety disorder: Secondary | ICD-10-CM | POA: Diagnosis not present

## 2023-08-13 DIAGNOSIS — F4310 Post-traumatic stress disorder, unspecified: Secondary | ICD-10-CM | POA: Diagnosis not present

## 2023-08-13 DIAGNOSIS — F3341 Major depressive disorder, recurrent, in partial remission: Secondary | ICD-10-CM | POA: Diagnosis not present

## 2023-08-13 NOTE — Progress Notes (Signed)
[facility] Counselor/Therapist Progress Note  Patient ID: Paige [name], MRN: [id number],    Date: 08/13/2023  Time Spent: 52 minutes  Time in: 2:00  Time out:2:52  Treatment Type: Individual Therapy  Reported Symptoms: depression and worry  Mental Status Exam: Appearance:  Casual     Behavior: Appropriate  Motor: Normal  Speech/Language:  Normal Rate  Affect: blunted  Mood: pleasant  Thought process: normal  Thought content:   WNL  Sensory/Perceptual disturbances:   WNL  Orientation: oriented to person, place, time/date, and situation  Attention: Good  Concentration: Good  Memory: WNL  Fund of knowledge:  Good  Insight:   Good  Judgment:  Fair  Impulse Control: Fair   Risk Assessment: Danger to Self:  No Self-injurious Behavior: No Danger to Others: No Duty to Warn:no Physical Aggression / Violence:No  Access to Firearms a concern: No  Gang Involvement:No   Subjective: The patient attended a face-to-face individual therapy session via video visit.  The patient gave verbal consent for this session to be on video on caregility and patient is aware of the limitations of telehealth..  The patient was in her home alone and therapist was in the office alone.  The patient presents with a blunted affect and mood was pleasant.  The patient presents again as very sleepy today.  She did go to her doctor and had some testing done and all of her labs came back okay.  Her doctor is saying that maybe she has narcolepsy.  She has been sent for another sleep study and we talked about how her not getting good amounts of sleep with her sleep apnea machine could be what is causing her sleepiness during the day.  I encouraged her to follow up on this as she has a tendency to let things just sit and not do anything about it to advocate for herself.  She reports that things are going well otherwise with [name] and they are doing well as a couple.   Interventions: Cognitive  Behavioral Therapy  Diagnosis:PTSD (post-traumatic stress disorder)  Generalized anxiety disorder  Recurrent major depressive disorder, in partial remission (HCC)  Plan: Treatment Plan  Strengths/Abilities:  Intelligent, kind, motivated  Treatment Preferences:  Outpatient Individual therapy  Statement of Needs:  "I need some help with getting back out into the world and learn how to interact and be social"   Symptoms:  Describes a reliving of the event, particularly through dissociative flashbacks.:  (Status: improved). Displays a significant decline in interest and engagement in activities.: (Status: improved). Displays significant psychological and/or physiological  distress resulting from internal and external clues that are reminiscent of the traumatic event.:  (Status: improved). Experiences disturbances in sleep.:   (Status: improved). Experiences disturbing and persistent thoughts, images, and/or perceptions of the  traumatic event.: (Status: improved). Experiences frequent nightmares.:  (Status: improved). Has been exposed to a traumatic event involving actual or  perceived threat of death or serious injury.: (Status: maintained).  Hypervigilance (e.g., feeling constantly on edge, experiencing concentration difficulties, having trouble falling or staying asleep, exhibiting a general state of irritability).: (Status:  improved). Impairment in social, occupational, or other areas of functioning.:  (Status: improved). Intentionally avoids activities, places, people, or objects (e.g., up-armored vehicles) that evoke memories of the event.:  (Status: improved). Intentionally avoids  thoughts, feelings, or discussions related to the traumatic event.: (Status:  improved). Reports difficulty concentrating as well as feelings of guilt.:   (Status: improved). Reports response of intense fear, helplessness,  or horror to the traumatic event.: (Status: improved). Symptoms present more than  one month.:  (Status: maintained).    Problems Addressed:  Anxiety, Posttraumatic Stress Disorder (PTSD),  Goals:  LTG:  1. Enhance ability to effectively cope with the full variety of life's worries  and anxieties.  80% 2. No longer avoids persons, places, activities, and objects that are  reminiscent of the traumatic event.  80% 3. No longer experiences intrusive event recollections, avoidance of event  reminders, intense arousal, or disinterest in activities or  relationships.  90% 4. Stabilize anxiety level while increasing ability to function on a daily  basis.  90% 5. Thinks about or openly discusses the traumatic event with others  without experiencing psychological or physiological distress.  80%  STG: 1.Identify and engage in pleasant activities on a daily basis..  90% 2.Identify, challenge, and replace biased, fearful self-talk with positive, realistic, and empowering self talk 3.  Participate in Cognitive Therapy to help identify, challenge, and replace biased, negative, and selfdefeating thoughts resulting from the trauma.  80 % 4.  Participate in Eye Movement Desensitization and Reprocessing (EMDR) to reduce emotional distress  related to traumatic thoughts, feelings, and images.  90%  Target Date:  02/17/2024 Frequency: Every three weeks Modality:Individual Therapy Interventions by Therapist:  CBT, EMDR, insight oriented therapy, problem solving  Patient approved treatment plan and is progressing nicely.  Tamaria Dunleavy G Sharanya Templin, LCSW

## 2023-08-14 DIAGNOSIS — M545 Low back pain, unspecified: Secondary | ICD-10-CM | POA: Diagnosis not present

## 2023-08-14 DIAGNOSIS — G4733 Obstructive sleep apnea (adult) (pediatric): Secondary | ICD-10-CM | POA: Diagnosis not present

## 2023-08-14 DIAGNOSIS — E039 Hypothyroidism, unspecified: Secondary | ICD-10-CM | POA: Diagnosis not present

## 2023-08-14 DIAGNOSIS — J309 Allergic rhinitis, unspecified: Secondary | ICD-10-CM | POA: Diagnosis not present

## 2023-08-18 DIAGNOSIS — G4736 Sleep related hypoventilation in conditions classified elsewhere: Secondary | ICD-10-CM | POA: Diagnosis not present

## 2023-08-18 DIAGNOSIS — G4733 Obstructive sleep apnea (adult) (pediatric): Secondary | ICD-10-CM | POA: Diagnosis not present

## 2023-08-18 DIAGNOSIS — R0902 Hypoxemia: Secondary | ICD-10-CM | POA: Diagnosis not present

## 2023-08-20 DIAGNOSIS — Z79899 Other long term (current) drug therapy: Secondary | ICD-10-CM | POA: Diagnosis not present

## 2023-08-20 DIAGNOSIS — G4733 Obstructive sleep apnea (adult) (pediatric): Secondary | ICD-10-CM | POA: Diagnosis not present

## 2023-08-20 DIAGNOSIS — K219 Gastro-esophageal reflux disease without esophagitis: Secondary | ICD-10-CM | POA: Diagnosis not present

## 2023-08-20 DIAGNOSIS — R5382 Chronic fatigue, unspecified: Secondary | ICD-10-CM | POA: Diagnosis not present

## 2023-08-20 DIAGNOSIS — E039 Hypothyroidism, unspecified: Secondary | ICD-10-CM | POA: Diagnosis not present

## 2023-08-20 DIAGNOSIS — Z6839 Body mass index (BMI) 39.0-39.9, adult: Secondary | ICD-10-CM | POA: Diagnosis not present

## 2023-08-21 DIAGNOSIS — D2262 Melanocytic nevi of left upper limb, including shoulder: Secondary | ICD-10-CM | POA: Diagnosis not present

## 2023-08-21 DIAGNOSIS — D235 Other benign neoplasm of skin of trunk: Secondary | ICD-10-CM | POA: Diagnosis not present

## 2023-08-21 DIAGNOSIS — D2261 Melanocytic nevi of right upper limb, including shoulder: Secondary | ICD-10-CM | POA: Diagnosis not present

## 2023-08-21 DIAGNOSIS — D2272 Melanocytic nevi of left lower limb, including hip: Secondary | ICD-10-CM | POA: Diagnosis not present

## 2023-08-21 DIAGNOSIS — Z85828 Personal history of other malignant neoplasm of skin: Secondary | ICD-10-CM | POA: Diagnosis not present

## 2023-08-21 DIAGNOSIS — L820 Inflamed seborrheic keratosis: Secondary | ICD-10-CM | POA: Diagnosis not present

## 2023-08-21 DIAGNOSIS — D224 Melanocytic nevi of scalp and neck: Secondary | ICD-10-CM | POA: Diagnosis not present

## 2023-08-21 DIAGNOSIS — D225 Melanocytic nevi of trunk: Secondary | ICD-10-CM | POA: Diagnosis not present

## 2023-08-21 DIAGNOSIS — L821 Other seborrheic keratosis: Secondary | ICD-10-CM | POA: Diagnosis not present

## 2023-09-02 DIAGNOSIS — Z6838 Body mass index (BMI) 38.0-38.9, adult: Secondary | ICD-10-CM | POA: Diagnosis not present

## 2023-09-02 DIAGNOSIS — E7800 Pure hypercholesterolemia, unspecified: Secondary | ICD-10-CM | POA: Diagnosis not present

## 2023-09-02 DIAGNOSIS — F32A Depression, unspecified: Secondary | ICD-10-CM | POA: Diagnosis not present

## 2023-09-02 DIAGNOSIS — G4733 Obstructive sleep apnea (adult) (pediatric): Secondary | ICD-10-CM | POA: Diagnosis not present

## 2023-09-02 DIAGNOSIS — E039 Hypothyroidism, unspecified: Secondary | ICD-10-CM | POA: Diagnosis not present

## 2023-09-02 DIAGNOSIS — M545 Low back pain, unspecified: Secondary | ICD-10-CM | POA: Diagnosis not present

## 2023-09-09 NOTE — Progress Notes (Unsigned)
NEUROLOGY FOLLOW UP OFFICE NOTE  KIMANH TEMPLEMAN [id number]  Assessment/Plan:   Migraine without aura, without status migrainosus, not intractable Chronic pain syndrome History of cauda equina secondary to lumbar spinal stenosis at L4-5 s/p laminectomy and fusion   Migraine prevention:  Aimovig 70mg  every 28 days.  *** Migraine rescue:  Ubrelvy 100mg  *** Limit use of pain relievers to no more than 2 days out of week to prevent risk of rebound or medication-overuse headache. Keep headache diary Follow up one year     Subjective:  Ashlee Player is a 57 year old right-handed female with hypothyroidism, restless leg syndrome and depression/PTSD who follows up for migraines and neuropathy.   UPDATE: Migraine: Intensity:  moderate Duration:  30-60 minutes Frequency:  1 to 2 days a month (last week prior to injection) Frequency of abortive medication: 1 to 2 times a month   Current NSAIDS: None Current analgesics: None Current triptans: None Current ergotamine: None Current anti-emetic: Zofran ODT 4 mg Current muscle relaxants: Baclofen 10 mg Current anti-anxiolytic: Steele Berg 1 mg Current sleep aide: Trazodone 100 mg Current Antihypertensive medications: None Current Antidepressant medications: Cymbalta 60 mg, Wellbutrin XR 150 mg Current Anticonvulsant medications: Gabapentin 800mg  twice daily Current anti-CGRP: Aimovig 70mg , Ubrelvy 100mg  Current Vitamins/Herbal/Supplements: B complex, C, co-Q10 Current Antihistamines/Decongestants: None Other therapy:  none Other medication:  Requip      HISTORY: Migraine: Onset:  Since teenager.  She has migraines as well as daily headache which have gotten worse over the past 2 years. Location:  Migraines: bi-frontal/retro-orbital/temples; Daily headache:  Bi-frontal/temporal Quality:  Migraine: pounding; Daily headache: non-throbbing Initial intensity:  Migraine: 10/10; Daily headache: 6/10 Aura:  no Prodrome:   no Associated symptoms:  Migraine: nausea, photophobia, phonophobia, osmophobia, sometimes vomiting; Daily headache: sometimes photophobia Initial Duration:  Migraine:  Several hours; Daily headache: constant Initial Frequency:  Migraine: every other week; Daily headache:  constant Triggers:  Certain scents, bright light, pork Relieving factors:  Rest Activity:  Cannot function with migraine   Past NSAIDS:  Ibuprofen, naproxen Past analgesics:  Percocet (hives), morphine (itching) Past abortive triptans:  sumatriptan (difficulty breathing), rizatriptan, eletriptan Past muscle relaxants:  cyclobenzaprine (hallucinations) Past anti-emetic:  phenergan Past antihypertensive medications:  no Past antidepressant medications:  Amitriptyline, possibly venlafaxine Past anticonvulsant medications:  Depakote, topiramate 100mg  twice daily (side effect), zonisamide 50mg , lamotrigine Past anti-CGRP:  Ubrelvy 100mg  (effective but no longer covered by her insurance) Past vitamins/Herbal/Supplements:  no Other past therapy:  Botox, acupuncture   Family history of headache:  Paternal aunt   Chronic Pain/Lumbar spinal stenosis/Cauda equina Syndrome: Since early 2020, she reports numbness and tingling in the toes.  TSH from 05/26/19 was 1.72.  NCV-EMG from 07/15/19 was normal.  Labs from June 2020 demonstrated TSH 1.72.  Labs from October 2020 demonstrated ANA negative; sed rate 19; SPEP/IFE negative for M-spike; B12 401; B6 6.2.  Punch Skin Biopsy performed in November 2020 was negative for small fiber neuropathy but demonstrated some degenerative changes within epidermal nerves which are non-specific and may be normal but also may be predictive of future development of clinical neuropathy.  She had inserted a spina stimulator in the right hip in December 2020.  She subsequently developed cauda equina syndrome with severe pain radiating down both legs with new bilateral lower extremity tingling, saddle anesthesia  and bowel and bladder dysfunction.  CT of lumbar spine showed severe spinal stenosis at L4-5 with bulging disc and progressed anteriolithesis, moderate to severe subarticular stenosis bilaterally, and levoscoliosis  at T12-L1.  MRI unable to be performed due to spinal stimulator, so CT myelogram was performed, showing severe spinal stenosis at L4-5 with effacement of root bilaterally and severe compression of the canal.  She underwent L4-S1 laminectomy TLIF and PSF in July 2021.  She has improved bowel and bladder function.    History of Subjective Memory Deficits: She has noted short term memory problems since around 2017.  She frequently misplaces items around the house.  She does get distracted and will not get around to chores around the house.  She has been late paying bills on three occasions.  One time, she got disoriented driving on a familiar route.  She sometimes has trouble recalling names of familiar people.  She also has word-finding difficulty and problems with spelling.  She saw a speech pathologist who told her she has memory deficits.  Her thyroid panel was within normal range.   B12 level from 04/07/2018 was 560.  She underwent neuropsychological testing on 09/16/2018 which demonstrated deficits in verbal fluency, working memory, and mental flexibility as well as mildly below expectation performances in processing speed, learning and memory for information that is not repeated, and verbal abstract reasoning.  However, visual-spatial skills, basic attention, nonverbal abstract reasoning, confrontation naming, and memory for information that is repeated were well within normal limits.  There was no evidence of cortical dysfunction or early onset Alzheimer's disease.  Her cognitive difficulties were suspected to be related to her severe depression, high level of pain, and insomnia. Her uncle and aunt on her mother's side had dementia.  PAST MEDICAL HISTORY: Past Medical History:  Diagnosis Date    Allergic rhinitis    Allergy    Anemia    Anxiety disorder    Cancer (HCC)    skin   Chronic fatigue    Chronic headaches    Chronic pain    Depression    Endometriosis    Family history of colon cancer    mother,uncles,aunts   Fibromyalgia    Gallstones    GERD (gastroesophageal reflux disease)    Hypothyroidism    IBS (irritable bowel syndrome)    Insomnia    Iron deficiency    Laryngopharyngeal reflux (LPR)    Obesity    Personal history of colonic polyps 04/2006   polypoid   PONV (postoperative nausea and vomiting)    RLS (restless legs syndrome)    Sleep apnea    no cpap    MEDICATIONS: Current Outpatient Medications on File Prior to Visit  Medication Sig Dispense Refill   buPROPion (WELLBUTRIN XL) 150 MG 24 hr tablet TAKE 3 TABLETS EVERY MORNING 270 tablet 3   cariprazine (VRAYLAR) 3 MG capsule Take 1 capsule (3 mg total) by mouth daily. 90 capsule 3   cetirizine (ZYRTEC) 10 MG tablet Take by mouth.     clonazePAM (KLONOPIN) 0.5 MG tablet Take 1 tablet (0.5 mg total) by mouth 2 (two) times daily. 60 tablet 2   dicyclomine (BENTYL) 10 MG capsule Take 1 capsule (10 mg total) by mouth 3 (three) times daily as needed for spasms. 90 capsule 11   DULoxetine (CYMBALTA) 60 MG capsule TAKE 1 CAPSULE TWICE DAILY 180 capsule 3   Erenumab-aooe (AIMOVIG) 70 MG/ML SOAJ INJECT 70 MG INTO THE SKIN EVERY 28 DAYS. 1 mL 11   Fe Fum-Fe Poly-Vit C-Lactobac (FUSION) 65-65-25-30 MG CAPS Take by mouth.     ipratropium (ATROVENT) 0.06 % nasal spray      levothyroxine (SYNTHROID, LEVOTHROID)  88 MCG tablet Take 88 mcg by mouth daily before breakfast.     liothyronine (CYTOMEL) 5 MCG tablet Take 5 mcg by mouth daily. Monday Wednesday Friday     nystatin cream (MYCOSTATIN) Apply 1 application  topically daily as needed for dry skin.     omeprazole (PRILOSEC) 20 MG capsule      ondansetron (ZOFRAN-ODT) 4 MG disintegrating tablet Take 1 tablet (4 mg total) by mouth every 8 (eight) hours as  needed for nausea or vomiting. 20 tablet 6   Oxycodone HCl 10 MG TABS Take 10 mg by mouth every 6 (six) hours as needed.     Polyethyl Glycol-Propyl Glycol 0.4-0.3 % SOLN Apply 1 drop to eye as needed.     polyethylene glycol (MIRALAX / GLYCOLAX) 17 g packet Take 17 g by mouth 2 (two) times daily.     potassium chloride (K-DUR,KLOR-CON) 10 MEQ tablet Take 1 tablet by mouth daily.     progesterone (PROMETRIUM) 200 MG capsule Take 200 mg by mouth at bedtime.     promethazine (PHENERGAN) 12.5 MG tablet Take 1 tablet (12.5 mg total) by mouth every 8 (eight) hours as needed for nausea or vomiting. 30 tablet 0   rOPINIRole (REQUIP) 2 MG tablet Take 2 mg by mouth 2 (two) times daily.     terconazole (TERAZOL 3) 0.8 % vaginal cream Place 1 applicator vaginally at bedtime.     tiZANidine (ZANAFLEX) 2 MG tablet Take 2 mg by mouth 3 (three) times daily.     torsemide (DEMADEX) 20 MG tablet Take 20 mg by mouth daily.      traZODone (DESYREL) 100 MG tablet Take 2 tablets (200 mg total) by mouth at bedtime. 180 tablet 3   Ubrogepant (UBRELVY) 100 MG TABS Take 1 tablet by mouth daily as needed (at onset of headaches may repeat dose after 2 hours as needed max 200 mg in 24 hours). 16 tablet 11   Current Facility-Administered Medications on File Prior to Visit  Medication Dose Route Frequency Provider Last Rate Last Admin   0.9 %  sodium chloride infusion  500 mL Intravenous Once Meryl Dare, MD        ALLERGIES: Allergies  Allergen Reactions   Cyclobenzaprine Other (See Comments)    Other reaction(s): Unknown Hallucinations Unknown    Meloxicam Other (See Comments)   Naltrexone Other (See Comments)    SEVERE PAIN ALL OVER   Pork (Porcine) Protein Other (See Comments)    Other reaction(s): Other (See Comments) Migraines Other reaction(s): Other (See Comments) Migraines   Sumatriptan Other (See Comments)    Difficulty breathing Other reaction(s): Other (see comments) Off balance    Tape  Dermatitis   Flexeril [Cyclobenzaprine Hcl]    Imitrex [Sumatriptan Base]    Other    Sulfa Antibiotics     Pt states she does not think she is allergic to Sulfa     FAMILY HISTORY: Family History  Problem Relation Age of Onset   Colon cancer Mother        uncles,aunts   Diabetes Father        paternal grandmother,sister   Leukemia Father    Lymphoma Father    Skin cancer Father    Heart disease Father    Colon polyps Sister    Diabetes Sister    Colon cancer Maternal Aunt    Colon cancer Maternal Uncle    Colon polyps Cousin    Stomach cancer Neg Hx    Esophageal  cancer Neg Hx    Rectal cancer Neg Hx       Objective:  *** General: No acute distress.  Patient appears well-groomed.   Head:  Normocephalic/atraumatic Eyes:  Fundi examined but not visualized Neck: supple, no paraspinal tenderness, full range of motion Neurological Exam: alert and oriented.  Speech fluent and not dysarthric, language intact.  CN II-XII intact. Bulk and tone normal, muscle strength 5/5 throughout.  Sensation to light touch intact.  Deep tendon reflexes 2+ throughout.  Finger to nose testing intact.  Gait normal, Romberg negative.   Shon Millet, DO  CC: Philemon Kingdom, MD

## 2023-09-10 ENCOUNTER — Encounter: Payer: Self-pay | Admitting: Neurology

## 2023-09-10 ENCOUNTER — Ambulatory Visit: Payer: Medicare HMO | Admitting: Neurology

## 2023-09-10 VITALS — BP 117/74 | HR 101 | Ht 63.0 in | Wt 254.0 lb

## 2023-09-10 DIAGNOSIS — G43009 Migraine without aura, not intractable, without status migrainosus: Secondary | ICD-10-CM

## 2023-09-10 MED ORDER — AIMOVIG 70 MG/ML ~~LOC~~ SOAJ: SUBCUTANEOUS | 11 refills | Status: DC

## 2023-09-10 MED ORDER — UBRELVY 100 MG PO TABS: 1.0000 | ORAL_TABLET | Freq: Every day | ORAL | 11 refills | Status: AC | PRN

## 2023-09-10 MED ORDER — ONDANSETRON 4 MG PO TBDP: 4.0000 mg | ORAL_TABLET | Freq: Three times a day (TID) | ORAL | 11 refills | Status: DC | PRN

## 2023-09-10 NOTE — Patient Instructions (Signed)
Aimovig refilled Ubrelvy and Zofran refilled

## 2023-09-13 DIAGNOSIS — H524 Presbyopia: Secondary | ICD-10-CM | POA: Diagnosis not present

## 2023-09-17 ENCOUNTER — Ambulatory Visit (INDEPENDENT_AMBULATORY_CARE_PROVIDER_SITE_OTHER): Payer: Medicare HMO | Admitting: Psychology

## 2023-09-17 DIAGNOSIS — F3341 Major depressive disorder, recurrent, in partial remission: Secondary | ICD-10-CM | POA: Diagnosis not present

## 2023-09-17 DIAGNOSIS — F411 Generalized anxiety disorder: Secondary | ICD-10-CM | POA: Diagnosis not present

## 2023-09-17 DIAGNOSIS — F4310 Post-traumatic stress disorder, unspecified: Secondary | ICD-10-CM

## 2023-09-17 NOTE — Progress Notes (Unsigned)
                Elfego Giammarino G Rahul Malinak, LCSW

## 2023-09-23 ENCOUNTER — Encounter: Payer: Self-pay | Admitting: Adult Health

## 2023-09-23 ENCOUNTER — Telehealth: Payer: Medicare HMO | Admitting: Adult Health

## 2023-09-23 DIAGNOSIS — F331 Major depressive disorder, recurrent, moderate: Secondary | ICD-10-CM

## 2023-09-23 DIAGNOSIS — F4310 Post-traumatic stress disorder, unspecified: Secondary | ICD-10-CM

## 2023-09-23 DIAGNOSIS — F411 Generalized anxiety disorder: Secondary | ICD-10-CM | POA: Diagnosis not present

## 2023-09-23 DIAGNOSIS — G4700 Insomnia, unspecified: Secondary | ICD-10-CM | POA: Diagnosis not present

## 2023-09-23 NOTE — Progress Notes (Signed)
Paige [name] [id number] 03-09-1966 57 y.o.  Virtual Visit via Video Note  I connected with pt @ on 09/23/23 at  9:40 AM EDT by a video enabled telemedicine application and verified that I am speaking with the correct person using two identifiers.   I discussed the limitations of evaluation and management by telemedicine and the availability of in person appointments. The patient expressed understanding and agreed to proceed.  I discussed the assessment and treatment plan with the patient. The patient was provided an opportunity to ask questions and all were answered. The patient agreed with the plan and demonstrated an understanding of the instructions.   The patient was advised to call back or seek an in-person evaluation if the symptoms worsen or if the condition fails to improve as anticipated.  I provided 25 minutes of non-face-to-face time during this encounter.  The patient was located at home.  The provider was located at [facility].   Dorothyann Gibbs, NP   Subjective:   Patient ID:  Paige [name] is a 57 y.o. (DOB June 19, 1966) female.  Chief Complaint: No chief complaint on file.   HPI Paige [name] presents for follow-up of GAD, MDD, PTSD and insomnia.  Describes mood today as "ok". Pleasant. Reports tearfulness. Mood symptoms - reports depression, anxiety and irritability. Denies panic attacks. Reports worry, rumination and over thinking. Reports some situational stressors. Mood is lower. Stating "I don't feel like I'm doing too good". Reports taking medications as prescribed, but is willing to consider other options. Continues to see therapist Optician, dispensing).  Energy levels lower. Active, does not have a regular exercise routine. Enjoys some usual interests and activities. Lives with partner, his 2 cats, her cat "[name]" - and 3 outdoor cats - "[name]". Mother local and supportive. Attends church. Went to the [employer]. Appetite adequate. Weight gain - 249 - 68.5 pounds. Sleep has improved. Averages 6.5 hours. Recently diagnosed with sleep apnea - adjusting to BiPAP machine. Denies daytime napping. Focus and concentration "not the best". Completing tasks around the house. Working - play. Disabled since 2009. Denies SI or HI.  Denies AH or VH. Denies self harm. Denies substance use.  Working with P/T.  Previous medication trials: Lexapro, Paxil, Trazadone, Wellbutrin SR, Geodon, Seroquel, Risperdal, Depakote, Topamax, Lithium, Provigil, Ambien, Cytomel, Clonazepam and other benzodiazapines, Elavil, Effexor, Remeron, Prozac, Anafranil, Tofranil, Zoloft, Geodon, Buspar, Adderall, Ritalin, Abilify, Rexulti, Lamictal, Latuda     Review of Systems:  Review of Systems  Musculoskeletal:  Negative for gait problem.  Neurological:  Negative for tremors.  Psychiatric/Behavioral:         Please refer to HPI    Medications: I have reviewed the patient's current medications.  Current Outpatient Medications  Medication Sig Dispense Refill   buPROPion (WELLBUTRIN XL) 150 MG 24 hr tablet TAKE 3 TABLETS EVERY MORNING 270 tablet 3   cariprazine (VRAYLAR) 3 MG capsule Take 1 capsule (3 mg total) by mouth daily. 90 capsule 3   clonazePAM (KLONOPIN) 0.5 MG tablet Take 1 tablet (0.5 mg total) by mouth 2 (two) times daily. 60 tablet 2   dicyclomine (BENTYL) 10 MG capsule Take 1 capsule (10 mg total) by mouth 3 (three) times daily as needed for spasms. 90 capsule 11   DULoxetine (CYMBALTA) 60 MG capsule TAKE 1 CAPSULE TWICE DAILY 180 capsule 3   Erenumab-aooe (AIMOVIG) 70 MG/ML SOAJ INJECT 70 MG INTO THE SKIN EVERY 28 DAYS. 1 mL 11   Fe Fum-Fe Poly-Vit  C-Lactobac (FUSION) 65-65-25-30 MG CAPS Take by mouth.     ipratropium (ATROVENT) 0.06 % nasal spray      levothyroxine (SYNTHROID, LEVOTHROID) 88 MCG tablet Take 88 mcg by mouth daily before breakfast.     liothyronine (CYTOMEL) 5 MCG tablet Take 5 mcg by mouth  daily. Monday Wednesday Friday     methylphenidate (RITALIN) 10 MG tablet Take 10 mg by mouth 2 (two) times daily.     nystatin cream (MYCOSTATIN) Apply 1 application  topically daily as needed for dry skin.     omeprazole (PRILOSEC) 20 MG capsule      ondansetron (ZOFRAN-ODT) 4 MG disintegrating tablet Take 1 tablet (4 mg total) by mouth every 8 (eight) hours as needed for nausea or vomiting. 20 tablet 11   Oxycodone HCl 10 MG TABS Take 10 mg by mouth every 6 (six) hours as needed.     Polyethyl Glycol-Propyl Glycol 0.4-0.3 % SOLN Apply 1 drop to eye as needed.     polyethylene glycol (MIRALAX / GLYCOLAX) 17 g packet Take 17 g by mouth 2 (two) times daily.     potassium chloride (K-DUR,KLOR-CON) 10 MEQ tablet Take 1 tablet by mouth daily.     promethazine (PHENERGAN) 12.5 MG tablet Take 1 tablet (12.5 mg total) by mouth every 8 (eight) hours as needed for nausea or vomiting. 30 tablet 0   rOPINIRole (REQUIP) 2 MG tablet Take 2 mg by mouth 2 (two) times daily.     terconazole (TERAZOL 3) 0.8 % vaginal cream Place 1 applicator vaginally at bedtime.     tiZANidine (ZANAFLEX) 2 MG tablet Take 2 mg by mouth 3 (three) times daily.     torsemide (DEMADEX) 20 MG tablet Take 20 mg by mouth 2 (two) times daily.     traZODone (DESYREL) 100 MG tablet Take 2 tablets (200 mg total) by mouth at bedtime. 180 tablet 3   Ubrogepant (UBRELVY) 100 MG TABS Take 1 tablet (100 mg total) by mouth daily as needed (at onset of headaches may repeat dose after 2 hours as needed max 200 mg in 24 hours). 16 tablet 11   Current Facility-Administered Medications  Medication Dose Route Frequency Provider Last Rate Last Admin   0.9 %  sodium chloride infusion  500 mL Intravenous Once Meryl Dare, MD        Medication Side Effects: None  Allergies:  Allergies  Allergen Reactions   Cyclobenzaprine Other (See Comments)    Other reaction(s): Unknown Hallucinations Unknown    Meloxicam Other (See Comments)    Naltrexone Other (See Comments)    SEVERE PAIN ALL OVER   Pork (Porcine) Protein Other (See Comments)    Other reaction(s): Other (See Comments) Migraines Other reaction(s): Other (See Comments) Migraines   Sumatriptan Other (See Comments)    Difficulty breathing Other reaction(s): Other (see comments) Off balance    Tape Dermatitis   Flexeril [Cyclobenzaprine Hcl]    Imitrex [Sumatriptan Base]    Other    Sulfa Antibiotics     Pt states she does not think she is allergic to Sulfa     Past Medical History:  Diagnosis Date   Allergic rhinitis    Allergy    Anemia    Anxiety disorder    Cancer (HCC)    skin   Chronic fatigue    Chronic headaches    Chronic pain    Depression    Endometriosis    Family history of colon cancer  mother,uncles,aunts   Fibromyalgia    Gallstones    GERD (gastroesophageal reflux disease)    Hypothyroidism    IBS (irritable bowel syndrome)    Insomnia    Iron deficiency    Laryngopharyngeal reflux (LPR)    Obesity    Personal history of colonic polyps 04/2006   polypoid   PONV (postoperative nausea and vomiting)    RLS (restless legs syndrome)    Sleep apnea    no cpap    Family History  Problem Relation Age of Onset   Colon cancer Mother        uncles,aunts   Diabetes Father        paternal grandmother,sister   Leukemia Father    Lymphoma Father    Skin cancer Father    Heart disease Father    Colon polyps Sister    Diabetes Sister    Colon cancer Maternal Aunt    Colon cancer Maternal Uncle    Colon polyps Cousin    Stomach cancer Neg Hx    Esophageal cancer Neg Hx    Rectal cancer Neg Hx     Social History   Socioeconomic History   Marital status: Single    Spouse name: Not on file   Number of children: 0   Years of education: Not on file   Highest education level: Not on file  Occupational History   Occupation: RN/disabled  Tobacco Use   Smoking status: Never   Smokeless tobacco: Never  Vaping Use    Vaping status: Never Used  Substance and Sexual Activity   Alcohol use: Not Currently    Alcohol/week: 0.0 standard drinks of alcohol    Comment: rarely   Drug use: No   Sexual activity: Not on file  Other Topics Concern   Not on file  Social History Narrative   Patient is left-handed. She lives alone in a one level home. She has 3 cats. She is unable to exercise. Caffeine one soda / day. On disability now. 2 bachelor degrees   Social Determinants of Health   Financial Resource Strain: Not on file  Food Insecurity: Not on file  Transportation Needs: Not on file  Physical Activity: Not on file  Stress: Not on file  Social Connections: Not on file  Intimate Partner Violence: Not on file    Past Medical History, Surgical history, Social history, and Family history were reviewed and updated as appropriate.   Please see review of systems for further details on the patient's review from today.   Objective:   Physical Exam:  There were no vitals taken for this visit.  Physical Exam Constitutional:      General: She is not in acute distress. Musculoskeletal:        General: No deformity.  Neurological:     Mental Status: She is alert and oriented to person, place, and time.     Coordination: Coordination normal.  Psychiatric:        Attention and Perception: Attention and perception normal. She does not perceive auditory or visual hallucinations.        Mood and Affect: Affect is not labile, blunt, angry or inappropriate.        Speech: Speech normal.        Behavior: Behavior normal.        Thought Content: Thought content normal. Thought content is not paranoid or delusional. Thought content does not include homicidal or suicidal ideation. Thought content does not include homicidal or suicidal  plan.        Cognition and Memory: Cognition and memory normal.        Judgment: Judgment normal.     Comments: Insight intact     Lab Review:     Component Value Date/Time   NA  140 05/26/2019 1046   K 3.4 (L) 05/26/2019 1046   CL 95 (L) 05/26/2019 1046   CO2 37 (H) 05/26/2019 1046   GLUCOSE 97 05/26/2019 1046   BUN 17 05/26/2019 1046   CREATININE 1.09 05/26/2019 1046   CALCIUM 9.3 05/26/2019 1046   PROT 6.9 05/26/2019 1046   ALBUMIN 4.2 05/26/2019 1046   AST 36 05/26/2019 1046   ALT 30 05/26/2019 1046   ALKPHOS 98 05/26/2019 1046   BILITOT 0.4 05/26/2019 1046       Component Value Date/Time   WBC 6.9 05/26/2019 1046   RBC 4.42 05/26/2019 1046   HGB 12.6 05/26/2019 1046   HCT 38.7 05/26/2019 1046   PLT 209.0 05/26/2019 1046   MCV 87.6 05/26/2019 1046   MCHC 32.5 05/26/2019 1046   RDW 14.6 05/26/2019 1046   LYMPHSABS 2.0 05/26/2019 1046   MONOABS 0.6 05/26/2019 1046   EOSABS 0.1 05/26/2019 1046   BASOSABS 0.0 05/26/2019 1046    No results found for: "POCLITH", "LITHIUM"   No results found for: "PHENYTOIN", "PHENOBARB", "VALPROATE", "CBMZ"   .res Assessment: Plan:    Plan:  1. Wellbutrin XL 450mg  daily - in the am  2. Cymbalta 60mg  BID 3. Trazadone 100mg  - 2 at hs  4. Vraylar 3mg  daily though patient assistance.  5. Clonazepam 0.5mg  BID - taking at bedtime  Will review previous medications for efficacy and advise patient Continue therapy with Bambi Cottle.   RTC 6 weeks  Patient advised to contact office with any questions, adverse effects, or acute worsening in signs and symptoms.  Discussed potential metabolic side effects associated with atypical antipsychotics, as well as potential risk for movement side effects. Advised pt to contact office if movement side effects occur.   Discussed potential benefits, risk, and side effects of benzodiazepines to include potential risk of tolerance and dependence, as well as possible drowsiness. Advised patient not to drive if experiencing drowsiness and to take lowest possible effective dose to minimize risk of dependence and tolerance.    There are no diagnoses linked to this encounter.    Please see After Visit Summary for patient specific instructions.  Future Appointments  Date Time Provider Department Center  09/23/2023  9:40 AM Hanna Ra, Thereasa Solo, NP [facility] None  10/15/2023  2:00 PM Cottle, Bambi G, LCSW [facility] None  11/12/2023  2:00 PM Cottle, Bambi G, LCSW [facility] None  12/10/2023  2:00 PM Cottle, Bambi G, LCSW [facility] None  01/07/2024  2:00 PM Cottle, Bambi G, LCSW [facility] None  09/13/2024  1:50 PM [name], Adam R, DO [facility] None    No orders of the defined types were placed in this encounter.     -------------------------------

## 2023-10-10 ENCOUNTER — Other Ambulatory Visit: Payer: Self-pay | Admitting: Adult Health

## 2023-10-10 DIAGNOSIS — F411 Generalized anxiety disorder: Secondary | ICD-10-CM

## 2023-10-10 NOTE — Telephone Encounter (Signed)
LF 10/14, due 11/11

## 2023-10-14 ENCOUNTER — Encounter: Payer: Self-pay | Admitting: Orthopaedic Surgery

## 2023-10-14 ENCOUNTER — Other Ambulatory Visit: Payer: Self-pay | Admitting: Orthopaedic Surgery

## 2023-10-14 DIAGNOSIS — M47816 Spondylosis without myelopathy or radiculopathy, lumbar region: Secondary | ICD-10-CM | POA: Diagnosis not present

## 2023-10-14 DIAGNOSIS — M4326 Fusion of spine, lumbar region: Secondary | ICD-10-CM | POA: Diagnosis not present

## 2023-10-14 DIAGNOSIS — M5106 Intervertebral disc disorders with myelopathy, lumbar region: Secondary | ICD-10-CM

## 2023-10-14 DIAGNOSIS — M5416 Radiculopathy, lumbar region: Secondary | ICD-10-CM

## 2023-10-14 DIAGNOSIS — M791 Myalgia, unspecified site: Secondary | ICD-10-CM | POA: Diagnosis not present

## 2023-10-15 ENCOUNTER — Ambulatory Visit: Payer: Medicare HMO | Admitting: Psychology

## 2023-10-15 DIAGNOSIS — F331 Major depressive disorder, recurrent, moderate: Secondary | ICD-10-CM

## 2023-10-15 DIAGNOSIS — F411 Generalized anxiety disorder: Secondary | ICD-10-CM

## 2023-10-15 DIAGNOSIS — F4310 Post-traumatic stress disorder, unspecified: Secondary | ICD-10-CM | POA: Diagnosis not present

## 2023-10-15 NOTE — Progress Notes (Unsigned)
                Carmina Walle G Rosario Kushner, LCSW

## 2023-10-22 NOTE — Discharge Instructions (Signed)

## 2023-10-23 ENCOUNTER — Ambulatory Visit
Admission: RE | Admit: 2023-10-23 | Discharge: 2023-10-23 | Disposition: A | Discharge status: Home / Self Care | Payer: Medicare HMO | Source: Ambulatory Visit | Attending: Orthopaedic Surgery | Admitting: Orthopaedic Surgery

## 2023-10-23 DIAGNOSIS — M5126 Other intervertebral disc displacement, lumbar region: Secondary | ICD-10-CM | POA: Diagnosis not present

## 2023-10-23 DIAGNOSIS — M5416 Radiculopathy, lumbar region: Secondary | ICD-10-CM

## 2023-10-23 DIAGNOSIS — Z981 Arthrodesis status: Secondary | ICD-10-CM | POA: Diagnosis not present

## 2023-10-23 DIAGNOSIS — M48061 Spinal stenosis, lumbar region without neurogenic claudication: Secondary | ICD-10-CM | POA: Diagnosis not present

## 2023-10-23 DIAGNOSIS — M51362 Other intervertebral disc degeneration, lumbar region with discogenic back pain and lower extremity pain: Secondary | ICD-10-CM | POA: Diagnosis not present

## 2023-10-23 DIAGNOSIS — M5106 Intervertebral disc disorders with myelopathy, lumbar region: Secondary | ICD-10-CM

## 2023-10-23 MED ORDER — IOPAMIDOL (ISOVUE-M 200) INJECTION 41%
20.0000 mL | Freq: Once | INTRAMUSCULAR | Status: AC
  Administered 2023-10-23: 20 mL via INTRATHECAL

## 2023-10-23 MED ORDER — MEPERIDINE HCL 50 MG/ML IJ SOLN
50.0000 mg | Freq: Once | INTRAMUSCULAR | Status: AC | PRN
  Administered 2023-10-23: 50 mg via INTRAMUSCULAR

## 2023-10-23 MED ORDER — DIAZEPAM 5 MG PO TABS
10.0000 mg | ORAL_TABLET | Freq: Once | ORAL | Status: AC
  Administered 2023-10-23: 10 mg via ORAL

## 2023-10-23 MED ORDER — ONDANSETRON HCL 4 MG/2ML IJ SOLN
4.0000 mg | Freq: Once | INTRAMUSCULAR | Status: AC | PRN
Start: 2023-10-23 — End: 2023-10-23
  Administered 2023-10-23: 4 mg via INTRAMUSCULAR

## 2023-10-23 NOTE — Progress Notes (Signed)
Patient states her spinal stimulator is turned off.

## 2023-11-03 DIAGNOSIS — G4733 Obstructive sleep apnea (adult) (pediatric): Secondary | ICD-10-CM | POA: Diagnosis not present

## 2023-11-03 DIAGNOSIS — M545 Low back pain, unspecified: Secondary | ICD-10-CM | POA: Diagnosis not present

## 2023-11-03 DIAGNOSIS — F32A Depression, unspecified: Secondary | ICD-10-CM | POA: Diagnosis not present

## 2023-11-03 DIAGNOSIS — R030 Elevated blood-pressure reading, without diagnosis of hypertension: Secondary | ICD-10-CM | POA: Diagnosis not present

## 2023-11-03 DIAGNOSIS — J309 Allergic rhinitis, unspecified: Secondary | ICD-10-CM | POA: Diagnosis not present

## 2023-11-03 DIAGNOSIS — Z6838 Body mass index (BMI) 38.0-38.9, adult: Secondary | ICD-10-CM | POA: Diagnosis not present

## 2023-11-04 ENCOUNTER — Telehealth: Payer: Medicare HMO | Admitting: Adult Health

## 2023-11-04 ENCOUNTER — Encounter: Payer: Self-pay | Admitting: Adult Health

## 2023-11-04 DIAGNOSIS — G4700 Insomnia, unspecified: Secondary | ICD-10-CM

## 2023-11-04 DIAGNOSIS — F4310 Post-traumatic stress disorder, unspecified: Secondary | ICD-10-CM | POA: Diagnosis not present

## 2023-11-04 DIAGNOSIS — F411 Generalized anxiety disorder: Secondary | ICD-10-CM

## 2023-11-04 DIAGNOSIS — F3341 Major depressive disorder, recurrent, in partial remission: Secondary | ICD-10-CM | POA: Diagnosis not present

## 2023-11-04 NOTE — Progress Notes (Signed)
Paige [name] [id number] 1966/09/01 57 y.o.  Virtual Visit via Video Note  I connected with pt @ on 11/04/23 at  8:40 AM EST by a video enabled telemedicine application and verified that I am speaking with the correct person using two identifiers.   I discussed the limitations of evaluation and management by telemedicine and the availability of in person appointments. The patient expressed understanding and agreed to proceed.  I discussed the assessment and treatment plan with the patient. The patient was provided an opportunity to ask questions and all were answered. The patient agreed with the plan and demonstrated an understanding of the instructions.   The patient was advised to call back or seek an in-person evaluation if the symptoms worsen or if the condition fails to improve as anticipated.  I provided 25 minutes of non-face-to-face time during this encounter.  The patient was located at home.  The provider was located at [facility].   Dorothyann Gibbs, NP   Subjective:   Patient ID:  Paige [name] is a 57 y.o. (DOB 1966-08-18) female.  Chief Complaint: No chief complaint on file.   HPI [name] presents for follow-up of GAD, MDD, PTSD and insomnia.  Describes mood today as "ok". Pleasant. Reports tearfulness. Mood symptoms - reports some depression - missing family. Concerned about her mother - recently lost her cat of 14 years. Reports decreased anxiety and irritability - "not as bad". Denies panic attacks. Reports some worry, rumination and over thinking. Mood is lower. Stating "I'm doing a little better than I was". Reports taking medications as prescribed and feels like they are helpful. Continues to see therapist - Bambi Cottle.  Energy levels a little better. Active, does not have a regular exercise routine. Enjoys some usual interests and activities. Lives with partner, his 2 cats, her cat "[name]" - and 3 outdoor cats - "[name]". Mother local and supportive. Attends church - singing in the choir.  Appetite adequate. Weight stable - 248 - 68.5 pounds. Sleep has improved. Averages 7 to 7.5 hours. Recently diagnosed with sleep apnea - adjusting to BiPAP machine. Denies daytime napping. Focus and concentration "still not great". Completing tasks around the house. Working - play. Disabled since 2009. Denies SI or HI.  Denies AH or VH. Denies self harm. Denies substance use.  Previous medication trials: Lexapro, Paxil, Trazadone, Wellbutrin SR, Geodon, Seroquel, Risperdal, Depakote, Topamax, Lithium, Provigil, Ambien, Cytomel, Clonazepam and other benzodiazapines, Elavil, Effexor, Remeron, Prozac, Anafranil, Tofranil, Zoloft, Geodon, Buspar, Adderall, Ritalin, Abilify, Rexulti, Lamictal, Latuda   Review of Systems:  Review of Systems  Musculoskeletal:  Negative for gait problem.  Neurological:  Negative for tremors.  Psychiatric/Behavioral:         Please refer to HPI    Medications: I have reviewed the patient's current medications.  Current Outpatient Medications  Medication Sig Dispense Refill   buPROPion (WELLBUTRIN XL) 150 MG 24 hr tablet TAKE 3 TABLETS EVERY MORNING 270 tablet 3   cariprazine (VRAYLAR) 3 MG capsule Take 1 capsule (3 mg total) by mouth daily. 90 capsule 3   clonazePAM (KLONOPIN) 0.5 MG tablet TAKE 1 TABLET TWICE DAILY AS NEEDED FOR ANXIETY 60 tablet 2   dicyclomine (BENTYL) 10 MG capsule Take 1 capsule (10 mg total) by mouth 3 (three) times daily as needed for spasms. 90 capsule 11   DULoxetine (CYMBALTA) 60 MG capsule TAKE 1 CAPSULE TWICE DAILY 180 capsule 3   Erenumab-aooe (AIMOVIG) 70 MG/ML SOAJ INJECT 70  MG INTO THE SKIN EVERY 28 DAYS. 1 mL 11   Fe Fum-Fe Poly-Vit C-Lactobac (FUSION) 65-65-25-30 MG CAPS Take by mouth.     ipratropium (ATROVENT) 0.06 % nasal spray      levothyroxine (SYNTHROID, LEVOTHROID) 88 MCG tablet Take 88 mcg by mouth daily before breakfast.      liothyronine (CYTOMEL) 5 MCG tablet Take 5 mcg by mouth daily. Monday Wednesday Friday     methylphenidate (RITALIN) 10 MG tablet Take 10 mg by mouth 2 (two) times daily.     nystatin cream (MYCOSTATIN) Apply 1 application  topically daily as needed for dry skin.     omeprazole (PRILOSEC) 20 MG capsule      ondansetron (ZOFRAN-ODT) 4 MG disintegrating tablet Take 1 tablet (4 mg total) by mouth every 8 (eight) hours as needed for nausea or vomiting. 20 tablet 11   Oxycodone HCl 10 MG TABS Take 10 mg by mouth every 6 (six) hours as needed.     Polyethyl Glycol-Propyl Glycol 0.4-0.3 % SOLN Apply 1 drop to eye as needed.     polyethylene glycol (MIRALAX / GLYCOLAX) 17 g packet Take 17 g by mouth 2 (two) times daily.     potassium chloride (K-DUR,KLOR-CON) 10 MEQ tablet Take 1 tablet by mouth daily.     promethazine (PHENERGAN) 12.5 MG tablet Take 1 tablet (12.5 mg total) by mouth every 8 (eight) hours as needed for nausea or vomiting. 30 tablet 0   rOPINIRole (REQUIP) 2 MG tablet Take 2 mg by mouth 2 (two) times daily.     terconazole (TERAZOL 3) 0.8 % vaginal cream Place 1 applicator vaginally at bedtime.     tiZANidine (ZANAFLEX) 2 MG tablet Take 2 mg by mouth 3 (three) times daily.     torsemide (DEMADEX) 20 MG tablet Take 20 mg by mouth 2 (two) times daily.     traZODone (DESYREL) 100 MG tablet Take 2 tablets (200 mg total) by mouth at bedtime. 180 tablet 3   Ubrogepant (UBRELVY) 100 MG TABS Take 1 tablet (100 mg total) by mouth daily as needed (at onset of headaches may repeat dose after 2 hours as needed max 200 mg in 24 hours). 16 tablet 11   Current Facility-Administered Medications  Medication Dose Route Frequency Provider Last Rate Last Admin   0.9 %  sodium chloride infusion  500 mL Intravenous Once Meryl Dare, MD        Medication Side Effects: None  Allergies:  Allergies  Allergen Reactions   Cyclobenzaprine Other (See Comments)    Other reaction(s):  Unknown Hallucinations Unknown    Meloxicam Other (See Comments)   Naltrexone Other (See Comments)    SEVERE PAIN ALL OVER   Pork (Porcine) Protein Other (See Comments)    Other reaction(s): Other (See Comments) Migraines Other reaction(s): Other (See Comments) Migraines   Sumatriptan Other (See Comments)    Difficulty breathing Other reaction(s): Other (see comments) Off balance    Tape Dermatitis   Flexeril [Cyclobenzaprine Hcl]    Imitrex [Sumatriptan Base]    Other    Sulfa Antibiotics     Pt states she does not think she is allergic to Sulfa     Past Medical History:  Diagnosis Date   Allergic rhinitis    Allergy    Anemia    Anxiety disorder    Cancer (HCC)    skin   Chronic fatigue    Chronic headaches    Chronic pain    Depression  Endometriosis    Family history of colon cancer    mother,uncles,aunts   Fibromyalgia    Gallstones    GERD (gastroesophageal reflux disease)    Hypothyroidism    IBS (irritable bowel syndrome)    Insomnia    Iron deficiency    Laryngopharyngeal reflux (LPR)    Obesity    Personal history of colonic polyps 04/2006   polypoid   PONV (postoperative nausea and vomiting)    RLS (restless legs syndrome)    Sleep apnea    no cpap    Family History  Problem Relation Age of Onset   Colon cancer Mother        uncles,aunts   Diabetes Father        paternal grandmother,sister   Leukemia Father    Lymphoma Father    Skin cancer Father    Heart disease Father    Colon polyps Sister    Diabetes Sister    Colon cancer Maternal Aunt    Colon cancer Maternal Uncle    Colon polyps Cousin    Stomach cancer Neg Hx    Esophageal cancer Neg Hx    Rectal cancer Neg Hx     Social History   Socioeconomic History   Marital status: Single    Spouse name: Not on file   Number of children: 0   Years of education: Not on file   Highest education level: Not on file  Occupational History   Occupation: RN/disabled  Tobacco  Use   Smoking status: Never   Smokeless tobacco: Never  Vaping Use   Vaping status: Never Used  Substance and Sexual Activity   Alcohol use: Not Currently    Alcohol/week: 0.0 standard drinks of alcohol    Comment: rarely   Drug use: No   Sexual activity: Not on file  Other Topics Concern   Not on file  Social History Narrative   Patient is left-handed. She lives alone in a one level home. She has 3 cats. She is unable to exercise. Caffeine one soda / day. On disability now. 2 bachelor degrees   Social Determinants of Health   Financial Resource Strain: Not on file  Food Insecurity: Not on file  Transportation Needs: Not on file  Physical Activity: Not on file  Stress: Not on file  Social Connections: Not on file  Intimate Partner Violence: Not on file    Past Medical History, Surgical history, Social history, and Family history were reviewed and updated as appropriate.   Please see review of systems for further details on the patient's review from today.   Objective:   Physical Exam:  There were no vitals taken for this visit.  Physical Exam  Lab Review:     Component Value Date/Time   NA 140 05/26/2019 1046   K 3.4 (L) 05/26/2019 1046   CL 95 (L) 05/26/2019 1046   CO2 37 (H) 05/26/2019 1046   GLUCOSE 97 05/26/2019 1046   BUN 17 05/26/2019 1046   CREATININE 1.09 05/26/2019 1046   CALCIUM 9.3 05/26/2019 1046   PROT 6.9 05/26/2019 1046   ALBUMIN 4.2 05/26/2019 1046   AST 36 05/26/2019 1046   ALT 30 05/26/2019 1046   ALKPHOS 98 05/26/2019 1046   BILITOT 0.4 05/26/2019 1046       Component Value Date/Time   WBC 6.9 05/26/2019 1046   RBC 4.42 05/26/2019 1046   HGB 12.6 05/26/2019 1046   HCT 38.7 05/26/2019 1046   PLT 209.0 05/26/2019  1046   MCV 87.6 05/26/2019 1046   MCHC 32.5 05/26/2019 1046   RDW 14.6 05/26/2019 1046   LYMPHSABS 2.0 05/26/2019 1046   MONOABS 0.6 05/26/2019 1046   EOSABS 0.1 05/26/2019 1046   BASOSABS 0.0 05/26/2019 1046    No  results found for: "POCLITH", "LITHIUM"   No results found for: "PHENYTOIN", "PHENOBARB", "VALPROATE", "CBMZ"   .res Assessment: Plan:     Plan:  1. Wellbutrin XL 450mg  daily - in the am  2. Cymbalta 60mg  BID 3. Trazadone 100mg  - 2 at hs  4. Vraylar 3mg  daily though patient assistance.  5. Clonazepam 0.5mg  BID - taking at bedtime  Will review previous medications for efficacy and advise patient Continue therapy with Bambi Cottle.   RTC 6 weeks  Patient advised to contact office with any questions, adverse effects, or acute worsening in signs and symptoms.  Discussed potential metabolic side effects associated with atypical antipsychotics, as well as potential risk for movement side effects. Advised pt to contact office if movement side effects occur.   Discussed potential benefits, risk, and side effects of benzodiazepines to include potential risk of tolerance and dependence, as well as possible drowsiness. Advised patient not to drive if experiencing drowsiness and to take lowest possible effective dose to minimize risk of dependence and tolerance.     Diagnoses and all orders for this visit:  Recurrent major depressive disorder, in partial remission (HCC)  Generalized anxiety disorder  PTSD (post-traumatic stress disorder)  Insomnia, unspecified type     Please see After Visit Summary for patient specific instructions.  Future Appointments  Date Time Provider Department Center  11/12/2023  2:00 PM Cottle, Lynnell Dike, LCSW [facility] None  12/10/2023  2:00 PM Cottle, Bambi G, LCSW [facility] None  01/07/2024  2:00 PM Cottle, Bambi G, LCSW [facility] None  09/13/2024  1:50 PM [name], Adam R, DO [facility] None    No orders of the defined types were placed in this encounter.     -------------------------------

## 2023-11-11 ENCOUNTER — Other Ambulatory Visit: Payer: Self-pay | Admitting: Adult Health

## 2023-11-11 DIAGNOSIS — G4700 Insomnia, unspecified: Secondary | ICD-10-CM

## 2023-11-11 DIAGNOSIS — F331 Major depressive disorder, recurrent, moderate: Secondary | ICD-10-CM

## 2023-11-12 ENCOUNTER — Ambulatory Visit: Payer: Medicare HMO | Admitting: Psychology

## 2023-11-12 DIAGNOSIS — F4310 Post-traumatic stress disorder, unspecified: Secondary | ICD-10-CM

## 2023-11-12 DIAGNOSIS — F411 Generalized anxiety disorder: Secondary | ICD-10-CM

## 2023-11-12 DIAGNOSIS — F3341 Major depressive disorder, recurrent, in partial remission: Secondary | ICD-10-CM | POA: Diagnosis not present

## 2023-11-12 NOTE — Progress Notes (Signed)
Behavioral Health Counselor/Therapist Progress Note  Patient ID: Paige [name], MRN: [id number],    Date: 11/12/2023  Time Spent: 51 minutes  Time in: 2:04  Time out:2:55  Treatment Type: Individual Therapy  Reported Symptoms: depression and worry  Mental Status Exam: Appearance:  Casual     Behavior: Appropriate  Motor: Normal  Speech/Language:  Normal Rate  Affect: blunted  Mood: pleasant  Thought process: normal  Thought content:   WNL  Sensory/Perceptual disturbances:   WNL  Orientation: oriented to person, place, time/date, and situation  Attention: Good  Concentration: Good  Memory: WNL  Fund of knowledge:  Good  Insight:   Good  Judgment:  Fair  Impulse Control: Fair   Risk Assessment: Danger to Self:  No Self-injurious Behavior: No Danger to Others: No Duty to Warn:no Physical Aggression / Violence:No  Access to Firearms a concern: No  Gang Involvement:No   Subjective: The patient attended a face-to-face individual therapy session via video visit.  The patient gave verbal consent for this session to be on video on caregility and patient is aware of the limitations of telehealth..  The patient was in her home alone and therapist was in the office alone.  The patient presents with a blunted affect and mood was anxious.  The patient reports that her mother is in the hospital right now.  The patient has had a lot of worry about her mother because she is 64 and has had heart issues and she has been concerned about her health.  I recommended that the patient work with herself on trying not to catastrophize and focus on her mom getting better and coming home.  The patient also reports that she does not feel well today and I recommended that she get up COVID test and check her self before she goes back to the hospital to see her mom.  I encouraged the patient to take care of herself as she has a tendency not to do good things for herself.   Interventions:  Cognitive Behavioral Therapy  Diagnosis:Recurrent major depressive disorder, in partial remission (HCC)  Generalized anxiety disorder  PTSD (post-traumatic stress disorder)  Plan: Treatment Plan  Strengths/Abilities:  Intelligent, kind, motivated  Treatment Preferences:  Outpatient Individual therapy  Statement of Needs:  "I need some help with getting back out into the world and learn how to interact and be social"   Symptoms:  Describes a reliving of the event, particularly through dissociative flashbacks.:  (Status: improved). Displays a significant decline in interest and engagement in activities.: (Status: improved). Displays significant psychological and/or physiological  distress resulting from internal and external clues that are reminiscent of the traumatic event.:  (Status: improved). Experiences disturbances in sleep.:   (Status: improved). Experiences disturbing and persistent thoughts, images, and/or perceptions of the  traumatic event.: (Status: improved). Experiences frequent nightmares.:  (Status: improved). Has been exposed to a traumatic event involving actual or  perceived threat of death or serious injury.: (Status: maintained).  Hypervigilance (e.g., feeling constantly on edge, experiencing concentration difficulties, having trouble falling or staying asleep, exhibiting a general state of irritability).: (Status:  improved). Impairment in social, occupational, or other areas of functioning.:  (Status: improved). Intentionally avoids activities, places, people, or objects (e.g., up-armored vehicles) that evoke memories of the event.:  (Status: improved). Intentionally avoids  thoughts, feelings, or discussions related to the traumatic event.: (Status:  improved). Reports difficulty concentrating as well as feelings of guilt.:   (Status: improved). Reports response of  intense fear, helplessness, or horror to the traumatic event.: (Status: improved). Symptoms present  more than one month.:  (Status: maintained).    Problems Addressed:  Anxiety, Posttraumatic Stress Disorder (PTSD),  Goals:  LTG:  1. Enhance ability to effectively cope with the full variety of life's worries  and anxieties.  90% 2. No longer avoids persons, places, activities, and objects that are  reminiscent of the traumatic event.  90% 3. No longer experiences intrusive event recollections, avoidance of event  reminders, intense arousal, or disinterest in activities or  relationships.  100% 4. Stabilize anxiety level while increasing ability to function on a daily  basis.  90% 5. Thinks about or openly discusses the traumatic event with others  without experiencing psychological or physiological distress.  80%  STG: 1.Identify and engage in pleasant activities on a daily basis..  90% 2.Identify, challenge, and replace biased, fearful self-talk with positive, realistic, and empowering self talk 3.  Participate in Cognitive Therapy to help identify, challenge, and replace biased, negative, and selfdefeating thoughts resulting from the trauma.  80 % 4.  Participate in Eye Movement Desensitization and Reprocessing (EMDR) to reduce emotional distress  related to traumatic thoughts, feelings, and images.  90%  Target Date:  02/17/2024 Frequency: Every three weeks Modality:Individual Therapy Interventions by Therapist:  CBT, EMDR, insight oriented therapy, problem solving  Patient approved treatment plan and is progressing nicely.  Allexis Bordenave G Omelia Marquart, LCSW

## 2023-11-14 DIAGNOSIS — M25552 Pain in left hip: Secondary | ICD-10-CM | POA: Diagnosis not present

## 2023-11-18 ENCOUNTER — Telehealth: Payer: Self-pay | Admitting: Adult Health

## 2023-11-18 ENCOUNTER — Other Ambulatory Visit: Payer: Self-pay

## 2023-11-18 DIAGNOSIS — F39 Unspecified mood [affective] disorder: Secondary | ICD-10-CM

## 2023-11-18 DIAGNOSIS — F331 Major depressive disorder, recurrent, moderate: Secondary | ICD-10-CM

## 2023-11-18 MED ORDER — CARIPRAZINE HCL 3 MG PO CAPS: 3.0000 mg | ORAL_CAPSULE | Freq: Every day | ORAL | 0 refills | Status: DC

## 2023-11-18 NOTE — Telephone Encounter (Signed)
Paige [name] has been re-approved for Pt. Assistance through [employer] for [employer] 12/01/2024

## 2023-11-18 NOTE — Telephone Encounter (Signed)
Vraylar 3mg  to [facility] order pharmacy.

## 2023-11-18 NOTE — Telephone Encounter (Signed)
No. She needs a new one. I will pend it.

## 2023-11-18 NOTE — Telephone Encounter (Signed)
Sent!

## 2023-11-21 DIAGNOSIS — M47816 Spondylosis without myelopathy or radiculopathy, lumbar region: Secondary | ICD-10-CM | POA: Diagnosis not present

## 2023-11-25 DIAGNOSIS — M479 Spondylosis, unspecified: Secondary | ICD-10-CM | POA: Diagnosis not present

## 2023-11-25 DIAGNOSIS — K5901 Slow transit constipation: Secondary | ICD-10-CM | POA: Diagnosis not present

## 2023-11-25 DIAGNOSIS — Z780 Asymptomatic menopausal state: Secondary | ICD-10-CM | POA: Diagnosis not present

## 2023-11-25 DIAGNOSIS — R3911 Hesitancy of micturition: Secondary | ICD-10-CM | POA: Diagnosis not present

## 2023-11-25 DIAGNOSIS — Z1331 Encounter for screening for depression: Secondary | ICD-10-CM | POA: Diagnosis not present

## 2023-11-25 DIAGNOSIS — R5382 Chronic fatigue, unspecified: Secondary | ICD-10-CM | POA: Diagnosis not present

## 2023-11-25 DIAGNOSIS — Z0000 Encounter for general adult medical examination without abnormal findings: Secondary | ICD-10-CM | POA: Diagnosis not present

## 2023-11-25 DIAGNOSIS — Z6839 Body mass index (BMI) 39.0-39.9, adult: Secondary | ICD-10-CM | POA: Diagnosis not present

## 2023-12-02 ENCOUNTER — Other Ambulatory Visit (HOSPITAL_COMMUNITY): Payer: Self-pay

## 2023-12-02 ENCOUNTER — Telehealth: Payer: Self-pay | Admitting: Pharmacy Technician

## 2023-12-02 NOTE — Telephone Encounter (Signed)
 Pharmacy Patient Advocate Encounter   Received notification from CoverMyMeds that prior authorization for Ubrelvy  100mg  is required/requested.   Insurance verification completed.   The patient is insured through [employer] .   Per test claim: PA required and submitted KEY/EOC/Request #: [id number] APPROVED from 12/02/22 to 12/01/24. Ran test claim, Copay is $45. This test claim was processed through [facility]- copay amounts may vary at other pharmacies due to pharmacy/plan contracts, or as the patient moves through the different stages of their insurance plan.

## 2023-12-09 DIAGNOSIS — E039 Hypothyroidism, unspecified: Secondary | ICD-10-CM | POA: Diagnosis not present

## 2023-12-09 DIAGNOSIS — Z79899 Other long term (current) drug therapy: Secondary | ICD-10-CM | POA: Diagnosis not present

## 2023-12-09 DIAGNOSIS — E559 Vitamin D deficiency, unspecified: Secondary | ICD-10-CM | POA: Diagnosis not present

## 2023-12-09 DIAGNOSIS — R5382 Chronic fatigue, unspecified: Secondary | ICD-10-CM | POA: Diagnosis not present

## 2023-12-09 DIAGNOSIS — R300 Dysuria: Secondary | ICD-10-CM | POA: Diagnosis not present

## 2023-12-10 ENCOUNTER — Ambulatory Visit: Payer: Medicare Other | Admitting: Psychology

## 2023-12-10 DIAGNOSIS — F411 Generalized anxiety disorder: Secondary | ICD-10-CM | POA: Diagnosis not present

## 2023-12-10 DIAGNOSIS — F3341 Major depressive disorder, recurrent, in partial remission: Secondary | ICD-10-CM

## 2023-12-10 DIAGNOSIS — F4310 Post-traumatic stress disorder, unspecified: Secondary | ICD-10-CM | POA: Diagnosis not present

## 2023-12-10 NOTE — Progress Notes (Signed)
 [facility] Counselor/Therapist Progress Note  Patient ID: Paige [name], MRN: [id number],    Date: 12/10/2023  Time Spent: 57 minutes  Time in: 2:03  Time out:3:00  Treatment Type: Individual Therapy  Reported Symptoms: depression and worry  Mental Status Exam: Appearance:  Casual     Behavior: Appropriate  Motor: Normal  Speech/Language:  Normal Rate  Affect: blunted  Mood: anxious  Thought process: normal  Thought content:   WNL  Sensory/Perceptual disturbances:   WNL  Orientation: oriented to person, place, time/date, and situation  Attention: Good  Concentration: Good  Memory: WNL  Fund of knowledge:  Good  Insight:   Good  Judgment:  Fair  Impulse Control: Fair   Risk Assessment: Danger to Self:  No Self-injurious Behavior: No Danger to Others: No Duty to Warn:no Physical Aggression / Violence:No  Access to Firearms a concern: No  Gang Involvement:No   Subjective: The patient attended a face-to-face individual therapy session via video visit.  The patient gave verbal consent for this session to be on video on caregility and patient is aware of the limitations of telehealth..  The patient was in her home alone and therapist was in the office alone.  The patient presents with a blunted affect and mood was anxious.  The patient reports it has been a stressful several weeks.  She states that her mother fell and broke her hip on the back patio at her home.  She reports that she has been going up going over and giving her mother a bath.  I explained that her mother may qualify for home health aid if she is home bound.  And that that would take something off of her plate.  The second thing that the patient talked about being very stressful was that her friend, TJ, has been manic and out of control.  I recommended that she detach herself a little from that situation and I explained that she would not be admitted to the hospital and that she was dangerous in  someway and that that does not need to be the patient's worry.  In addition she reports that she and Leonce have taken a job helping a lady bathe and also working to clean up a sweet child.  I suggested that the patient do a few things to get some things off of her plate so that she is not quite as stressed as she was when we started the session.  Interventions: Cognitive Behavioral Therapy  Diagnosis:Recurrent major depressive disorder, in partial remission (HCC)  Generalized anxiety disorder  PTSD (post-traumatic stress disorder)  Plan: Treatment Plan  Strengths/Abilities:  Intelligent, kind, motivated  Treatment Preferences:  Outpatient Individual therapy  Statement of Needs:  I need some help with getting back out into the world and learn how to interact and be social   Symptoms:  Describes a reliving of the event, particularly through dissociative flashbacks.:  (Status: improved). Displays a significant decline in interest and engagement in activities.: (Status: improved). Displays significant psychological and/or physiological  distress resulting from internal and external clues that are reminiscent of the traumatic event.:  (Status: improved). Experiences disturbances in sleep.:   (Status: improved). Experiences disturbing and persistent thoughts, images, and/or perceptions of the  traumatic event.: (Status: improved). Experiences frequent nightmares.:  (Status: improved). Has been exposed to a traumatic event involving actual or  perceived threat of death or serious injury.: (Status: maintained).  Hypervigilance (e.g., feeling constantly on edge, experiencing concentration difficulties, having trouble falling or  staying asleep, exhibiting a general state of irritability).: (Status:  improved). Impairment in social, occupational, or other areas of functioning.:  (Status: improved). Intentionally avoids activities, places, people, or objects (e.g., up-armored vehicles) that evoke  memories of the event.:  (Status: improved). Intentionally avoids  thoughts, feelings, or discussions related to the traumatic event.: (Status:  improved). Reports difficulty concentrating as well as feelings of guilt.:   (Status: improved). Reports response of intense fear, helplessness, or horror to the traumatic event.: (Status: improved). Symptoms present more than one month.:  (Status: maintained).    Problems Addressed:  Anxiety, Posttraumatic Stress Disorder (PTSD),  Goals:  LTG:  1. Enhance ability to effectively cope with the full variety of life's worries  and anxieties.  90% 2. No longer avoids persons, places, activities, and objects that are  reminiscent of the traumatic event.  90% 3. No longer experiences intrusive event recollections, avoidance of event  reminders, intense arousal, or disinterest in activities or  relationships.  100% 4. Stabilize anxiety level while increasing ability to function on a daily  basis.  90% 5. Thinks about or openly discusses the traumatic event with others  without experiencing psychological or physiological distress.  80%  STG: 1.Identify and engage in pleasant activities on a daily basis..  90% 2.Identify, challenge, and replace biased, fearful self-talk with positive, realistic, and empowering self talk 3.  Participate in Cognitive Therapy to help identify, challenge, and replace biased, negative, and selfdefeating thoughts resulting from the trauma.  80 % 4.  Participate in Eye Movement Desensitization and Reprocessing (EMDR) to reduce emotional distress  related to traumatic thoughts, feelings, and images.  90%  Target Date:  02/17/2024 Frequency: Every three weeks Modality:Individual Therapy Interventions by Therapist:  CBT, EMDR, insight oriented therapy, problem solving  Patient approved treatment plan and is progressing nicely.  Yan Pankratz G Armeda Plumb,  LCSW

## 2023-12-16 ENCOUNTER — Telehealth: Payer: Self-pay | Admitting: Adult Health

## 2023-12-16 ENCOUNTER — Other Ambulatory Visit: Payer: Self-pay

## 2023-12-16 ENCOUNTER — Telehealth: Payer: Medicare Other | Admitting: Adult Health

## 2023-12-16 ENCOUNTER — Encounter: Payer: Self-pay | Admitting: Adult Health

## 2023-12-16 DIAGNOSIS — F3341 Major depressive disorder, recurrent, in partial remission: Secondary | ICD-10-CM

## 2023-12-16 DIAGNOSIS — F411 Generalized anxiety disorder: Secondary | ICD-10-CM

## 2023-12-16 DIAGNOSIS — F339 Major depressive disorder, recurrent, unspecified: Secondary | ICD-10-CM

## 2023-12-16 DIAGNOSIS — F4310 Post-traumatic stress disorder, unspecified: Secondary | ICD-10-CM | POA: Diagnosis not present

## 2023-12-16 DIAGNOSIS — G4700 Insomnia, unspecified: Secondary | ICD-10-CM

## 2023-12-16 MED ORDER — CLONAZEPAM 0.5 MG PO TABS
0.5000 mg | ORAL_TABLET | Freq: Two times a day (BID) | ORAL | 2 refills | Status: DC
Start: 2023-12-16 — End: 2024-03-09

## 2023-12-16 MED ORDER — METHYLPHENIDATE HCL 10 MG PO TABS: 10.0000 mg | ORAL_TABLET | Freq: Two times a day (BID) | ORAL | 0 refills | Status: DC

## 2023-12-16 MED ORDER — TRAZODONE HCL 100 MG PO TABS: 200.0000 mg | ORAL_TABLET | Freq: Every day | ORAL | 3 refills | Status: DC

## 2023-12-16 MED ORDER — DULOXETINE HCL 60 MG PO CPEP: 60.0000 mg | ORAL_CAPSULE | Freq: Two times a day (BID) | ORAL | 3 refills | Status: DC

## 2023-12-16 NOTE — Telephone Encounter (Signed)
 Patient lvm at 10:58 after appt with RM stating that her prescription for Methlyphenidate was sent to the incorrect pharmacy. She instead needs it sent to [facility] [address] Marcell Anger Ph: [phone]

## 2023-12-16 NOTE — Telephone Encounter (Signed)
 Canceled Rx at [facility], repended for [facility].

## 2023-12-16 NOTE — Progress Notes (Signed)
 Paige [name] [id number] 11-Aug-1966 58 y.o.  Virtual Visit via Video Note  I connected with pt @ on 12/16/23 at  8:30 AM EST by a video enabled telemedicine application and verified that I am speaking with the correct person using two identifiers.   I discussed the limitations of evaluation and management by telemedicine and the availability of in person appointments. The patient expressed understanding and agreed to proceed.  I discussed the assessment and treatment plan with the patient. The patient was provided an opportunity to ask questions and all were answered. The patient agreed with the plan and demonstrated an understanding of the instructions.   The patient was advised to call back or seek an in-person evaluation if the symptoms worsen or if the condition fails to improve as anticipated.  I provided 25 minutes of non-face-to-face time during this encounter.  The patient was located at home.  The provider was located at [facility].   Angeline LOISE Sayers, NP   Subjective:   Patient ID:  Paige [name] is a 58 y.o. (DOB 1966/08/11) female.  Chief Complaint: No chief complaint on file.   HPI WEI NEWBROUGH presents for follow-up of GAD, MDD, PTSD and insomnia.  Describes mood today as ok. Pleasant. Reports tearfulness. Mood symptoms - reports some depression, anxiety and irritability. Reports mother fell and broke her hip over the holidays. Denies panic attacks. Reports some worry, rumination and over thinking. Reports mood as a little lower. Stating I'm feeling kinda blah. Reports taking medications as prescribed and feels like they are helpful. Continues to see therapist - Bambi Cottle.  Energy levels lower. Active, does not have a regular exercise routine. Enjoys some usual interests and activities. Lives with partner and cats. Mother local and supportive. Attends church. Appetite adequate. Weight stable - 248 pounds - 68.5. Sleeps better some  nights than others - 'it's up and down. Reports an average of 7 to 7.5 hours. Diagnosed with sleep apnea - using BiPAP machine. Denies daytime napping. Focus and concentration difficulties. Completing tasks around the house. Working part time - 6 hours a week. Disabled since 2009. Denies SI or HI.  Denies AH or VH. Denies self harm. Denies substance use.  Previous medication trials: Lexapro, Paxil, Trazadone, Wellbutrin  SR, Geodon, Seroquel, Risperdal, Depakote, Topamax , Lithium, Provigil, Ambien, Cytomel, Clonazepam  and other benzodiazapines, Elavil, Effexor, Remeron, Prozac, Anafranil, Tofranil, Zoloft, Geodon, Buspar, Adderall, Ritalin , Abilify, Rexulti, Lamictal , Latuda   Review of Systems:  Review of Systems  Musculoskeletal:  Negative for gait problem.  Neurological:  Negative for tremors.  Psychiatric/Behavioral:         Please refer to HPI    Medications: I have reviewed the patient's current medications.  Current Outpatient Medications  Medication Sig Dispense Refill   buPROPion  (WELLBUTRIN  XL) 150 MG 24 hr tablet TAKE 3 TABLETS EVERY MORNING 270 tablet 3   cariprazine  (VRAYLAR ) 3 MG capsule Take 1 capsule (3 mg total) by mouth daily. 90 capsule 0   clonazePAM  (KLONOPIN ) 0.5 MG tablet TAKE 1 TABLET TWICE DAILY AS NEEDED FOR ANXIETY 60 tablet 2   dicyclomine  (BENTYL ) 10 MG capsule Take 1 capsule (10 mg total) by mouth 3 (three) times daily as needed for spasms. 90 capsule 11   DULoxetine  (CYMBALTA ) 60 MG capsule TAKE 1 CAPSULE TWICE DAILY 180 capsule 3   Erenumab -aooe (AIMOVIG ) 70 MG/ML SOAJ INJECT 70 MG INTO THE SKIN EVERY 28 DAYS. 1 mL 11   Fe Fum-Fe Poly-Vit C-Lactobac (FUSION) 65-65-25-30 MG CAPS Take  by mouth.     ipratropium (ATROVENT) 0.06 % nasal spray      levothyroxine  (SYNTHROID , LEVOTHROID) 88 MCG tablet Take 88 mcg by mouth daily before breakfast.     liothyronine (CYTOMEL) 5 MCG tablet Take 5 mcg by mouth daily. Monday Wednesday Friday     methylphenidate   (RITALIN ) 10 MG tablet Take 10 mg by mouth 2 (two) times daily.     nystatin cream (MYCOSTATIN) Apply 1 application  topically daily as needed for dry skin.     omeprazole  (PRILOSEC) 20 MG capsule      ondansetron  (ZOFRAN -ODT) 4 MG disintegrating tablet Take 1 tablet (4 mg total) by mouth every 8 (eight) hours as needed for nausea or vomiting. 20 tablet 11   Oxycodone  HCl 10 MG TABS Take 10 mg by mouth every 6 (six) hours as needed.     Polyethyl Glycol-Propyl Glycol 0.4-0.3 % SOLN Apply 1 drop to eye as needed.     polyethylene glycol (MIRALAX / GLYCOLAX) 17 g packet Take 17 g by mouth 2 (two) times daily.     potassium chloride  (K-DUR,KLOR-CON ) 10 MEQ tablet Take 1 tablet by mouth daily.     promethazine  (PHENERGAN ) 12.5 MG tablet Take 1 tablet (12.5 mg total) by mouth every 8 (eight) hours as needed for nausea or vomiting. 30 tablet 0   rOPINIRole  (REQUIP ) 2 MG tablet Take 2 mg by mouth 2 (two) times daily.     terconazole (TERAZOL 3) 0.8 % vaginal cream Place 1 applicator vaginally at bedtime.     tiZANidine (ZANAFLEX) 2 MG tablet Take 2 mg by mouth 3 (three) times daily.     torsemide (DEMADEX) 20 MG tablet Take 20 mg by mouth 2 (two) times daily.     traZODone  (DESYREL ) 100 MG tablet TAKE 2 TABLETS BY MOUTH AT BEDTIME. 60 tablet 0   Ubrogepant  (UBRELVY ) 100 MG TABS Take 1 tablet (100 mg total) by mouth daily as needed (at onset of headaches may repeat dose after 2 hours as needed max 200 mg in 24 hours). 16 tablet 11   Current Facility-Administered Medications  Medication Dose Route Frequency Provider Last Rate Last Admin   0.9 %  sodium chloride  infusion  500 mL Intravenous Once Aneita Gwendlyn DASEN, MD        Medication Side Effects: None  Allergies:  Allergies  Allergen Reactions   Cyclobenzaprine Other (See Comments)    Other reaction(s): Unknown Hallucinations Unknown    Meloxicam Other (See Comments)   Naltrexone Other (See Comments)    SEVERE PAIN ALL OVER   Pork (Porcine)  Protein Other (See Comments)    Other reaction(s): Other (See Comments) Migraines Other reaction(s): Other (See Comments) Migraines   Sumatriptan Other (See Comments)    Difficulty breathing Other reaction(s): Other (see comments) Off balance    Tape Dermatitis   Flexeril [Cyclobenzaprine Hcl]    Imitrex [Sumatriptan Base]    Other    Sulfa Antibiotics     Pt states she does not think she is allergic to Sulfa     Past Medical History:  Diagnosis Date   Allergic rhinitis    Allergy    Anemia    Anxiety disorder    Cancer (HCC)    skin   Chronic fatigue    Chronic headaches    Chronic pain    Depression    Endometriosis    Family history of colon cancer    mother,uncles,aunts   Fibromyalgia    Gallstones  GERD (gastroesophageal reflux disease)    Hypothyroidism    IBS (irritable bowel syndrome)    Insomnia    Iron  deficiency    Laryngopharyngeal reflux (LPR)    Obesity    Personal history of colonic polyps 04/2006   polypoid   PONV (postoperative nausea and vomiting)    RLS (restless legs syndrome)    Sleep apnea    no cpap    Family History  Problem Relation Age of Onset   Colon cancer Mother        uncles,aunts   Diabetes Father        paternal grandmother,sister   Leukemia Father    Lymphoma Father    Skin cancer Father    Heart disease Father    Colon polyps Sister    Diabetes Sister    Colon cancer Maternal Aunt    Colon cancer Maternal Uncle    Colon polyps Cousin    Stomach cancer Neg Hx    Esophageal cancer Neg Hx    Rectal cancer Neg Hx     Social History   Socioeconomic History   Marital status: Single    Spouse name: Not on file   Number of children: 0   Years of education: Not on file   Highest education level: Not on file  Occupational History   Occupation: RN/disabled  Tobacco Use   Smoking status: Never   Smokeless tobacco: Never  Vaping Use   Vaping status: Never Used  Substance and Sexual Activity   Alcohol  use:  Not Currently    Alcohol /week: 0.0 standard drinks of alcohol     Comment: rarely   Drug use: No   Sexual activity: Not on file  Other Topics Concern   Not on file  Social History Narrative   Patient is left-handed. She lives alone in a one level home. She has 3 cats. She is unable to exercise. Caffeine one soda / day. On disability now. 2 bachelor degrees   Social Drivers of Corporate Investment Banker Strain: Not on file  Food Insecurity: Not on file  Transportation Needs: Not on file  Physical Activity: Not on file  Stress: Not on file  Social Connections: Not on file  Intimate Partner Violence: Not on file    Past Medical History, Surgical history, Social history, and Family history were reviewed and updated as appropriate.   Please see review of systems for further details on the patient's review from today.   Objective:   Physical Exam:  There were no vitals taken for this visit.  Physical Exam Constitutional:      General: She is not in acute distress. Musculoskeletal:        General: No deformity.  Neurological:     Mental Status: She is alert and oriented to person, place, and time.     Coordination: Coordination normal.  Psychiatric:        Attention and Perception: Attention and perception normal. She does not perceive auditory or visual hallucinations.        Mood and Affect: Mood normal. Mood is not anxious or depressed. Affect is not labile, blunt, angry or inappropriate.        Speech: Speech normal.        Behavior: Behavior normal.        Thought Content: Thought content normal. Thought content is not paranoid or delusional. Thought content does not include homicidal or suicidal ideation. Thought content does not include homicidal or suicidal plan.  Cognition and Memory: Cognition and memory normal.        Judgment: Judgment normal.     Comments: Insight intact     Lab Review:     Component Value Date/Time   NA 140 05/26/2019 1046   K 3.4  (L) 05/26/2019 1046   CL 95 (L) 05/26/2019 1046   CO2 37 (H) 05/26/2019 1046   GLUCOSE 97 05/26/2019 1046   BUN 17 05/26/2019 1046   CREATININE 1.09 05/26/2019 1046   CALCIUM 9.3 05/26/2019 1046   PROT 6.9 05/26/2019 1046   ALBUMIN 4.2 05/26/2019 1046   AST 36 05/26/2019 1046   ALT 30 05/26/2019 1046   ALKPHOS 98 05/26/2019 1046   BILITOT 0.4 05/26/2019 1046       Component Value Date/Time   WBC 6.9 05/26/2019 1046   RBC 4.42 05/26/2019 1046   HGB 12.6 05/26/2019 1046   HCT 38.7 05/26/2019 1046   PLT 209.0 05/26/2019 1046   MCV 87.6 05/26/2019 1046   MCHC 32.5 05/26/2019 1046   RDW 14.6 05/26/2019 1046   LYMPHSABS 2.0 05/26/2019 1046   MONOABS 0.6 05/26/2019 1046   EOSABS 0.1 05/26/2019 1046   BASOSABS 0.0 05/26/2019 1046    No results found for: POCLITH, LITHIUM   No results found for: PHENYTOIN, PHENOBARB, VALPROATE, CBMZ   .res Assessment: Plan:    Plan:  1. Wellbutrin  XL 450mg  daily - in the am  2. Cymbalta  60mg  BID 3. Trazadone 100mg  - 2 at hs  4. Vraylar  3mg  daily though patient assistance.  5. Clonazepam  0.5mg  BID - taking at bedtime 6. Ritalin  10mg  BID  Discussed Spravato for TRD symptoms and patient would like to move forward. Will send info to staff for review.   Continue therapy with Bambi Cottle.   RTC 6 weeks  Patient advised to contact office with any questions, adverse effects, or acute worsening in signs and symptoms.  Discussed potential metabolic side effects associated with atypical antipsychotics, as well as potential risk for movement side effects. Advised pt to contact office if movement side effects occur.   Discussed potential benefits, risk, and side effects of benzodiazepines to include potential risk of tolerance and dependence, as well as possible drowsiness. Advised patient not to drive if experiencing drowsiness and to take lowest possible effective dose to minimize risk of dependence and tolerance.    There are no  diagnoses linked to this encounter.   Please see After Visit Summary for patient specific instructions.  Future Appointments  Date Time Provider Department Center  12/16/2023  8:30 AM Brinden Kincheloe, Angeline Mattocks, NP [facility] None  01/07/2024  2:00 PM Cottle, Peggye MATSU, LCSW [facility] None  02/04/2024  2:00 PM Cottle, Bambi G, LCSW [facility] None  03/31/2024  2:00 PM Cottle, Bambi G, LCSW [facility] None  09/13/2024  1:50 PM [name], Adam R, DO [facility] None    No orders of the defined types were placed in this encounter.     -------------------------------

## 2023-12-19 DIAGNOSIS — M47816 Spondylosis without myelopathy or radiculopathy, lumbar region: Secondary | ICD-10-CM | POA: Diagnosis not present

## 2023-12-19 DIAGNOSIS — M545 Low back pain, unspecified: Secondary | ICD-10-CM | POA: Diagnosis not present

## 2023-12-23 DIAGNOSIS — M47816 Spondylosis without myelopathy or radiculopathy, lumbar region: Secondary | ICD-10-CM | POA: Diagnosis not present

## 2023-12-23 DIAGNOSIS — M545 Low back pain, unspecified: Secondary | ICD-10-CM | POA: Diagnosis not present

## 2023-12-25 DIAGNOSIS — M545 Low back pain, unspecified: Secondary | ICD-10-CM | POA: Diagnosis not present

## 2023-12-25 DIAGNOSIS — M47816 Spondylosis without myelopathy or radiculopathy, lumbar region: Secondary | ICD-10-CM | POA: Diagnosis not present

## 2023-12-26 ENCOUNTER — Telehealth: Payer: Self-pay | Admitting: Adult Health

## 2023-12-26 NOTE — Telephone Encounter (Signed)
PT lvm that a PA needs to be done on her ritalin. She has not had the medicine in a week now.

## 2023-12-26 NOTE — Telephone Encounter (Signed)
Pt just picked up her Ritalin on 12/16/23

## 2023-12-26 NOTE — Telephone Encounter (Signed)
There is not any PA's noted for pt, will submit as requested.

## 2023-12-26 NOTE — Telephone Encounter (Signed)
Tried Submitting a PA but [facility] reports no PA needed. Will contact them to check.

## 2023-12-29 DIAGNOSIS — R051 Acute cough: Secondary | ICD-10-CM | POA: Diagnosis not present

## 2023-12-29 DIAGNOSIS — R519 Headache, unspecified: Secondary | ICD-10-CM | POA: Diagnosis not present

## 2023-12-29 DIAGNOSIS — J019 Acute sinusitis, unspecified: Secondary | ICD-10-CM | POA: Diagnosis not present

## 2023-12-29 DIAGNOSIS — M545 Low back pain, unspecified: Secondary | ICD-10-CM | POA: Diagnosis not present

## 2023-12-29 DIAGNOSIS — Z6841 Body Mass Index (BMI) 40.0 and over, adult: Secondary | ICD-10-CM | POA: Diagnosis not present

## 2023-12-29 DIAGNOSIS — M47816 Spondylosis without myelopathy or radiculopathy, lumbar region: Secondary | ICD-10-CM | POA: Diagnosis not present

## 2023-12-31 DIAGNOSIS — M47816 Spondylosis without myelopathy or radiculopathy, lumbar region: Secondary | ICD-10-CM | POA: Diagnosis not present

## 2024-01-05 ENCOUNTER — Other Ambulatory Visit (HOSPITAL_COMMUNITY): Payer: Self-pay

## 2024-01-05 ENCOUNTER — Telehealth: Payer: Self-pay

## 2024-01-05 NOTE — Telephone Encounter (Signed)
PA request has been Started. New Encounter created for follow up. For additional info see Pharmacy Prior Auth telephone encounter from 02/03.

## 2024-01-05 NOTE — Telephone Encounter (Signed)
*  [facility]  Pharmacy Patient Advocate Encounter   Received notification from Patient Advice Request messages that prior authorization for Paige [name] is required/requested.   Insurance verification completed.   The patient is insured through [facility] .   Per test claim: The current 30 day co-pay is, $356.96.  No PA needed at this time. This test claim was processed through [facility]- copay amounts may vary at other pharmacies due to pharmacy/plan contracts, or as the patient moves through the different stages of their insurance plan.     Patient still has not met deductible which is contributing to price.

## 2024-01-06 DIAGNOSIS — M47816 Spondylosis without myelopathy or radiculopathy, lumbar region: Secondary | ICD-10-CM | POA: Diagnosis not present

## 2024-01-07 ENCOUNTER — Ambulatory Visit: Payer: Medicare Other | Admitting: Psychology

## 2024-01-07 DIAGNOSIS — F411 Generalized anxiety disorder: Secondary | ICD-10-CM | POA: Diagnosis not present

## 2024-01-07 DIAGNOSIS — M545 Low back pain, unspecified: Secondary | ICD-10-CM | POA: Diagnosis not present

## 2024-01-07 DIAGNOSIS — F3341 Major depressive disorder, recurrent, in partial remission: Secondary | ICD-10-CM | POA: Diagnosis not present

## 2024-01-07 DIAGNOSIS — F4310 Post-traumatic stress disorder, unspecified: Secondary | ICD-10-CM | POA: Diagnosis not present

## 2024-01-07 DIAGNOSIS — M47816 Spondylosis without myelopathy or radiculopathy, lumbar region: Secondary | ICD-10-CM | POA: Diagnosis not present

## 2024-01-07 NOTE — Progress Notes (Signed)
 [facility] Counselor/Therapist Progress Note  Patient ID: Paige [name], MRN: [id number],    Date: 01/07/2024  Time Spent: 56 minutes  Time in: 2:04  Time out:3:00  Treatment Type: Individual Therapy  Reported Symptoms: depression and worry  Mental Status Exam: Appearance:  Casual     Behavior: Appropriate  Motor: Normal  Speech/Language:  Normal Rate  Affect: blunted  Mood: anxious  Thought process: normal  Thought content:   WNL  Sensory/Perceptual disturbances:   WNL  Orientation: oriented to person, place, time/date, and situation  Attention: Good  Concentration: Good  Memory: WNL  Fund of knowledge:  Good  Insight:   Good  Judgment:  Fair  Impulse Control: Fair   Risk Assessment: Danger to Self:  No Self-injurious Behavior: No Danger to Others: No Duty to Warn:no Physical Aggression / Violence:No  Access to Firearms a concern: No  Gang Involvement:No   Subjective: The patient attended a face-to-face individual therapy session via video visit.  The patient gave verbal consent for this session to be on video on caregility and patient is aware of the limitations of telehealth..  The patient was in her home alone and therapist was in the office alone.  We ended up having to do the majority of the session on the telephone because we were having technical difficulties with keeping a connection going.  The patient presents with a blunted affect and mood was anxious.  The patient is a little less anxious than she was the last time I saw her.  The patient reports that her mother is doing a little better and that seems to make her feel less anxious.  I did explain that at some point her mother is not going to recover as her mother is in her late 44s and the patient seems to get very distraught about even thinking about the possibility that her mother may not be here any longer.  Her relationship is going well with her partner and that seems to be a positive.   She talked to me today a little bit about a Spravato treatment.  I explained to her that she probably needs to talk with her medication provider a little further about that before she makes that decision.  I would also recommend that she let her provider know that she has had a history of dissociation and I am not sure how that works with that treatment specifically.  She has not had those problems in quite some time but I think it is important for her medication provider to know.  I encouraged the patient to take care of herself and to continue to do good things for her self and we talked a bit about her changing some of her negative thinking to more  positive thoughts and see if that helps with her depression.    Interventions: Cognitive Behavioral Therapy  Diagnosis:Recurrent major depressive disorder, in partial remission (HCC)  Generalized anxiety disorder  PTSD (post-traumatic stress disorder)  Plan: Treatment Plan  Strengths/Abilities:  Intelligent, kind, motivated  Treatment Preferences:  Outpatient Individual therapy  Statement of Needs:  I need some help with getting back out into the world and learn how to interact and be social   Symptoms:  Describes a reliving of the event, particularly through dissociative flashbacks.:  (Status: improved). Displays a significant decline in interest and engagement in activities.: (Status: improved). Displays significant psychological and/or physiological  distress resulting from internal and external clues that are reminiscent of the traumatic event.:  (  Status: improved). Experiences disturbances in sleep.:   (Status: improved). Experiences disturbing and persistent thoughts, images, and/or perceptions of the  traumatic event.: (Status: improved). Experiences frequent nightmares.:  (Status: improved). Has been exposed to a traumatic event involving actual or  perceived threat of death or serious injury.: (Status: maintained).  Hypervigilance  (e.g., feeling constantly on edge, experiencing concentration difficulties, having trouble falling or staying asleep, exhibiting a general state of irritability).: (Status:  improved). Impairment in social, occupational, or other areas of functioning.:  (Status: improved). Intentionally avoids activities, places, people, or objects (e.g., up-armored vehicles) that evoke memories of the event.:  (Status: improved). Intentionally avoids  thoughts, feelings, or discussions related to the traumatic event.: (Status:  improved). Reports difficulty concentrating as well as feelings of guilt.:   (Status: improved). Reports response of intense fear, helplessness, or horror to the traumatic event.: (Status: improved). Symptoms present more than one month.:  (Status: maintained).    Problems Addressed:  Anxiety, Posttraumatic Stress Disorder (PTSD),  Goals:  LTG:  1. Enhance ability to effectively cope with the full variety of life's worries  and anxieties.  90% 2. No longer avoids persons, places, activities, and objects that are  reminiscent of the traumatic event.  90% 3. No longer experiences intrusive event recollections, avoidance of event  reminders, intense arousal, or disinterest in activities or  relationships.  100% 4. Stabilize anxiety level while increasing ability to function on a daily  basis.  90% 5. Thinks about or openly discusses the traumatic event with others  without experiencing psychological or physiological distress.  80%  STG: 1.Identify and engage in pleasant activities on a daily basis..  90% 2.Identify, challenge, and replace biased, fearful self-talk with positive, realistic, and empowering self talk 3.  Participate in Cognitive Therapy to help identify, challenge, and replace biased, negative, and selfdefeating thoughts resulting from the trauma.  80 % 4.  Participate in Eye Movement Desensitization and Reprocessing (EMDR) to reduce emotional distress  related to  traumatic thoughts, feelings, and images.  90%  Target Date:  02/17/2024 Frequency: Every three weeks Modality:Individual Therapy Interventions by Therapist:  CBT, EMDR, insight oriented therapy, problem solving  Patient approved treatment plan and is progressing nicely.  Kenston Longton G Chontel Warning, LCSW

## 2024-01-08 DIAGNOSIS — M47816 Spondylosis without myelopathy or radiculopathy, lumbar region: Secondary | ICD-10-CM | POA: Diagnosis not present

## 2024-01-08 DIAGNOSIS — M545 Low back pain, unspecified: Secondary | ICD-10-CM | POA: Diagnosis not present

## 2024-01-13 DIAGNOSIS — K5901 Slow transit constipation: Secondary | ICD-10-CM | POA: Diagnosis not present

## 2024-01-13 DIAGNOSIS — M47816 Spondylosis without myelopathy or radiculopathy, lumbar region: Secondary | ICD-10-CM | POA: Diagnosis not present

## 2024-01-13 DIAGNOSIS — R339 Retention of urine, unspecified: Secondary | ICD-10-CM | POA: Diagnosis not present

## 2024-01-13 DIAGNOSIS — G4700 Insomnia, unspecified: Secondary | ICD-10-CM | POA: Diagnosis not present

## 2024-01-13 DIAGNOSIS — G4733 Obstructive sleep apnea (adult) (pediatric): Secondary | ICD-10-CM | POA: Diagnosis not present

## 2024-01-13 DIAGNOSIS — M545 Low back pain, unspecified: Secondary | ICD-10-CM | POA: Diagnosis not present

## 2024-01-15 DIAGNOSIS — M47816 Spondylosis without myelopathy or radiculopathy, lumbar region: Secondary | ICD-10-CM | POA: Diagnosis not present

## 2024-01-15 DIAGNOSIS — M545 Low back pain, unspecified: Secondary | ICD-10-CM | POA: Diagnosis not present

## 2024-01-21 DIAGNOSIS — M545 Low back pain, unspecified: Secondary | ICD-10-CM | POA: Diagnosis not present

## 2024-01-21 DIAGNOSIS — M47816 Spondylosis without myelopathy or radiculopathy, lumbar region: Secondary | ICD-10-CM | POA: Diagnosis not present

## 2024-01-22 ENCOUNTER — Other Ambulatory Visit: Payer: Self-pay

## 2024-01-22 ENCOUNTER — Telehealth: Payer: Self-pay | Admitting: Adult Health

## 2024-01-22 MED ORDER — METHYLPHENIDATE HCL 10 MG PO TABS: 10.0000 mg | ORAL_TABLET | Freq: Two times a day (BID) | ORAL | 0 refills | Status: DC

## 2024-01-22 NOTE — Telephone Encounter (Signed)
Paige [name] called at 3:15 to request refill of her Ritalin.  Appt 2/25. Send to [facility] [phone] - [place],  - [address] AT [address]

## 2024-01-22 NOTE — Telephone Encounter (Signed)
PENDED RITALIN 10 MG TO [facility].

## 2024-01-23 DIAGNOSIS — J329 Chronic sinusitis, unspecified: Secondary | ICD-10-CM | POA: Diagnosis not present

## 2024-01-26 ENCOUNTER — Telehealth: Payer: Self-pay

## 2024-01-27 ENCOUNTER — Encounter: Payer: Self-pay | Admitting: Adult Health

## 2024-01-27 ENCOUNTER — Telehealth: Payer: Medicare Other | Admitting: Adult Health

## 2024-01-27 DIAGNOSIS — F39 Unspecified mood [affective] disorder: Secondary | ICD-10-CM

## 2024-01-27 DIAGNOSIS — F411 Generalized anxiety disorder: Secondary | ICD-10-CM | POA: Diagnosis not present

## 2024-01-27 DIAGNOSIS — F331 Major depressive disorder, recurrent, moderate: Secondary | ICD-10-CM

## 2024-01-27 DIAGNOSIS — F4310 Post-traumatic stress disorder, unspecified: Secondary | ICD-10-CM | POA: Diagnosis not present

## 2024-01-27 DIAGNOSIS — M545 Low back pain, unspecified: Secondary | ICD-10-CM | POA: Diagnosis not present

## 2024-01-27 DIAGNOSIS — G4700 Insomnia, unspecified: Secondary | ICD-10-CM

## 2024-01-27 DIAGNOSIS — F3341 Major depressive disorder, recurrent, in partial remission: Secondary | ICD-10-CM

## 2024-01-27 DIAGNOSIS — M47816 Spondylosis without myelopathy or radiculopathy, lumbar region: Secondary | ICD-10-CM | POA: Diagnosis not present

## 2024-01-27 MED ORDER — CARIPRAZINE HCL 3 MG PO CAPS: 3.0000 mg | ORAL_CAPSULE | Freq: Every day | ORAL | 0 refills | Status: DC

## 2024-01-27 MED ORDER — BUPROPION HCL ER (XL) 150 MG PO TB24: ORAL_TABLET | ORAL | 3 refills | Status: DC

## 2024-01-27 NOTE — Progress Notes (Signed)
 Paige [name] [id number] 1966/09/12 58 y.o.  Virtual Visit via Video Note  I connected with pt @ on 01/27/24 at  1:00 PM EST by a video enabled telemedicine application and verified that I am speaking with the correct person using two identifiers.   I discussed the limitations of evaluation and management by telemedicine and the availability of in person appointments. The patient expressed understanding and agreed to proceed.  I discussed the assessment and treatment plan with the patient. The patient was provided an opportunity to ask questions and all were answered. The patient agreed with the plan and demonstrated an understanding of the instructions.   The patient was advised to call back or seek an in-person evaluation if the symptoms worsen or if the condition fails to improve as anticipated.  I provided 25 minutes of non-face-to-face time during this encounter.  The patient was located at home.  The provider was located at [facility].   Dorothyann Gibbs, NP   Subjective:   Patient ID:  Paige [name] is a 58 y.o. (DOB 14-May-1966) female.  Chief Complaint: No chief complaint on file.   HPI Paige [name] presents for follow-up of GAD, MDD, PTSD and insomnia.  Describes mood today as "ok". Pleasant. Reports tearfulness. Mood symptoms - reports some depression and anxiety. Reports feeling more irritable than normal. Denies panic attacks. Reports some worry, rumination and over thinking. Reports mood as lower. Stating "I feel like I'm not doing too bad, but not where I want to be". Reports taking medications as prescribed. Continues to see therapist - Bambi Cottle.  Energy levels lower. Active, does not have a regular exercise routine. Working with PT twice a week and does daily exercises from home. Enjoys some usual interests and activities. Lives with partner and cats. Mother local and supportive. Attends church. Appetite adequate. Weight gain - 248 to  258 pounds - 68.5". Sleeps better some nights than others. Reports an average of 6 to 8 hours - "broken sleep". Diagnosed with sleep apnea - using BiPAP machine. Denies daytime napping. Focus and concentration "a little better". Completing tasks around the house. Working part time - 6 hours a week. Disabled since 2009. Denies SI or HI.  Denies AH or VH. Denies self harm. Denies substance use.  Previous medication trials: Lexapro, Paxil, Trazadone, Wellbutrin SR, Geodon, Seroquel, Risperdal, Depakote, Topamax, Lithium, Provigil, Ambien, Cytomel, Clonazepam and other benzodiazapines, Elavil, Effexor, Remeron, Prozac, Anafranil, Tofranil, Zoloft, Geodon, Buspar, Adderall, Ritalin, Abilify, Rexulti, Lamictal, Latuda   Review of Systems:  Review of Systems  Musculoskeletal:  Negative for gait problem.  Neurological:  Negative for tremors.  Psychiatric/Behavioral:         Please refer to HPI    Medications: I have reviewed the patient's current medications.  Current Outpatient Medications  Medication Sig Dispense Refill   buPROPion (WELLBUTRIN XL) 150 MG 24 hr tablet TAKE 3 TABLETS EVERY MORNING 270 tablet 3   cariprazine (VRAYLAR) 3 MG capsule Take 1 capsule (3 mg total) by mouth daily. 90 capsule 0   clonazePAM (KLONOPIN) 0.5 MG tablet Take 1 tablet (0.5 mg total) by mouth 2 (two) times daily. 60 tablet 2   dicyclomine (BENTYL) 10 MG capsule Take 1 capsule (10 mg total) by mouth 3 (three) times daily as needed for spasms. 90 capsule 11   DULoxetine (CYMBALTA) 60 MG capsule Take 1 capsule (60 mg total) by mouth 2 (two) times daily. 180 capsule 3   Erenumab-aooe (AIMOVIG) 70 MG/ML SOAJ INJECT  70 MG INTO THE SKIN EVERY 28 DAYS. 1 mL 11   Fe Fum-Fe Poly-Vit C-Lactobac (FUSION) 65-65-25-30 MG CAPS Take by mouth.     ipratropium (ATROVENT) 0.06 % nasal spray      levothyroxine (SYNTHROID, LEVOTHROID) 88 MCG tablet Take 88 mcg by mouth daily before breakfast.     liothyronine (CYTOMEL) 5 MCG  tablet Take 5 mcg by mouth daily. Monday Wednesday Friday     methylphenidate (RITALIN) 10 MG tablet Take 1 tablet (10 mg total) by mouth 2 (two) times daily. 60 tablet 0   nystatin cream (MYCOSTATIN) Apply 1 application  topically daily as needed for dry skin.     omeprazole (PRILOSEC) 20 MG capsule      ondansetron (ZOFRAN-ODT) 4 MG disintegrating tablet Take 1 tablet (4 mg total) by mouth every 8 (eight) hours as needed for nausea or vomiting. 20 tablet 11   Oxycodone HCl 10 MG TABS Take 10 mg by mouth every 6 (six) hours as needed.     Polyethyl Glycol-Propyl Glycol 0.4-0.3 % SOLN Apply 1 drop to eye as needed.     polyethylene glycol (MIRALAX / GLYCOLAX) 17 g packet Take 17 g by mouth 2 (two) times daily.     potassium chloride (K-DUR,KLOR-CON) 10 MEQ tablet Take 1 tablet by mouth daily.     promethazine (PHENERGAN) 12.5 MG tablet Take 1 tablet (12.5 mg total) by mouth every 8 (eight) hours as needed for nausea or vomiting. 30 tablet 0   rOPINIRole (REQUIP) 2 MG tablet Take 2 mg by mouth 2 (two) times daily.     terconazole (TERAZOL 3) 0.8 % vaginal cream Place 1 applicator vaginally at bedtime.     tiZANidine (ZANAFLEX) 2 MG tablet Take 2 mg by mouth 3 (three) times daily.     torsemide (DEMADEX) 20 MG tablet Take 20 mg by mouth 2 (two) times daily.     traZODone (DESYREL) 100 MG tablet Take 2 tablets (200 mg total) by mouth at bedtime. 180 tablet 3   Ubrogepant (UBRELVY) 100 MG TABS Take 1 tablet (100 mg total) by mouth daily as needed (at onset of headaches may repeat dose after 2 hours as needed max 200 mg in 24 hours). 16 tablet 11   Current Facility-Administered Medications  Medication Dose Route Frequency Provider Last Rate Last Admin   0.9 %  sodium chloride infusion  500 mL Intravenous Once Meryl Dare, MD        Medication Side Effects: None  Allergies:  Allergies  Allergen Reactions   Cyclobenzaprine Other (See Comments)    Other reaction(s):  Unknown Hallucinations Unknown    Meloxicam Other (See Comments)   Naltrexone Other (See Comments)    SEVERE PAIN ALL OVER   Pork (Porcine) Protein Other (See Comments)    Other reaction(s): Other (See Comments) Migraines Other reaction(s): Other (See Comments) Migraines   Sumatriptan Other (See Comments)    Difficulty breathing Other reaction(s): Other (see comments) Off balance    Tape Dermatitis   Flexeril [Cyclobenzaprine Hcl]    Imitrex [Sumatriptan Base]    Other    Sulfa Antibiotics     Pt states she does not think she is allergic to Sulfa     Past Medical History:  Diagnosis Date   Allergic rhinitis    Allergy    Anemia    Anxiety disorder    Cancer (HCC)    skin   Chronic fatigue    Chronic headaches    Chronic  pain    Depression    Endometriosis    Family history of colon cancer    mother,uncles,aunts   Fibromyalgia    Gallstones    GERD (gastroesophageal reflux disease)    Hypothyroidism    IBS (irritable bowel syndrome)    Insomnia    Iron deficiency    Laryngopharyngeal reflux (LPR)    Obesity    Personal history of colonic polyps 04/2006   polypoid   PONV (postoperative nausea and vomiting)    RLS (restless legs syndrome)    Sleep apnea    no cpap    Family History  Problem Relation Age of Onset   Colon cancer Mother        uncles,aunts   Diabetes Father        paternal grandmother,sister   Leukemia Father    Lymphoma Father    Skin cancer Father    Heart disease Father    Colon polyps Sister    Diabetes Sister    Colon cancer Maternal Aunt    Colon cancer Maternal Uncle    Colon polyps Cousin    Stomach cancer Neg Hx    Esophageal cancer Neg Hx    Rectal cancer Neg Hx     Social History   Socioeconomic History   Marital status: Single    Spouse name: Not on file   Number of children: 0   Years of education: Not on file   Highest education level: Not on file  Occupational History   Occupation: RN/disabled  Tobacco  Use   Smoking status: Never   Smokeless tobacco: Never  Vaping Use   Vaping status: Never Used  Substance and Sexual Activity   Alcohol use: Not Currently    Alcohol/week: 0.0 standard drinks of alcohol    Comment: rarely   Drug use: No   Sexual activity: Not on file  Other Topics Concern   Not on file  Social History Narrative   Patient is left-handed. She lives alone in a one level home. She has 3 cats. She is unable to exercise. Caffeine one soda / day. On disability now. 2 bachelor degrees   Social Drivers of Corporate investment banker Strain: Not on file  Food Insecurity: Not on file  Transportation Needs: Not on file  Physical Activity: Not on file  Stress: Not on file  Social Connections: Not on file  Intimate Partner Violence: Not on file    Past Medical History, Surgical history, Social history, and Family history were reviewed and updated as appropriate.   Please see review of systems for further details on the patient's review from today.   Objective:   Physical Exam:  There were no vitals taken for this visit.  Physical Exam Constitutional:      General: She is not in acute distress. Musculoskeletal:        General: No deformity.  Neurological:     Mental Status: She is alert and oriented to person, place, and time.     Coordination: Coordination normal.  Psychiatric:        Attention and Perception: Attention and perception normal. She does not perceive auditory or visual hallucinations.        Mood and Affect: Affect is not labile, blunt, angry or inappropriate.        Speech: Speech normal.        Behavior: Behavior normal.        Thought Content: Thought content normal. Thought content is not  paranoid or delusional. Thought content does not include homicidal or suicidal ideation. Thought content does not include homicidal or suicidal plan.        Cognition and Memory: Cognition and memory normal.        Judgment: Judgment normal.     Comments:  Insight intact     Lab Review:     Component Value Date/Time   NA 140 05/26/2019 1046   K 3.4 (L) 05/26/2019 1046   CL 95 (L) 05/26/2019 1046   CO2 37 (H) 05/26/2019 1046   GLUCOSE 97 05/26/2019 1046   BUN 17 05/26/2019 1046   CREATININE 1.09 05/26/2019 1046   CALCIUM 9.3 05/26/2019 1046   PROT 6.9 05/26/2019 1046   ALBUMIN 4.2 05/26/2019 1046   AST 36 05/26/2019 1046   ALT 30 05/26/2019 1046   ALKPHOS 98 05/26/2019 1046   BILITOT 0.4 05/26/2019 1046       Component Value Date/Time   WBC 6.9 05/26/2019 1046   RBC 4.42 05/26/2019 1046   HGB 12.6 05/26/2019 1046   HCT 38.7 05/26/2019 1046   PLT 209.0 05/26/2019 1046   MCV 87.6 05/26/2019 1046   MCHC 32.5 05/26/2019 1046   RDW 14.6 05/26/2019 1046   LYMPHSABS 2.0 05/26/2019 1046   MONOABS 0.6 05/26/2019 1046   EOSABS 0.1 05/26/2019 1046   BASOSABS 0.0 05/26/2019 1046    No results found for: "POCLITH", "LITHIUM"   No results found for: "PHENYTOIN", "PHENOBARB", "VALPROATE", "CBMZ"   .res Assessment: Plan:    Plan:  1. Wellbutrin XL 450mg  daily - in the am  2. Cymbalta 60mg  BID 3. Trazadone 100mg  - 2 at hs  4. Vraylar 3mg  daily though patient assistance.  5. Clonazepam 0.5mg  BID - taking at bedtime 6. Ritalin 10mg  BID - needed a prior auth.  Discussed Spravato for TRD symptoms and patient would like to move forward. Will send info to staff for review.   Continue therapy with Bambi Cottle.   RTC 6 weeks  25 minutes spent dedicated to the care of this patient on the date of this encounter to include pre-visit review of records, ordering of medication, post visit documentation, and face-to-face time with the patient discussing GAD, MDD, PTSD and insomnia. Discussed continuing current medication regimen.  Patient advised to contact office with any questions, adverse effects, or acute worsening in signs and symptoms.  Discussed potential metabolic side effects associated with atypical antipsychotics, as well as  potential risk for movement side effects. Advised pt to contact office if movement side effects occur.   Discussed potential benefits, risk, and side effects of benzodiazepines to include potential risk of tolerance and dependence, as well as possible drowsiness. Advised patient not to drive if experiencing drowsiness and to take lowest possible effective dose to minimize risk of dependence and tolerance.    There are no diagnoses linked to this encounter.   Please see After Visit Summary for patient specific instructions.  Future Appointments  Date Time Provider Department Center  01/27/2024  1:00 PM Vihaan Gloss, Thereasa Solo, NP [facility] None  01/28/2024  2:00 PM Cottle, Lynnell Dike, LCSW [facility] None  02/16/2024  9:20 AM Doree Albee, PA-C [facility]  02/25/2024  2:00 PM Cottle, Bambi G, LCSW [facility] None  03/31/2024  2:00 PM Cottle, Bambi G, LCSW [facility] None  04/28/2024  2:00 PM Cottle, Bambi G, LCSW [facility] None  05/18/2024 10:20 AM Marguerita Beards, MD [facility]  09/13/2024  1:50 PM Drema Dallas, DO [facility] None  No orders of the defined types were placed in this encounter.     -------------------------------

## 2024-01-27 NOTE — Telephone Encounter (Signed)
 Contacted pt's BC[employer] Medicare Plan to check on a PA for Methylphenidate 10 mg #60 for 30 day, they report they will submit one for the quantity, there is a limit up to #90 for 30day so she should get an approval. Only asked one question over the phone, should be a quick response via phone and fax.   PA# [id number]

## 2024-01-28 ENCOUNTER — Ambulatory Visit: Payer: Medicare Other | Admitting: Psychology

## 2024-01-28 DIAGNOSIS — F3341 Major depressive disorder, recurrent, in partial remission: Secondary | ICD-10-CM

## 2024-01-28 DIAGNOSIS — F4310 Post-traumatic stress disorder, unspecified: Secondary | ICD-10-CM

## 2024-01-28 DIAGNOSIS — F411 Generalized anxiety disorder: Secondary | ICD-10-CM

## 2024-01-28 NOTE — Telephone Encounter (Signed)
 Patient notified of PA approval for methylphenidate.

## 2024-01-28 NOTE — Progress Notes (Signed)
 [facility] Counselor/Therapist Progress Note  Patient ID: Paige [name], MRN: [id number],    Date: 01/28/2024  Time Spent: 56 minutes  Time in: 1:59  Time out:2:55  Treatment Type: Individual Therapy  Reported Symptoms: depression and worry  Mental Status Exam: Appearance:  Casual     Behavior: Appropriate  Motor: Normal  Speech/Language:  Normal Rate  Affect: blunted  Mood: pleasant  Thought process: normal  Thought content:   WNL  Sensory/Perceptual disturbances:   WNL  Orientation: oriented to person, place, time/date, and situation  Attention: Good  Concentration: Good  Memory: WNL  Fund of knowledge:  Good  Insight:   Good  Judgment:  Fair  Impulse Control: Fair   Risk Assessment: Danger to Self:  No Self-injurious Behavior: No Danger to Others: No Duty to Warn:no Physical Aggression / Violence:No  Access to Firearms a concern: No  Gang Involvement:No   Subjective: The patient attended a face-to-face individual therapy session via video visit.  The patient gave verbal consent for this session to be on video on caregility and patient is aware of the limitations of telehealth..  The patient was in her home alone and therapist was in the office alone.  The patient reports that she and [name] are going down to [place] next week.  She is excited about the trip and they will be gone for about 10 days.  She also will be going to the beach next month as well.  The patient reports that she talked to [facility] yesterday and she will not be doing a Spravato treatment just yet.  She did talk about how difficult it is for her to change her thoughts.  We talked about changing them to neutral or positive and that she needs to continue to work on that and catch herself when she is being negative.  She seems to be doing okay right now and we will continue to talk about how to work with herself on changing her thought process.    Interventions: Cognitive Behavioral  Therapy  Diagnosis:Recurrent major depressive disorder, in partial remission (HCC)  Generalized anxiety disorder  PTSD (post-traumatic stress disorder)  Plan: Treatment Plan  Strengths/Abilities:  Intelligent, kind, motivated  Treatment Preferences:  Outpatient Individual therapy  Statement of Needs:  "I need some help with getting back out into the world and learn how to interact and be social"   Symptoms:  Describes a reliving of the event, particularly through dissociative flashbacks.:  (Status: improved). Displays a significant decline in interest and engagement in activities.: (Status: improved). Displays significant psychological and/or physiological  distress resulting from internal and external clues that are reminiscent of the traumatic event.:  (Status: improved). Experiences disturbances in sleep.:   (Status: improved). Experiences disturbing and persistent thoughts, images, and/or perceptions of the  traumatic event.: (Status: improved). Experiences frequent nightmares.:  (Status: improved). Has been exposed to a traumatic event involving actual or  perceived threat of death or serious injury.: (Status: maintained).  Hypervigilance (e.g., feeling constantly on edge, experiencing concentration difficulties, having trouble falling or staying asleep, exhibiting a general state of irritability).: (Status:  improved). Impairment in social, occupational, or other areas of functioning.:  (Status: improved). Intentionally avoids activities, places, people, or objects (e.g., up-armored vehicles) that evoke memories of the event.:  (Status: improved). Intentionally avoids  thoughts, feelings, or discussions related to the traumatic event.: (Status:  improved). Reports difficulty concentrating as well as feelings of guilt.:   (Status: improved). Reports response of intense fear,  helplessness, or horror to the traumatic event.: (Status: improved). Symptoms present more than one month.:   (Status: maintained).    Problems Addressed:  Anxiety, Posttraumatic Stress Disorder (PTSD),  Goals:  LTG:  1. Enhance ability to effectively cope with the full variety of life's worries  and anxieties.  90% 2. No longer avoids persons, places, activities, and objects that are  reminiscent of the traumatic event.  90% 3. No longer experiences intrusive event recollections, avoidance of event  reminders, intense arousal, or disinterest in activities or  relationships.  100% 4. Stabilize anxiety level while increasing ability to function on a daily  basis.  90% 5. Thinks about or openly discusses the traumatic event with others  without experiencing psychological or physiological distress.  80%  STG: 1.Identify and engage in pleasant activities on a daily basis..  90% 2.Identify, challenge, and replace biased, fearful self-talk with positive, realistic, and empowering self talk 3.  Participate in Cognitive Therapy to help identify, challenge, and replace biased, negative, and selfdefeating thoughts resulting from the trauma.  80 % 4.  Participate in Eye Movement Desensitization and Reprocessing (EMDR) to reduce emotional distress  related to traumatic thoughts, feelings, and images.  90%  Target Date:  02/17/2024 Frequency: Every three weeks Modality:Individual Therapy Interventions by Therapist:  CBT, EMDR, insight oriented therapy, problem solving  Patient approved treatment plan and is progressing nicely.  Wasif Simonich G Dozier Berkovich, LCSW

## 2024-01-28 NOTE — Telephone Encounter (Signed)
 [employer] [place] approved quantity of #60 of Methylphenidate 10 mg tablet effective through 01/26/25  Member ID# [id number]

## 2024-01-29 DIAGNOSIS — M47816 Spondylosis without myelopathy or radiculopathy, lumbar region: Secondary | ICD-10-CM | POA: Diagnosis not present

## 2024-01-29 DIAGNOSIS — M545 Low back pain, unspecified: Secondary | ICD-10-CM | POA: Diagnosis not present

## 2024-02-04 ENCOUNTER — Ambulatory Visit: Payer: Medicare HMO | Admitting: Psychology

## 2024-02-13 DIAGNOSIS — M47816 Spondylosis without myelopathy or radiculopathy, lumbar region: Secondary | ICD-10-CM | POA: Diagnosis not present

## 2024-02-16 ENCOUNTER — Encounter: Payer: Self-pay | Admitting: Physician Assistant

## 2024-02-16 ENCOUNTER — Ambulatory Visit: Payer: Medicare Other | Admitting: Physician Assistant

## 2024-02-16 VITALS — BP 110/78 | HR 76 | Ht 66.5 in | Wt 262.1 lb

## 2024-02-16 DIAGNOSIS — Z800 Family history of malignant neoplasm of digestive organs: Secondary | ICD-10-CM

## 2024-02-16 DIAGNOSIS — K581 Irritable bowel syndrome with constipation: Secondary | ICD-10-CM

## 2024-02-16 DIAGNOSIS — R1310 Dysphagia, unspecified: Secondary | ICD-10-CM | POA: Diagnosis not present

## 2024-02-16 DIAGNOSIS — K219 Gastro-esophageal reflux disease without esophagitis: Secondary | ICD-10-CM | POA: Diagnosis not present

## 2024-02-16 DIAGNOSIS — K5909 Other constipation: Secondary | ICD-10-CM | POA: Diagnosis not present

## 2024-02-16 DIAGNOSIS — Z860101 Personal history of adenomatous and serrated colon polyps: Secondary | ICD-10-CM

## 2024-02-16 DIAGNOSIS — G4733 Obstructive sleep apnea (adult) (pediatric): Secondary | ICD-10-CM | POA: Diagnosis not present

## 2024-02-16 DIAGNOSIS — M479 Spondylosis, unspecified: Secondary | ICD-10-CM | POA: Diagnosis not present

## 2024-02-16 DIAGNOSIS — K5903 Drug induced constipation: Secondary | ICD-10-CM

## 2024-02-16 DIAGNOSIS — Z9884 Bariatric surgery status: Secondary | ICD-10-CM | POA: Diagnosis not present

## 2024-02-16 DIAGNOSIS — R1319 Other dysphagia: Secondary | ICD-10-CM

## 2024-02-16 NOTE — Patient Instructions (Addendum)
 We have given you samples of the following medication to take: Paige [name] Lot [id number] A Exp 03-31-25 (5)  You have been scheduled for a Barium Esophogram at [facility] (1st floor of the hospital) on 02-25-24 at 11am. Please arrive 30 minutes prior to your appointment for registration. Make certain not to have anything to eat or drink 3 hours prior to your test. If you need to reschedule for any reason, please contact radiology at [phone] to do so. __________________________________________________________________ A barium swallow is an examination that concentrates on views of the esophagus. This tends to be a double contrast exam (barium and two liquids which, when combined, create a gas to distend the wall of the oesophagus) or single contrast (non-ionic iodine based). The study is usually tailored to your symptoms so a good history is essential. Attention is paid during the study to the form, structure and configuration of the esophagus, looking for functional disorders (such as aspiration, dysphagia, achalasia, motility and reflux) EXAMINATION You may be asked to change into a gown, depending on the type of swallow being performed. A radiologist and radiographer will perform the procedure. The radiologist will advise you of the type of contrast selected for your procedure and direct you during the exam. You will be asked to stand, sit or lie in several different positions and to hold a small amount of fluid in your mouth before being asked to swallow while the imaging is performed .In some instances you may be asked to swallow barium coated marshmallows to assess the motility of a solid food bolus. The exam can be recorded as a digital or video fluoroscopy procedure. POST PROCEDURE It will take 1-2 days for the barium to pass through your system. To facilitate this, it is important, unless otherwise directed, to increase your fluids for the next 24-48hrs and to resume your normal diet.   This test typically takes about 30 minutes to perform. __________________________________________________________________________________  Paige [name] have been scheduled for an endoscopy. Please follow written instructions given to you at your visit today.  If you use inhalers (even only as needed), please bring them with you on the day of your procedure.  If you take any of the following medications, they will need to be adjusted prior to your procedure:   DO NOT TAKE 7 DAYS PRIOR TO TEST- Trulicity (dulaglutide) Ozempic, Wegovy (semaglutide) Mounjaro (tirzepatide) Bydureon Bcise (exanatide extended release)  DO NOT TAKE 1 DAY PRIOR TO YOUR TEST Rybelsus (semaglutide) Adlyxin (lixisenatide) Victoza (liraglutide) Byetta (exanatide) ___________________________________________________________________________   Toileting tips to help with your constipation - Drink at least 64-80 ounces of water/liquid per day. - Establish a time to try to move your bowels every day.  For many people, this is after a cup of coffee or after a meal such as breakfast. - Sit all of the way back on the toilet keeping your back fairly straight and while sitting up, try to rest the tops of your forearms on your upper thighs.   - Raising your feet with a step stool/squatty potty can be helpful to improve the angle that allows your stool to pass through the rectum. - Relax the rectum feeling it bulge toward the toilet water.  If you feel your rectum raising toward your body, you are contracting rather than relaxing. - Breathe in and slowly exhale. "Belly breath" by expanding your belly towards your belly button. Keep belly expanded as you gently direct pressure down and back to the anus.  A low pitched GRRR sound can  assist with increasing intra-abdominal pressure.  (Can also trying to blow on a pinwheel and make it move, this helps with the same belly breathing) - Repeat 3-4 times. If unsuccessful, contract the pelvic  floor to restore normal tone and get off the toilet.  Avoid excessive straining. - To reduce excessive wiping by teaching your anus to normally contract, place hands on outer aspect of knees and resist knee movement outward.  Hold 5-10 second then place hands just inside of knees and resist inward movement of knees.  Hold 5 seconds.  Repeat a few times each way.  Go to the ER if unable to pass gas, severe AB pain, unable to hold down food, any shortness of breath of chest pain.   Here some information about pelvic floor dysfunction. This may be contributing to some of your symptoms. We will continue with our evaluation but I do want you to consider adding on fiber supplement with low-dose MiraLAX daily. We could also refer to pelvic floor physical therapy.   Pelvic Floor Dysfunction, Female Pelvic floor dysfunction (PFD) is a condition that results when the group of muscles and connective tissues that support the organs in the pelvis (pelvic floor muscles) do not work well. These muscles and their connections form a sling that supports the colon and bladder. In women, they also support the uterus. PFD causes pelvic floor muscles to be too weak, too tight, or both. In PFD, muscle movements are not coordinated. This may cause bowel or bladder problems. It may also cause pain. What are the causes? This condition may be caused by an injury to the pelvic area or by a weakening of pelvic muscles. This often results from pregnancy and childbirth or other types of strain. In many cases, the exact cause is not known. What increases the risk? The following factors may make you more likely to develop this condition: Having chronic bladder tissue inflammation (interstitial cystitis). Being an older person. Being overweight. History of radiation treatment for cancer in the pelvic region. Previous pelvic surgery, such as removal of the uterus (hysterectomy). What are the signs or symptoms? Symptoms of  this condition vary and may include: Bladder symptoms, such as: Trouble starting urination and emptying the bladder. Frequent urinary tract infections. Leaking urine when coughing, laughing, or exercising (stress incontinence). Having to pass urine urgently or frequently. Pain when passing urine. Bowel symptoms, such as: Constipation. Urgent or frequent bowel movements. Incomplete bowel movements. Painful bowel movements. Leaking stool or gas. Unexplained genital or rectal pain. Genital or rectal muscle spasms. Low back pain. Other symptoms may include: A heavy, full, or aching feeling in the vagina. A bulge that protrudes into the vagina. Pain during or after sex. How is this diagnosed? This condition may be diagnosed based on: Your symptoms and medical history. A physical exam. During the exam, your health care provider may check your pelvic muscles for tightness, spasm, pain, or weakness. This may include a rectal exam and a pelvic exam. In some cases, you may have diagnostic tests, such as: Electrical muscle function tests. Urine flow testing. X-ray tests of bowel function. Ultrasound of the pelvic organs. How is this treated? Treatment for this condition depends on the symptoms. Treatment options include: Physical therapy. This may include Kegel exercises to help relax or strengthen the pelvic floor muscles. Biofeedback. This type of therapy provides feedback on how tight your pelvic floor muscles are so that you can learn to control them. Internal or external massage therapy. A treatment  that involves electrical stimulation of the pelvic floor muscles to help control pain (transcutaneous electrical nerve stimulation, or TENS). Sound wave therapy (ultrasound) to reduce muscle spasms. Medicines, such as: Muscle relaxants. Bladder control medicines. Surgery to reconstruct or support pelvic floor muscles may be an option if other treatments do not help. Follow these  instructions at home: Activity Do your usual activities as told by your health care provider. Ask your health care provider if you should modify any activities. Do pelvic floor strengthening or relaxing exercises at home as told by your physical therapist. Lifestyle Maintain a healthy weight. Eat foods that are high in fiber, such as beans, whole grains, and fresh fruits and vegetables. Limit foods that are high in fat and processed sugars, such as fried or sweet foods. Manage stress with relaxation techniques such as yoga or meditation. General instructions If you have problems with leakage: Use absorbable pads or wear padded underwear. Wash frequently with mild soap. Keep your genital and anal area as clean and dry as possible. Ask your health care provider if you should try a barrier cream to prevent skin irritation. Take warm baths to relieve pelvic muscle tension or spasms. Take over-the-counter and prescription medicines only as told by your health care provider. Keep all follow-up visits. How is this prevented? The cause of PFD is not always known, but there are a few things you can do to reduce the risk of developing this condition, including: Staying at a healthy weight. Getting regular exercise. Managing stress. Contact a health care provider if: Your symptoms are not improving with home care. You have signs or symptoms of PFD that get worse at home. You develop new signs or symptoms. You have signs of a urinary tract infection, such as: Fever. Chills. Increased urinary frequency. A burning feeling when urinating. You have not had a bowel movement in 3 days (constipation). Summary Pelvic floor dysfunction results when the muscles and connective tissues in your pelvic floor do not work well. These muscles and their connections form a sling that supports your colon and bladder. In women, they also support the uterus. PFD may be caused by an injury to the pelvic area or by  a weakening of pelvic muscles. PFD causes pelvic floor muscles to be too weak, too tight, or a combination of both. Symptoms may vary from person to person. In most cases, PFD can be treated with physical therapies and medicines. Surgery may be an option if other treatments do not help. This information is not intended to replace advice given to you by your health care provider. Make sure you discuss any questions you have with your health care provider. Document Revised: 03/28/2021 Document Reviewed: 03/28/2021 Elsevier Patient Education  2022 [employer]  Gastroparesis Please do small frequent meals like 4-6 meals a day.  Eat and drink liquids at separate times.  Avoid high fiber foods, cook your vegetables, avoid high fat food.  Suggest spreading protein throughout the day (greek yogurt, glucerna, soft meat, milk, eggs) Choose soft foods that you can mash with a fork When you are more symptomatic, change to pureed foods foods and liquids.  Consider reading "Living well with Gastroparesis" by Reuel Derby Gastroparesis is a condition in which food takes longer than normal to empty from the stomach. This condition is also known as delayed gastric emptying. It is usually a long-term (chronic) condition. There is no cure, but there are treatments and things that you can do at home to help relieve symptoms.  Treating the underlying condition that causes gastroparesis can also help relieve symptoms What are the causes? In many cases, the cause of this condition is not known. Possible causes include: A hormone (endocrine) disorder, such as hypothyroidism or diabetes. A nervous system disease, such as Parkinson's disease or multiple sclerosis. Cancer, infection, or surgery that affects the stomach or vagus nerve. The vagus nerve runs from your chest, through your neck, and to the lower part of your brain. A connective tissue disorder, such as scleroderma. Certain medicines. What increases  the risk? You are more likely to develop this condition if: You have certain disorders or diseases. These may include: An endocrine disorder. An eating disorder. Amyloidosis. Scleroderma. Parkinson's disease. Multiple sclerosis. Cancer or infection of the stomach or the vagus nerve. You have had surgery on your stomach or vagus nerve. You take certain medicines. You are female. What are the signs or symptoms? Symptoms of this condition include: Feeling full after eating very little or a loss of appetite. Nausea, vomiting, or heartburn. Bloating of your abdomen. Inconsistent blood sugar (glucose) levels on blood tests. Unexplained weight loss. Acid from the stomach coming up into the esophagus (gastroesophageal reflux). Sudden tightening (spasm) of the stomach, which can be painful. Symptoms may come and go. Some people may not notice any symptoms. How is this diagnosed? This condition is diagnosed with tests, such as: Tests that check how long it takes food to move through the stomach and intestines. These tests include: Upper gastrointestinal (GI) series. For this test, you drink a liquid that shows up well on X-rays, and then X-rays are taken of your intestines. Gastric emptying scintigraphy. For this test, you eat food that contains a small amount of radioactive material, and then scans are taken. Wireless capsule GI monitoring system. For this test, you swallow a pill (capsule) that records information about how foods and fluid move through your stomach. Gastric manometry. For this test, a tube is passed down your throat and into your stomach to measure electrical and muscular activity. Endoscopy. For this test, a long, thin tube with a camera and light on the end is passed down your throat and into your stomach to check for problems in your stomach lining. Ultrasound. This test uses sound waves to create images of the inside of your body. This can help rule out gallbladder disease  or pancreatitis as a cause of your symptoms. How is this treated? There is no cure for this condition, but treatment and home care may relieve symptoms. Treatment may include: Treating the underlying cause. Managing your symptoms by making changes to your diet and exercise habits. Taking medicines to control nausea and vomiting and to stimulate stomach muscles. Getting food through a feeding tube in the hospital. This may be done in severe cases. Having surgery to insert a device called a gastric electrical stimulator into your body. This device helps improve stomach emptying and control nausea and vomiting. Follow these instructions at home: Take over-the-counter and prescription medicines only as told by your health care provider. Follow instructions from your health care provider about eating or drinking restrictions. Your health care provider may recommend that you: Eat smaller meals more often. Eat low-fat foods. Eat low-fiber forms of high-fiber foods. For example, eat cooked vegetables instead of raw vegetables. Have only liquid foods instead of solid foods. Liquid foods are easier to digest. Drink enough fluid to keep your urine pale yellow. Exercise as often as told by your health care provider. Keep all follow-up  visits. This is important. Contact a health care provider if you: Notice that your symptoms do not improve with treatment. Have new symptoms. Get help right away if you: Have severe pain in your abdomen that does not improve with treatment. Have nausea that is severe or does not go away. Vomit every time you drink fluids. Summary Gastroparesis is a long-term (chronic) condition in which food takes longer than normal to empty from the stomach. Symptoms include nausea, vomiting, heartburn, bloating of your abdomen, and loss of appetite. Eating smaller portions, low-fat foods, and low-fiber forms of high-fiber foods may help you manage your symptoms. Get help right away  if you have severe pain in your abdomen. This information is not intended to replace advice given to you by your health care provider. Make sure you discuss any questions you have with your health care provider. Document Revised: 03/27/2020 Document Reviewed: 03/27/2020 Elsevier Patient Education  2021 [employer].

## 2024-02-16 NOTE — Progress Notes (Signed)
 02/16/2024 Paige [name] [id number] Dec 08, 1965  Referring provider: Philemon Kingdom, MD Primary GI doctor: Dr. Leonides Schanz  ( Dr. 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 I discussed risks of EGD with patient today, including risk of sedation, bleeding or perforation.  Patient provides understanding and gave verbal consent to proceed.  Constipation/IBS with fecal incontinence rare BM once a week, increasing flatuance Occ fecal leakage and bowel incontinence She is on oxycodone for back pain, 10 6 pills a day Has spinal cord stimulator, states back doctor does no think related to her back possible component of pelvis floor dysfunction with history and symptoms, follow up with urogyn June 16, consider pelvic floor PT/manometry Has failed linzess, dulcolax, enemas, mag citrate, amitiza, could not get movantik due to cost Will try isbrella samples 50 mg BID, if this is not covered, will try amitza 24 mcg with miralax and fiber ( had leakage before) Due for colon 10/2024  Screening colonoscopy 10/04/2021 with Dr. Russella Dar for personal history of adenomatous polyps family history of colon cancer showed 4 polyps ranging 7 to 10 mm sigmoid colon transverse descending, small lipoma transverse recall 3 years Due 10/2024   Status post Roux-en-Y Follow up PCP  [facility] On Bipap since may 2024  Patient Care Team: Philemon Kingdom, MD as PCP - General (Internal Medicine) Drema Dallas, DO as Consulting Physician (Neurology)  HISTORY OF PRESENT ILLNESS: 59 y.o. female with a past medical history of  Roux-en-Y, constipation/IBS, IDA, GERD and others listed below presents for evaluation of Constipation and dysphagia.   Discussed the use of AI scribe software for clinical note transcription with the patient, who gave verbal consent to proceed.  History of Present Illness   Paige [name] "Paige [name]" is a 58 year old female who presents with gastrointestinal symptoms.  She experiences chronic constipation despite trying various medications, including lubiprostone, lactulose, Miralax, and Linzess. Some treatments, like Miralax, have caused leakage without effective bowel movements. She experiences occasional bowel incontinence without awareness and has tried magnesium citrate without results. She is scheduled to see a urogynecologist in June for potential pelvic floor dysfunction.  She experiences random hiccups that occur every time she starts eating and sometimes wake her up at night. She also coughs when drinking cold liquids and has frequent throat pain. She takes omeprazole twice daily for reflux, which controls her heartburn. She has a history of Roux-en-Y gastric bypass and small jejunal erosions. No blood in the stool or dark black stool. She reports difficulty with urination and occasional trouble swallowing, with food sometimes getting caught in the upper and mid esophagus. She rarely experiences this with liquids, though she does cough when drinking cold liquids. She denies any history of neck surgeries or radiation.  She takes oxycodone with Tylenol every four hours, totaling six pills a day, for back pain. Previously, she took oxycodone with ibuprofen but switched a few weeks ago due to availability issues. She has a spinal cord stimulator and is scheduled for a battery replacement next month.  She has been on BiPAP since May of last year for sleep apnea, after initially using CPAP. She  experiences significant gas, including burping, belching, and flatulence, which she wonders might be  related to BiPAP use.        She  reports that she has never smoked. She has never used smokeless tobacco. She reports that she does not currently use alcohol. She reports that she does not use drugs.  RELEVANT GI HISTORY, IMAGING AND LABS:  CBC    Component Value Date/Time   WBC 6.9 05/26/2019 1046   RBC 4.42 05/26/2019 1046   HGB 12.6 05/26/2019 1046   HCT 38.7 05/26/2019 1046   PLT 209.0 05/26/2019 1046   MCV 87.6 05/26/2019 1046   MCHC 32.5 05/26/2019 1046   RDW 14.6 05/26/2019 1046   LYMPHSABS 2.0 05/26/2019 1046   MONOABS 0.6 05/26/2019 1046   EOSABS 0.1 05/26/2019 1046   BASOSABS 0.0 05/26/2019 1046   No results for input(s): "HGB" in the last 8760 hours.  CMP     Component Value Date/Time   NA 140 05/26/2019 1046   K 3.4 (L) 05/26/2019 1046   CL 95 (L) 05/26/2019 1046   CO2 37 (H) 05/26/2019 1046   GLUCOSE 97 05/26/2019 1046   BUN 17 05/26/2019 1046   CREATININE 1.09 05/26/2019 1046   CALCIUM 9.3 05/26/2019 1046   PROT 6.9 05/26/2019 1046   ALBUMIN 4.2 05/26/2019 1046   AST 36 05/26/2019 1046   ALT 30 05/26/2019 1046   ALKPHOS 98 05/26/2019 1046   BILITOT 0.4 05/26/2019 1046      Latest Ref Rng & Units 05/26/2019   10:46 AM 05/12/2017   12:22 PM  Hepatic Function  Total Protein 6.0 - 8.3 g/dL 6.9  6.5   Albumin 3.5 - 5.2 g/dL 4.2  4.2   AST 0 - 37 U/L 36  34   ALT 0 - 35 U/L 30  49   Alk Phosphatase 39 - 117 U/L 98  63   Total Bilirubin 0.2 - 1.2 mg/dL 0.4  0.3   Bilirubin, Direct 0.0 - 0.3 mg/dL  0.1       Current Medications:   Current Outpatient Medications (Endocrine & Metabolic):    estradiol (ESTRACE) 0.5 MG tablet, Take 0.5 mg by mouth daily.   levothyroxine (SYNTHROID, LEVOTHROID) 88 MCG tablet, Take 88 mcg by mouth daily before breakfast.   liothyronine (CYTOMEL) 5 MCG tablet, Take 5 mcg by mouth daily. Monday Wednesday Friday   progesterone (PROMETRIUM) 100 MG capsule, Take 100 mg by mouth daily.   Current Outpatient Medications  (Cardiovascular):    torsemide (DEMADEX) 20 MG tablet, Take 20 mg by mouth 2 (two) times daily.   Current Outpatient Medications (Respiratory):    ipratropium (ATROVENT) 0.06 % nasal spray,    promethazine (PHENERGAN) 12.5 MG tablet, Take 1 tablet (12.5 mg total) by mouth every 8 (eight) hours as needed for nausea or vomiting.   Current Outpatient Medications (Analgesics):    Erenumab-aooe (AIMOVIG) 70 MG/ML SOAJ, INJECT 70 MG INTO THE SKIN EVERY 28 DAYS.   oxyCODONE-acetaminophen (PERCOCET) 10-325 MG tablet, Take 1 tablet by mouth every 4 (four) hours as needed.   Ubrogepant (UBRELVY) 100 MG TABS, Take 1 tablet (100 mg total) by mouth daily as needed (at onset of headaches may repeat dose after 2 hours as needed max 200 mg in 24 hours).   ibuprofen (ADVIL) 800 MG tablet, Take 800 mg by mouth 3 (three) times daily. (Patient not taking: Reported on 02/16/2024)   Oxycodone HCl 10 MG TABS, Take 10 mg by mouth every 6 (six)  hours as needed. (Patient not taking: Reported on 02/16/2024)   Current Outpatient Medications (Hematological):    Fe Fum-Fe Poly-Vit C-Lactobac (FUSION) 65-65-25-30 MG CAPS, Take by mouth.   Current Outpatient Medications (Other):    buPROPion (WELLBUTRIN XL) 150 MG 24 hr tablet, TAKE 3 TABLETS EVERY MORNING   cariprazine (VRAYLAR) 3 MG capsule, Take 1 capsule (3 mg total) by mouth daily.   clonazePAM (KLONOPIN) 0.5 MG tablet, Take 1 tablet (0.5 mg total) by mouth 2 (two) times daily.   dicyclomine (BENTYL) 10 MG capsule, Take 1 capsule (10 mg total) by mouth 3 (three) times daily as needed for spasms.   docusate sodium (COLACE) 100 MG capsule, Take 100 mg by mouth 2 (two) times daily.   DULoxetine (CYMBALTA) 60 MG capsule, Take 1 capsule (60 mg total) by mouth 2 (two) times daily.   methylphenidate (RITALIN) 10 MG tablet, Take 1 tablet (10 mg total) by mouth 2 (two) times daily.   nystatin cream (MYCOSTATIN), Apply 1 application  topically daily as needed for dry skin.    omeprazole (PRILOSEC) 20 MG capsule,    ondansetron (ZOFRAN-ODT) 4 MG disintegrating tablet, Take 1 tablet (4 mg total) by mouth every 8 (eight) hours as needed for nausea or vomiting.   Polyethyl Glycol-Propyl Glycol 0.4-0.3 % SOLN, Apply 1 drop to eye as needed.   polyethylene glycol (MIRALAX / GLYCOLAX) 17 g packet, Take 17 g by mouth 2 (two) times daily.   potassium chloride (K-DUR,KLOR-CON) 10 MEQ tablet, Take 1 tablet by mouth daily.   rOPINIRole (REQUIP) 2 MG tablet, Take 2 mg by mouth 2 (two) times daily.   terconazole (TERAZOL 3) 0.8 % vaginal cream, Place 1 applicator vaginally at bedtime.   tiZANidine (ZANAFLEX) 2 MG tablet, Take 2 mg by mouth 3 (three) times daily.   tiZANidine (ZANAFLEX) 4 MG tablet, Take 4 mg by mouth 2 (two) times daily.   traZODone (DESYREL) 100 MG tablet, Take 2 tablets (200 mg total) by mouth at bedtime.  Current Facility-Administered Medications (Other):    0.9 %  sodium chloride infusion  Medical History:  Past Medical History:  Diagnosis Date   Allergic rhinitis    Allergy    Anemia    Anxiety disorder    Cancer (HCC)    skin   Chronic fatigue    Chronic headaches    Chronic pain    Depression    Endometriosis    Family history of colon cancer    mother,uncles,aunts   Fibromyalgia    Gallstones    GERD (gastroesophageal reflux disease)    Hypothyroidism    IBS (irritable bowel syndrome)    Insomnia    Iron deficiency    Laryngopharyngeal reflux (LPR)    Obesity    Personal history of colonic polyps 04/2006   polypoid   PONV (postoperative nausea and vomiting)    RLS (restless legs syndrome)    Sleep apnea    no cpap   Allergies:  Allergies  Allergen Reactions   Cyclobenzaprine Other (See Comments)    Other reaction(s): Unknown Hallucinations Unknown    Meloxicam Other (See Comments)   Naltrexone Other (See Comments)    SEVERE PAIN ALL OVER   Pork (Porcine) Protein Other (See Comments)    Other reaction(s): Other (See  Comments) Migraines Other reaction(s): Other (See Comments) Migraines   Sumatriptan Other (See Comments)    Difficulty breathing Other reaction(s): Other (see comments) Off balance    Tape Dermatitis   Flexeril [Cyclobenzaprine  Hcl]    Imitrex [Sumatriptan Base]    Other    Sulfa Antibiotics     Pt states she does not think she is allergic to Sulfa      Surgical History:  She  has a past surgical history that includes Roux-en-Y Gastric Bypass (04/2004); Appendectomy (1993); Cholecystectomy (1999); Repair ankle ligament (Left, 2001); Anterior cruciate ligament repair (Left, 2003); Colonoscopy (2009); Carpal tunnel release (Left, 09/25/2017); Tonsillectomy and adenoidectomy (1976); Carpal tunnel release (Right); Polypectomy; Back surgery; and Lumbar laminectomy. Family History:  Her family history includes Colon cancer in her maternal aunt, maternal uncle, and mother; Colon polyps in her cousin and sister; Diabetes in her father and sister; Heart disease in her father; Leukemia in her father; Lymphoma in her father; Skin cancer in her father.  REVIEW OF SYSTEMS  : All other systems reviewed and negative except where noted in the History of Present Illness.  PHYSICAL EXAM: BP 110/78 (BP Location: Left Arm, Patient Position: Sitting, Cuff Size: Large)   Pulse 76   Ht 5' 6.5" (1.689 m) Comment: height measured without shoes  Wt 262 lb 2 oz (118.9 kg)   BMI 41.67 kg/m  Physical Exam   GENERAL APPEARANCE: Obese, in no apparent distress. HEENT: No cervical lymphadenopathy, unremarkable thyroid, sclerae anicteric, conjunctiva pink. RESPIRATORY: Respiratory effort normal, breath sounds equal bilaterally without rales, rhonchi, or wheezing. CARDIO: Regular rate and rhythm with no murmurs, rubs, or gallops, peripheral pulses intact. ABDOMEN: Soft, non-distended, active bowel sounds in all four quadrants, mild tenderness over entire abdomen with increased tenderness in the epigastric region,  no rebound, no mass appreciated. RECTAL: Declines. MUSCULOSKELETAL: Full range of motion, normal gait, without edema. SKIN: Dry, intact without rashes or lesions. No jaundice. NEURO: Alert, oriented, no focal deficits. PSYCH: Cooperative, normal mood and affect.      Doree Albee, PA-C 10:23 AM

## 2024-02-16 NOTE — Progress Notes (Signed)
 I agree with the assessment and plan as outlined by Ms. Steffanie Dunn.

## 2024-02-17 DIAGNOSIS — L299 Pruritus, unspecified: Secondary | ICD-10-CM | POA: Diagnosis not present

## 2024-02-17 DIAGNOSIS — J300 Vasomotor rhinitis: Secondary | ICD-10-CM | POA: Diagnosis not present

## 2024-02-17 DIAGNOSIS — H1045 Other chronic allergic conjunctivitis: Secondary | ICD-10-CM | POA: Diagnosis not present

## 2024-02-25 ENCOUNTER — Ambulatory Visit (HOSPITAL_COMMUNITY)
Admission: RE | Admit: 2024-02-25 | Discharge: 2024-02-25 | Disposition: A | Discharge status: Home / Self Care | Source: Ambulatory Visit | Attending: Physician Assistant | Admitting: Physician Assistant

## 2024-02-25 ENCOUNTER — Ambulatory Visit: Payer: Medicare Other | Admitting: Psychology

## 2024-02-25 DIAGNOSIS — R1319 Other dysphagia: Secondary | ICD-10-CM | POA: Insufficient documentation

## 2024-02-25 DIAGNOSIS — F4310 Post-traumatic stress disorder, unspecified: Secondary | ICD-10-CM

## 2024-02-25 DIAGNOSIS — K224 Dyskinesia of esophagus: Secondary | ICD-10-CM | POA: Diagnosis not present

## 2024-02-25 DIAGNOSIS — R1310 Dysphagia, unspecified: Secondary | ICD-10-CM | POA: Diagnosis not present

## 2024-02-25 DIAGNOSIS — F3341 Major depressive disorder, recurrent, in partial remission: Secondary | ICD-10-CM | POA: Diagnosis not present

## 2024-02-25 DIAGNOSIS — F411 Generalized anxiety disorder: Secondary | ICD-10-CM

## 2024-02-25 MED ORDER — LUBIPROSTONE 24 MCG PO CAPS: 24.0000 ug | ORAL_CAPSULE | Freq: Two times a day (BID) | ORAL | 0 refills | Status: AC

## 2024-02-25 NOTE — Progress Notes (Signed)
 [facility] Counselor/Therapist Progress Note  Patient ID: Paige [name], MRN: [id number],    Date: 02/25/2024  Time Spent: 58 minutes  Time in: 2:02  Time out:3:00  Treatment Type: Individual Therapy  Reported Symptoms: depression and worry  Mental Status Exam: Appearance:  Casual     Behavior: Appropriate  Motor: Normal  Speech/Language:  Normal Rate  Affect: blunted  Mood: sad  Thought process: normal  Thought content:   WNL  Sensory/Perceptual disturbances:   WNL  Orientation: oriented to person, place, time/date, and situation  Attention: Good  Concentration: Good  Memory: WNL  Fund of knowledge:  Good  Insight:   Good  Judgment:  Fair  Impulse Control: Fair   Risk Assessment: Danger to Self:  No Self-injurious Behavior: No Danger to Others: No Duty to Warn:no Physical Aggression / Violence:No  Access to Firearms a concern: No  Gang Involvement:No   Subjective: The patient attended a face-to-face individual therapy session via video visit.  The patient gave verbal consent for this session to be on video on caregility and patient is aware of the limitations of telehealth..  The patient was in her home alone and therapist was in the office alone.  The patient reports that she and Paige [name] went to [place] and had a wonderful time.  They are leaving tomorrow to go to [facility] for 4 days.  We talked about how she is doing and she reports that she has had some sadness and crying.  We talked about this and it seems that she is crying a couple times a week but she is not been able to really state how she is feeling or connect what it is that she is sad about.  Apparently she got sad this week because she was cleaning out some of her father's close and she also does have some anticipatory grief about her mother getting older and not being around at some point.  I encouraged her to definitely connect the dots between what it is that she is sad about and  verbalize that to her partner.  Her partner got on the session with her permission and said that she is not at talking about what is causing her problems.  I explained that I did not want them to over react to this but that I wanted her to be more aware of what is normal to be sad about as opposed to being sad all the time and not really knowing what is causing it.  I encouraged her to work on connecting the dots to what it is that is causing her the sadness and then verbalizing that to [place] and deciding if there is anything that she needs to do about the circumstance.   Interventions: Cognitive Behavioral Therapy  Diagnosis:Recurrent major depressive disorder, in partial remission (HCC)  PTSD (post-traumatic stress disorder)  Generalized anxiety disorder  Plan: Treatment Plan  Strengths/Abilities:  Intelligent, kind, motivated  Treatment Preferences:  Outpatient Individual therapy  Statement of Needs:  "I need some help with getting back out into the world and learn how to interact and be social"   Symptoms:  Describes a reliving of the event, particularly through dissociative flashbacks.:  (Status: improved). Displays a significant decline in interest and engagement in activities.: (Status: improved). Displays significant psychological and/or physiological  distress resulting from internal and external clues that are reminiscent of the traumatic event.:  (Status: improved). Experiences disturbances in sleep.:   (Status: improved). Experiences disturbing and persistent thoughts,  images, and/or perceptions of the  traumatic event.: (Status: improved). Experiences frequent nightmares.:  (Status: improved). Has been exposed to a traumatic event involving actual or  perceived threat of death or serious injury.: (Status: maintained).  Hypervigilance (e.g., feeling constantly on edge, experiencing concentration difficulties, having trouble falling or staying asleep, exhibiting a general state  of irritability).: (Status:  improved). Impairment in social, occupational, or other areas of functioning.:  (Status: improved). Intentionally avoids activities, places, people, or objects (e.g., up-armored vehicles) that evoke memories of the event.:  (Status: improved). Intentionally avoids  thoughts, feelings, or discussions related to the traumatic event.: (Status:  improved). Reports difficulty concentrating as well as feelings of guilt.:   (Status: improved). Reports response of intense fear, helplessness, or horror to the traumatic event.: (Status: improved). Symptoms present more than one month.:  (Status: maintained).    Problems Addressed:  Anxiety, Posttraumatic Stress Disorder (PTSD),  Goals:  LTG:  1. Enhance ability to effectively cope with the full variety of life's worries  and anxieties.  90% 2. No longer avoids persons, places, activities, and objects that are  reminiscent of the traumatic event.  90% 3. No longer experiences intrusive event recollections, avoidance of event  reminders, intense arousal, or disinterest in activities or  relationships.  100% 4. Stabilize anxiety level while increasing ability to function on a daily  basis.  90% 5. Thinks about or openly discusses the traumatic event with others  without experiencing psychological or physiological distress.  80%  STG: 1.Identify and engage in pleasant activities on a daily basis..  90% 2.Identify, challenge, and replace biased, fearful self-talk with positive, realistic, and empowering self talk 3.  Participate in Cognitive Therapy to help identify, challenge, and replace biased, negative, and selfdefeating thoughts resulting from the trauma.  80 % 4.  Participate in Eye Movement Desensitization and Reprocessing (EMDR) to reduce emotional distress  related to traumatic thoughts, feelings, and images.  90%  Target Date:  02/16/2025 Frequency: Every three weeks Modality:Individual Therapy Interventions  by Therapist:  CBT, EMDR, insight oriented therapy, problem solving  Patient approved treatment plan and is progressing nicely.  [name], LCSW

## 2024-03-02 ENCOUNTER — Other Ambulatory Visit: Payer: Self-pay | Admitting: Physician Assistant

## 2024-03-02 DIAGNOSIS — Z01810 Encounter for preprocedural cardiovascular examination: Secondary | ICD-10-CM | POA: Diagnosis not present

## 2024-03-02 DIAGNOSIS — M47816 Spondylosis without myelopathy or radiculopathy, lumbar region: Secondary | ICD-10-CM | POA: Diagnosis not present

## 2024-03-02 DIAGNOSIS — Z01818 Encounter for other preprocedural examination: Secondary | ICD-10-CM | POA: Diagnosis not present

## 2024-03-02 HISTORY — PX: BACK SURGERY: SHX140

## 2024-03-02 MED ORDER — IBSRELA 50 MG PO TABS: 50.0000 mg | ORAL_TABLET | Freq: Two times a day (BID) | ORAL | 3 refills | Status: DC

## 2024-03-02 NOTE — Progress Notes (Signed)
 Patient has failed multiple medications including Amitiza Movantik Linzess has done really well and is probably 50 mg twice daily going to try and send it into the mail order pharmacy and see if we can get this for the patient.  Left samples for patient upfront.

## 2024-03-02 NOTE — Telephone Encounter (Addendum)
 Can we please start PA for Ibsrela 50 mg BID? How much will the patient's co-pay be? Marchelle Folks has already sent the prescription in. Thanks

## 2024-03-03 ENCOUNTER — Other Ambulatory Visit (HOSPITAL_COMMUNITY): Payer: Self-pay

## 2024-03-03 ENCOUNTER — Telehealth: Payer: Self-pay

## 2024-03-03 NOTE — Telephone Encounter (Signed)
 Pharmacy Patient Advocate Encounter   Received notification from Pt Calls Messages that prior authorization for Ibsrela 50MG  tablets is required/requested.   Insurance verification completed.   The patient is insured through [employer] .   Per test claim: PA required; PA submitted to above mentioned insurance via CoverMyMeds Key/confirmation #/EOC [id number] Status is pending

## 2024-03-03 NOTE — Telephone Encounter (Signed)
 [employer] is calling to confirm that the PA for Paige [name] has been approved and that they will be faxing the information over to [place].

## 2024-03-03 NOTE — Telephone Encounter (Signed)
 Pt made aware of the PA that was approved  Pt verbalized understanding with all questions answered.

## 2024-03-03 NOTE — Telephone Encounter (Signed)
 Left message for pt to call back

## 2024-03-04 NOTE — Telephone Encounter (Signed)
 Pharmacy Patient Advocate Encounter  Received notification from [facility] Medicare that Prior Authorization for Ibsrela 50MG  tablets has been APPROVED from 03-03-2024 to 03-03-2025   PA #/Case ID/Reference #: [id number]

## 2024-03-05 DIAGNOSIS — Z1231 Encounter for screening mammogram for malignant neoplasm of breast: Secondary | ICD-10-CM | POA: Diagnosis not present

## 2024-03-08 DIAGNOSIS — Z79890 Hormone replacement therapy (postmenopausal): Secondary | ICD-10-CM | POA: Diagnosis not present

## 2024-03-08 DIAGNOSIS — T85113A Breakdown (mechanical) of implanted electronic neurostimulator, generator, initial encounter: Secondary | ICD-10-CM | POA: Diagnosis not present

## 2024-03-08 DIAGNOSIS — Y831 Surgical operation with implant of artificial internal device as the cause of abnormal reaction of the patient, or of later complication, without mention of misadventure at the time of the procedure: Secondary | ICD-10-CM | POA: Diagnosis not present

## 2024-03-08 DIAGNOSIS — T85193A Other mechanical complication of implanted electronic neurostimulator, generator, initial encounter: Secondary | ICD-10-CM | POA: Diagnosis not present

## 2024-03-08 DIAGNOSIS — Z9109 Other allergy status, other than to drugs and biological substances: Secondary | ICD-10-CM | POA: Diagnosis not present

## 2024-03-08 DIAGNOSIS — Z79899 Other long term (current) drug therapy: Secondary | ICD-10-CM | POA: Diagnosis not present

## 2024-03-08 DIAGNOSIS — M5416 Radiculopathy, lumbar region: Secondary | ICD-10-CM | POA: Diagnosis not present

## 2024-03-08 DIAGNOSIS — G894 Chronic pain syndrome: Secondary | ICD-10-CM | POA: Diagnosis not present

## 2024-03-08 DIAGNOSIS — Z885 Allergy status to narcotic agent status: Secondary | ICD-10-CM | POA: Diagnosis not present

## 2024-03-08 DIAGNOSIS — M961 Postlaminectomy syndrome, not elsewhere classified: Secondary | ICD-10-CM | POA: Diagnosis not present

## 2024-03-08 DIAGNOSIS — Z91014 Allergy to mammalian meats: Secondary | ICD-10-CM | POA: Diagnosis not present

## 2024-03-08 DIAGNOSIS — M4726 Other spondylosis with radiculopathy, lumbar region: Secondary | ICD-10-CM | POA: Diagnosis not present

## 2024-03-08 DIAGNOSIS — Z888 Allergy status to other drugs, medicaments and biological substances status: Secondary | ICD-10-CM | POA: Diagnosis not present

## 2024-03-09 ENCOUNTER — Telehealth: Payer: Medicare Other | Admitting: Adult Health

## 2024-03-09 ENCOUNTER — Encounter: Payer: Self-pay | Admitting: Adult Health

## 2024-03-09 DIAGNOSIS — F339 Major depressive disorder, recurrent, unspecified: Secondary | ICD-10-CM | POA: Diagnosis not present

## 2024-03-09 DIAGNOSIS — J40 Bronchitis, not specified as acute or chronic: Secondary | ICD-10-CM | POA: Diagnosis not present

## 2024-03-09 DIAGNOSIS — G4700 Insomnia, unspecified: Secondary | ICD-10-CM | POA: Diagnosis not present

## 2024-03-09 DIAGNOSIS — F4310 Post-traumatic stress disorder, unspecified: Secondary | ICD-10-CM

## 2024-03-09 DIAGNOSIS — R059 Cough, unspecified: Secondary | ICD-10-CM | POA: Diagnosis not present

## 2024-03-09 DIAGNOSIS — R300 Dysuria: Secondary | ICD-10-CM | POA: Diagnosis not present

## 2024-03-09 DIAGNOSIS — F411 Generalized anxiety disorder: Secondary | ICD-10-CM | POA: Diagnosis not present

## 2024-03-09 DIAGNOSIS — F3341 Major depressive disorder, recurrent, in partial remission: Secondary | ICD-10-CM

## 2024-03-09 DIAGNOSIS — J302 Other seasonal allergic rhinitis: Secondary | ICD-10-CM | POA: Diagnosis not present

## 2024-03-09 MED ORDER — CLONAZEPAM 0.5 MG PO TABS
0.5000 mg | ORAL_TABLET | Freq: Two times a day (BID) | ORAL | 2 refills | Status: DC
Start: 2024-03-09 — End: 2024-06-10

## 2024-03-09 NOTE — Progress Notes (Signed)
 Paige [name] [id number] [birth year] 58 y.o.  Virtual Visit via Video Note  I connected with pt @ on 03/09/24 at  9:00 AM EDT by a video enabled telemedicine application and verified that I am speaking with the correct person using two identifiers.   I discussed the limitations of evaluation and management by telemedicine and the availability of in person appointments. The patient expressed understanding and agreed to proceed.  I discussed the assessment and treatment plan with the patient. The patient was provided an opportunity to ask questions and all were answered. The patient agreed with the plan and demonstrated an understanding of the instructions.   The patient was advised to call back or seek an in-person evaluation if the symptoms worsen or if the condition fails to improve as anticipated.  I provided 25 minutes of non-face-to-face time during this encounter.  The patient was located at home.  The provider was located at [facility].   Dorothyann Gibbs, NP   Subjective:   Patient ID:  Paige [name] is a 58 y.o. (DOB [birth year]) female.  Chief Complaint: No chief complaint on file.   HPI MYRTHA TONKOVICH presents for follow-up of GAD, MDD, PTSD and insomnia.  Describes mood today as "ok". Pleasant. Reports tearfulness - "more so with purging". Mood symptoms - reports decreased depression and anxiety. Reports increased irritability - "more pain related". Denies panic attacks. Reports some worry, rumination and over thinking - "getting hung up on things". Reports mood as lower. Stating "overall, I feel like I'm doing fair". Reports taking medications as prescribed. Continues to see therapist - Bambi Cottle.  Energy levels lower. Active, does not have a regular exercise routine - working with P/T.  Enjoys some usual interests and activities. Lives with partner and cats. Mother local and supportive. Attends church. Appetite adequate. Weight gain - 248 to  258 pounds - 68.5". Sleeps better some nights than others. Reports an average of 6 to 8 hours - "broken sleep". Diagnosed with sleep apnea - using BiPAP machine. Denies daytime napping. Focus and concentration "pretty fair". Completing tasks around the house. Working part time - 6 hours a week. Disabled since 2009. Denies SI or HI.  Denies AH or VH. Denies self harm. Denies substance use.  Previous medication trials: Lexapro, Paxil, Trazadone, Wellbutrin SR, Geodon, Seroquel, Risperdal, Depakote, Topamax, Lithium, Provigil, Ambien, Cytomel, Clonazepam and other benzodiazapines, Elavil, Effexor, Remeron, Prozac, Anafranil, Tofranil, Zoloft, Geodon, Buspar, Adderall, Ritalin, Abilify, Rexulti, Lamictal, Latuda  Review of Systems:  Review of Systems  Musculoskeletal:  Negative for gait problem.  Neurological:  Negative for tremors.  Psychiatric/Behavioral:         Please refer to HPI    Medications: I have reviewed the patient's current medications.  Current Outpatient Medications  Medication Sig Dispense Refill   buPROPion (WELLBUTRIN XL) 150 MG 24 hr tablet TAKE 3 TABLETS EVERY MORNING 270 tablet 3   cariprazine (VRAYLAR) 3 MG capsule Take 1 capsule (3 mg total) by mouth daily. 90 capsule 0   clonazePAM (KLONOPIN) 0.5 MG tablet Take 1 tablet (0.5 mg total) by mouth 2 (two) times daily. 60 tablet 2   dicyclomine (BENTYL) 10 MG capsule Take 1 capsule (10 mg total) by mouth 3 (three) times daily as needed for spasms. 90 capsule 11   docusate sodium (COLACE) 100 MG capsule Take 100 mg by mouth 2 (two) times daily.     DULoxetine (CYMBALTA) 60 MG capsule Take 1 capsule (60 mg total) by mouth 2 (  two) times daily. 180 capsule 3   Erenumab-aooe (AIMOVIG) 70 MG/ML SOAJ INJECT 70 MG INTO THE SKIN EVERY 28 DAYS. 1 mL 11   estradiol (ESTRACE) 0.5 MG tablet Take 0.5 mg by mouth daily.     Fe Fum-Fe Poly-Vit C-Lactobac (FUSION) 65-65-25-30 MG CAPS Take by mouth.     ibuprofen (ADVIL) 800 MG tablet  Take 800 mg by mouth 3 (three) times daily. (Patient not taking: Reported on 02/16/2024)     ipratropium (ATROVENT) 0.06 % nasal spray      levothyroxine (SYNTHROID, LEVOTHROID) 88 MCG tablet Take 88 mcg by mouth daily before breakfast.     liothyronine (CYTOMEL) 5 MCG tablet Take 5 mcg by mouth daily. Monday Wednesday Friday     lubiprostone (AMITIZA) 24 MCG capsule Take 1 capsule (24 mcg total) by mouth 2 (two) times daily with a meal. 180 capsule 0   methylphenidate (RITALIN) 10 MG tablet Take 1 tablet (10 mg total) by mouth 2 (two) times daily. 60 tablet 0   nystatin cream (MYCOSTATIN) Apply 1 application  topically daily as needed for dry skin.     omeprazole (PRILOSEC) 20 MG capsule      ondansetron (ZOFRAN-ODT) 4 MG disintegrating tablet Take 1 tablet (4 mg total) by mouth every 8 (eight) hours as needed for nausea or vomiting. 20 tablet 11   Oxycodone HCl 10 MG TABS Take 10 mg by mouth every 6 (six) hours as needed. (Patient not taking: Reported on 02/16/2024)     oxyCODONE-acetaminophen (PERCOCET) 10-325 MG tablet Take 1 tablet by mouth every 4 (four) hours as needed.     Polyethyl Glycol-Propyl Glycol 0.4-0.3 % SOLN Apply 1 drop to eye as needed.     polyethylene glycol (MIRALAX / GLYCOLAX) 17 g packet Take 17 g by mouth 2 (two) times daily.     potassium chloride (K-DUR,KLOR-CON) 10 MEQ tablet Take 1 tablet by mouth daily.     progesterone (PROMETRIUM) 100 MG capsule Take 100 mg by mouth daily.     promethazine (PHENERGAN) 12.5 MG tablet Take 1 tablet (12.5 mg total) by mouth every 8 (eight) hours as needed for nausea or vomiting. 30 tablet 0   rOPINIRole (REQUIP) 2 MG tablet Take 2 mg by mouth 2 (two) times daily.     Tenapanor HCl (IBSRELA) 50 MG TABS Take 50 mg by mouth 2 (two) times daily before a meal. 60 tablet 3   terconazole (TERAZOL 3) 0.8 % vaginal cream Place 1 applicator vaginally at bedtime.     tiZANidine (ZANAFLEX) 2 MG tablet Take 2 mg by mouth 3 (three) times daily.      tiZANidine (ZANAFLEX) 4 MG tablet Take 4 mg by mouth 2 (two) times daily.     torsemide (DEMADEX) 20 MG tablet Take 20 mg by mouth 2 (two) times daily.     traZODone (DESYREL) 100 MG tablet Take 2 tablets (200 mg total) by mouth at bedtime. 180 tablet 3   Ubrogepant (UBRELVY) 100 MG TABS Take 1 tablet (100 mg total) by mouth daily as needed (at onset of headaches may repeat dose after 2 hours as needed max 200 mg in 24 hours). 16 tablet 11   Current Facility-Administered Medications  Medication Dose Route Frequency Provider Last Rate Last Admin   0.9 %  sodium chloride infusion  500 mL Intravenous Once Meryl Dare, MD        Medication Side Effects: None  Allergies:  Allergies  Allergen Reactions   Cyclobenzaprine Other (  See Comments)    Other reaction(s): Unknown Hallucinations Unknown    Meloxicam Other (See Comments)   Naltrexone Other (See Comments)    SEVERE PAIN ALL OVER   Pork (Porcine) Protein Other (See Comments)    Other reaction(s): Other (See Comments) Migraines Other reaction(s): Other (See Comments) Migraines   Sumatriptan Other (See Comments)    Difficulty breathing Other reaction(s): Other (see comments) Off balance    Tape Dermatitis   Flexeril [Cyclobenzaprine Hcl]    Imitrex [Sumatriptan Base]    Other    Sulfa Antibiotics     Pt states she does not think she is allergic to Sulfa     Past Medical History:  Diagnosis Date   Allergic rhinitis    Allergy    Anemia    Anxiety disorder    Cancer (HCC)    skin   Chronic fatigue    Chronic headaches    Chronic pain    Depression    Endometriosis    Family history of colon cancer    mother,uncles,aunts   Fibromyalgia    Gallstones    GERD (gastroesophageal reflux disease)    Hypothyroidism    IBS (irritable bowel syndrome)    Insomnia    Iron deficiency    Laryngopharyngeal reflux (LPR)    Obesity    Personal history of colonic polyps 04/2006   polypoid   PONV (postoperative nausea  and vomiting)    RLS (restless legs syndrome)    Sleep apnea    no cpap    Family History  Problem Relation Age of Onset   Colon cancer Mother        uncles,aunts   Diabetes Father        paternal grandmother,sister   Leukemia Father    Lymphoma Father    Skin cancer Father    Heart disease Father    Colon polyps Sister    Diabetes Sister    Colon cancer Maternal Aunt    Colon cancer Maternal Uncle    Colon polyps Cousin    Stomach cancer Neg Hx    Esophageal cancer Neg Hx    Rectal cancer Neg Hx     Social History   Socioeconomic History   Marital status: Single    Spouse name: Not on file   Number of children: 0   Years of education: Not on file   Highest education level: Not on file  Occupational History   Occupation: RN/disabled  Tobacco Use   Smoking status: Never   Smokeless tobacco: Never  Vaping Use   Vaping status: Never Used  Substance and Sexual Activity   Alcohol use: Not Currently    Alcohol/week: 0.0 standard drinks of alcohol    Comment: rarely   Drug use: No   Sexual activity: Not on file  Other Topics Concern   Not on file  Social History Narrative   Patient is left-handed. She lives alone in a one level home. She has 3 cats. She is unable to exercise. Caffeine one soda / day. On disability now. 2 bachelor degrees   Social Drivers of Corporate investment banker Strain: Not on file  Food Insecurity: Not on file  Transportation Needs: Not on file  Physical Activity: Not on file  Stress: Not on file  Social Connections: Not on file  Intimate Partner Violence: Not on file    Past Medical History, Surgical history, Social history, and Family history were reviewed and updated as appropriate.  Please see review of systems for further details on the patient's review from today.   Objective:   Physical Exam:  There were no vitals taken for this visit.  Physical Exam Constitutional:      General: She is not in acute  distress. Musculoskeletal:        General: No deformity.  Neurological:     Mental Status: She is alert and oriented to person, place, and time.     Coordination: Coordination normal.  Psychiatric:        Attention and Perception: Attention and perception normal. She does not perceive auditory or visual hallucinations.        Mood and Affect: Mood normal. Mood is not anxious or depressed. Affect is not labile, blunt, angry or inappropriate.        Speech: Speech normal.        Behavior: Behavior normal.        Thought Content: Thought content normal. Thought content is not paranoid or delusional. Thought content does not include homicidal or suicidal ideation. Thought content does not include homicidal or suicidal plan.        Cognition and Memory: Cognition and memory normal.        Judgment: Judgment normal.     Comments: Insight intact     Lab Review:     Component Value Date/Time   NA 140 05/26/2019 1046   K 3.4 (L) 05/26/2019 1046   CL 95 (L) 05/26/2019 1046   CO2 37 (H) 05/26/2019 1046   GLUCOSE 97 05/26/2019 1046   BUN 17 05/26/2019 1046   CREATININE 1.09 05/26/2019 1046   CALCIUM 9.3 05/26/2019 1046   PROT 6.9 05/26/2019 1046   ALBUMIN 4.2 05/26/2019 1046   AST 36 05/26/2019 1046   ALT 30 05/26/2019 1046   ALKPHOS 98 05/26/2019 1046   BILITOT 0.4 05/26/2019 1046       Component Value Date/Time   WBC 6.9 05/26/2019 1046   RBC 4.42 05/26/2019 1046   HGB 12.6 05/26/2019 1046   HCT 38.7 05/26/2019 1046   PLT 209.0 05/26/2019 1046   MCV 87.6 05/26/2019 1046   MCHC 32.5 05/26/2019 1046   RDW 14.6 05/26/2019 1046   LYMPHSABS 2.0 05/26/2019 1046   MONOABS 0.6 05/26/2019 1046   EOSABS 0.1 05/26/2019 1046   BASOSABS 0.0 05/26/2019 1046    No results found for: "POCLITH", "LITHIUM"   No results found for: "PHENYTOIN", "PHENOBARB", "VALPROATE", "CBMZ"   .res Assessment: Plan:    Plan:  1. Wellbutrin XL 450mg  daily - in the am  2. Cymbalta 60mg  BID 3.  Trazadone 100mg  - 2 at hs  4. Vraylar 3mg  daily though patient assistance.  5. Clonazepam 0.5mg  BID - taking at bedtime 6. Ritalin 10mg  BID - needed a prior auth.  Discussed Spravato for TRD symptoms and patient would like to move forward. Will send info to staff for review.   Continue therapy with Bambi Cottle.   RTC 6 weeks  25 minutes spent dedicated to the care of this patient on the date of this encounter to include pre-visit review of records, ordering of medication, post visit documentation, and face-to-face time with the patient discussing GAD, MDD, PTSD and insomnia. Discussed continuing current medication regimen.  Patient advised to contact office with any questions, adverse effects, or acute worsening in signs and symptoms.  Discussed potential metabolic side effects associated with atypical antipsychotics, as well as potential risk for movement side effects. Advised pt to contact office if  movement side effects occur.   Discussed potential benefits, risk, and side effects of benzodiazepines to include potential risk of tolerance and dependence, as well as possible drowsiness. Advised patient not to drive if experiencing drowsiness and to take lowest possible effective dose to minimize risk of dependence and tolerance.   There are no diagnoses linked to this encounter.   Please see After Visit Summary for patient specific instructions.  Future Appointments  Date Time Provider Department Center  03/09/2024  9:00 AM Nykira Reddix, Thereasa Solo, NP [facility] None  03/17/2024 10:00 AM Worthy Rancher, MD [facility] None  03/22/2024 12:30 PM Imogene Burn, MD [facility]  04/08/2024  5:00 PM Cottle, Lynnell Dike, LCSW [facility] None  04/28/2024  2:00 PM Cottle, Bambi G, LCSW [facility] None  05/18/2024 10:20 AM Marguerita Beards, MD [facility]  05/26/2024  2:00 PM Cottle, Lynnell Dike, LCSW [facility] None  09/13/2024  1:50 PM Everlena Cooper, Adam R, DO [facility] None    No orders of the defined types  were placed in this encounter.     -------------------------------

## 2024-03-10 DIAGNOSIS — R0602 Shortness of breath: Secondary | ICD-10-CM | POA: Diagnosis not present

## 2024-03-10 DIAGNOSIS — J209 Acute bronchitis, unspecified: Secondary | ICD-10-CM | POA: Diagnosis not present

## 2024-03-10 DIAGNOSIS — R059 Cough, unspecified: Secondary | ICD-10-CM | POA: Diagnosis not present

## 2024-03-12 ENCOUNTER — Encounter: Payer: Self-pay | Admitting: Internal Medicine

## 2024-03-17 ENCOUNTER — Ambulatory Visit (INDEPENDENT_AMBULATORY_CARE_PROVIDER_SITE_OTHER): Payer: Self-pay | Admitting: Internal Medicine

## 2024-03-17 ENCOUNTER — Encounter (INDEPENDENT_AMBULATORY_CARE_PROVIDER_SITE_OTHER): Payer: Self-pay | Admitting: Internal Medicine

## 2024-03-17 VITALS — BP 124/91 | HR 105 | Temp 98.6°F | Ht 67.0 in | Wt 251.0 lb

## 2024-03-17 DIAGNOSIS — Z6839 Body mass index (BMI) 39.0-39.9, adult: Secondary | ICD-10-CM

## 2024-03-17 DIAGNOSIS — E639 Nutritional deficiency, unspecified: Secondary | ICD-10-CM | POA: Diagnosis not present

## 2024-03-17 DIAGNOSIS — E66812 Obesity, class 2: Secondary | ICD-10-CM

## 2024-03-17 DIAGNOSIS — B370 Candidal stomatitis: Secondary | ICD-10-CM | POA: Diagnosis not present

## 2024-03-17 DIAGNOSIS — R059 Cough, unspecified: Secondary | ICD-10-CM | POA: Diagnosis not present

## 2024-03-17 DIAGNOSIS — R062 Wheezing: Secondary | ICD-10-CM | POA: Diagnosis not present

## 2024-03-17 DIAGNOSIS — G4733 Obstructive sleep apnea (adult) (pediatric): Secondary | ICD-10-CM

## 2024-03-17 DIAGNOSIS — Z9884 Bariatric surgery status: Secondary | ICD-10-CM

## 2024-03-17 NOTE — Progress Notes (Signed)
 Office: [phone]  /  Fax: [phone]   Initial Visit  Paige [name] was seen in clinic today to evaluate for obesity. She is interested in losing weight to improve overall health and reduce the risk of weight related complications. She presents today to review program treatment options, initial physical assessment, and evaluation.     Discussed the use of AI scribe software for clinical note transcription with the patient, who gave verbal consent to proceed.   History of Present Illness   Paige [name] "[name]" is a 58 year old female who presents for an introductory medical visit for weight management.  She is accompanied by her boyfriend.  She has struggled with weight issues since childhood, describing herself as a 'chubby kid' even before the age of [age]. Significant weight gain occurred after starting antidepressants in college, including Paxil and other medications. She has been on various medications for mental health, which she believes have contributed to her weight gain.  In 2005, she underwent Roux-en-Y bariatric surgery with a presurgical weight of 316 pounds, reaching a lowest weight of 176 pounds over several years. However, she experienced weight regain over time and did not have follow-up care post-surgery. She currently takes a multivitamin gummy and an iron supplement but is unsure if her vitamin levels are regularly checked.  She has moderate obstructive sleep apnea, initially treated with CPAP but now managed with BiPAP, which has improved her sleep to 6-8 hours per night. Despite this, her sleep is often fragmented, and she wakes up after 3-4 hours, sometimes due to the need to use the bathroom.  Her history of scoliosis and back surgery limits her physical activity. She used to walk frequently and would like to return to that level of activity. She consumes some processed foods but tries to avoid fast foods, with her partner assisting in meal  preparation.  Her family history includes obesity, with her sister and other family members being overweight. She also reports a history of mental health issues in the family.  Current medications include clonazepam, Vraylar, duloxetine, and trazodone, with regular use of pain medications, all of which may contribute to weight gain. She has not previously used medications specifically for weight loss.      Contributing factors: Family history of obesity, Disruption of circadian rhythm / sleep disordered breathing, Consumption of processed foods, Use of obesogenic medications: Psychotropic medications, Moderate to high levels of stress, Reduced physical activity, Mental health problems, and Menopause  Weight promoting medications identified: Psychotropic medications, Anticholinergics, and Other: opiates  Current nutrition plan: None  Current level of physical activity: Limited due to chronic pain or orthopedic problems  Current or previous pharmacotherapy: None and Is interested in pharmacotherapy  Response to medication: Never tried medications   Past medical history includes:   Past Medical History:  Diagnosis Date   Allergic rhinitis    Allergy    Anemia    Anxiety disorder    Cancer (HCC)    skin   Chronic fatigue    Chronic headaches    Chronic pain    Depression    Endometriosis    Family history of colon cancer    mother,uncles,aunts   Fibromyalgia    Gallstones    GERD (gastroesophageal reflux disease)    Hypothyroidism    IBS (irritable bowel syndrome)    Insomnia    Iron deficiency    Laryngopharyngeal reflux (LPR)    Obesity    Personal history of colonic polyps 04/2006  polypoid   PONV (postoperative nausea and vomiting)    RLS (restless legs syndrome)    Sleep apnea    no cpap     Objective:   BP (!) 124/91   Pulse (!) 105   Temp 98.6 F (37 C)   Ht 5\' 7"  (1.702 m)   Wt 251 lb (113.9 kg)   SpO2 96%   BMI 39.31 kg/m  She was weighed on the  bioimpedance scale: Body mass index is 39.31 kg/m.  Peak Weight: 316 , Body Fat%:49, Visceral Fat Rating:15, Weight trend over the last 12 months: Increasing  General:  Alert, oriented and cooperative. Patient is in no acute distress.  Respiratory: Normal respiratory effort, no problems with respiration noted   Gait: able to ambulate independently  Mental Status: Normal mood and affect. Normal behavior. Normal judgment and thought content.   DIAGNOSTIC DATA REVIEWED:  BMET    Component Value Date/Time   NA 140 05/26/2019 1046   K 3.4 (L) 05/26/2019 1046   CL 95 (L) 05/26/2019 1046   CO2 37 (H) 05/26/2019 1046   GLUCOSE 97 05/26/2019 1046   BUN 17 05/26/2019 1046   CREATININE 1.09 05/26/2019 1046   CALCIUM 9.3 05/26/2019 1046   No results found for: "HGBA1C" No results found for: "INSULIN" CBC    Component Value Date/Time   WBC 6.9 05/26/2019 1046   RBC 4.42 05/26/2019 1046   HGB 12.6 05/26/2019 1046   HCT 38.7 05/26/2019 1046   PLT 209.0 05/26/2019 1046   MCV 87.6 05/26/2019 1046   MCHC 32.5 05/26/2019 1046   RDW 14.6 05/26/2019 1046   Iron/TIBC/Ferritin/ %Sat    Component Value Date/Time   IRON 33 (L) 05/26/2019 1046   FERRITIN 12.4 05/26/2019 1046   IRONPCTSAT 6.6 (L) 05/26/2019 1046   Lipid Panel  No results found for: "CHOL", "TRIG", "HDL", "CHOLHDL", "VLDL", "LDLCALC", "LDLDIRECT" Hepatic Function Panel     Component Value Date/Time   PROT 6.9 05/26/2019 1046   ALBUMIN 4.2 05/26/2019 1046   AST 36 05/26/2019 1046   ALT 30 05/26/2019 1046   ALKPHOS 98 05/26/2019 1046   BILITOT 0.4 05/26/2019 1046   BILIDIR 0.1 05/12/2017 1222      Component Value Date/Time   TSH 1.72 05/26/2019 1046     Assessment and Plan:   OSA on BiPAP Assessment & Plan: She reports having sleep apnea of moderate severity, we will review sleep study results.  She may be a candidate for GLP-1 which may also help offset some of the weight gain associated with psychotropic  medication that she is on.  Losing 15% of body weight may reduce AHI.   Class 2 severe obesity with serious comorbidity and body mass index (BMI) of 39.0 to 39.9 in adult, unspecified obesity type [facility]) Assessment & Plan: Patient has complex obesity with a history of Roux-en-Y and weight regain on several weight promoting psychotropic medication.  She also has impaired mobility due to orthopedic problems and therefore low volume of physical activity.  She also has sleep apnea which may affect energy regulation and hunger hormones.  We reviewed some of the challenges associated with these and expectations when it comes to weight loss.  We reviewed anthropometrics, biometrics, associated medical conditions and contributing factors with patient. she would benefit from a medically tailored reduced calorie nutrional plan based on her REE (resting energy expenditure), which will be determined by indirect calorimetry.  We will also assess for cardiometabolic risk and nutritional derangements via fasting  labs at intake appointment.     History of Roux-en-Y gastric bypass Assessment & Plan: 2005, , pre-surgical weight 316, 176 nadir after several years, weight regain doesn't know onset   Nutritional deficiency Assessment & Plan: She has a history of Roux-en-Y, and has known B12, iron and vitamin D deficiency, I did not see other vitamin levels which are usually checked once a year.  She is also not taking a bariatric vitamin but may be taking individual vitamins and a gummy.  I would like to check baseline vitamin levels probably at her third visit and after holding vitamins for 2 days.  That way we could obtain a baseline and determine if she benefits from a bariatric vitamin.  These vitamin deficiencies may cause slowing of metabolic rate and also lead to other deficiencies in mental health problems worsening.     Assessment and Plan        Obesity Treatment / Action Plan:  Patient  will work on garnering support from family and friends to begin weight loss journey. Will work on eliminating or reducing the presence of highly palatable, calorie dense foods in the home. Will complete provided nutritional and psychosocial assessment questionnaire before the next appointment. Will be scheduled for indirect calorimetry to determine resting energy expenditure in a fasting state.  This will allow us  to create a reduced calorie, high-protein meal plan to promote loss of fat mass while preserving muscle mass. Counseled on the health benefits of losing 5%-15% of total body weight. Was counseled on nutritional approaches to weight loss and benefits of reducing processed foods and consuming plant-based foods and high quality protein as part of nutritional weight management. Was counseled on pharmacotherapy and role as an adjunct in weight management.   Obesity Education Performed Today:  She was weighed on the bioimpedance scale and results were discussed and documented in the synopsis.  We discussed obesity as a disease and the importance of a more detailed evaluation of all the factors contributing to the disease.  We discussed the importance of long term lifestyle changes which include nutrition, exercise and behavioral modifications as well as the importance of customizing this to her specific health and social needs.  We discussed the benefits of reaching a healthier weight to alleviate the symptoms of existing conditions and reduce the risks of the biomechanical, metabolic and psychological effects of obesity.  Nicholette Barley appears to be in the action stage of change and states they are ready to start intensive lifestyle modifications and behavioral modifications.  I have spent 40 minutes in the care of the patient today including: 1 minutes before the visit reviewing and prepping the chart 30 minutes face-to-face assessing and reviewing listed medical problems as  outlined in obesity care plan, providing nutritional and behavioral counseling as outlined in obesity care plan, independently interpreting results and goals of care, see listed medical problems, discussing biometric information and progress, and reviewing pertinent diagnostics which are listed under medical problems 9 minutes after the visit updating chart and documentation    Reviewed by clinician on day of visit: allergies, medications, problem list, medical history, surgical history, family history, social history, and previous encounter notes pertinent to obesity diagnosis.   Ladd Picker, MD

## 2024-03-17 NOTE — Assessment & Plan Note (Signed)
 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  She also has impaired mobility due to orthopedic problems and therefore low volume of physical activity.  She also has sleep apnea which may affect energy regulation and hunger hormones.  We reviewed some of the challenges associated with these and expectations when it comes to weight loss.  We reviewed anthropometrics, biometrics, associated medical conditions and contributing factors with patient. she would benefit from a medically tailored reduced calorie nutrional plan based on her REE (resting energy expenditure), which will be determined by indirect calorimetry.  We will also assess for cardiometabolic risk and nutritional derangements via fasting labs at intake appointment.

## 2024-03-17 NOTE — Assessment & Plan Note (Addendum)
 2005, [place], pre-surgical weight 316, 176 nadir after several years, weight regain doesn't know onset

## 2024-03-17 NOTE — Assessment & Plan Note (Signed)
 She reports having sleep apnea of moderate severity, we will review sleep study results.  She may be a candidate for GLP-1 which may also help offset some of the weight gain associated with psychotropic medication that she is on.  Losing 15% of body weight may reduce AHI.

## 2024-03-17 NOTE — Assessment & Plan Note (Addendum)
 She has a history of Roux-en-Y, and has known B12, iron and vitamin D deficiency, I did not see other vitamin levels which are usually checked once a year.  She is also not taking a bariatric vitamin but may be taking individual vitamins and a gummy.  I would like to check baseline vitamin levels probably at her third visit and after holding vitamins for 2 days.  That way we could obtain a baseline and determine if she benefits from a bariatric vitamin.  These vitamin deficiencies may cause slowing of metabolic rate and also lead to other deficiencies in mental health problems worsening.

## 2024-03-22 ENCOUNTER — Encounter: Payer: Self-pay | Admitting: Internal Medicine

## 2024-03-22 ENCOUNTER — Ambulatory Visit: Admitting: Internal Medicine

## 2024-03-22 VITALS — BP 120/61 | HR 70 | Temp 97.4°F | Resp 16 | Ht 66.5 in | Wt 262.0 lb

## 2024-03-22 DIAGNOSIS — K2289 Other specified disease of esophagus: Secondary | ICD-10-CM

## 2024-03-22 DIAGNOSIS — R1319 Other dysphagia: Secondary | ICD-10-CM

## 2024-03-22 DIAGNOSIS — R1310 Dysphagia, unspecified: Secondary | ICD-10-CM

## 2024-03-22 DIAGNOSIS — K3189 Other diseases of stomach and duodenum: Secondary | ICD-10-CM

## 2024-03-22 DIAGNOSIS — K229 Disease of esophagus, unspecified: Secondary | ICD-10-CM | POA: Diagnosis not present

## 2024-03-22 MED ORDER — SODIUM CHLORIDE 0.9 % IV SOLN: 500.0000 mL | Freq: Once | INTRAVENOUS | Status: DC

## 2024-03-22 NOTE — Progress Notes (Signed)
 Called to room to assist during endoscopic procedure.  Patient ID and intended procedure confirmed with present staff. Received instructions for my participation in the procedure from the performing physician.

## 2024-03-22 NOTE — Patient Instructions (Addendum)
 Discharge patient to home (with escort).                           - Await pathology results.                           - Return to GI clinic in 2-3 months.                           - The findings and recommendations were discussed                            with the patient. Return to normal diet, first meal soft foods.  Patient to call and schedule follow up appointment , she needs to look at calendar at home.     YOU HAD AN ENDOSCOPIC PROCEDURE TODAY AT THE [facility]:   Refer to the procedure report that was given to you for any specific questions about what was found during the examination.  If the procedure report does not answer your questions, please call your gastroenterologist to clarify.  If you requested that your care partner not be given the details of your procedure findings, then the procedure report has been included in a sealed envelope for you to review at your convenience later.  YOU SHOULD EXPECT: Some feelings of bloating in the abdomen. Passage of more gas than usual.  Walking can help get rid of the air that was put into your GI tract during the procedure and reduce the bloating. If you had a lower endoscopy (such as a colonoscopy or flexible sigmoidoscopy) you may notice spotting of blood in your stool or on the toilet paper. If you underwent a bowel prep for your procedure, you may not have a normal bowel movement for a few days.  Please Note:  You might notice some irritation and congestion in your nose or some drainage.  This is from the oxygen  used during your procedure.  There is no need for concern and it should clear up in a day or so.  SYMPTOMS TO REPORT IMMEDIATELY:   Following upper endoscopy (EGD)  Vomiting of blood or coffee ground material  New chest pain or pain under the shoulder blades  Painful or persistently difficult swallowing  New shortness of breath  Fever of 100F or higher  Black, tarry-looking stools  For urgent or emergent  issues, a gastroenterologist can be reached at any hour by calling [phone]. Do not use MyChart messaging for urgent concerns.    DIET:  We do recommend a small meal at first, but then you may proceed to your regular diet.  Drink plenty of fluids but you should avoid alcoholic beverages for 24 hours.  ACTIVITY:  You should plan to take it easy for the rest of today and you should NOT DRIVE or use heavy machinery until tomorrow (because of the sedation medicines used during the test).    FOLLOW UP: Our staff will call the number listed on your records the next business day following your procedure.  We will call around 7:15- 8:00 am to check on you and address any questions or concerns that you may have regarding the information given to you following your procedure. If we do not reach you, we will leave a message.     If any biopsies  were taken you will be contacted by phone or by letter within the next 1-3 weeks.  Please call us  at [phone] if you have not heard about the biopsies in 3 weeks.    SIGNATURES/CONFIDENTIALITY: You and/or your care partner have signed paperwork which will be entered into your electronic medical record.  These signatures attest to the fact that that the information above on your After Visit Summary has been reviewed and is understood.  Full responsibility of the confidentiality of this discharge information lies with you and/or your care-partner.

## 2024-03-22 NOTE — Progress Notes (Signed)
 GASTROENTEROLOGY PROCEDURE H&P NOTE   Primary Care Physician: Olan Bering, MD    Reason for Procedure:   Dysphagia, history of RYGB, GERD  Plan:    EGD  Patient is appropriate for endoscopic procedure(s) in the ambulatory ([facility]) setting.  The nature of the procedure, as well as the risks, benefits, and alternatives were carefully and thoroughly reviewed with the patient. Ample time for discussion and questions allowed. The patient understood, was satisfied, and agreed to proceed.     HPI: Paige [name] is a 58 y.o. female who presents for EGD for evaluation of dysphagia, history of RYGB, GERD.  Patient was most recently seen in the Gastroenterology Clinic on 02/16/24.  No interval change in medical history since that appointment. Please refer to that note for full details regarding GI history and clinical presentation.   Past Medical History:  Diagnosis Date   Allergic rhinitis    Allergy    Anemia    Anxiety disorder    Bronchitis    Cancer (HCC)    skin   Chronic fatigue    Chronic headaches    Chronic pain    Depression    Endometriosis    Family history of colon cancer    mother,uncles,aunts   Fibromyalgia    Gallstones    GERD (gastroesophageal reflux disease)    Hypothyroidism    IBS (irritable bowel syndrome)    Insomnia    Iron  deficiency    Laryngopharyngeal reflux (LPR)    Obesity    Personal history of colonic polyps 04/2006   polypoid   PONV (postoperative nausea and vomiting)    RLS (restless legs syndrome)    Sleep apnea    no cpap    Past Surgical History:  Procedure Laterality Date   ANTERIOR CRUCIATE LIGAMENT REPAIR Left 2003   APPENDECTOMY  1993   BACK SURGERY     spinal cord stimulator placed   CARPAL TUNNEL RELEASE Left 09/25/2017   Procedure: LEFT CARPAL TUNNEL RELEASE;  Surgeon: Lyanne Sample, MD;  Location: [facility];  Service: Orthopedics;  Laterality: Left;   CARPAL TUNNEL RELEASE Right     CHOLECYSTECTOMY  1999   COLONOSCOPY  2009   [facility]   LUMBAR LAMINECTOMY     POLYPECTOMY     REPAIR ANKLE LIGAMENT Left 2001   ROUX-EN-Y GASTRIC BYPASS  04/2004   Dr Jacolyn Matar   TONSILLECTOMY AND ADENOIDECTOMY  1976    Prior to Admission medications   Medication Sig Start Date End Date Taking? Authorizing Provider  albuterol (VENTOLIN HFA) 108 (90 Base) MCG/ACT inhaler Inhale into the lungs. 03/11/24  Yes [provider]  buPROPion  (WELLBUTRIN  XL) 150 MG 24 hr tablet TAKE 3 TABLETS EVERY MORNING 01/27/24  Yes Mozingo, Regina Nattalie, NP  cariprazine  (VRAYLAR ) 3 MG capsule Take 1 capsule (3 mg total) by mouth daily. 01/27/24  Yes Mozingo, Regina Nattalie, NP  clonazePAM  (KLONOPIN ) 0.5 MG tablet Take 1 tablet (0.5 mg total) by mouth 2 (two) times daily. 03/09/24  Yes Mozingo, Regina Nattalie, NP  docusate sodium  (COLACE) 100 MG capsule Take 100 mg by mouth 2 (two) times daily.   Yes [provider]  DULoxetine  (CYMBALTA ) 60 MG capsule Take 1 capsule (60 mg total) by mouth 2 (two) times daily. 12/16/23  Yes Mozingo, Regina Nattalie, NP  estradiol  (ESTRACE ) 0.5 MG tablet Take 0.5 mg by mouth daily. 01/23/24  Yes [provider]  Fe Fum-Fe Poly-Vit C-Lactobac (FUSION) 65-65-25-30 MG CAPS Take by  mouth.   Yes [provider]  fluticasone (FLONASE SENSIMIST) 27.5 MCG/SPRAY nasal spray 2 sprays (1 spray in each nostril) Nasally Once a day during seasons of difficulty for 30 days 02/17/24  Yes [provider]  ipratropium (ATROVENT) 0.06 % nasal spray  08/20/19  Yes [provider]  levothyroxine  (SYNTHROID , LEVOTHROID) 88 MCG tablet Take 88 mcg by mouth daily before breakfast.   Yes [provider]  liothyronine (CYTOMEL) 5 MCG tablet Take 5 mcg by mouth daily. Monday Wednesday Friday 07/27/21  Yes [provider]  lubiprostone  (AMITIZA ) 24 MCG capsule Take 1 capsule (24 mcg total) by mouth 2 (two) times daily with a meal. 02/25/24  05/25/24 Yes Edmonia Gottron, PA-C  MAGNESIUM CITRATE PO Magnesium Citrate   Yes [provider]  methylphenidate  (RITALIN ) 10 MG tablet Take 1 tablet (10 mg total) by mouth 2 (two) times daily. 01/22/24  Yes Mozingo, Regina Nattalie, NP  Multiple Vitamins-Minerals (BARIATRIC FUSION PO) Bariatric Fusion   Yes [provider]  Olopatadine HCl 0.2 % SOLN 1 drop into affected eye Ophthalmic Once a day or prn for 30 days 02/17/24  Yes [provider]  omeprazole  (PRILOSEC) 20 MG capsule  09/25/19  Yes [provider]  Oxycodone  HCl 10 MG TABS Take 10 mg by mouth every 6 (six) hours as needed. 01/10/20  Yes [provider]  potassium chloride  (K-DUR,KLOR-CON ) 10 MEQ tablet Take 1 tablet by mouth daily. 03/04/18  Yes [provider]  progesterone  (PROMETRIUM ) 100 MG capsule Take 100 mg by mouth daily. 01/28/24  Yes [provider]  Propylene Glycol (SYSTANE COMPLETE) 0.6 % SOLN Apply 2 drops to eye.   Yes [provider]  rOPINIRole  (REQUIP ) 2 MG tablet Take 2 mg by mouth 2 (two) times daily.   Yes [provider]  Tenapanor HCl (IBSRELA ) 50 MG TABS Take 50 mg by mouth 2 (two) times daily before a meal. 03/02/24  Yes Santina Cull R, PA-C  tiZANidine (ZANAFLEX) 4 MG tablet Take 4 mg by mouth 2 (two) times daily. 01/16/24  Yes [provider]  torsemide (DEMADEX) 20 MG tablet Take 20 mg by mouth 2 (two) times daily. 12/22/19  Yes [provider]  traZODone  (DESYREL ) 100 MG tablet Take 2 tablets (200 mg total) by mouth at bedtime. 12/16/23  Yes Mozingo, Regina Nattalie, NP  dicyclomine  (BENTYL ) 10 MG capsule Take 1 capsule (10 mg total) by mouth 3 (three) times daily as needed for spasms. 07/13/19   Asencion Blacksmith, MD  Erenumab -aooe (AIMOVIG ) 70 MG/ML SOAJ INJECT 70 MG INTO THE SKIN EVERY 28 DAYS. 09/10/23   Festus Hubert, Adam R, DO  ibuprofen (ADVIL) 800 MG tablet Take 800 mg by mouth 3 (three) times daily. 12/29/23    [provider]  nystatin cream (MYCOSTATIN) Apply 1 application  topically daily as needed for dry skin. Patient not taking: Reported on 03/22/2024    [provider]  ondansetron  (ZOFRAN -ODT) 4 MG disintegrating tablet Take 1 tablet (4 mg total) by mouth every 8 (eight) hours as needed for nausea or vomiting. 09/10/23   Merriam Abbey, DO  Polyethyl Glycol-Propyl Glycol 0.4-0.3 % SOLN Apply 1 drop to eye as needed.    [provider]  polyethylene glycol (MIRALAX / GLYCOLAX) 17 g packet Take 17 g by mouth 2 (two) times daily.    [provider]  promethazine  (PHENERGAN ) 12.5 MG tablet Take 1 tablet (12.5 mg total) by mouth every 8 (eight) hours as  needed for nausea or vomiting. 05/26/19   Asencion Blacksmith, MD  terconazole (TERAZOL 3) 0.8 % vaginal cream Place 1 applicator vaginally at bedtime.    [provider]  Ubrogepant  (UBRELVY ) 100 MG TABS Take 1 tablet (100 mg total) by mouth daily as needed (at onset of headaches may repeat dose after 2 hours as needed max 200 mg in 24 hours). 09/10/23   Merriam Abbey, DO    Current Outpatient Medications  Medication Sig Dispense Refill   albuterol (VENTOLIN HFA) 108 (90 Base) MCG/ACT inhaler Inhale into the lungs.     buPROPion  (WELLBUTRIN  XL) 150 MG 24 hr tablet TAKE 3 TABLETS EVERY MORNING 270 tablet 3   cariprazine  (VRAYLAR ) 3 MG capsule Take 1 capsule (3 mg total) by mouth daily. 90 capsule 0   clonazePAM  (KLONOPIN ) 0.5 MG tablet Take 1 tablet (0.5 mg total) by mouth 2 (two) times daily. 60 tablet 2   docusate sodium  (COLACE) 100 MG capsule Take 100 mg by mouth 2 (two) times daily.     DULoxetine  (CYMBALTA ) 60 MG capsule Take 1 capsule (60 mg total) by mouth 2 (two) times daily. 180 capsule 3   estradiol  (ESTRACE ) 0.5 MG tablet Take 0.5 mg by mouth daily.     Fe Fum-Fe Poly-Vit C-Lactobac (FUSION) 65-65-25-30 MG CAPS Take by mouth.     fluticasone (FLONASE SENSIMIST) 27.5 MCG/SPRAY nasal spray 2 sprays (1  spray in each nostril) Nasally Once a day during seasons of difficulty for 30 days     ipratropium (ATROVENT) 0.06 % nasal spray      levothyroxine  (SYNTHROID , LEVOTHROID) 88 MCG tablet Take 88 mcg by mouth daily before breakfast.     liothyronine (CYTOMEL) 5 MCG tablet Take 5 mcg by mouth daily. Monday Wednesday Friday     lubiprostone  (AMITIZA ) 24 MCG capsule Take 1 capsule (24 mcg total) by mouth 2 (two) times daily with a meal. 180 capsule 0   MAGNESIUM CITRATE PO Magnesium Citrate     methylphenidate  (RITALIN ) 10 MG tablet Take 1 tablet (10 mg total) by mouth 2 (two) times daily. 60 tablet 0   Multiple Vitamins-Minerals (BARIATRIC FUSION PO) Bariatric Fusion     Olopatadine HCl 0.2 % SOLN 1 drop into affected eye Ophthalmic Once a day or prn for 30 days     omeprazole  (PRILOSEC) 20 MG capsule      Oxycodone  HCl 10 MG TABS Take 10 mg by mouth every 6 (six) hours as needed.     potassium chloride  (K-DUR,KLOR-CON ) 10 MEQ tablet Take 1 tablet by mouth daily.     progesterone  (PROMETRIUM ) 100 MG capsule Take 100 mg by mouth daily.     Propylene Glycol (SYSTANE COMPLETE) 0.6 % SOLN Apply 2 drops to eye.     rOPINIRole  (REQUIP ) 2 MG tablet Take 2 mg by mouth 2 (two) times daily.     Tenapanor HCl (IBSRELA ) 50 MG TABS Take 50 mg by mouth 2 (two) times daily before a meal. 60 tablet 3   tiZANidine (ZANAFLEX) 4 MG tablet Take 4 mg by mouth 2 (two) times daily.     torsemide (DEMADEX) 20 MG tablet Take 20 mg by mouth 2 (two) times daily.     traZODone  (DESYREL ) 100 MG tablet Take 2 tablets (200 mg total) by mouth at bedtime. 180 tablet 3   dicyclomine  (BENTYL ) 10 MG capsule Take 1 capsule (10 mg total) by mouth 3 (three) times daily as needed for spasms. 90 capsule 11  Erenumab -aooe (AIMOVIG ) 70 MG/ML SOAJ INJECT 70 MG INTO THE SKIN EVERY 28 DAYS. 1 mL 11   ibuprofen (ADVIL) 800 MG tablet Take 800 mg by mouth 3 (three) times daily.     nystatin cream (MYCOSTATIN) Apply 1 application  topically daily  as needed for dry skin. (Patient not taking: Reported on 03/22/2024)     ondansetron  (ZOFRAN -ODT) 4 MG disintegrating tablet Take 1 tablet (4 mg total) by mouth every 8 (eight) hours as needed for nausea or vomiting. 20 tablet 11   Polyethyl Glycol-Propyl Glycol 0.4-0.3 % SOLN Apply 1 drop to eye as needed.     polyethylene glycol (MIRALAX / GLYCOLAX) 17 g packet Take 17 g by mouth 2 (two) times daily.     promethazine  (PHENERGAN ) 12.5 MG tablet Take 1 tablet (12.5 mg total) by mouth every 8 (eight) hours as needed for nausea or vomiting. 30 tablet 0   terconazole (TERAZOL 3) 0.8 % vaginal cream Place 1 applicator vaginally at bedtime.     Ubrogepant  (UBRELVY ) 100 MG TABS Take 1 tablet (100 mg total) by mouth daily as needed (at onset of headaches may repeat dose after 2 hours as needed max 200 mg in 24 hours). 16 tablet 11   Current Facility-Administered Medications  Medication Dose Route Frequency Provider Last Rate Last Admin   0.9 %  sodium chloride  infusion  500 mL Intravenous Once Daina Drum, MD        Allergies as of 03/22/2024 - Review Complete 03/17/2024  Allergen Reaction Noted   Tape Dermatitis and Itching 10/16/2018   Topiramate  Other (See Comments) and Shortness Of Breath 04/21/2017   Cyclobenzaprine Other (See Comments) 06/10/2011   Flexeril [cyclobenzaprine hcl] Other (See Comments) 06/10/2011   Meloxicam Other (See Comments) 09/22/2017   Naltrexone Other (See Comments) 05/20/2019   Pork (porcine) protein Other (See Comments) 05/22/2016   Sumatriptan Other (See Comments) 06/10/2011   Wound dressing adhesive Other (See Comments) 03/02/2024   Brexpiprazole Other (See Comments) 03/02/2024   Glycopyrrolate  Other (See Comments) 03/01/2024   Imitrex [sumatriptan base] Other (See Comments) 06/10/2011   Lamotrigine  Other (See Comments) 03/01/2024   Sulfa antibiotics Other (See Comments) 11/06/2020    Family History  Problem Relation Age of Onset   Colon cancer Mother         uncles,aunts   Diabetes Father        paternal grandmother,sister   Leukemia Father    Lymphoma Father    Skin cancer Father    Heart disease Father    Colon polyps Sister    Diabetes Sister    Colon cancer Maternal Aunt    Colon cancer Maternal Uncle    Colon cancer Cousin    Colon polyps Cousin    Stomach cancer Neg Hx    Esophageal cancer Neg Hx    Rectal cancer Neg Hx     Social History   Socioeconomic History   Marital status: Single    Spouse name: Not on file   Number of children: 0   Years of education: Not on file   Highest education level: Not on file  Occupational History   Occupation: RN/disabled  Tobacco Use   Smoking status: Never   Smokeless tobacco: Never  Vaping Use   Vaping status: Never Used  Substance and Sexual Activity   Alcohol  use: Not Currently    Alcohol /week: 0.0 standard drinks of alcohol     Comment: rarely   Drug use: No   Sexual activity: Not on  file  Other Topics Concern   Not on file  Social History Narrative   Patient is left-handed. She lives alone in a one level home. She has 3 cats. She is unable to exercise. Caffeine one soda / day. On disability now. 2 bachelor degrees   Social Drivers of Corporate investment banker Strain: Not on file  Food Insecurity: Not on file  Transportation Needs: Not on file  Physical Activity: Not on file  Stress: Not on file  Social Connections: Not on file  Intimate Partner Violence: Not on file    Physical Exam: Vital signs in last 24 hours: BP 110/67   Pulse 78   Temp (!) 97.4 F (36.3 C) (Temporal)   Ht 5' 6.5" (1.689 m)   Wt 262 lb (118.8 kg)   SpO2 95%   BMI 41.65 kg/m  GEN: NAD EYE: Sclerae anicteric ENT: MMM CV: Non-tachycardic Pulm: No increased WOB GI: Soft NEURO:  Alert & Oriented   Regino Caprio, MD [facility]   03/22/2024 1:27 PM

## 2024-03-22 NOTE — Op Note (Signed)
 [facility] Patient Name: Paige [name] Procedure Date: 03/22/2024 12:40 PM MRN: [id number] Endoscopist: Freada Jacobs [place] , , [id number] Age: 58 Referring MD:  Date of Birth: [birth year] Gender: Female Account #: [id number] Procedure:                Upper GI endoscopy Indications:              Dysphagia Medicines:                Monitored Anesthesia Care Procedure:                Pre-Anesthesia Assessment:                           - Prior to the procedure, a History and Physical                            was performed, and patient medications and                            allergies were reviewed. The patient's tolerance of                            previous anesthesia was also reviewed. The risks                            and benefits of the procedure and the sedation                            options and risks were discussed with the patient.                            All questions were answered, and informed consent                            was obtained. Prior Anticoagulants: The patient has                            taken no anticoagulant or antiplatelet agents                            except for aspirin. ASA Grade Assessment: II - A                            patient with mild systemic disease. After reviewing                            the risks and benefits, the patient was deemed in                            satisfactory condition to undergo the procedure.                           After obtaining informed consent, the endoscope was  passed under direct vision. Throughout the                            procedure, the patient's blood pressure, pulse, and                            oxygen  saturations were monitored continuously. The                            GIF [id number] #[id number] was introduced through the                            mouth, and advanced to the jejunum. The upper GI                            endoscopy was  accomplished without difficulty. The                            patient tolerated the procedure well. Scope In: Scope Out: Findings:                 The examined esophagus was normal. A guidewire was                            placed and the scope was withdrawn. Dilation was                            performed with a Savary dilator with mild                            resistance at 19 mm. Biopsies were taken with a                            cold forceps for histology.                           A single 6 mm sessile polyp with no bleeding and no                            stigmata of recent bleeding was found in the                            gastric body. The polyp was removed with a cold                            snare. Resection and retrieval were complete.                           Evidence of a Roux-en-Y gastrojejunostomy was                            found. The gastrojejunal anastomosis was  characterized by healthy appearing mucosa. This was                            traversed.                           The examined jejunum was normal. Complications:            No immediate complications. Estimated Blood Loss:     Estimated blood loss was minimal. Impression:               - Normal esophagus. Dilated. Biopsied.                           - A single gastric polyp. Resected and retrieved.                           - Roux-en-Y gastrojejunostomy with gastrojejunal                            anastomosis characterized by healthy appearing                            mucosa.                           - Normal examined jejunum. Recommendation:           - Discharge patient to home (with escort).                           - Await pathology results.                           - Return to GI clinic in 2-3 months.                           - The findings and recommendations were discussed                            with the patient. Dr Pedro Bourgeois "Anastacio Balm"  Rosaline Coma,  03/22/2024 1:53:48 PM

## 2024-03-22 NOTE — Progress Notes (Signed)
 Report to PACU, RN, vss, BBS= Clear.

## 2024-03-23 ENCOUNTER — Telehealth: Payer: Self-pay

## 2024-03-23 NOTE — Telephone Encounter (Signed)
 Follow up call to pt, lm for pt to call if having any difficulty with normal activities or eating and drinking.  Also to call if any other questions or concerns.

## 2024-03-25 LAB — SURGICAL PATHOLOGY

## 2024-03-26 ENCOUNTER — Encounter: Payer: Self-pay | Admitting: Internal Medicine

## 2024-03-30 DIAGNOSIS — K581 Irritable bowel syndrome with constipation: Secondary | ICD-10-CM

## 2024-03-30 MED ORDER — IBSRELA 50 MG PO TABS: 50.0000 mg | ORAL_TABLET | Freq: Two times a day (BID) | ORAL | 3 refills | Status: DC

## 2024-03-30 NOTE — Telephone Encounter (Signed)
 I have not received any communication from [facility]. Called [facility] at [phone]. I was advised that they needed an ICD 10 code, provided code for IBS with constipation. They asked that we send a new order with updated diagnoses code. They will have patient access manager contact patient later this evening or tomorrow. It will take 3-5 business days for patient to get the medication. Patient notified via MyChart.

## 2024-03-31 ENCOUNTER — Ambulatory Visit: Payer: Medicare HMO | Admitting: Psychology

## 2024-04-06 DIAGNOSIS — G894 Chronic pain syndrome: Secondary | ICD-10-CM | POA: Diagnosis not present

## 2024-04-08 ENCOUNTER — Ambulatory Visit: Admitting: Psychology

## 2024-04-08 DIAGNOSIS — F3341 Major depressive disorder, recurrent, in partial remission: Secondary | ICD-10-CM | POA: Diagnosis not present

## 2024-04-08 DIAGNOSIS — F4310 Post-traumatic stress disorder, unspecified: Secondary | ICD-10-CM

## 2024-04-08 DIAGNOSIS — F411 Generalized anxiety disorder: Secondary | ICD-10-CM

## 2024-04-08 DIAGNOSIS — Z0289 Encounter for other administrative examinations: Secondary | ICD-10-CM

## 2024-04-08 NOTE — Progress Notes (Signed)
 [facility] Counselor/Therapist Progress Note  Patient ID: Paige [name], MRN: [id number],    Date: 04/08/2024  Time Spent: 57 minutes  Time in: 5:01  Time out:5:58  Treatment Type: Individual Therapy  Reported Symptoms: depression and worry  Mental Status Exam: Appearance:  Casual     Behavior: Appropriate  Motor: Normal  Speech/Language:  Normal Rate  Affect: blunted  Mood: sad  Thought process: normal  Thought content:   WNL  Sensory/Perceptual disturbances:   WNL  Orientation: oriented to person, place, time/date, and situation  Attention: Good  Concentration: Good  Memory: WNL  Fund of knowledge:  Good  Insight:   Good  Judgment:  Fair  Impulse Control: Fair   Risk Assessment: Danger to Self:  No Self-injurious Behavior: No Danger to Others: No Duty to Warn:no Physical Aggression / Violence:No  Access to Firearms a concern: No  Gang Involvement:No   Subjective: The patient attended a face-to-face individual therapy session via video visit.  The patient gave verbal consent for this session to be on video on caregility and patient is aware of the limitations of telehealth..  The patient was in her home alone and therapist was in the office alone.  The patient got on the session and immediately started talking about what she was unhappy with.  She stated that she is not happy with her house because it is cluttered and she started talking about things that were going wrong.  I worked with her to start looking at the thoughts that she is having and how to change her thoughts to neutral or positive.  We talked about her feeling worse when she is negative about what is going on.  I encouraged her to be more aware of the things that are going well for her as opposed to the things that are not going well.  She reports that she and Cleora Daft are continuing to do okay in the relationship and she has been able to spend some more time with her mother which I think has  been helpful to her.  Both she and Cleora Daft have birthdays this month and she reports that they are planning on doing some fun things in some way over the months to celebrate.  I encouraged her to catch herself when she is talking so that she can change her thoughts to something more neutral or positive.  She does have a PET scan because she had some suspicious growth in her lungs.  We talked about staying in the now in regard that and we will continue to work with her on having more awareness of how she is feeling, talking about those feelings and reframing.  Interventions: Cognitive Behavioral Therapy  Diagnosis:Recurrent major depressive disorder, in partial remission (HCC)  Generalized anxiety disorder  PTSD (post-traumatic stress disorder)  Plan: Treatment Plan  Strengths/Abilities:  Intelligent, kind, motivated  Treatment Preferences:  Outpatient Individual therapy  Statement of Needs:  "I need some help with getting back out into the world and learn how to interact and be social"   Symptoms:  Describes a reliving of the event, particularly through dissociative flashbacks.:  (Status: improved). Displays a significant decline in interest and engagement in activities.: (Status: improved). Displays significant psychological and/or physiological  distress resulting from internal and external clues that are reminiscent of the traumatic event.:  (Status: improved). Experiences disturbances in sleep.:   (Status: improved). Experiences disturbing and persistent thoughts, images, and/or perceptions of the  traumatic event.: (Status: improved). Experiences frequent nightmares.:  (  Status: improved). Has been exposed to a traumatic event involving actual or  perceived threat of death or serious injury.: (Status: maintained).  Hypervigilance (e.g., feeling constantly on edge, experiencing concentration difficulties, having trouble falling or staying asleep, exhibiting a general state of  irritability).: (Status:  improved). Impairment in social, occupational, or other areas of functioning.:  (Status: improved). Intentionally avoids activities, places, people, or objects (e.g., up-armored vehicles) that evoke memories of the event.:  (Status: improved). Intentionally avoids  thoughts, feelings, or discussions related to the traumatic event.: (Status:  improved). Reports difficulty concentrating as well as feelings of guilt.:   (Status: improved). Reports response of intense fear, helplessness, or horror to the traumatic event.: (Status: improved). Symptoms present more than one month.:  (Status: maintained).    Problems Addressed:  Anxiety, Posttraumatic Stress Disorder (PTSD),  Goals:  LTG:  1. Enhance ability to effectively cope with the full variety of life's worries  and anxieties.  90% 2. No longer avoids persons, places, activities, and objects that are  reminiscent of the traumatic event.  90% 3. No longer experiences intrusive event recollections, avoidance of event  reminders, intense arousal, or disinterest in activities or  relationships.  100% 4. Stabilize anxiety level while increasing ability to function on a daily  basis.  90% 5. Thinks about or openly discusses the traumatic event with others  without experiencing psychological or physiological distress.  80%  STG: 1.Identify and engage in pleasant activities on a daily basis..  90% 2.Identify, challenge, and replace biased, fearful self-talk with positive, realistic, and empowering self talk 3.  Participate in Cognitive Therapy to help identify, challenge, and replace biased, negative, and selfdefeating thoughts resulting from the trauma.  80 % 4.  Participate in Eye Movement Desensitization and Reprocessing (EMDR) to reduce emotional distress  related to traumatic thoughts, feelings, and images.  90%  Target Date:  02/16/2025 Frequency: Every three weeks Modality:Individual Therapy Interventions by  Therapist:  CBT, EMDR, insight oriented therapy, problem solving  Patient approved treatment plan and is progressing nicely.  Sary Bogie G Margit Batte, LCSW

## 2024-04-13 DIAGNOSIS — H25813 Combined forms of age-related cataract, bilateral: Secondary | ICD-10-CM | POA: Diagnosis not present

## 2024-04-13 DIAGNOSIS — H31092 Other chorioretinal scars, left eye: Secondary | ICD-10-CM | POA: Diagnosis not present

## 2024-04-13 DIAGNOSIS — H1045 Other chronic allergic conjunctivitis: Secondary | ICD-10-CM | POA: Diagnosis not present

## 2024-04-13 DIAGNOSIS — H04123 Dry eye syndrome of bilateral lacrimal glands: Secondary | ICD-10-CM | POA: Diagnosis not present

## 2024-04-14 DIAGNOSIS — R918 Other nonspecific abnormal finding of lung field: Secondary | ICD-10-CM | POA: Diagnosis not present

## 2024-04-20 ENCOUNTER — Telehealth (INDEPENDENT_AMBULATORY_CARE_PROVIDER_SITE_OTHER): Admitting: Adult Health

## 2024-04-20 ENCOUNTER — Encounter: Payer: Self-pay | Admitting: Adult Health

## 2024-04-20 DIAGNOSIS — G4700 Insomnia, unspecified: Secondary | ICD-10-CM | POA: Diagnosis not present

## 2024-04-20 DIAGNOSIS — F411 Generalized anxiety disorder: Secondary | ICD-10-CM

## 2024-04-20 DIAGNOSIS — F339 Major depressive disorder, recurrent, unspecified: Secondary | ICD-10-CM | POA: Diagnosis not present

## 2024-04-20 DIAGNOSIS — G4733 Obstructive sleep apnea (adult) (pediatric): Secondary | ICD-10-CM | POA: Diagnosis not present

## 2024-04-20 DIAGNOSIS — F902 Attention-deficit hyperactivity disorder, combined type: Secondary | ICD-10-CM

## 2024-04-20 DIAGNOSIS — F3341 Major depressive disorder, recurrent, in partial remission: Secondary | ICD-10-CM

## 2024-04-20 DIAGNOSIS — F4310 Post-traumatic stress disorder, unspecified: Secondary | ICD-10-CM

## 2024-04-20 MED ORDER — METHYLPHENIDATE HCL 10 MG PO TABS: 10.0000 mg | ORAL_TABLET | Freq: Two times a day (BID) | ORAL | 0 refills | Status: DC

## 2024-04-20 NOTE — Progress Notes (Signed)
 Paige [name] [id number] 1965/12/22 58 y.o.  Virtual Visit via Video Note  I connected with pt @ on 04/20/24 at  9:30 AM EDT by a video enabled telemedicine application and verified that I am speaking with the correct person using two identifiers.   I discussed the limitations of evaluation and management by telemedicine and the availability of in person appointments. The patient expressed understanding and agreed to proceed.  I discussed the assessment and treatment plan with the patient. The patient was provided an opportunity to ask questions and all were answered. The patient agreed with the plan and demonstrated an understanding of the instructions.   The patient was advised to call back or seek an in-person evaluation if the symptoms worsen or if the condition fails to improve as anticipated.  I provided 25 minutes of non-face-to-face time during this encounter.  The patient was located at home.  The provider was located at [facility].   Reagan Camera, NP   Subjective:   Patient ID:  Paige [name] is a 58 y.o. (DOB 1966/09/27) female.  Chief Complaint: No chief complaint on file.   HPI Paige [name] presents for follow-up of  GAD, MDD, PTSD and insomnia.  Describes mood today as "ok". Pleasant. Reports tappropriate tearfulness. Mood symptoms - reports depression, irritability and anxiety. Reports  chronic pain effecting mood.  Denies panic attacks. Reports some worry, rumination and over thinking . Reports mood as lower. Stating ""I feel a little better than fair".  Reports taking medications as prescribed. Continues to see therapist  every 4 weeks- - Bambi Cottle.  Energy levels lower. - "terrible". Active, does not have a regular exercise routine - working with P/T.  Enjoys some usual interests and activities. Lives with partner and cats. Mother local and supportive. Attends church. Appetite adequate. Weight gain - 248 to 250 pounds-  68.5". Sleeps better some nights than others. Reports an average of 4 to 6 hours - "broken sleep". Diagnosed with sleep apnea - using BiPAP machine. Denies daytime napping. Reports recent issues with  focus and concentration  - over of the past few weeks. Completing tasks around the house. Working part time - 6 hours a week. Disabled since 2009. Denies SI or HI.  Denies AH or VH. Denies self harm. Denies substance use.  Previous medication trials: Lexapro, Paxil, Trazadone, Wellbutrin  SR, Geodon, Seroquel, Risperdal, Depakote, Topamax , Lithium, Provigil, Ambien, Cytomel, Clonazepam  and other benzodiazapines, Elavil, Effexor, Remeron, Prozac, Anafranil, Tofranil, Zoloft, Geodon, Buspar, Adderall, Ritalin , Abilify, Rexulti, Lamictal , Latuda  Review of Systems:  Review of Systems  Musculoskeletal:  Negative for gait problem.  Neurological:  Negative for tremors.  Psychiatric/Behavioral:         Please refer to HPI    Medications: I have reviewed the patient's current medications.  Current Outpatient Medications  Medication Sig Dispense Refill   albuterol (VENTOLIN HFA) 108 (90 Base) MCG/ACT inhaler Inhale into the lungs.     buPROPion  (WELLBUTRIN  XL) 150 MG 24 hr tablet TAKE 3 TABLETS EVERY MORNING 270 tablet 3   cariprazine  (VRAYLAR ) 3 MG capsule Take 1 capsule (3 mg total) by mouth daily. 90 capsule 0   clonazePAM  (KLONOPIN ) 0.5 MG tablet Take 1 tablet (0.5 mg total) by mouth 2 (two) times daily. 60 tablet 2   dicyclomine  (BENTYL ) 10 MG capsule Take 1 capsule (10 mg total) by mouth 3 (three) times daily as needed for spasms. 90 capsule 11   docusate sodium  (COLACE) 100 MG capsule Take 100  mg by mouth 2 (two) times daily.     DULoxetine  (CYMBALTA ) 60 MG capsule Take 1 capsule (60 mg total) by mouth 2 (two) times daily. 180 capsule 3   Erenumab -aooe (AIMOVIG ) 70 MG/ML SOAJ INJECT 70 MG INTO THE SKIN EVERY 28 DAYS. 1 mL 11   estradiol  (ESTRACE ) 0.5 MG tablet Take 0.5 mg by mouth daily.      Fe Fum-Fe Poly-Vit C-Lactobac (FUSION) 65-65-25-30 MG CAPS Take by mouth.     fluticasone (FLONASE SENSIMIST) 27.5 MCG/SPRAY nasal spray 2 sprays (1 spray in each nostril) Nasally Once a day during seasons of difficulty for 30 days     ibuprofen (ADVIL) 800 MG tablet Take 800 mg by mouth 3 (three) times daily.     ipratropium (ATROVENT) 0.06 % nasal spray      levothyroxine  (SYNTHROID , LEVOTHROID) 88 MCG tablet Take 88 mcg by mouth daily before breakfast.     liothyronine (CYTOMEL) 5 MCG tablet Take 5 mcg by mouth daily. Monday Wednesday Friday     lubiprostone  (AMITIZA ) 24 MCG capsule Take 1 capsule (24 mcg total) by mouth 2 (two) times daily with a meal. 180 capsule 0   MAGNESIUM CITRATE PO Magnesium Citrate     methylphenidate  (RITALIN ) 10 MG tablet Take 1 tablet (10 mg total) by mouth 2 (two) times daily. 60 tablet 0   Multiple Vitamins-Minerals (BARIATRIC FUSION PO) Bariatric Fusion     nystatin cream (MYCOSTATIN) Apply 1 application  topically daily as needed for dry skin. (Patient not taking: Reported on 03/22/2024)     Olopatadine HCl 0.2 % SOLN 1 drop into affected eye Ophthalmic Once a day or prn for 30 days     omeprazole  (PRILOSEC) 20 MG capsule      ondansetron  (ZOFRAN -ODT) 4 MG disintegrating tablet Take 1 tablet (4 mg total) by mouth every 8 (eight) hours as needed for nausea or vomiting. 20 tablet 11   Oxycodone  HCl 10 MG TABS Take 10 mg by mouth every 6 (six) hours as needed.     Polyethyl Glycol-Propyl Glycol 0.4-0.3 % SOLN Apply 1 drop to eye as needed.     polyethylene glycol (MIRALAX / GLYCOLAX) 17 g packet Take 17 g by mouth 2 (two) times daily.     potassium chloride  (K-DUR,KLOR-CON ) 10 MEQ tablet Take 1 tablet by mouth daily.     progesterone  (PROMETRIUM ) 100 MG capsule Take 100 mg by mouth daily.     promethazine  (PHENERGAN ) 12.5 MG tablet Take 1 tablet (12.5 mg total) by mouth every 8 (eight) hours as needed for nausea or vomiting. 30 tablet 0   Propylene Glycol (SYSTANE  COMPLETE) 0.6 % SOLN Apply 2 drops to eye.     rOPINIRole  (REQUIP ) 2 MG tablet Take 2 mg by mouth 2 (two) times daily.     Tenapanor HCl (IBSRELA ) 50 MG TABS Take 50 mg by mouth 2 (two) times daily before a meal. 60 tablet 3   terconazole (TERAZOL 3) 0.8 % vaginal cream Place 1 applicator vaginally at bedtime.     tiZANidine (ZANAFLEX) 4 MG tablet Take 4 mg by mouth 2 (two) times daily.     torsemide (DEMADEX) 20 MG tablet Take 20 mg by mouth 2 (two) times daily.     traZODone  (DESYREL ) 100 MG tablet Take 2 tablets (200 mg total) by mouth at bedtime. 180 tablet 3   Ubrogepant  (UBRELVY ) 100 MG TABS Take 1 tablet (100 mg total) by mouth daily as needed (at onset of headaches may repeat  dose after 2 hours as needed max 200 mg in 24 hours). 16 tablet 11   No current facility-administered medications for this visit.    Medication Side Effects: None  Allergies:  Allergies  Allergen Reactions   Tape Dermatitis and Itching    BURNING AND BLISTERING - USUALLY DOES OK WITH TEGADERM  Paper tape or Coban OK   Topiramate  Other (See Comments) and Shortness Of Breath    Really wierd feeling  Off balance; cloudy thinking  Really wierd feeling Off balance; cloudy thinking    Off balance; cloudy thinking   Cyclobenzaprine Other (See Comments)    Other reaction(s): Unknown Hallucinations Unknown    Flexeril [Cyclobenzaprine Hcl] Other (See Comments)    hallucinations   Meloxicam Other (See Comments)   Naltrexone Other (See Comments)    SEVERE PAIN ALL OVER   Pork (Porcine) Protein Other (See Comments)    Other reaction(s): Other (See Comments) Migraines Other reaction(s): Other (See Comments) Migraines   Sumatriptan Other (See Comments)    Difficulty breathing Other reaction(s): Other (see comments) Off balance    Wound Dressing Adhesive Other (See Comments)    Tape - Burns and blisters; caused skin infection   Brexpiprazole Other (See Comments)    Rexulti     Restless arms    Glycopyrrolate  Other (See Comments)    Urinary retention   Imitrex [Sumatriptan Base] Other (See Comments)    Pt unsure of allergic reaction/intolerance   Lamotrigine  Other (See Comments)    Pt unsure of allergy/intolerance   Sulfa Antibiotics Other (See Comments)    Pt states she does not think she is allergic to Sulfa     Past Medical History:  Diagnosis Date   Allergic rhinitis    Allergy    Anemia    Anxiety disorder    Bronchitis    Cancer (HCC)    skin   Chronic fatigue    Chronic headaches    Chronic pain    Depression    Endometriosis    Family history of colon cancer    mother,uncles,aunts   Fibromyalgia    Gallstones    GERD (gastroesophageal reflux disease)    Hypothyroidism    IBS (irritable bowel syndrome)    Insomnia    Iron  deficiency    Laryngopharyngeal reflux (LPR)    Obesity    Personal history of colonic polyps 04/2006   polypoid   PONV (postoperative nausea and vomiting)    RLS (restless legs syndrome)    Sleep apnea    no cpap    Family History  Problem Relation Age of Onset   Colon cancer Mother        uncles,aunts   Diabetes Father        paternal grandmother,sister   Leukemia Father    Lymphoma Father    Skin cancer Father    Heart disease Father    Colon polyps Sister    Diabetes Sister    Colon cancer Maternal Aunt    Colon cancer Maternal Uncle    Colon cancer Cousin    Colon polyps Cousin    Stomach cancer Neg Hx    Esophageal cancer Neg Hx    Rectal cancer Neg Hx     Social History   Socioeconomic History   Marital status: Single    Spouse name: Not on file   Number of children: 0   Years of education: Not on file   Highest education level: Not on file  Occupational  History   Occupation: RN/disabled  Tobacco Use   Smoking status: Never   Smokeless tobacco: Never  Vaping Use   Vaping status: Never Used  Substance and Sexual Activity   Alcohol  use: Not Currently    Alcohol /week: 0.0 standard drinks of alcohol      Comment: rarely   Drug use: No   Sexual activity: Not on file  Other Topics Concern   Not on file  Social History Narrative   Patient is left-handed. She lives alone in a one level home. She has 3 cats. She is unable to exercise. Caffeine one soda / day. On disability now. 2 bachelor degrees   Social Drivers of Health   Financial Resource Strain: Low Risk  (04/06/2024)   [facility]   Overall Financial Resource Strain (CARDIA)    Difficulty of Paying Living Expenses: Not hard at all  Food Insecurity: No Food Insecurity (04/06/2024)   Received from [facility]   Hunger Vital Sign    Worried About Running Out of Food in the Last Year: Never true    Ran Out of Food in the Last Year: Never true  Transportation Needs: No Transportation Needs (04/06/2024)   Received from [facility] - [facility]    Lack of Transportation (Medical): No    Lack of Transportation (Non-Medical): No  Physical Activity: Not on file  Stress: Not on file  Social Connections: Not on file  Intimate Partner Violence: Not on file    Past Medical History, Surgical history, Social history, and Family history were reviewed and updated as appropriate.   Please see review of systems for further details on the patient's review from today.   Objective:   Physical Exam:  There were no vitals taken for this visit.  Physical Exam Constitutional:      General: She is not in acute distress. Musculoskeletal:        General: No deformity.  Neurological:     Mental Status: She is alert and oriented to person, place, and time.     Coordination: Coordination normal.  Psychiatric:        Attention and Perception: Attention and perception normal. She does not perceive auditory or visual hallucinations.        Mood and Affect: Mood normal. Mood is not anxious or depressed. Affect is not labile, blunt, angry or inappropriate.        Speech: Speech normal.        Behavior: Behavior normal.         Thought Content: Thought content normal. Thought content is not paranoid or delusional. Thought content does not include homicidal or suicidal ideation. Thought content does not include homicidal or suicidal plan.        Cognition and Memory: Cognition and memory normal.        Judgment: Judgment normal.     Comments: Insight intact     Lab Review:     Component Value Date/Time   NA 140 05/26/2019 1046   K 3.4 (L) 05/26/2019 1046   CL 95 (L) 05/26/2019 1046   CO2 37 (H) 05/26/2019 1046   GLUCOSE 97 05/26/2019 1046   BUN 17 05/26/2019 1046   CREATININE 1.09 05/26/2019 1046   CALCIUM 9.3 05/26/2019 1046   PROT 6.9 05/26/2019 1046   ALBUMIN 4.2 05/26/2019 1046   AST 36 05/26/2019 1046   ALT 30 05/26/2019 1046   ALKPHOS 98 05/26/2019 1046   BILITOT 0.4 05/26/2019 1046  Component Value Date/Time   WBC 6.9 05/26/2019 1046   RBC 4.42 05/26/2019 1046   HGB 12.6 05/26/2019 1046   HCT 38.7 05/26/2019 1046   PLT 209.0 05/26/2019 1046   MCV 87.6 05/26/2019 1046   MCHC 32.5 05/26/2019 1046   RDW 14.6 05/26/2019 1046   LYMPHSABS 2.0 05/26/2019 1046   MONOABS 0.6 05/26/2019 1046   EOSABS 0.1 05/26/2019 1046   BASOSABS 0.0 05/26/2019 1046    No results found for: "POCLITH", "LITHIUM"   No results found for: "PHENYTOIN", "PHENOBARB", "VALPROATE", "CBMZ"   .res Assessment: Plan:    Plan:  1. Wellbutrin  XL 450mg  daily - in the am  2. Cymbalta  60mg  BID 3. Trazadone 100mg  - 2 at hs  4. Vraylar  3mg  daily though patient assistance.  5. Clonazepam  0.5mg  BID - taking at bedtime 6. Ritalin  10mg  BID   Continue therapy with Bambi Cottle.   RTC 6 weeks  25 minutes spent dedicated to the care of this patient on the date of this encounter to include pre-visit review of records, ordering of medication, post visit documentation, and face-to-face time with the patient discussing GAD, MDD, PTSD and insomnia. Discussed continuing current medication regimen.  Patient advised to  contact office with any questions, adverse effects, or acute worsening in signs and symptoms.  Discussed potential metabolic side effects associated with atypical antipsychotics, as well as potential risk for movement side effects. Advised pt to contact office if movement side effects occur.   Discussed potential benefits, risk, and side effects of benzodiazepines to include potential risk of tolerance and dependence, as well as possible drowsiness. Advised patient not to drive if experiencing drowsiness and to take lowest possible effective dose to minimize risk of dependence and tolerance.    There are no diagnoses linked to this encounter.   Please see After Visit Summary for patient specific instructions.  Future Appointments  Date Time Provider Department Center  04/20/2024  9:30 AM Anahy Esh, Ursula Gardner, NP [facility] None  04/28/2024  2:00 PM Cottle, Tom Found, LCSW [facility] None  05/13/2024  2:15 PM Diamond Formica, MD [facility] None  05/18/2024 10:20 AM Arma Lamp, MD [facility]  05/25/2024 10:20 AM Marceil Sensor, DO [facility] None  05/26/2024  2:00 PM Cottle, Tom Found, LCSW [facility] None  06/08/2024 11:00 AM Marceil Sensor, DO [facility] None  09/13/2024  1:50 PM Festus Hubert, Adam R, DO [facility] None    No orders of the defined types were placed in this encounter.     -------------------------------

## 2024-04-27 DIAGNOSIS — G4733 Obstructive sleep apnea (adult) (pediatric): Secondary | ICD-10-CM | POA: Diagnosis not present

## 2024-04-28 ENCOUNTER — Ambulatory Visit: Payer: Medicare Other | Admitting: Psychology

## 2024-04-28 DIAGNOSIS — F4310 Post-traumatic stress disorder, unspecified: Secondary | ICD-10-CM

## 2024-04-28 DIAGNOSIS — F902 Attention-deficit hyperactivity disorder, combined type: Secondary | ICD-10-CM

## 2024-04-28 DIAGNOSIS — F3341 Major depressive disorder, recurrent, in partial remission: Secondary | ICD-10-CM

## 2024-04-28 NOTE — Progress Notes (Unsigned)
                edge,  experiencing concentration difficulties, having trouble falling or staying asleep, exhibiting a general  state of irritability).: No Description Entered (Status: improved). Motor tension (e.g., restlessness,  tiredness, shakiness, muscle tension).: No Description Entered (Status: improved).  Problems Addressed  Anxiety, Phase Of Life Problems, Anxiety  Goals 1. Learn and implement coping skills that result in a reduction of anxiety  and worry, and improved daily functioning. Objective Learn  and implement calming skills to reduce overall anxiety and manage anxiety symptoms. Target Date: 2025-08-09Frequency: Weekly Progress: 40 Modality: individual  Related Interventions 1. Teach the client calming/relaxation skills (e.g., applied relaxation, progressive muscle  relaxation, cue controlled relaxation; mindful breathing; biofeedback) and how to discriminate  better between relaxation and tension; teach the client how to apply these skills to his/her daily  life (e.g., New Directions in Progressive Muscle Relaxation by Marcelyn Ditty, and  Hazlett-Stevens; Treating Generalized Anxiety Disorder by Rygh and Ida Rogue). Objective Identify, challenge, and replace biased, fearful self-talk with positive, realistic, and empowering selftalk. Target Date: 2024-07-10 Frequency: weekly Progress: 30 Modality: individual Related Interventions 1. Explore the client's schema and self-talk that mediate his/her fear response; assist him/her in  challenging the biases; replace the distorted messages with reality-based alternatives and  positive, realistic self-talk that will increase his/her self-confidence in coping with irrational  fears (see Cognitive Therapy of Anxiety Disorders by Laurence Slate). Objective Learn and implement problem-solving strategies for realistically addressing worries. Target Date: 2025-08-09Frequency: weekly Progress: 40 Modality: individual 2. Resolve conflicted feelings and adapt to the new life circumstances. Objective Apply problem-solving skills to current circumstances. Target Date: 2024-07-10 Frequency: weekly Progress: 20 Modality: individual Related Interventions 1. Teach the client problem-resolution skills (e.g., defining the problem clearly, brainstorming  multiple solutions, listing the pros and cons of each solution, seeking input from others,  selecting and implementing a plan of action, evaluating outcome, and readjusting plan as   necessary).   3. Stabilize anxiety level while increasing ability to function on a daily  basis. Diagnosis F33.1  Major depressive disorder, moderate 300.02 (Generalized anxiety disorder) - Open - [Signifier: n/a]  Axis  none 309.28 (Adjustment disorder with mixed anxiety and depressed  mood) - Open - [Signifier: n/a]  Adjustment Disorder,  With Anxiety   Marital conflict  Major Depressive disorder, moderate  Conditions For Discharge Achievement of treatment goals and objectives.  The patient approved this plan.   Deonna Krummel G Ethridge Sollenberger, LCSW

## 2024-05-13 ENCOUNTER — Ambulatory Visit: Admitting: Internal Medicine

## 2024-05-13 ENCOUNTER — Encounter: Payer: Self-pay | Admitting: Internal Medicine

## 2024-05-13 VITALS — BP 126/74 | HR 73 | Temp 97.7°F | Ht 67.0 in | Wt 265.6 lb

## 2024-05-13 DIAGNOSIS — Z6841 Body Mass Index (BMI) 40.0 and over, adult: Secondary | ICD-10-CM | POA: Diagnosis not present

## 2024-05-13 DIAGNOSIS — R053 Chronic cough: Secondary | ICD-10-CM

## 2024-05-13 MED ORDER — AIRSUPRA 90-80 MCG/ACT IN AERO: 2.0000 | INHALATION_SPRAY | RESPIRATORY_TRACT | 11 refills | Status: DC | PRN

## 2024-05-13 NOTE — Patient Instructions (Addendum)
 Try airsupra  up 2 puffs every 4 hours as needed cough wheeze short of breath    Prilosec 20 mg Take 30- 60 min before your first and last meals of the day    If not better happy to call in gabapentin  100 mg  at bedtime with maximum of 1200 mg per day

## 2024-05-13 NOTE — Progress Notes (Signed)
 Subjective:     Patient ID: Paige [name], female   DOB: November 28, 1966,    MRN: [id number]  HPI  43 yowf  Never smoker with lots of ear infections as child and always near or at the end of a group of children with any kind of physical activity but due to fibromyalgias went on to [employer] mostly OR /some ICU work but depression /disablility in 2008 eval by [facility] in [place] x around 2015/6 for cough with neg allergy eval that but referred to pulmonary clinic 05/23/2016 by Dr Kathyrn Parkinson p neg w/u for cough by ENT   05/23/2016 1st [facility] office visit/ Bedie Dominey   Chief Complaint  Patient presents with   Pulmonary Consult    Referred by Dr. Olan Bering. Pt c/o cough and SOB since 01/30/16. Cough is nonprod and occurs day or night with no specific trigger. She states that she gets winded walking from one room to the next not all the time. She occ has trouble with swallowing.    cough is mostly daytime/ mostly dry and assoc with sensation of choking No better on albuterol neb just makes her nervous  Already on omeprazole  40 mg Take 30-60 min before first meal of the day and rantidine at hs and Loestrin Still has sensation of throat irritation and drainage but no excess/ purulent mucus production Rec Continue the acid suppression as you are Consider stopping the Loestrin which makes the reflux worse Try not to cough or clear your throat if at all possible  GERD diet reviewed, bed blocks rec  If not improved will need to consider trial of gabapentin  - 100 mg three times a day or see Dr Jerelene Monday (or refer to [facility] or speech path  [place] neg allergy    05/13/2024  Reestablish  ov/Raesha Coonrod re: dry cough since childhood  maint on  no resp rx x prn albuterol seems to help   Dyspnea:  fine if not coughing  Cough: daytime doesn't wake her up, reproducible with drinking  cold water  Sleeping: 20 degrees  2 pillows and bipap  per paruchuri  Much worse late March 2025 sick contact  > severe procutive cough > ER March 10 2024 pred/cough syrup some better    No obvious other patterns in day to day or daytime variability or assoc   mucus plugs or hemoptysis or cp or chest tightness, subjective wheeze or overt sinus or hb symptoms.    Also denies any obvious fluctuation of symptoms with weather or environmental changes or other aggravating or alleviating factors except as outlined above   No unusual exposure hx or h/o childhood pna/ asthma or knowledge of premature birth.  Current Allergies, Complete Past Medical History, Past Surgical History, Family History, and Social History were reviewed in [facility] record.  ROS  The following are not active complaints unless bolded Hoarseness, sore throat, dysphagia, dental problems, itching, sneezing,  nasal congestion or discharge of excess mucus or purulent secretions, ear ache,   fever, chills, sweats, unintended wt loss or wt gain, classically pleuritic or exertional cp,  orthopnea pnd or arm/hand swelling  or leg swelling, presyncope, palpitations, abdominal pain, anorexia, nausea, vomiting, diarrhea  or change in bowel habits or change in bladder habits, change in stools or change in urine, dysuria, hematuria,  rash, arthralgias, visual complaints, headache, numbness, weakness or ataxia or problems with walking or coordination,  change in mood or  memory.  Current Meds  Medication Sig   albuterol (VENTOLIN HFA) 108 (90 Base) MCG/ACT inhaler Inhale into the lungs.   buPROPion  (WELLBUTRIN  XL) 150 MG 24 hr tablet TAKE 3 TABLETS EVERY MORNING   cariprazine  (VRAYLAR ) 3 MG capsule Take 1 capsule (3 mg total) by mouth daily.   clonazePAM  (KLONOPIN ) 0.5 MG tablet Take 1 tablet (0.5 mg total) by mouth 2 (two) times daily.   dicyclomine  (BENTYL ) 10 MG capsule Take 1 capsule (10 mg total) by mouth 3 (three) times daily as needed for spasms.   docusate sodium  (COLACE) 100 MG capsule Take 100 mg by mouth 2  (two) times daily.   DULoxetine  (CYMBALTA ) 60 MG capsule Take 1 capsule (60 mg total) by mouth 2 (two) times daily.   Erenumab -aooe (AIMOVIG ) 70 MG/ML SOAJ INJECT 70 MG INTO THE SKIN EVERY 28 DAYS.   estradiol  (ESTRACE ) 0.5 MG tablet Take 0.5 mg by mouth daily.   Fe Fum-Fe Poly-Vit C-Lactobac (FUSION) 65-65-25-30 MG CAPS Take by mouth.   fluticasone (FLONASE SENSIMIST) 27.5 MCG/SPRAY nasal spray 2 sprays (1 spray in each nostril) Nasally Once a day during seasons of difficulty for 30 days   ibuprofen (ADVIL) 800 MG tablet Take 800 mg by mouth 3 (three) times daily.   ipratropium (ATROVENT) 0.06 % nasal spray    levothyroxine  (SYNTHROID , LEVOTHROID) 88 MCG tablet Take 88 mcg by mouth daily before breakfast.   liothyronine (CYTOMEL) 5 MCG tablet Take 5 mcg by mouth daily. Monday Wednesday Friday   lubiprostone  (AMITIZA ) 24 MCG capsule Take 1 capsule (24 mcg total) by mouth 2 (two) times daily with a meal.   MAGNESIUM CITRATE PO Magnesium Citrate   methylphenidate  (RITALIN ) 10 MG tablet Take 1 tablet (10 mg total) by mouth 2 (two) times daily.   Multiple Vitamins-Minerals (BARIATRIC FUSION PO) Bariatric Fusion   nystatin cream (MYCOSTATIN) Apply 1 application  topically daily as needed for dry skin.   Olopatadine HCl 0.2 % SOLN 1 drop into affected eye Ophthalmic Once a day or prn for 30 days   omeprazole  (PRILOSEC) 20 MG capsule    ondansetron  (ZOFRAN -ODT) 4 MG disintegrating tablet Take 1 tablet (4 mg total) by mouth every 8 (eight) hours as needed for nausea or vomiting.   Oxycodone  HCl 10 MG TABS Take 10 mg by mouth every 6 (six) hours as needed.   Polyethyl Glycol-Propyl Glycol 0.4-0.3 % SOLN Apply 1 drop to eye as needed.   polyethylene glycol (MIRALAX / GLYCOLAX) 17 g packet Take 17 g by mouth 2 (two) times daily.   potassium chloride  (K-DUR,KLOR-CON ) 10 MEQ tablet Take 1 tablet by mouth daily.   progesterone  (PROMETRIUM ) 100 MG capsule Take 100 mg by mouth daily.   promethazine   (PHENERGAN ) 12.5 MG tablet Take 1 tablet (12.5 mg total) by mouth every 8 (eight) hours as needed for nausea or vomiting.   Propylene Glycol (SYSTANE COMPLETE) 0.6 % SOLN Apply 2 drops to eye.   rOPINIRole  (REQUIP ) 2 MG tablet Take 2 mg by mouth 2 (two) times daily.   Tenapanor HCl (IBSRELA ) 50 MG TABS Take 50 mg by mouth 2 (two) times daily before a meal.   terconazole (TERAZOL 3) 0.8 % vaginal cream Place 1 applicator vaginally at bedtime.   tiZANidine (ZANAFLEX) 4 MG tablet Take 4 mg by mouth 2 (two) times daily.   torsemide (DEMADEX) 20 MG tablet Take 20 mg by mouth 2 (two) times daily.   traZODone  (DESYREL ) 100 MG tablet Take 2 tablets (200 mg total) by mouth  at bedtime.   Ubrogepant  (UBRELVY ) 100 MG TABS Take 1 tablet (100 mg total) by mouth daily as needed (at onset of headaches may repeat dose after 2 hours as needed max 200 mg in 24 hours).               Objective:   Physical Exam  wts  05/13/2024       265   05/23/16 230 lb (104.327 kg)  05/24/15 252 lb (114.306 kg)  05/09/15 252 lb 4 oz (114.42 kg)    Vital signs reviewed  05/13/2024  - Note at rest 02 sats  96% on RA   General appearance:    MO ( by bmi) amb wf nad    HEENT : Oropharynx  clear      Nasal turbinates nl    NECK :  without  apparent JVD/ palpable Nodes/TM    LUNGS: no acc muscle use,  Nl contour chest which is clear to A and P bilaterally without cough on insp or exp maneuvers   CV:  RRR  no s3 or murmur or increase in P2, and no edema   ABD:  soft and nontender   MS:  Gait nl   ext warm without deformities Or obvious joint restrictions  calf tenderness, cyanosis or clubbing    SKIN: warm and dry without lesions    NEURO:  alert, approp, nl sensorium with  no motor or cerebellar deficits apparent.        Dg Es  [id number] Normal swallowing mechanism. Poor esophageal motility with stasis of contrast in the esophagus. Tertiary contractions. No esophageal fold thickening, stricture or  obstruction. A 13 mm barium tablet passed into the stomach without difficulty.          Assessment:

## 2024-05-14 NOTE — Assessment & Plan Note (Signed)
 Body mass index is 41.6 kg/m.  -  trending up since last ove Lab Results  Component Value Date   TSH 1.72 05/26/2019      Contributing to doe and risk of GERD/dvt/PE  >>>   reviewed the need and the process to achieve and maintain neg calorie balance > defer f/u primary care including intermittently monitoring thyroid  status

## 2024-05-14 NOTE — Assessment & Plan Note (Signed)
 Onset in childhood -  W/u by Dr Jerelene Monday neg for allergies ? 2015> repeated by [facility] since then also neg  - Spirometry 05/23/2016  wnl including fef 25-75 - NO 05/23/2016  = 23  - 05/13/2024  After extensive coaching inhaler device,  effectiveness =    75% so try air supra up to 2 q 4 h prn   Response reported to pred and alb though only partial so reasonable to challenge with air supra as above   If not better happy to call in gabapentin  100 mg  at bedtime with maximum of 1200 mg per day  plus empirical PPI bid ac to prevent cyclical cough from LPR.  The component of cough with cold water that is reproducible strongly though suggest to me dx of Upper airway cough syndrome (previously labeled PNDS),  is so named because it's frequently impossible to sort out how much is  CR/sinusitis with freq throat clearing (which can be related to primary GERD)   vs  causing  secondary ( extra esophageal)  GERD from wide swings in gastric pressure that occur with throat clearing, often  promoting self use of mint and menthol lozenges that reduce the lower esophageal sphincter tone and exacerbate the problem further in a cyclical fashion.   These are the same pts (now being labeled as having irritable larynx syndrome by some cough centers) who not infrequently have a history of having failed to tolerate ace inhibitors,  dry powder inhalers or biphosphonates or report having atypical/extraesophageal reflux symptoms(LPR)  that don't respond to standard doses of PPI  and are easily confused as having aecopd or asthma flares by even experienced allergists/ pulmonologists (myself included).   If not improved rec gabapentin  titration to as high as 1200 mg per day or lowest effective dose/ advised  Discussed in detail all the  indications, usual  risks and alternatives  relative to the benefits with patient who agrees to proceed with Rx as outlined.             Each maintenance medication was reviewed in detail  including emphasizing most importantly the difference between maintenance and prns and under what circumstances the prns are to be triggered using an action plan format where appropriate.  Total time for H and P, chart review, counseling, reviewing hfa  device(s) and generating customized AVS unique to this office visit / same day charting = 46 min for pt not seen in > 3 y with  lifelong   refractory respiratory  symptoms of uncertain etiology

## 2024-05-17 ENCOUNTER — Telehealth: Payer: Self-pay

## 2024-05-17 DIAGNOSIS — E7800 Pure hypercholesterolemia, unspecified: Secondary | ICD-10-CM | POA: Diagnosis not present

## 2024-05-17 DIAGNOSIS — M545 Low back pain, unspecified: Secondary | ICD-10-CM | POA: Diagnosis not present

## 2024-05-17 DIAGNOSIS — J309 Allergic rhinitis, unspecified: Secondary | ICD-10-CM | POA: Diagnosis not present

## 2024-05-17 DIAGNOSIS — G4733 Obstructive sleep apnea (adult) (pediatric): Secondary | ICD-10-CM | POA: Diagnosis not present

## 2024-05-17 DIAGNOSIS — Z6841 Body Mass Index (BMI) 40.0 and over, adult: Secondary | ICD-10-CM | POA: Diagnosis not present

## 2024-05-17 NOTE — Telephone Encounter (Signed)
*  Pulm  Pharmacy Patient Advocate Encounter   Received notification from CoverMyMeds that prior authorization for Airsupra  90-80MCG/ACT aerosol  is required/requested.   Insurance verification completed.   The patient is insured through [facility] .   Per test claim: PA required; PA submitted to above mentioned insurance via CoverMyMeds Key/confirmation #/EOC [id number] Status is pending

## 2024-05-18 ENCOUNTER — Encounter: Payer: Self-pay | Admitting: Obstetrics and Gynecology

## 2024-05-18 ENCOUNTER — Ambulatory Visit: Payer: Medicare Other | Admitting: Obstetrics and Gynecology

## 2024-05-18 ENCOUNTER — Other Ambulatory Visit (HOSPITAL_COMMUNITY)
Admission: RE | Admit: 2024-05-18 | Discharge: 2024-05-18 | Disposition: A | Discharge status: Home / Self Care | Source: Other Acute Inpatient Hospital | Attending: Obstetrics and Gynecology | Admitting: Obstetrics and Gynecology

## 2024-05-18 VITALS — BP 100/67 | HR 72 | Ht 67.0 in | Wt 262.2 lb

## 2024-05-18 DIAGNOSIS — K5904 Chronic idiopathic constipation: Secondary | ICD-10-CM | POA: Insufficient documentation

## 2024-05-18 DIAGNOSIS — N393 Stress incontinence (female) (male): Secondary | ICD-10-CM | POA: Insufficient documentation

## 2024-05-18 DIAGNOSIS — R159 Full incontinence of feces: Secondary | ICD-10-CM | POA: Insufficient documentation

## 2024-05-18 DIAGNOSIS — R350 Frequency of micturition: Secondary | ICD-10-CM

## 2024-05-18 DIAGNOSIS — R053 Chronic cough: Secondary | ICD-10-CM | POA: Diagnosis not present

## 2024-05-18 DIAGNOSIS — R82998 Other abnormal findings in urine: Secondary | ICD-10-CM | POA: Diagnosis not present

## 2024-05-18 DIAGNOSIS — M6289 Other specified disorders of muscle: Secondary | ICD-10-CM | POA: Diagnosis not present

## 2024-05-18 DIAGNOSIS — N3281 Overactive bladder: Secondary | ICD-10-CM | POA: Insufficient documentation

## 2024-05-18 DIAGNOSIS — G4710 Hypersomnia, unspecified: Secondary | ICD-10-CM | POA: Diagnosis not present

## 2024-05-18 DIAGNOSIS — N952 Postmenopausal atrophic vaginitis: Secondary | ICD-10-CM | POA: Insufficient documentation

## 2024-05-18 DIAGNOSIS — G4733 Obstructive sleep apnea (adult) (pediatric): Secondary | ICD-10-CM | POA: Diagnosis not present

## 2024-05-18 LAB — POCT URINALYSIS DIP (CLINITEK)
Bilirubin, UA: NEGATIVE
Blood, UA: NEGATIVE
Glucose, UA: NEGATIVE mg/dL
Ketones, POC UA: NEGATIVE mg/dL
Nitrite, UA: NEGATIVE
POC PROTEIN,UA: NEGATIVE
Spec Grav, UA: 1.02 (ref 1.010–1.025)
Urobilinogen, UA: 0.2 U/dL
pH, UA: 6 (ref 5.0–8.0)

## 2024-05-18 MED ORDER — DIAZEPAM 5 MG PO TABS: ORAL_TABLET | ORAL | 0 refills | Status: DC

## 2024-05-18 MED ORDER — ESTRADIOL 0.1 MG/GM VA CREA: 0.5000 g | TOPICAL_CREAM | VAGINAL | 11 refills | Status: DC

## 2024-05-18 NOTE — Assessment & Plan Note (Signed)
-   Discussed use of vaginal estrogen cream for atrophy.  - Can also use vaginal moisturizer such as coconut oil, vitamin E or silicone based lubricant

## 2024-05-18 NOTE — Assessment & Plan Note (Signed)
-   Did not go into detail regarding SUI treatment today, although we did discuss that pelvic PT can help to improve symptoms. Will reassess at future visits.

## 2024-05-18 NOTE — Assessment & Plan Note (Signed)
-   Suspect this may be due to water intake and high tone pelvic floor. Recommended starting with pelvic PT. If this does not improve symptoms, then can consider other therapies.

## 2024-05-18 NOTE — Telephone Encounter (Signed)
 What is the basis of needing an appeal?

## 2024-05-18 NOTE — Assessment & Plan Note (Addendum)
-   We discussed that sensation of incomplete emptying as well as constipation and levator spasm on exam are consistent with high tone pelvic floor dysfunction.  - The origin of pelvic floor muscle spasm can be multifactorial, including primary, reactive to a different pain source, trauma, or even part of a centralized pain syndrome.Treatment options include pelvic floor physical therapy, local (vaginal) or oral  muscle relaxants, pelvic muscle trigger point injections or centrally acting pain medications.   - Recommended starting pelvic PT - Will also prescribed vaginal valium  to use at night- advised to use for two weeks nightly then as needed. Advised to use caution when taking with tizanidine and clonazepam  (she uses low doses for sleep).

## 2024-05-18 NOTE — Patient Instructions (Addendum)
 Start vaginal estrogen therapy nightly for two weeks then 2 times weekly at night for treatment of vaginal atrophy (dryness of the vaginal tissues).  This was sent to [facility]- you need to make an account online for this prescription.    Vulvovaginal moisturizer Options: Vitamin E oil (pump or capsule) or cream (Gene's Vit E Cream) Coconut oil V-magic balm (can get on amazon) Silicone-based lubricant for use during sex(wet platinum is a brand available at most drugstores) Crisco Consider the ingredients of the product - the fewer the ingredients the better!  Directions for Use: Clean and dry your hands Gently dab the vulvar/vaginal area dry as needed Apply a "pea-sized" amount of the moisturizer onto your fingertip Using you other hand, open the labia  Apply the moisturizer to the vulvar/vaginal tissues Wear loose fitting underwear/clothing if possible following application Use moisturize up to 3 times daily as desired.   I will prescribe valium  5 mg pills to place vaginally up to 2 times a day for vaginal muscle spasms. Start at night and take one every night for the next several weeks to see if it improves your symptoms. Once you are improving you can taper off the medication and just use as needed. If the medication makes you drowsy then only use at bedtime and/or we can reduce the dose. Do not use gel to place it, as that will prevent the tablet from dissolving.  Instead place 1-2 drops of water on the table before inserting it in the vagina. If the pills do not dissolve well we can switch to a special compounded suppository. Let me know how you are doing on the medication and if you have any questions.

## 2024-05-18 NOTE — Assessment & Plan Note (Signed)
-   Treated by GI for IBS-C
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
## 2024-05-18 NOTE — Assessment & Plan Note (Signed)
-   Likely due to chronic constipation and overflow. Can improve with pelvic PT

## 2024-05-18 NOTE — Telephone Encounter (Unsigned)
 Copied from CRM [id number]. Topic: Clinical - Medication Prior Auth >> May 18, 2024  1:44 PM Hilton Lucky wrote: Reason for CRM: Sharee Dates with [employer] is calling to state that Airsupra  has been denied through [facility] coverage and we will need to file an appeal.

## 2024-05-19 ENCOUNTER — Other Ambulatory Visit (HOSPITAL_BASED_OUTPATIENT_CLINIC_OR_DEPARTMENT_OTHER): Payer: Self-pay

## 2024-05-19 ENCOUNTER — Encounter (INDEPENDENT_AMBULATORY_CARE_PROVIDER_SITE_OTHER): Payer: Self-pay

## 2024-05-19 LAB — URINE CULTURE: Culture: <10000 — AB

## 2024-05-19 NOTE — Telephone Encounter (Signed)
 What is the clinical basis that the patient needs an appeal? I have not received a fax yet. The denial letter will give the reason that the plan denied the request for the medication, in order to appeal we need the providers reasoning in why the patient DOES need the medication.

## 2024-05-21 DIAGNOSIS — G4733 Obstructive sleep apnea (adult) (pediatric): Secondary | ICD-10-CM | POA: Diagnosis not present

## 2024-05-25 ENCOUNTER — Ambulatory Visit (INDEPENDENT_AMBULATORY_CARE_PROVIDER_SITE_OTHER): Payer: Self-pay | Admitting: Family Medicine

## 2024-05-25 ENCOUNTER — Encounter (INDEPENDENT_AMBULATORY_CARE_PROVIDER_SITE_OTHER): Payer: Self-pay | Admitting: Family Medicine

## 2024-05-25 VITALS — BP 111/77 | HR 72 | Temp 97.6°F | Ht 67.0 in | Wt 259.0 lb

## 2024-05-25 DIAGNOSIS — R0602 Shortness of breath: Secondary | ICD-10-CM

## 2024-05-25 DIAGNOSIS — Z1331 Encounter for screening for depression: Secondary | ICD-10-CM

## 2024-05-25 DIAGNOSIS — Z6841 Body Mass Index (BMI) 40.0 and over, adult: Secondary | ICD-10-CM

## 2024-05-25 DIAGNOSIS — Z9884 Bariatric surgery status: Secondary | ICD-10-CM

## 2024-05-25 DIAGNOSIS — E639 Nutritional deficiency, unspecified: Secondary | ICD-10-CM

## 2024-05-25 DIAGNOSIS — E7849 Other hyperlipidemia: Secondary | ICD-10-CM | POA: Diagnosis not present

## 2024-05-25 DIAGNOSIS — G4733 Obstructive sleep apnea (adult) (pediatric): Secondary | ICD-10-CM

## 2024-05-25 DIAGNOSIS — F39 Unspecified mood [affective] disorder: Secondary | ICD-10-CM

## 2024-05-25 DIAGNOSIS — R5383 Other fatigue: Secondary | ICD-10-CM

## 2024-05-25 DIAGNOSIS — F5089 Other specified eating disorder: Secondary | ICD-10-CM | POA: Diagnosis not present
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
## 2024-05-26 ENCOUNTER — Ambulatory Visit: Admitting: Psychology

## 2024-05-26 DIAGNOSIS — F4310 Post-traumatic stress disorder, unspecified: Secondary | ICD-10-CM | POA: Diagnosis not present

## 2024-05-26 DIAGNOSIS — F902 Attention-deficit hyperactivity disorder, combined type: Secondary | ICD-10-CM | POA: Diagnosis not present

## 2024-05-26 DIAGNOSIS — F3341 Major depressive disorder, recurrent, in partial remission: Secondary | ICD-10-CM | POA: Diagnosis not present

## 2024-05-26 LAB — VITAMIN B1

## 2024-05-26 NOTE — Progress Notes (Addendum)
 [facility] Counselor/Therapist Progress Note  Patient ID: ZURIYAH SHATZ, MRN: [id number],    Date: 05/26/2024  Time Spent: 56 minutes  Time in:2:04   Time out:3:00  Treatment Type: Individual Therapy  Reported Symptoms: depression and worry  Mental Status Exam: Appearance:  Casual     Behavior: Appropriate  Motor: Normal  Speech/Language:  Normal Rate  Affect: blunted  Mood: pleasant  Thought process: normal  Thought content:   WNL  Sensory/Perceptual disturbances:   WNL  Orientation: oriented to person, place, time/date, and situation  Attention: Good  Concentration: Good  Memory: WNL  Fund of knowledge:  Good  Insight:   Good  Judgment:  Fair  Impulse Control: Fair   Risk Assessment: Danger to Self:  No Self-injurious Behavior: No Danger to Others: No Duty to Warn:no Physical Aggression / Violence:No  Access to Firearms a concern: No  Gang Involvement:No   Subjective: The patient attended a face-to-face individual therapy session via video visit.  The patient gave verbal consent for this session to be on video on caregility and patient is aware of the limitations of telehealth..  The patient was in her home alone and therapist was in the office alone.  We ended up having to have the session at the end on the telephone because we kept losing the connection.  The patient [facility] and she is going to start her program tomorrow.  We talked about what she talked about with her physician there and her physician would like to us  to work on why she emotionally eats.  I explained to her that really the key to understanding that is that she needs to pay attention that when she is eating prior to eating why she eating the food that she is going to eat.  We talked about it not being mystical and that having an understanding of what it is that you are doing when you are doing it is probably the best way to manage it.  I  think that the patient often eats for social reasons with her partner.  And in some ways she probably historically has emotionally eaten when she was in dissociative states.  I encouraged her to just be more mindful about what she was doing and we can continue to talk about ways for her to manage her eating behavior.  Interventions: Cognitive Behavioral Therapy  Diagnosis:Attention deficit hyperactivity disorder (ADHD), combined type  PTSD (post-traumatic stress disorder)  Recurrent major depressive disorder, in partial remission (HCC)  Plan: Treatment Plan  Strengths/Abilities:  Intelligent, kind, motivated  Treatment Preferences:  Outpatient Individual therapy  Statement of Needs:  I need some help with getting back out into the world and learn how to interact and be social   Symptoms:  Describes a reliving of the event, particularly through dissociative flashbacks.:  (Status: improved). Displays a significant decline in interest and engagement in activities.: (Status: improved). Displays significant psychological and/or physiological  distress resulting from internal and external clues that are reminiscent of the traumatic event.:  (Status: improved). Experiences disturbances in sleep.:   (Status: improved). Experiences disturbing and persistent thoughts, images, and/or perceptions of the  traumatic event.: (Status: improved). Experiences frequent nightmares.:  (Status: improved). Has been exposed to a traumatic event involving actual or  perceived threat of death or serious injury.: (Status: maintained).  Hypervigilance (e.g., feeling constantly on edge, experiencing concentration difficulties, having trouble falling or staying asleep, exhibiting a general state of irritability).: (  Status:  improved). Impairment in social, occupational, or other areas of functioning.:  (Status: improved). Intentionally avoids activities, places, people, or objects (e.g., up-armored vehicles) that  evoke memories of the event.:  (Status: improved). Intentionally avoids  thoughts, feelings, or discussions related to the traumatic event.: (Status:  improved). Reports difficulty concentrating as well as feelings of guilt.:   (Status: improved). Reports response of intense fear, helplessness, or horror to the traumatic event.: (Status: improved). Symptoms present more than one month.:  (Status: maintained).    Problems Addressed:  Anxiety, Posttraumatic Stress Disorder (PTSD),  Goals:  LTG:  1. Enhance ability to effectively cope with the full variety of life's worries  and anxieties.  90% 2. No longer avoids persons, places, activities, and objects that are  reminiscent of the traumatic event.  90% 3. No longer experiences intrusive event recollections, avoidance of event  reminders, intense arousal, or disinterest in activities or  relationships.  100% 4. Stabilize anxiety level while increasing ability to function on a daily  basis.  90% 5. Thinks about or openly discusses the traumatic event with others  without experiencing psychological or physiological distress.  80%  STG: 1.Identify and engage in pleasant activities on a daily basis..  90% 2.Identify, challenge, and replace biased, fearful self-talk with positive, realistic, and empowering self talk 3.  Participate in Cognitive Therapy to help identify, challenge, and replace biased, negative, and selfdefeating thoughts resulting from the trauma.  80 % 4.  Participate in Eye Movement Desensitization and Reprocessing (EMDR) to reduce emotional distress  related to traumatic thoughts, feelings, and images.  90%  Target Date:  02/16/2025 Frequency: Every three weeks Modality:Individual Therapy Interventions by Therapist:  CBT, EMDR, insight oriented therapy, problem solving  Patient approved treatment plan and is progressing nicely.  Deriana Vanderhoef G Bluma Buresh,  LCSW

## 2024-05-27 NOTE — Telephone Encounter (Signed)
 Closing request due to no response.

## 2024-05-31 LAB — CBC WITH DIFFERENTIAL/PLATELET
Basophils Absolute: 0.1 10*3/uL (ref 0.0–0.2)
Basos: 1 %
EOS (ABSOLUTE): 0.1 10*3/uL (ref 0.0–0.4)
Eos: 2 %
Hematocrit: 42.3 % (ref 34.0–46.6)
Hemoglobin: 13.4 g/dL (ref 11.1–15.9)
Immature Grans (Abs): 0 10*3/uL (ref 0.0–0.1)
Immature Granulocytes: 0 %
Lymphocytes Absolute: 2.2 10*3/uL (ref 0.7–3.1)
Lymphs: 31 %
MCH: 30.5 pg (ref 26.6–33.0)
MCHC: 31.7 g/dL (ref 31.5–35.7)
MCV: 96 fL (ref 79–97)
Monocytes Absolute: 0.4 10*3/uL (ref 0.1–0.9)
Monocytes: 5 %
Neutrophils Absolute: 4.2 10*3/uL (ref 1.4–7.0)
Neutrophils: 61 %
Platelets: 213 10*3/uL (ref 150–450)
RBC: 4.39 x10E6/uL (ref 3.77–5.28)
RDW: 12.9 % (ref 11.7–15.4)
WBC: 7 10*3/uL (ref 3.4–10.8)

## 2024-05-31 LAB — COMPREHENSIVE METABOLIC PANEL WITH GFR
ALT: 38 IU/L — ABNORMAL HIGH (ref 0–32)
AST: 32 IU/L (ref 0–40)
Albumin: 4.2 g/dL (ref 3.8–4.9)
Alkaline Phosphatase: 103 IU/L (ref 44–121)
BUN/Creatinine Ratio: 19 (ref 9–23)
BUN: 16 mg/dL (ref 6–24)
Bilirubin Total: 0.3 mg/dL (ref 0.0–1.2)
CO2: 27 mmol/L (ref 20–29)
Calcium: 9.2 mg/dL (ref 8.7–10.2)
Chloride: 99 mmol/L (ref 96–106)
Creatinine, Ser: 0.86 mg/dL (ref 0.57–1.00)
Globulin, Total: 2.1 g/dL (ref 1.5–4.5)
Glucose: 95 mg/dL (ref 70–99)
Potassium: 4.2 mmol/L (ref 3.5–5.2)
Sodium: 141 mmol/L (ref 134–144)
Total Protein: 6.3 g/dL (ref 6.0–8.5)
eGFR: 78 mL/min/{1.73_m2} (ref >59)

## 2024-05-31 LAB — INSULIN, RANDOM: INSULIN: 5.4 u[IU]/mL (ref 2.6–24.9)

## 2024-05-31 LAB — LIPID PANEL
Chol/HDL Ratio: 2.7 ratio (ref 0.0–4.4)
Cholesterol, Total: 170 mg/dL (ref 100–199)
HDL: 64 mg/dL (ref >39)
LDL Chol Calc (NIH): 94 mg/dL (ref 0–99)
Triglycerides: 61 mg/dL (ref 0–149)
VLDL Cholesterol Cal: 12 mg/dL (ref 5–40)

## 2024-05-31 LAB — IRON AND TIBC
Iron Saturation: 28 % (ref 15–55)
Iron: 94 ug/dL (ref 27–159)
Total Iron Binding Capacity: 334 ug/dL (ref 250–450)
UIBC: 240 ug/dL (ref 131–425)

## 2024-05-31 LAB — TSH: TSH: 1.43 u[IU]/mL (ref 0.450–4.500)

## 2024-05-31 LAB — FOLATE: Folate: 15.2 ng/mL (ref >3.0)

## 2024-05-31 LAB — VITAMIN B12: Vitamin B-12: 727 pg/mL (ref 232–1245)

## 2024-05-31 LAB — T4, FREE: Free T4: 1.35 ng/dL (ref 0.82–1.77)

## 2024-05-31 LAB — VITAMIN D 25 HYDROXY (VIT D DEFICIENCY, FRACTURES): Vit D, 25-Hydroxy: 81.5 ng/mL (ref 30.0–100.0)

## 2024-05-31 LAB — HEMOGLOBIN A1C
Est. average glucose Bld gHb Est-mCnc: 111 mg/dL
Hgb A1c MFr Bld: 5.5 % (ref 4.8–5.6)

## 2024-05-31 LAB — FERRITIN: Ferritin: 49 ng/mL (ref 15–150)

## 2024-05-31 LAB — TRANSFERRIN: Transferrin: 277 mg/dL (ref 192–364)

## 2024-06-01 ENCOUNTER — Telehealth: Payer: Self-pay | Admitting: Pharmacist

## 2024-06-01 NOTE — Progress Notes (Signed)
 Contacted patient regarding referral for medication management re: polypharmacy from Jefferey Fitch, MD .   Appointment scheduled  for 06/02/2024 @ 9 AM   Annabella Galla, PharmD Clinical Pharmacist [place] Direct Dial: [phone]

## 2024-06-02 ENCOUNTER — Other Ambulatory Visit (HOSPITAL_COMMUNITY): Payer: Self-pay

## 2024-06-02 ENCOUNTER — Telehealth: Payer: Self-pay | Admitting: Pharmacist

## 2024-06-02 ENCOUNTER — Telehealth: Payer: Self-pay

## 2024-06-02 ENCOUNTER — Other Ambulatory Visit: Payer: Self-pay | Admitting: Pharmacist

## 2024-06-02 NOTE — Telephone Encounter (Signed)
 Telephone call to patient and reviewed information from Dr. Marilynne. At this patient would like to try sending the prescription to [facility] pharmacy. Test claim ran through insurance with an estimated cost to patient of $6-12. Advised patient that if medication is cost prohibitive we can revisit the [facility] and account set up. Patient is agreeable with this plan.   Paige [name], PharmD Clinical Pharmacist [place] Direct Dial: [phone]

## 2024-06-02 NOTE — Telephone Encounter (Signed)
-----   Message from Rosaline LOISE Caper sent at 06/02/2024  1:08 PM EDT ----- Hello,   [facility] because the cost would be less. I discussed this with the patient at her visit. She needs to make an account online to have it delivered. I provided her with the information to make an account. Happy to send to [facility] if she would like instead though.   Thanks,   Rosaline Caper ----- Message ----- From: Romona Annabella Garre, [facility] Sent: 06/02/2024  12:48 PM EDT To: Rosaline Caper Rx Refill  Hi Dr. Caper,   I spoke with patient telephonically today. She has not received the estradiol  vaginal cream yet. Would you please send the prescription to her local pharmacy [facility] per patient request?   Thank you,   Annabella Romona, PharmD Clinical Pharmacist [place] Direct Dial: [phone]

## 2024-06-02 NOTE — Progress Notes (Deleted)
   06/02/2024  Patient ID: Paige [name], female   DOB: [birth year], 58 y.o.   MRN: [id number]

## 2024-06-02 NOTE — Progress Notes (Signed)
 06/02/2024 Name: Paige [name] MRN: [id number] DOB: [birth year]  Chief Complaint  Patient presents with   Medication Management    Paige [name] is a 58 y.o. year old female who presented for a telephone visit.   They were referred to the pharmacist by their PCP for assistance in  medication review.    Subjective:  Care Team: Primary Care Provider: Jefferey Fitch, MD ; Next Scheduled Visit: 08/18/2024  Medication Access/Adherence  Current Pharmacy:  [facility] - [place], [place] - [address] [place] [address] Phone: [phone] Fax: [phone]  [facility] [employer] [facility] - [place], [place] - [address] [place] [address] Phone: [phone] Fax: [phone]  [facility] [phone] - [place], [place] - [address] AT [facility] [address] [facility] [address] [place] [address] Phone: [phone] Fax: [phone]  [facility] - [place], [place] - [address] [place] [address] Phone: [phone] Fax: [phone]  [facility] - [place], [place] - [address] [name] [address] Phone: [phone] Fax: [phone]   Patient reports affordability concerns with their medications: Yes  Patient reports access/transportation concerns to their pharmacy: No  Patient reports adherence concerns with their medications:  No    Objective:  Lab Results  Component Value Date   HGBA1C 5.5 05/25/2024    Lab Results  Component Value Date   CREATININE 0.86 05/25/2024   BUN 16 05/25/2024   NA 141 05/25/2024   K 4.2 05/25/2024   CL 99 05/25/2024   CO2 27 05/25/2024    Lab Results  Component Value Date   CHOL 170 05/25/2024   HDL 64 05/25/2024   LDLCALC 94 05/25/2024   TRIG 61 05/25/2024   CHOLHDL 2.7 05/25/2024     Medications Reviewed Today     Reviewed by Romona Annabella Garre, RPH (Pharmacist) on 06/02/24 at 1112  Med List Status: <None>   Medication Order Taking? Sig Documenting Provider Last Dose Status Informant  Albuterol-Budesonide (AIRSUPRA ) 90-80 MCG/ACT AERO [id number] Yes Inhale 2 puffs into the lungs every 4 (four) hours as needed. Wert, Michael B, MD  Active   buPROPion  (WELLBUTRIN  XL) 150 MG 24 hr tablet [id number] Yes TAKE 3 TABLETS EVERY MORNING Mozingo, Regina Nattalie, NP  Active   cariprazine  (VRAYLAR ) 3 MG capsule [id number] Yes Take 1 capsule (3 mg total) by mouth daily. Mozingo, Regina Nattalie, NP  Active   clonazePAM  (KLONOPIN ) 0.5 MG tablet [id number] Yes Take 1 tablet (0.5 mg total) by mouth 2 (two) times daily. Mozingo, Regina Nattalie, NP  Active   diazepam  (VALIUM ) 5 MG tablet [id number] Yes Place 1 tablet vaginally nightly as needed for muscle spasm/ pelvic pain. Marilynne Rosaline SAILOR, MD  Active    Patient not taking:   Discontinued 06/02/24 1052 (Completed Course)   docusate sodium  (COLACE) 100 MG capsule [id number] Yes Take 100 mg by mouth 2 (two) times daily. [provider]  Active   DULoxetine  (CYMBALTA ) 60 MG capsule [id number] Yes Take 1 capsule (60 mg total) by mouth 2 (two) times daily. Mozingo, Regina Nattalie, NP  Active   Erenumab -aooe (AIMOVIG ) 70 MG/ML SOAJ [id number] Yes INJECT 70 MG INTO THE SKIN EVERY 28 DAYS. Skeet Juliene SAUNDERS, DO  Active   estradiol  (ESTRACE ) 0.1 MG/GM vaginal cream [id number]  Place 0.5 g  vaginally 2 (two) times a week. Place 0.5g nightly for two weeks then twice a week after  Patient not taking: Reported on 06/02/2024   Marilynne Rosaline SAILOR, MD  Active   estradiol  (ESTRACE ) 0.5 MG tablet [id number] Yes Take 0.5 mg by mouth daily. [provider]  Active   Fe Fum-Fe Poly-Vit C-Lactobac (FUSION) 65-65-25-30 MG CAPS [id number] Yes Take by mouth. [provider]  Active   fluticasone (FLONASE SENSIMIST) 27.5 MCG/SPRAY nasal  spray [id number] Yes 2 sprays (1 spray in each nostril) Nasally Once a day during seasons of difficulty for 30 days [provider]  Active   ibuprofen (ADVIL) 800 MG tablet [id number] Yes Take 800 mg by mouth 3 (three) times daily. [provider]  Active   ipratropium (ATROVENT) 0.06 % nasal spray [id number] Yes Place 1 spray into both nostrils daily. [provider]  Active   levothyroxine  (SYNTHROID , LEVOTHROID) 88 MCG tablet [id number] Yes Take 88 mcg by mouth daily before breakfast. [provider]  Active   liothyronine (CYTOMEL) 5 MCG tablet [id number] Yes Take 5 mcg by mouth daily. [provider]  Active   Magnesium Citrate (MAGNESIUM GUMMIES PO) [id number] Yes Take 125 mg by mouth 2 (two) times daily. [provider]  Active     Discontinued 06/02/24 1057 (Duplicate)            Med Note>> Romona Annabella Garre, [facility]   06/02/2024 10:57 AM Changed to correct formualtion and dose    methylphenidate  (RITALIN ) 10 MG tablet [id number] Yes Take 1 tablet (10 mg total) by mouth 2 (two) times daily. Mozingo, Regina Nattalie, NP  Active     Discontinued 06/02/24 1107 (Completed Course)            Med Note>> Romona Annabella Garre, [facility]   06/02/2024 11:07 AM Taking OTC MVI gummie    Multiple Vitamins-Minerals (MULTIVITAMIN GUMMIES ADULT PO) [id number] Yes Take 1 each by mouth daily. [provider]  Active   nystatin cream (MYCOSTATIN) [id number] Yes Apply 1 application  topically daily as needed for dry skin. [provider]  Active   Olopatadine HCl 0.2 % SOLN [id number]  1 drop into affected eye Ophthalmic Once a day or prn for 30 days  Patient not taking: Reported on 06/02/2024   [provider]  Active   omeprazole  (PRILOSEC) 20 MG capsule [id number] Yes Take 20 mg by mouth 2 (two) times daily before a meal. [provider]  Active   ondansetron  (ZOFRAN -ODT) 4 MG disintegrating tablet [id number] Yes Take 1 tablet (4  mg total) by mouth every 8 (eight) hours as needed for nausea or vomiting. Skeet Juliene SAUNDERS, DO  Active   Oxycodone  HCl 10 MG TABS [id number] Yes Take 10 mg by mouth every 6 (six) hours as needed. [provider]  Active    Discontinued 06/02/24 [phone] (Completed Course)            Med Note>> Romona Annabella Garre, [facility]   06/02/2024  9:37 AM Duplication in therapy     Patient not taking:   Discontinued 06/02/24 0935 (Change in therapy)            Med Note>> Michael, Sheena J, CMA   09/10/2022  9:47 AM Now taking Ibsrela     potassium chloride  (K-DUR,KLOR-CON ) 10 MEQ tablet [id number] Yes Take 1 tablet by mouth daily. [provider]  Active   progesterone  (PROMETRIUM ) 100 MG capsule [id number] Yes Take 100 mg by mouth daily. [provider]  Active   promethazine  (PHENERGAN ) 12.5 MG tablet [id number] Yes Take 1 tablet (12.5 mg total) by mouth every 8 (eight) hours as needed for nausea or vomiting. Aneita Gwendlyn DASEN, MD  Active   Propylene Glycol [facility] COMPLETE) 0.6 % SOLN [id number] Yes Apply 2 drops to eye. [provider]  Active   rOPINIRole  (REQUIP ) 2 MG tablet [id number] Yes Take 2 mg by mouth 2 (two) times daily. [provider]  Active   Tenapanor HCl (IBSRELA ) 50 MG TABS [id number] Yes Take 50 mg by mouth 2 (two) times daily before a meal. Craig Alan SAUNDERS, [place]  Active     Discontinued 06/02/24 0940 (Completed Course)            Med Note>> Romona Annabella Garre, [facility]   06/02/2024  9:40 AM completed course    tiZANidine (ZANAFLEX) 4 MG tablet [id number] Yes Take 4 mg by mouth 2 (two) times daily. [provider]  Active   torsemide (DEMADEX) 20 MG tablet [id number] Yes Take 20 mg by mouth 2 (two) times daily. [provider]  Active   traZODone  (DESYREL ) 100 MG tablet [id number] Yes Take 2 tablets (200 mg total) by mouth at bedtime. Mozingo, Regina Nattalie, NP  Active   Ubrogepant  (UBRELVY ) 100 MG TABS [id number]  Take 1 tablet (100  mg total) by mouth daily as needed (at onset of headaches may repeat dose after 2 hours as needed max 200 mg in 24 hours).  Patient not taking: Reported on 06/02/2024   Skeet Juliene SAUNDERS, DO  Active   Vitamin D -Vitamin K (VITAMIN K2-VITAMIN D3 PO) [id number] Yes Take 1 each by mouth daily. Vitamin K2 100 mcg/Vitamin D  50 mcg per gummie [provider]  Active               Assessment/Plan:   Medication Management: - Currently strategy sufficient to maintain appropriate adherence to prescribed medication regimen -Reviewed medication lists from [employer] and [facility] with patient today.   -Patient reports not taking:  Dicyclomine  10 mg  2. Miralax 17 gm - replaced with Ibsrela  50 mg  3. Famotidine 40 mg  4. Hydroxyzine 5. Promethazine  DM syrup  -Medications Not Taking but Needed:  Ubrelvy  100 mg 1 tablet PO daily as needed at onset of headaches, may repeat after 2 hours as needed, max 200 mg in 24 hours).  Patient historically received through patient assistance program. Lost access to assistance. Will work with patient and PCP to reinstate patient assistance.    2. Estradiol  0.1mg /gm vaginal cream Place 0.5 g nightly for two weeks then twice a week after  Medication was sent to [facility] and patient has not received at this time. Patient would like to get started with the medication, a message will be sent to prescriber to request that medication be sent [facility]. Test claim was processed and cost is estimated at $6-12.   Drug Drug Interactions: dicyclomine  + potassium chloride  solid forms Risk Rating X: Avoid combination Patient is NOT taking dicyclomine  and has not been taking this medication for some time.  Reviewed with patient in detail that this combination is not recommended due to solid oral dosage forms of potassium chloride  are contraindicated in patients with impaired gastric emptying (eg, due to the effects of drugs such as many  anticholinergics) due to the risks of gastric and intestinal irritation and ulceration associated with prolonged contact between these solid dosage forms and the gastrointestinal mucosa.   Medications that can  potentially be stopped after PCP review:  Vitamin E OTC supplementation Reviewed with patient that vitamin E is a fat soluble vitamin and can accumulate. No medical indication for Vitamin E supplementation noted. Likely also receiving RDA in multivitamin.   2. Vitamin D3 with K2 Patient is taking a multivitamin and recent labs show Vitamin D  level is 81.5. Patient may receive adequate RDA through multivitamin.      Follow Up Plan:  Will coordinate with [facility] Patient Advocate for patient assistance applications for Ubrelvy , Amiovig, and Airsupra . Will request applications to be signed by PCP.  Will request estradiol  cream to be sent to [facility] pharmacy by prescribing provider.  Will relay to PCP information shared by patient that torsemide dose is not as effective lately.  Patient is waiting for modafinil 200 mg start due to need for PA through [employer], will coordinate with office regarding status.   Annabella Galla, PharmD Clinical Pharmacist [place] Direct Dial: [phone]

## 2024-06-02 NOTE — Telephone Encounter (Signed)
 Pharmacy Patient Advocate Encounter  Insurance verification completed.   The patient is insured through [facility]   Ran test claim for Estradiol  Cream. Currently a quantity of 42.5 g is a 30 day supply and the co-pay is $6 .  This test claim was processed through [facility]- copay amounts may vary at other pharmacies due to pharmacy/plan contracts, or as the patient moves through the different stages of their insurance plan.

## 2024-06-03 ENCOUNTER — Telehealth: Admitting: Adult Health

## 2024-06-03 ENCOUNTER — Other Ambulatory Visit (HOSPITAL_COMMUNITY): Payer: Self-pay

## 2024-06-03 ENCOUNTER — Other Ambulatory Visit: Payer: Self-pay | Admitting: Obstetrics and Gynecology

## 2024-06-03 ENCOUNTER — Telehealth: Payer: Self-pay

## 2024-06-03 DIAGNOSIS — N952 Postmenopausal atrophic vaginitis: Secondary | ICD-10-CM

## 2024-06-03 MED ORDER — ESTRADIOL 0.1 MG/GM VA CREA: 0.5000 g | TOPICAL_CREAM | VAGINAL | 11 refills | Status: AC

## 2024-06-03 NOTE — Telephone Encounter (Signed)
 PAP: Patient assistance application for Airsupra  through [employer] ([employer]) has been mailed to pt's home address on file. Provider portion of application will be faxed to provider's office.  PAP: Patient assistance application for Aimovig  through [employer] has been mailed to pt's home address on file. Provider portion of application will be faxed to provider's office.  PAP: Patient assistance application for Ubrelvy  through [employer]) has been mailed to pt's home address on file. Provider portion of application will be faxed to provider's office.     Provider portion of patient assistance application has been faxed to Dr. Aleck Milch at [facility]

## 2024-06-08 ENCOUNTER — Encounter (INDEPENDENT_AMBULATORY_CARE_PROVIDER_SITE_OTHER): Payer: Self-pay | Admitting: Family Medicine

## 2024-06-08 ENCOUNTER — Ambulatory Visit (INDEPENDENT_AMBULATORY_CARE_PROVIDER_SITE_OTHER): Payer: Self-pay | Admitting: Family Medicine

## 2024-06-08 VITALS — BP 112/73 | HR 69 | Temp 97.6°F | Ht 67.0 in | Wt 247.0 lb

## 2024-06-08 DIAGNOSIS — E88819 Insulin resistance, unspecified: Secondary | ICD-10-CM

## 2024-06-08 DIAGNOSIS — Z6838 Body mass index (BMI) 38.0-38.9, adult: Secondary | ICD-10-CM

## 2024-06-08 DIAGNOSIS — K760 Fatty (change of) liver, not elsewhere classified: Secondary | ICD-10-CM | POA: Diagnosis not present

## 2024-06-08 DIAGNOSIS — E559 Vitamin D deficiency, unspecified: Secondary | ICD-10-CM | POA: Diagnosis not present

## 2024-06-08 DIAGNOSIS — Z9884 Bariatric surgery status: Secondary | ICD-10-CM

## 2024-06-08 DIAGNOSIS — E7849 Other hyperlipidemia: Secondary | ICD-10-CM

## 2024-06-08 DIAGNOSIS — E079 Disorder of thyroid, unspecified: Secondary | ICD-10-CM

## 2024-06-08 DIAGNOSIS — E639 Nutritional deficiency, unspecified: Secondary | ICD-10-CM

## 2024-06-08 NOTE — Progress Notes (Signed)
 Paige [name], D.O.  ABFM, ABOM Clinical Bariatric Medicine Physician  Office located at: [address] [place], [place]  [address]   Assessment and Plan:   FOR THE DISEASE OF OBESITY:  BMI 38.0-38.9,adult current 38.69 Morbid obesity (HCC) Starting BMI 40.57 Assessment & Plan: Since last office visit on 05/25/2024 patient's  Muscle mass has decreased by 0.2 lb. Fat mass has decreased by 12.2 lb. Total body water has decreased by 8.6 lb.  Counseling done on how various foods will affect these numbers and how to maximize success  Total lbs lost to date: - 12 lbs  Total weight loss percentage to date: -4.63%    Recommended Dietary Goals Conor is currently in the action stage of change. As such, her goal is to continue weight management plan.  She has agreed to the CAT 2 MP with B & L choices. Pt also provided the option to journal 1200-1300 calories and 85++ grams protein daily.    Behavioral Intervention We discussed the following today: high protein breakfast options, increasing water intake  and using GPT or another AI platform for recipe ideas- searching low calorie, low carb, high protein chicken recipes etc  Additional resources provided today: Handout on CAT 1-2 breakfast options, Handout on slow cooker recipes, and Handout on insulin  resistance education, Handout on CHAT-GPT inspired recipes.   Evidence-based interventions for health behavior change were utilized today including the discussion of self monitoring techniques, problem-solving barriers and SMART goal setting techniques.  Regarding patient's less desirable eating habits and patterns, we employed the technique of small changes.   Pt will specifically work on: n/a   Recommended Physical Activity Goals Aiya has been advised to work up to 150 minutes of moderate intensity aerobic activity a week and strengthening exercises 2-3 times per week for cardiovascular health, weight loss maintenance and  preservation of muscle mass.   She has agreed to : continue to gradually increase the amount and intensity of exercise routine   Pharmacotherapy Continue Wellbutrin  therapy.    ASSOCIATED CONDITIONS ADDRESSED TODAY:   Labs dated 05/25/2024 were extensively reviewed with pt today and education provided on them and how the foods patient eats may influence these findings. All questions were answered about them.     Metabolic dysfunction-associated steatotic liver disease (MASLD) - new onset Assessment & Plan:    Component Value Date/Time   PROT 6.3 05/25/2024 1214   ALBUMIN 4.2 05/25/2024 1214   AST 32 05/25/2024 1214   ALT 38 (H) 05/25/2024 1214   ALKPHOS 103 05/25/2024 1214   BILITOT 0.3 05/25/2024 1214   BILIDIR 0.1 05/12/2017 1222    She has a history of elevated ALT; current ALT: 38. Her visceral fat rating is 15. Should be < 12 in a female.  I feel this is likely MASLD although I cannot see any imaging of her abdomen to truly confirm Dx. Of note, she is on opiods which can contribute to elevated ALT.  Continue with reducing saturated fats, simple and added sugars. Losing 10-15% of adipose tissue may also improve condition.     Insulin  resistance - new onset Assessment & Plan: Lab Results  Component Value Date   HGBA1C 5.5 05/25/2024   INSULIN  5.4 05/25/2024   Lab Results  Component Value Date   CREATININE 0.86 05/25/2024   BUN 16 05/25/2024   NA 141 05/25/2024   K 4.2 05/25/2024   CL 99 05/25/2024   CO2 27 05/25/2024   Lab Results  Component  Value Date   WBC 7.0 05/25/2024   HGB 13.4 05/25/2024   HCT 42.3 05/25/2024   MCV 96 05/25/2024   PLT 213 05/25/2024   Lab Results  Component Value Date   TSH 1.430 05/25/2024   FREET4 1.35 05/25/2024   Fasting insulin  is very slightly above goal of <5. States her blood sugars have been well-controlled since starting her meal plan. Good control of hunger and cravings. Hemoglobin A1c, kidney function, blood counts,  and thyroid  levels are within acceptable ranges.   Patient aware of disease state and risk of progression. Explained role of simple carbs and insulin  levels on hunger and cravings. Educated patient that having adequate amounts of protein with each meal is important for increasing muscle mass, stabilizing sugars, controlling hunger and cravings, and improving thermogenesis.   Continue to decrease simple carbs/ sugars; increase fiber and proteins -> follow her meal plan. Pt provided the option to journal intake. Encouraged adequate hydration.     History of Roux-en-Y gastric bypass/Nutritional deficiency Assessment & Plan: Lab Results  Component Value Date   IRON  94 05/25/2024   TIBC 334 05/25/2024   FERRITIN 49 05/25/2024    Lab Results  Component Value Date   FOLATE 15.2 05/25/2024    Lab Results  Component Value Date   VITAMINB12 727 05/25/2024   Per history. Takes bariatric fusion supplement with iron . Reviewed recent labs above which are within acceptable limits. Continue implementation of medically supervised weight management plan     Thyroid  disease Assessment & Plan: Lab Results  Component Value Date   TSH 1.430 05/25/2024   FREET4 1.35 05/25/2024    Reports good compliance and tolerance of Levothyroxine  88 mcg daily and Liothyronine 5 mcg daily. No concerns with TSH and FREET4. Continue regimen per specialist/PCP.     Vitamin D  deficiency Assessment & Plan: Lab Results  Component Value Date   VD25OH 81.5 05/25/2024   She reports having a h/o VD deficieny. Currently on daily Vitamin K2 100 mcg/Vitamin D  50 mcg per gummie. Her Vitamin D  levels are above goal of 50 to 70. Currently no signs or symptoms of hypervitaminosis D. Shared decision making: Take Vitamin K2 100 mcg/Vitamin D  50 mcg per gummie every other day. Recheck: 3 mos.     Other hyperlipidemia Assessment & Plan: Lab Results  Component Value Date   CHOL 170 05/25/2024   HDL 64 05/25/2024    LDLCALC 94 05/25/2024   TRIG 61 05/25/2024   CHOLHDL 2.7 05/25/2024   The 10-year ASCVD risk score (Arnett DK, et al., 2019) is: 1.8%   Values used to calculate the score:     Age: 58 years     Clincally relevant sex: Female     Is Non-Hispanic African American: No     Diabetic: No     Tobacco smoker: No     Systolic Blood Pressure: 112 mmHg     Is BP treated: No     HDL Cholesterol: 64 mg/dL     Total Cholesterol: 170 mg/dL  She endorses having a strong fhx of hyperlipidemia. Condition managed with  diet and life style interventions. Lipid panel WNL.   Continue to work on nutrition plan -decreasing simple carbohydrates, increasing lean proteins, decreasing saturated fats and cholesterol , avoiding trans fats and exercise as able to promote weight loss and decrease cardiovascular risks.     FOLLOW UP:   Return 07/01/2024 at 9:40 AM. She was informed of the importance of frequent follow up visits to  maximize her success with intensive lifestyle modifications for her multiple health conditions.   Subjective:   Chief complaint: Obesity Andrya is here to discuss her progress with her obesity treatment plan. She is on  the Category 2 Plan with lunch options and states she is following her eating plan approximately 100 % of the time. She states she is walking 120 minutes 7 days per week.  Interval History:  RAVENNE WAYMENT is here today for her first follow-up office visit since starting the program with us . She is accompanied by her boyfriend [place]. Patient is off to a good start and has lost 12 lbs. States she followed her meal plan 100% of the time. States it was hard to eat everything especially the lean proteins but she was able to by the end of the day. Reports being bored with the [place] sandwiches at lunch. Denies issues with hunger and cravings.    Pharmacotherapy that aid with weight loss:  On Wellbutrin  XL 150 mg 3 tablets daily.   Review of Systems:  Pertinent  positives were addressed with patient today.   Reviewed by clinician on day of visit: allergies, medications, problem list, medical history, surgical history, family history, social history, and previous encounter notes.  Weight Summary and Biometrics   Weight Lost Since Last Visit: 12lb  Weight Gained Since Last Visit: 0lb   Vitals Temp: 97.6 F (36.4 C) BP: 112/73 Pulse Rate: 69 SpO2: 95 %   Anthropometric Measurements Height: 5' 7 (1.702 m) Weight: 247 lb (112 kg) BMI (Calculated): 38.68 Weight at Last Visit: 259lb Weight Lost Since Last Visit: 12lb Weight Gained Since Last Visit: 0lb Starting Weight: 259lb Total Weight Loss (lbs): 12 lb (5.443 kg) Peak Weight: 316lb Waist Measurement : 51 inches   Body Composition  Body Fat %: 49.4 % Fat Mass (lbs): 122 lbs Muscle Mass (lbs): 118.6 lbs Total Body Water (lbs): 89.4 lbs Visceral Fat Rating : 15   Other Clinical Data RMR: [id number] Fasting: Yes Labs: Yes Today's Visit #: 2 Starting Date: 05/25/24     Objective:   PHYSICAL EXAM:  Blood pressure 112/73, pulse 69, temperature 97.6 F (36.4 C), height 5' 7 (1.702 m), weight 247 lb (112 kg), SpO2 95%. Body mass index is 38.69 kg/m.  General: she is overweight, cooperative and in no acute distress.   HEENT: EOMI, sclerae are anicteric. Lungs: Normal breathing effort, no conversational dyspnea. M-Sk:  Normal gross ROM * 4 extremities  PSYCH: Has normal mood, affect and thought process. Neurologic: No gross sensory or motor deficits. Well developed, A and O * 3  DIAGNOSTIC DATA REVIEWED:  BMET    Component Value Date/Time   NA 141 05/25/2024 1214   K 4.2 05/25/2024 1214   CL 99 05/25/2024 1214   CO2 27 05/25/2024 1214   GLUCOSE 95 05/25/2024 1214   GLUCOSE 97 05/26/2019 1046   BUN 16 05/25/2024 1214   CREATININE 0.86 05/25/2024 1214   CALCIUM 9.2 05/25/2024 1214   Lab Results  Component Value Date   HGBA1C 5.5 05/25/2024   Lab Results   Component Value Date   INSULIN  5.4 05/25/2024   Lab Results  Component Value Date   TSH 1.430 05/25/2024   CBC    Component Value Date/Time   WBC 7.0 05/25/2024 1214   WBC 6.9 05/26/2019 1046   RBC 4.39 05/25/2024 1214   RBC 4.42 05/26/2019 1046   HGB 13.4 05/25/2024 1214   HCT 42.3 05/25/2024 1214   PLT 213  05/25/2024 1214   MCV 96 05/25/2024 1214   MCH 30.5 05/25/2024 1214   MCHC 31.7 05/25/2024 1214   MCHC 32.5 05/26/2019 1046   RDW 12.9 05/25/2024 1214   Iron  Studies    Component Value Date/Time   IRON  94 05/25/2024 1214   TIBC 334 05/25/2024 1214   FERRITIN 49 05/25/2024 1214   IRONPCTSAT 28 05/25/2024 1214   Lipid Panel     Component Value Date/Time   CHOL 170 05/25/2024 1214   TRIG 61 05/25/2024 1214   HDL 64 05/25/2024 1214   CHOLHDL 2.7 05/25/2024 1214   LDLCALC 94 05/25/2024 1214   Hepatic Function Panel     Component Value Date/Time   PROT 6.3 05/25/2024 1214   ALBUMIN 4.2 05/25/2024 1214   AST 32 05/25/2024 1214   ALT 38 (H) 05/25/2024 1214   ALKPHOS 103 05/25/2024 1214   BILITOT 0.3 05/25/2024 1214   BILIDIR 0.1 05/12/2017 1222      Component Value Date/Time   TSH 1.430 05/25/2024 1214   Nutritional Lab Results  Component Value Date   VD25OH 81.5 05/25/2024    Attestations:   I, Special Puri, acting as a Stage manager for [employer], DO., have compiled all relevant documentation for today's office visit on behalf of Paige Jenkins, DO, while in the presence of [employer], DO.  I have spent 54 minutes in the care of the patient today including 47 minutes of face-to-face counseling and reviewing listed medical problems above as outlined in office visit note, providing nutritional and behavioral counseling as outlined in obesity care plan, independently interpreting results and goals of care, see listed medical problems, and discussing biometric information and progress. We reviewed her meal plan and discussed how the foods  she's eating is affecting each one of her labs. Pt educated on why we want her to eat various foods in various amounts and has a better understanding of the nutritional plan because of this. All her questions were answered today.   I have reviewed the above documentation for accuracy and completeness, and I agree with the above. Paige JINNY [name], D.O.  The 21st Century Cures Act was signed into law in 2016 which includes the topic of electronic health records.  This provides immediate access to information in MyChart.  This includes consultation notes, operative notes, office notes, lab results and pathology reports.  If you have any questions about what you read please let us  know at your next visit so we can discuss your concerns and take corrective action if need be.  We are right here with you.

## 2024-06-09 DIAGNOSIS — M545 Low back pain, unspecified: Secondary | ICD-10-CM | POA: Diagnosis not present

## 2024-06-09 DIAGNOSIS — M48062 Spinal stenosis, lumbar region with neurogenic claudication: Secondary | ICD-10-CM | POA: Diagnosis not present

## 2024-06-09 DIAGNOSIS — G894 Chronic pain syndrome: Secondary | ICD-10-CM | POA: Diagnosis not present

## 2024-06-09 DIAGNOSIS — Z1330 Encounter for screening examination for mental health and behavioral disorders, unspecified: Secondary | ICD-10-CM | POA: Diagnosis not present

## 2024-06-09 DIAGNOSIS — M546 Pain in thoracic spine: Secondary | ICD-10-CM | POA: Diagnosis not present

## 2024-06-10 ENCOUNTER — Telehealth: Admitting: Adult Health

## 2024-06-10 ENCOUNTER — Encounter: Payer: Self-pay | Admitting: Adult Health

## 2024-06-10 DIAGNOSIS — F329 Major depressive disorder, single episode, unspecified: Secondary | ICD-10-CM

## 2024-06-10 DIAGNOSIS — F411 Generalized anxiety disorder: Secondary | ICD-10-CM

## 2024-06-10 DIAGNOSIS — F902 Attention-deficit hyperactivity disorder, combined type: Secondary | ICD-10-CM

## 2024-06-10 DIAGNOSIS — G4700 Insomnia, unspecified: Secondary | ICD-10-CM | POA: Diagnosis not present

## 2024-06-10 DIAGNOSIS — F4310 Post-traumatic stress disorder, unspecified: Secondary | ICD-10-CM | POA: Diagnosis not present

## 2024-06-10 DIAGNOSIS — F3341 Major depressive disorder, recurrent, in partial remission: Secondary | ICD-10-CM

## 2024-06-10 MED ORDER — CLONAZEPAM 0.5 MG PO TABS: 0.5000 mg | ORAL_TABLET | Freq: Two times a day (BID) | ORAL | 2 refills | Status: DC

## 2024-06-10 NOTE — Progress Notes (Signed)
 Paige [name] [id number] Jan 17, 1966 58 y.o.  Virtual Visit via Video Note  I connected with pt @ on 06/10/24 at  2:00 PM EDT by a video enabled telemedicine application and verified that I am speaking with the correct person using two identifiers.   I discussed the limitations of evaluation and management by telemedicine and the availability of in person appointments. The patient expressed understanding and agreed to proceed.  I discussed the assessment and treatment plan with the patient. The patient was provided an opportunity to ask questions and all were answered. The patient agreed with the plan and demonstrated an understanding of the instructions.   The patient was advised to call back or seek an in-person evaluation if the symptoms worsen or if the condition fails to improve as anticipated.  I provided 25 minutes of non-face-to-face time during this encounter.  The patient was located at home.  The provider was located at [facility].   Angeline Paige Sayers, NP   Subjective:   Patient ID:  Paige [name] is a 58 y.o. (DOB 03/09/1966) female.  Chief Complaint: No chief complaint on file.   HPI Paige [name] presents for follow-up of GAD, MDD, PTSD and insomnia.  Describes mood today as ok. Pleasant. Reports tearfulness - once in a while. Mood symptoms - reports some depression, irritability and anxiety. Reports varying interest and motivation. Reports chronic pain issues. Denies panic attacks. Reports some worry, rumination and over thinking. Reports mood as variable. Stating I feel like this is about as good as it gets. Reports taking medications as prescribed. Continues to see therapist every 4 weeks - Paige [name].  Energy levels lower. Active, does not have a regular exercise routine - working with P/T.  Enjoys some usual interests and activities. Lives with partner and cats. Mother local and supportive. Attends church. Appetite adequate. Weight  loss 12 pounds - 247 pounds- 68.5. Working with [employer] [facility]. Sleeps better some nights than others. Reports an average of 4 to 6 hours - broken sleep. Diagnosed with sleep apnea - using BiPAP machine. Denies daytime napping. Reports improved focus and concentration. Completing tasks around the house. 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 Disabled since 2009. Denies SI or HI.  Denies AH or VH. Denies self harm. Denies substance use.  Previous medication trials: Lexapro, Paxil, Trazadone, Wellbutrin  SR, Geodon, Seroquel, Risperdal, Depakote, Topamax , Lithium, Provigil, Ambien, Cytomel, Clonazepam  and other benzodiazapines, Elavil, Effexor, Remeron, Prozac, Anafranil, Tofranil, Zoloft, Geodon, Buspar, Adderall, Ritalin , Abilify, Rexulti, Lamictal , Latuda    Review of Systems:  Review of Systems  Musculoskeletal:  Negative for gait problem.  Neurological:  Negative for tremors.  Psychiatric/Behavioral:         Please refer to HPI    Medications: I have reviewed the patient's current medications.  Current Outpatient Medications  Medication Sig Dispense Refill   Albuterol-Budesonide (AIRSUPRA ) 90-80 MCG/ACT AERO Inhale 2 puffs into the lungs every 4 (four) hours as needed. 10.7 g 11   buPROPion  (WELLBUTRIN  XL) 150 MG 24 hr tablet TAKE 3 TABLETS EVERY MORNING 270 tablet 3   cariprazine  (VRAYLAR ) 3 MG capsule Take 1 capsule (3 mg total) by mouth daily. 90 capsule 0   clonazePAM  (KLONOPIN ) 0.5 MG tablet Take 1 tablet (0.5 mg total) by mouth 2 (two) times daily. 60 tablet 2   diazepam  (VALIUM ) 5 MG tablet Place 1 tablet vaginally nightly as needed for muscle spasm/ pelvic pain. 30 tablet 0  docusate sodium  (COLACE) 100 MG capsule Take 100 mg by mouth 2 (two) times daily.     DULoxetine  (CYMBALTA ) 60 MG capsule Take 1 capsule (60 mg total) by mouth 2 (two) times daily. 180 capsule 3   Erenumab -aooe (AIMOVIG ) 70 MG/ML SOAJ INJECT 70 MG INTO THE SKIN  EVERY 28 DAYS. 1 mL 11   estradiol  (ESTRACE ) 0.1 MG/GM vaginal cream Place 0.5 g vaginally 2 (two) times a week. Place 0.5g nightly for two weeks then twice a week after 42.5 g 11   estradiol  (ESTRACE ) 0.5 MG tablet Take 0.5 mg by mouth daily.     Fe Fum-Fe Poly-Vit C-Lactobac (FUSION) 65-65-25-30 MG CAPS Take by mouth.     fluticasone (FLONASE SENSIMIST) 27.5 MCG/SPRAY nasal spray 2 sprays (1 spray in each nostril) Nasally Once a day during seasons of difficulty for 30 days     ibuprofen (ADVIL) 800 MG tablet Take 800 mg by mouth 3 (three) times daily.     ipratropium (ATROVENT) 0.06 % nasal spray Place 1 spray into both nostrils daily.     levothyroxine  (SYNTHROID , LEVOTHROID) 88 MCG tablet Take 88 mcg by mouth daily before breakfast.     liothyronine (CYTOMEL) 5 MCG tablet Take 5 mcg by mouth daily.     Magnesium Citrate (MAGNESIUM GUMMIES PO) Take 125 mg by mouth 2 (two) times daily.     methylphenidate  (RITALIN ) 10 MG tablet Take 1 tablet (10 mg total) by mouth 2 (two) times daily. 60 tablet 0   Multiple Vitamins-Minerals (MULTIVITAMIN GUMMIES ADULT PO) Take 1 each by mouth daily.     nystatin cream (MYCOSTATIN) Apply 1 application  topically daily as needed for dry skin.     Olopatadine HCl 0.2 % SOLN 1 drop into affected eye Ophthalmic Once a day or prn for 30 days (Patient not taking: Reported on 06/02/2024)     omeprazole  (PRILOSEC) 20 MG capsule Take 20 mg by mouth 2 (two) times daily before a meal.     ondansetron  (ZOFRAN -ODT) 4 MG disintegrating tablet Take 1 tablet (4 mg total) by mouth every 8 (eight) hours as needed for nausea or vomiting. 20 tablet 11   Oxycodone  HCl 10 MG TABS Take 10 mg by mouth every 6 (six) hours as needed.     potassium chloride  (K-DUR,KLOR-CON ) 10 MEQ tablet Take 1 tablet by mouth daily.     progesterone  (PROMETRIUM ) 100 MG capsule Take 100 mg by mouth daily.     promethazine  (PHENERGAN ) 12.5 MG tablet Take 1 tablet (12.5 mg total) by mouth every 8 (eight)  hours as needed for nausea or vomiting. 30 tablet 0   Propylene Glycol (SYSTANE COMPLETE) 0.6 % SOLN Apply 2 drops to eye.     rOPINIRole  (REQUIP ) 2 MG tablet Take 2 mg by mouth 2 (two) times daily.     Tenapanor HCl (IBSRELA ) 50 MG TABS Take 50 mg by mouth 2 (two) times daily before a meal. 60 tablet 3   tiZANidine (ZANAFLEX) 4 MG tablet Take 4 mg by mouth 2 (two) times daily.     torsemide (DEMADEX) 20 MG tablet Take 20 mg by mouth 2 (two) times daily.     traZODone  (DESYREL ) 100 MG tablet Take 2 tablets (200 mg total) by mouth at bedtime. 180 tablet 3   Ubrogepant  (UBRELVY ) 100 MG TABS Take 1 tablet (100 mg total) by mouth daily as needed (at onset of headaches may repeat dose after 2 hours as needed max 200 mg in 24 hours). (Patient  not taking: Reported on 06/02/2024) 16 tablet 11   Vitamin D -Vitamin K (VITAMIN K2-VITAMIN D3 PO) Take 1 each by mouth daily. Vitamin K2 100 mcg/Vitamin D  50 mcg per gummie     No current facility-administered medications for this visit.    Medication Side Effects: None  Allergies:  Allergies  Allergen Reactions   Tape Dermatitis and Itching    BURNING AND BLISTERING - USUALLY DOES OK WITH TEGADERM  Paper tape or Coban OK   Topiramate  Other (See Comments) and Shortness Of Breath    Really wierd feeling  Off balance; cloudy thinking  Really wierd feeling Off balance; cloudy thinking    Off balance; cloudy thinking   Cyclobenzaprine Other (See Comments)    Other reaction(s): Unknown Hallucinations Unknown    Flexeril [Cyclobenzaprine Hcl] Other (See Comments)    hallucinations   Meloxicam Other (See Comments)   Naltrexone Other (See Comments)    SEVERE PAIN ALL OVER   Pork (Porcine) Protein Other (See Comments)    Other reaction(s): Other (See Comments) Migraines Other reaction(s): Other (See Comments) Migraines   Sumatriptan Other (See Comments)    Difficulty breathing Other reaction(s): Other (see comments) Off balance    Wound Dressing  Adhesive Other (See Comments)    Tape - Burns and blisters; caused skin infection   Brexpiprazole Other (See Comments)    Rexulti     Restless arms   Glycopyrrolate  Other (See Comments)    Urinary retention   Imitrex [Sumatriptan Base] Other (See Comments)    Pt unsure of allergic reaction/intolerance   Lamotrigine  Other (See Comments)    Pt unsure of allergy/intolerance   Sulfa Antibiotics Other (See Comments)    Pt states she does not think she is allergic to Sulfa     Past Medical History:  Diagnosis Date   Allergic rhinitis    Allergy    Anemia    Anxiety disorder    Back pain    Bilateral swelling of feet    Bronchitis    Cancer (HCC)    skin   Chronic fatigue    Chronic fatigue    Chronic headaches    Chronic pain    Constipation    Depression    Endometriosis    Family history of colon cancer    mother,uncles,aunts   Fibromyalgia    Fibromyalgia    Gallbladder problem    GERD (gastroesophageal reflux disease)    Hyperlipidemia    Hypothyroidism    IBS (irritable bowel syndrome)    Insomnia    Iron  deficiency    Joint pain    Laryngopharyngeal reflux (LPR)    Obesity    Osteoarthritis    Personal history of colonic polyps 04/2006   polypoid   PONV (postoperative nausea and vomiting)    RLS (restless legs syndrome)    Sleep apnea    no cpap   SOB (shortness of breath)    Swelling    Vitamin B12 deficiency    Vitamin D  deficiency     Family History  Problem Relation Age of Onset   Obesity Mother    Depression Mother    Cancer Mother    Heart disease Mother    Hyperlipidemia Mother    Hypertension Mother    Colon cancer Mother        uncles,aunts   Sleep apnea Father    Cancer Father    Diabetes Father        paternal grandmother,sister   Leukemia  Father    Lymphoma Father    Skin cancer Father    Heart disease Father    Colon polyps Sister    Diabetes Sister    Colon cancer Maternal Aunt    Colon cancer Maternal Uncle    Colon  cancer Cousin    Colon polyps Cousin    Stomach cancer Neg Hx    Esophageal cancer Neg Hx    Rectal cancer Neg Hx     Social History   Socioeconomic History   Marital status: Significant Other    Spouse name: Leonce Ee   Number of children: 0   Years of education: Not on file   Highest education level: Not on file  Occupational History   Occupation: RN/disabled  Tobacco Use   Smoking status: Never   Smokeless tobacco: Never  Vaping Use   Vaping status: Never Used  Substance and Sexual Activity   Alcohol  use: Not Currently    Alcohol /week: 0.0 standard drinks of alcohol     Comment: rarely   Drug use: No   Sexual activity: Yes    Birth control/protection: Other-see comments  Other Topics Concern   Not on file  Social History Narrative   Patient is left-handed. She lives alone in a one level home. She has 3 cats. She is unable to exercise. Caffeine one soda / day. On disability now. 2 bachelor degrees   Social Drivers of Health   Financial Resource Strain: Low Risk  (04/06/2024)   Received from [facility]   Overall Financial Resource Strain (CARDIA)    Difficulty of Paying Living Expenses: Not hard at all  Food Insecurity: No Food Insecurity (04/06/2024)   Received from [facility]   Hunger Vital Sign    Within the past 12 months, you worried that your food would run out before you got the money to buy more.: Never true    Within the past 12 months, the food you bought just didn't last and you didn't have money to get more.: Never true  Transportation Needs: No Transportation Needs (04/06/2024)   Received from [facility] - Transportation    Lack of Transportation (Medical): No    Lack of Transportation (Non-Medical): No  Physical Activity: Not on file  Stress: Not on file  Social Connections: Not on file  Intimate Partner Violence: Not on file    Past Medical History, Surgical history, Social history, and Family history were reviewed and updated as  appropriate.   Please see review of systems for further details on the patient's review from today.   Objective:   Physical Exam:  There were no vitals taken for this visit.  Physical Exam Constitutional:      General: She is not in acute distress. Musculoskeletal:        General: No deformity.  Neurological:     Mental Status: She is alert and oriented to person, place, and time.     Coordination: Coordination normal.  Psychiatric:        Attention and Perception: Attention and perception normal. She does not perceive auditory or visual hallucinations.        Mood and Affect: Mood normal. Mood is not anxious or depressed. Affect is not labile, blunt, angry or inappropriate.        Speech: Speech normal.        Behavior: Behavior normal.        Thought Content: Thought content normal. Thought content is not paranoid or delusional. Thought  content does not include homicidal or suicidal ideation. Thought content does not include homicidal or suicidal plan.        Cognition and Memory: Cognition and memory normal.        Judgment: Judgment normal.     Comments: Insight intact     Lab Review:     Component Value Date/Time   NA 141 05/25/2024 1214   K 4.2 05/25/2024 1214   CL 99 05/25/2024 1214   CO2 27 05/25/2024 1214   GLUCOSE 95 05/25/2024 1214   GLUCOSE 97 05/26/2019 1046   BUN 16 05/25/2024 1214   CREATININE 0.86 05/25/2024 1214   CALCIUM 9.2 05/25/2024 1214   PROT 6.3 05/25/2024 1214   ALBUMIN 4.2 05/25/2024 1214   AST 32 05/25/2024 1214   ALT 38 (H) 05/25/2024 1214   ALKPHOS 103 05/25/2024 1214   BILITOT 0.3 05/25/2024 1214       Component Value Date/Time   WBC 7.0 05/25/2024 1214   WBC 6.9 05/26/2019 1046   RBC 4.39 05/25/2024 1214   RBC 4.42 05/26/2019 1046   HGB 13.4 05/25/2024 1214   HCT 42.3 05/25/2024 1214   PLT 213 05/25/2024 1214   MCV 96 05/25/2024 1214   MCH 30.5 05/25/2024 1214   MCHC 31.7 05/25/2024 1214   MCHC 32.5 05/26/2019 1046   RDW  12.9 05/25/2024 1214   LYMPHSABS 2.2 05/25/2024 1214   MONOABS 0.6 05/26/2019 1046   EOSABS 0.1 05/25/2024 1214   BASOSABS 0.1 05/25/2024 1214    No results found for: POCLITH, LITHIUM   No results found for: PHENYTOIN, PHENOBARB, VALPROATE, CBMZ   .res Assessment: Plan:    Plan:  1. Wellbutrin  XL 450mg  daily - in the am - may decrease to 300mg  daily 2. Cymbalta  60mg  BID 3. Trazadone 100mg  - 2 at hs  4. Vraylar  3mg  daily though patient assistance.  5. Clonazepam  0.5mg  BID - taking at bedtime 6. Ritalin  10mg  BID   Continue therapy with Paige [name].   RTC 6 weeks  25 minutes spent dedicated to the care of this patient on the date of this encounter to include pre-visit review of records, ordering of medication, post visit documentation, and face-to-face time with the patient discussing GAD, MDD, PTSD and insomnia. Discussed continuing current medication regimen.  Patient advised to contact office with any questions, adverse effects, or acute worsening in signs and symptoms.  Discussed potential metabolic side effects associated with atypical antipsychotics, as well as potential risk for movement side effects. Advised pt to contact office if movement side effects occur.   Discussed potential benefits, risk, and side effects of benzodiazepines to include potential risk of tolerance and dependence, as well as possible drowsiness. Advised patient not to drive if experiencing drowsiness and to take lowest possible effective dose to minimize risk of dependence and tolerance.     There are no diagnoses linked to this encounter.   Please see After Visit Summary for patient specific instructions.  Future Appointments  Date Time Provider Department Center  06/10/2024  2:00 PM Tari Lecount, Angeline Mattocks, NP [facility] None  06/23/2024  2:00 PM [name], Peggye MATSU, LCSW [facility] None  07/01/2024  9:40 AM Midge Sober, DO [facility] None  07/15/2024  2:40 PM Opalski, Sober, DO [facility]  None  07/20/2024 10:00 AM Marilynne Rosaline SAILOR, MD [facility]  07/21/2024  2:00 PM [name], Peggye MATSU, LCSW [facility] None  08/18/2024  2:00 PM [name], Paige G, LCSW [facility] None  08/26/2024  2:00 PM Elnor Channing FALCON,  PT [facility]  09/02/2024  2:00 PM Elnor Channing FALCON, PT [facility]  09/09/2024  2:00 PM Elnor Channing FALCON, PT [facility]  09/13/2024  1:50 PM Skeet Juliene SAUNDERS, DO [facility] None  09/16/2024  2:00 PM Elnor Channing FALCON, PT [facility] [facility]    No orders of the defined types were placed in this encounter.     -------------------------------

## 2024-06-11 NOTE — Telephone Encounter (Signed)
 Received provider portion of patient assistance application

## 2024-06-16 ENCOUNTER — Telehealth: Payer: Self-pay | Admitting: Pharmacist

## 2024-06-16 DIAGNOSIS — G4700 Insomnia, unspecified: Secondary | ICD-10-CM | POA: Diagnosis not present

## 2024-06-16 NOTE — Progress Notes (Signed)
 Attempted to contact patient for medication management follow up. Left HIPAA compliant message for patient to return my call at their convenience.   Annabella Galla, PharmD Clinical Pharmacist [place] Direct Dial: [phone]

## 2024-06-16 NOTE — Progress Notes (Signed)
   06/16/2024  Patient ID: Paige [name] Endo, female   DOB: [birth year], 58 y.o.   MRN: [id number]  Telephonic engagement with Mrs. Endo regarding follow up for the following:   Patient assistance Ubrelvy , Amiovig, and Airsupra  applications have been received signed from Dr. Jefferey. Advised that she will be receiving her portions in the mail enclosed in a [facility] envelope. Advised that patient may drop them off to me Wednesday, 06/23/2024 or mail them back if more convenient. Patient appreciative and verbalized understanding.  Patient confirms that prescription for Estradiol  cream was picked up from [facility] pharmacy and patient has not been having pleasant effects with use. Advised patient to contact Dr. Arnie via MyChart or at office visit 07/20/2024.  Advised patient of Dr. Ardis recommendations for no dose increase with torsemide at this time. Counseled patient on leg elevation and compression socks. Patient verbalized understanding.  Patient verbalizes that she has not received update regarding PA for modafinil 200 mg, advised that I will reach out to [place] for update.   Patient has no questions at this time. Advised patient she may call me with any questions or needs that may arise and patient has my direct contact information.   Annabella Galla, PharmD Clinical Pharmacist [place] Direct Dial: [phone]

## 2024-06-18 ENCOUNTER — Telehealth: Payer: Self-pay | Admitting: Pharmacist

## 2024-06-18 ENCOUNTER — Telehealth: Payer: Self-pay

## 2024-06-18 ENCOUNTER — Other Ambulatory Visit (HOSPITAL_COMMUNITY): Payer: Self-pay

## 2024-06-18 NOTE — Progress Notes (Signed)
   06/18/2024  Patient ID: Paige [name], female   DOB: 12/15/1965, 58 y.o.   MRN: [id number]  Telephonic engagement with Mrs. [name] today. Relayed information to patient regarding modafinil prescription and costs. Patient would like prescription sent to [facility]. A message has been sent to Dr. Jefferey for this to be done.  Patient confirms mailing patient assistance forms back. Will call with any future questions or concerns.   Annabella Galla, PharmD Clinical Pharmacist [place] Direct Dial: [phone]

## 2024-06-18 NOTE — Telephone Encounter (Signed)
 Pharmacy Patient Advocate Encounter  Insurance verification completed.   The patient is insured through [employer]   Ran test claim for modafinil. Currently a quantity of 30 is a 30 day supply and the co-pay is $16.21 .   This test claim was processed through [facility]- copay amounts may vary at other pharmacies due to pharmacy/plan contracts, or as the patient moves through the different stages of their insurance plan.

## 2024-06-18 NOTE — Progress Notes (Signed)
 Attempted to contact patient for medication management follow up re: modafinil. Left HIPAA compliant message for patient to return my call at their convenience.   Annabella Galla, PharmD Clinical Pharmacist [place] Direct Dial: [phone]

## 2024-06-20 DIAGNOSIS — G4733 Obstructive sleep apnea (adult) (pediatric): Secondary | ICD-10-CM | POA: Diagnosis not present

## 2024-06-21 DIAGNOSIS — M48062 Spinal stenosis, lumbar region with neurogenic claudication: Secondary | ICD-10-CM | POA: Diagnosis not present

## 2024-06-21 NOTE — Telephone Encounter (Signed)
 PAP: Patient assistance application for Airsupra  has been approved by PAP Companies: [place] from 06/21/2024 to 12/01/2024. Medication should be delivered to PAP Delivery: Home. For further shipping updates, please contact [employer] ([employer]) at [phone]. Patient ID is: [id number]

## 2024-06-21 NOTE — Telephone Encounter (Signed)
 PAP: Application for Air Spura has been submitted to [employer] ([employer]), via fax

## 2024-06-21 NOTE — Telephone Encounter (Signed)
 PAP: Application for Ubrelvy  has been submitted to [employer] [facility]), via fax PAP: Application for Aimovig  has been submitted to [employer], via fax

## 2024-06-23 ENCOUNTER — Ambulatory Visit: Admitting: Psychology

## 2024-06-23 ENCOUNTER — Telehealth: Payer: Self-pay | Admitting: Adult Health

## 2024-06-23 DIAGNOSIS — F411 Generalized anxiety disorder: Secondary | ICD-10-CM

## 2024-06-23 DIAGNOSIS — F902 Attention-deficit hyperactivity disorder, combined type: Secondary | ICD-10-CM | POA: Diagnosis not present

## 2024-06-23 DIAGNOSIS — F4310 Post-traumatic stress disorder, unspecified: Secondary | ICD-10-CM | POA: Diagnosis not present

## 2024-06-23 DIAGNOSIS — F3341 Major depressive disorder, recurrent, in partial remission: Secondary | ICD-10-CM | POA: Diagnosis not present

## 2024-06-23 NOTE — Telephone Encounter (Signed)
 Pt Lvm @ 3:05p stating she spoke to her therapist today.   She said she is wanting to change from Ritalin  to Provigil.  Next appt 8/25

## 2024-06-23 NOTE — Progress Notes (Unsigned)
 [facility] Counselor/Therapist Progress Note  Patient ID: POOJA CAMUSO, MRN: [id number],    Date: 06/23/2024  Time Spent: 55 minutes  Time in:2:06  Time out:3:01  Treatment Type: Individual Therapy  Reported Symptoms: depression and worry  Mental Status Exam: Appearance:  Casual     Behavior: Appropriate  Motor: Normal  Speech/Language:  Normal Rate  Affect: blunted  Mood: Tired   Thought process: normal  Thought content:   WNL  Sensory/Perceptual disturbances:   WNL  Orientation: oriented to person, place, time/date, and situation  Attention: Good  Concentration: Good  Memory: WNL  Fund of knowledge:  Good  Insight:   Good  Judgment:  Fair  Impulse Control: Fair   Risk Assessment: Danger to Self:  No Self-injurious Behavior: No Danger to Others: No Duty to Warn:no Physical Aggression / Violence:No  Access to Firearms a concern: No  Gang Involvement:No   Subjective: The patient attended a face-to-face individual therapy session via video visit.  The patient gave verbal consent for this session to be on video on caregility and patient is aware of the limitations of telehealth..  The patient was in her home alone and therapist was in the office alone.  The patient presents as very tired today.  We talked about what she has going on and apparently she is volunteering again for the behavior and is going from 5:30 in the morning until late in the evening.  She also has recently decreased her Wellbutrin  and she has been dieting since I saw her last.  She reports that she has lost 13 pounds in a month.  We talked about her making sure that she is getting all of her snacks in and that she take a break after the show ends.  Otherwise she is doing okay emotionally and she reports that she and Leonce are still doing well.   Interventions: Cognitive Behavioral Therapy  Diagnosis:Recurrent major depressive disorder, in partial remission (HCC)  Generalized  anxiety disorder  Attention deficit hyperactivity disorder (ADHD), combined type  PTSD (post-traumatic stress disorder)  Plan: Treatment Plan  Strengths/Abilities:  Intelligent, kind, motivated  Treatment Preferences:  Outpatient Individual therapy  Statement of Needs:  I need some help with getting back out into the world and learn how to interact and be social   Symptoms:  Describes a reliving of the event, particularly through dissociative flashbacks.:  (Status: improved). Displays a significant decline in interest and engagement in activities.: (Status: improved). Displays significant psychological and/or physiological  distress resulting from internal and external clues that are reminiscent of the traumatic event.:  (Status: improved). Experiences disturbances in sleep.:   (Status: improved). Experiences disturbing and persistent thoughts, images, and/or perceptions of the  traumatic event.: (Status: improved). Experiences frequent nightmares.:  (Status: improved). Has been exposed to a traumatic event involving actual or  perceived threat of death or serious injury.: (Status: maintained).  Hypervigilance (e.g., feeling constantly on edge, experiencing concentration difficulties, having trouble falling or staying asleep, exhibiting a general state of irritability).: (Status:  improved). Impairment in social, occupational, or other areas of functioning.:  (Status: improved). Intentionally avoids activities, places, people, or objects (e.g., up-armored vehicles) that evoke memories of the event.:  (Status: improved). Intentionally avoids  thoughts, feelings, or discussions related to the traumatic event.: (Status:  improved). Reports difficulty concentrating as well as feelings of guilt.:   (Status: improved). Reports response of intense fear, helplessness, or horror to the traumatic event.: (Status: improved). Symptoms present more than one month.:  (  Status: maintained).     Problems Addressed:  Anxiety, Posttraumatic Stress Disorder (PTSD),  Goals:  LTG:  1. Enhance ability to effectively cope with the full variety of life's worries  and anxieties.  90% 2. No longer avoids persons, places, activities, and objects that are  reminiscent of the traumatic event.  90% 3. No longer experiences intrusive event recollections, avoidance of event  reminders, intense arousal, or disinterest in activities or  relationships.  100% 4. Stabilize anxiety level while increasing ability to function on a daily  basis.  90% 5. Thinks about or openly discusses the traumatic event with others  without experiencing psychological or physiological distress.  80%  STG: 1.Identify and engage in pleasant activities on a daily basis..  90% 2.Identify, challenge, and replace biased, fearful self-talk with positive, realistic, and empowering self talk 3.  Participate in Cognitive Therapy to help identify, challenge, and replace biased, negative, and selfdefeating thoughts resulting from the trauma.  80 % 4.  Participate in Eye Movement Desensitization and Reprocessing (EMDR) to reduce emotional distress  related to traumatic thoughts, feelings, and images.  90%  Target Date:  02/16/2025 Frequency: Every three weeks Modality:Individual Therapy Interventions by Therapist:  CBT, EMDR, insight oriented therapy, problem solving  Patient approved treatment plan and is progressing nicely.  Fatime Biswell G Genoveva Singleton, LCSW

## 2024-06-24 NOTE — Telephone Encounter (Signed)
 Pt seen on 7/10: 1. Wellbutrin  XL 450mg  daily - in the am - may decrease to 300mg  daily 2. Cymbalta  60mg  BID 3. Trazadone 100mg  - 2 at hs  4. Vraylar  3mg  daily though patient assistance.  5. Clonazepam  0.5mg  BID - taking at bedtime 6. Ritalin  10mg  BID   She has seen her PCP who recommends she change from Ritalin  to Provigil. She reports she doesn't have a dx of ADHD (she does), but was taking the Ritalin  for daytime somnolence and it isn't working well enough. She last filled Ritalin  6/27. Will pend as appropriate.

## 2024-06-25 ENCOUNTER — Telehealth: Payer: Self-pay | Admitting: Pharmacist

## 2024-06-25 NOTE — Telephone Encounter (Signed)
 Pt said her PCP prescribed the Provigil.

## 2024-06-25 NOTE — Progress Notes (Signed)
 Attempted to contact patient for medication management. Left HIPAA compliant message for patient to return my call at their convenience.    Reynold Bowen, PharmD Clinical Pharmacist [place] Direct Dial: [phone]

## 2024-06-25 NOTE — Telephone Encounter (Signed)
LVM to [facility] with pharmacy.

## 2024-06-25 NOTE — Telephone Encounter (Signed)
 PAP: Patient assistance application for Ubrelvy  has been approved by PAP Companies: [employer] from 06/25/2024 to 12/01/2024. Medication should be delivered to PAP Delivery: Home. For further shipping updates, please contact [employer] at [phone]. Patient ID is: no ID given

## 2024-06-25 NOTE — Progress Notes (Signed)
   06/25/2024  Patient ID: Paige [name] Endo, female   DOB: [birth year], 58 y.o.   MRN: [id number]  Telephonic engagement with Ms. Endo today. Notified patient of approvals for Airsupra , approved through 12/01/2024 and eligible for reapplication for 2026 later this year. Airsupra  inhalers will ship to patient's home within 7 to 10 business days. Notified patient of approval for Ubrelvy  assistance through 12/01/2025. [employer] program will outreach patient directly to set up first shipment. Advised patient, she will be responsible for updating shipping address and placing refills. Patient verbalized understanding.   Patient has medication question regarding Modafinil start. Patient reports decreasing her Bupropion  XL 150 mg tablets from 3 tablets to 2 tablets about two weeks ago under the supervision of Angeline Sayers, NP with behavior health.  Inquires about how much time she should wait to start the Modafinil. I have advised patient that dose changes with medication in this category can take up to 4 to 6 weeks for full efficacy to be seen. Patient reports potentially coming off of Bupropion  completely with slow titration due to lack of desired effect and weight gain.   Patient inquires if she can start Modafinil, advised since she is under supervision of a provider and tapering Bupropion  slowly, I see no reason she can not safely transition from methylphenidate  to Modafinil. Patient plans for today, 06/25/2024 to be her last dose of methylphenidate  and will start modafinil tomorrow, 06/26/2024. Advised patient I will make PCP aware. Patient encouraged to reach out if needs arise.   Annabella Galla, PharmD Clinical Pharmacist [place] Direct Dial: [phone]

## 2024-07-01 ENCOUNTER — Encounter (INDEPENDENT_AMBULATORY_CARE_PROVIDER_SITE_OTHER): Payer: Self-pay | Admitting: Family Medicine

## 2024-07-01 ENCOUNTER — Ambulatory Visit (INDEPENDENT_AMBULATORY_CARE_PROVIDER_SITE_OTHER): Admitting: Family Medicine

## 2024-07-01 VITALS — BP 93/63 | HR 69 | Temp 98.5°F | Ht 67.0 in | Wt 243.0 lb

## 2024-07-01 DIAGNOSIS — Z6838 Body mass index (BMI) 38.0-38.9, adult: Secondary | ICD-10-CM | POA: Diagnosis not present

## 2024-07-01 DIAGNOSIS — E88819 Insulin resistance, unspecified: Secondary | ICD-10-CM | POA: Diagnosis not present

## 2024-07-01 DIAGNOSIS — E65 Localized adiposity: Secondary | ICD-10-CM

## 2024-07-01 DIAGNOSIS — E559 Vitamin D deficiency, unspecified: Secondary | ICD-10-CM | POA: Diagnosis not present

## 2024-07-01 NOTE — Progress Notes (Signed)
 Paige [name], D.O.  ABFM, ABOM Specializing in Clinical Bariatric Medicine  Office located at: [address] [place], [place]  [address]   Assessment and Plan:    FOR THE DISEASE OF OBESITY: Morbid obesity (HCC) Starting BMI 40.57 BMI 38.0-38.9,adult current 38.06 Assessment & Plan: Since last office visit on 06/08/24 patient's muscle mass has decreased by 2.6 lbs. Fat mass has decreased by 0.8 lbs. Total body water has increased by 2.8 lbs. Counseling done on how various foods will affect these numbers and how to maximize success  Total lbs lost to date: 16 lbs Total weight loss percentage to date: -6.18%   Recommended Dietary Goals Paige is currently in the action stage of change. As such, Paige [name] goal is to continue weight management plan.  Paige [name] has agreed to: continue current plan   Behavioral Intervention We discussed the following today: increasing lean protein intake to established goals, decreasing simple carbohydrates , increasing vegetables, increasing lower glycemic fruits, work on tracking and journaling calories using tracking application, keeping healthy foods at home, and using GPT or another AI platform for recipe ideas- searching low calorie, low carb, high protein chicken recipes etc  Additional resources provided today: Handout on CAT 2 meal plan, Handout on CAT 1-2 breakfast options, Handout on CAT 1-2 lunch options, Handout on protein equivalents of 2 ounces of meat or seafood, and Handout on protein content of various foods  Evidence-based interventions for health behavior change were utilized today including the discussion of self monitoring techniques, problem-solving barriers and SMART goal setting techniques.  Regarding patient's less desirable eating habits and patterns, we employed the technique of small changes.   Pt will specifically work on: increasing Paige [name] protein intake to 6 ounces at dinner    Recommended Physical Activity Goals Paige [name]  has been advised to work up to 300-450 minutes of moderate intensity aerobic activity a week and strengthening exercises 2-3 times per week for cardiovascular health, weight loss maintenance and preservation of muscle mass.   Paige [name] has agreed to: Continue current level of physical activity  and Increase physical activity in their day and reduce sedentary time (increase NEAT).   Pharmacotherapy We both agreed to: Continue with current nutritional and behavioral strategies   ASSOCIATED CONDITIONS ADDRESSED TODAY:  Insulin  resistance - new onset Assessment & Plan: Lab Results  Component Value Date   HGBA1C 5.5 05/25/2024   INSULIN  5.4 05/25/2024    Not currently on pharmacologic treatment. Diet/lifestyle controlled. Hunger/cravings well controlled. Labs were extensively reviewed at [facility].   Focus on lean proteins and reducing simple carb intake as first line of treatment. May consider started Metformin for better control of insulin  sensitivity and cravings in the future, if needed. Work on focusing on lean protein and reducing simple carbs as part of first line of tx.     Visceral obesity Assessment & Plan: Pt visceral fat rating has gone down one point since starting the program on 05/25/24. Went from 1 to 15 today.  Ideal visceral fat rating is less than <12. Educated on MASLD and it potentially progressing to fibrosis and cirrhosis if not well controlled. Focus on lean protein to promote improving muscle mass. Increase exercise to moderate intensity to improve cardiovascular health and promote weight loss.     Vitamin D  deficiency Assessment & Plan: Lab Results  Component Value Date   VD25OH 81.5 05/25/2024   Reviewed last obtained labs: Vit D at goal at 81.5. On OTC vit D 50 mcg daily.  Good compliance and tolerance to supplementation. No acute concerns.   Continue with current supplementation. No changes made today.     Follow up:   Return in about 2 weeks (around 07/15/2024)  for f/u on 07/15/2024 at 2:40 PM. Paige [name] was informed of the importance of frequent follow up visits to maximize Paige [name] success with intensive lifestyle modifications for Paige [name] multiple health conditions.  Subjective:   Chief complaint: Obesity Paige [name] is here to discuss Paige [name] progress with Paige [name] obesity treatment plan. Paige [name] is on the Category 2 Plan w B/L options and option to journal 1200-1300 calories and 85++ g of protein per day. Paige [name] states Paige [name] is following Paige [name] eating plan approximately 90% of the time. Paige [name] states Paige [name] is working out 15-20 minutes 7 days per week.    Interval History:  Paige [name] is here for a follow up office visit. Since last OV on 06/08/24, Paige [name] is down 4 lbs. Paige [name] started journaling the foods Paige [name] was eating. Today, Paige [name] started measuring and weighing Paige [name] foods and logging Paige [name] calories and grams of protein. Paige [name] struggles to eating 2 cups of vegetables at dinner. Paige [name] would like variety with vegetables, but mainly prefers cucumbers. Paige [name] at times will switch the ounces of protein for lunch and dinner, eating more protein at lunch than dinner. Paige [name] reports difficulty swallowing bread recently, no difficulty with other foods. Paige [name] has had a swallowing test done, which Paige [name] states was normal.   Pharmacotherapy that aid with weight loss: Paige [name] is currently taking Wellbutrin  XL 300 mg daily.    Review of Systems:  Pertinent positives were addressed with patient today.  Reviewed by clinician on day of visit: allergies, medications, problem list, medical history, surgical history, family history, social history, and previous encounter notes.  Weight Summary and Biometrics   Weight Lost Since Last Visit: 4lb  Weight Gained Since Last Visit: 0lb   Vitals Temp: 98.5 F (36.9 C) BP: (!) 93/3 Pulse Rate: 69 SpO2: 95 %   Anthropometric Measurements Height: 5' 7 (1.702 m) Weight: 243 lb (110.2 kg) BMI (Calculated): 38.05 Weight at Last Visit: 247lb Weight Lost Since Last Visit:  4lb Weight Gained Since Last Visit: 0lb Starting Weight: 259lb Total Weight Loss (lbs): 16 lb (7.258 kg) Peak Weight: 316lb Waist Measurement : 51 inches   Body Composition  Body Fat %: 49.8 % Fat Mass (lbs): 121.2 lbs Muscle Mass (lbs): 116 lbs Total Body Water (lbs): 92.2 lbs Visceral Fat Rating : 15   Other Clinical Data Fasting: No Labs: No Today's Visit #: 3 Starting Date: 05/25/24 Comments: Cat 2    Objective:   PHYSICAL EXAM: Blood pressure (!) 93/3, pulse 69, temperature 98.5 F (36.9 C), height 5' 7 (1.702 m), weight 243 lb (110.2 kg), SpO2 95%. Body mass index is 38.06 kg/m.  General: Paige [name] is overweight, cooperative and in no acute distress. PSYCH: Has normal mood, affect and thought process.   HEENT: EOMI, sclerae are anicteric. Lungs: Normal breathing effort, no conversational dyspnea. Extremities: Moves * 4 Neurologic: A and O * 3, good insight  DIAGNOSTIC DATA REVIEWED: BMET    Component Value Date/Time   NA 141 05/25/2024 1214   K 4.2 05/25/2024 1214   CL 99 05/25/2024 1214   CO2 27 05/25/2024 1214   GLUCOSE 95 05/25/2024 1214   GLUCOSE 97 05/26/2019 1046   BUN 16 05/25/2024 1214   CREATININE 0.86 05/25/2024 1214   CALCIUM 9.2 05/25/2024 1214   Lab Results  Component Value Date  HGBA1C 5.5 05/25/2024   Lab Results  Component Value Date   INSULIN  5.4 05/25/2024   Lab Results  Component Value Date   TSH 1.430 05/25/2024   CBC    Component Value Date/Time   WBC 7.0 05/25/2024 1214   WBC 6.9 05/26/2019 1046   RBC 4.39 05/25/2024 1214   RBC 4.42 05/26/2019 1046   HGB 13.4 05/25/2024 1214   HCT 42.3 05/25/2024 1214   PLT 213 05/25/2024 1214   MCV 96 05/25/2024 1214   MCH 30.5 05/25/2024 1214   MCHC 31.7 05/25/2024 1214   MCHC 32.5 05/26/2019 1046   RDW 12.9 05/25/2024 1214   Iron  Studies    Component Value Date/Time   IRON  94 05/25/2024 1214   TIBC 334 05/25/2024 1214   FERRITIN 49 05/25/2024 1214   IRONPCTSAT 28  05/25/2024 1214   Lipid Panel     Component Value Date/Time   CHOL 170 05/25/2024 1214   TRIG 61 05/25/2024 1214   HDL 64 05/25/2024 1214   CHOLHDL 2.7 05/25/2024 1214   LDLCALC 94 05/25/2024 1214   Hepatic Function Panel     Component Value Date/Time   PROT 6.3 05/25/2024 1214   ALBUMIN 4.2 05/25/2024 1214   AST 32 05/25/2024 1214   ALT 38 (H) 05/25/2024 1214   ALKPHOS 103 05/25/2024 1214   BILITOT 0.3 05/25/2024 1214   BILIDIR 0.1 05/12/2017 1222      Component Value Date/Time   TSH 1.430 05/25/2024 1214   Nutritional Lab Results  Component Value Date   VD25OH 81.5 05/25/2024    Attestations:   I, Vernell Forest, acting as a medical scribe for Paige Jenkins, DO., have compiled all relevant documentation for today's office visit on behalf of Paige Jenkins, DO, while in the presence of [employer], DO.  I have reviewed the above documentation for accuracy and completeness, and I agree with the above. Paige JINNY [name], D.O.  The 21st Century Cures Act was signed into law in 2016 which includes the topic of electronic health records.  This provides immediate access to information in MyChart.  This includes consultation notes, operative notes, office notes, lab results and pathology reports.  If you have any questions about what you read please let us  know at your next visit so we can discuss your concerns and take corrective action if need be.  We are right here with you.

## 2024-07-05 NOTE — Telephone Encounter (Signed)
 Recevied fax from [employer] patient needs to provide proof of income as well as PA approval/denial from [employer]

## 2024-07-06 ENCOUNTER — Telehealth: Payer: Self-pay | Admitting: Pharmacist

## 2024-07-06 NOTE — Progress Notes (Signed)
   07/06/2024  Patient ID: Paige [name], female   DOB: 12/18/1965, 58 y.o.   MRN: [id number]  Telephonic engagement with [name] today regarding follow up information requested from [employer]. Patient will bring proof of household income to [facility] tomorrow. I will ask office to assist with a PA determination letter to submit back to [employer] on patient's behalf.   Annabella Galla, PharmD Clinical Pharmacist [place] Direct Dial: [phone]

## 2024-07-07 ENCOUNTER — Other Ambulatory Visit (HOSPITAL_COMMUNITY): Payer: Self-pay

## 2024-07-07 DIAGNOSIS — M5416 Radiculopathy, lumbar region: Secondary | ICD-10-CM | POA: Diagnosis not present

## 2024-07-07 DIAGNOSIS — M419 Scoliosis, unspecified: Secondary | ICD-10-CM | POA: Diagnosis not present

## 2024-07-07 DIAGNOSIS — G894 Chronic pain syndrome: Secondary | ICD-10-CM | POA: Diagnosis not present

## 2024-07-08 ENCOUNTER — Telehealth: Payer: Self-pay

## 2024-07-08 ENCOUNTER — Other Ambulatory Visit (HOSPITAL_COMMUNITY): Payer: Self-pay

## 2024-07-08 ENCOUNTER — Telehealth: Payer: Self-pay | Admitting: Pharmacy Technician

## 2024-07-08 NOTE — Telephone Encounter (Signed)
 Pharmacy Patient Advocate Encounter  Received notification from [facility] that Prior Authorization for AIMOVIG  70MG  has been APPROVED from 8.7.25 to 8.7.26. Ran test claim, Copay is $45. This test claim was processed through [facility]- copay amounts may vary at other pharmacies due to pharmacy/plan contracts, or as the patient moves through the different stages of their insurance plan.   PA #/Case ID/Reference #: [id number]

## 2024-07-08 NOTE — Telephone Encounter (Signed)
 Pharmacy Patient Advocate Encounter   Received notification from Patient Pharmacy that prior authorization for AIMOVIG  70MG  is required/requested.   Insurance verification completed.   The patient is insured through [facility] .   Per test claim: PA required; PA submitted to above mentioned insurance via CoverMyMeds Key/confirmation #/EOC [facility] Status is pending

## 2024-07-08 NOTE — Telephone Encounter (Signed)
 Dr Darlean are there any alternatives to airsupra , insurance does not cover this medications ?

## 2024-07-09 ENCOUNTER — Telehealth: Payer: Self-pay

## 2024-07-09 ENCOUNTER — Other Ambulatory Visit: Payer: Self-pay

## 2024-07-09 MED ORDER — AIRSUPRA 90-80 MCG/ACT IN AERO: 1.0000 | INHALATION_SPRAY | Freq: Two times a day (BID) | RESPIRATORY_TRACT | Status: DC

## 2024-07-09 MED ORDER — AIRSUPRA 90-80 MCG/ACT IN AERO
1.0000 | INHALATION_SPRAY | Freq: Two times a day (BID) | RESPIRATORY_TRACT | 11 refills | Status: DC
Start: 2024-07-09 — End: 2024-09-08

## 2024-07-09 MED ORDER — MOMETASONE FURO-FORMOTEROL FUM 100-5 MCG/ACT IN AERO: 2.0000 | INHALATION_SPRAY | Freq: Every day | RESPIRATORY_TRACT | 11 refills | Status: DC

## 2024-07-09 NOTE — Telephone Encounter (Signed)
 Appointment scheduled.

## 2024-07-09 NOTE — Telephone Encounter (Signed)
 Spoke with pt and she is getting this medication covered by [employer] . NFN

## 2024-07-09 NOTE — Telephone Encounter (Signed)
 Pt needs appt with sleep providers in [place] please

## 2024-07-13 ENCOUNTER — Ambulatory Visit (INDEPENDENT_AMBULATORY_CARE_PROVIDER_SITE_OTHER): Admitting: Obstetrics and Gynecology

## 2024-07-13 ENCOUNTER — Encounter: Payer: Self-pay | Admitting: Obstetrics and Gynecology

## 2024-07-13 VITALS — BP 91/64 | HR 72

## 2024-07-13 DIAGNOSIS — M6289 Other specified disorders of muscle: Secondary | ICD-10-CM | POA: Diagnosis not present

## 2024-07-13 DIAGNOSIS — K5904 Chronic idiopathic constipation: Secondary | ICD-10-CM

## 2024-07-13 DIAGNOSIS — N952 Postmenopausal atrophic vaginitis: Secondary | ICD-10-CM | POA: Diagnosis not present

## 2024-07-13 NOTE — Assessment & Plan Note (Signed)
-   Restart estrace  cream 2-3 times per week. - Can use coconut oil or vitamin E as needed for dryness

## 2024-07-13 NOTE — Patient Instructions (Addendum)
 For constipation:  Start with milk of magnesia- can take a dose every 12 hours until bowels are cleaned out Then start miralax 1/2 cap daily or every other day After a few days, add in metamucil daily to help with stool consistency   Vulvovaginal moisturizer Options: Vitamin E oil (pump or capsule) or cream (Gene's Vit E Cream) Coconut oil Silicone-based lubricant for use during intercourse (wet platinum is a brand available at most drugstores) Crisco Consider the ingredients of the product - the fewer the ingredients the better!  Directions for Use: Clean and dry your hands Gently dab the vulvar/vaginal area dry as needed Apply a "pea-sized" amount of the moisturizer onto your fingertip Using you other hand, open the labia  Apply the moisturizer to the vulvar/vaginal tissues Wear loose fitting underwear/clothing if possible following application Use moisturize up to 3 times daily as desired.

## 2024-07-13 NOTE — Progress Notes (Signed)
 [facility] Return Visit  SUBJECTIVE  History of Present Illness: Paige [name] is a 58 y.o. female seen in follow-up for high tone pelvic floor, incontinence, constipation. Last visit was referred to pelvic PT but she has her first appt in Sept.   Has been constipated recently- just passing rocks. Worried about making stool too loose and causing bowel leakage.   Tried the vaginal valium  but did not notice much of a difference in symptoms. She has been doing some home PT virtual sessions to help with pelvic floor relaxation and that has helped with the voiding some.   She had been using the vaginal estrogen but felt it made her more incontinent. She thinks it was just poor timing. She started again last night.   Past Medical History: Patient  has a past medical history of Allergic rhinitis, Allergy, Anemia, Anxiety disorder, Back pain, Bilateral swelling of feet, Bronchitis, Cancer (HCC), Chronic fatigue, Chronic fatigue, Chronic headaches, Chronic pain, Constipation, Depression, Endometriosis, Family history of colon cancer, Fibromyalgia, Fibromyalgia, Gallbladder problem, GERD (gastroesophageal reflux disease), Hyperlipidemia, Hypothyroidism, IBS (irritable bowel syndrome), Insomnia, Iron  deficiency, Joint pain, Laryngopharyngeal reflux (LPR), Obesity, Osteoarthritis, Personal history of colonic polyps (04/2006), PONV (postoperative nausea and vomiting), RLS (restless legs syndrome), Sleep apnea, SOB (shortness of breath), Swelling, Vitamin B12 deficiency, and Vitamin D  deficiency.   Past Surgical History: She  has a past surgical history that includes Roux-en-Y Gastric Bypass (04/2004); Appendectomy (1993); Cholecystectomy (1999); Repair ankle ligament (Left, 2001); Anterior cruciate ligament repair (Left, 2003); Colonoscopy (2009); Carpal tunnel release (Left, 09/25/2017); Tonsillectomy and adenoidectomy (1976); Carpal tunnel release (Right); Polypectomy; Back surgery  (03/2024); Lumbar laminectomy; Panniculectomy; Trigger finger release; and De Quervain's release.   Medications: She has a current medication list which includes the following prescription(s): airsupra , airsupra , bupropion , cariprazine , clonazepam , diazepam , docusate sodium , duloxetine , aimovig , estradiol , estradiol , fusion, flonase sensimist, ibuprofen, ipratropium, levothyroxine , liothyronine, magnesium citrate, multiple vitamins-minerals, nystatin cream, olopatadine hcl, omeprazole , ondansetron , oxycodone  hcl, potassium chloride , progesterone , promethazine , systane complete, ropinirole , ibsrela , tizanidine, torsemide, trazodone , ubrelvy , and vitamin d -vitamin k.   Allergies: Patient is allergic to tape, topiramate , cyclobenzaprine, flexeril [cyclobenzaprine hcl], meloxicam, naltrexone, pork (porcine) protein, sumatriptan, wound dressing adhesive, brexpiprazole, glycopyrrolate , imitrex [sumatriptan base], lamotrigine , and sulfa antibiotics.   Social History: Patient  reports that she has never smoked. She has never used smokeless tobacco. She reports that she does not currently use alcohol . She reports that she does not use drugs.     OBJECTIVE     Physical Exam: Vitals:   07/13/24 0811  BP: 91/64  Pulse: 72   Gen: No apparent distress, A&O x 3.  Detailed Urogynecologic Evaluation:  Deferred.    ASSESSMENT AND PLAN    Ms. Chisholm is a 58 y.o. with:  1. Chronic idiopathic constipation   2. Vaginal atrophy   3. High-tone pelvic floor dysfunction     Chronic idiopathic constipation Assessment & Plan: - Start with milk of magnesia- can take a dose every 12 hours until bowels are cleaned out - Then start miralax 1/2 cap daily or every other day. Can increase to full cap daily if needed - After a few days, add in metamucil daily to help with stool consistency. We discussed this can help with both constipation and preventing loose stools/ bowel leakage.   Vaginal  atrophy Assessment & Plan: - Restart estrace  cream 2-3 times per week. - Can use coconut oil or vitamin E as needed for dryness   High-tone pelvic floor dysfunction Assessment & Plan: -  Continue with pelvic relaxation exercises and has pelvic PT scheduled next month   Return for f/u december 2025.    Rosaline LOISE Caper, MD  Time spent: I spent 20 minutes dedicated to the care of this patient on the date of this encounter to include pre-visit review of records, face-to-face time with the patient and post visit documentation.

## 2024-07-13 NOTE — Assessment & Plan Note (Signed)
-   Continue with pelvic relaxation exercises and has pelvic PT scheduled next month

## 2024-07-13 NOTE — Assessment & Plan Note (Signed)
-   Start with milk of magnesia- can take a dose every 12 hours until bowels are cleaned out - Then start miralax 1/2 cap daily or every other day. Can increase to full cap daily if needed - After a few days, add in metamucil daily to help with stool consistency. We discussed this can help with both constipation and preventing loose stools/ bowel leakage.

## 2024-07-14 ENCOUNTER — Other Ambulatory Visit: Payer: Self-pay | Admitting: Medical Genetics

## 2024-07-14 NOTE — Progress Notes (Signed)
 Pharmacy Medication Assistance Program Note    07/14/2024  Patient ID: DERETHA ERTLE, female   DOB: 07/14/1966, 58 y.o.   MRN: [id number]     06/03/2024  Outreach Medication One  Initial Outreach Date (Medication One) 06/03/2024  Manufacturer Medication One [employer]  [facility] Other [facility] [employer] [facility]  Type of Assistance Manufacturer Assistance  Date Application Sent to Patient 06/10/2024  Application Items Requested Application;Proof of Income  Date Application Sent to Prescriber 06/03/2024  Name of Prescriber Aleck Newness  Date Application Received From Patient 06/21/2024  Application Items Received From Patient Application  Date Application Received From Provider 06/21/2024  Date Application Submitted to Manufacturer 06/21/2024  Method Application Sent to Manufacturer Fax  Patient Assistance Determination Approved  Approval Start Date 06/21/2024  Approval End Date 12/01/2024       06/03/2024  Outreach Medication Two  Initial Outreach Date (Medication Two) 06/03/2024  Manufacturer Medication Two [facility] Other [facility]  Type of Assistance Manufacturer Assistance  Date Application Sent to Patient 06/10/2024  Application Items Requested Application;Proof of Income  Date Application Sent to Prescriber 06/03/2024  Name of Prescriber Aleck Newness  Date Application Received From Patient 06/21/2024  Date Application Received From Provider 06/21/2024  Method Application Sent to Manufacturer Fax  Date Application Submitted to Manufacturer 06/21/2024  Patient Assistance Determination Approved  Approval Start Date 06/25/2024       06/03/2024  Outreach Medication Three  Manufacturer Medication Three Other Manufacturer/Drug  Other Drugs aimovig -[employer]  Type of Assistance Manufacturer Assistance  Date Application Sent to Patient 06/10/2024  Application Items Requested Application;Proof of Income  Date Application Sent to Prescriber 06/03/2024  Name of  Prescriber Aleck Prochnua  Date Application Received From Patient 06/21/2024  Application Items Received From Patient Application;Proof of Income  Date Application Received From Provider 06/21/2024  Date Application Submitted to Manufacturer 06/21/2024  Method Application Sent to Manufacturer Fax  Patient Assistance Determination Approved  Approval Start Date 07/14/2024  Approval End Date 12/01/2024  Patient Notification Method Telephone Call     SIGNATURE

## 2024-07-14 NOTE — Telephone Encounter (Signed)
 PAP: Patient assistance application for Aimovig  has been approved by PAP Companies: [employer] from 07/14/2024 to 12/01/2024. Medication should be delivered to PAP Delivery: Home. For further shipping updates, please contact [employer] [phone]. Patient ID is: [id number]

## 2024-07-15 ENCOUNTER — Ambulatory Visit (INDEPENDENT_AMBULATORY_CARE_PROVIDER_SITE_OTHER): Admitting: Family Medicine

## 2024-07-15 ENCOUNTER — Encounter (INDEPENDENT_AMBULATORY_CARE_PROVIDER_SITE_OTHER): Payer: Self-pay | Admitting: Family Medicine

## 2024-07-15 VITALS — BP 108/70 | HR 79 | Temp 97.6°F | Ht 67.0 in | Wt 239.0 lb

## 2024-07-15 DIAGNOSIS — Z6837 Body mass index (BMI) 37.0-37.9, adult: Secondary | ICD-10-CM | POA: Diagnosis not present

## 2024-07-15 DIAGNOSIS — E88819 Insulin resistance, unspecified: Secondary | ICD-10-CM

## 2024-07-15 DIAGNOSIS — Z6838 Body mass index (BMI) 38.0-38.9, adult: Secondary | ICD-10-CM

## 2024-07-15 DIAGNOSIS — E559 Vitamin D deficiency, unspecified: Secondary | ICD-10-CM

## 2024-07-15 NOTE — Progress Notes (Signed)
 Paige [name], D.O.  [facility], [facility] Specializing in Clinical Bariatric Medicine  Office located at: [address] [place], [place]  [address]     Assessment and Plan:     FOR THE DISEASE OF OBESITY:  BMI 38.0-38.9,adult current 37.42 Morbid obesity (HCC) Starting BMI 40.57 Assessment & Plan: Since last office visit on 07/01/24 patient's muscle mass has decreased by 1.8 lbs. Fat mass has decreased by 2 lbs. Total body water has decreased by 2.2 lbs. Counseling done on how various foods will affect these numbers and how to maximize success  Total lbs lost to date: 20 lbs Total weight loss percentage to date: -7.72 %   Recommended Dietary Goals Iram is currently in the action stage of change. As such, her goal is to continue weight management plan.  She has agreed to: continue current plan   Behavioral Intervention We discussed the following today: increasing lean protein intake to established goals, decreasing simple carbohydrates , and decreasing eating out or consumption of processed foods, and making healthy choices when eating convenient foods  Additional resources provided today: Handout on Daily Food Journaling Log and Handout on benefits of metformin for weight loss  Evidence-based interventions for health behavior change were utilized today including the discussion of self monitoring techniques, problem-solving barriers and SMART goal setting techniques.   Regarding patient's less desirable eating habits and patterns, we employed the technique of small changes.   Pt will specifically work on: Decrease processed meat (sausages) and increased water intake.    Recommended Physical Activity Goals Shoua has been advised to work up to 300-450 minutes of moderate intensity aerobic activity a week and strengthening exercises 2-3 times per week for cardiovascular health, weight loss maintenance and preservation of muscle mass.   She has agreed to: Continue current  level of physical activity  and Increase physical activity in their day and reduce sedentary time (increase NEAT).   Pharmacotherapy We both agreed to: Continue with current nutritional and behavioral strategies continue with meds that aid in wt loss (Wellbutrin )   ASSOCIATED CONDITIONS ADDRESSED TODAY:  Insulin  resistance Assessment & Plan: Lab Results  Component Value Date   HGBA1C 5.5 05/25/2024   INSULIN  5.4 05/25/2024    Diet/lifestyle intervention controlled. No meds currently. Hunger and cravings are overall well controlled. No acute concerns reported today.   Increase lean protein sources (chicken and tuna) and limit protein supplements such as protein shakes. Reviewed the benefits of eating protein from high protein food sources. Discussed the role of protein as it relates to improving insulin  sensitivity and promoting wt loss. Continue working on diet/exercise in an effort to prevent progression to prediabetes or diabetes. Will continue monitoring.     Vitamin D  deficiency Assessment & Plan: Lab Results  Component Value Date   VD25OH 81.5 05/25/2024   Currently taking OTC Vit K2 100 mcg/Vit D 50 mcg per gummy every other day. Decreased dose from daily to every other day on 06/08/24. Tolerating well with no adverse SE. No acute concerns.  Continue with current supplementation at current dose. Will recheck labs periodically for continued monitoring.     Follow up:   Return for f/u on 08/11/2024 at 2:40 PM. Make additional f/u in 3-4 weeks if pt desires.  She was informed of the importance of frequent follow up visits to maximize her success with intensive lifestyle modifications for her multiple health conditions.  Subjective:   Chief complaint: Obesity Paige [name] is here to discuss her progress with  her obesity treatment plan. She is on the Category 2 Plan w B/L options and option to journal 1200-1300 calories and 85++ g of protein per day.  States she is following her  eating plan approximately 95% of the time. She states she is walking 30 minutes 7 days per week.   Interval History:  Paige [name] is here for a follow up office visit. Since last OV on 07/01/24, she is down 4 lbs. She has been journaling her daily calorie and protein intake. She is eating 100 g of protein per day. 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 Reports eating sausage/processed meats often. She has been using her CPAP nightly. Still struggling with sleep/insomnia.   Pharmacotherapy that aid with weight loss: She is currently taking Wellbutrin  XL .    Review of Systems:  Pertinent positives were addressed with patient today.  Reviewed by clinician on day of visit: allergies, medications, problem list, medical history, surgical history, family history, social history, and previous encounter notes.  Weight Summary and Biometrics   Weight Lost Since Last Visit: 4lb  Weight Gained Since Last Visit: 0    Vitals Temp: 97.6 F (36.4 C) BP: 108/70 Pulse Rate: 79 SpO2: 95 %   Anthropometric Measurements Height: 5' 7 (1.702 m) Weight: 239 lb (108.4 kg) BMI (Calculated): 37.42 Weight at Last Visit: 243lb Weight Lost Since Last Visit: 4lb Weight Gained Since Last Visit: 0 Starting Weight: 259lb Total Weight Loss (lbs): 20 lb (9.072 kg) Peak Weight: 316lb Waist Measurement : 51 inches   Body Composition  Body Fat %: 49.8 % Fat Mass (lbs): 119.2 lbs Muscle Mass (lbs): 114.2 lbs Total Body Water (lbs): 94.4 lbs Visceral Fat Rating : 15   Other Clinical Data Fasting: no Labs: no Today's Visit #: 4 Starting Date: 05/25/24    Objective:   PHYSICAL EXAM: Blood pressure 108/70, pulse 79, temperature 97.6 F (36.4 C), height 5' 7 (1.702 m), weight 239 lb (108.4 kg), SpO2 95%. Body mass index is 37.43 kg/m.  General: she is overweight, cooperative and in no  acute distress. PSYCH: Has normal mood, affect and thought process.   HEENT: EOMI, sclerae are anicteric. Lungs: Normal breathing effort, no conversational dyspnea. Extremities: Moves * 4 Neurologic: A and O * 3, good insight  DIAGNOSTIC DATA REVIEWED: BMET    Component Value Date/Time   NA 141 05/25/2024 1214   K 4.2 05/25/2024 1214   CL 99 05/25/2024 1214   CO2 27 05/25/2024 1214   GLUCOSE 95 05/25/2024 1214   GLUCOSE 97 05/26/2019 1046   BUN 16 05/25/2024 1214   CREATININE 0.86 05/25/2024 1214   CALCIUM 9.2 05/25/2024 1214   Lab Results  Component Value Date   HGBA1C 5.5 05/25/2024   Lab Results  Component Value Date   INSULIN  5.4 05/25/2024   Lab Results  Component Value Date   TSH 1.430 05/25/2024   CBC    Component Value Date/Time   WBC 7.0 05/25/2024 1214   WBC 6.9 05/26/2019 1046   RBC 4.39 05/25/2024 1214   RBC 4.42 05/26/2019 1046   HGB 13.4 05/25/2024 1214   HCT 42.3 05/25/2024 1214   PLT 213 05/25/2024 1214   MCV 96 05/25/2024 1214   MCH 30.5 05/25/2024 1214   MCHC 31.7 05/25/2024 1214   MCHC 32.5 05/26/2019 1046   RDW 12.9 05/25/2024 1214   Iron  Studies  Component Value Date/Time   IRON  94 05/25/2024 1214   TIBC 334 05/25/2024 1214   FERRITIN 49 05/25/2024 1214   IRONPCTSAT 28 05/25/2024 1214   Lipid Panel     Component Value Date/Time   CHOL 170 05/25/2024 1214   TRIG 61 05/25/2024 1214   HDL 64 05/25/2024 1214   CHOLHDL 2.7 05/25/2024 1214   LDLCALC 94 05/25/2024 1214   Hepatic Function Panel     Component Value Date/Time   PROT 6.3 05/25/2024 1214   ALBUMIN 4.2 05/25/2024 1214   AST 32 05/25/2024 1214   ALT 38 (H) 05/25/2024 1214   ALKPHOS 103 05/25/2024 1214   BILITOT 0.3 05/25/2024 1214   BILIDIR 0.1 05/12/2017 1222      Component Value Date/Time   TSH 1.430 05/25/2024 1214   Nutritional Lab Results  Component Value Date   VD25OH 81.5 05/25/2024    Attestations:   I, Vernell Forest, acting as a medical scribe  for Paige Jenkins, DO., have compiled all relevant documentation for today's office visit on behalf of Paige Jenkins, DO, while in the presence of [employer], DO.  I have reviewed the above documentation for accuracy and completeness, and I agree with the above. Paige JINNY [name], D.O.  The 21st Century Cures Act was signed into law in 2016 which includes the topic of electronic health records.  This provides immediate access to information in MyChart.  This includes consultation notes, operative notes, office notes, lab results and pathology reports.  If you have any questions about what you read please let us  know at your next visit so we can discuss your concerns and take corrective action if need be.  We are right here with you.

## 2024-07-20 ENCOUNTER — Ambulatory Visit: Admitting: Obstetrics and Gynecology

## 2024-07-21 ENCOUNTER — Ambulatory Visit: Admitting: Psychology

## 2024-07-21 DIAGNOSIS — F411 Generalized anxiety disorder: Secondary | ICD-10-CM

## 2024-07-21 DIAGNOSIS — F3341 Major depressive disorder, recurrent, in partial remission: Secondary | ICD-10-CM

## 2024-07-21 DIAGNOSIS — F902 Attention-deficit hyperactivity disorder, combined type: Secondary | ICD-10-CM

## 2024-07-21 DIAGNOSIS — F4310 Post-traumatic stress disorder, unspecified: Secondary | ICD-10-CM | POA: Diagnosis not present

## 2024-07-21 NOTE — Progress Notes (Unsigned)
                edge,  experiencing concentration difficulties, having trouble falling or staying asleep, exhibiting a general  state of irritability).: No Description Entered (Status: improved). Motor tension (e.g., restlessness,  tiredness, shakiness, muscle tension).: No Description Entered (Status: improved).  Problems Addressed  Anxiety, Phase Of Life Problems, Anxiety  Goals 1. Learn and implement coping skills that result in a reduction of anxiety  and worry, and improved daily functioning. Objective Learn  and implement calming skills to reduce overall anxiety and manage anxiety symptoms. Target Date: 2025-08-09Frequency: Weekly Progress: 40 Modality: individual  Related Interventions 1. Teach the client calming/relaxation skills (e.g., applied relaxation, progressive muscle  relaxation, cue controlled relaxation; mindful breathing; biofeedback) and how to discriminate  better between relaxation and tension; teach the client how to apply these skills to his/her daily  life (e.g., New Directions in Progressive Muscle Relaxation by Marcelyn Ditty, and  Hazlett-Stevens; Treating Generalized Anxiety Disorder by Rygh and Ida Rogue). Objective Identify, challenge, and replace biased, fearful self-talk with positive, realistic, and empowering selftalk. Target Date: 2024-07-10 Frequency: weekly Progress: 30 Modality: individual Related Interventions 1. Explore the client's schema and self-talk that mediate his/her fear response; assist him/her in  challenging the biases; replace the distorted messages with reality-based alternatives and  positive, realistic self-talk that will increase his/her self-confidence in coping with irrational  fears (see Cognitive Therapy of Anxiety Disorders by Laurence Slate). Objective Learn and implement problem-solving strategies for realistically addressing worries. Target Date: 2025-08-09Frequency: weekly Progress: 40 Modality: individual 2. Resolve conflicted feelings and adapt to the new life circumstances. Objective Apply problem-solving skills to current circumstances. Target Date: 2024-07-10 Frequency: weekly Progress: 20 Modality: individual Related Interventions 1. Teach the client problem-resolution skills (e.g., defining the problem clearly, brainstorming  multiple solutions, listing the pros and cons of each solution, seeking input from others,  selecting and implementing a plan of action, evaluating outcome, and readjusting plan as   necessary).   3. Stabilize anxiety level while increasing ability to function on a daily  basis. Diagnosis F33.1  Major depressive disorder, moderate 300.02 (Generalized anxiety disorder) - Open - [Signifier: n/a]  Axis  none 309.28 (Adjustment disorder with mixed anxiety and depressed  mood) - Open - [Signifier: n/a]  Adjustment Disorder,  With Anxiety   Marital conflict  Major Depressive disorder, moderate  Conditions For Discharge Achievement of treatment goals and objectives.  The patient approved this plan.   Deonna Krummel G Ethridge Sollenberger, LCSW

## 2024-07-22 DIAGNOSIS — Z981 Arthrodesis status: Secondary | ICD-10-CM | POA: Diagnosis not present

## 2024-07-22 DIAGNOSIS — M791 Myalgia, unspecified site: Secondary | ICD-10-CM | POA: Diagnosis not present

## 2024-07-22 DIAGNOSIS — G894 Chronic pain syndrome: Secondary | ICD-10-CM | POA: Diagnosis not present

## 2024-07-23 ENCOUNTER — Encounter (INDEPENDENT_AMBULATORY_CARE_PROVIDER_SITE_OTHER): Payer: Self-pay | Admitting: Family Medicine

## 2024-07-23 DIAGNOSIS — G4733 Obstructive sleep apnea (adult) (pediatric): Secondary | ICD-10-CM | POA: Diagnosis not present

## 2024-07-26 ENCOUNTER — Encounter: Payer: Self-pay | Admitting: Adult Health

## 2024-07-26 ENCOUNTER — Telehealth: Payer: Self-pay

## 2024-07-26 ENCOUNTER — Other Ambulatory Visit: Payer: Self-pay

## 2024-07-26 ENCOUNTER — Telehealth (INDEPENDENT_AMBULATORY_CARE_PROVIDER_SITE_OTHER): Admitting: Adult Health

## 2024-07-26 DIAGNOSIS — F902 Attention-deficit hyperactivity disorder, combined type: Secondary | ICD-10-CM | POA: Diagnosis not present

## 2024-07-26 DIAGNOSIS — F3341 Major depressive disorder, recurrent, in partial remission: Secondary | ICD-10-CM | POA: Diagnosis not present

## 2024-07-26 DIAGNOSIS — K5903 Drug induced constipation: Secondary | ICD-10-CM

## 2024-07-26 DIAGNOSIS — G4700 Insomnia, unspecified: Secondary | ICD-10-CM

## 2024-07-26 DIAGNOSIS — F4310 Post-traumatic stress disorder, unspecified: Secondary | ICD-10-CM

## 2024-07-26 DIAGNOSIS — K581 Irritable bowel syndrome with constipation: Secondary | ICD-10-CM

## 2024-07-26 DIAGNOSIS — F411 Generalized anxiety disorder: Secondary | ICD-10-CM | POA: Diagnosis not present

## 2024-07-26 NOTE — Telephone Encounter (Signed)
 Patient reports she has been on Ibsrela  50mg  BID for several months, has increased her water intake to at least 100 oz a day and is doing pelvic floor exercises and she is still having constipation.  Has to use a Fleet enema to have a BM - can go a while week without a BM  Please advise

## 2024-07-26 NOTE — Progress Notes (Signed)
 Cancel.

## 2024-07-26 NOTE — Progress Notes (Signed)
 Paige [name] [id number] August 31, 1966 58 y.o.  Virtual Visit via Video Note  I connected with pt @ on 07/26/24 at  1:30 PM EDT by a video enabled telemedicine application and verified that I am speaking with the correct person using two identifiers.   I discussed the limitations of evaluation and management by telemedicine and the availability of in person appointments. The patient expressed understanding and agreed to proceed.  I discussed the assessment and treatment plan with the patient. The patient was provided an opportunity to ask questions and all were answered. The patient agreed with the plan and demonstrated an understanding of the instructions.   The patient was advised to call back or seek an in-person evaluation if the symptoms worsen or if the condition fails to improve as anticipated.  I provided 25 minutes of non-face-to-face time during this encounter.  The patient was located at home.  The provider was located at [facility].   Angeline LOISE Sayers, NP   Subjective:   Patient ID:  Paige [name] is a 58 y.o. (DOB 12-09-1965) female.  Chief Complaint: No chief complaint on file.   HPI Paige [name] presents for follow-up of GAD, MDD, PTSD and insomnia.  Describes mood today as ok. Pleasant. Reports decreased tearfulness. Mood symptoms - reports some depression, irritability and anxiety - not as much. Reports varying interest and motivation. Reports chronic pain issues. Denies panic attacks. Reports some worry, rumination and over thinking not too much. Reports mood as variable - some ups and downs - moody. Stating I feel like I'm doing good. Reports taking medications as prescribed. Continues to see therapist every 4 weeks - Bambi Cottle.  Energy levels improved - still in a lot of pain. Active, does not have a regular exercise routine - working with P/T.  Enjoys some usual interests and activities. Lives with partner and 4 cats inside  and 6 outside. Mother local and supportive. Attends church. Appetite adequate. Weight loss 20 pounds - 238 pounds- 68.5. Working with [employer] [facility]. Reports sleeping better some nights than others. Reports an average of 6 to 7 hours. Diagnosed with sleep apnea - using BiPAP machine. Denies daytime napping. Reports improved focus and concentration. Completing tasks around the house. Working part time - 6 hours a week. Disabled since 2009. Denies SI or HI.  Denies AH or VH. Denies self harm. Denies substance use.  Previous medication trials: Lexapro, Paxil, Trazadone, Wellbutrin  SR, Geodon, Seroquel, Risperdal, Depakote, Topamax , Lithium, Provigil, Ambien, Cytomel, Clonazepam  and other benzodiazapines, Elavil, Effexor, Remeron, Prozac, Anafranil, Tofranil, Zoloft, Geodon, Buspar, Adderall, Ritalin , Abilify, Rexulti, Lamictal , Latuda   Review of Systems:  Review of Systems  Musculoskeletal:  Negative for gait problem.  Neurological:  Negative for tremors.  Psychiatric/Behavioral:         Please refer to HPI    Medications: I have reviewed the patient's current medications.  Current Outpatient Medications  Medication Sig Dispense Refill   Albuterol-Budesonide (AIRSUPRA ) 90-80 MCG/ACT AERO Inhale 1 each into the lungs 2 (two) times daily.     Albuterol-Budesonide (AIRSUPRA ) 90-80 MCG/ACT AERO Inhale 1 each into the lungs 2 (two) times daily. (Patient not taking: Reported on 07/15/2024) 1 g 11   buPROPion  (WELLBUTRIN  XL) 150 MG 24 hr tablet TAKE 3 TABLETS EVERY MORNING (Patient taking differently: TAKE 2 TABLETS EVERY MORNING) 270 tablet 3   cariprazine  (VRAYLAR ) 3 MG capsule Take 1 capsule (3 mg total) by mouth daily. 90 capsule 0   clonazePAM  (  KLONOPIN ) 0.5 MG tablet Take 1 tablet (0.5 mg total) by mouth 2 (two) times daily. 60 tablet 2   diazepam  (VALIUM ) 5 MG tablet Place 1 tablet vaginally nightly as needed for muscle spasm/ pelvic pain. 30 tablet 0   docusate sodium   (COLACE) 100 MG capsule Take 100 mg by mouth 2 (two) times daily.     DULoxetine  (CYMBALTA ) 60 MG capsule Take 1 capsule (60 mg total) by mouth 2 (two) times daily. 180 capsule 3   Erenumab -aooe (AIMOVIG ) 70 MG/ML SOAJ INJECT 70 MG INTO THE SKIN EVERY 28 DAYS. 1 mL 11   estradiol  (ESTRACE ) 0.1 MG/GM vaginal cream Place 0.5 g vaginally 2 (two) times a week. Place 0.5g nightly for two weeks then twice a week after 42.5 g 11   estradiol  (ESTRACE ) 0.5 MG tablet Take 0.5 mg by mouth daily.     Fe Fum-Fe Poly-Vit C-Lactobac (FUSION) 65-65-25-30 MG CAPS Take by mouth.     fluticasone (FLONASE SENSIMIST) 27.5 MCG/SPRAY nasal spray 2 sprays (1 spray in each nostril) Nasally Once a day during seasons of difficulty for 30 days     ibuprofen (ADVIL) 800 MG tablet Take 800 mg by mouth 3 (three) times daily.     ipratropium (ATROVENT) 0.06 % nasal spray Place 1 spray into both nostrils daily.     levothyroxine  (SYNTHROID , LEVOTHROID) 88 MCG tablet Take 88 mcg by mouth daily before breakfast.     liothyronine (CYTOMEL) 5 MCG tablet Take 5 mcg by mouth daily.     Magnesium Citrate (MAGNESIUM GUMMIES PO) Take 125 mg by mouth 2 (two) times daily.     Multiple Vitamins-Minerals (MULTIVITAMIN GUMMIES ADULT PO) Take 1 each by mouth daily.     nystatin cream (MYCOSTATIN) Apply 1 application  topically daily as needed for dry skin.     Olopatadine HCl 0.2 % SOLN 1 drop into affected eye Ophthalmic Once a day or prn for 30 days     omeprazole  (PRILOSEC) 20 MG capsule Take 20 mg by mouth 2 (two) times daily before a meal.     ondansetron  (ZOFRAN -ODT) 4 MG disintegrating tablet Take 1 tablet (4 mg total) by mouth every 8 (eight) hours as needed for nausea or vomiting. 20 tablet 11   Oxycodone  HCl 10 MG TABS Take 10 mg by mouth every 6 (six) hours as needed.     potassium chloride  (K-DUR,KLOR-CON ) 10 MEQ tablet Take 1 tablet by mouth daily.     progesterone  (PROMETRIUM ) 100 MG capsule Take 100 mg by mouth daily.      promethazine  (PHENERGAN ) 12.5 MG tablet Take 1 tablet (12.5 mg total) by mouth every 8 (eight) hours as needed for nausea or vomiting. 30 tablet 0   Propylene Glycol (SYSTANE COMPLETE) 0.6 % SOLN Apply 2 drops to eye.     rOPINIRole  (REQUIP ) 2 MG tablet Take 2 mg by mouth 2 (two) times daily.     Tenapanor HCl (IBSRELA ) 50 MG TABS Take 50 mg by mouth 2 (two) times daily before a meal. 60 tablet 3   tiZANidine (ZANAFLEX) 4 MG tablet Take 4 mg by mouth 2 (two) times daily.     torsemide (DEMADEX) 20 MG tablet Take 20 mg by mouth 2 (two) times daily.     traZODone  (DESYREL ) 100 MG tablet Take 2 tablets (200 mg total) by mouth at bedtime. 180 tablet 3   Ubrogepant  (UBRELVY ) 100 MG TABS Take 1 tablet (100 mg total) by mouth daily as needed (at onset of headaches  may repeat dose after 2 hours as needed max 200 mg in 24 hours). 16 tablet 11   Vitamin D -Vitamin K (VITAMIN K2-VITAMIN D3 PO) Take 1 each by mouth daily. Vitamin K2 100 mcg/Vitamin D  50 mcg per gummie     No current facility-administered medications for this visit.    Medication Side Effects: None  Allergies:  Allergies  Allergen Reactions   Tape Dermatitis and Itching    BURNING AND BLISTERING - USUALLY DOES OK WITH TEGADERM  Paper tape or Coban OK   Topiramate  Other (See Comments) and Shortness Of Breath    Really wierd feeling  Off balance; cloudy thinking  Really wierd feeling Off balance; cloudy thinking    Off balance; cloudy thinking   Cyclobenzaprine Other (See Comments)    Other reaction(s): Unknown Hallucinations Unknown    Flexeril [Cyclobenzaprine Hcl] Other (See Comments)    hallucinations   Meloxicam Other (See Comments)   Naltrexone Other (See Comments)    SEVERE PAIN ALL OVER   Pork (Porcine) Protein Other (See Comments)    Other reaction(s): Other (See Comments) Migraines Other reaction(s): Other (See Comments) Migraines   Sumatriptan Other (See Comments)    Difficulty breathing Other reaction(s):  Other (see comments) Off balance    Wound Dressing Adhesive Other (See Comments)    Tape - Burns and blisters; caused skin infection   Brexpiprazole Other (See Comments)    Rexulti     Restless arms   Glycopyrrolate  Other (See Comments)    Urinary retention   Imitrex [Sumatriptan Base] Other (See Comments)    Pt unsure of allergic reaction/intolerance   Lamotrigine  Other (See Comments)    Pt unsure of allergy/intolerance   Sulfa Antibiotics Other (See Comments)    Pt states she does not think she is allergic to Sulfa     Past Medical History:  Diagnosis Date   Allergic rhinitis    Allergy    Anemia    Anxiety disorder    Back pain    Bilateral swelling of feet    Bronchitis    Cancer (HCC)    skin   Chronic fatigue    Chronic fatigue    Chronic headaches    Chronic pain    Constipation    Depression    Endometriosis    Family history of colon cancer    mother,uncles,aunts   Fibromyalgia    Fibromyalgia    Gallbladder problem    GERD (gastroesophageal reflux disease)    Hyperlipidemia    Hypothyroidism    IBS (irritable bowel syndrome)    Insomnia    Iron  deficiency    Joint pain    Laryngopharyngeal reflux (LPR)    Obesity    Osteoarthritis    Personal history of colonic polyps 04/2006   polypoid   PONV (postoperative nausea and vomiting)    RLS (restless legs syndrome)    Sleep apnea    no cpap   SOB (shortness of breath)    Swelling    Vitamin B12 deficiency    Vitamin D  deficiency     Family History  Problem Relation Age of Onset   Obesity Mother    Depression Mother    Cancer Mother    Heart disease Mother    Hyperlipidemia Mother    Hypertension Mother    Colon cancer Mother        uncles,aunts   Sleep apnea Father    Cancer Father    Diabetes Father  paternal grandmother,sister   Leukemia Father    Lymphoma Father    Skin cancer Father    Heart disease Father    Colon polyps Sister    Diabetes Sister    Colon cancer  Maternal Aunt    Colon cancer Maternal Uncle    Colon cancer Cousin    Colon polyps Cousin    Stomach cancer Neg Hx    Esophageal cancer Neg Hx    Rectal cancer Neg Hx     Social History   Socioeconomic History   Marital status: Significant Other    Spouse name: Leonce Ee   Number of children: 0   Years of education: Not on file   Highest education level: Not on file  Occupational History   Occupation: RN/disabled  Tobacco Use   Smoking status: Never   Smokeless tobacco: Never  Vaping Use   Vaping status: Never Used  Substance and Sexual Activity   Alcohol  use: Not Currently    Alcohol /week: 0.0 standard drinks of alcohol     Comment: rarely   Drug use: No   Sexual activity: Yes    Birth control/protection: Other-see comments  Other Topics Concern   Not on file  Social History Narrative   Patient is left-handed. She lives alone in a one level home. She has 3 cats. She is unable to exercise. Caffeine one soda / day. On disability now. 2 bachelor degrees   Social Drivers of Health   Financial Resource Strain: Low Risk  (04/06/2024)   Received from [facility]   Overall Financial Resource Strain (CARDIA)    Difficulty of Paying Living Expenses: Not hard at all  Food Insecurity: No Food Insecurity (04/06/2024)   Received from [facility]   Hunger Vital Sign    Within the past 12 months, you worried that your food would run out before you got the money to buy more.: Never true    Within the past 12 months, the food you bought just didn't last and you didn't have money to get more.: Never true  Transportation Needs: No Transportation Needs (04/06/2024)   Received from [facility]    Lack of Transportation (Medical): No    Lack of Transportation (Non-Medical): No  Physical Activity: Not on file  Stress: Not on file  Social Connections: Not on file  Intimate Partner Violence: Not on file    Past Medical History, Surgical history, Social  history, and Family history were reviewed and updated as appropriate.   Please see review of systems for further details on the patient's review from today.   Objective:   Physical Exam:  There were no vitals taken for this visit.  Physical Exam Constitutional:      General: She is not in acute distress. Musculoskeletal:        General: No deformity.  Neurological:     Mental Status: She is alert and oriented to person, place, and time.     Coordination: Coordination normal.  Psychiatric:        Attention and Perception: Attention and perception normal. She does not perceive auditory or visual hallucinations.        Mood and Affect: Mood normal. Mood is not anxious or depressed. Affect is not labile, blunt, angry or inappropriate.        Speech: Speech normal.        Behavior: Behavior normal.        Thought Content: Thought content normal. Thought content is  not paranoid or delusional. Thought content does not include homicidal or suicidal ideation. Thought content does not include homicidal or suicidal plan.        Cognition and Memory: Cognition and memory normal.        Judgment: Judgment normal.     Comments: Insight intact     Lab Review:     Component Value Date/Time   NA 141 05/25/2024 1214   K 4.2 05/25/2024 1214   CL 99 05/25/2024 1214   CO2 27 05/25/2024 1214   GLUCOSE 95 05/25/2024 1214   GLUCOSE 97 05/26/2019 1046   BUN 16 05/25/2024 1214   CREATININE 0.86 05/25/2024 1214   CALCIUM 9.2 05/25/2024 1214   PROT 6.3 05/25/2024 1214   ALBUMIN 4.2 05/25/2024 1214   AST 32 05/25/2024 1214   ALT 38 (H) 05/25/2024 1214   ALKPHOS 103 05/25/2024 1214   BILITOT 0.3 05/25/2024 1214       Component Value Date/Time   WBC 7.0 05/25/2024 1214   WBC 6.9 05/26/2019 1046   RBC 4.39 05/25/2024 1214   RBC 4.42 05/26/2019 1046   HGB 13.4 05/25/2024 1214   HCT 42.3 05/25/2024 1214   PLT 213 05/25/2024 1214   MCV 96 05/25/2024 1214   MCH 30.5 05/25/2024 1214   MCHC  31.7 05/25/2024 1214   MCHC 32.5 05/26/2019 1046   RDW 12.9 05/25/2024 1214   LYMPHSABS 2.2 05/25/2024 1214   MONOABS 0.6 05/26/2019 1046   EOSABS 0.1 05/25/2024 1214   BASOSABS 0.1 05/25/2024 1214    No results found for: POCLITH, LITHIUM   No results found for: PHENYTOIN, PHENOBARB, VALPROATE, CBMZ   .res Assessment: Plan:    Plan:  1. Wellbutrin  XL 300mg  daily 2. Cymbalta  60mg  BID 3. Trazadone 100mg  - 2 at hs  4. Vraylar  3mg  daily though patient assistance.  5. Clonazepam  0.5mg  BID - taking at bedtime 6. Ritalin  10mg  BID   Continue therapy with Bambi Cottle.   RTC 6 weeks  25 minutes spent dedicated to the care of this patient on the date of this encounter to include pre-visit review of records, ordering of medication, post visit documentation, and face-to-face time with the patient discussing GAD, MDD, PTSD and insomnia. Discussed continuing current medication regimen.  Patient advised to contact office with any questions, adverse effects, or acute worsening in signs and symptoms.  Discussed potential metabolic side effects associated with atypical antipsychotics, as well as potential risk for movement side effects. Advised pt to contact office if movement side effects occur.   Discussed potential benefits, risk, and side effects of benzodiazepines to include potential risk of tolerance and dependence, as well as possible drowsiness. Advised patient not to drive if experiencing drowsiness and to take lowest possible effective dose to minimize risk of dependence and tolerance.     Diagnoses and all orders for this visit:  Recurrent major depressive disorder, in partial remission (HCC)  Generalized anxiety disorder  Attention deficit hyperactivity disorder (ADHD), combined type  PTSD (post-traumatic stress disorder)  Insomnia, unspecified type     Please see After Visit Summary for patient specific instructions.  Future Appointments  Date Time  Provider Department Center  08/11/2024  2:40 PM Midge Sober, DO [facility] None  08/18/2024  2:00 PM Cottle, Bambi G, LCSW [facility] None  08/19/2024  1:00 PM Theodoro Lakes, MD [facility] None  08/26/2024  2:00 PM Elnor Channing FALCON, PT [facility]  08/27/2024 11:00 AM [facility] None  09/02/2024  2:00 PM Elnor,  Channing FALCON, PT [facility]  09/08/2024  2:20 PM Opalski, Barnie, DO [facility] None  09/09/2024  2:00 PM Elnor Channing FALCON, PT [facility]  09/13/2024  1:50 PM Skeet Juliene SAUNDERS, DO [facility] None  09/15/2024  2:00 PM Cottle, Bambi G, LCSW [facility] None  09/16/2024  2:00 PM Elnor Channing FALCON, [place] [facility]  11/12/2024 10:00 AM Marilynne Rosaline SAILOR, MD [facility]    No orders of the defined types were placed in this encounter.     -------------------------------

## 2024-07-28 DIAGNOSIS — Z23 Encounter for immunization: Secondary | ICD-10-CM | POA: Diagnosis not present

## 2024-07-28 MED ORDER — PRUCALOPRIDE SUCCINATE 2 MG PO TABS: 2.0000 mg | ORAL_TABLET | Freq: Every day | ORAL | 3 refills | Status: DC

## 2024-07-28 NOTE — Telephone Encounter (Signed)
 Has failed linzess, dulcolax, enemas, mag citrate, amitiza , could not get movantik due to cost.  Now has failed Isbrela 50 mg twice daily.  Can potentially try to see if Motegrity  is covered by her insurance this helps with movement and can potentially help upper and lower GI symptoms. I would still suggest with anything that we give her that she gets with Benefiber or Citrucel to help give bulk to the stool keep it within her rectum and prevent leakage. Motegrity  is not covered I would suggest doing Amitiza  24 mcg twice daily with MiraLAX and fiber.  At next office visit we will plan on repeat colonoscopy and see how the patient is doing.

## 2024-07-28 NOTE — Telephone Encounter (Signed)
 Called and spoke with patient. Patient states that she does not recall any of the medications being successful for her. Patient states that Miralax makes her leak. Patient does not take fiber consistently, because she states that she tries to eat a good diet. Patient uses an enema about once a week and will continue this as needed. Patient has been scheduled for a follow up with you on 08/10/24 at 8:40 am. What would you like patient to try in the interim?

## 2024-07-28 NOTE — Addendum Note (Signed)
 Addended by: CRAIG PALMA on: 07/28/2024 12:31 PM   Modules accepted: Orders

## 2024-07-29 ENCOUNTER — Other Ambulatory Visit (HOSPITAL_COMMUNITY): Payer: Self-pay

## 2024-07-29 ENCOUNTER — Ambulatory Visit (INDEPENDENT_AMBULATORY_CARE_PROVIDER_SITE_OTHER): Admitting: Family Medicine

## 2024-07-29 ENCOUNTER — Telehealth: Payer: Self-pay

## 2024-07-29 NOTE — Telephone Encounter (Signed)
 Pharmacy Patient Advocate Encounter   Received notification from CoverMyMeds that prior authorization for Prucalopride Succinate  2MG  tablets is required/requested.   Insurance verification completed.   The patient is insured through [facility] MPD.   Per test claim: PA required; PA submitted to above mentioned insurance via Latent Key/confirmation #/EOC [facility] Status is pending

## 2024-07-30 ENCOUNTER — Other Ambulatory Visit (HOSPITAL_COMMUNITY): Payer: Self-pay

## 2024-07-30 NOTE — Telephone Encounter (Signed)
 Pharmacy Patient Advocate Encounter  Received notification from [facility] MPD that Prior Authorization for Prucalopride Succinate  2MG  tablets has been DENIED.  Full denial letter will be uploaded to the media tab. See denial reason below.  We denied coverage for this drug because Prucalopride is not being prescribed for an FDA labeled or medically accepted use. A medically accepted use is approved by the FDA or supported by the [facility] Formulary Service Drug Information and the DRUGDEX Information System.  In this case, Prucalopride is not being prescribed in accordance with an FDA labeled use or use accepted by the Medicare approved drug compendia.   PA #/Case ID/Reference #: [id number]    Alternatives medications:   Amitiza  24mcg #60 per 30 - $37.59 Trulance  - Non formulary Linzess - Non formulary Movantik 25mg  #30 per 30 - $45.00 Ibsrela  - Plan/Benefit Exclusion

## 2024-07-30 NOTE — Telephone Encounter (Signed)
 See phone note 07/29/24

## 2024-08-03 ENCOUNTER — Other Ambulatory Visit (HOSPITAL_COMMUNITY): Payer: Self-pay

## 2024-08-03 NOTE — Telephone Encounter (Signed)
 Lets try movantik 25 mg once daily #30, I was looking through my notes and it appears she could not get due to cost.

## 2024-08-03 NOTE — Telephone Encounter (Signed)
 Co-pay card added for patient

## 2024-08-03 NOTE — Telephone Encounter (Signed)
 Co-pay card available for patient.

## 2024-08-04 ENCOUNTER — Other Ambulatory Visit (HOSPITAL_COMMUNITY): Payer: Self-pay

## 2024-08-04 DIAGNOSIS — G894 Chronic pain syndrome: Secondary | ICD-10-CM | POA: Diagnosis not present

## 2024-08-04 DIAGNOSIS — M7061 Trochanteric bursitis, right hip: Secondary | ICD-10-CM | POA: Diagnosis not present

## 2024-08-04 DIAGNOSIS — M25551 Pain in right hip: Secondary | ICD-10-CM | POA: Diagnosis not present

## 2024-08-04 DIAGNOSIS — M5414 Radiculopathy, thoracic region: Secondary | ICD-10-CM | POA: Diagnosis not present

## 2024-08-04 NOTE — Telephone Encounter (Signed)
 Card is from [employer]. Below are results of test claim with insurance and card applied. The co-pay drops from $45.00 to $0.00

## 2024-08-05 ENCOUNTER — Other Ambulatory Visit: Payer: Self-pay | Admitting: Physician Assistant

## 2024-08-05 ENCOUNTER — Encounter: Payer: Self-pay | Admitting: Physician Assistant

## 2024-08-05 DIAGNOSIS — M4326 Fusion of spine, lumbar region: Secondary | ICD-10-CM

## 2024-08-05 DIAGNOSIS — M5416 Radiculopathy, lumbar region: Secondary | ICD-10-CM

## 2024-08-05 DIAGNOSIS — M5414 Radiculopathy, thoracic region: Secondary | ICD-10-CM

## 2024-08-05 DIAGNOSIS — M47814 Spondylosis without myelopathy or radiculopathy, thoracic region: Secondary | ICD-10-CM

## 2024-08-05 DIAGNOSIS — M25551 Pain in right hip: Secondary | ICD-10-CM | POA: Diagnosis not present

## 2024-08-05 DIAGNOSIS — M79651 Pain in right thigh: Secondary | ICD-10-CM | POA: Diagnosis not present

## 2024-08-05 DIAGNOSIS — M545 Low back pain, unspecified: Secondary | ICD-10-CM | POA: Diagnosis not present

## 2024-08-05 NOTE — Telephone Encounter (Signed)
 See phone note 8/28

## 2024-08-09 NOTE — Progress Notes (Deleted)
 08/09/2024 Paige [name] Endo [id number] 23-Jan-1966  Referring provider: Jefferey Fitch, MD Primary GI doctor: Dr. Federico  ( Dr. 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 03/22/24 EGD for dysphagia unremarkable esophagus status post empiric dilation, gastric polyp, healthy-appearing anastomosis normal jejunum.  Path showed reflux esophagitis negative EOE, negative H. pylori, benign polyp On omeprazole  BID, no reflux Was taking ibuprofen until a few weeks ago  Constipation/IBS with fecal incontinence rare BM once a week, increasing flatuance Occ fecal leakage and bowel incontinence She is on oxycodone  for back pain, 10 6 pills a day, spinal cord stimulator possible component of pelvic floor dysfunction with history and symptoms, follow up with urogyn June 16, consider pelvic floor PT/manometry Has failed linzess, dulcolax, enemas, mag citrate, amitiza  Failed Isbrela, could not get Motegrity , tried Movantik  Due for colon 10/2024  Screening colonoscopy 10/04/2021 with Dr. Aneita for personal history of adenomatous polyps family history of colon cancer showed 4 polyps ranging 7 to 10 mm sigmoid colon transverse descending, small lipoma transverse recall 3 years  Due 10/2024   Status post Roux-en-Y Follow up PCP  [facility] On Bipap since may 2024  Patient Care Team: Jefferey Fitch, MD as PCP - General (Internal Medicine) Skeet Juliene SAUNDERS, DO as Consulting Physician (Neurology)  HISTORY OF PRESENT ILLNESS: 58 y.o. female with a past medical history of Roux-en-Y, constipation/IBS, IDA, GERD and others listed below presents for evaluation of  Constipation and dysphagia.   Patient was last seen in the office 02/16/2024 for dysphagia and constipation.  Discussed the use of AI scribe software for clinical note transcription with the patient, who gave verbal consent to proceed.  History of Present Illness            She  reports that she has never smoked. She has never used smokeless tobacco. She reports that she does not currently use alcohol . She reports that she does not use drugs.  RELEVANT GI HISTORY, IMAGING AND LABS:  CBC    Component Value Date/Time   WBC 7.0 05/25/2024 1214   WBC 6.9 05/26/2019 1046   RBC 4.39 05/25/2024 1214   RBC 4.42 05/26/2019 1046   HGB 13.4 05/25/2024 1214   HCT 42.3 05/25/2024 1214   PLT 213 05/25/2024 1214   MCV 96 05/25/2024 1214   MCH 30.5 05/25/2024 1214   MCHC 31.7 05/25/2024 1214   MCHC 32.5 05/26/2019 1046   RDW 12.9 05/25/2024 1214   LYMPHSABS 2.2 05/25/2024 1214   MONOABS 0.6 05/26/2019 1046   EOSABS 0.1 05/25/2024 1214   BASOSABS 0.1 05/25/2024 1214   Recent Labs    05/25/24 1214  HGB 13.4    CMP     Component Value Date/Time   NA 141 05/25/2024 1214   K 4.2 05/25/2024 1214   CL 99 05/25/2024 1214   CO2 27 05/25/2024 1214   GLUCOSE 95 05/25/2024 1214   GLUCOSE 97 05/26/2019 1046   BUN 16 05/25/2024 1214   CREATININE 0.86 05/25/2024 1214   CALCIUM 9.2 05/25/2024 1214   PROT 6.3 05/25/2024 1214   ALBUMIN 4.2 05/25/2024 1214   AST 32 05/25/2024 1214   ALT 38 (H) 05/25/2024 1214   ALKPHOS 103 05/25/2024 1214   BILITOT  0.3 05/25/2024 1214      Latest Ref Rng & Units 05/25/2024   12:14 PM 05/26/2019   10:46 AM 05/12/2017   12:22 PM  Hepatic Function  Total Protein 6.0 - 8.5 g/dL 6.3  6.9  6.5   Albumin 3.8 - 4.9 g/dL 4.2  4.2  4.2   AST 0 - 40 IU/L 32  36  34   ALT 0 - 32 IU/L 38  30  49   Alk Phosphatase 44 - 121 IU/L 103  98  63   Total Bilirubin 0.0 - 1.2 mg/dL 0.3  0.4  0.3   Bilirubin, Direct 0.0 - 0.3 mg/dL   0.1       Current Medications:    Current Outpatient Medications (Endocrine & Metabolic):    estradiol  (ESTRACE ) 0.5 MG tablet, Take 0.5 mg by mouth daily.   levothyroxine  (SYNTHROID , LEVOTHROID) 88 MCG tablet, Take 88 mcg by mouth daily before breakfast.   liothyronine (CYTOMEL) 5 MCG tablet, Take 5 mcg by mouth daily.   progesterone  (PROMETRIUM ) 100 MG capsule, Take 100 mg by mouth daily.  Current Outpatient Medications (Cardiovascular):    torsemide (DEMADEX) 20 MG tablet, Take 20 mg by mouth 2 (two) times daily.  Current Outpatient Medications (Respiratory):    Albuterol-Budesonide (AIRSUPRA ) 90-80 MCG/ACT AERO, Inhale 1 each into the lungs 2 (two) times daily.   Albuterol-Budesonide (AIRSUPRA ) 90-80 MCG/ACT AERO, Inhale 1 each into the lungs 2 (two) times daily. (Patient not taking: Reported on 07/15/2024)   fluticasone (FLONASE SENSIMIST) 27.5 MCG/SPRAY nasal spray, 2 sprays (1 spray in each nostril) Nasally Once a day during seasons of difficulty for 30 days   ipratropium (ATROVENT) 0.06 % nasal spray, Place 1 spray into both nostrils daily.   promethazine  (PHENERGAN ) 12.5 MG tablet, Take 1 tablet (12.5 mg total) by mouth every 8 (eight) hours as needed for nausea or vomiting.  Current Outpatient Medications (Analgesics):    Erenumab -aooe (AIMOVIG ) 70 MG/ML SOAJ, INJECT 70 MG INTO THE SKIN EVERY 28 DAYS.   ibuprofen (ADVIL) 800 MG tablet, Take 800 mg by mouth 3 (three) times daily.   Oxycodone  HCl 10 MG TABS, Take 10 mg by mouth every 6 (six) hours as needed.   Ubrogepant  (UBRELVY ) 100 MG TABS, Take 1 tablet (100 mg total) by mouth daily as needed (at onset of headaches may repeat dose after 2 hours as needed max 200 mg in 24 hours).  Current Outpatient Medications (Hematological):    Fe Fum-Fe Poly-Vit C-Lactobac (FUSION) 65-65-25-30 MG CAPS, Take by mouth.  Current Outpatient Medications (Other):    buPROPion  (WELLBUTRIN  XL) 150 MG 24 hr tablet, TAKE 3 TABLETS EVERY MORNING (Patient taking differently: TAKE 2  TABLETS EVERY MORNING)   cariprazine  (VRAYLAR ) 3 MG capsule, Take 1 capsule (3 mg total) by mouth daily.   clonazePAM  (KLONOPIN ) 0.5 MG tablet, Take 1 tablet (0.5 mg total) by mouth 2 (two) times daily.   diazepam  (VALIUM ) 5 MG tablet, Place 1 tablet vaginally nightly as needed for muscle spasm/ pelvic pain.   docusate sodium  (COLACE) 100 MG capsule, Take 100 mg by mouth 2 (two) times daily.   DULoxetine  (CYMBALTA ) 60 MG capsule, Take 1 capsule (60 mg total) by mouth 2 (two) times daily.   estradiol  (ESTRACE ) 0.1 MG/GM vaginal cream, Place 0.5 g vaginally 2 (two) times a week. Place 0.5g nightly for two weeks then twice a week after   Magnesium Citrate (MAGNESIUM GUMMIES PO), Take 125 mg by mouth 2 (two) times daily.  Multiple Vitamins-Minerals (MULTIVITAMIN GUMMIES ADULT PO), Take 1 each by mouth daily.   nystatin cream (MYCOSTATIN), Apply 1 application  topically daily as needed for dry skin.   Olopatadine HCl 0.2 % SOLN, 1 drop into affected eye Ophthalmic Once a day or prn for 30 days   omeprazole  (PRILOSEC) 20 MG capsule, Take 20 mg by mouth 2 (two) times daily before a meal.   ondansetron  (ZOFRAN -ODT) 4 MG disintegrating tablet, Take 1 tablet (4 mg total) by mouth every 8 (eight) hours as needed for nausea or vomiting.   potassium chloride  (K-DUR,KLOR-CON ) 10 MEQ tablet, Take 1 tablet by mouth daily.   Propylene Glycol (SYSTANE COMPLETE) 0.6 % SOLN, Apply 2 drops to eye.   Prucalopride Succinate  (MOTEGRITY ) 2 MG TABS, Take 1 tablet (2 mg total) by mouth daily.   rOPINIRole  (REQUIP ) 2 MG tablet, Take 2 mg by mouth 2 (two) times daily.   Tenapanor HCl (IBSRELA ) 50 MG TABS, Take 50 mg by mouth 2 (two) times daily before a meal.   tiZANidine (ZANAFLEX) 4 MG tablet, Take 4 mg by mouth 2 (two) times daily.   traZODone  (DESYREL ) 100 MG tablet, Take 2 tablets (200 mg total) by mouth at bedtime.   Vitamin D -Vitamin K (VITAMIN K2-VITAMIN D3 PO), Take 1 each by mouth daily. Vitamin K2 100 mcg/Vitamin  D 50 mcg per gummie  Medical History:  Past Medical History:  Diagnosis Date   Allergic rhinitis    Allergy    Anemia    Anxiety disorder    Back pain    Bilateral swelling of feet    Bronchitis    Cancer (HCC)    skin   Chronic fatigue    Chronic fatigue    Chronic headaches    Chronic pain    Constipation    Depression    Endometriosis    Family history of colon cancer    mother,uncles,aunts   Fibromyalgia    Fibromyalgia    Gallbladder problem    GERD (gastroesophageal reflux disease)    Hyperlipidemia    Hypothyroidism    IBS (irritable bowel syndrome)    Insomnia    Iron  deficiency    Joint pain    Laryngopharyngeal reflux (LPR)    Obesity    Osteoarthritis    Personal history of colonic polyps 04/2006   polypoid   PONV (postoperative nausea and vomiting)    RLS (restless legs syndrome)    Sleep apnea    no cpap   SOB (shortness of breath)    Swelling    Vitamin B12 deficiency    Vitamin D  deficiency    Allergies:  Allergies  Allergen Reactions   Tape Dermatitis and Itching    BURNING AND BLISTERING - USUALLY DOES OK WITH TEGADERM  Paper tape or Coban OK   Topiramate  Other (See Comments) and Shortness Of Breath    Really wierd feeling  Off balance; cloudy thinking  Really wierd feeling Off balance; cloudy thinking    Off balance; cloudy thinking   Cyclobenzaprine Other (See Comments)    Other reaction(s): Unknown Hallucinations Unknown    Flexeril [Cyclobenzaprine Hcl] Other (See Comments)    hallucinations   Meloxicam Other (See Comments)   Naltrexone Other (See Comments)    SEVERE PAIN ALL OVER   Pork (Porcine) Protein Other (See Comments)    Other reaction(s): Other (See Comments) Migraines Other reaction(s): Other (See Comments) Migraines   Sumatriptan Other (See Comments)    Difficulty breathing Other reaction(s): Other (  see comments) Off balance    Wound Dressing Adhesive Other (See Comments)    Tape - Burns and blisters;  caused skin infection   Brexpiprazole Other (See Comments)    Rexulti     Restless arms   Glycopyrrolate  Other (See Comments)    Urinary retention   Imitrex [Sumatriptan Base] Other (See Comments)    Pt unsure of allergic reaction/intolerance   Lamotrigine  Other (See Comments)    Pt unsure of allergy/intolerance   Sulfa Antibiotics Other (See Comments)    Pt states she does not think she is allergic to Sulfa      Surgical History:  She  has a past surgical history that includes Roux-en-Y Gastric Bypass (04/2004); Appendectomy (1993); Cholecystectomy (1999); Repair ankle ligament (Left, 2001); Anterior cruciate ligament repair (Left, 2003); Colonoscopy (2009); Carpal tunnel release (Left, 09/25/2017); Tonsillectomy and adenoidectomy (1976); Carpal tunnel release (Right); Polypectomy; Back surgery (03/2024); Lumbar laminectomy; Panniculectomy; Trigger finger release; and De Quervain's release. Family History:  Her family history includes Cancer in her father and mother; Colon cancer in her cousin, maternal aunt, maternal uncle, and mother; Colon polyps in her cousin and sister; Depression in her mother; Diabetes in her father and sister; Heart disease in her father and mother; Hyperlipidemia in her mother; Hypertension in her mother; Leukemia in her father; Lymphoma in her father; Obesity in her mother; Skin cancer in her father; Sleep apnea in her father.  REVIEW OF SYSTEMS  : All other systems reviewed and negative except where noted in the History of Present Illness.  PHYSICAL EXAM: There were no vitals taken for this visit. Physical Exam   GENERAL APPEARANCE: Obese, in no apparent distress. HEENT: No cervical lymphadenopathy, unremarkable thyroid , sclerae anicteric, conjunctiva pink. RESPIRATORY: Respiratory effort normal, breath sounds equal bilaterally without rales, rhonchi, or wheezing. CARDIO: Regular rate and rhythm with no murmurs, rubs, or gallops, peripheral pulses  intact. ABDOMEN: Soft, non-distended, active bowel sounds in all four quadrants, mild tenderness over entire abdomen with increased tenderness in the epigastric region, no rebound, no mass appreciated. RECTAL: Declines. MUSCULOSKELETAL: Full range of motion, normal gait, without edema. SKIN: Dry, intact without rashes or lesions. No jaundice. NEURO: Alert, oriented, no focal deficits. PSYCH: Cooperative, normal mood and affect.      Alan JONELLE Coombs, PA-C 8:39 AM

## 2024-08-10 ENCOUNTER — Ambulatory Visit: Admitting: Physician Assistant

## 2024-08-11 ENCOUNTER — Ambulatory Visit (INDEPENDENT_AMBULATORY_CARE_PROVIDER_SITE_OTHER): Admitting: Family Medicine

## 2024-08-11 ENCOUNTER — Encounter (INDEPENDENT_AMBULATORY_CARE_PROVIDER_SITE_OTHER): Payer: Self-pay | Admitting: Family Medicine

## 2024-08-11 VITALS — BP 109/76 | HR 82 | Temp 97.6°F | Ht 67.0 in | Wt 228.0 lb

## 2024-08-11 DIAGNOSIS — Z6835 Body mass index (BMI) 35.0-35.9, adult: Secondary | ICD-10-CM

## 2024-08-11 DIAGNOSIS — Z6838 Body mass index (BMI) 38.0-38.9, adult: Secondary | ICD-10-CM

## 2024-08-11 DIAGNOSIS — M545 Low back pain, unspecified: Secondary | ICD-10-CM | POA: Diagnosis not present

## 2024-08-11 DIAGNOSIS — E88819 Insulin resistance, unspecified: Secondary | ICD-10-CM | POA: Diagnosis not present

## 2024-08-11 MED ORDER — METFORMIN HCL 500 MG PO TABS: ORAL_TABLET | ORAL | 0 refills | Status: DC

## 2024-08-11 NOTE — Discharge Instructions (Signed)

## 2024-08-11 NOTE — Progress Notes (Signed)
 Paige [name], D.O.  ABFM, AB[facility] Specializing in Clinical Bariatric Medicine  Office located at: [address] [place], [place]  [address]   Assessment and Plan:   Meds ordered this encounter  Medications   metFORMIN  (GLUCOPHAGE ) 500 MG tablet    Sig: 1 po with lunch daily    Dispense:  30 tablet    Refill:  0    30 d supply;  ** OV for RF **   Do not send RF request   FOR THE DISEASE OF OBESITY:  Morbid obesity (HCC) Starting BMI 40.57; BMI 38.0-38.9,adult current 35.7 Assessment & Plan: Since last office visit on 07/15/2024 patient's muscle mass has decreased by 0.4 lbs. Fat mass has decreased by 10.2 lbs. Total body water has decreased by 12.4 lbs.  Body fat % has decreased by 2.2 %. Counseling done on how various foods will affect these numbers and how to maximize success  Total lbs lost to date: 31 lbs Total weight loss percentage to date: 11.97 %  - Tracking Calories/Macros: yes, sticking to parameters majority of the time. She achieved her protein goals daily with peak caloric intakes of 1062 at lowest & 5097 at highest.  - Eating More Whole Foods: yes  - Adequate Protein Intake: yes  - Adequate Water Intake: yes, ~ 1 gallon daily  - Skipping Meals: no  - Sleeping 7-9 Hours/ Night: no  Recommended Dietary Goals [name] is currently in the action stage of change. As such, her goal is to continue weight management plan.  She has agreed to: continue current plan  Behavioral Intervention We discussed the following today: increasing lean protein intake to established goals, increasing fiber rich foods, avoiding skipping meals, and practice mindfulness eating and understand the difference between hunger signals and cravings  Additional resources provided today: Handout on recognition of cravings vs hunger; and emotional versus physical hunger  Evidence-based interventions for health behavior change were utilized today including the discussion of self  monitoring techniques, problem-solving barriers and SMART goal setting techniques.   Regarding patient's less desirable eating habits and patterns, we employed the technique of small changes.   Goal(s) for next OV: eating frequent, smaller meals to reduce hunger and craving   Recommended Physical Activity Goals Paige [name] has been advised to work up to 300-450 minutes of moderate intensity aerobic activity a week and strengthening exercises 2-3 times per week for cardiovascular health, weight loss maintenance and preservation of muscle mass.   She has agreed to: Unable to participate in physical activity at present due to medical conditions    Pharmacotherapy We both agreed to: Start anti-obesity medication - Metformin  500 mg, uptitrating from 0.5 tablet to 1 tablet as tolerated and taking with a meal to avoid GI upset.    ASSOCIATED CONDITIONS ADDRESSED TODAY:  Insulin  resistance Goal is HgbA1c < 5.7, fasting insulin  closer to 5. Both HgbA1c and fasting insulin  are within normal limits Medication: none. She does admittedly experience occasional hypoglycemic events. Also is experiencing hunger shortly after meals with generalized cravings despite no changes in the foods she is eating.  Discussed behavior modification techniques were to help deal with emotional/non-hunger eating behaviors. Discussed things that may trigger hunger signals (I.e increased carb consumption or stress) and the importance of decreasing simple carbs, increasing proteins and how certain foods they eat will affect their blood sugars. To avoid hunger/craving, I've recommended frequent, smaller meals throughout the day (q2-3 hours), which should also help prevent Hypoglycemia  Discussed Metformin , explaining  benefits/risks of medication. Mutually decided to start Metformin , uptitrating from 0.5 to 1 whole tablet, taking it with a meal to avoid GI upset.   Lab Results  Component Value Date   HGBA1C 5.5 05/25/2024    INSULIN  5.4 05/25/2024     Acute right-sided low back pain, unspecified whether sciatica present Right lower back pain onset acutely 11 days ago, which has significantly impacted her ability to exercise. To manage her pain she has been using Advil 800 mg BID.  Reviewed with patient most recent kidney functions in June, which were within normal limits, and counseled her on the affect of NSAIDs to kidney function. Advised that she avoid NSAIDS whenever possible in the future and to discuss with her Orthopedist regarding alternative pain management. Lab Results  Component Value Date   NA 141 05/25/2024   CL 99 05/25/2024   K 4.2 05/25/2024   CO2 27 05/25/2024   BUN 16 05/25/2024   CREATININE 0.86 05/25/2024   EGFR 78 05/25/2024   CALCIUM 9.2 05/25/2024   ALBUMIN 4.2 05/25/2024   GLUCOSE 95 05/25/2024   Follow up:   Return in 4 weeks (on 09/08/2024). She was informed of the importance of frequent follow up visits to maximize her success with intensive lifestyle modifications for her multiple health conditions.  Subjective:    Chief complaint: Obesity Conner is here to discuss her progress with her obesity treatment plan. She is on the Category 2 Plan w/ goal of 1200-1300 calories, 85 g +++ protein and states she is following her eating plan approximately 100% of the time. Pt is not exercising due to right lower back pain x11 days. Previously was exercising 1 hour daily.   Interval History:  Paige [name] is here for a follow up office visit. Pt has experienced a weight loss of 11 LBS since last OV on 07/15/2024.  In the interim, she was seen in the ED on 9/04 for radicular back pain, which has significantly affected her capability to exercise and emotional state. She does admit to experiencing hunger 30-45 minutes after a meal with generalized cravings and occasionally snacking between meals.    Pharmacotherapy that aid with weight loss: She is currently taking Bupropion  150 mg  daily and expresses interest in starting a medication to aid with weight loss citing difficulty with feeling hunger outside of meal times and generalized cravings.    Review of Systems:  Pertinent positives were addressed with patient today.  Reviewed by clinician on day of visit: allergies, medications, problem list, medical history, surgical history, family history, social history, and previous encounter notes.  Weight Summary and Biometrics   Weight Lost Since Last Visit: 11lb  Weight Gained Since Last Visit: 0    Vitals Temp: 97.6 F (36.4 C) BP: 109/76 Pulse Rate: 82 SpO2: 94 %   Anthropometric Measurements Height: 5' 7 (1.702 m) Weight: 228 lb (103.4 kg) BMI (Calculated): 35.7 Weight at Last Visit: 239lb Weight Lost Since Last Visit: 11lb Weight Gained Since Last Visit: 0 Starting Weight: 259lb Total Weight Loss (lbs): 31 lb (14.1 kg) Peak Weight: 316lb   Body Composition  Body Fat %: 47.6 % Fat Mass (lbs): 109 lbs Muscle Mass (lbs): 113.8 lbs Total Body Water (lbs): 82.6 lbs Visceral Fat Rating : 14   Other Clinical Data Fasting: no Labs: no Today's Visit #: 5 Starting Date: 05/25/24    Objective:   PHYSICAL EXAM: Blood pressure 109/76, pulse 82, temperature 97.6 F (36.4 C), height 5' 7 (1.702  m), weight 228 lb (103.4 kg), SpO2 94%. Body mass index is 35.71 kg/m.  General: she is overweight, cooperative and in no acute distress. PSYCH: Has normal mood, affect and thought process.   HEENT: EOMI, sclerae are anicteric. Lungs: Normal breathing effort, no conversational dyspnea. Extremities: Moves * 4 Neurologic: A and O * 3, good insight  DIAGNOSTIC DATA REVIEWED: BMET    Component Value Date/Time   NA 141 05/25/2024 1214   K 4.2 05/25/2024 1214   CL 99 05/25/2024 1214   CO2 27 05/25/2024 1214   GLUCOSE 95 05/25/2024 1214   GLUCOSE 97 05/26/2019 1046   BUN 16 05/25/2024 1214   CREATININE 0.86 05/25/2024 1214   CALCIUM 9.2 05/25/2024  1214   Lab Results  Component Value Date   HGBA1C 5.5 05/25/2024   Lab Results  Component Value Date   INSULIN  5.4 05/25/2024   Lab Results  Component Value Date   TSH 1.430 05/25/2024   CBC    Component Value Date/Time   WBC 7.0 05/25/2024 1214   WBC 6.9 05/26/2019 1046   RBC 4.39 05/25/2024 1214   RBC 4.42 05/26/2019 1046   HGB 13.4 05/25/2024 1214   HCT 42.3 05/25/2024 1214   PLT 213 05/25/2024 1214   MCV 96 05/25/2024 1214   MCH 30.5 05/25/2024 1214   MCHC 31.7 05/25/2024 1214   MCHC 32.5 05/26/2019 1046   RDW 12.9 05/25/2024 1214   Iron  Studies    Component Value Date/Time   IRON  94 05/25/2024 1214   TIBC 334 05/25/2024 1214   FERRITIN 49 05/25/2024 1214   IRONPCTSAT 28 05/25/2024 1214   Lipid Panel     Component Value Date/Time   CHOL 170 05/25/2024 1214   TRIG 61 05/25/2024 1214   HDL 64 05/25/2024 1214   CHOLHDL 2.7 05/25/2024 1214   LDLCALC 94 05/25/2024 1214   Hepatic Function Panel     Component Value Date/Time   PROT 6.3 05/25/2024 1214   ALBUMIN 4.2 05/25/2024 1214   AST 32 05/25/2024 1214   ALT 38 (H) 05/25/2024 1214   ALKPHOS 103 05/25/2024 1214   BILITOT 0.3 05/25/2024 1214   BILIDIR 0.1 05/12/2017 1222      Component Value Date/Time   TSH 1.430 05/25/2024 1214   Nutritional Lab Results  Component Value Date   VD25OH 81.5 05/25/2024    Attestations:   IDamien Blanks, acting as a Stage manager for Paige Jenkins, DO., have compiled all relevant documentation for today's office visit on behalf of Paige Jenkins, DO, while in the presence of [employer], DO.  I have spent 30 minutes in the care of the patient today including 25 minutes face-to-face assessing and reviewing listed medical problems above as outlined in office visit note and providing nutritional and behavioral counseling as outlined in obesity care plan.   I have reviewed the above documentation for accuracy and completeness, and I agree with the above.  Paige JINNY [name], D.O.  The 21st Century Cures Act was signed into law in 2016 which includes the topic of electronic health records.  This provides immediate access to information in MyChart.  This includes consultation notes, operative notes, office notes, lab results and pathology reports.  If you have any questions about what you read please let us  know at your next visit so we can discuss your concerns and take corrective action if need be.  We are right here with you.

## 2024-08-12 ENCOUNTER — Ambulatory Visit
Admission: RE | Admit: 2024-08-12 | Discharge: 2024-08-12 | Disposition: A | Discharge status: Home / Self Care | Source: Ambulatory Visit | Attending: Physician Assistant | Admitting: Physician Assistant

## 2024-08-12 DIAGNOSIS — Z981 Arthrodesis status: Secondary | ICD-10-CM | POA: Diagnosis not present

## 2024-08-12 DIAGNOSIS — M5126 Other intervertebral disc displacement, lumbar region: Secondary | ICD-10-CM | POA: Diagnosis not present

## 2024-08-12 DIAGNOSIS — M47814 Spondylosis without myelopathy or radiculopathy, thoracic region: Secondary | ICD-10-CM | POA: Diagnosis not present

## 2024-08-12 DIAGNOSIS — M5414 Radiculopathy, thoracic region: Secondary | ICD-10-CM

## 2024-08-12 DIAGNOSIS — M5124 Other intervertebral disc displacement, thoracic region: Secondary | ICD-10-CM | POA: Diagnosis not present

## 2024-08-12 DIAGNOSIS — M4326 Fusion of spine, lumbar region: Secondary | ICD-10-CM

## 2024-08-12 DIAGNOSIS — M47816 Spondylosis without myelopathy or radiculopathy, lumbar region: Secondary | ICD-10-CM | POA: Diagnosis not present

## 2024-08-12 DIAGNOSIS — M5416 Radiculopathy, lumbar region: Secondary | ICD-10-CM

## 2024-08-12 DIAGNOSIS — M4314 Spondylolisthesis, thoracic region: Secondary | ICD-10-CM | POA: Diagnosis not present

## 2024-08-12 DIAGNOSIS — M545 Low back pain, unspecified: Secondary | ICD-10-CM | POA: Diagnosis not present

## 2024-08-12 MED ORDER — IOPAMIDOL (ISOVUE-M 300) INJECTION 61%
10.0000 mL | Freq: Once | INTRAMUSCULAR | Status: AC | PRN
  Administered 2024-08-12: 10 mL via INTRATHECAL

## 2024-08-12 MED ORDER — MEPERIDINE HCL 50 MG/ML IJ SOLN: 50.0000 mg | Freq: Once | INTRAMUSCULAR | Status: DC | PRN

## 2024-08-12 MED ORDER — DIAZEPAM 5 MG PO TABS: 10.0000 mg | ORAL_TABLET | Freq: Once | ORAL | Status: DC

## 2024-08-12 MED ORDER — ONDANSETRON HCL 4 MG/2ML IJ SOLN: 4.0000 mg | Freq: Once | INTRAMUSCULAR | Status: DC | PRN

## 2024-08-13 DIAGNOSIS — M25551 Pain in right hip: Secondary | ICD-10-CM | POA: Diagnosis not present

## 2024-08-13 DIAGNOSIS — M51362 Other intervertebral disc degeneration, lumbar region with discogenic back pain and lower extremity pain: Secondary | ICD-10-CM | POA: Diagnosis not present

## 2024-08-13 DIAGNOSIS — M4326 Fusion of spine, lumbar region: Secondary | ICD-10-CM | POA: Diagnosis not present

## 2024-08-13 DIAGNOSIS — M5416 Radiculopathy, lumbar region: Secondary | ICD-10-CM | POA: Diagnosis not present

## 2024-08-18 ENCOUNTER — Telehealth: Payer: Self-pay

## 2024-08-18 ENCOUNTER — Ambulatory Visit: Admitting: Psychology

## 2024-08-18 DIAGNOSIS — F902 Attention-deficit hyperactivity disorder, combined type: Secondary | ICD-10-CM

## 2024-08-18 DIAGNOSIS — F3341 Major depressive disorder, recurrent, in partial remission: Secondary | ICD-10-CM

## 2024-08-18 DIAGNOSIS — F4310 Post-traumatic stress disorder, unspecified: Secondary | ICD-10-CM | POA: Diagnosis not present

## 2024-08-18 DIAGNOSIS — M549 Dorsalgia, unspecified: Secondary | ICD-10-CM | POA: Diagnosis not present

## 2024-08-18 DIAGNOSIS — F411 Generalized anxiety disorder: Secondary | ICD-10-CM | POA: Diagnosis not present

## 2024-08-18 DIAGNOSIS — G43009 Migraine without aura, not intractable, without status migrainosus: Secondary | ICD-10-CM | POA: Diagnosis not present

## 2024-08-18 DIAGNOSIS — G4710 Hypersomnia, unspecified: Secondary | ICD-10-CM | POA: Diagnosis not present

## 2024-08-18 DIAGNOSIS — E039 Hypothyroidism, unspecified: Secondary | ICD-10-CM | POA: Diagnosis not present

## 2024-08-18 NOTE — Progress Notes (Unsigned)
                edge,  experiencing concentration difficulties, having trouble falling or staying asleep, exhibiting a general  state of irritability).: No Description Entered (Status: improved). Motor tension (e.g., restlessness,  tiredness, shakiness, muscle tension).: No Description Entered (Status: improved).  Problems Addressed  Anxiety, Phase Of Life Problems, Anxiety  Goals 1. Learn and implement coping skills that result in a reduction of anxiety  and worry, and improved daily functioning. Objective Learn  and implement calming skills to reduce overall anxiety and manage anxiety symptoms. Target Date: 2025-08-09Frequency: Weekly Progress: 40 Modality: individual  Related Interventions 1. Teach the client calming/relaxation skills (e.g., applied relaxation, progressive muscle  relaxation, cue controlled relaxation; mindful breathing; biofeedback) and how to discriminate  better between relaxation and tension; teach the client how to apply these skills to his/her daily  life (e.g., New Directions in Progressive Muscle Relaxation by Marcelyn Ditty, and  Hazlett-Stevens; Treating Generalized Anxiety Disorder by Rygh and Ida Rogue). Objective Identify, challenge, and replace biased, fearful self-talk with positive, realistic, and empowering selftalk. Target Date: 2024-07-10 Frequency: weekly Progress: 30 Modality: individual Related Interventions 1. Explore the client's schema and self-talk that mediate his/her fear response; assist him/her in  challenging the biases; replace the distorted messages with reality-based alternatives and  positive, realistic self-talk that will increase his/her self-confidence in coping with irrational  fears (see Cognitive Therapy of Anxiety Disorders by Laurence Slate). Objective Learn and implement problem-solving strategies for realistically addressing worries. Target Date: 2025-08-09Frequency: weekly Progress: 40 Modality: individual 2. Resolve conflicted feelings and adapt to the new life circumstances. Objective Apply problem-solving skills to current circumstances. Target Date: 2024-07-10 Frequency: weekly Progress: 20 Modality: individual Related Interventions 1. Teach the client problem-resolution skills (e.g., defining the problem clearly, brainstorming  multiple solutions, listing the pros and cons of each solution, seeking input from others,  selecting and implementing a plan of action, evaluating outcome, and readjusting plan as   necessary).   3. Stabilize anxiety level while increasing ability to function on a daily  basis. Diagnosis F33.1  Major depressive disorder, moderate 300.02 (Generalized anxiety disorder) - Open - [Signifier: n/a]  Axis  none 309.28 (Adjustment disorder with mixed anxiety and depressed  mood) - Open - [Signifier: n/a]  Adjustment Disorder,  With Anxiety   Marital conflict  Major Depressive disorder, moderate  Conditions For Discharge Achievement of treatment goals and objectives.  The patient approved this plan.   Deonna Krummel G Ethridge Sollenberger, LCSW

## 2024-08-18 NOTE — Telephone Encounter (Signed)
 Asked Paige [name] to bring in her BiPap for her appt with Dr. Theodoro on 08/19/24. Pt stated that she would bring it with her.

## 2024-08-19 ENCOUNTER — Ambulatory Visit

## 2024-08-19 VITALS — BP 107/75 | HR 87 | Ht 67.0 in | Wt 228.6 lb

## 2024-08-19 DIAGNOSIS — R918 Other nonspecific abnormal finding of lung field: Secondary | ICD-10-CM

## 2024-08-19 DIAGNOSIS — G4733 Obstructive sleep apnea (adult) (pediatric): Secondary | ICD-10-CM | POA: Diagnosis not present

## 2024-08-19 DIAGNOSIS — E66811 Obesity, class 1: Secondary | ICD-10-CM

## 2024-08-19 DIAGNOSIS — R400 Somnolence: Secondary | ICD-10-CM

## 2024-08-19 DIAGNOSIS — Z6835 Body mass index (BMI) 35.0-35.9, adult: Secondary | ICD-10-CM

## 2024-08-19 NOTE — Progress Notes (Signed)
 New Patient Pulmonology Office Visit   Subjective:  Patient ID: ALAIRA [name], female    DOB: [birth year]  MRN: [id number]  Referred by: Jefferey Fitch, MD  CC:  Chief Complaint  Patient presents with   Consult    Sleep - wants to get established here.  Uses BiPAP every night. Last sleep study was in August or September of 2024.     HPI Paige [name] is a 58 y.o. female with OSA, RLS, class 1 obesity, anxiety/depression follows psych, allergic rhinitis, hypothyroidism presents for evaluation of OSA. Transfer from [facility].  Used to be a Engineer, civil (consulting).   OSA history: Hst may 2024:  dx w OSA. Unable to tolerate. Hence underwent titration study in sept 2024. Set up on BIPAP 15/10.   Using it through night. Feels better since using it.   Anx/depression: on SNRI, trazodone , NDRI, clonazepam  and ritalin  and vraylar .   Mouth breather:  Preferred sleeping position: usually side but since her right hip pain sleeping on back.   Sleep related Symptoms: on BIPAP, Has FFM.  Snoring- yes when on back.  Witnessed apnea- yes when on her back.  Gasping/choking- yes when on back.  morning HA/dry mouth- occasional/y tired on awakening, excessive daytime sleepiness- still feels tired through day. Better since on bipap. On ritalin .  Restless legs- on requip  2mg  bid mg few years. Also on gabapentin  300 TID. [name] During nighttime.   Sleep routine:  -Bed: 10-11p. Quickly w trazodone .  -Nocturnal awakenings: 1-5 times. Because of pain or RLS.  -Wake: 5-6 a. Feels refreshed.  -Napping:since back pain she is taking more naps due to poor sleep at night.    Habits: -Caffeine: no -Alcohol : no -Nicotine:never.   PAP download compliance data:  Encore/Airview: resmed 11. 6/20-9/17 2025 Pressure:15/10 BIPAP.  Hours of usage: 7 hr/night.  Days used >4hr: 88/90 Leak:9.3.  JYP:rzwumjo 3.9, AHI 5.   PRIOR TESTS and IMAGING: PSG/HSAT: I do not have report of sleep study.    ECHO: 2013: EF 55%.  Spiro 2017: normal.   CT chest done outside 04/2024: I do not have images to review. Reportedly had right hilar adenopathy vs mass 2x2.5 cm.       08/19/2024    1:00 PM  Results of the Epworth flowsheet  Sitting and reading 2  Watching TV 3  Sitting, inactive in a public place (e.g. a theatre or a meeting) 1  As a passenger in a car for an hour without a break 3  Lying down to rest in the afternoon when circumstances permit 3  Sitting and talking to someone 1  Sitting quietly after a lunch without alcohol  1  In a car, while stopped for a few minutes in traffic 1  Total score 15    Allergies: Tape, Topiramate , Cyclobenzaprine, Flexeril [cyclobenzaprine hcl], Meloxicam, Naltrexone, Pork (porcine) protein, Sumatriptan, Wound dressing adhesive, Brexpiprazole, Glycopyrrolate , Imitrex [sumatriptan base], Lamotrigine , and Sulfa antibiotics  Current Outpatient Medications:    Albuterol-Budesonide (AIRSUPRA ) 90-80 MCG/ACT AERO, Inhale 1 each into the lungs 2 (two) times daily., Disp: , Rfl:    Albuterol-Budesonide (AIRSUPRA ) 90-80 MCG/ACT AERO, Inhale 1 each into the lungs 2 (two) times daily., Disp: 1 g, Rfl: 11   buPROPion  (WELLBUTRIN  XL) 150 MG 24 hr tablet, TAKE 3 TABLETS EVERY MORNING (Patient taking differently: TAKE 1 TABLET EVERY MORNING), Disp: 270 tablet, Rfl: 3   cariprazine  (VRAYLAR ) 3 MG capsule, Take 1 capsule (3 mg total) by mouth daily., Disp: 90 capsule, Rfl:  0   clonazePAM  (KLONOPIN ) 0.5 MG tablet, Take 1 tablet (0.5 mg total) by mouth 2 (two) times daily., Disp: 60 tablet, Rfl: 2   diazepam  (VALIUM ) 5 MG tablet, Place 1 tablet vaginally nightly as needed for muscle spasm/ pelvic pain., Disp: 30 tablet, Rfl: 0   docusate sodium  (COLACE) 100 MG capsule, Take 100 mg by mouth 2 (two) times daily., Disp: , Rfl:    DULoxetine  (CYMBALTA ) 60 MG capsule, Take 1 capsule (60 mg total) by mouth 2 (two) times daily., Disp: 180 capsule, Rfl: 3   Erenumab -aooe  (AIMOVIG ) 70 MG/ML SOAJ, INJECT 70 MG INTO THE SKIN EVERY 28 DAYS., Disp: 1 mL, Rfl: 11   estradiol  (ESTRACE ) 0.1 MG/GM vaginal cream, Place 0.5 g vaginally 2 (two) times a week. Place 0.5g nightly for two weeks then twice a week after, Disp: 42.5 g, Rfl: 11   estradiol  (ESTRACE ) 0.5 MG tablet, Take 0.5 mg by mouth daily., Disp: , Rfl:    Fe Fum-Fe Poly-Vit C-Lactobac (FUSION) 65-65-25-30 MG CAPS, Take by mouth., Disp: , Rfl:    fluticasone (FLONASE SENSIMIST) 27.5 MCG/SPRAY nasal spray, 2 sprays (1 spray in each nostril) Nasally Once a day during seasons of difficulty for 30 days, Disp: , Rfl:    ibuprofen (ADVIL) 800 MG tablet, Take 800 mg by mouth 3 (three) times daily., Disp: , Rfl:    ipratropium (ATROVENT) 0.06 % nasal spray, Place 1 spray into both nostrils daily., Disp: , Rfl:    levothyroxine  (SYNTHROID , LEVOTHROID) 88 MCG tablet, Take 88 mcg by mouth daily before breakfast., Disp: , Rfl:    liothyronine (CYTOMEL) 5 MCG tablet, Take 5 mcg by mouth daily., Disp: , Rfl:    Magnesium Citrate (MAGNESIUM GUMMIES PO), Take 125 mg by mouth 2 (two) times daily., Disp: , Rfl:    metFORMIN  (GLUCOPHAGE ) 500 MG tablet, 1 po with lunch daily, Disp: 30 tablet, Rfl: 0   Multiple Vitamins-Minerals (MULTIVITAMIN GUMMIES ADULT PO), Take 1 each by mouth daily., Disp: , Rfl:    nystatin cream (MYCOSTATIN), Apply 1 application  topically daily as needed for dry skin., Disp: , Rfl:    Olopatadine HCl 0.2 % SOLN, 1 drop into affected eye Ophthalmic Once a day or prn for 30 days, Disp: , Rfl:    omeprazole  (PRILOSEC) 20 MG capsule, Take 20 mg by mouth 2 (two) times daily before a meal., Disp: , Rfl:    ondansetron  (ZOFRAN -ODT) 4 MG disintegrating tablet, Take 1 tablet (4 mg total) by mouth every 8 (eight) hours as needed for nausea or vomiting., Disp: 20 tablet, Rfl: 11   Oxycodone  HCl 10 MG TABS, Take 10 mg by mouth every 6 (six) hours as needed., Disp: , Rfl:    potassium chloride  (K-DUR,KLOR-CON ) 10 MEQ tablet,  Take 1 tablet by mouth daily., Disp: , Rfl:    progesterone  (PROMETRIUM ) 100 MG capsule, Take 100 mg by mouth daily., Disp: , Rfl:    promethazine  (PHENERGAN ) 12.5 MG tablet, Take 1 tablet (12.5 mg total) by mouth every 8 (eight) hours as needed for nausea or vomiting., Disp: 30 tablet, Rfl: 0   Propylene Glycol (SYSTANE COMPLETE) 0.6 % SOLN, Apply 2 drops to eye., Disp: , Rfl:    rOPINIRole  (REQUIP ) 2 MG tablet, Take 2 mg by mouth 2 (two) times daily., Disp: , Rfl:    Tenapanor HCl (IBSRELA ) 50 MG TABS, Take 50 mg by mouth 2 (two) times daily before a meal., Disp: 60 tablet, Rfl: 3   tiZANidine (ZANAFLEX) 4  MG tablet, Take 4 mg by mouth 2 (two) times daily., Disp: , Rfl:    torsemide (DEMADEX) 20 MG tablet, Take 20 mg by mouth 2 (two) times daily., Disp: , Rfl:    traZODone  (DESYREL ) 100 MG tablet, Take 2 tablets (200 mg total) by mouth at bedtime., Disp: 180 tablet, Rfl: 3   Ubrogepant  (UBRELVY ) 100 MG TABS, Take 1 tablet (100 mg total) by mouth daily as needed (at onset of headaches may repeat dose after 2 hours as needed max 200 mg in 24 hours)., Disp: 16 tablet, Rfl: 11   Vitamin D -Vitamin K (VITAMIN K2-VITAMIN D3 PO), Take 1 each by mouth daily. Vitamin K2 100 mcg/Vitamin D  50 mcg per gummie, Disp: , Rfl:    Prucalopride Succinate  (MOTEGRITY ) 2 MG TABS, Take 1 tablet (2 mg total) by mouth daily. (Patient not taking: Reported on 08/19/2024), Disp: 30 tablet, Rfl: 3 Past Medical History:  Diagnosis Date   Allergic rhinitis    Allergy    Anemia    Anxiety disorder    Back pain    Bilateral swelling of feet    Bronchitis    Cancer (HCC)    skin   Chronic fatigue    Chronic fatigue    Chronic headaches    Chronic pain    Constipation    Depression    Endometriosis    Family history of colon cancer    mother,uncles,aunts   Fibromyalgia    Fibromyalgia    Gallbladder problem    GERD (gastroesophageal reflux disease)    Hyperlipidemia    Hypothyroidism    IBS (irritable bowel  syndrome)    Insomnia    Iron  deficiency    Joint pain    Laryngopharyngeal reflux (LPR)    Obesity    Osteoarthritis    Personal history of colonic polyps 04/2006   polypoid   PONV (postoperative nausea and vomiting)    RLS (restless legs syndrome)    Sleep apnea    no cpap   SOB (shortness of breath)    Swelling    Vitamin B12 deficiency    Vitamin D  deficiency    Past Surgical History:  Procedure Laterality Date   ANTERIOR CRUCIATE LIGAMENT REPAIR Left 2003   APPENDECTOMY  1993   BACK SURGERY  03/2024   spinal cord stimulator placed   CARPAL TUNNEL RELEASE Left 09/25/2017   Procedure: LEFT CARPAL TUNNEL RELEASE;  Surgeon: Murrell Kuba, MD;  Location: [facility];  Service: Orthopedics;  Laterality: Left;   CARPAL TUNNEL RELEASE Right    CHOLECYSTECTOMY  1999   COLONOSCOPY  2009   [facility]   DE QUERVAIN'S RELEASE     LUMBAR LAMINECTOMY     L4- S1   PANNICULECTOMY     POLYPECTOMY     REPAIR ANKLE LIGAMENT Left 2001   ROUX-EN-Y GASTRIC BYPASS  04/2004   Dr Donnice Lunger   TONSILLECTOMY AND ADENOIDECTOMY  1976   TRIGGER FINGER RELEASE     Family History  Problem Relation Age of Onset   Obesity Mother    Depression Mother    Cancer Mother    Heart disease Mother    Hyperlipidemia Mother    Hypertension Mother    Colon cancer Mother        uncles,aunts   Sleep apnea Father    Cancer Father    Diabetes Father        paternal grandmother,sister   Leukemia Father    Lymphoma Father  Skin cancer Father    Heart disease Father    Colon polyps Sister    Diabetes Sister    Colon cancer Maternal Aunt    Colon cancer Maternal Uncle    Colon cancer Cousin    Colon polyps Cousin    Stomach cancer Neg Hx    Esophageal cancer Neg Hx    Rectal cancer Neg Hx    Social History   Socioeconomic History   Marital status: Significant Other    Spouse name: Leonce Ee   Number of children: 0   Years of education: Not on file   Highest education  [name]: Not on file  Occupational History   Occupation: RN/disabled  Tobacco Use   Smoking status: Never   Smokeless tobacco: Never  Vaping Use   Vaping status: Never Used  Substance and Sexual Activity   Alcohol  use: Not Currently    Alcohol /week: 0.0 standard drinks of alcohol     Comment: rarely   Drug use: No   Sexual activity: Yes    Birth control/protection: Other-see comments  Other Topics Concern   Not on file  Social History Narrative   Patient is left-handed. She lives alone in a one [name] home. She has 3 cats. She is unable to exercise. Caffeine one soda / day. On disability now. 2 bachelor degrees   Social Drivers of Health   Financial Resource Strain: Low Risk  (04/06/2024)   Received from [facility]   Overall Financial Resource Strain (CARDIA)    Difficulty of Paying Living Expenses: Not hard at all  Food Insecurity: Low Risk  (08/05/2024)   Received from [facility]   Hunger Vital Sign    Within the past 12 months, you worried that your food would run out before you got money to buy more: Never true    Within the past 12 months, the food you bought just didn't last and you didn't have money to get more. : Never true  Transportation Needs: No Transportation Needs (08/05/2024)   Received from [facility]    In the past 12 months, has lack of reliable transportation kept you from medical appointments, meetings, work or from getting things needed for daily living? : No  Physical Activity: Not on file  Stress: Not on file  Social Connections: Not on file  Intimate Partner Violence: Not on file       Objective:  BP 107/75   Pulse 87   Ht 5' 7 (1.702 m)   Wt 228 lb 9.6 oz (103.7 kg)   SpO2 96% Comment: RA  BMI 35.80 kg/m  BMI Readings from Last 3 Encounters:  08/19/24 35.80 kg/m  08/11/24 35.71 kg/m  07/15/24 37.43 kg/m    Physical Exam: CONSTITUTIONAL: NAD, well-appearing NASAL/OROPHARYNX:  Normal mucosa. No septal deviation. No  hypertrophy of inferior turbinates. Modified Mallampati score 1-2. Tonsillectomy.  CV: RRR s1s2 nl, no murmurs  RESP: Clear to auscultation, normal respiratory effort   NEURO: CN II/XII grossly intact PSYCH: Alert & oriented x 3, Euthymic, appropriate affect  Diagnostic Review:  Last metabolic panel Lab Results  Component Value Date   GLUCOSE 95 05/25/2024   NA 141 05/25/2024   K 4.2 05/25/2024   CL 99 05/25/2024   CO2 27 05/25/2024   BUN 16 05/25/2024   CREATININE 0.86 05/25/2024   EGFR 78 05/25/2024   CALCIUM 9.2 05/25/2024   PROT 6.3 05/25/2024   ALBUMIN 4.2 05/25/2024   LABGLOB 2.1 05/25/2024   BILITOT  0.3 05/25/2024   ALKPHOS 103 05/25/2024   AST 32 05/25/2024   ALT 38 (H) 05/25/2024         Assessment & Plan:   Assessment & Plan OSA (obstructive sleep apnea) Decently controlled on BIPAP. Low residual but has central events.  Still sleepy despite decent control due to poor sleep from back pain.  Will advise sleeping on side consistently and follow up.  May need titration study in future.  Need sleep study report from [facility].  Drowsiness More prominent due to poor sleep from back pain. Likely contributed from OSA.  Cont pain control.  On ritalin  on 10 BID. Tolerating well. Was on modafinil before. Cont ritalin . Helps w her sleepiness.  Obesity, Class I, BMI 30-34.9 Follows weight loss clinic. On diet and lifestyle modification. Also, on well butrin.  Hilar mass She was not aware of this. Advised to get CD of CT scan.  Getting CT chest to evaluate the size now. If growing now we will get a biopsy.   Orders Placed This Encounter  Procedures   CT Chest Wo Contrast    She was counselled about not driving while drowsy which is common side effect of sleep related disorders.   Return in about 8 weeks (around 10/14/2024).   Chenise Mulvihill, MD

## 2024-08-19 NOTE — Patient Instructions (Addendum)
 Notification of test results are managed in the following manner: If there are any recommendations or changes to the plan of care discussed in office today, we will contact you and let you know what they are. If you do not hear from us , then your results are normal/expected and you can view them through your MyChart account, or a letter will be sent to you. Thank you again for trusting us  with your care [facility].  CT scan for hilar mass.   Side sleep. Try getting devices to help on side sleep.

## 2024-08-20 DIAGNOSIS — G4733 Obstructive sleep apnea (adult) (pediatric): Secondary | ICD-10-CM | POA: Diagnosis not present

## 2024-08-24 DIAGNOSIS — M48062 Spinal stenosis, lumbar region with neurogenic claudication: Secondary | ICD-10-CM | POA: Diagnosis not present

## 2024-08-24 DIAGNOSIS — M5416 Radiculopathy, lumbar region: Secondary | ICD-10-CM | POA: Diagnosis not present

## 2024-08-25 ENCOUNTER — Ambulatory Visit
Admission: RE | Admit: 2024-08-25 | Discharge: 2024-08-25 | Disposition: A | Discharge status: Home / Self Care | Source: Ambulatory Visit

## 2024-08-25 DIAGNOSIS — R918 Other nonspecific abnormal finding of lung field: Secondary | ICD-10-CM | POA: Diagnosis not present

## 2024-08-25 NOTE — Therapy (Unsigned)
 OUTPATIENT PHYSICAL THERAPY FEMALE PELVIC EVALUATION   Patient Name: Paige [name] MRN: [id number] DOB:19-Apr-1966, 58 y.o., female Today's Date: 08/26/2024  END OF SESSION:  PT End of Session - 08/26/24 1406     Visit Number 1    Date for Recertification  11/18/24    Authorization Type [employer] medicare    Authorization - Visit Number 1    Authorization - Number of Visits 10    PT Start Time 1400    PT Stop Time 1445    PT Time Calculation (min) 45 min    Activity Tolerance Patient tolerated treatment well    Behavior During Therapy WFL for tasks assessed/performed          Past Medical History:  Diagnosis Date   Allergic rhinitis    Allergy    Anemia    Anxiety disorder    Back pain    Bilateral swelling of feet    Bronchitis    Cancer (HCC)    skin   Chronic fatigue    Chronic fatigue    Chronic headaches    Chronic pain    Constipation    Depression    Endometriosis    Family history of colon cancer    mother,uncles,aunts   Fibromyalgia    Fibromyalgia    Gallbladder problem    GERD (gastroesophageal reflux disease)    Hyperlipidemia    Hypothyroidism    IBS (irritable bowel syndrome)    Insomnia    Iron  deficiency    Joint pain    Laryngopharyngeal reflux (LPR)    Obesity    Osteoarthritis    Personal history of colonic polyps 04/2006   polypoid   PONV (postoperative nausea and vomiting)    RLS (restless legs syndrome)    Sleep apnea    no cpap   SOB (shortness of breath)    Swelling    Vitamin B12 deficiency    Vitamin D  deficiency    Past Surgical History:  Procedure Laterality Date   ANTERIOR CRUCIATE LIGAMENT REPAIR Left 2003   APPENDECTOMY  1993   BACK SURGERY  03/2024   spinal cord stimulator placed   CARPAL TUNNEL RELEASE Left 09/25/2017   Procedure: LEFT CARPAL TUNNEL RELEASE;  Surgeon: Murrell Kuba, MD;  [facility]:  [facility];  Service: Orthopedics;  Laterality: Left;   CARPAL TUNNEL RELEASE Right     CHOLECYSTECTOMY  1999   COLONOSCOPY  2009   [facility]   DE QUERVAIN'S RELEASE     LUMBAR LAMINECTOMY     L4- S1   PANNICULECTOMY     POLYPECTOMY     REPAIR ANKLE LIGAMENT Left 2001   ROUX-EN-Y GASTRIC BYPASS  04/2004   Dr Donnice Lunger   TONSILLECTOMY AND ADENOIDECTOMY  1976   TRIGGER FINGER RELEASE     Patient Active Problem List   Diagnosis Date Noted   High-tone pelvic floor dysfunction 05/18/2024   SUI (stress urinary incontinence, female) 05/18/2024   Overactive bladder 05/18/2024   Incontinence of feces 05/18/2024   Vaginal atrophy 05/18/2024   Chronic idiopathic constipation 05/18/2024   Nutritional deficiency 03/17/2024   OSA on CPAP 03/17/2024   Encounter for orthopedic follow-up care 01/31/2020   Pain of left hand 12/19/2018   Radial styloid tenosynovitis 12/19/2018   Trigger finger 12/19/2018   Abscess of postoperative wound of abdominal wall 10/05/2018   Pannus, abdominal 09/21/2018   Pigmented skin lesion 06/23/2018   Cervicalgia 02/12/2018   Bilateral carpal tunnel syndrome 08/13/2017  Primary osteoarthritis of both first carpometacarpal joints 08/13/2017   Muscle tension dysphonia 08/12/2016   Vocal cord atrophy 08/12/2016   Hoarseness 07/02/2016   Laryngopharyngeal reflux (LPR) 07/02/2016   Cough 05/23/2016   Dyspnea 05/23/2016   Chronic pain 05/22/2016   Sacrococcygeal disorders, not elsewhere classified 11/24/2015   Anemia, iron  deficiency 08/24/2015   Fibrositis 08/17/2015   Abnormal weight gain 08/17/2015   Anxiety 08/17/2015   Recurrent major depressive episodes 08/17/2015   History of migraine headaches 08/17/2015   Neurosis, posttraumatic 08/17/2015   Bariatric surgery status 08/17/2015   DDD (degenerative disc disease), lumbar 01/18/2015   Facet joint syndrome 01/18/2015   Thoracic spondylosis 01/18/2015   Myofascial pain 02/22/2013   Back ache 07/09/2012   Family history of malignant neoplasm of gastrointestinal tract 06/10/2011    History of colonic polyps 06/10/2011   Esophageal reflux 06/10/2011   Cellulitis 06/10/2011   History of Roux-en-Y gastric bypass 06/10/2011   B12 nutritional deficiency 06/10/2011   Morbid obesity due to excess calories (HCC) 06/10/2011   Personal history of fibromyalgia 06/10/2011   Recurrent major depressive disorder, in partial remission 06/10/2011   Acid reflux 06/10/2011    PCP: Viona Fitch, MD  REFERRING PROVIDER: Marilynne Rosaline SAILOR, MD   REFERRING DIAG:  M62.89 (ICD-10-CM) - High-tone pelvic floor dysfunction  N39.3 (ICD-10-CM) - SUI (stress urinary incontinence, female)  N32.81 (ICD-10-CM) - Overactive bladder  R15.9 (ICD-10-CM) - Incontinence of feces, unspecified fecal incontinence type    THERAPY DIAG:  Muscle weakness (generalized)  Other lack of coordination  Scar  Pelvic pain  Rationale for Evaluation and Treatment: Rehabilitation  ONSET DATE: 12/2023  SUBJECTIVE:                                                                                                                                                                                           SUBJECTIVE STATEMENT: Patient reports she is constipated and not able to have bowel movement. Patient had a back injection and waiting for the pain to reduce in right leg. Uses Estrogen cream Fluid intake:  100oz water, 1 small bottle diet dr pepper per day. Has an occasional coffee. Usually only has sips of water before bed.   FUNCTIONAL LIMITATIONS: Patient uses a stick for walking  PERTINENT HISTORY:  Surgeries: Cholecystectomy; appendectomy; back surgery with spinal cored stimulator; Roux-EN-Y gastric bypass Other: Skin Caner; Fibromyalgia; IBS; Hypothyroidism;    PAIN:  Are you having pain? Yes NPRS scale: 5/10 Pain location: lower abdominal area  Pain type: aching Pain description: constant   Aggravating factors: needing to have a bowel movement and not able to Relieving factors: heat  at  one tim  PRECAUTIONS: Other: skin cancer; spinal cord stimulator  RED FLAGS: None   WEIGHT BEARING RESTRICTIONS: No  FALLS:  Has patient fallen in last 6 months? No  OCCUPATION: disability  ACTIVITY LEVEL : Patient has stopped exercising since 9/1 due to back pain  PLOF: Independent  PATIENT GOALS: be able to urinate better, not have to use an enema to have a bowel movement, reduce pelvic pain   BOWEL MOVEMENT: Pain with bowel movement: Yes, 8-9/10 Type of bowel movement:0-4 time(s) per week ; strains; Type 1, hard rocks Fully empty rectum: No Leakage: Consistency with leakage: soft                                                      Caused by: small amounts weekly, usually after an incomplete BM  Pads: Yes: depends at night Fiber supplement/laxative stool softener (dulcolax 5mg  BID), Ibrelsa BID   URINATION: Pain with urination: Yes, burning of retention Fully empty bladder: Nonot able to urinate right away, sometimes needs to run water; she will wipe then urinate again                          Post-void dribble: No Stream: Weak Urgency: No Frequency:during the day  8+                                                          Nocturia: Yes:  2-6 times per night to void    Leakage: Coughing, Sneezing, Laughing, and without sensation Pads/briefs:  0-2 pads per day ; depends  at night  INTERCOURSE:  Ability to have vaginal penetration Yes  Pain with intercourse: Initial Penetration and Deep Penetration Dryness: Yes    PROLAPSE: None   OBJECTIVE:  Note: Objective measures were completed at Evaluation unless otherwise noted.  DIAGNOSTIC FINDINGS:  Pelvic floor strength I/V, puborectalis I/V external anal sphincter I/V   Pelvic floor musculature: Right levator tender, Right obturator tender, Left levator tender, Left obturator tender. High tone throughout Post Void Residual 10 mL      COGNITION: Overall cognitive status: Within functional limits for tasks  assessed     SENSATION: Light touch: Appears intact   GAIT: Assistive device utilized: walking stick  POSTURE: rounded shoulders, forward head, and decreased lumbar lordosis; Right shoulder higher; patient had scoliosos   LUMBARAROM/PROM:not tested due to fusion    LOWER EXTREMITY MNF:qloo bilateral hip ROM   LOWER EXTREMITY MMT:  MMT Right eval Left eval  Hip flexion 4/5 4/5  Hip extension 4/5 4/5  Hip abduction 3/5 3/5   (Blank rows = not tested) PALPATION:  Abdominal: tenderness of the lower abdomen; increased lower rib cage angle  Diastasis: No Distortion: No  Breathing: contract the abdominals and will raise the lower rib cage Scar tissue: Yes: along the lower abdomen, lower umbilicus,                 External Perineal Exam: redness along the posterior introitus, tenderness located on the perineal body and around the rectum  Internal Pelvic Floor: therapist was not able to get the index finger past the DIP joint into the vaginal canal due to pain; going through the rectum the puborectalis did not move forward well, tenderness throughout the levator ani and obturator internist and in the rectum.   Patient confirms identification and approves PT to assess internal pelvic floor and treatment Yes No emotional/communication barriers or cognitive limitation. Patient is motivated to learn. Patient understands and agrees with treatment goals and plan. PT explains patient will be examined in standing, sitting, and lying down to see how their muscles and joints work. When they are ready, they will be asked to remove their underwear so PT can examine their perineum. The patient is also given the option of providing their own chaperone as one is not provided in our facility. The patient also has the right and is explained the right to defer or refuse any part of the evaluation or treatment including the internal exam. With the patient's consent, PT will use  one gloved finger to gently assess the muscles of the pelvic floor, seeing how well it contracts and relaxes and if there is muscle symmetry. After, the patient will get dressed and PT and patient will discuss exam findings and plan of care. PT and patient discuss plan of care, schedule, attendance policy and HEP activities.   PELVIC MMT:   MMT eval  Vaginal 2/5  Internal Anal Sphincter 2/5  External Anal Sphincter 2/5  Puborectalis 2/5  (Blank rows = not tested)        TONE: Increased tone of the pelvic floor  PROLAPSE: none  TODAY'S TREATMENT:                                                                                                                              DATE: 08/26/24  EVAL Examination completed, findings reviewed, pt educated on POC, HEP, and female pelvic floor anatomy, reasoning with pelvic floor assessment internally with pt consent, and abdominal massage. Pt motivated to participate in PT and agreeable to attempt recommendations.     PATIENT EDUCATION:  08/26/24 Education details: Access Code: [email or web address] Person educated: Patient Education method: Explanation, Demonstration, Tactile cues, Verbal cues, and Handouts Education comprehension: verbalized understanding, returned demonstration, verbal cues required, tactile cues required, and needs further education  HOME EXERCISE PROGRAM: 08/26/24 Access Code: [email or web address] URL: [email or web address] Date: 08/26/2024 Prepared by: Channing Pereyra  Exercises - Supine Diaphragmatic Breathing  - 2 x daily - 7 x weekly - 1 sets - 10 reps - Seated Diaphragmatic Breathing  - 2 x daily - 7 x weekly - 1 sets - 10 reps  Patient Education - Abdominal Massage for Constipation - Abdominal Massage for Constipation  ASSESSMENT:  CLINICAL IMPRESSION: Patient is a 58 y.o. female who was seen today for physical therapy evaluation and treatment for high toned pelvic floor, urinary and fecal incontinence.Patient reports she  has lower abdominal pain at level 5/10 when she has  not had a bowel movement in a few days. She has a lower abdominal scar that is restricted and tight. She strains to have a bowel movement with pain at level 8-9/10 and has one 1-4 days. Her stool are hard type 1. Patient reports she has not had fecal leakage lately instead is constipated. She has to use an enema for a bowel movement. She has difficulty with starting a urine stream and has burning. Sometimes she has to run water to start her urine stream. She urinates at night up to 6 times. She leaks urine with coughing, sneezing, laughing, and without sensation. She will wear depends at night and not always during the day. Posterior introitus is red and painful. She has not used her vaginal estrogen in awhile so therapist explained to patient on how the estrogen can help her tissue. Therapist was only able to place her index finger to the DIP vaginally due to the pain. Vaginal strength was 2/5. Patient had tenderness located around the rectum and perineal body. Her puborectalis was not coming forward well due to increased tone of the pelvic floor. Rectal strength is 2/5. Patient will benefit from skilled therapy to improve pelvic floor muscle excursion, reduce pain and coordination to help with continence and toileting.   OBJECTIVE IMPAIRMENTS: decreased activity tolerance, decreased coordination, decreased strength, increased fascial restrictions, increased muscle spasms, and pain.   ACTIVITY LIMITATIONS: sleeping, continence, and toileting  PARTICIPATION LIMITATIONS: meal prep, cleaning, laundry, interpersonal relationship, shopping, and community activity  PERSONAL FACTORS: Cholecystectomy; appendectomy; back surgery with spinal cored stimulator; Roux-EN-Y gastric bypass are also affecting patient's functional outcome.   REHAB POTENTIAL: Excellent  CLINICAL DECISION MAKING: Unstable/unpredictable  EVALUATION COMPLEXITY: High   GOALS: Goals  reviewed with patient? Yes  SHORT TERM GOALS: Target date: 09/23/24  Patient educated on abdominal massage to assist with peristalic motion of the intestines.  Baseline: Goal status: INITIAL  2.  Patient educated on scar mobilization to improve mobility and reduce pain.  Baseline:  Goal status: INITIAL  3.  Patient is able to perform diaphragmatic breathing to lengthen the pelvic floor to urinate and have a bowel movement.  Baseline:  Goal status: INITIAL  4.  Patient educated on correct ways to have a bowel movement to reduce straining.  Baseline:  Goal status: INITIAL  LONG TERM GOALS: Target date: 11/18/24  Patient independent with advanced HEP for core and pelvic floor strength to improve continence.  Baseline:  Goal status: INITIAL  2.  Patient is able to sit on commode and urinate within 15 seconds due to the ability to relax her pelvic floor.  Baseline:  Goal status: INITIAL  3.  Patient is able to sit on the commode to have a bowel movement and not strain 50% of the time due to the ability to relax the pelvic floor.  Baseline:  Goal status: INITIAL  4.  Patient has increased in lower abdominal scar to reduce her lower abdominal pain </= 2/10.  Baseline:  Goal status: INITIAL  5.  Patient reports her urinary leakage decreased </= 70% due to the ability to fully relax the pelvic floor and contract and reduction of trigger points.  Baseline:  Goal status: INITIAL   PLAN:  PT FREQUENCY: 1-2x/week  PT DURATION: 12 weeks  PLANNED INTERVENTIONS: 97110-Therapeutic exercises, 97530- Therapeutic activity, [id number]- Neuromuscular re-education, 97535- Self Care, [id number]- Manual therapy, 20560 (1-2 muscles), 20561 (3+ muscles)- Dry Needling, Patient/Family education, Cryotherapy, Moist heat, and Biofeedback  PLAN FOR NEXT SESSION: manual  work to lower abdominal scar, diaphragmatic breathing, circular massage,    Sumeet Geter, PT 08/26/2024, 2:53 PM

## 2024-08-26 ENCOUNTER — Other Ambulatory Visit: Payer: Self-pay

## 2024-08-26 ENCOUNTER — Encounter: Attending: Obstetrics and Gynecology | Admitting: Physical Therapy

## 2024-08-26 DIAGNOSIS — R278 Other lack of coordination: Secondary | ICD-10-CM | POA: Diagnosis not present

## 2024-08-26 DIAGNOSIS — M6281 Muscle weakness (generalized): Secondary | ICD-10-CM | POA: Diagnosis not present

## 2024-08-26 DIAGNOSIS — R102 Pelvic and perineal pain: Secondary | ICD-10-CM | POA: Insufficient documentation

## 2024-08-26 DIAGNOSIS — L905 Scar conditions and fibrosis of skin: Secondary | ICD-10-CM | POA: Diagnosis not present

## 2024-08-26 NOTE — Patient Instructions (Signed)

## 2024-08-27 ENCOUNTER — Other Ambulatory Visit (HOSPITAL_COMMUNITY)

## 2024-08-27 ENCOUNTER — Other Ambulatory Visit (HOSPITAL_COMMUNITY)
Admission: RE | Admit: 2024-08-27 | Discharge: 2024-08-27 | Disposition: A | Discharge status: Home / Self Care | Payer: Self-pay | Source: Ambulatory Visit | Attending: Medical Genetics | Admitting: Medical Genetics

## 2024-08-28 ENCOUNTER — Other Ambulatory Visit (HOSPITAL_BASED_OUTPATIENT_CLINIC_OR_DEPARTMENT_OTHER)

## 2024-08-31 NOTE — Progress Notes (Unsigned)
 08/31/2024 Paige [name] [id number] 06-Sep-1966  Referring provider: Jefferey Fitch, MD Primary GI doctor: Dr. Federico  ( Dr. 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 03/22/24 EGD for dysphagia unremarkable esophagus status post empiric dilation, gastric polyp, healthy-appearing anastomosis normal jejunum.  Path showed reflux esophagitis negative EOE, negative H. pylori, benign polyp On omeprazole  BID, no reflux Was taking ibuprofen until a few weeks ago  Constipation/IBS with fecal incontinence rare BM once a week, increasing flatuance Occ fecal leakage and bowel incontinence She is on oxycodone  for back pain, 10 6 pills a day, spinal cord stimulator possible component of pelvic floor dysfunction with history and symptoms, follow up with urogyn June 16, consider pelvic floor PT/manometry Has failed linzess, dulcolax, enemas, mag citrate, amitiza  Failed Isbrela, could not get Motegrity , tried Movantik  Due for colon 10/2024  Screening colonoscopy 10/04/2021 with Dr. Aneita for personal history of adenomatous polyps family history of colon cancer showed 4 polyps ranging 7 to 10 mm sigmoid colon transverse descending, small lipoma transverse recall 3 years  Due 10/2024   Status post Roux-en-Y Follow up PCP  [facility] On Bipap since may 2024  Patient Care Team: Jefferey Fitch, MD as PCP - General (Internal Medicine) Skeet Juliene SAUNDERS, DO as Consulting Physician (Neurology)  HISTORY OF PRESENT ILLNESS: 58 y.o. female with a past medical history of Roux-en-Y, constipation/IBS, IDA, GERD and others listed below presents for evaluation of  Constipation and dysphagia.   Patient was last seen in the office by myself on 02/16/2024 for dysphagia and constipation.  Discussed the use of AI scribe software for clinical note transcription with the patient, who gave verbal consent to proceed.  History of Present Illness            She  reports that she has never smoked. She has never used smokeless tobacco. She reports that she does not currently use alcohol . She reports that she does not use drugs.  RELEVANT GI HISTORY, IMAGING AND LABS:  CBC    Component Value Date/Time   WBC 7.0 05/25/2024 1214   WBC 6.9 05/26/2019 1046   RBC 4.39 05/25/2024 1214   RBC 4.42 05/26/2019 1046   HGB 13.4 05/25/2024 1214   HCT 42.3 05/25/2024 1214   PLT 213 05/25/2024 1214   MCV 96 05/25/2024 1214   MCH 30.5 05/25/2024 1214   MCHC 31.7 05/25/2024 1214   MCHC 32.5 05/26/2019 1046   RDW 12.9 05/25/2024 1214   LYMPHSABS 2.2 05/25/2024 1214   MONOABS 0.6 05/26/2019 1046   EOSABS 0.1 05/25/2024 1214   BASOSABS 0.1 05/25/2024 1214   Recent Labs    05/25/24 1214  HGB 13.4    CMP     Component Value Date/Time   NA 141 05/25/2024 1214   K 4.2 05/25/2024 1214   CL 99 05/25/2024 1214   CO2 27 05/25/2024 1214   GLUCOSE 95 05/25/2024 1214   GLUCOSE 97 05/26/2019 1046   BUN 16 05/25/2024 1214   CREATININE 0.86 05/25/2024 1214   CALCIUM 9.2 05/25/2024 1214   PROT 6.3 05/25/2024 1214   ALBUMIN 4.2 05/25/2024 1214   AST 32 05/25/2024 1214   ALT 38 (H) 05/25/2024 1214   ALKPHOS 103 05/25/2024 1214  BILITOT 0.3 05/25/2024 1214      Latest Ref Rng & Units 05/25/2024   12:14 PM 05/26/2019   10:46 AM 05/12/2017   12:22 PM  Hepatic Function  Total Protein 6.0 - 8.5 g/dL 6.3  6.9  6.5   Albumin 3.8 - 4.9 g/dL 4.2  4.2  4.2   AST 0 - 40 IU/L 32  36  34   ALT 0 - 32 IU/L 38  30  49   Alk Phosphatase 44 - 121 IU/L 103  98  63   Total Bilirubin 0.0 - 1.2 mg/dL 0.3  0.4  0.3   Bilirubin, Direct 0.0 - 0.3 mg/dL   0.1       Current  Medications:   Current Outpatient Medications (Endocrine & Metabolic):    estradiol  (ESTRACE ) 0.5 MG tablet, Take 0.5 mg by mouth daily.   levothyroxine  (SYNTHROID , LEVOTHROID) 88 MCG tablet, Take 88 mcg by mouth daily before breakfast.   liothyronine (CYTOMEL) 5 MCG tablet, Take 5 mcg by mouth daily.   metFORMIN  (GLUCOPHAGE ) 500 MG tablet, 1 po with lunch daily   progesterone  (PROMETRIUM ) 100 MG capsule, Take 100 mg by mouth daily.  Current Outpatient Medications (Cardiovascular):    torsemide (DEMADEX) 20 MG tablet, Take 20 mg by mouth 2 (two) times daily.  Current Outpatient Medications (Respiratory):    Albuterol-Budesonide (AIRSUPRA ) 90-80 MCG/ACT AERO, Inhale 1 each into the lungs 2 (two) times daily.   Albuterol-Budesonide (AIRSUPRA ) 90-80 MCG/ACT AERO, Inhale 1 each into the lungs 2 (two) times daily.   fluticasone (FLONASE SENSIMIST) 27.5 MCG/SPRAY nasal spray, 2 sprays (1 spray in each nostril) Nasally Once a day during seasons of difficulty for 30 days   ipratropium (ATROVENT) 0.06 % nasal spray, Place 1 spray into both nostrils daily.   promethazine  (PHENERGAN ) 12.5 MG tablet, Take 1 tablet (12.5 mg total) by mouth every 8 (eight) hours as needed for nausea or vomiting.  Current Outpatient Medications (Analgesics):    Erenumab -aooe (AIMOVIG ) 70 MG/ML SOAJ, INJECT 70 MG INTO THE SKIN EVERY 28 DAYS.   ibuprofen (ADVIL) 800 MG tablet, Take 800 mg by mouth 3 (three) times daily.   Oxycodone  HCl 10 MG TABS, Take 10 mg by mouth every 6 (six) hours as needed.   Ubrogepant  (UBRELVY ) 100 MG TABS, Take 1 tablet (100 mg total) by mouth daily as needed (at onset of headaches may repeat dose after 2 hours as needed max 200 mg in 24 hours).  Current Outpatient Medications (Hematological):    Fe Fum-Fe Poly-Vit C-Lactobac (FUSION) 65-65-25-30 MG CAPS, Take by mouth.  Current Outpatient Medications (Other):    buPROPion  (WELLBUTRIN  XL) 150 MG 24 hr tablet, TAKE 3 TABLETS EVERY MORNING  (Patient taking differently: TAKE 1 TABLET EVERY MORNING)   cariprazine  (VRAYLAR ) 3 MG capsule, Take 1 capsule (3 mg total) by mouth daily.   clonazePAM  (KLONOPIN ) 0.5 MG tablet, Take 1 tablet (0.5 mg total) by mouth 2 (two) times daily.   diazepam  (VALIUM ) 5 MG tablet, Place 1 tablet vaginally nightly as needed for muscle spasm/ pelvic pain.   docusate sodium  (COLACE) 100 MG capsule, Take 100 mg by mouth 2 (two) times daily.   DULoxetine  (CYMBALTA ) 60 MG capsule, Take 1 capsule (60 mg total) by mouth 2 (two) times daily.   estradiol  (ESTRACE ) 0.1 MG/GM vaginal cream, Place 0.5 g vaginally 2 (two) times a week. Place 0.5g nightly for two weeks then twice a week after   Magnesium Citrate (MAGNESIUM GUMMIES PO), Take 125 mg  by mouth 2 (two) times daily.   Multiple Vitamins-Minerals (MULTIVITAMIN GUMMIES ADULT PO), Take 1 each by mouth daily.   nystatin cream (MYCOSTATIN), Apply 1 application  topically daily as needed for dry skin.   Olopatadine HCl 0.2 % SOLN, 1 drop into affected eye Ophthalmic Once a day or prn for 30 days   omeprazole  (PRILOSEC) 20 MG capsule, Take 20 mg by mouth 2 (two) times daily before a meal.   ondansetron  (ZOFRAN -ODT) 4 MG disintegrating tablet, Take 1 tablet (4 mg total) by mouth every 8 (eight) hours as needed for nausea or vomiting.   potassium chloride  (K-DUR,KLOR-CON ) 10 MEQ tablet, Take 1 tablet by mouth daily.   Propylene Glycol (SYSTANE COMPLETE) 0.6 % SOLN, Apply 2 drops to eye.   Prucalopride Succinate  (MOTEGRITY ) 2 MG TABS, Take 1 tablet (2 mg total) by mouth daily. (Patient not taking: Reported on 08/19/2024)   rOPINIRole  (REQUIP ) 2 MG tablet, Take 2 mg by mouth 2 (two) times daily.   Tenapanor HCl (IBSRELA ) 50 MG TABS, Take 50 mg by mouth 2 (two) times daily before a meal.   tiZANidine (ZANAFLEX) 4 MG tablet, Take 4 mg by mouth 2 (two) times daily.   traZODone  (DESYREL ) 100 MG tablet, Take 2 tablets (200 mg total) by mouth at bedtime.   Vitamin D -Vitamin K  (VITAMIN K2-VITAMIN D3 PO), Take 1 each by mouth daily. Vitamin K2 100 mcg/Vitamin D  50 mcg per gummie  Medical History:  Past Medical History:  Diagnosis Date   Allergic rhinitis    Allergy    Anemia    Anxiety disorder    Back pain    Bilateral swelling of feet    Bronchitis    Cancer (HCC)    skin   Chronic fatigue    Chronic fatigue    Chronic headaches    Chronic pain    Constipation    Depression    Endometriosis    Family history of colon cancer    mother,uncles,aunts   Fibromyalgia    Fibromyalgia    Gallbladder problem    GERD (gastroesophageal reflux disease)    Hyperlipidemia    Hypothyroidism    IBS (irritable bowel syndrome)    Insomnia    Iron  deficiency    Joint pain    Laryngopharyngeal reflux (LPR)    Obesity    Osteoarthritis    Personal history of colonic polyps 04/2006   polypoid   PONV (postoperative nausea and vomiting)    RLS (restless legs syndrome)    Sleep apnea    no cpap   SOB (shortness of breath)    Swelling    Vitamin B12 deficiency    Vitamin D  deficiency    Allergies:  Allergies  Allergen Reactions   Tape Dermatitis and Itching    BURNING AND BLISTERING - USUALLY DOES OK WITH TEGADERM  Paper tape or Coban OK   Topiramate  Other (See Comments) and Shortness Of Breath    Really wierd feeling  Off balance; cloudy thinking  Really wierd feeling Off balance; cloudy thinking    Off balance; cloudy thinking   Cyclobenzaprine Other (See Comments)    Other reaction(s): Unknown Hallucinations Unknown    Flexeril [Cyclobenzaprine Hcl] Other (See Comments)    hallucinations   Meloxicam Other (See Comments)   Naltrexone Other (See Comments)    SEVERE PAIN ALL OVER   Pork (Porcine) Protein Other (See Comments)    Other reaction(s): Other (See Comments) Migraines Other reaction(s): Other (See Comments) Migraines  Sumatriptan Other (See Comments)    Difficulty breathing Other reaction(s): Other (see comments) Off  balance    Wound Dressing Adhesive Other (See Comments)    Tape - Burns and blisters; caused skin infection   Brexpiprazole Other (See Comments)    Rexulti     Restless arms   Glycopyrrolate  Other (See Comments)    Urinary retention   Imitrex [Sumatriptan Base] Other (See Comments)    Pt unsure of allergic reaction/intolerance   Lamotrigine  Other (See Comments)    Pt unsure of allergy/intolerance   Sulfa Antibiotics Other (See Comments)    Pt states she does not think she is allergic to Sulfa      Surgical History:  She  has a past surgical history that includes Roux-en-Y Gastric Bypass (04/2004); Appendectomy (1993); Cholecystectomy (1999); Repair ankle ligament (Left, 2001); Anterior cruciate ligament repair (Left, 2003); Colonoscopy (2009); Carpal tunnel release (Left, 09/25/2017); Tonsillectomy and adenoidectomy (1976); Carpal tunnel release (Right); Polypectomy; Back surgery (03/2024); Lumbar laminectomy; Panniculectomy; Trigger finger release; and De Quervain's release. Family History:  Her family history includes Cancer in her father and mother; Colon cancer in her cousin, maternal aunt, maternal uncle, and mother; Colon polyps in her cousin and sister; Depression in her mother; Diabetes in her father and sister; Heart disease in her father and mother; Hyperlipidemia in her mother; Hypertension in her mother; Leukemia in her father; Lymphoma in her father; Obesity in her mother; Skin cancer in her father; Sleep apnea in her father.  REVIEW OF SYSTEMS  : All other systems reviewed and negative except where noted in the History of Present Illness.  PHYSICAL EXAM: There were no vitals taken for this visit. Physical Exam   GENERAL APPEARANCE: Obese, in no apparent distress. HEENT: No cervical lymphadenopathy, unremarkable thyroid , sclerae anicteric, conjunctiva pink. RESPIRATORY: Respiratory effort normal, breath sounds equal bilaterally without rales, rhonchi, or wheezing. CARDIO:  Regular rate and rhythm with no murmurs, rubs, or gallops, peripheral pulses intact. ABDOMEN: Soft, non-distended, active bowel sounds in all four quadrants, mild tenderness over entire abdomen with increased tenderness in the epigastric region, no rebound, no mass appreciated. RECTAL: Declines. MUSCULOSKELETAL: Full range of motion, normal gait, without edema. SKIN: Dry, intact without rashes or lesions. No jaundice. NEURO: Alert, oriented, no focal deficits. PSYCH: Cooperative, normal mood and affect.      Alan JONELLE Coombs, PA-C 7:55 AM

## 2024-09-01 ENCOUNTER — Ambulatory Visit: Admitting: Physician Assistant

## 2024-09-01 ENCOUNTER — Other Ambulatory Visit: Payer: Self-pay | Admitting: Physician Assistant

## 2024-09-01 ENCOUNTER — Encounter: Payer: Self-pay | Admitting: Physician Assistant

## 2024-09-01 VITALS — BP 116/80 | HR 86 | Ht 67.0 in | Wt 230.2 lb

## 2024-09-01 DIAGNOSIS — G4733 Obstructive sleep apnea (adult) (pediatric): Secondary | ICD-10-CM

## 2024-09-01 DIAGNOSIS — K5903 Drug induced constipation: Secondary | ICD-10-CM

## 2024-09-01 DIAGNOSIS — R1310 Dysphagia, unspecified: Secondary | ICD-10-CM

## 2024-09-01 DIAGNOSIS — K581 Irritable bowel syndrome with constipation: Secondary | ICD-10-CM

## 2024-09-01 DIAGNOSIS — E669 Obesity, unspecified: Secondary | ICD-10-CM

## 2024-09-01 DIAGNOSIS — Z860101 Personal history of adenomatous and serrated colon polyps: Secondary | ICD-10-CM

## 2024-09-01 DIAGNOSIS — R1319 Other dysphagia: Secondary | ICD-10-CM

## 2024-09-01 DIAGNOSIS — Z6836 Body mass index (BMI) 36.0-36.9, adult: Secondary | ICD-10-CM

## 2024-09-01 DIAGNOSIS — Z9884 Bariatric surgery status: Secondary | ICD-10-CM

## 2024-09-01 MED ORDER — LINACLOTIDE 290 MCG PO CAPS: 290.0000 ug | ORAL_CAPSULE | Freq: Every day | ORAL | 0 refills | Status: AC

## 2024-09-01 MED ORDER — LACTULOSE 10 GM/15ML PO SOLN: ORAL | 1 refills | Status: DC

## 2024-09-01 MED ORDER — NA SULFATE-K SULFATE-MG SULF 17.5-3.13-1.6 GM/177ML PO SOLN
1.0000 | Freq: Once | ORAL | 0 refills | Status: AC
Start: 2024-09-01 — End: 2024-09-01

## 2024-09-01 NOTE — Patient Instructions (Addendum)
 Behavioral and Dietary Strategies for Management of Esophageal Dysmotility/dysphagia 1. Take reflux medications 30+ minutes before food in the morning.  2. Begin meals with warm beverage 3. Eat smaller more frequent meals 4. Eat slowly, taking small bites and sips 5. Alternate solids and liquids 6. Avoid foods/liquids that increase acid production 7. Sit upright during and for 30+ minutes after meals to facilitate esophageal clearing 8. Can try altoid melting in mouth before food  VISIT SUMMARY:  During today's visit, we discussed your ongoing swallowing difficulties and severe constipation. We reviewed your history of Roux-en-Y gastric bypass and the treatments you have tried so far. We also talked about your current medications and their effects on your symptoms.  YOUR PLAN:  -CHRONIC CONSTIPATION WITH PELVIC FLOOR DYSFUNCTION AND OPIOID-INDUCED GASTROINTESTINAL DYSFUNCTION: Chronic constipation can be caused by multiple factors, including the use of opioid medications and issues with the pelvic floor muscles. We will schedule a colonoscopy to check for any underlying issues. You should start taking Linzess 290 mcg once daily before breakfast and lactulose 15-30 mL up to three times daily. Continue using enemas and Senokot as needed. We will also consult with pain management to explore ways to reduce your opioid use. If the colonoscopy does not show any issues, we may consider a test called anal rectal manometry to further evaluate your condition.  -DYSPHAGIA AFTER ROUX-EN-Y GASTRIC BYPASS: Dysphagia means difficulty swallowing, which you have been experiencing since your gastric bypass surgery. To better understand your swallowing difficulties, we will order an esophageal manometry test to assess the movement of your esophagus. In the meantime, try using Altoids before meals, sit upright, and drink warm beverages before eating. Continue taking omeprazole  as prescribed.  -GASTROESOPHAGEAL  REFLUX DISEASE (GERD): GERD is a condition where stomach acid frequently flows back into the tube connecting your mouth and stomach, causing heartburn. Your GERD is currently well-managed with omeprazole , and you do not have heartburn symptoms. Continue taking omeprazole  as prescribed. We will also discuss alternative pain management options with your primary care doctor to avoid using NSAIDs, which can worsen GERD.  INSTRUCTIONS:  Please schedule a colonoscopy and an esophageal manometry test as soon as possible. Continue with the prescribed medications and follow the recommendations provided. We will also arrange a consultation with pain management to discuss reducing your opioid use. Follow up with your primary care doctor to discuss alternative pain management options.  Gastroparesis Gastroparesis is a condition in which food takes longer than normal to empty from the stomach.  This condition is also known as delayed gastric emptying. It is usually a long-term (chronic) condition.  What are the signs or symptoms? Symptoms of this condition include: Feeling full after eating very little or a loss of appetite. Nausea, vomiting, or heartburn. Bloating of your abdomen. Inconsistent blood sugar (glucose) levels on blood tests. Unexplained weight loss. Acid from the stomach coming up into the esophagus (gastroesophageal reflux). Sudden tightening (spasm) of the stomach, which can be painful. Symptoms may come and go. Some people may not notice any symptoms.  What increases the risk? You are more likely to develop this condition if: You have certain disorders or diseases. These may include: An endocrine disorder. An eating disorder. Amyloidosis. Scleroderma. Parkinson's disease. Multiple sclerosis. Cancer or infection of the stomach or the vagus nerve. You have had surgery on your stomach or vagus nerve. You take certain medicines. You are female.  Things you can do: Please do small  frequent meals like 4-6 meals a day.  Eat and drink liquids at separate times.  Avoid high fiber foods, cook your vegetables, avoid high fat food.  Suggest spreading protein throughout the day (greek yogurt, glucerna, soft meat, milk, eggs) Choose soft foods that you can mash with a fork When you are more symptomatic, change to pureed foods foods and liquids.  Consider reading Living well with Gastroparesis by [facility] Check out this link to a diet online [email or web address].GroupJournal.fr    No aleve , ibuprofen, goody powders, as these are antiinflammatories and can cause inflammation in your stomach, increase bleeding risk and cause ulcers.  You can talk with PCP about alternative pain options.  Can do tyelnol max 3000 mg a day, salon pas patches are over the counter and voltern gel is topical antiinflammatory that is safe.   Due to recent changes in healthcare laws, you may see the results of your imaging and laboratory studies on MyChart before your provider has had a chance to review them.  We understand that in some cases there may be results that are confusing or concerning to you. Not all laboratory results come back in the same time frame and the provider may be waiting for multiple results in order to interpret others.  Please give us  48 hours in order for your provider to thoroughly review all the results before contacting the office for clarification of your results.     I appreciate the  opportunity to care for you  Thank You   [facility]

## 2024-09-02 ENCOUNTER — Encounter: Attending: Obstetrics and Gynecology | Admitting: Physical Therapy

## 2024-09-02 ENCOUNTER — Encounter: Payer: Self-pay | Admitting: Physical Therapy

## 2024-09-02 DIAGNOSIS — R278 Other lack of coordination: Secondary | ICD-10-CM | POA: Insufficient documentation

## 2024-09-02 DIAGNOSIS — M6281 Muscle weakness (generalized): Secondary | ICD-10-CM | POA: Insufficient documentation

## 2024-09-02 DIAGNOSIS — L905 Scar conditions and fibrosis of skin: Secondary | ICD-10-CM | POA: Diagnosis not present

## 2024-09-02 DIAGNOSIS — R102 Pelvic and perineal pain unspecified side: Secondary | ICD-10-CM | POA: Insufficient documentation

## 2024-09-02 NOTE — Therapy (Signed)
 OUTPATIENT PHYSICAL THERAPY FEMALE PELVIC TREATMENT   Patient Name: Paige [name] MRN: [id number] DOB:Nov 13, 1966, 58 y.o., female Today's Date: 09/02/2024  END OF SESSION:  PT End of Session - 09/02/24 1409     Visit Number 2    Date for Recertification  11/18/24    Authorization Type [employer] medicare    Authorization - Visit Number 2    Authorization - Number of Visits 10    PT Start Time 1400    PT Stop Time 1445    PT Time Calculation (min) 45 min    Activity Tolerance Patient tolerated treatment well    Behavior During Therapy WFL for tasks assessed/performed          Past Medical History:  Diagnosis Date   Allergic rhinitis    Allergy    Anemia    Anxiety disorder    Back pain    Bilateral swelling of feet    Bronchitis    Cancer (HCC)    skin   Chronic fatigue    Chronic fatigue    Chronic headaches    Chronic pain    Constipation    Depression    Endometriosis    Family history of colon cancer    mother,uncles,aunts   Fibromyalgia    Fibromyalgia    Gallbladder problem    GERD (gastroesophageal reflux disease)    Hyperlipidemia    Hypothyroidism    IBS (irritable bowel syndrome)    Insomnia    Iron  deficiency    Joint pain    Laryngopharyngeal reflux (LPR)    Obesity    Osteoarthritis    Personal history of colonic polyps 04/2006   polypoid   PONV (postoperative nausea and vomiting)    RLS (restless legs syndrome)    Sleep apnea    no cpap   SOB (shortness of breath)    Swelling    Vitamin B12 deficiency    Vitamin D  deficiency    Past Surgical History:  Procedure Laterality Date   ANTERIOR CRUCIATE LIGAMENT REPAIR Left 2003   APPENDECTOMY  1993   BACK SURGERY  03/2024   spinal cord stimulator placed   CARPAL TUNNEL RELEASE Left 09/25/2017   Procedure: LEFT CARPAL TUNNEL RELEASE;  Surgeon: Murrell Kuba, MD;  Location: [facility];  Service: Orthopedics;  Laterality: Left;   CARPAL TUNNEL RELEASE Right     CHOLECYSTECTOMY  1999   COLONOSCOPY  2009   [facility]   DE QUERVAIN'S RELEASE     LUMBAR LAMINECTOMY     L4- S1   PANNICULECTOMY     POLYPECTOMY     REPAIR ANKLE LIGAMENT Left 2001   ROUX-EN-Y GASTRIC BYPASS  04/2004   Dr Donnice Lunger   TONSILLECTOMY AND ADENOIDECTOMY  1976   TRIGGER FINGER RELEASE     Patient Active Problem List   Diagnosis Date Noted   High-tone pelvic floor dysfunction 05/18/2024   SUI (stress urinary incontinence, female) 05/18/2024   Overactive bladder 05/18/2024   Incontinence of feces 05/18/2024   Vaginal atrophy 05/18/2024   Chronic idiopathic constipation 05/18/2024   Nutritional deficiency 03/17/2024   OSA on CPAP 03/17/2024   Encounter for orthopedic follow-up care 01/31/2020   Pain of left hand 12/19/2018   Radial styloid tenosynovitis 12/19/2018   Trigger finger 12/19/2018   Abscess of postoperative wound of abdominal wall 10/05/2018   Pannus, abdominal 09/21/2018   Pigmented skin lesion 06/23/2018   Cervicalgia 02/12/2018   Bilateral carpal tunnel syndrome 08/13/2017  Primary osteoarthritis of both first carpometacarpal joints 08/13/2017   Muscle tension dysphonia 08/12/2016   Vocal cord atrophy 08/12/2016   Hoarseness 07/02/2016   Laryngopharyngeal reflux (LPR) 07/02/2016   Cough 05/23/2016   Dyspnea 05/23/2016   Chronic pain 05/22/2016   Sacrococcygeal disorders, not elsewhere classified 11/24/2015   Anemia, iron  deficiency 08/24/2015   Fibrositis 08/17/2015   Abnormal weight gain 08/17/2015   Anxiety 08/17/2015   Recurrent major depressive episodes 08/17/2015   History of migraine headaches 08/17/2015   Neurosis, posttraumatic 08/17/2015   Bariatric surgery status 08/17/2015   DDD (degenerative disc disease), lumbar 01/18/2015   Facet joint syndrome 01/18/2015   Thoracic spondylosis 01/18/2015   Myofascial pain 02/22/2013   Back ache 07/09/2012   Family history of malignant neoplasm of gastrointestinal tract 06/10/2011    History of colonic polyps 06/10/2011   Esophageal reflux 06/10/2011   Cellulitis 06/10/2011   History of Roux-en-Y gastric bypass 06/10/2011   B12 nutritional deficiency 06/10/2011   Morbid obesity due to excess calories (HCC) 06/10/2011   Personal history of fibromyalgia 06/10/2011   Recurrent major depressive disorder, in partial remission 06/10/2011   Acid reflux 06/10/2011    PCP: Viona Fitch, MD  REFERRING PROVIDER: Marilynne Rosaline SAILOR, MD   REFERRING DIAG:  M62.89 (ICD-10-CM) - High-tone pelvic floor dysfunction  N39.3 (ICD-10-CM) - SUI (stress urinary incontinence, female)  N32.81 (ICD-10-CM) - Overactive bladder  R15.9 (ICD-10-CM) - Incontinence of feces, unspecified fecal incontinence type    THERAPY DIAG:  Muscle weakness (generalized)  Other lack of coordination  Scar  Pelvic pain  Rationale for Evaluation and Treatment: Rehabilitation  ONSET DATE: 12/2023  SUBJECTIVE:                                                                                                                                                                                           SUBJECTIVE STATEMENT: I started Linzess today. I have some stool leakage today. I am getting a colonoscopy.   Fluid intake:  100oz water, 1 small bottle diet dr pepper per day. Has an occasional coffee. Usually only has sips of water before bed.   FUNCTIONAL LIMITATIONS: Patient uses a stick for walking  PERTINENT HISTORY:  Surgeries: Cholecystectomy; appendectomy; back surgery with spinal cored stimulator; Roux-EN-Y gastric bypass Other: Skin Caner; Fibromyalgia; IBS; Hypothyroidism;    PAIN:  Are you having pain? Yes NPRS scale: 5/10 Pain location: lower abdominal area  Pain type: aching Pain description: constant   Aggravating factors: needing to have a bowel movement and not able to Relieving factors: heat at one tim  PRECAUTIONS: Other: skin cancer; spinal cord stimulator  RED  FLAGS: None   WEIGHT BEARING RESTRICTIONS: No  FALLS:  Has patient fallen in last 6 months? No  OCCUPATION: disability  ACTIVITY LEVEL : Patient has stopped exercising since 9/1 due to back pain  PLOF: Independent  PATIENT GOALS: be able to urinate better, not have to use an enema to have a bowel movement, reduce pelvic pain   BOWEL MOVEMENT: Pain with bowel movement: Yes, 8-9/10 Type of bowel movement:0-4 time(s) per week ; strains; Type 1, hard rocks Fully empty rectum: No Leakage: Consistency with leakage: soft                                                      Caused by: small amounts weekly, usually after an incomplete BM  Pads: Yes: depends at night Fiber supplement/laxative stool softener (dulcolax 5mg  BID), Ibrelsa BID   URINATION: Pain with urination: Yes, burning of retention Fully empty bladder: Nonot able to urinate right away, sometimes needs to run water; she will wipe then urinate again                          Post-void dribble: No Stream: Weak Urgency: No Frequency:during the day  8+                                                          Nocturia: Yes:  2-6 times per night to void    Leakage: Coughing, Sneezing, Laughing, and without sensation Pads/briefs:  0-2 pads per day ; depends  at night  INTERCOURSE:  Ability to have vaginal penetration Yes  Pain with intercourse: Initial Penetration and Deep Penetration Dryness: Yes    PROLAPSE: None   OBJECTIVE:  Note: Objective measures were completed at Evaluation unless otherwise noted.  DIAGNOSTIC FINDINGS:  Pelvic floor strength I/V, puborectalis I/V external anal sphincter I/V   Pelvic floor musculature: Right levator tender, Right obturator tender, Left levator tender, Left obturator tender. High tone throughout Post Void Residual 10 mL      COGNITION: Overall cognitive status: Within functional limits for tasks assessed     SENSATION: Light touch: Appears intact   GAIT: Assistive  device utilized: walking stick  POSTURE: rounded shoulders, forward head, and decreased lumbar lordosis; Right shoulder higher; patient had scoliosos   LUMBARAROM/PROM:not tested due to fusion    LOWER EXTREMITY MNF:qloo bilateral hip ROM   LOWER EXTREMITY MMT:  MMT Right eval Left eval  Hip flexion 4/5 4/5  Hip extension 4/5 4/5  Hip abduction 3/5 3/5   (Blank rows = not tested) PALPATION:  Abdominal: tenderness of the lower abdomen; increased lower rib cage angle  Diastasis: No Distortion: No  Breathing: contract the abdominals and will raise the lower rib cage Scar tissue: Yes: along the lower abdomen, lower umbilicus,                 External Perineal Exam: redness along the posterior introitus, tenderness located on the perineal body and around the rectum  Internal Pelvic Floor: therapist was not able to get the index finger past the DIP joint into the vaginal canal due to pain; going through the rectum the puborectalis did not move forward well, tenderness throughout the levator ani and obturator internist and in the rectum.   Patient confirms identification and approves PT to assess internal pelvic floor and treatment Yes No emotional/communication barriers or cognitive limitation. Patient is motivated to learn. Patient understands and agrees with treatment goals and plan. PT explains patient will be examined in standing, sitting, and lying down to see how their muscles and joints work. When they are ready, they will be asked to remove their underwear so PT can examine their perineum. The patient is also given the option of providing their own chaperone as one is not provided in our facility. The patient also has the right and is explained the right to defer or refuse any part of the evaluation or treatment including the internal exam. With the patient's consent, PT will use one gloved finger to gently assess the muscles of the pelvic floor, seeing  how well it contracts and relaxes and if there is muscle symmetry. After, the patient will get dressed and PT and patient will discuss exam findings and plan of care. PT and patient discuss plan of care, schedule, attendance policy and HEP activities.   PELVIC MMT:   MMT eval  Vaginal 2/5  Internal Anal Sphincter 2/5  External Anal Sphincter 2/5  Puborectalis 2/5  (Blank rows = not tested)        TONE: Increased tone of the pelvic floor  PROLAPSE: none  TODAY'S TREATMENT:     09/02/24 Manual: Soft tissue mobilization: Manual work to the diaphragm to assist with diaphragmatic breathing Circular massage of the abdomen to promote peristalic motion Scar tissue mobilization: Scar mobilization going through the restrictions working in different directions to improve mobility Using a suction cup to lift the scar up and improve movement Myofascial release: Tissue rolling of the abdomen and around the ribs to increase tissue mobility Neuromuscular re-education: Down training: Diaphragmatic breathing with opening the rib cage then having the air travel to the abdomen Self-care: Educated patient and her partner on how to perform scar massage to her lower abdomen and the verbally understand                                                                                                                             DATE: 08/26/24  EVAL Examination completed, findings reviewed, pt educated on POC, HEP, and female pelvic floor anatomy, reasoning with pelvic floor assessment internally with pt consent, and abdominal massage. Pt motivated to participate in PT and agreeable to attempt recommendations.     PATIENT EDUCATION:  09/02/24 Education details: Access Code: [email or web address], scar massage Person educated: Patient Education method: Explanation, Demonstration, Tactile cues, Verbal cues, and Handouts Education comprehension: verbalized understanding, returned demonstration, verbal cues required,  tactile cues required, and needs further education  HOME EXERCISE PROGRAM: 09/02/24 Access Code: [email or web address] URL: [email or web address] Date: 08/26/2024 Prepared by: Channing Pereyra  Exercises - Supine Diaphragmatic Breathing  - 2 x daily - 7 x weekly - 1 sets - 10 reps - Seated Diaphragmatic Breathing  - 2 x daily - 7 x weekly - 1 sets - 10 reps  Patient Education - Abdominal Massage for Constipation - Abdominal Massage for Constipation  ASSESSMENT:  CLINICAL IMPRESSION: Patient is a 58 y.o. female who was seen today for physical therapy  treatment for high toned pelvic floor, urinary and fecal incontinence. Patient had increased mobility of her scar and diaphragm. She was able to perform diaphragmatic breathing correctly. Patient was able to stand straighter after therapy. She had a better urine flow after therapy. Patient will benefit from skilled therapy to improve pelvic floor muscle excursion, reduce pain and coordination to help with continence and toileting.   OBJECTIVE IMPAIRMENTS: decreased activity tolerance, decreased coordination, decreased strength, increased fascial restrictions, increased muscle spasms, and pain.   ACTIVITY LIMITATIONS: sleeping, continence, and toileting  PARTICIPATION LIMITATIONS: meal prep, cleaning, laundry, interpersonal relationship, shopping, and community activity  PERSONAL FACTORS: Cholecystectomy; appendectomy; back surgery with spinal cored stimulator; Roux-EN-Y gastric bypass are also affecting patient's functional outcome.   REHAB POTENTIAL: Excellent  CLINICAL DECISION MAKING: Unstable/unpredictable  EVALUATION COMPLEXITY: High   GOALS: Goals reviewed with patient? Yes  SHORT TERM GOALS: Target date: 09/23/24  Patient educated on abdominal massage to assist with peristalic motion of the intestines.  Baseline: Goal status: Met 09/02/24  2.  Patient educated on scar mobilization to improve mobility and reduce pain.   Baseline:  Goal status: Met 09/02/24  3.  Patient is able to perform diaphragmatic breathing to lengthen the pelvic floor to urinate and have a bowel movement.  Baseline:  Goal status: INITIAL  4.  Patient educated on correct ways to have a bowel movement to reduce straining.  Baseline:  Goal status: INITIAL  LONG TERM GOALS: Target date: 11/18/24  Patient independent with advanced HEP for core and pelvic floor strength to improve continence.  Baseline:  Goal status: INITIAL  2.  Patient is able to sit on commode and urinate within 15 seconds due to the ability to relax her pelvic floor.  Baseline:  Goal status: INITIAL  3.  Patient is able to sit on the commode to have a bowel movement and not strain 50% of the time due to the ability to relax the pelvic floor.  Baseline:  Goal status: INITIAL  4.  Patient has increased in lower abdominal scar to reduce her lower abdominal pain </= 2/10.  Baseline:  Goal status: INITIAL  5.  Patient reports her urinary leakage decreased </= 70% due to the ability to fully relax the pelvic floor and contract and reduction of trigger points.  Baseline:  Goal status: INITIAL   PLAN:  PT FREQUENCY: 1-2x/week  PT DURATION: 12 weeks  PLANNED INTERVENTIONS: 97110-Therapeutic exercises, 97530- Therapeutic activity, 97112- Neuromuscular re-education, 97535- Self Care, [id number]- Manual therapy, 20560 (1-2 muscles), 20561 (3+ muscles)- Dry Needling, Patient/Family education, Cryotherapy, Moist heat, and Biofeedback  PLAN FOR NEXT SESSION: manual work to lower abdominal scar, See if Linzess is helping, review toileting, manual work on the outside of the vagina   Channing Pereyra, PT 09/02/24 2:56 PM

## 2024-09-06 ENCOUNTER — Encounter: Payer: Self-pay | Admitting: Adult Health

## 2024-09-06 ENCOUNTER — Telehealth: Admitting: Adult Health

## 2024-09-06 DIAGNOSIS — F411 Generalized anxiety disorder: Secondary | ICD-10-CM

## 2024-09-06 DIAGNOSIS — F3341 Major depressive disorder, recurrent, in partial remission: Secondary | ICD-10-CM

## 2024-09-06 DIAGNOSIS — G4700 Insomnia, unspecified: Secondary | ICD-10-CM

## 2024-09-06 DIAGNOSIS — F4310 Post-traumatic stress disorder, unspecified: Secondary | ICD-10-CM

## 2024-09-06 MED ORDER — CLONAZEPAM 0.5 MG PO TABS: 0.5000 mg | ORAL_TABLET | Freq: Two times a day (BID) | ORAL | 2 refills | Status: DC

## 2024-09-06 NOTE — Progress Notes (Signed)
 Paige [name] [id number] [birth year] 58 y.o.  Virtual Visit via Video Note  I connected with pt @ on 09/06/24 at  1:30 PM EDT by a video enabled telemedicine application and verified that I am speaking with the correct person using two identifiers.   I discussed the limitations of evaluation and management by telemedicine and the availability of in person appointments. The patient expressed understanding and agreed to proceed.  I discussed the assessment and treatment plan with the patient. The patient was provided an opportunity to ask questions and all were answered. The patient agreed with the plan and demonstrated an understanding of the instructions.   The patient was advised to call back or seek an in-person evaluation if the symptoms worsen or if the condition fails to improve as anticipated.  I provided 25 minutes of non-face-to-face time during this encounter.  The patient was located at home.  The provider was located at [facility].   Angeline LOISE Sayers, NP   Subjective:   Patient ID:  Paige [name] is a 58 y.o. (DOB 11/23/1966) female.  Chief Complaint: No chief complaint on file.   HPI Paige [name] presents for follow-up of GAD, MDD, PTSD and insomnia.   Describes mood today as ok. Pleasant. Reports tearfulness - pain. Mood symptoms - reports some irritability and anxiety. Denies depression. Reports lower interest and motivation - but is doing things. Reports chronic pain issues. Reports a pinched nerve in back - woke up like that on September 1st - still having issues. Denies panic attacks. Reports some worry, rumination and over thinking just the usual. Reports mood as variable - feeling more up. Stating I'm hanging in there, I had a rough patch, but made it through it. Reports taking medications as prescribed. Continues to see therapist every 4 weeks - Bambi Cottle.  Energy levels improved. Active, does not have a regular exercise  routine - working with P/T.  Enjoys some usual interests and activities. Lives with partner and 4 cats inside and 6 outside. Mother local and supportive. Attends church. Appetite adequate. Weight loss 30 pounds - 228 pounds- 68.5. Working with [employer] [facility]. Reports sleeping better some nights than others. Reports an average of 6 to 7 hours. Diagnosed with sleep apnea - using BiPAP machine. Denies daytime napping. Reports improved focus and concentration. Completing tasks around the house. Out of work currently - typically working part time - 6 hours a week. Disabled since 2009. Denies SI or HI.  Denies AH or VH. Denies self harm. Denies substance use.  Previous medication trials: Lexapro, Paxil, Trazadone, Wellbutrin  SR, Geodon, Seroquel, Risperdal, Depakote, Topamax , Lithium, Provigil, Ambien, Cytomel, Clonazepam  and other benzodiazapines, Elavil, Effexor, Remeron, Prozac, Anafranil, Tofranil, Zoloft, Geodon, Buspar, Adderall, Ritalin , Abilify, Rexulti, Lamictal , Latuda   Review of Systems:  Review of Systems  Musculoskeletal:  Negative for gait problem.  Neurological:  Negative for tremors.  Psychiatric/Behavioral:         Please refer to HPI    Medications: I have reviewed the patient's current medications.  Current Outpatient Medications  Medication Sig Dispense Refill   Albuterol-Budesonide (AIRSUPRA ) 90-80 MCG/ACT AERO Inhale 1 each into the lungs 2 (two) times daily.     Albuterol-Budesonide (AIRSUPRA ) 90-80 MCG/ACT AERO Inhale 1 each into the lungs 2 (two) times daily. 1 g 11   buPROPion  (WELLBUTRIN  XL) 150 MG 24 hr tablet TAKE 3 TABLETS EVERY MORNING (Patient taking differently: TAKE 1 TABLET EVERY MORNING) 270 tablet 3  cariprazine  (VRAYLAR ) 3 MG capsule Take 1 capsule (3 mg total) by mouth daily. 90 capsule 0   clonazePAM  (KLONOPIN ) 0.5 MG tablet Take 1 tablet (0.5 mg total) by mouth 2 (two) times daily. 60 tablet 2   diazepam  (VALIUM ) 5 MG tablet  Place 1 tablet vaginally nightly as needed for muscle spasm/ pelvic pain. 30 tablet 0   docusate sodium  (COLACE) 100 MG capsule Take 100 mg by mouth 2 (two) times daily.     DULoxetine  (CYMBALTA ) 60 MG capsule Take 1 capsule (60 mg total) by mouth 2 (two) times daily. 180 capsule 3   Erenumab -aooe (AIMOVIG ) 70 MG/ML SOAJ INJECT 70 MG INTO THE SKIN EVERY 28 DAYS. 1 mL 11   estradiol  (ESTRACE ) 0.1 MG/GM vaginal cream Place 0.5 g vaginally 2 (two) times a week. Place 0.5g nightly for two weeks then twice a week after 42.5 g 11   estradiol  (ESTRACE ) 0.5 MG tablet Take 0.5 mg by mouth daily.     Fe Fum-Fe Poly-Vit C-Lactobac (FUSION) 65-65-25-30 MG CAPS Take by mouth.     fluticasone (FLONASE SENSIMIST) 27.5 MCG/SPRAY nasal spray 2 sprays (1 spray in each nostril) Nasally Once a day during seasons of difficulty for 30 days     ibuprofen (ADVIL) 800 MG tablet Take 800 mg by mouth 3 (three) times daily.     ipratropium (ATROVENT) 0.06 % nasal spray Place 1 spray into both nostrils daily.     lactulose (CHRONULAC) 10 GM/15ML solution TAKE 15 TO 30 ML AS DIRECTED UP TO 2 TIMES DAILY FOR CONSTIPATION 5400 mL 0   levothyroxine  (SYNTHROID , LEVOTHROID) 88 MCG tablet Take 88 mcg by mouth daily before breakfast.     linaclotide (LINZESS) 290 MCG CAPS capsule Take 1 capsule (290 mcg total) by mouth daily before breakfast. 90 capsule 0   liothyronine (CYTOMEL) 5 MCG tablet Take 5 mcg by mouth daily.     Magnesium Citrate (MAGNESIUM GUMMIES PO) Take 125 mg by mouth 2 (two) times daily.     metFORMIN  (GLUCOPHAGE ) 500 MG tablet 1 po with lunch daily 30 tablet 0   methylphenidate  (RITALIN ) 10 MG tablet Take 10 mg by mouth 2 (two) times daily.     Multiple Vitamins-Minerals (MULTIVITAMIN GUMMIES ADULT PO) Take 1 each by mouth daily.     nystatin cream (MYCOSTATIN) Apply 1 application  topically daily as needed for dry skin.     Olopatadine HCl 0.2 % SOLN 1 drop into affected eye Ophthalmic Once a day or prn for 30 days      omeprazole  (PRILOSEC) 20 MG capsule Take 20 mg by mouth 2 (two) times daily before a meal.     ondansetron  (ZOFRAN -ODT) 4 MG disintegrating tablet Take 1 tablet (4 mg total) by mouth every 8 (eight) hours as needed for nausea or vomiting. 20 tablet 11   Oxycodone  HCl 10 MG TABS Take 10 mg by mouth every 6 (six) hours as needed.     potassium chloride  (K-DUR,KLOR-CON ) 10 MEQ tablet Take 1 tablet by mouth daily.     potassium chloride  (MICRO-K ) 10 MEQ CR capsule Take 10 mEq by mouth daily.     progesterone  (PROMETRIUM ) 100 MG capsule Take 100 mg by mouth daily.     promethazine  (PHENERGAN ) 12.5 MG tablet Take 1 tablet (12.5 mg total) by mouth every 8 (eight) hours as needed for nausea or vomiting. 30 tablet 0   Propylene Glycol (SYSTANE COMPLETE) 0.6 % SOLN Apply 2 drops to eye.     rOPINIRole  (  REQUIP ) 2 MG tablet Take 2 mg by mouth 2 (two) times daily.     tiZANidine (ZANAFLEX) 4 MG tablet Take 4 mg by mouth 2 (two) times daily.     torsemide (DEMADEX) 20 MG tablet Take 20 mg by mouth 2 (two) times daily.     traZODone  (DESYREL ) 100 MG tablet Take 2 tablets (200 mg total) by mouth at bedtime. 180 tablet 3   Ubrogepant  (UBRELVY ) 100 MG TABS Take 1 tablet (100 mg total) by mouth daily as needed (at onset of headaches may repeat dose after 2 hours as needed max 200 mg in 24 hours). 16 tablet 11   Vitamin D -Vitamin K (VITAMIN K2-VITAMIN D3 PO) Take 1 each by mouth daily. Vitamin K2 100 mcg/Vitamin D  50 mcg per gummie     No current facility-administered medications for this visit.    Medication Side Effects: None  Allergies:  Allergies  Allergen Reactions   Tape Dermatitis and Itching    BURNING AND BLISTERING - USUALLY DOES OK WITH TEGADERM  Paper tape or Coban OK   Topiramate  Other (See Comments) and Shortness Of Breath    Really wierd feeling  Off balance; cloudy thinking  Really wierd feeling Off balance; cloudy thinking    Off balance; cloudy thinking   Cyclobenzaprine Other  (See Comments)    Other reaction(s): Unknown Hallucinations Unknown    Flexeril [Cyclobenzaprine Hcl] Other (See Comments)    hallucinations   Meloxicam Other (See Comments)   Naltrexone Other (See Comments)    SEVERE PAIN ALL OVER   Pork (Porcine) Protein Other (See Comments)    Other reaction(s): Other (See Comments) Migraines Other reaction(s): Other (See Comments) Migraines   Sumatriptan Other (See Comments)    Difficulty breathing Other reaction(s): Other (see comments) Off balance    Wound Dressing Adhesive Other (See Comments)    Tape - Burns and blisters; caused skin infection   Brexpiprazole Other (See Comments)    Rexulti     Restless arms   Glycopyrrolate  Other (See Comments)    Urinary retention   Imitrex [Sumatriptan Base] Other (See Comments)    Pt unsure of allergic reaction/intolerance   Lamotrigine  Other (See Comments)    Pt unsure of allergy/intolerance   Sulfa Antibiotics Other (See Comments)    Pt states she does not think she is allergic to Sulfa     Past Medical History:  Diagnosis Date   Allergic rhinitis    Allergy    Anemia    Anxiety disorder    Back pain    Bilateral swelling of feet    Bronchitis    Cancer (HCC)    skin   Chronic fatigue    Chronic fatigue    Chronic headaches    Chronic pain    Constipation    Depression    Endometriosis    Family history of colon cancer    mother,uncles,aunts   Fibromyalgia    Fibromyalgia    Gallbladder problem    GERD (gastroesophageal reflux disease)    Hyperlipidemia    Hypothyroidism    IBS (irritable bowel syndrome)    Insomnia    Iron  deficiency    Joint pain    Laryngopharyngeal reflux (LPR)    Obesity    Osteoarthritis    Personal history of colonic polyps 04/2006   polypoid   PONV (postoperative nausea and vomiting)    RLS (restless legs syndrome)    Sleep apnea    no cpap   SOB (  shortness of breath)    Swelling    Vitamin B12 deficiency    Vitamin D  deficiency      Family History  Problem Relation Age of Onset   Obesity Mother    Depression Mother    Cancer Mother    Heart disease Mother    Hyperlipidemia Mother    Hypertension Mother    Colon cancer Mother        uncles,aunts   Sleep apnea Father    Cancer Father    Diabetes Father        paternal grandmother,sister   Leukemia Father    Lymphoma Father    Skin cancer Father    Heart disease Father    Colon polyps Sister    Diabetes Sister    Colon cancer Maternal Aunt    Colon cancer Maternal Uncle    Colon cancer Cousin    Colon polyps Cousin    Stomach cancer Neg Hx    Esophageal cancer Neg Hx    Rectal cancer Neg Hx     Social History   Socioeconomic History   Marital status: Significant Other    Spouse name: Leonce Ee   Number of children: 0   Years of education: Not on file   Highest education level: Not on file  Occupational History   Occupation: RN/disabled  Tobacco Use   Smoking status: Never   Smokeless tobacco: Never  Vaping Use   Vaping status: Never Used  Substance and Sexual Activity   Alcohol  use: Not Currently    Alcohol /week: 0.0 standard drinks of alcohol     Comment: rarely   Drug use: No   Sexual activity: Yes    Birth control/protection: Other-see comments  Other Topics Concern   Not on file  Social History Narrative   Patient is left-handed. She lives alone in a one level home. She has 3 cats. She is unable to exercise. Caffeine one soda / day. On disability now. 2 bachelor degrees   Social Drivers of Health   Financial Resource Strain: Low Risk  (04/06/2024)   [facility]   Overall Financial Resource Strain (CARDIA)    Difficulty of Paying Living Expenses: Not hard at all  Food Insecurity: Low Risk  (08/05/2024)   Received from [facility]   Hunger Vital Sign    Within the past 12 months, you worried that your food would run out before you got money to buy more: Never true    Within the past 12 months, the food you  bought just didn't last and you didn't have money to get more. : Never true  Transportation Needs: No Transportation Needs (08/05/2024)   Received from [facility]    In the past 12 months, has lack of reliable transportation kept you from medical appointments, meetings, work or from getting things needed for daily living? : No  Physical Activity: Not on file  Stress: Not on file  Social Connections: Not on file  Intimate Partner Violence: Not on file    Past Medical History, Surgical history, Social history, and Family history were reviewed and updated as appropriate.   Please see review of systems for further details on the patient's review from today.   Objective:   Physical Exam:  There were no vitals taken for this visit.  Physical Exam Constitutional:      General: She is not in acute distress. Musculoskeletal:        General: No deformity.  Neurological:     Mental Status: She is alert and oriented to person, place, and time.     Coordination: Coordination normal.  Psychiatric:        Attention and Perception: Attention and perception normal. She does not perceive auditory or visual hallucinations.        Mood and Affect: Mood normal. Mood is not anxious or depressed. Affect is not labile, blunt, angry or inappropriate.        Speech: Speech normal.        Behavior: Behavior normal.        Thought Content: Thought content normal. Thought content is not paranoid or delusional. Thought content does not include homicidal or suicidal ideation. Thought content does not include homicidal or suicidal plan.        Cognition and Memory: Cognition and memory normal.        Judgment: Judgment normal.     Comments: Insight intact     Lab Review:     Component Value Date/Time   NA 141 05/25/2024 1214   K 4.2 05/25/2024 1214   CL 99 05/25/2024 1214   CO2 27 05/25/2024 1214   GLUCOSE 95 05/25/2024 1214   GLUCOSE 97 05/26/2019 1046   BUN 16 05/25/2024 1214    CREATININE 0.86 05/25/2024 1214   CALCIUM 9.2 05/25/2024 1214   PROT 6.3 05/25/2024 1214   ALBUMIN 4.2 05/25/2024 1214   AST 32 05/25/2024 1214   ALT 38 (H) 05/25/2024 1214   ALKPHOS 103 05/25/2024 1214   BILITOT 0.3 05/25/2024 1214       Component Value Date/Time   WBC 7.0 05/25/2024 1214   WBC 6.9 05/26/2019 1046   RBC 4.39 05/25/2024 1214   RBC 4.42 05/26/2019 1046   HGB 13.4 05/25/2024 1214   HCT 42.3 05/25/2024 1214   PLT 213 05/25/2024 1214   MCV 96 05/25/2024 1214   MCH 30.5 05/25/2024 1214   MCHC 31.7 05/25/2024 1214   MCHC 32.5 05/26/2019 1046   RDW 12.9 05/25/2024 1214   LYMPHSABS 2.2 05/25/2024 1214   MONOABS 0.6 05/26/2019 1046   EOSABS 0.1 05/25/2024 1214   BASOSABS 0.1 05/25/2024 1214    No results found for: POCLITH, LITHIUM   No results found for: PHENYTOIN, PHENOBARB, VALPROATE, CBMZ   .res Assessment: Plan:     Plan:  1. Wellbutrin  XL 150mg  daily 2. Cymbalta  60mg  BID 3. Trazadone 100mg  - 2 at hs  4. Vraylar  3mg  daily though patient assistance.  5. Clonazepam  0.5mg  BID - taking at bedtime 6. Ritalin  10mg  BID - thru PCP  Continue therapy with Bambi Cottle.   RTC 6 weeks  25 minutes spent dedicated to the care of this patient on the date of this encounter to include pre-visit review of records, ordering of medication, post visit documentation, and face-to-face time with the patient discussing GAD, MDD, PTSD and insomnia. Discussed continuing current medication regimen.  Patient advised to contact office with any questions, adverse effects, or acute worsening in signs and symptoms.  Discussed potential metabolic side effects associated with atypical antipsychotics, as well as potential risk for movement side effects. Advised pt to contact office if movement side effects occur.   Discussed potential benefits, risk, and side effects of benzodiazepines to include potential risk of tolerance and dependence, as well as possible drowsiness.  Advised patient not to drive if experiencing drowsiness and to take lowest possible effective dose to minimize risk of dependence and tolerance.    Diagnoses and all orders  for this visit:  Generalized anxiety disorder -     clonazePAM  (KLONOPIN ) 0.5 MG tablet; Take 1 tablet (0.5 mg total) by mouth 2 (two) times daily.     Please see After Visit Summary for patient specific instructions.  Future Appointments  Date Time Provider Department Center  09/08/2024  2:20 PM Midge Sober, DO [facility] None  09/09/2024  2:00 PM Elnor Channing FALCON, PT [facility]  09/13/2024  1:50 PM Skeet Juliene SAUNDERS, DO [facility] None  09/15/2024  2:00 PM Cottle, Bambi G, LCSW [facility] None  09/16/2024  2:00 PM Elnor Channing FALCON, PT [facility]  10/06/2024 11:20 AM Midge, Sober, DO [facility] None  10/11/2024  1:30 PM Cirigliano, Vito V, DO [facility]  10/13/2024  2:00 PM Cottle, Bambi G, LCSW [facility] None  10/27/2024 10:30 AM Theodoro Lakes, MD [facility] [address]  11/02/2024  4:00 PM Elnor Channing FALCON, PT [facility]  11/09/2024  4:00 PM Elnor Channing FALCON, PT [facility]  11/10/2024  2:00 PM Cottle, Peggye MATSU, LCSW [facility] None  11/12/2024 10:00 AM Marilynne Rosaline SAILOR, MD [facility]  11/16/2024  4:00 PM Elnor Channing FALCON, PT [facility]  12/08/2024  2:00 PM Cottle, Peggye MATSU, LCSW [facility] None  01/05/2025  2:00 PM Cottle, Bambi G, LCSW [facility] None  02/02/2025  2:00 PM Cottle, Bambi G, LCSW [facility] None    No orders of the defined types were placed in this encounter.     -------------------------------

## 2024-09-07 LAB — GENECONNECT MOLECULAR SCREEN: Genetic Analysis Overall Interpretation: NEGATIVE

## 2024-09-08 ENCOUNTER — Ambulatory Visit (INDEPENDENT_AMBULATORY_CARE_PROVIDER_SITE_OTHER): Admitting: Family Medicine

## 2024-09-08 ENCOUNTER — Encounter (INDEPENDENT_AMBULATORY_CARE_PROVIDER_SITE_OTHER): Payer: Self-pay | Admitting: Family Medicine

## 2024-09-08 ENCOUNTER — Other Ambulatory Visit (INDEPENDENT_AMBULATORY_CARE_PROVIDER_SITE_OTHER): Payer: Self-pay | Admitting: Family Medicine

## 2024-09-08 DIAGNOSIS — Z6836 Body mass index (BMI) 36.0-36.9, adult: Secondary | ICD-10-CM

## 2024-09-08 DIAGNOSIS — E88819 Insulin resistance, unspecified: Secondary | ICD-10-CM

## 2024-09-08 DIAGNOSIS — K581 Irritable bowel syndrome with constipation: Secondary | ICD-10-CM

## 2024-09-08 DIAGNOSIS — Z6838 Body mass index (BMI) 38.0-38.9, adult: Secondary | ICD-10-CM

## 2024-09-08 MED ORDER — METFORMIN HCL 500 MG PO TABS: ORAL_TABLET | ORAL | 0 refills | Status: DC

## 2024-09-08 NOTE — Progress Notes (Signed)
 Paige [name], D.O.  ABFM, ABOM Specializing in Clinical Bariatric Medicine  Office located at: [address] [place], [place]  [address]    FOR THE CHRONIC DISEASE OF OBESITY:   Morbid obesity (HCC) Starting BMI 40.57; BMI 38.0-38.9,adult current 36.17  Weight Summary and Body Composition Analysis  Weight Lost Since Last Visit: 0lb  Weight Gained Since Last Visit: 3lb    Vitals Temp: 98.4 F (36.9 C) BP: 93/61 Pulse Rate: 82 SpO2: 95 %   Anthropometric Measurements Height: 5' 7 (1.702 m) Weight: 231 lb (104.8 kg) BMI (Calculated): 36.17 Weight at Last Visit: 228lb Weight Lost Since Last Visit: 0lb Weight Gained Since Last Visit: 3lb Starting Weight: 259lb Total Weight Loss (lbs): 28 lb (12.7 kg) Peak Weight: 316lb Waist Measurement : 51 inches   Body Composition  Body Fat %: 49.9 % Fat Mass (lbs): 115.4 lbs Muscle Mass (lbs): 110.2 lbs Total Body Water (lbs): 0 lbs Visceral Fat Rating : 14   Other Clinical Data Fasting: No Labs: no Today's Visit #: 6 Starting Date: 05/25/24    Chief complaint: Obesity  Interval History Paige [name] is here for a follow-up office visit to discuss her progress with her obesity treatment plan. She is keeping a food journal and adhering to recommended goals of 1200-1300 calories and 85+ grams protein and states she is following her eating plan approximately 100 % of the time. She is walking 10  minutes 7 days per week  She has experienced a weight increase of 3 lbs since last OV on 08/11/24. She attributes her weight gain to a decrease in exercise due to low back pain.   Her dietary and life habits include:  - Tracking Calories/Macros: yes, states she is journaling accurately.   - Eating More Whole Foods: yes  - Adequate Protein Intake: yes  - Adequate Water Intake: yes  - Skipping Meals: no  - Sleeping 7-9 Hours/ Night: yes    08/11/24 14:00 09/08/24 14:00   Body Fat % 47.6 % 49.9 %   Muscle Mass (lbs) 113.8 lbs 110.2 lbs  Fat Mass (lbs) 109 lbs 115.4 lbs  Total Body Water (lbs) 82.6 lbs 0 lbs  Visceral Fat Rating  14 14   Counseling done on how various foods will affect these numbers and how to maximize success  Total lbs lost to date: -28 lbs Total Fat Mass lost to date: -7.8 lbs Total weight loss percentage to date: -10.81 %   Recommended Dietary Goals Paige [name] is currently in the action stage of change. As such, her goal is to continue weight management plan.  We modified her journaling parameters to 1000-1100 cal and 80+ grams protein because of her decreased physical activity.    Nutritional and Behavioral Counseling:  We discussed the following today: increasing fiber rich foods, continue to work on tracking and journaling calories using tracking application, and continue to work on implementation of reduced calorie nutritional plan  Additional resources provided today: Handout on hidden calories   Evidence-based interventions for health behavior change were utilized today including the discussion of self monitoring techniques, problem-solving barriers and SMART goal setting techniques.   Regarding patient's less desirable eating habits and patterns, we employed the technique of small changes.   SMART Goal(s) created today: journal accurately and be mindful of any sources of excess/hidden calories   Recommended Physical Activity Goals Paige [name] has been advised to work up to 300-450 minutes of moderate intensity aerobic activity a week and strengthening  exercises 2-3 times per week for cardiovascular health, weight loss maintenance and preservation of muscle mass.   She was encouraged to Continue to gradually increase the amount and intensity of exercise routine   Medical Interventions and Pharmacotherapy Previous bariatric surgery: History of Roux-en-Y gastric bypass done in 2005 by Dr.Martin at [facility]. Pre-Surgical weight 316. 176 nadir after  several years. Likely regained weight due to not having follow-up care post surgery. Takes bariatric fusion with iron .   Pharmacotherapy that aid with weight loss : See I.R note.    OBESITY RELATED CONDITIONS ADDRESSED TODAY:    Medications Discontinued During This Encounter  Medication Reason   Albuterol-Budesonide (AIRSUPRA ) 90-80 MCG/ACT AERO    potassium chloride  (MICRO-K ) 10 MEQ CR capsule Patient Preference   metFORMIN  (GLUCOPHAGE ) 500 MG tablet      Meds ordered this encounter  Medications   metFORMIN  (GLUCOPHAGE ) 500 MG tablet    Sig: 1 po TID with food    Dispense:  90 tablet    Refill:  0    30 d supply;  ** OV for RF **   Do not send RF request      Irritable bowel syndrome with constipation Assessment & Plan: Reviewed GI OV note dated 09/01/2024. She has been experiencing problems with constipation and bloating. She is on lactulose and was instructed to add Linzess 290 mcg daily at that OV. She does not feel this regimen is helping. She is scheduled for a colonoscopy in the near future. Encouraged adequate fiber intake and hydration. Continue all care per GI. Will monitor alongside specialists.     Insulin  resistance Assessment & Plan: Lab Results  Component Value Date   HGBA1C 5.5 05/25/2024   INSULIN  5.4 05/25/2024    LOV on 9/10, we started her on Metformin  500 mg daily for her hunger and cravings. Denies gastrointestinal issues from the Metformin , but has not noticed any difference in her hunger and cravings. Shared decision making: Up-titrate Metformin  to 500 mg TID as tolerated. Reviewed potential risks/benefits.Continue working on nutrition plan to decrease simple carbohydrates, increase lean proteins and exercise to promote weight loss.    Objective:   PHYSICAL EXAM: Blood pressure 93/61, pulse 82, temperature 98.4 F (36.9 C), height 5' 7 (1.702 m), weight 231 lb (104.8 kg), SpO2 95%. Body mass index is 36.18 kg/m.  General: she is overweight,  cooperative and in no acute distress. PSYCH: Has normal mood, affect and thought process.   HEENT: EOMI, sclerae are anicteric. Lungs: Normal breathing effort, no conversational dyspnea. Extremities: Moves * 4 Neurologic: A and O * 3, good insight  DIAGNOSTIC DATA REVIEWED: BMET    Component Value Date/Time   NA 141 05/25/2024 1214   K 4.2 05/25/2024 1214   CL 99 05/25/2024 1214   CO2 27 05/25/2024 1214   GLUCOSE 95 05/25/2024 1214   GLUCOSE 97 05/26/2019 1046   BUN 16 05/25/2024 1214   CREATININE 0.86 05/25/2024 1214   CALCIUM 9.2 05/25/2024 1214   Lab Results  Component Value Date   HGBA1C 5.5 05/25/2024   Lab Results  Component Value Date   INSULIN  5.4 05/25/2024   Lab Results  Component Value Date   TSH 1.430 05/25/2024   CBC    Component Value Date/Time   WBC 7.0 05/25/2024 1214   WBC 6.9 05/26/2019 1046   RBC 4.39 05/25/2024 1214   RBC 4.42 05/26/2019 1046   HGB 13.4 05/25/2024 1214   HCT 42.3 05/25/2024 1214   PLT 213  05/25/2024 1214   MCV 96 05/25/2024 1214   MCH 30.5 05/25/2024 1214   MCHC 31.7 05/25/2024 1214   MCHC 32.5 05/26/2019 1046   RDW 12.9 05/25/2024 1214   Iron  Studies    Component Value Date/Time   IRON  94 05/25/2024 1214   TIBC 334 05/25/2024 1214   FERRITIN 49 05/25/2024 1214   IRONPCTSAT 28 05/25/2024 1214   Lipid Panel     Component Value Date/Time   CHOL 170 05/25/2024 1214   TRIG 61 05/25/2024 1214   HDL 64 05/25/2024 1214   CHOLHDL 2.7 05/25/2024 1214   LDLCALC 94 05/25/2024 1214   Hepatic Function Panel     Component Value Date/Time   PROT 6.3 05/25/2024 1214   ALBUMIN 4.2 05/25/2024 1214   AST 32 05/25/2024 1214   ALT 38 (H) 05/25/2024 1214   ALKPHOS 103 05/25/2024 1214   BILITOT 0.3 05/25/2024 1214   BILIDIR 0.1 05/12/2017 1222      Component Value Date/Time   TSH 1.430 05/25/2024 1214   Nutritional Lab Results  Component Value Date   VD25OH 81.5 05/25/2024     Follow up:   Return 10/06/2024 at  11:20 AM.  She was informed of the importance of frequent follow up visits to maximize her success with intensive lifestyle modifications for her multiple health conditions.   Attestations:   I, Special Puri, acting as a Stage manager for [employer], DO., have compiled all relevant documentation for today's office visit on behalf of Paige Jenkins, DO, while in the presence of [employer], DO.  Pertinent positives were addressed with patient today. Reviewed by clinician on day of visit: allergies, medications, problem list, medical history, surgical history, family history, social history, and previous encounter notes.  I have reviewed the above documentation for accuracy and completeness, and I agree with the above. Paige [name], D.O.  The 21st Century Cures Act was signed into law in 2016 which includes the topic of electronic health records.  This provides immediate access to information in MyChart. This includes consultation notes, operative notes, office notes, lab results and pathology reports.  If you have any questions about what you read please let us  know at your next visit so we can discuss your concerns and take corrective action if need be.  We are right here with you.

## 2024-09-09 ENCOUNTER — Encounter: Admitting: Physical Therapy

## 2024-09-09 ENCOUNTER — Encounter: Payer: Self-pay | Admitting: Physical Therapy

## 2024-09-09 DIAGNOSIS — R278 Other lack of coordination: Secondary | ICD-10-CM | POA: Diagnosis not present

## 2024-09-09 DIAGNOSIS — L905 Scar conditions and fibrosis of skin: Secondary | ICD-10-CM | POA: Diagnosis not present

## 2024-09-09 DIAGNOSIS — R102 Pelvic and perineal pain unspecified side: Secondary | ICD-10-CM

## 2024-09-09 DIAGNOSIS — M6281 Muscle weakness (generalized): Secondary | ICD-10-CM | POA: Diagnosis not present

## 2024-09-09 NOTE — Progress Notes (Signed)
 Agree with the assessment and plan as outlined by Alan Coombs, PA-C.  Agree that opioids can cause the esophageal stasis seen on barium swallow with clinical dysphagia.  If continued symptoms, agree that esophageal manometry would be helpful to evaluate grade/severity of dysmotility, with treatment targeting opioid reduction.  Would be interested to see if Movantik or Motegrity  would have some benefit on her dysphagia, as these can improve opioid related gastroparesis.  I understand those are currently both cost prohibitive, but would be interested if we can get a trial sample.  Paige Zellman, DO, [facility]

## 2024-09-09 NOTE — Therapy (Signed)
 OUTPATIENT PHYSICAL THERAPY FEMALE PELVIC TREATMENT   Patient Name: Paige [name] MRN: [id number] DOB:05-30-1966, 59 y.o., female Today's Date: 09/09/2024  END OF SESSION:  PT End of Session - 09/09/24 1405     Visit Number 3    Authorization Type [employer] medicare    Authorization - Visit Number 3    Authorization - Number of Visits 10    PT Start Time 1400    PT Stop Time 1445    PT Time Calculation (min) 45 min    Activity Tolerance Patient tolerated treatment well    Behavior During Therapy WFL for tasks assessed/performed          Past Medical History:  Diagnosis Date   Allergic rhinitis    Allergy    Anemia    Anxiety disorder    Back pain    Bilateral swelling of feet    Bronchitis    Cancer (HCC)    skin   Chronic fatigue    Chronic fatigue    Chronic headaches    Chronic pain    Constipation    Depression    Endometriosis    Family history of colon cancer    mother,uncles,aunts   Fibromyalgia    Fibromyalgia    Gallbladder problem    GERD (gastroesophageal reflux disease)    Hyperlipidemia    Hypothyroidism    IBS (irritable bowel syndrome)    Insomnia    Iron  deficiency    Joint pain    Laryngopharyngeal reflux (LPR)    Obesity    Osteoarthritis    Personal history of colonic polyps 04/2006   polypoid   PONV (postoperative nausea and vomiting)    RLS (restless legs syndrome)    Sleep apnea    no cpap   SOB (shortness of breath)    Swelling    Vitamin B12 deficiency    Vitamin D  deficiency    Past Surgical History:  Procedure Laterality Date   ANTERIOR CRUCIATE LIGAMENT REPAIR Left 2003   APPENDECTOMY  1993   BACK SURGERY  03/2024   spinal cord stimulator placed   CARPAL TUNNEL RELEASE Left 09/25/2017   Procedure: LEFT CARPAL TUNNEL RELEASE;  Surgeon: Murrell Kuba, MD;  Location: [facility];  Service: Orthopedics;  Laterality: Left;   CARPAL TUNNEL RELEASE Right    CHOLECYSTECTOMY  1999   COLONOSCOPY  2009    [facility]   DE QUERVAIN'S RELEASE     LUMBAR LAMINECTOMY     L4- S1   PANNICULECTOMY     POLYPECTOMY     REPAIR ANKLE LIGAMENT Left 2001   ROUX-EN-Y GASTRIC BYPASS  04/2004   Dr Donnice Lunger   TONSILLECTOMY AND ADENOIDECTOMY  1976   TRIGGER FINGER RELEASE     Patient Active Problem List   Diagnosis Date Noted   High-tone pelvic floor dysfunction 05/18/2024   SUI (stress urinary incontinence, female) 05/18/2024   Overactive bladder 05/18/2024   Incontinence of feces 05/18/2024   Vaginal atrophy 05/18/2024   Chronic idiopathic constipation 05/18/2024   Nutritional deficiency 03/17/2024   OSA on CPAP 03/17/2024   Encounter for orthopedic follow-up care 01/31/2020   Pain of left hand 12/19/2018   Radial styloid tenosynovitis 12/19/2018   Trigger finger 12/19/2018   Abscess of postoperative wound of abdominal wall 10/05/2018   Pannus, abdominal 09/21/2018   Pigmented skin lesion 06/23/2018   Cervicalgia 02/12/2018   Bilateral carpal tunnel syndrome 08/13/2017   Primary osteoarthritis of both first carpometacarpal joints  08/13/2017   Muscle tension dysphonia 08/12/2016   Vocal cord atrophy 08/12/2016   Hoarseness 07/02/2016   Laryngopharyngeal reflux (LPR) 07/02/2016   Cough 05/23/2016   Dyspnea 05/23/2016   Chronic pain 05/22/2016   Sacrococcygeal disorders, not elsewhere classified 11/24/2015   Anemia, iron  deficiency 08/24/2015   Fibrositis 08/17/2015   Abnormal weight gain 08/17/2015   Anxiety 08/17/2015   Recurrent major depressive episodes 08/17/2015   History of migraine headaches 08/17/2015   Neurosis, posttraumatic 08/17/2015   Bariatric surgery status 08/17/2015   DDD (degenerative disc disease), lumbar 01/18/2015   Facet joint syndrome 01/18/2015   Thoracic spondylosis 01/18/2015   Myofascial pain 02/22/2013   Back ache 07/09/2012   Family history of malignant neoplasm of gastrointestinal tract 06/10/2011   History of colonic polyps 06/10/2011    Esophageal reflux 06/10/2011   Cellulitis 06/10/2011   History of Roux-en-Y gastric bypass 06/10/2011   B12 nutritional deficiency 06/10/2011   Morbid obesity due to excess calories (HCC) 06/10/2011   Personal history of fibromyalgia 06/10/2011   Recurrent major depressive disorder, in partial remission 06/10/2011   Acid reflux 06/10/2011    PCP: Viona Fitch, MD  REFERRING PROVIDER: Marilynne Rosaline SAILOR, MD   REFERRING DIAG:  M62.89 (ICD-10-CM) - High-tone pelvic floor dysfunction  N39.3 (ICD-10-CM) - SUI (stress urinary incontinence, female)  N32.81 (ICD-10-CM) - Overactive bladder  R15.9 (ICD-10-CM) - Incontinence of feces, unspecified fecal incontinence type    THERAPY DIAG:  Muscle weakness (generalized)  Other lack of coordination  Scar  Pelvic pain  Rationale for Evaluation and Treatment: Rehabilitation  ONSET DATE: 12/2023  SUBJECTIVE:                                                                                                                                                                                           SUBJECTIVE STATEMENT: I felt good after last visit. Linzess is not helping. I had fecal leakage in bed. The stool was watery. Patient is not able to sit on the commode to have a bowel movement.   Fluid intake:  100oz water, 1 small bottle diet dr pepper per day. Has an occasional coffee. Usually only has sips of water before bed.   FUNCTIONAL LIMITATIONS: Patient uses a stick for walking  PERTINENT HISTORY:  Surgeries: Cholecystectomy; appendectomy; back surgery with spinal cored stimulator; Roux-EN-Y gastric bypass Other: Skin Caner; Fibromyalgia; IBS; Hypothyroidism;    PAIN:  Are you having pain? Yes NPRS scale: 5/10 Pain location: lower abdominal area  Pain type: aching Pain description: constant   Aggravating factors: needing to have a bowel movement and not able to Relieving factors: heat at  one tim  PRECAUTIONS: Other: skin  cancer; spinal cord stimulator  RED FLAGS: None   WEIGHT BEARING RESTRICTIONS: No  FALLS:  Has patient fallen in last 6 months? No  OCCUPATION: disability  ACTIVITY LEVEL : Patient has stopped exercising since 9/1 due to back pain  PLOF: Independent  PATIENT GOALS: be able to urinate better, not have to use an enema to have a bowel movement, reduce pelvic pain   BOWEL MOVEMENT: Pain with bowel movement: Yes, 8-9/10 Type of bowel movement:0-4 time(s) per week ; strains; Type 1, hard rocks Fully empty rectum: No Leakage: Consistency with leakage: soft                                                      Caused by: small amounts weekly, usually after an incomplete BM  Pads: Yes: depends at night Fiber supplement/laxative stool softener (dulcolax 5mg  BID), Ibrelsa BID   URINATION: Pain with urination: Yes, burning of retention Fully empty bladder: Nonot able to urinate right away, sometimes needs to run water; she will wipe then urinate again                          Post-void dribble: No Stream: Weak Urgency: No Frequency:during the day  8+                                                          Nocturia: Yes:  2-6 times per night to void    Leakage: Coughing, Sneezing, Laughing, and without sensation Pads/briefs:  0-2 pads per day ; depends  at night  INTERCOURSE:  Ability to have vaginal penetration Yes  Pain with intercourse: Initial Penetration and Deep Penetration Dryness: Yes    PROLAPSE: None   OBJECTIVE:  Note: Objective measures were completed at Evaluation unless otherwise noted.  DIAGNOSTIC FINDINGS:  Pelvic floor strength I/V, puborectalis I/V external anal sphincter I/V   Pelvic floor musculature: Right levator tender, Right obturator tender, Left levator tender, Left obturator tender. High tone throughout Post Void Residual 10 mL      COGNITION: Overall cognitive status: Within functional limits for tasks assessed     SENSATION: Light touch:  Appears intact   GAIT: Assistive device utilized: walking stick  POSTURE: rounded shoulders, forward head, and decreased lumbar lordosis; Right shoulder higher; patient had scoliosos   LUMBARAROM/PROM:not tested due to fusion    LOWER EXTREMITY MNF:qloo bilateral hip ROM   LOWER EXTREMITY MMT:  MMT Right eval Left eval  Hip flexion 4/5 4/5  Hip extension 4/5 4/5  Hip abduction 3/5 3/5   (Blank rows = not tested) PALPATION:  Abdominal: tenderness of the lower abdomen; increased lower rib cage angle  Diastasis: No Distortion: No  Breathing: contract the abdominals and will raise the lower rib cage Scar tissue: Yes: along the lower abdomen, lower umbilicus,                 External Perineal Exam: redness along the posterior introitus, tenderness located on the perineal body and around the rectum  Internal Pelvic Floor: therapist was not able to get the index finger past the DIP joint into the vaginal canal due to pain; going through the rectum the puborectalis did not move forward well, tenderness throughout the levator ani and obturator internist and in the rectum.   Patient confirms identification and approves PT to assess internal pelvic floor and treatment Yes No emotional/communication barriers or cognitive limitation. Patient is motivated to learn. Patient understands and agrees with treatment goals and plan. PT explains patient will be examined in standing, sitting, and lying down to see how their muscles and joints work. When they are ready, they will be asked to remove their underwear so PT can examine their perineum. The patient is also given the option of providing their own chaperone as one is not provided in our facility. The patient also has the right and is explained the right to defer or refuse any part of the evaluation or treatment including the internal exam. With the patient's consent, PT will use one gloved finger to gently assess the  muscles of the pelvic floor, seeing how well it contracts and relaxes and if there is muscle symmetry. After, the patient will get dressed and PT and patient will discuss exam findings and plan of care. PT and patient discuss plan of care, schedule, attendance policy and HEP activities.   PELVIC MMT:   MMT eval  Vaginal 2/5  Internal Anal Sphincter 2/5  External Anal Sphincter 2/5  Puborectalis 2/5  (Blank rows = not tested)        TONE: Increased tone of the pelvic floor  PROLAPSE: none  TODAY'S TREATMENT:     09/09/24 Manual: Soft tissue mobilization: Circular massage of the abdomen to promote peristalic motion Scar tissue mobilization: Scar mobilization going through the restrictions working in different directions to improve mobility Using a suction cup to lift the scar up and improve movement Myofascial release: Fascial release of the Mons pubis and round ligament, and pubovesical ligament Neuromuscular re-education: Core retraining: Supine transverse abdominus contraction 10 x  Supine bilateral shoulder extension to engage the abdominals Supine trunk rotation at shoulder at 90 degrees shoulder flexion Down training: Diaphragmatic breathing with opening the rib cage then having the air travel to the abdomen    09/02/24 Manual: Soft tissue mobilization: Manual work to the diaphragm to assist with diaphragmatic breathing Circular massage of the abdomen to promote peristalic motion Scar tissue mobilization: Scar mobilization going through the restrictions working in different directions to improve mobility Using a suction cup to lift the scar up and improve movement Myofascial release: Tissue rolling of the abdomen and around the ribs to increase tissue mobility Neuromuscular re-education: Down training: Diaphragmatic breathing with opening the rib cage then having the air travel to the abdomen Self-care: Educated patient and her partner on how to perform scar  massage to her lower abdomen and the verbally understand  DATE: 08/26/24  EVAL Examination completed, findings reviewed, pt educated on POC, HEP, and female pelvic floor anatomy, reasoning with pelvic floor assessment internally with pt consent, and abdominal massage. Pt motivated to participate in PT and agreeable to attempt recommendations.     PATIENT EDUCATION:  09/09/24 Education details: Access Code: [email or web address], scar massage Person educated: Patient Education method: Explanation, Demonstration, Tactile cues, Verbal cues, and Handouts Education comprehension: verbalized understanding, returned demonstration, verbal cues required, tactile cues required, and needs further education  HOME EXERCISE PROGRAM: 09/09/24 Access Code: [email or web address] URL: [email or web address] Date: 09/09/2024 Prepared by: Channing Pereyra  Exercises - Supine Diaphragmatic Breathing  - 2 x daily - 7 x weekly - 1 sets - 10 reps - Seated Diaphragmatic Breathing  - 2 x daily - 7 x weekly - 1 sets - 10 reps - Shoulder Extension with Resistance - 90/90 on Swiss Ball  - 1 x daily - 7 x weekly - 1 sets - 10 reps - Hooklying Anti-Rotation Press With Anchored Resistance  - 1 x daily - 7 x weekly - 1 sets - 10 reps - Hooklying Transversus Abdominis Palpation  - 1 x daily - 7 x weekly - 1 sets - 10 reps  Patient Education - Abdominal Massage for Constipation - Abdominal Massage for Constipation   ASSESSMENT:  CLINICAL IMPRESSION: Patient is a 58 y.o. female who was seen today for physical therapy  treatment for high toned pelvic floor, urinary and fecal incontinence. Patient had increased mobility of her scar and diaphragm. She was able to perform diaphragmatic breathing correctly. Patient was able to stand straighter after therapy. She was able to contract her abdominals better. She had  tenderness along the round ligament. She has not had a bowel movement in 5 days. She has had 1 fecal accident in her bed. Patient will benefit from skilled therapy to improve pelvic floor muscle excursion, reduce pain and coordination to help with continence and toileting.   OBJECTIVE IMPAIRMENTS: decreased activity tolerance, decreased coordination, decreased strength, increased fascial restrictions, increased muscle spasms, and pain.   ACTIVITY LIMITATIONS: sleeping, continence, and toileting  PARTICIPATION LIMITATIONS: meal prep, cleaning, laundry, interpersonal relationship, shopping, and community activity  PERSONAL FACTORS: Cholecystectomy; appendectomy; back surgery with spinal cored stimulator; Roux-EN-Y gastric bypass are also affecting patient's functional outcome.   REHAB POTENTIAL: Excellent  CLINICAL DECISION MAKING: Unstable/unpredictable  EVALUATION COMPLEXITY: High   GOALS: Goals reviewed with patient? Yes  SHORT TERM GOALS: Target date: 09/23/24  Patient educated on abdominal massage to assist with peristalic motion of the intestines.  Baseline: Goal status: Met 09/02/24  2.  Patient educated on scar mobilization to improve mobility and reduce pain.  Baseline:  Goal status: Met 09/02/24  3.  Patient is able to perform diaphragmatic breathing to lengthen the pelvic floor to urinate and have a bowel movement.  Baseline:  Goal status: Met 09/09/24  4.  Patient educated on correct ways to have a bowel movement to reduce straining.  Baseline:  Goal status: INITIAL  LONG TERM GOALS: Target date: 11/18/24  Patient independent with advanced HEP for core and pelvic floor strength to improve continence.  Baseline:  Goal status: INITIAL  2.  Patient is able to sit on commode and urinate within 15 seconds due to the ability to relax her pelvic floor.  Baseline:  Goal status: INITIAL  3.  Patient is able to sit on the commode to have a bowel movement and not strain  50% of the time due to the ability  to relax the pelvic floor.  Baseline:  Goal status: INITIAL  4.  Patient has increased in lower abdominal scar to reduce her lower abdominal pain </= 2/10.  Baseline:  Goal status: INITIAL  5.  Patient reports her urinary leakage decreased </= 70% due to the ability to fully relax the pelvic floor and contract and reduction of trigger points.  Baseline:  Goal status: INITIAL   PLAN:  PT FREQUENCY: 1-2x/week  PT DURATION: 12 weeks  PLANNED INTERVENTIONS: 97110-Therapeutic exercises, 97530- Therapeutic activity, 97112- Neuromuscular re-education, 97535- Self Care, [id number]- Manual therapy, 20560 (1-2 muscles), 20561 (3+ muscles)- Dry Needling, Patient/Family education, Cryotherapy, Moist heat, and Biofeedback  PLAN FOR NEXT SESSION: manual work to lower abdominal scar,  review toileting, manual work on the outside of the vagina   Channing Pereyra, PT 09/09/24 2:57 PM

## 2024-09-10 DIAGNOSIS — D225 Melanocytic nevi of trunk: Secondary | ICD-10-CM | POA: Diagnosis not present

## 2024-09-10 DIAGNOSIS — D235 Other benign neoplasm of skin of trunk: Secondary | ICD-10-CM | POA: Diagnosis not present

## 2024-09-10 DIAGNOSIS — D2272 Melanocytic nevi of left lower limb, including hip: Secondary | ICD-10-CM | POA: Diagnosis not present

## 2024-09-10 DIAGNOSIS — Z85828 Personal history of other malignant neoplasm of skin: Secondary | ICD-10-CM | POA: Diagnosis not present

## 2024-09-13 ENCOUNTER — Encounter: Payer: Self-pay | Admitting: Neurology

## 2024-09-13 ENCOUNTER — Ambulatory Visit (INDEPENDENT_AMBULATORY_CARE_PROVIDER_SITE_OTHER): Payer: Medicare HMO | Admitting: Neurology

## 2024-09-13 VITALS — BP 132/58 | HR 70 | Ht 67.0 in | Wt 238.0 lb

## 2024-09-13 DIAGNOSIS — G43009 Migraine without aura, not intractable, without status migrainosus: Secondary | ICD-10-CM

## 2024-09-13 MED ORDER — ONDANSETRON 4 MG PO TBDP: 4.0000 mg | ORAL_TABLET | Freq: Three times a day (TID) | ORAL | 11 refills | Status: AC | PRN

## 2024-09-13 NOTE — Progress Notes (Signed)
 NEUROLOGY FOLLOW UP OFFICE NOTE  Paige [name] [id number]  Assessment/Plan:   Migraine without aura, without status migrainosus, not intractable    Migraine prevention:  Aimovig  70mg  every 28 days.   Migraine rescue:  Ubrelvy  100mg   Limit use of pain relievers to no more than 2 days out of week to prevent risk of rebound or medication-overuse headache. Keep headache diary Follow up one year     Subjective:  Paige [name] is a 58 year old right-handed female with hypothyroidism, restless leg syndrome and depression/PTSD who follows up for migraines.  UPDATE: Migraine: Intensity:  moderate Duration:  30-60 minutes Frequency:  usually no more than 1 day a month (last week prior to injection) Frequency of abortive medication: 3 times over a year.  She has OSA and uses a BiPAP.  Much better than the CPAP Currently dealing with right lumbar radiculopathy. Received epidural which helped.     Current NSAIDS: None Current analgesics: oxycodone  Current triptans: None Current ergotamine: None Current anti-emetic: Zofran  ODT 4 mg, promethazine  12.5mg  Current muscle relaxants: tizanidine 4mg  BID Current sleep aide: Trazodone  100 mg Current Antihypertensive medications: None Current Antidepressant medications: Cymbalta  60 mg, Wellbutrin  XR 150 mg, trazodone  200mg  at bedtime Current Anticonvulsant medications: gabapentin  300mg  TID Current anti-CGRP: Aimovig  70mg , Ubrelvy  100mg  Current Vitamins/Herbal/Supplements: magnesium citrate 125mg  Current Antihistamines/Decongestants: Flonase Other therapy:  none Other medication:  Requip , Ritalin , levothyroxine , clonazepam  0.5mg  BID, diazepam       HISTORY: Migraine: Onset:  Since teenager.  She has migraines as well as daily headache which have gotten worse over the past 2 years. Location:  Migraines: bi-frontal/retro-orbital/temples; Daily headache:  Bi-frontal/temporal Quality:  Migraine: pounding; Daily headache:  non-throbbing Initial intensity:  Migraine: 10/10; Daily headache: 6/10 Aura:  no Prodrome:  no Associated symptoms:  Migraine: nausea, photophobia, phonophobia, osmophobia, sometimes vomiting; Daily headache: sometimes photophobia Initial Duration:  Migraine:  Several hours; Daily headache: constant Initial Frequency:  Migraine: every other week; Daily headache:  constant Triggers:  Certain scents, bright light, pork Relieving factors:  Rest Activity:  Cannot function with migraine   Still with morning headaches and daytime Past NSAIDS:  Ibuprofen, naproxen  Past analgesics:  Percocet (hives), morphine  (itching) Past abortive triptans:  sumatriptan (difficulty breathing), rizatriptan , eletriptan Past muscle relaxants:  cyclobenzaprine (hallucinations) Past anti-emetic:  phenergan  Past antihypertensive medications:  no Past antidepressant medications:  Amitriptyline, possibly venlafaxine Past anticonvulsant medications:  Depakote, topiramate  100mg  twice daily (side effect), zonisamide  50mg , lamotrigine ,  Past anti-CGRP:  Ubrelvy  100mg  (effective but no longer covered by her insurance) Past vitamins/Herbal/Supplements:  no Other past therapy:  Botox , acupuncture    Family history of headache:  Paternal aunt   Chronic Pain/Lumbar spinal stenosis/Cauda equina Syndrome: Since early 2020, she reports numbness and tingling in the toes.  TSH from 05/26/19 was 1.72.  NCV-EMG from 07/15/19 was normal.  Labs from June 2020 demonstrated TSH 1.72.  Labs from October 2020 demonstrated ANA negative; sed rate 19; SPEP/IFE negative for M-spike; B12 401; B6 6.2.  Punch Skin Biopsy performed in November 2020 was negative for small fiber neuropathy but demonstrated some degenerative changes within epidermal nerves which are non-specific and may be normal but also may be predictive of future development of clinical neuropathy.  She had inserted a spina stimulator in the right hip in December 2020.  She  subsequently developed cauda equina syndrome with severe pain radiating down both legs with new bilateral lower extremity tingling, saddle anesthesia and bowel and bladder dysfunction.  CT of lumbar spine showed  severe spinal stenosis at L4-5 with bulging disc and progressed anteriolithesis, moderate to severe subarticular stenosis bilaterally, and levoscoliosis at T12-L1.  MRI unable to be performed due to spinal stimulator, so CT myelogram was performed, showing severe spinal stenosis at L4-5 with effacement of root bilaterally and severe compression of the canal.  She underwent L4-S1 laminectomy TLIF and PSF in July 2021.  She has improved bowel and bladder function.    History of Subjective Memory Deficits: She has noted short term memory problems since around 2017.  She frequently misplaces items around the house.  She does get distracted and will not get around to chores around the house.  She has been late paying bills on three occasions.  One time, she got disoriented driving on a familiar route.  She sometimes has trouble recalling names of familiar people.  She also has word-finding difficulty and problems with spelling.  She saw a speech pathologist who told her she has memory deficits.  Her thyroid  panel was within normal range.   B12 level from 04/07/2018 was 560.  She underwent neuropsychological testing on 09/16/2018 which demonstrated deficits in verbal fluency, working memory, and mental flexibility as well as mildly below expectation performances in processing speed, learning and memory for information that is not repeated, and verbal abstract reasoning.  However, visual-spatial skills, basic attention, nonverbal abstract reasoning, confrontation naming, and memory for information that is repeated were well within normal limits.  There was no evidence of cortical dysfunction or early onset Alzheimer's disease.  Her cognitive difficulties were suspected to be related to her severe depression, high  level of pain, and insomnia. Her uncle and aunt on her mother's side had dementia.  PAST MEDICAL HISTORY: Past Medical History:  Diagnosis Date   Allergic rhinitis    Allergy    Anemia    Anxiety disorder    Back pain    Bilateral swelling of feet    Bronchitis    Cancer (HCC)    skin   Chronic fatigue    Chronic fatigue    Chronic headaches    Chronic pain    Constipation    Depression    Endometriosis    Family history of colon cancer    mother,uncles,aunts   Fibromyalgia    Fibromyalgia    Gallbladder problem    GERD (gastroesophageal reflux disease)    Hyperlipidemia    Hypothyroidism    IBS (irritable bowel syndrome)    Insomnia    Iron  deficiency    Joint pain    Laryngopharyngeal reflux (LPR)    Obesity    Osteoarthritis    Personal history of colonic polyps 04/2006   polypoid   PONV (postoperative nausea and vomiting)    RLS (restless legs syndrome)    Sleep apnea    no cpap   SOB (shortness of breath)    Swelling    Vitamin B12 deficiency    Vitamin D  deficiency     MEDICATIONS: Current Outpatient Medications on File Prior to Visit  Medication Sig Dispense Refill   Albuterol-Budesonide (AIRSUPRA ) 90-80 MCG/ACT AERO Inhale 1 each into the lungs 2 (two) times daily.     buPROPion  (WELLBUTRIN  XL) 150 MG 24 hr tablet TAKE 3 TABLETS EVERY MORNING (Patient taking differently: TAKE 1 TABLET EVERY MORNING) 270 tablet 3   cariprazine  (VRAYLAR ) 3 MG capsule Take 1 capsule (3 mg total) by mouth daily. 90 capsule 0   clonazePAM  (KLONOPIN ) 0.5 MG tablet Take 1 tablet (0.5 mg total)  by mouth 2 (two) times daily. 60 tablet 2   diazepam  (VALIUM ) 5 MG tablet Place 1 tablet vaginally nightly as needed for muscle spasm/ pelvic pain. 30 tablet 0   docusate sodium  (COLACE) 100 MG capsule Take 100 mg by mouth 2 (two) times daily.     DULoxetine  (CYMBALTA ) 60 MG capsule Take 1 capsule (60 mg total) by mouth 2 (two) times daily. 180 capsule 3   Erenumab -aooe (AIMOVIG ) 70  MG/ML SOAJ INJECT 70 MG INTO THE SKIN EVERY 28 DAYS. 1 mL 11   estradiol  (ESTRACE ) 0.1 MG/GM vaginal cream Place 0.5 g vaginally 2 (two) times a week. Place 0.5g nightly for two weeks then twice a week after 42.5 g 11   estradiol  (ESTRACE ) 0.5 MG tablet Take 0.5 mg by mouth daily.     Fe Fum-Fe Poly-Vit C-Lactobac (FUSION) 65-65-25-30 MG CAPS Take by mouth.     fluticasone (FLONASE SENSIMIST) 27.5 MCG/SPRAY nasal spray 2 sprays (1 spray in each nostril) Nasally Once a day during seasons of difficulty for 30 days     gabapentin  (NEURONTIN ) 300 MG capsule Take 300 mg by mouth 3 (three) times daily.     ibuprofen (ADVIL) 800 MG tablet Take 800 mg by mouth 3 (three) times daily.     ipratropium (ATROVENT) 0.06 % nasal spray Place 1 spray into both nostrils daily.     lactulose (CHRONULAC) 10 GM/15ML solution TAKE 15 TO 30 ML AS DIRECTED UP TO 2 TIMES DAILY FOR CONSTIPATION 5400 mL 0   levothyroxine  (SYNTHROID , LEVOTHROID) 88 MCG tablet Take 88 mcg by mouth daily before breakfast.     linaclotide (LINZESS) 290 MCG CAPS capsule Take 1 capsule (290 mcg total) by mouth daily before breakfast. 90 capsule 0   liothyronine (CYTOMEL) 5 MCG tablet Take 5 mcg by mouth daily.     Magnesium Citrate (MAGNESIUM GUMMIES PO) Take 125 mg by mouth 2 (two) times daily.     metFORMIN  (GLUCOPHAGE ) 500 MG tablet 1 po TID with food 90 tablet 0   methylphenidate  (RITALIN ) 10 MG tablet Take 10 mg by mouth 2 (two) times daily.     Multiple Vitamins-Minerals (MULTIVITAMIN GUMMIES ADULT PO) Take 1 each by mouth daily.     nystatin cream (MYCOSTATIN) Apply 1 application  topically daily as needed for dry skin.     Olopatadine HCl 0.2 % SOLN 1 drop into affected eye Ophthalmic Once a day or prn for 30 days     omeprazole  (PRILOSEC) 20 MG capsule Take 20 mg by mouth 2 (two) times daily before a meal.     ondansetron  (ZOFRAN -ODT) 4 MG disintegrating tablet Take 1 tablet (4 mg total) by mouth every 8 (eight) hours as needed for  nausea or vomiting. 20 tablet 11   Oxycodone  HCl 10 MG TABS Take 10 mg by mouth every 6 (six) hours as needed.     potassium chloride  (K-DUR,KLOR-CON ) 10 MEQ tablet Take 1 tablet by mouth daily.     progesterone  (PROMETRIUM ) 100 MG capsule Take 100 mg by mouth daily.     promethazine  (PHENERGAN ) 12.5 MG tablet Take 1 tablet (12.5 mg total) by mouth every 8 (eight) hours as needed for nausea or vomiting. 30 tablet 0   Propylene Glycol (SYSTANE COMPLETE) 0.6 % SOLN Apply 2 drops to eye.     rOPINIRole  (REQUIP ) 2 MG tablet Take 2 mg by mouth 2 (two) times daily.     tiZANidine (ZANAFLEX) 4 MG tablet Take 4 mg by mouth 2 (two)  times daily.     torsemide (DEMADEX) 20 MG tablet Take 20 mg by mouth 2 (two) times daily.     traZODone  (DESYREL ) 100 MG tablet Take 2 tablets (200 mg total) by mouth at bedtime. 180 tablet 3   Ubrogepant  (UBRELVY ) 100 MG TABS Take 1 tablet (100 mg total) by mouth daily as needed (at onset of headaches may repeat dose after 2 hours as needed max 200 mg in 24 hours). 16 tablet 11   Vitamin D -Vitamin K (VITAMIN K2-VITAMIN D3 PO) Take 1 each by mouth daily. Vitamin K2 100 mcg/Vitamin D  50 mcg per gummie     No current facility-administered medications on file prior to visit.    ALLERGIES: Allergies  Allergen Reactions   Tape Dermatitis and Itching    BURNING AND BLISTERING - USUALLY DOES OK WITH TEGADERM  Paper tape or Coban OK   Topiramate  Other (See Comments) and Shortness Of Breath    Really wierd feeling  Off balance; cloudy thinking  Really wierd feeling Off balance; cloudy thinking    Off balance; cloudy thinking   Cyclobenzaprine Other (See Comments)    Other reaction(s): Unknown Hallucinations Unknown    Flexeril [Cyclobenzaprine Hcl] Other (See Comments)    hallucinations   Meloxicam Other (See Comments)   Naltrexone Other (See Comments)    SEVERE PAIN ALL OVER   Porcine (Pork) Protein Other (See Comments)    Other reaction(s): Other (See  Comments) Migraines Other reaction(s): Other (See Comments) Migraines   Sumatriptan Other (See Comments)    Difficulty breathing Other reaction(s): Other (see comments) Off balance    Wound Dressing Adhesive Other (See Comments)    Tape - Burns and blisters; caused skin infection   Brexpiprazole Other (See Comments)    Rexulti     Restless arms   Glycopyrrolate  Other (See Comments)    Urinary retention   Imitrex [Sumatriptan Base] Other (See Comments)    Pt unsure of allergic reaction/intolerance   Lamotrigine  Other (See Comments)    Pt unsure of allergy/intolerance   Sulfa Antibiotics Other (See Comments)    Pt states she does not think she is allergic to Sulfa     FAMILY HISTORY: Family History  Problem Relation Age of Onset   Obesity Mother    Depression Mother    Cancer Mother    Heart disease Mother    Hyperlipidemia Mother    Hypertension Mother    Colon cancer Mother        uncles,aunts   Sleep apnea Father    Cancer Father    Diabetes Father        paternal grandmother,sister   Leukemia Father    Lymphoma Father    Skin cancer Father    Heart disease Father    Colon polyps Sister    Diabetes Sister    Colon cancer Maternal Aunt    Colon cancer Maternal Uncle    Colon cancer Cousin    Colon polyps Cousin    Stomach cancer Neg Hx    Esophageal cancer Neg Hx    Rectal cancer Neg Hx       Objective:  Blood pressure (!) 132/58, pulse 70, height 5' 7 (1.702 m), weight 238 lb (108 kg), SpO2 95%. General: No acute distress.  Patient appears well-groomed.   Head:  Normocephalic/atraumatic Neck:  Supple.  No paraspinal tenderness.  Full range of motion. Heart:  Regular rate and rhythm. Neuro:  Alert and oriented.  Speech fluent and not dysarthric.  Language intact.  CN II-XII intact.  Bulk and tone normal.  Muscle strength 5/5 throughout.  Sensation to pinprick hyperesthetic on right lower extremity.  Vibratory sensation intact.  Deep tendon reflexes 2+  throughout, toes downgoing.  Gait normal.  Romberg negative.    Juliene Dunnings, DO  CC: Aleck Milch, MD

## 2024-09-13 NOTE — Patient Instructions (Signed)
 Aimovig  70mg  every 4 weeks Ubrelvy  and ondansetron  as needed

## 2024-09-15 ENCOUNTER — Ambulatory Visit: Admitting: Psychology

## 2024-09-15 DIAGNOSIS — F3341 Major depressive disorder, recurrent, in partial remission: Secondary | ICD-10-CM

## 2024-09-15 DIAGNOSIS — F4310 Post-traumatic stress disorder, unspecified: Secondary | ICD-10-CM

## 2024-09-15 DIAGNOSIS — F411 Generalized anxiety disorder: Secondary | ICD-10-CM | POA: Diagnosis not present

## 2024-09-15 DIAGNOSIS — Z981 Arthrodesis status: Secondary | ICD-10-CM | POA: Diagnosis not present

## 2024-09-15 DIAGNOSIS — M5416 Radiculopathy, lumbar region: Secondary | ICD-10-CM | POA: Diagnosis not present

## 2024-09-15 NOTE — Progress Notes (Unsigned)
 [facility] Counselor/Therapist Progress Note  Patient ID: Paige [name], MRN: [id number],    Date: 09/15/2024  Time Spent: 51 minutes  Time in:2:10 Time out:3:01  Treatment Type: Individual Therapy  Reported Symptoms: depression and worry  Mental Status Exam: Appearance:  Casual     Behavior: Appropriate  Motor: Normal  Speech/Language:  Normal Rate  Affect: blunted  Mood: pleasant  Thought process: normal  Thought content:   WNL  Sensory/Perceptual disturbances:   WNL  Orientation: oriented to person, place, time/date, and situation  Attention: Good  Concentration: Good  Memory: WNL  Fund of knowledge:  Good  Insight:   Good  Judgment:  Fair  Impulse Control: Fair   Risk Assessment: Danger to Self:  No Self-injurious Behavior: No Danger to Others: No Duty to Warn:no Physical Aggression / Violence:No  Access to Firearms a concern: No  Gang Involvement:No   Subjective: The patient attended a face-to-face individual therapy session via video visit.  The patient gave verbal consent for this session to be on video on caregility and patient is aware of the limitations of telehealth..  The patient was in her home alone and therapist was in the office alone.  The patient presents as pleasant and cooperative.  The patient reports that things have been going better than they were the last time I saw her.  She continues to do really well on her healthy weight and wellness program and has lost 30 pounds.  She states that she is going to have to have some sort of injections in her back in the near future because she is still struggling with pain.  She seems to be more pleasant and happy so I think that probably her pain is better controlled right now.  In addition she is doing well with her partner and she has a lot of good things going on in her life and that she is helping her partner with his job and she feels productive in that way.  We talked about how far she  has come since she has been in therapy and she is handling things well.  She does want to continue to see me until I retire and she will be likely ready to graduate then.   Interventions: Cognitive Behavioral Therapy  Diagnosis:Recurrent major depressive disorder, in partial remission  Generalized anxiety disorder  PTSD (post-traumatic stress disorder)  Plan: Treatment Plan  Strengths/Abilities:  Intelligent, kind, motivated  Treatment Preferences:  Outpatient Individual therapy  Statement of Needs:  I need some help with getting back out into the world and learn how to interact and be social   Symptoms:  Describes a reliving of the event, particularly through dissociative flashbacks.:  (Status: improved). Displays a significant decline in interest and engagement in activities.: (Status: improved). Displays significant psychological and/or physiological  distress resulting from internal and external clues that are reminiscent of the traumatic event.:  (Status: improved). Experiences disturbances in sleep.:   (Status: improved). Experiences disturbing and persistent thoughts, images, and/or perceptions of the  traumatic event.: (Status: improved). Experiences frequent nightmares.:  (Status: improved). Has been exposed to a traumatic event involving actual or  perceived threat of death or serious injury.: (Status: maintained).  Hypervigilance (e.g., feeling constantly on edge, experiencing concentration difficulties, having trouble falling or staying asleep, exhibiting a general state of irritability).: (Status:  improved). Impairment in social, occupational, or other areas of functioning.:  (Status: improved). Intentionally avoids activities, places, people, or objects (e.g., up-armored vehicles) that evoke memories  of the event.:  (Status: improved). Intentionally avoids  thoughts, feelings, or discussions related to the traumatic event.: (Status:  improved). Reports difficulty  concentrating as well as feelings of guilt.:   (Status: improved). Reports response of intense fear, helplessness, or horror to the traumatic event.: (Status: improved). Symptoms present more than one month.:  (Status: maintained).    Problems Addressed:  Anxiety, Posttraumatic Stress Disorder (PTSD),  Goals:  LTG:  1. Enhance ability to effectively cope with the full variety of life's worries  and anxieties.  90% 2. No longer avoids persons, places, activities, and objects that are  reminiscent of the traumatic event.  90% 3. No longer experiences intrusive event recollections, avoidance of event  reminders, intense arousal, or disinterest in activities or  relationships.  100% 4. Stabilize anxiety level while increasing ability to function on a daily  basis.  90% 5. Thinks about or openly discusses the traumatic event with others  without experiencing psychological or physiological distress.  80%  STG: 1.Identify and engage in pleasant activities on a daily basis..  90% 2.Identify, challenge, and replace biased, fearful self-talk with positive, realistic, and empowering self talk 3.  Participate in Cognitive Therapy to help identify, challenge, and replace biased, negative, and selfdefeating thoughts resulting from the trauma.  80 % 4.  Participate in Eye Movement Desensitization and Reprocessing (EMDR) to reduce emotional distress  related to traumatic thoughts, feelings, and images.  90%  Target Date:  02/16/2025 Frequency: Every three weeks Modality:Individual Therapy Interventions by Therapist:  CBT, EMDR, insight oriented therapy, problem solving  Patient approved treatment plan and is progressing nicely.  Makye Radle G Caryle Helgeson, LCSW

## 2024-09-16 ENCOUNTER — Encounter: Payer: Self-pay | Admitting: Physical Therapy

## 2024-09-16 ENCOUNTER — Encounter: Admitting: Physical Therapy

## 2024-09-16 DIAGNOSIS — L905 Scar conditions and fibrosis of skin: Secondary | ICD-10-CM

## 2024-09-16 DIAGNOSIS — R102 Pelvic and perineal pain unspecified side: Secondary | ICD-10-CM

## 2024-09-16 DIAGNOSIS — R278 Other lack of coordination: Secondary | ICD-10-CM | POA: Diagnosis not present

## 2024-09-16 DIAGNOSIS — M6281 Muscle weakness (generalized): Secondary | ICD-10-CM

## 2024-09-16 NOTE — Therapy (Signed)
 OUTPATIENT PHYSICAL THERAPY FEMALE PELVIC TREATMENT   Patient Name: Paige [name] MRN: [id number] DOB:1966-06-09, 58 y.o., female Today's Date: 09/16/2024  END OF SESSION:  PT End of Session - 09/16/24 1401     Visit Number 4    Date for Recertification  11/18/24    Authorization Type [employer] medicare    Authorization - Visit Number 4    Authorization - Number of Visits 10    PT Start Time 1400    PT Stop Time 1445    PT Time Calculation (min) 45 min    Activity Tolerance Patient tolerated treatment well    Behavior During Therapy WFL for tasks assessed/performed          Past Medical History:  Diagnosis Date   Allergic rhinitis    Allergy    Anemia    Anxiety disorder    Back pain    Bilateral swelling of feet    Bronchitis    Cancer (HCC)    skin   Chronic fatigue    Chronic fatigue    Chronic headaches    Chronic pain    Constipation    Depression    Endometriosis    Family history of colon cancer    mother,uncles,aunts   Fibromyalgia    Fibromyalgia    Gallbladder problem    GERD (gastroesophageal reflux disease)    Hyperlipidemia    Hypothyroidism    IBS (irritable bowel syndrome)    Insomnia    Iron  deficiency    Joint pain    Laryngopharyngeal reflux (LPR)    Obesity    Osteoarthritis    Personal history of colonic polyps 04/2006   polypoid   PONV (postoperative nausea and vomiting)    RLS (restless legs syndrome)    Sleep apnea    no cpap   SOB (shortness of breath)    Swelling    Vitamin B12 deficiency    Vitamin D  deficiency    Past Surgical History:  Procedure Laterality Date   ANTERIOR CRUCIATE LIGAMENT REPAIR Left 2003   APPENDECTOMY  1993   BACK SURGERY  03/2024   spinal cord stimulator placed   CARPAL TUNNEL RELEASE Left 09/25/2017   Procedure: LEFT CARPAL TUNNEL RELEASE;  Surgeon: Murrell Kuba, MD;  Location: [facility];  Service: Orthopedics;  Laterality: Left;   CARPAL TUNNEL RELEASE Right     CHOLECYSTECTOMY  1999   COLONOSCOPY  2009   patterson   DE QUERVAIN'S RELEASE     LUMBAR LAMINECTOMY     L4- S1   PANNICULECTOMY     POLYPECTOMY     REPAIR ANKLE LIGAMENT Left 2001   ROUX-EN-Y GASTRIC BYPASS  04/2004   Dr Donnice Lunger   TONSILLECTOMY AND ADENOIDECTOMY  1976   TRIGGER FINGER RELEASE     Patient Active Problem List   Diagnosis Date Noted   High-tone pelvic floor dysfunction 05/18/2024   SUI (stress urinary incontinence, female) 05/18/2024   Overactive bladder 05/18/2024   Incontinence of feces 05/18/2024   Vaginal atrophy 05/18/2024   Chronic idiopathic constipation 05/18/2024   Nutritional deficiency 03/17/2024   OSA on CPAP 03/17/2024   Encounter for orthopedic follow-up care 01/31/2020   Pain of left hand 12/19/2018   Radial styloid tenosynovitis 12/19/2018   Trigger finger 12/19/2018   Abscess of postoperative wound of abdominal wall 10/05/2018   Pannus, abdominal 09/21/2018   Pigmented skin lesion 06/23/2018   Cervicalgia 02/12/2018   Bilateral carpal tunnel syndrome 08/13/2017  Primary osteoarthritis of both first carpometacarpal joints 08/13/2017   Muscle tension dysphonia 08/12/2016   Vocal cord atrophy 08/12/2016   Hoarseness 07/02/2016   Laryngopharyngeal reflux (LPR) 07/02/2016   Cough 05/23/2016   Dyspnea 05/23/2016   Chronic pain 05/22/2016   Sacrococcygeal disorders, not elsewhere classified 11/24/2015   Anemia, iron  deficiency 08/24/2015   Fibrositis 08/17/2015   Abnormal weight gain 08/17/2015   Anxiety 08/17/2015   Recurrent major depressive episodes 08/17/2015   History of migraine headaches 08/17/2015   Neurosis, posttraumatic 08/17/2015   Bariatric surgery status 08/17/2015   DDD (degenerative disc disease), lumbar 01/18/2015   Facet joint syndrome 01/18/2015   Thoracic spondylosis 01/18/2015   Myofascial pain 02/22/2013   Back ache 07/09/2012   Family history of malignant neoplasm of gastrointestinal tract 06/10/2011    History of colonic polyps 06/10/2011   Esophageal reflux 06/10/2011   Cellulitis 06/10/2011   History of Roux-en-Y gastric bypass 06/10/2011   B12 nutritional deficiency 06/10/2011   Morbid obesity due to excess calories (HCC) 06/10/2011   Personal history of fibromyalgia 06/10/2011   Recurrent major depressive disorder, in partial remission 06/10/2011   Acid reflux 06/10/2011    PCP: Viona Fitch, MD  REFERRING PROVIDER: Marilynne Rosaline SAILOR, MD   REFERRING DIAG:  M62.89 (ICD-10-CM) - High-tone pelvic floor dysfunction  N39.3 (ICD-10-CM) - SUI (stress urinary incontinence, female)  N32.81 (ICD-10-CM) - Overactive bladder  R15.9 (ICD-10-CM) - Incontinence of feces, unspecified fecal incontinence type    THERAPY DIAG:  Muscle weakness (generalized)  Other lack of coordination  Scar  Pelvic pain  Rationale for Evaluation and Treatment: Rehabilitation  ONSET DATE: 12/2023  SUBJECTIVE:                                                                                                                                                                                           SUBJECTIVE STATEMENT: My lower abdomen does not feel like a tight band. I went 3 times on Sunday naturally.   Fluid intake:  100oz water, 1 small bottle diet dr pepper per day. Has an occasional coffee. Usually only has sips of water before bed.   FUNCTIONAL LIMITATIONS: Patient uses a stick for walking  PERTINENT HISTORY:  Surgeries: Cholecystectomy; appendectomy; back surgery with spinal cored stimulator; Roux-EN-Y gastric bypass Other: Skin Caner; Fibromyalgia; IBS; Hypothyroidism;    PAIN:  Are you having pain? Yes NPRS scale: 5/10 Pain location: lower abdominal area  Pain type: aching Pain description: constant   Aggravating factors: needing to have a bowel movement and not able to Relieving factors: heat at one tim  PRECAUTIONS: Other: skin cancer; spinal cord stimulator  RED  FLAGS: None   WEIGHT BEARING RESTRICTIONS: No  FALLS:  Has patient fallen in last 6 months? No  OCCUPATION: disability  ACTIVITY LEVEL : Patient has stopped exercising since 9/1 due to back pain  PLOF: Independent  PATIENT GOALS: be able to urinate better, not have to use an enema to have a bowel movement, reduce pelvic pain   BOWEL MOVEMENT: Pain with bowel movement: Yes, 8-9/10 Type of bowel movement:0-4 time(s) per week ; strains; Type 1, hard rocks Fully empty rectum: No Leakage: Consistency with leakage: soft                                                      Caused by: small amounts weekly, usually after an incomplete BM  Pads: Yes: depends at night Fiber supplement/laxative stool softener (dulcolax 5mg  BID), Ibrelsa BID   URINATION: Pain with urination: Yes, burning of retention Fully empty bladder: Nonot able to urinate right away, sometimes needs to run water; she will wipe then urinate again                          Post-void dribble: No Stream: Weak Urgency: No Frequency:during the day  8+                                                          Nocturia: Yes:  2-6 times per night to void    Leakage: Coughing, Sneezing, Laughing, and without sensation Pads/briefs:  0-2 pads per day ; depends  at night  INTERCOURSE:  Ability to have vaginal penetration Yes  Pain with intercourse: Initial Penetration and Deep Penetration Dryness: Yes    PROLAPSE: None   OBJECTIVE:  Note: Objective measures were completed at Evaluation unless otherwise noted.  DIAGNOSTIC FINDINGS:  Pelvic floor strength I/V, puborectalis I/V external anal sphincter I/V   Pelvic floor musculature: Right levator tender, Right obturator tender, Left levator tender, Left obturator tender. High tone throughout Post Void Residual 10 mL      COGNITION: Overall cognitive status: Within functional limits for tasks assessed     SENSATION: Light touch: Appears intact   GAIT: Assistive  device utilized: walking stick  POSTURE: rounded shoulders, forward head, and decreased lumbar lordosis; Right shoulder higher; patient had scoliosos   LUMBARAROM/PROM:not tested due to fusion    LOWER EXTREMITY MNF:qloo bilateral hip ROM   LOWER EXTREMITY MMT:  MMT Right eval Left eval  Hip flexion 4/5 4/5  Hip extension 4/5 4/5  Hip abduction 3/5 3/5   (Blank rows = not tested) PALPATION:  Abdominal: tenderness of the lower abdomen; increased lower rib cage angle  Diastasis: No Distortion: No  Breathing: contract the abdominals and will raise the lower rib cage Scar tissue: Yes: along the lower abdomen, lower umbilicus,                 External Perineal Exam: redness along the posterior introitus, tenderness located on the perineal body and around the rectum  Internal Pelvic Floor: therapist was not able to get the index finger past the DIP joint into the vaginal canal due to pain; going through the rectum the puborectalis did not move forward well, tenderness throughout the levator ani and obturator internist and in the rectum.   Patient confirms identification and approves PT to assess internal pelvic floor and treatment Yes No emotional/communication barriers or cognitive limitation. Patient is motivated to learn. Patient understands and agrees with treatment goals and plan. PT explains patient will be examined in standing, sitting, and lying down to see how their muscles and joints work. When they are ready, they will be asked to remove their underwear so PT can examine their perineum. The patient is also given the option of providing their own chaperone as one is not provided in our facility. The patient also has the right and is explained the right to defer or refuse any part of the evaluation or treatment including the internal exam. With the patient's consent, PT will use one gloved finger to gently assess the muscles of the pelvic floor, seeing  how well it contracts and relaxes and if there is muscle symmetry. After, the patient will get dressed and PT and patient will discuss exam findings and plan of care. PT and patient discuss plan of care, schedule, attendance policy and HEP activities.   PELVIC MMT:   MMT eval 09/16/24  Vaginal 2/5 4/5  Internal Anal Sphincter 2/5   External Anal Sphincter 2/5   Puborectalis 2/5   (Blank rows = not tested)        TONE: Increased tone of the pelvic floor  PROLAPSE: none  TODAY'S TREATMENT:  09/16/24 Manual: Soft tissue mobilization: Circular massage of the abdomen to promote peristalic motion Manual work to the hip adductors Scar tissue mobilization: Scar mobilization going through the restrictions working in different directions to improve mobility Using a suction cup to lift the scar up and improve movement Myofascial release: Fascial release of the urogenital diaphragm  Internal pelvic floor techniques: No emotional/communication barriers or cognitive limitation. Patient is motivated to learn. Patient understands and agrees with treatment goals and plan. PT explains patient will be examined in standing, sitting, and lying down to see how their muscles and joints work. When they are ready, they will be asked to remove their underwear so PT can examine their perineum. The patient is also given the option of providing their own chaperone as one is not provided in our facility. The patient also has the right and is explained the right to defer or refuse any part of the evaluation or treatment including the internal exam. With the patient's consent, PT will use one gloved finger to gently assess the muscles of the pelvic floor, seeing how well it contracts and relaxes and if there is muscle symmetry. After, the patient will get dressed and PT and patient will discuss exam findings and plan of care. PT and patient discuss plan of care, schedule, attendance policy and HEP activities.   Therapist gloved finger going through the vaginal canal working on the sides of the vaginal canal, along the pubovaginalis, urethra sphincter Therapist gloved finger going through the vaginal canal releasing the area around the right side of the pelvic floor with other hand along the suprapubic area to work on the scar Neuromuscular re-education: Pelvic floor contraction training: Therapist gloved finger going into the vaginal canal working on the pelvic floor contraction and feeling the lower abdominals contract Contract the pelvic floor in standing  Contract the pelvic floor with cough or sneeze.        09/09/24 Manual: Soft tissue mobilization: Circular massage of the abdomen to promote peristalic motion Scar tissue mobilization: Scar mobilization going through the restrictions working in different directions to improve mobility Using a suction cup to lift the scar up and improve movement Myofascial release: Fascial release of the Mons pubis and round ligament, and pubovesical ligament Neuromuscular re-education: Core retraining: Supine transverse abdominus contraction 10 x  Supine bilateral shoulder extension to engage the abdominals Supine trunk rotation at shoulder at 90 degrees shoulder flexion Down training: Diaphragmatic breathing with opening the rib cage then having the air travel to the abdomen    09/02/24 Manual: Soft tissue mobilization: Manual work to the diaphragm to assist with diaphragmatic breathing Circular massage of the abdomen to promote peristalic motion Scar tissue mobilization: Scar mobilization going through the restrictions working in different directions to improve mobility Using a suction cup to lift the scar up and improve movement Myofascial release: Tissue rolling of the abdomen and around the ribs to increase tissue mobility Neuromuscular re-education: Down training: Diaphragmatic breathing with opening the rib cage then having the air travel to  the abdomen Self-care: Educated patient and her partner on how to perform scar massage to her lower abdomen and the verbally understand      PATIENT EDUCATION:  09/09/24 Education details: Access Code: [email or web address], scar massage Person educated: Patient Education method: Explanation, Demonstration, Tactile cues, Verbal cues, and Handouts Education comprehension: verbalized understanding, returned demonstration, verbal cues required, tactile cues required, and needs further education  HOME EXERCISE PROGRAM: 09/09/24 Access Code: [email or web address] URL: [email or web address] Date: 09/09/2024 Prepared by: Channing Pereyra  Exercises - Supine Diaphragmatic Breathing  - 2 x daily - 7 x weekly - 1 sets - 10 reps - Seated Diaphragmatic Breathing  - 2 x daily - 7 x weekly - 1 sets - 10 reps - Shoulder Extension with Resistance - 90/90 on Swiss Ball  - 1 x daily - 7 x weekly - 1 sets - 10 reps - Hooklying Anti-Rotation Press With Anchored Resistance  - 1 x daily - 7 x weekly - 1 sets - 10 reps - Hooklying Transversus Abdominis Palpation  - 1 x daily - 7 x weekly - 1 sets - 10 reps  Patient Education - Abdominal Massage for Constipation - Abdominal Massage for Constipation   ASSESSMENT:  CLINICAL IMPRESSION: Patient is a 57 y.o. female who was seen today for physical therapy  treatment for high toned pelvic floor, urinary and fecal incontinence. Patient had increased mobility of her scar and diaphragm. Patient was able to have 3 bowel movements naturally for the first time.  Pelvic floor strength is 4/5 and therapist was able to feel the abdominal contract. Her right side of pelvic floor is tighter than the left. She is able to feel the pelvic floor contract in standing. She is using the toilet technique for urine and bowel movements. Patient will benefit from skilled therapy to improve pelvic floor muscle excursion, reduce pain and coordination to help with continence and toileting.   OBJECTIVE  IMPAIRMENTS: decreased activity tolerance, decreased coordination, decreased strength, increased fascial restrictions, increased muscle spasms, and pain.   ACTIVITY LIMITATIONS: sleeping, continence, and toileting  PARTICIPATION LIMITATIONS: meal prep, cleaning, laundry, interpersonal relationship, shopping, and community activity  PERSONAL FACTORS: Cholecystectomy; appendectomy; back surgery with spinal cored stimulator; Roux-EN-Y gastric bypass are also affecting patient's functional outcome.   REHAB POTENTIAL: Excellent  CLINICAL DECISION MAKING:  Unstable/unpredictable  EVALUATION COMPLEXITY: High   GOALS: Goals reviewed with patient? Yes  SHORT TERM GOALS: Target date: 09/23/24  Patient educated on abdominal massage to assist with peristalic motion of the intestines.  Baseline: Goal status: Met 09/02/24  2.  Patient educated on scar mobilization to improve mobility and reduce pain.  Baseline:  Goal status: Met 09/02/24  3.  Patient is able to perform diaphragmatic breathing to lengthen the pelvic floor to urinate and have a bowel movement.  Baseline:  Goal status: Met 09/09/24  4.  Patient educated on correct ways to have a bowel movement to reduce straining.  Baseline:  Goal status: Met 09/16/24  LONG TERM GOALS: Target date: 11/18/24  Patient independent with advanced HEP for core and pelvic floor strength to improve continence.  Baseline:  Goal status: INITIAL  2.  Patient is able to sit on commode and urinate within 15 seconds due to the ability to relax her pelvic floor.  Baseline:  Goal status: INITIAL  3.  Patient is able to sit on the commode to have a bowel movement and not strain 50% of the time due to the ability to relax the pelvic floor.  Baseline:  Goal status: INITIAL  4.  Patient has increased in lower abdominal scar to reduce her lower abdominal pain </= 2/10.  Baseline:  Goal status: INITIAL  5.  Patient reports her urinary leakage decreased  </= 70% due to the ability to fully relax the pelvic floor and contract and reduction of trigger points.  Baseline:  Goal status: INITIAL   PLAN:  PT FREQUENCY: 1-2x/week  PT DURATION: 12 weeks  PLANNED INTERVENTIONS: 97110-Therapeutic exercises, 97530- Therapeutic activity, 97112- Neuromuscular re-education, 97535- Self Care, [id number]- Manual therapy, 20560 (1-2 muscles), 20561 (3+ muscles)- Dry Needling, Patient/Family education, Cryotherapy, Moist heat, and Biofeedback  PLAN FOR NEXT SESSION:  manual work on the outside of the vagina , core exercises  Channing Pereyra, PT 09/16/24 3:00 PM

## 2024-09-21 DIAGNOSIS — H1045 Other chronic allergic conjunctivitis: Secondary | ICD-10-CM | POA: Diagnosis not present

## 2024-09-21 DIAGNOSIS — J300 Vasomotor rhinitis: Secondary | ICD-10-CM | POA: Diagnosis not present

## 2024-09-21 DIAGNOSIS — L299 Pruritus, unspecified: Secondary | ICD-10-CM | POA: Diagnosis not present

## 2024-09-23 ENCOUNTER — Encounter: Payer: Self-pay | Admitting: Physical Therapy

## 2024-09-23 ENCOUNTER — Other Ambulatory Visit: Payer: Self-pay | Admitting: Obstetrics and Gynecology

## 2024-09-23 DIAGNOSIS — M6281 Muscle weakness (generalized): Secondary | ICD-10-CM | POA: Diagnosis not present

## 2024-09-23 DIAGNOSIS — R278 Other lack of coordination: Secondary | ICD-10-CM

## 2024-09-23 DIAGNOSIS — L905 Scar conditions and fibrosis of skin: Secondary | ICD-10-CM

## 2024-09-23 DIAGNOSIS — R102 Pelvic and perineal pain unspecified side: Secondary | ICD-10-CM | POA: Diagnosis not present

## 2024-09-23 DIAGNOSIS — M6289 Other specified disorders of muscle: Secondary | ICD-10-CM

## 2024-09-23 NOTE — Therapy (Signed)
 OUTPATIENT PHYSICAL THERAPY FEMALE PELVIC TREATMENT   Patient Name: Paige [name] MRN: [id number] DOB:[birth year], 58 y.o., female Today's Date: 09/23/2024  END OF SESSION:  PT End of Session - 09/23/24 1129     Visit Number 5    Date for Recertification  11/18/24    Authorization Type [employer] medicare    Authorization - Visit Number 5    Authorization - Number of Visits 10    PT Start Time 1130    PT Stop Time 1215    PT Time Calculation (min) 45 min    Activity Tolerance Patient tolerated treatment well    Behavior During Therapy WFL for tasks assessed/performed          Past Medical History:  Diagnosis Date   Allergic rhinitis    Allergy    Anemia    Anxiety disorder    Back pain    Bilateral swelling of feet    Bronchitis    Cancer (HCC)    skin   Chronic fatigue    Chronic fatigue    Chronic headaches    Chronic pain    Constipation    Depression    Endometriosis    Family history of colon cancer    mother,uncles,aunts   Fibromyalgia    Fibromyalgia    Gallbladder problem    GERD (gastroesophageal reflux disease)    Hyperlipidemia    Hypothyroidism    IBS (irritable bowel syndrome)    Insomnia    Iron  deficiency    Joint pain    Laryngopharyngeal reflux (LPR)    Obesity    Osteoarthritis    Personal history of colonic polyps 04/2006   polypoid   PONV (postoperative nausea and vomiting)    RLS (restless legs syndrome)    Sleep apnea    no cpap   SOB (shortness of breath)    Swelling    Vitamin B12 deficiency    Vitamin D  deficiency    Past Surgical History:  Procedure Laterality Date   ANTERIOR CRUCIATE LIGAMENT REPAIR Left 2003   APPENDECTOMY  1993   BACK SURGERY  03/2024   spinal cord stimulator placed   CARPAL TUNNEL RELEASE Left 09/25/2017   Procedure: LEFT CARPAL TUNNEL RELEASE;  Surgeon: Murrell Kuba, MD;  Location: [facility];  Service: Orthopedics;  Laterality: Left;   CARPAL TUNNEL RELEASE Right     CHOLECYSTECTOMY  1999   COLONOSCOPY  2009   [facility]   DE QUERVAIN'S RELEASE     LUMBAR LAMINECTOMY     L4- S1   PANNICULECTOMY     POLYPECTOMY     REPAIR ANKLE LIGAMENT Left 2001   ROUX-EN-Y GASTRIC BYPASS  04/2004   Dr Donnice Lunger   TONSILLECTOMY AND ADENOIDECTOMY  1976   TRIGGER FINGER RELEASE     Patient Active Problem List   Diagnosis Date Noted   High-tone pelvic floor dysfunction 05/18/2024   SUI (stress urinary incontinence, female) 05/18/2024   Overactive bladder 05/18/2024   Incontinence of feces 05/18/2024   Vaginal atrophy 05/18/2024   Chronic idiopathic constipation 05/18/2024   Nutritional deficiency 03/17/2024   OSA on CPAP 03/17/2024   Encounter for orthopedic follow-up care 01/31/2020   Pain of left hand 12/19/2018   Radial styloid tenosynovitis 12/19/2018   Trigger finger 12/19/2018   Abscess of postoperative wound of abdominal wall 10/05/2018   Pannus, abdominal 09/21/2018   Pigmented skin lesion 06/23/2018   Cervicalgia 02/12/2018   Bilateral carpal tunnel syndrome 08/13/2017  Primary osteoarthritis of both first carpometacarpal joints 08/13/2017   Muscle tension dysphonia 08/12/2016   Vocal cord atrophy 08/12/2016   Hoarseness 07/02/2016   Laryngopharyngeal reflux (LPR) 07/02/2016   Cough 05/23/2016   Dyspnea 05/23/2016   Chronic pain 05/22/2016   Sacrococcygeal disorders, not elsewhere classified 11/24/2015   Anemia, iron  deficiency 08/24/2015   Fibrositis 08/17/2015   Abnormal weight gain 08/17/2015   Anxiety 08/17/2015   Recurrent major depressive episodes 08/17/2015   History of migraine headaches 08/17/2015   Neurosis, posttraumatic 08/17/2015   Bariatric surgery status 08/17/2015   DDD (degenerative disc disease), lumbar 01/18/2015   Facet joint syndrome 01/18/2015   Thoracic spondylosis 01/18/2015   Myofascial pain 02/22/2013   Back ache 07/09/2012   Family history of malignant neoplasm of gastrointestinal tract 06/10/2011    History of colonic polyps 06/10/2011   Esophageal reflux 06/10/2011   Cellulitis 06/10/2011   History of Roux-en-Y gastric bypass 06/10/2011   B12 nutritional deficiency 06/10/2011   Morbid obesity due to excess calories (HCC) 06/10/2011   Personal history of fibromyalgia 06/10/2011   Recurrent major depressive disorder, in partial remission 06/10/2011   Acid reflux 06/10/2011    PCP: Viona Fitch, MD  REFERRING PROVIDER: Marilynne Rosaline SAILOR, MD   REFERRING DIAG:  M62.89 (ICD-10-CM) - High-tone pelvic floor dysfunction  N39.3 (ICD-10-CM) - SUI (stress urinary incontinence, female)  N32.81 (ICD-10-CM) - Overactive bladder  R15.9 (ICD-10-CM) - Incontinence of feces, unspecified fecal incontinence type    THERAPY DIAG:  Muscle weakness (generalized)  Other lack of coordination  Scar  Pelvic pain  Rationale for Evaluation and Treatment: Rehabilitation  ONSET DATE: 12/2023  SUBJECTIVE:                                                                                                                                                                                           SUBJECTIVE STATEMENT: I am able to pass stool with greater ease.   Fluid intake:  100oz water, 1 small bottle diet dr pepper per day. Has an occasional coffee. Usually only has sips of water before bed.   FUNCTIONAL LIMITATIONS: Patient uses a stick for walking  PERTINENT HISTORY:  Surgeries: Cholecystectomy; appendectomy; back surgery with spinal cored stimulator; Roux-EN-Y gastric bypass Other: Skin Caner; Fibromyalgia; IBS; Hypothyroidism;    PAIN:  Are you having pain? Yes NPRS scale: 5/10 Pain location: lower abdominal area  Pain type: aching Pain description: constant   Aggravating factors: needing to have a bowel movement and not able to Relieving factors: heat at one tim  PRECAUTIONS: Other: skin cancer; spinal cord stimulator  RED FLAGS: None   WEIGHT BEARING  RESTRICTIONS:  No  FALLS:  Has patient fallen in last 6 months? No  OCCUPATION: disability  ACTIVITY LEVEL : Patient has stopped exercising since 9/1 due to back pain  PLOF: Independent  PATIENT GOALS: be able to urinate better, not have to use an enema to have a bowel movement, reduce pelvic pain   BOWEL MOVEMENT: Pain with bowel movement: Yes, 8-9/10 Type of bowel movement:0-4 time(s) per week ; strains; Type 1, hard rocks Fully empty rectum: No Leakage: Consistency with leakage: soft                                                      Caused by: small amounts weekly, usually after an incomplete BM  Pads: Yes: depends at night Fiber supplement/laxative stool softener (dulcolax 5mg  BID), Ibrelsa BID   URINATION: Pain with urination: Yes, burning of retention Fully empty bladder: Nonot able to urinate right away, sometimes needs to run water; she will wipe then urinate again                          Post-void dribble: No Stream: Weak Urgency: No Frequency:during the day  8+                                                          Nocturia: Yes:  2-6 times per night to void    Leakage: Coughing, Sneezing, Laughing, and without sensation Pads/briefs:  0-2 pads per day ; depends  at night  INTERCOURSE:  Ability to have vaginal penetration Yes  Pain with intercourse: Initial Penetration and Deep Penetration Dryness: Yes    PROLAPSE: None   OBJECTIVE:  Note: Objective measures were completed at Evaluation unless otherwise noted.  DIAGNOSTIC FINDINGS:  Pelvic floor strength I/V, puborectalis I/V external anal sphincter I/V   Pelvic floor musculature: Right levator tender, Right obturator tender, Left levator tender, Left obturator tender. High tone throughout Post Void Residual 10 mL      COGNITION: Overall cognitive status: Within functional limits for tasks assessed     SENSATION: Light touch: Appears intact   GAIT: Assistive device utilized: walking stick  POSTURE:  rounded shoulders, forward head, and decreased lumbar lordosis; Right shoulder higher; patient had scoliosos   LUMBARAROM/PROM:not tested due to fusion    LOWER EXTREMITY MNF:qloo bilateral hip ROM   LOWER EXTREMITY MMT:  MMT Right eval Left eval  Hip flexion 4/5 4/5  Hip extension 4/5 4/5  Hip abduction 3/5 3/5   (Blank rows = not tested) PALPATION:  Abdominal: tenderness of the lower abdomen; increased lower rib cage angle  Diastasis: No Distortion: No  Breathing: contract the abdominals and will raise the lower rib cage Scar tissue: Yes: along the lower abdomen, lower umbilicus,                 External Perineal Exam: redness along the posterior introitus, tenderness located on the perineal body and around the rectum                             Internal Pelvic Floor:  therapist was not able to get the index finger past the DIP joint into the vaginal canal due to pain; going through the rectum the puborectalis did not move forward well, tenderness throughout the levator ani and obturator internist and in the rectum.   Patient confirms identification and approves PT to assess internal pelvic floor and treatment Yes No emotional/communication barriers or cognitive limitation. Patient is motivated to learn. Patient understands and agrees with treatment goals and plan. PT explains patient will be examined in standing, sitting, and lying down to see how their muscles and joints work. When they are ready, they will be asked to remove their underwear so PT can examine their perineum. The patient is also given the option of providing their own chaperone as one is not provided in our facility. The patient also has the right and is explained the right to defer or refuse any part of the evaluation or treatment including the internal exam. With the patient's consent, PT will use one gloved finger to gently assess the muscles of the pelvic floor, seeing how well it contracts and relaxes and if  there is muscle symmetry. After, the patient will get dressed and PT and patient will discuss exam findings and plan of care. PT and patient discuss plan of care, schedule, attendance policy and HEP activities.   PELVIC MMT:   MMT eval 09/16/24  Vaginal 2/5 4/5  Internal Anal Sphincter 2/5   External Anal Sphincter 2/5   Puborectalis 2/5   (Blank rows = not tested)        TONE: Increased tone of the pelvic floor  PROLAPSE: none  TODAY'S TREATMENT:  09/23/24 Manual: Soft tissue mobilization: Manual work to the right hip adductors, levator ani, along the urogenital diaphragm, perineal body and superior transverse perineum Internal pelvic floor techniques: No emotional/communication barriers or cognitive limitation. Patient is motivated to learn. Patient understands and agrees with treatment goals and plan. PT explains patient will be examined in standing, sitting, and lying down to see how their muscles and joints work. When they are ready, they will be asked to remove their underwear so PT can examine their perineum. The patient is also given the option of providing their own chaperone as one is not provided in our facility. The patient also has the right and is explained the right to defer or refuse any part of the evaluation or treatment including the internal exam. With the patient's consent, PT will use one gloved finger to gently assess the muscles of the pelvic floor, seeing how well it contracts and relaxes and if there is muscle symmetry. After, the patient will get dressed and PT and patient will discuss exam findings and plan of care. PT and patient discuss plan of care, schedule, attendance policy and HEP activities.  Therapist gloved finger in the vaginal canal work on the right pelvic floor muscles going very slowly due to the tissue is thin and umcomfortable. There fascial release along the right side of the bladder, along the urethra, and right anterior vaginal wall to release  the tissue and restrictions.  Exercises: Strengthening: Standing bilateral shoulder extension with red band 15 x 2 Standing diagonal pulling down to opposite hip 15 x 2 Standing pushing red band forward from behind 15 x 2 Self-care: Discussed with patient on using her vaginal moisturizers to help with the discomfort with internal work    09/16/24 Manual: Soft tissue mobilization: Circular massage of the abdomen to promote peristalic motion Manual work to the  hip adductors Scar tissue mobilization: Scar mobilization going through the restrictions working in different directions to improve mobility Using a suction cup to lift the scar up and improve movement Myofascial release: Fascial release of the urogenital diaphragm  Internal pelvic floor techniques: No emotional/communication barriers or cognitive limitation. Patient is motivated to learn. Patient understands and agrees with treatment goals and plan. PT explains patient will be examined in standing, sitting, and lying down to see how their muscles and joints work. When they are ready, they will be asked to remove their underwear so PT can examine their perineum. The patient is also given the option of providing their own chaperone as one is not provided in our facility. The patient also has the right and is explained the right to defer or refuse any part of the evaluation or treatment including the internal exam. With the patient's consent, PT will use one gloved finger to gently assess the muscles of the pelvic floor, seeing how well it contracts and relaxes and if there is muscle symmetry. After, the patient will get dressed and PT and patient will discuss exam findings and plan of care. PT and patient discuss plan of care, schedule, attendance policy and HEP activities.  Therapist gloved finger going through the vaginal canal working on the sides of the vaginal canal, along the pubovaginalis, urethra sphincter Therapist gloved finger  going through the vaginal canal releasing the area around the right side of the pelvic floor with other hand along the suprapubic area to work on the scar Neuromuscular re-education: Pelvic floor contraction training: Therapist gloved finger going into the vaginal canal working on the pelvic floor contraction and feeling the lower abdominals contract Contract the pelvic floor in standing Contract the pelvic floor with cough or sneeze.        09/09/24 Manual: Soft tissue mobilization: Circular massage of the abdomen to promote peristalic motion Scar tissue mobilization: Scar mobilization going through the restrictions working in different directions to improve mobility Using a suction cup to lift the scar up and improve movement Myofascial release: Fascial release of the Mons pubis and round ligament, and pubovesical ligament Neuromuscular re-education: Core retraining: Supine transverse abdominus contraction 10 x  Supine bilateral shoulder extension to engage the abdominals Supine trunk rotation at shoulder at 90 degrees shoulder flexion Down training: Diaphragmatic breathing with opening the rib cage then having the air travel to the abdomen      PATIENT EDUCATION:  09/23/24 Education details: Access Code: [email or web address], scar massage Person educated: Patient Education method: Explanation, Demonstration, Tactile cues, Verbal cues, and Handouts Education comprehension: verbalized understanding, returned demonstration, verbal cues required, tactile cues required, and needs further education  HOME EXERCISE PROGRAM: 09/23/24 Access Code: [email or web address] URL: [email or web address] Date: 09/23/2024 Prepared by: Channing Pereyra  Exercises - Supine Diaphragmatic Breathing  - 2 x daily - 7 x weekly - 1 sets - 10 reps - Seated Diaphragmatic Breathing  - 2 x daily - 7 x weekly - 1 sets - 10 reps - Shoulder Extension with Resistance - 90/90 on Swiss Ball  - 1 x daily - 7 x weekly - 1 sets  - 10 reps - Hooklying Anti-Rotation Press With Anchored Resistance  - 1 x daily - 7 x weekly - 1 sets - 10 reps - Hooklying Transversus Abdominis Palpation  - 1 x daily - 7 x weekly - 1 sets - 10 reps - Standing Shoulder Extension with Resistance  - 1 x daily - 3 x weekly - 1 sets -  10 reps - Standing Diagonal Shoulder Extension with Anchored Resistance  - 1 x daily - 3 x weekly - 1 sets - 10 reps - Standing Shoulder Flexion with Posterior Anchored Resistance  - 1 x daily - 3 x weekly - 1 sets - 10 reps  Patient Education - Abdominal Massage for Constipation - Abdominal Massage for Constipation   ASSESSMENT:  CLINICAL IMPRESSION: Patient is a 58 y.o. female who was seen today for physical therapy  treatment for high toned pelvic floor, urinary and fecal incontinence. Patient had increased mobility of her scar and diaphragm. Patient is able to urinate with greater ease. She is standing upright more often. She has had several small bowel movements since last visit and it is getting easier to pass the stool. Her vaginal tissue is dry and very uncomfortable with manual work os the therapist goes very slowly. Patient will benefit from skilled therapy to improve pelvic floor muscle excursion, reduce pain and coordination to help with continence and toileting.   OBJECTIVE IMPAIRMENTS: decreased activity tolerance, decreased coordination, decreased strength, increased fascial restrictions, increased muscle spasms, and pain.   ACTIVITY LIMITATIONS: sleeping, continence, and toileting  PARTICIPATION LIMITATIONS: meal prep, cleaning, laundry, interpersonal relationship, shopping, and community activity  PERSONAL FACTORS: Cholecystectomy; appendectomy; back surgery with spinal cored stimulator; Roux-EN-Y gastric bypass are also affecting patient's functional outcome.   REHAB POTENTIAL: Excellent  CLINICAL DECISION MAKING: Unstable/unpredictable  EVALUATION COMPLEXITY: High   GOALS: Goals  reviewed with patient? Yes  SHORT TERM GOALS: Target date: 09/23/24  Patient educated on abdominal massage to assist with peristalic motion of the intestines.  Baseline: Goal status: Met 09/02/24  2.  Patient educated on scar mobilization to improve mobility and reduce pain.  Baseline:  Goal status: Met 09/02/24  3.  Patient is able to perform diaphragmatic breathing to lengthen the pelvic floor to urinate and have a bowel movement.  Baseline:  Goal status: Met 09/09/24  4.  Patient educated on correct ways to have a bowel movement to reduce straining.  Baseline:  Goal status: Met 09/16/24  LONG TERM GOALS: Target date: 11/18/24  Patient independent with advanced HEP for core and pelvic floor strength to improve continence.  Baseline:  Goal status: INITIAL  2.  Patient is able to sit on commode and urinate within 15 seconds due to the ability to relax her pelvic floor.  Baseline:  Goal status: INITIAL  3.  Patient is able to sit on the commode to have a bowel movement and not strain 50% of the time due to the ability to relax the pelvic floor.  Baseline:  Goal status: INITIAL  4.  Patient has increased in lower abdominal scar to reduce her lower abdominal pain </= 2/10.  Baseline:  Goal status: INITIAL  5.  Patient reports her urinary leakage decreased </= 70% due to the ability to fully relax the pelvic floor and contract and reduction of trigger points.  Baseline:  Goal status: INITIAL   PLAN:  PT FREQUENCY: 1-2x/week  PT DURATION: 12 weeks  PLANNED INTERVENTIONS: 97110-Therapeutic exercises, 97530- Therapeutic activity, [id number]- Neuromuscular re-education, 97535- Self Care, [id number]- Manual therapy, 20560 (1-2 muscles), 20561 (3+ muscles)- Dry Needling, Patient/Family education, Cryotherapy, Moist heat, and Biofeedback  PLAN FOR NEXT SESSION:  manual work on the outside of the vagina on the right, core exercises; ask about lower abdominal pain.   Channing Pereyra,  PT 09/23/24 12:53 PM

## 2024-09-29 DIAGNOSIS — M5416 Radiculopathy, lumbar region: Secondary | ICD-10-CM | POA: Diagnosis not present

## 2024-10-03 ENCOUNTER — Encounter: Payer: Self-pay | Admitting: Obstetrics and Gynecology

## 2024-10-04 ENCOUNTER — Other Ambulatory Visit (HOSPITAL_BASED_OUTPATIENT_CLINIC_OR_DEPARTMENT_OTHER): Payer: Self-pay

## 2024-10-04 ENCOUNTER — Other Ambulatory Visit (HOSPITAL_COMMUNITY): Payer: Self-pay

## 2024-10-04 ENCOUNTER — Encounter: Payer: Self-pay | Admitting: Gastroenterology

## 2024-10-04 DIAGNOSIS — R3911 Hesitancy of micturition: Secondary | ICD-10-CM | POA: Diagnosis not present

## 2024-10-04 DIAGNOSIS — Z79891 Long term (current) use of opiate analgesic: Secondary | ICD-10-CM | POA: Diagnosis not present

## 2024-10-04 DIAGNOSIS — F322 Major depressive disorder, single episode, severe without psychotic features: Secondary | ICD-10-CM | POA: Diagnosis not present

## 2024-10-04 DIAGNOSIS — G4710 Hypersomnia, unspecified: Secondary | ICD-10-CM | POA: Diagnosis not present

## 2024-10-04 DIAGNOSIS — K5909 Other constipation: Secondary | ICD-10-CM | POA: Diagnosis not present

## 2024-10-04 DIAGNOSIS — K219 Gastro-esophageal reflux disease without esophagitis: Secondary | ICD-10-CM | POA: Diagnosis not present

## 2024-10-04 DIAGNOSIS — E039 Hypothyroidism, unspecified: Secondary | ICD-10-CM | POA: Diagnosis not present

## 2024-10-04 DIAGNOSIS — Z1331 Encounter for screening for depression: Secondary | ICD-10-CM | POA: Diagnosis not present

## 2024-10-04 DIAGNOSIS — N390 Urinary tract infection, site not specified: Secondary | ICD-10-CM | POA: Diagnosis not present

## 2024-10-04 DIAGNOSIS — R600 Localized edema: Secondary | ICD-10-CM | POA: Diagnosis not present

## 2024-10-04 DIAGNOSIS — Z0000 Encounter for general adult medical examination without abnormal findings: Secondary | ICD-10-CM | POA: Diagnosis not present

## 2024-10-05 ENCOUNTER — Encounter: Payer: Self-pay | Admitting: Physical Therapy

## 2024-10-05 ENCOUNTER — Encounter: Payer: Self-pay | Attending: Obstetrics and Gynecology | Admitting: Physical Therapy

## 2024-10-05 DIAGNOSIS — R102 Pelvic and perineal pain unspecified side: Secondary | ICD-10-CM | POA: Diagnosis not present

## 2024-10-05 DIAGNOSIS — R278 Other lack of coordination: Secondary | ICD-10-CM | POA: Diagnosis not present

## 2024-10-05 DIAGNOSIS — L905 Scar conditions and fibrosis of skin: Secondary | ICD-10-CM | POA: Diagnosis not present

## 2024-10-05 DIAGNOSIS — M6281 Muscle weakness (generalized): Secondary | ICD-10-CM | POA: Insufficient documentation

## 2024-10-05 NOTE — Therapy (Signed)
 OUTPATIENT PHYSICAL THERAPY FEMALE PELVIC TREATMENT   Patient Name: Paige [name] MRN: [id number] DOB:08/23/1966, 58 y.o., female Today's Date: 10/05/2024  END OF SESSION:  PT End of Session - 10/05/24 1307     Visit Number 6    Date for Recertification  11/18/24    Authorization Type [employer] medicare    Authorization - Visit Number 6    Authorization - Number of Visits 10    PT Start Time 1300    PT Stop Time 1345    PT Time Calculation (min) 45 min    Activity Tolerance Patient tolerated treatment well    Behavior During Therapy WFL for tasks assessed/performed          Past Medical History:  Diagnosis Date   Allergic rhinitis    Allergy    Anemia    Anxiety disorder    Back pain    Bilateral swelling of feet    Bronchitis    Cancer (HCC)    skin   Chronic fatigue    Chronic fatigue    Chronic headaches    Chronic pain    Constipation    Depression    Endometriosis    Family history of colon cancer    mother,uncles,aunts   Fibromyalgia    Fibromyalgia    Gallbladder problem    GERD (gastroesophageal reflux disease)    Hyperlipidemia    Hypothyroidism    IBS (irritable bowel syndrome)    Insomnia    Iron  deficiency    Joint pain    Laryngopharyngeal reflux (LPR)    Obesity    Osteoarthritis    Personal history of colonic polyps 04/2006   polypoid   PONV (postoperative nausea and vomiting)    RLS (restless legs syndrome)    Sleep apnea    no cpap   SOB (shortness of breath)    Swelling    Vitamin B12 deficiency    Vitamin D  deficiency    Past Surgical History:  Procedure Laterality Date   ANTERIOR CRUCIATE LIGAMENT REPAIR Left 2003   APPENDECTOMY  1993   BACK SURGERY  03/2024   spinal cord stimulator placed   CARPAL TUNNEL RELEASE Left 09/25/2017   Procedure: LEFT CARPAL TUNNEL RELEASE;  Surgeon: Murrell Kuba, MD;  Location: [facility];  Service: Orthopedics;  Laterality: Left;   CARPAL TUNNEL RELEASE Right     CHOLECYSTECTOMY  1999   COLONOSCOPY  2009   patterson   DE QUERVAIN'S RELEASE     LUMBAR LAMINECTOMY     L4- S1   PANNICULECTOMY     POLYPECTOMY     REPAIR ANKLE LIGAMENT Left 2001   ROUX-EN-Y GASTRIC BYPASS  04/2004   Dr Donnice Lunger   TONSILLECTOMY AND ADENOIDECTOMY  1976   TRIGGER FINGER RELEASE     Patient Active Problem List   Diagnosis Date Noted   High-tone pelvic floor dysfunction 05/18/2024   SUI (stress urinary incontinence, female) 05/18/2024   Overactive bladder 05/18/2024   Incontinence of feces 05/18/2024   Vaginal atrophy 05/18/2024   Chronic idiopathic constipation 05/18/2024   Nutritional deficiency 03/17/2024   OSA on CPAP 03/17/2024   Encounter for orthopedic follow-up care 01/31/2020   Pain of left hand 12/19/2018   Radial styloid tenosynovitis 12/19/2018   Trigger finger 12/19/2018   Abscess of postoperative wound of abdominal wall 10/05/2018   Pannus, abdominal 09/21/2018   Pigmented skin lesion 06/23/2018   Cervicalgia 02/12/2018   Bilateral carpal tunnel syndrome 08/13/2017  Primary osteoarthritis of both first carpometacarpal joints 08/13/2017   Muscle tension dysphonia 08/12/2016   Vocal cord atrophy 08/12/2016   Hoarseness 07/02/2016   Laryngopharyngeal reflux (LPR) 07/02/2016   Cough 05/23/2016   Dyspnea 05/23/2016   Chronic pain 05/22/2016   Sacrococcygeal disorders, not elsewhere classified 11/24/2015   Anemia, iron  deficiency 08/24/2015   Fibrositis 08/17/2015   Abnormal weight gain 08/17/2015   Anxiety 08/17/2015   Recurrent major depressive episodes 08/17/2015   History of migraine headaches 08/17/2015   Neurosis, posttraumatic 08/17/2015   Bariatric surgery status 08/17/2015   DDD (degenerative disc disease), lumbar 01/18/2015   Facet joint syndrome 01/18/2015   Thoracic spondylosis 01/18/2015   Myofascial pain 02/22/2013   Back ache 07/09/2012   Family history of malignant neoplasm of gastrointestinal tract 06/10/2011    History of colonic polyps 06/10/2011   Esophageal reflux 06/10/2011   Cellulitis 06/10/2011   History of Roux-en-Y gastric bypass 06/10/2011   B12 nutritional deficiency 06/10/2011   Morbid obesity due to excess calories (HCC) 06/10/2011   Personal history of fibromyalgia 06/10/2011   Recurrent major depressive disorder, in partial remission 06/10/2011   Acid reflux 06/10/2011    PCP: Viona Fitch, MD  REFERRING PROVIDER: Marilynne Rosaline SAILOR, MD   REFERRING DIAG:  M62.89 (ICD-10-CM) - High-tone pelvic floor dysfunction  N39.3 (ICD-10-CM) - SUI (stress urinary incontinence, female)  N32.81 (ICD-10-CM) - Overactive bladder  R15.9 (ICD-10-CM) - Incontinence of feces, unspecified fecal incontinence type    THERAPY DIAG:  Muscle weakness (generalized)  Other lack of coordination  Scar  Pelvic pain  Rationale for Evaluation and Treatment: Rehabilitation  ONSET DATE: 12/2023  SUBJECTIVE:                                                                                                                                                                                           SUBJECTIVE STATEMENT: I had to use an enema 2 times. Patient is able to urinate when she is ready. Sometimes I have to sit awhile to fully empty my bladder. Patient reports her urinary symptoms are 60% better.   Fluid intake:  100oz water, 1 small bottle diet dr pepper per day. Has an occasional coffee. Usually only has sips of water before bed.   FUNCTIONAL LIMITATIONS: Patient uses a stick for walking  PERTINENT HISTORY:  Surgeries: Cholecystectomy; appendectomy; back surgery with spinal cored stimulator; Roux-EN-Y gastric bypass Other: Skin Caner; Fibromyalgia; IBS; Hypothyroidism;    PAIN:  Are you having pain? Yes NPRS scale: 5/10 Pain location: lower abdominal area  Pain type: aching Pain description: constant   Aggravating factors: needing to have a bowel  movement and not able  to Relieving factors: heat at one tim  PRECAUTIONS: Other: skin cancer; spinal cord stimulator  RED FLAGS: None   WEIGHT BEARING RESTRICTIONS: No  FALLS:  Has patient fallen in last 6 months? No  OCCUPATION: disability  ACTIVITY LEVEL : Patient has stopped exercising since 9/1 due to back pain  PLOF: Independent  PATIENT GOALS: be able to urinate better, not have to use an enema to have a bowel movement, reduce pelvic pain   BOWEL MOVEMENT: Pain with bowel movement: Yes, 8-9/10 Type of bowel movement:0-4 time(s) per week ; strains; Type 1, hard rocks Fully empty rectum: No Leakage: Consistency with leakage: soft                                                      Caused by: small amounts weekly, usually after an incomplete BM  Pads: Yes: depends at night Fiber supplement/laxative stool softener (dulcolax 5mg  BID), Ibrelsa BID   URINATION: Pain with urination: Yes, burning of retention Fully empty bladder: Nonot able to urinate right away, sometimes needs to run water; she will wipe then urinate again                          Post-void dribble: No Stream: Weak Urgency: No Frequency:during the day  8+                                                           Nocturia: Yes:  2-6 times per night to void    10/05/24: wakes up 2 times Leakage: Coughing, Sneezing, Laughing, and without sensation Pads/briefs:  0-2 pads per day ; depends  at night  INTERCOURSE:  Ability to have vaginal penetration Yes  Pain with intercourse: Initial Penetration and Deep Penetration Dryness: Yes    PROLAPSE: None   OBJECTIVE:  Note: Objective measures were completed at Evaluation unless otherwise noted.  DIAGNOSTIC FINDINGS:  Pelvic floor strength I/V, puborectalis I/V external anal sphincter I/V   Pelvic floor musculature: Right levator tender, Right obturator tender, Left levator tender, Left obturator tender. High tone throughout Post Void Residual 10 mL      COGNITION: Overall  cognitive status: Within functional limits for tasks assessed     SENSATION: Light touch: Appears intact   GAIT: Assistive device utilized: walking stick  POSTURE: rounded shoulders, forward head, and decreased lumbar lordosis; Right shoulder higher; patient had scoliosos   LUMBARAROM/PROM:not tested due to fusion    LOWER EXTREMITY MNF:qloo bilateral hip ROM   LOWER EXTREMITY MMT:  MMT Right eval Left eval  Hip flexion 4/5 4/5  Hip extension 4/5 4/5  Hip abduction 3/5 3/5   (Blank rows = not tested) PALPATION:  Abdominal: tenderness of the lower abdomen; increased lower rib cage angle  Diastasis: No Distortion: No  Breathing: contract the abdominals and will raise the lower rib cage Scar tissue: Yes: along the lower abdomen, lower umbilicus,                 External Perineal Exam: redness along the posterior introitus, tenderness located on the perineal body and around  the rectum                             Internal Pelvic Floor: therapist was not able to get the index finger past the DIP joint into the vaginal canal due to pain; going through the rectum the puborectalis did not move forward well, tenderness throughout the levator ani and obturator internist and in the rectum.   Patient confirms identification and approves PT to assess internal pelvic floor and treatment Yes No emotional/communication barriers or cognitive limitation. Patient is motivated to learn. Patient understands and agrees with treatment goals and plan. PT explains patient will be examined in standing, sitting, and lying down to see how their muscles and joints work. When they are ready, they will be asked to remove their underwear so PT can examine their perineum. The patient is also given the option of providing their own chaperone as one is not provided in our facility. The patient also has the right and is explained the right to defer or refuse any part of the evaluation or treatment including the  internal exam. With the patient's consent, PT will use one gloved finger to gently assess the muscles of the pelvic floor, seeing how well it contracts and relaxes and if there is muscle symmetry. After, the patient will get dressed and PT and patient will discuss exam findings and plan of care. PT and patient discuss plan of care, schedule, attendance policy and HEP activities.   PELVIC MMT:   MMT eval 09/16/24 10/05/24  Vaginal 2/5 4/5   Internal Anal Sphincter 2/5  2/5  External Anal Sphincter 2/5  2/5  Puborectalis 2/5  2/5  (Blank rows = not tested)        TONE: Increased tone of the pelvic floor  PROLAPSE: none  TODAY'S TREATMENT:  10/05/24 Manual: Internal pelvic floor techniques: No emotional/communication barriers or cognitive limitation. Patient is motivated to learn. Patient understands and agrees with treatment goals and plan. PT explains patient will be examined in standing, sitting, and lying down to see how their muscles and joints work. When they are ready, they will be asked to remove their underwear so PT can examine their perineum. The patient is also given the option of providing their own chaperone as one is not provided in our facility. The patient also has the right and is explained the right to defer or refuse any part of the evaluation or treatment including the internal exam. With the patient's consent, PT will use one gloved finger to gently assess the muscles of the pelvic floor, seeing how well it contracts and relaxes and if there is muscle symmetry. After, the patient will get dressed and PT and patient will discuss exam findings and plan of care. PT and patient discuss plan of care, schedule, attendance policy and HEP activities.  Therapist gloved finger in the rectum and vaginal canal working on the perineal body, along the levator ani, obturator internist, one finger on the side of the bladder and other finger on the anococcygeal ligament, release from origin to  insertion of the muscles Externally working around the perineal body, along the external anal sphincter, levator ani, and obturator internist Neuromuscular re-education: Pelvic floor contraction training: Therapist gloved finger in the rectum with patient working on pushing the finger out of the rectum and not tightening instead. Patient was lying on her side. She needed cues to fill her abdomen up and use different breathing  out techniques.   09/23/24 Manual: Soft tissue mobilization: Manual work to the right hip adductors, levator ani, along the urogenital diaphragm, perineal body and superior transverse perineum Internal pelvic floor techniques: No emotional/communication barriers or cognitive limitation. Patient is motivated to learn. Patient understands and agrees with treatment goals and plan. PT explains patient will be examined in standing, sitting, and lying down to see how their muscles and joints work. When they are ready, they will be asked to remove their underwear so PT can examine their perineum. The patient is also given the option of providing their own chaperone as one is not provided in our facility. The patient also has the right and is explained the right to defer or refuse any part of the evaluation or treatment including the internal exam. With the patient's consent, PT will use one gloved finger to gently assess the muscles of the pelvic floor, seeing how well it contracts and relaxes and if there is muscle symmetry. After, the patient will get dressed and PT and patient will discuss exam findings and plan of care. PT and patient discuss plan of care, schedule, attendance policy and HEP activities.  Therapist gloved finger in the vaginal canal work on the right pelvic floor muscles going very slowly due to the tissue is thin and umcomfortable. There fascial release along the right side of the bladder, along the urethra, and right anterior vaginal wall to release the tissue and  restrictions.  Exercises: Strengthening: Standing bilateral shoulder extension with red band 15 x 2 Standing diagonal pulling down to opposite hip 15 x 2 Standing pushing red band forward from behind 15 x 2 Self-care: Discussed with patient on using her vaginal moisturizers to help with the discomfort with internal work    09/16/24 Manual: Soft tissue mobilization: Circular massage of the abdomen to promote peristalic motion Manual work to the hip adductors Scar tissue mobilization: Scar mobilization going through the restrictions working in different directions to improve mobility Using a suction cup to lift the scar up and improve movement Myofascial release: Fascial release of the urogenital diaphragm  Internal pelvic floor techniques: No emotional/communication barriers or cognitive limitation. Patient is motivated to learn. Patient understands and agrees with treatment goals and plan. PT explains patient will be examined in standing, sitting, and lying down to see how their muscles and joints work. When they are ready, they will be asked to remove their underwear so PT can examine their perineum. The patient is also given the option of providing their own chaperone as one is not provided in our facility. The patient also has the right and is explained the right to defer or refuse any part of the evaluation or treatment including the internal exam. With the patient's consent, PT will use one gloved finger to gently assess the muscles of the pelvic floor, seeing how well it contracts and relaxes and if there is muscle symmetry. After, the patient will get dressed and PT and patient will discuss exam findings and plan of care. PT and patient discuss plan of care, schedule, attendance policy and HEP activities.  Therapist gloved finger going through the vaginal canal working on the sides of the vaginal canal, along the pubovaginalis, urethra sphincter Therapist gloved finger going through the  vaginal canal releasing the area around the right side of the pelvic floor with other hand along the suprapubic area to work on the scar Neuromuscular re-education: Pelvic floor contraction training: Therapist gloved finger going into the vaginal canal working  on the pelvic floor contraction and feeling the lower abdominals contract Contract the pelvic floor in standing Contract the pelvic floor with cough or sneeze.        PATIENT EDUCATION:  09/23/24 Education details: Access Code: [email or web address], scar massage Person educated: Patient Education method: Explanation, Demonstration, Tactile cues, Verbal cues, and Handouts Education comprehension: verbalized understanding, returned demonstration, verbal cues required, tactile cues required, and needs further education  HOME EXERCISE PROGRAM: 09/23/24 Access Code: [email or web address] URL: [email or web address] Date: 09/23/2024 Prepared by: Channing Pereyra  Exercises - Supine Diaphragmatic Breathing  - 2 x daily - 7 x weekly - 1 sets - 10 reps - Seated Diaphragmatic Breathing  - 2 x daily - 7 x weekly - 1 sets - 10 reps - Shoulder Extension with Resistance - 90/90 on Swiss Ball  - 1 x daily - 7 x weekly - 1 sets - 10 reps - Hooklying Anti-Rotation Press With Anchored Resistance  - 1 x daily - 7 x weekly - 1 sets - 10 reps - Hooklying Transversus Abdominis Palpation  - 1 x daily - 7 x weekly - 1 sets - 10 reps - Standing Shoulder Extension with Resistance  - 1 x daily - 3 x weekly - 1 sets - 10 reps - Standing Diagonal Shoulder Extension with Anchored Resistance  - 1 x daily - 3 x weekly - 1 sets - 10 reps - Standing Shoulder Flexion with Posterior Anchored Resistance  - 1 x daily - 3 x weekly - 1 sets - 10 reps  Patient Education - Abdominal Massage for Constipation - Abdominal Massage for Constipation   ASSESSMENT:  CLINICAL IMPRESSION: Patient is a 58 y.o. female who was seen today for physical therapy  treatment for high toned pelvic  floor, urinary and fecal incontinence. Patient wakes up 2 times per night instead of 6 times. Patient reports her urinary symptoms are 60% better.  Patient is having difficulty with pushing stool out again. Rectal strength is 2/5. She has many trigger points in the pelvic floor and around the bladder. She has difficulty with pushing the therapist finger out of the rectum. Rectum felt more relaxed after the manual work. Patient will benefit from skilled therapy to improve pelvic floor muscle excursion, reduce pain and coordination to help with continence and toileting.   OBJECTIVE IMPAIRMENTS: decreased activity tolerance, decreased coordination, decreased strength, increased fascial restrictions, increased muscle spasms, and pain.   ACTIVITY LIMITATIONS: sleeping, continence, and toileting  PARTICIPATION LIMITATIONS: meal prep, cleaning, laundry, interpersonal relationship, shopping, and community activity  PERSONAL FACTORS: Cholecystectomy; appendectomy; back surgery with spinal cored stimulator; Roux-EN-Y gastric bypass are also affecting patient's functional outcome.   REHAB POTENTIAL: Excellent  CLINICAL DECISION MAKING: Unstable/unpredictable  EVALUATION COMPLEXITY: High   GOALS: Goals reviewed with patient? Yes  SHORT TERM GOALS: Target date: 09/23/24  Patient educated on abdominal massage to assist with peristalic motion of the intestines.  Baseline: Goal status: Met 09/02/24  2.  Patient educated on scar mobilization to improve mobility and reduce pain.  Baseline:  Goal status: Met 09/02/24  3.  Patient is able to perform diaphragmatic breathing to lengthen the pelvic floor to urinate and have a bowel movement.  Baseline:  Goal status: Met 09/09/24  4.  Patient educated on correct ways to have a bowel movement to reduce straining.  Baseline:  Goal status: Met 09/16/24  LONG TERM GOALS: Target date: 11/18/24  Patient independent with advanced HEP for core and pelvic floor  strength to improve continence.  Baseline:  Goal status: INITIAL  2.  Patient is able to sit on commode and urinate within 15 seconds due to the ability to relax her pelvic floor.  Baseline:  Goal status: INITIAL  3.  Patient is able to sit on the commode to have a bowel movement and not strain 50% of the time due to the ability to relax the pelvic floor.  Baseline:  Goal status: INITIAL  4.  Patient has increased in lower abdominal scar to reduce her lower abdominal pain </= 2/10.  Baseline:  Goal status: INITIAL  5.  Patient reports her urinary leakage decreased </= 70% due to the ability to fully relax the pelvic floor and contract and reduction of trigger points.  Baseline: urinary leakage decreased by 60% Goal status: INITIAL   PLAN:  PT FREQUENCY: 1-2x/week  PT DURATION: 12 weeks  PLANNED INTERVENTIONS: 97110-Therapeutic exercises, 97530- Therapeutic activity, [id number]- Neuromuscular re-education, 97535- Self Care, [id number]- Manual therapy, 20560 (1-2 muscles), 20561 (3+ muscles)- Dry Needling, Patient/Family education, Cryotherapy, Moist heat, and Biofeedback  PLAN FOR NEXT SESSION:  manual work on the outside of the vagina on the right, core exercises; ask about lower abdominal pain.   Channing Pereyra, PT 10/05/24 1:59 PM

## 2024-10-06 ENCOUNTER — Encounter (INDEPENDENT_AMBULATORY_CARE_PROVIDER_SITE_OTHER): Payer: Self-pay | Admitting: Family Medicine

## 2024-10-06 ENCOUNTER — Ambulatory Visit (INDEPENDENT_AMBULATORY_CARE_PROVIDER_SITE_OTHER): Payer: Self-pay | Admitting: Family Medicine

## 2024-10-06 VITALS — BP 102/70 | HR 84 | Temp 97.4°F | Ht 67.0 in | Wt 223.0 lb

## 2024-10-06 DIAGNOSIS — G8929 Other chronic pain: Secondary | ICD-10-CM

## 2024-10-06 DIAGNOSIS — E88819 Insulin resistance, unspecified: Secondary | ICD-10-CM | POA: Diagnosis not present

## 2024-10-06 DIAGNOSIS — M549 Dorsalgia, unspecified: Secondary | ICD-10-CM | POA: Diagnosis not present

## 2024-10-06 DIAGNOSIS — Z6836 Body mass index (BMI) 36.0-36.9, adult: Secondary | ICD-10-CM

## 2024-10-06 DIAGNOSIS — Z6838 Body mass index (BMI) 38.0-38.9, adult: Secondary | ICD-10-CM

## 2024-10-06 MED ORDER — METFORMIN HCL 500 MG PO TABS: ORAL_TABLET | ORAL | 0 refills | Status: DC

## 2024-10-06 NOTE — Progress Notes (Signed)
 Paige [name], D.O.  ABFM, ABOM Specializing in Clinical Bariatric Medicine  Office located at: [address] [place], [place]  [address]    FOR THE CHRONIC DISEASE OF OBESITY:   Morbid obesity (HCC) Starting BMI 40.57 BMI 38.0-38.9,adult current 36.17  Weight Summary and Body Composition Analysis  Weight Lost Since Last Visit: 8lb  Weight Gained Since Last Visit: 0lb    Vitals Temp: (!) 97.4 F (36.3 C) BP: 102/70 Pulse Rate: 84 SpO2: 96 %   Anthropometric Measurements Height: 5' 7 (1.702 m) Weight: 223 lb (101.2 kg) BMI (Calculated): 34.92 Weight at Last Visit: 231lb Weight Lost Since Last Visit: 8lb Weight Gained Since Last Visit: 0lb Starting Weight: 259lb Total Weight Loss (lbs): 36 lb (16.3 kg) Peak Weight: 316lb   Body Composition  Body Fat %: 48.5 % Fat Mass (lbs): 108.4 lbs Muscle Mass (lbs): 109.2 lbs Total Body Water (lbs): 85.8 lbs Visceral Fat Rating : 14   Other Clinical Data Fasting: No Labs: no Today's Visit #: 7 Starting Date: 05/25/24    Chief complaint: Obesity  Interval History Paige [name] is here for a follow-up office visit to discuss her progress with her obesity treatment plan. She is keeping a food journal and adhering to recommended goals of 1000 1100 calories and 80+ grams protein and states she is following her eating plan approximately 80 % of the time. She is doing yoga and stretching 20  minutes 5 days per week  She has experienced a weight loss of 8 lbs since last OV on 09/08/2024.   Her dietary and life habits include:  - Tracking Calories/Macros: yes  - Eating More Whole Foods: yes  - Adequate Protein Intake: yes  - Adequate Water Intake: no  - Skipping Meals: no  - Sleeping 7-9 Hours/ Night: yes    09/08/24 14:00 10/06/24 11:00   Body Fat % 49.9 % 48.5 %  Muscle Mass (lbs) 110.2 lbs 109.2 lbs  Fat Mass (lbs) 115.4 lbs 108.4 lbs  Total Body Water (lbs) 0 lbs 85.8 lbs  Visceral Fat  Rating  14 14   Counseling done on how various foods will affect these numbers and how to maximize success  Total Fat mass lost to date: - 25.8 lbs Total lbs lost to date: - 36 lbs Total weight loss percentage to date: -13.90 %   Nutritional and Behavioral Counseling:  We discussed the following today: continue to work on maintaining a reduced calorie state, getting the recommended amount of protein, incorporating whole foods, making healthy choices, staying well hydrated and practicing mindfulness when eating.  Additional resources provided today: Handout on NEAT  Evidence-based interventions for health behavior change were utilized today including the discussion of self monitoring techniques, problem-solving barriers and SMART goal setting techniques.   Regarding patient's less desirable eating habits and patterns, we employed the technique of small changes.   SMART Goal(s) created today: n/a   Recommended Dietary Goals [name] is currently in the action stage of change. As such, her goal is to continue weight management plan.  She has agreed to continue journaling 1000-1100 cal and 80+ grams protein.    Recommended Physical Activity Goals [name] has been advised to work up to 300-450 minutes of moderate intensity aerobic activity a week and strengthening exercises 2-3 times per week for cardiovascular health, weight loss maintenance and preservation of muscle mass.   She may Continue to gradually increase the amount and intensity of exercise routine   Medical  Interventions and Pharmacotherapy Previous Bariatric surgery: History of Roux-en-Y gastric bypass done in 2005 by Dr.Martin at [facility]. Takes bariatric fusion with iron .   Pharmacotherapy: Cont Metformin  at same dose.     OBESITY RELATED CONDITIONS ADDRESSED TODAY:    Medications Discontinued During This Encounter  Medication Reason   metFORMIN  (GLUCOPHAGE ) 500 MG tablet Reorder     Meds ordered this  encounter  Medications   metFORMIN  (GLUCOPHAGE ) 500 MG tablet    Sig: 1 po TID with food    Dispense:  90 tablet    Refill:  0    30 d supply;  ** OV for RF **   Do not send RF request     Insulin  resistance Assessment & Plan: Lab Results  Component Value Date   HGBA1C 5.5 05/25/2024   INSULIN  5.4 05/25/2024    On Metformin  500 mg TID with reported good compliance and tolerance. Denies GI side effects. No complaints of excessive hunger or cravings. Cont Metformin  at current dose (refill today). Cont working on nutrition plan to decrease simple carbohydrates, increase lean proteins and exercise to promote weight loss.     Chronic back pain, unspecified back location, unspecified back pain laterality Assessment & Plan: Patient was recently started on gabapentin  300 mg three times daily by her spine and scoliosis specialist. Since initiating this dose, she reports feeling "spacey," dissociated, tired, and "weird." She has not taken gabapentin  in the past 24 hours. Advised patient to continue holding off the medication and to contact her specialist for further guidance regarding medication management.    Objective:   PHYSICAL EXAM: Blood pressure 102/70, pulse 84, temperature (!) 97.4 F (36.3 C), height 5' 7 (1.702 m), weight 223 lb (101.2 kg), SpO2 96%. Body mass index is 34.93 kg/m.  General: she is overweight, cooperative and in no acute distress. PSYCH: Has normal mood, affect and thought process.   HEENT: EOMI, sclerae are anicteric. Lungs: Normal breathing effort, no conversational dyspnea. Extremities: Moves * 4 Neurologic: A and O * 3, good insight  DIAGNOSTIC DATA REVIEWED: BMET    Component Value Date/Time   NA 141 05/25/2024 1214   K 4.2 05/25/2024 1214   CL 99 05/25/2024 1214   CO2 27 05/25/2024 1214   GLUCOSE 95 05/25/2024 1214   GLUCOSE 97 05/26/2019 1046   BUN 16 05/25/2024 1214   CREATININE 0.86 05/25/2024 1214   CALCIUM 9.2 05/25/2024 1214   Lab  Results  Component Value Date   HGBA1C 5.5 05/25/2024   Lab Results  Component Value Date   INSULIN  5.4 05/25/2024   Lab Results  Component Value Date   TSH 1.430 05/25/2024   CBC    Component Value Date/Time   WBC 7.0 05/25/2024 1214   WBC 6.9 05/26/2019 1046   RBC 4.39 05/25/2024 1214   RBC 4.42 05/26/2019 1046   HGB 13.4 05/25/2024 1214   HCT 42.3 05/25/2024 1214   PLT 213 05/25/2024 1214   MCV 96 05/25/2024 1214   MCH 30.5 05/25/2024 1214   MCHC 31.7 05/25/2024 1214   MCHC 32.5 05/26/2019 1046   RDW 12.9 05/25/2024 1214   Iron  Studies    Component Value Date/Time   IRON  94 05/25/2024 1214   TIBC 334 05/25/2024 1214   FERRITIN 49 05/25/2024 1214   IRONPCTSAT 28 05/25/2024 1214   Lipid Panel     Component Value Date/Time   CHOL 170 05/25/2024 1214   TRIG 61 05/25/2024 1214   HDL 64 05/25/2024 1214  CHOLHDL 2.7 05/25/2024 1214   LDLCALC 94 05/25/2024 1214   Hepatic Function Panel     Component Value Date/Time   PROT 6.3 05/25/2024 1214   ALBUMIN 4.2 05/25/2024 1214   AST 32 05/25/2024 1214   ALT 38 (H) 05/25/2024 1214   ALKPHOS 103 05/25/2024 1214   BILITOT 0.3 05/25/2024 1214   BILIDIR 0.1 05/12/2017 1222      Component Value Date/Time   TSH 1.430 05/25/2024 1214   Nutritional Lab Results  Component Value Date   VD25OH 81.5 05/25/2024     Follow up:   Return 11/02/2024 at 2:20 PM.  She was informed of the importance of frequent follow up visits to maximize her success with intensive lifestyle modifications for her multiple health conditions.   Attestations:   I, Special Puri, acting as a stage manager for [employer], DO., have compiled all relevant documentation for today's office visit on behalf of Paige Jenkins, DO, while in the presence of [employer], DO.  Pertinent positives were addressed with patient today. Reviewed by clinician on day of visit: allergies, medications, problem list, medical history, surgical history,  family history, social history, and previous encounter notes.  I have reviewed the above documentation for accuracy and completeness, and I agree with the above. Paige JINNY [name], D.O.  The 21st Century Cures Act was signed into law in 2016 which includes the topic of electronic health records.  This provides immediate access to information in MyChart. This includes consultation notes, operative notes, office notes, lab results and pathology reports.  If you have any questions about what you read please let us  know at your next visit so we can discuss your concerns and take corrective action if need be.  We are right here with you.

## 2024-10-08 ENCOUNTER — Other Ambulatory Visit: Payer: Self-pay

## 2024-10-08 DIAGNOSIS — M6289 Other specified disorders of muscle: Secondary | ICD-10-CM

## 2024-10-10 ENCOUNTER — Other Ambulatory Visit: Payer: Self-pay

## 2024-10-10 DIAGNOSIS — F331 Major depressive disorder, recurrent, moderate: Secondary | ICD-10-CM

## 2024-10-10 DIAGNOSIS — F39 Unspecified mood [affective] disorder: Secondary | ICD-10-CM

## 2024-10-10 MED ORDER — CARIPRAZINE HCL 3 MG PO CAPS
3.0000 mg | ORAL_CAPSULE | Freq: Every day | ORAL | 0 refills | Status: DC
Start: 2024-10-10 — End: 2024-10-20

## 2024-10-11 ENCOUNTER — Ambulatory Visit: Admitting: Gastroenterology

## 2024-10-11 ENCOUNTER — Encounter: Payer: Self-pay | Admitting: Gastroenterology

## 2024-10-11 VITALS — BP 127/76 | HR 57 | Temp 97.2°F | Resp 16 | Ht 67.0 in | Wt 230.0 lb

## 2024-10-11 DIAGNOSIS — K581 Irritable bowel syndrome with constipation: Secondary | ICD-10-CM | POA: Diagnosis not present

## 2024-10-11 DIAGNOSIS — Z860101 Personal history of adenomatous and serrated colon polyps: Secondary | ICD-10-CM

## 2024-10-11 DIAGNOSIS — K621 Rectal polyp: Secondary | ICD-10-CM

## 2024-10-11 DIAGNOSIS — Z1211 Encounter for screening for malignant neoplasm of colon: Secondary | ICD-10-CM | POA: Diagnosis not present

## 2024-10-11 DIAGNOSIS — K6289 Other specified diseases of anus and rectum: Secondary | ICD-10-CM

## 2024-10-11 DIAGNOSIS — Z860100 Personal history of colon polyps, unspecified: Secondary | ICD-10-CM

## 2024-10-11 DIAGNOSIS — D175 Benign lipomatous neoplasm of intra-abdominal organs: Secondary | ICD-10-CM

## 2024-10-11 MED ORDER — DEXTROSE 5 % IV SOLN: INTRAVENOUS | Status: AC

## 2024-10-11 MED ORDER — NA SULFATE-K SULFATE-MG SULF 17.5-3.13-1.6 GM/177ML PO SOLN: 1.0000 | Freq: Once | ORAL | 0 refills | Status: AC

## 2024-10-11 MED ORDER — SODIUM CHLORIDE 0.9 % IV SOLN: 500.0000 mL | INTRAVENOUS | Status: DC

## 2024-10-11 NOTE — Progress Notes (Signed)
 Called to room to assist during endoscopic procedure.  Patient ID and intended procedure confirmed with present staff. Received instructions for my participation in the procedure from the performing physician.

## 2024-10-11 NOTE — Progress Notes (Signed)
 Report given to PACU, vss

## 2024-10-11 NOTE — Patient Instructions (Addendum)
 Preparation of the colon was poor. Clear liquid diet today and additional prep this evening. Repeat colonoscopy tomorrow. Await pathology results.   YOU HAD AN ENDOSCOPIC PROCEDURE TODAY AT THE [facility]:   Refer to the procedure report that was given to you for any specific questions about what was found during the examination.  If the procedure report does not answer your questions, please call your gastroenterologist to clarify.  If you requested that your care partner not be given the details of your procedure findings, then the procedure report has been included in a sealed envelope for you to review at your convenience later.  YOU SHOULD EXPECT: Some feelings of bloating in the abdomen. Passage of more gas than usual.  Walking can help get rid of the air that was put into your GI tract during the procedure and reduce the bloating. If you had a lower endoscopy (such as a colonoscopy or flexible sigmoidoscopy) you may notice spotting of blood in your stool or on the toilet paper. If you underwent a bowel prep for your procedure, you may not have a normal bowel movement for a few days.  Please Note:  You might notice some irritation and congestion in your nose or some drainage.  This is from the oxygen  used during your procedure.  There is no need for concern and it should clear up in a day or so.  SYMPTOMS TO REPORT IMMEDIATELY:  Following lower endoscopy (colonoscopy or flexible sigmoidoscopy):  Excessive amounts of blood in the stool  Significant tenderness or worsening of abdominal pains  Swelling of the abdomen that is new, acute  Fever of 100F or higher   For urgent or emergent issues, a gastroenterologist can be reached at any hour by calling [phone]. Do not use MyChart messaging for urgent concerns.    DIET: Clear liquid diet today and additional prep this evening.  ACTIVITY:  You should plan to take it easy for the rest of today and you should NOT DRIVE  or use heavy machinery until tomorrow (because of the sedation medicines used during the test).    FOLLOW UP: Our staff will call the number listed on your records the next business day following your procedure.  We will call around 7:15- 8:00 am to check on you and address any questions or concerns that you may have regarding the information given to you following your procedure. If we do not reach you, we will leave a message.     If any biopsies were taken you will be contacted by phone or by letter within the next 1-3 weeks.  Please call us  at [phone] if you have not heard about the biopsies in 3 weeks.    SIGNATURES/CONFIDENTIALITY: You and/or your care partner have signed paperwork which will be entered into your electronic medical record.  These signatures attest to the fact that that the information above on your After Visit Summary has been reviewed and is understood.  Full responsibility of the confidentiality of this discharge information lies with you and/or your care-partner.

## 2024-10-11 NOTE — Op Note (Signed)
 [facility] Patient Name: Paige [name] Procedure Date: 10/11/2024 2:20 PM MRN: [id number] Endoscopist: Sandor Flatter , MD, [id number] Age: 58 Referring MD:  Date of Birth: [birth year] Gender: Female Account #: [id number] Procedure:                Colonoscopy Indications:              Surveillance: Personal history of adenomatous                            polyps on last colonoscopy 3 years ago                           Last colonoscopy was 10/2021 and notable for 4                            polyps ranging 7-10 mm (path: SSP x 1, adenoma x 2,                            hyperplastic polyp x 1), transverse colon lipoma,                            with recommendation to repeat in 3 years.                           Separately, history of constipation and was                            recently prescribed Linzess along with lactulose. Medicines:                Monitored Anesthesia Care Procedure:                Pre-Anesthesia Assessment:                           - Prior to the procedure, a History and Physical                            was performed, and patient medications and                            allergies were reviewed. The patient's tolerance of                            previous anesthesia was also reviewed. The risks                            and benefits of the procedure and the sedation                            options and risks were discussed with the patient.                            All questions were answered, and informed consent  was obtained. Prior Anticoagulants: The patient has                            taken no anticoagulant or antiplatelet agents. ASA                            Grade Assessment: II - A patient with mild systemic                            disease. After reviewing the risks and benefits,                            the patient was deemed in satisfactory condition to                             undergo the procedure.                           After obtaining informed consent, the colonoscope                            was passed under direct vision. Throughout the                            procedure, the patient's blood pressure, pulse, and                            oxygen  saturations were monitored continuously. The                            CF [id number] #[id number] was introduced through the anus                            and advanced to the the cecum, identified by                            appendiceal orifice and ileocecal valve. The                            colonoscopy was technically difficult and complex                            due to poor bowel prep. The patient tolerated the                            procedure well. The quality of the bowel                            preparation was poor. The ileocecal valve,                            appendiceal orifice, and rectum were photographed. Scope In: 2:38:30 PM Scope Out: 2:52:08 PM Scope Withdrawal Time: 0 hours 8 minutes 54 seconds  Total Procedure Duration: 0 hours 13 minutes 38 seconds  Findings:                 The perianal and digital rectal examinations were                            normal.                           A moderate amount of stool and solid food debris                            was found in the entire colon, interfering with                            visualization. Lavage of the area was performed                            using copious amounts of sterile water, resulting                            in incomplete clearance with fair visualization.                           There was a small lipoma, in the proximal                            transverse colon.                           One 5 mm submucosal nodule was found in the rectum.                            The overlying mucosa was normal appearing. The                            nodule was firm and semi-mobile when manipulated                             with the closed forceps with negative pillow sign.                            Tunnelled bite-on-bite biopsies were taken with a                            cold forceps for histology. Estimated blood loss                            was minimal.                           The retroflexed view of the distal rectum and anal                            verge was normal and showed  no anal or rectal                            abnormalities, albeit on limited views due to                            retained solid stool debris. Complications:            No immediate complications. Estimated Blood Loss:     Estimated blood loss was minimal. Impression:               - Preparation of the colon was poor.                           - Stool in the entire examined colon.                           - Small lipoma in the proximal transverse colon.                           - Submucosal nodule in the rectum. Biopsied.                           - The distal rectum and anal verge are normal on                            retroflexion view. Recommendation:           - Patient has a contact number available for                            emergencies. The signs and symptoms of potential                            delayed complications were discussed with the                            patient. Return to normal activities tomorrow.                            Written discharge instructions were provided to the                            patient.                           - Clear liquid diet today with additional prep this                            evening.                           - Repeat colonoscopy tomorrow.                           - Await pathology results. Sandor Flatter, MD 10/11/2024 3:01:28 PM

## 2024-10-11 NOTE — Progress Notes (Signed)
 GASTROENTEROLOGY PROCEDURE H&P NOTE   Primary Care Physician: Jefferey Fitch, MD    Reason for Procedure:  Colon polyp surveillance  Plan:    Colonoscopy  Patient is appropriate for endoscopic procedure(s) in the ambulatory ([facility]) setting.  The nature of the procedure, as well as the risks, benefits, and alternatives were carefully and thoroughly reviewed with the patient. Ample time for discussion and questions allowed. The patient understood, was satisfied, and agreed to proceed. I personally addressed all patient questions and concerns.     HPI: Paige [name] is a 58 y.o. female who presents for colonoscopy for ongoing colon polyp surveillance and colon cancer screening.   Patient is otherwise without complaints or active issues today.  Last colonoscopy was 10/2021 and notable for 4 polyps ranging 7-10 mm (path: SSP x 1, adenoma x 2, hyperplastic polyp x 1), transverse colon lipoma, with recommendation to repeat in 3 years.  Additionally, history of constipation with episodic fecal incontinence and seepage.  Is on chronic oxycodone  for back pain.  Most recently seen in the GI clinic on 09/01/2024 prescribed Linzess 290 mcg daily along with lactulose with continued pelvic PT.  Completed extended 2-day prep for procedure today.    Past Medical History:  Diagnosis Date   Allergic rhinitis    Allergy    Anemia    Anxiety disorder    Back pain    Bilateral swelling of feet    Bronchitis    Cancer (HCC)    skin   Chronic fatigue    Chronic fatigue    Chronic headaches    Chronic pain    Constipation    Depression    Endometriosis    Family history of colon cancer    mother,uncles,aunts   Fibromyalgia    Fibromyalgia    Gallbladder problem    GERD (gastroesophageal reflux disease)    Hyperlipidemia    Hypothyroidism    IBS (irritable bowel syndrome)    Insomnia    Iron  deficiency    Joint pain    Laryngopharyngeal reflux (LPR)    Obesity     Osteoarthritis    Personal history of colonic polyps 04/2006   polypoid   PONV (postoperative nausea and vomiting)    RLS (restless legs syndrome)    Sleep apnea    no cpap   SOB (shortness of breath)    Swelling    Vitamin B12 deficiency    Vitamin D  deficiency     Past Surgical History:  Procedure Laterality Date   ANTERIOR CRUCIATE LIGAMENT REPAIR Left 2003   APPENDECTOMY  1993   BACK SURGERY  03/2024   spinal cord stimulator placed   CARPAL TUNNEL RELEASE Left 09/25/2017   Procedure: LEFT CARPAL TUNNEL RELEASE;  Surgeon: Murrell Kuba, MD;  Location: [facility];  Service: Orthopedics;  Laterality: Left;   CARPAL TUNNEL RELEASE Right    CHOLECYSTECTOMY  1999   COLONOSCOPY  2009   [facility]   DE QUERVAIN'S RELEASE     LUMBAR LAMINECTOMY     L4- S1   PANNICULECTOMY     POLYPECTOMY     REPAIR ANKLE LIGAMENT Left 2001   ROUX-EN-Y GASTRIC BYPASS  04/2004   Dr Donnice Lunger   TONSILLECTOMY AND ADENOIDECTOMY  1976   TRIGGER FINGER RELEASE      Prior to Admission medications   Medication Sig Start Date End Date Taking? Authorizing Provider  Albuterol-Budesonide (AIRSUPRA ) 90-80 MCG/ACT AERO Inhale 1 each into the lungs 2 (  two) times daily.    [provider]  buPROPion  (WELLBUTRIN  XL) 150 MG 24 hr tablet TAKE 3 TABLETS EVERY MORNING Patient taking differently: Take 150 mg by mouth daily. TAKE 1 TABLET EVERY MORNING 01/27/24   Mozingo, Regina Nattalie, NP  cariprazine  (VRAYLAR ) 3 MG capsule Take 1 capsule (3 mg total) by mouth daily. 10/10/24   Mozingo, Regina Nattalie, NP  clonazePAM  (KLONOPIN ) 0.5 MG tablet Take 1 tablet (0.5 mg total) by mouth 2 (two) times daily. 09/06/24   Mozingo, Regina Nattalie, NP  diazepam  (VALIUM ) 5 MG tablet Place 1 tablet vaginally nightly as needed for muscle spasm/ pelvic pain. Patient taking differently: Take 5 mg by mouth as needed. Place 1 tablet vaginally nightly as needed for muscle spasm/ pelvic pain. 05/18/24    Marilynne Rosaline SAILOR, MD  DULoxetine  (CYMBALTA ) 60 MG capsule Take 1 capsule (60 mg total) by mouth 2 (two) times daily. 12/16/23   Mozingo, Regina Nattalie, NP  Erenumab -aooe (AIMOVIG ) 70 MG/ML SOAJ INJECT 70 MG INTO THE SKIN EVERY 28 DAYS. 09/10/23   Skeet, Adam R, DO  estradiol  (ESTRACE ) 0.1 MG/GM vaginal cream Place 0.5 g vaginally 2 (two) times a week. Place 0.5g nightly for two weeks then twice a week after 06/03/24   Marilynne Rosaline SAILOR, MD  estradiol  (ESTRACE ) 0.5 MG tablet Take 0.5 mg by mouth daily. 01/23/24   [provider]  Fe Fum-Fe Poly-Vit C-Lactobac (FUSION) 65-65-25-30 MG CAPS Take by mouth.    [provider]  fluticasone (FLONASE SENSIMIST) 27.5 MCG/SPRAY nasal spray 2 sprays (1 spray in each nostril) Nasally Once a day during seasons of difficulty for 30 days 02/17/24   [provider]  gabapentin  (NEURONTIN ) 300 MG capsule Take 300 mg by mouth 3 (three) times daily. 09/02/24   [provider]  ibuprofen (ADVIL) 800 MG tablet Take 800 mg by mouth 3 (three) times daily. Patient taking differently: Take 800 mg by mouth every 12 (twelve) hours. 12/29/23   [provider]  ipratropium (ATROVENT) 0.06 % nasal spray Place 1 spray into both nostrils daily. 08/20/19   [provider]  lactulose (CHRONULAC) 10 GM/15ML solution TAKE 15 TO 30 ML AS DIRECTED UP TO 2 TIMES DAILY FOR CONSTIPATION 09/02/24   Collier, Amanda R, PA-C  levothyroxine  (SYNTHROID , LEVOTHROID) 88 MCG tablet Take 88 mcg by mouth daily before breakfast.    [provider]  linaclotide LARUE) 290 MCG CAPS capsule Take 1 capsule (290 mcg total) by mouth daily before breakfast. 09/01/24 11/30/24  Craig Alan SAUNDERS, PA-C  liothyronine (CYTOMEL) 5 MCG tablet Take 5 mcg by mouth daily. 07/27/21   [provider]  Magnesium Citrate (MAGNESIUM GUMMIES PO) Take 125 mg by mouth 2 (two) times daily.    [provider]  metFORMIN  (GLUCOPHAGE ) 500 MG tablet 1  po TID with food 10/06/24   Opalski, Deborah, DO  methylphenidate  (RITALIN ) 10 MG tablet Take 10 mg by mouth 2 (two) times daily. 08/19/24   [provider]  Multiple Vitamins-Minerals (MULTIVITAMIN GUMMIES ADULT PO) Take 1 each by mouth daily.    [provider]  nystatin cream (MYCOSTATIN) Apply 1 application  topically daily as needed for dry skin.    [provider]  Olopatadine HCl 0.2 % SOLN 1 drop into affected eye Ophthalmic Once a day or prn for 30 days 02/17/24   [provider]  omeprazole  (PRILOSEC) 20 MG capsule Take 20 mg by mouth 2 (two) times daily before a meal. 09/25/19  [provider]  ondansetron  (ZOFRAN -ODT) 4 MG disintegrating tablet Take 1 tablet (4 mg total) by mouth every 8 (eight) hours as needed for nausea or vomiting. 09/13/24   Skeet, Adam R, DO  Oxycodone  HCl 10 MG TABS Take 10 mg by mouth every 6 (six) hours as needed. Patient taking differently: Take 10 mg by mouth every 4 (four) hours. 01/10/20   [provider]  potassium chloride  (K-DUR,KLOR-CON ) 10 MEQ tablet Take 1 tablet by mouth daily. 03/04/18   [provider]  progesterone  (PROMETRIUM ) 100 MG capsule Take 100 mg by mouth daily. 01/28/24   [provider]  promethazine  (PHENERGAN ) 12.5 MG tablet Take 1 tablet (12.5 mg total) by mouth every 8 (eight) hours as needed for nausea or vomiting. 05/26/19   Aneita Gwendlyn DASEN, MD  Propylene Glycol (SYSTANE COMPLETE) 0.6 % SOLN Apply 2 drops to eye.    [provider]  rOPINIRole  (REQUIP ) 2 MG tablet Take 2 mg by mouth 2 (two) times daily.    [provider]  tiZANidine (ZANAFLEX) 4 MG tablet Take 4 mg by mouth 2 (two) times daily. 01/16/24   [provider]  torsemide (DEMADEX) 20 MG tablet Take 20 mg by mouth 2 (two) times daily. 12/22/19   [provider]  traZODone  (DESYREL ) 100 MG tablet Take 2 tablets (200 mg total) by mouth at bedtime. 12/16/23   Mozingo, Regina  Nattalie, NP  Ubrogepant  (UBRELVY ) 100 MG TABS Take 1 tablet (100 mg total) by mouth daily as needed (at onset of headaches may repeat dose after 2 hours as needed max 200 mg in 24 hours). 09/10/23   Skeet Juliene SAUNDERS, DO  Vitamin D -Vitamin K (VITAMIN K2-VITAMIN D3 PO) Take 1 each by mouth daily. Vitamin K2 100 mcg/Vitamin D  50 mcg per gummie    [provider]    Current Outpatient Medications  Medication Sig Dispense Refill   Albuterol-Budesonide (AIRSUPRA ) 90-80 MCG/ACT AERO Inhale 1 each into the lungs 2 (two) times daily.     buPROPion  (WELLBUTRIN  XL) 150 MG 24 hr tablet TAKE 3 TABLETS EVERY MORNING (Patient taking differently: Take 150 mg by mouth daily. TAKE 1 TABLET EVERY MORNING) 270 tablet 3   cariprazine  (VRAYLAR ) 3 MG capsule Take 1 capsule (3 mg total) by mouth daily. 90 capsule 0   clonazePAM  (KLONOPIN ) 0.5 MG tablet Take 1 tablet (0.5 mg total) by mouth 2 (two) times daily. 60 tablet 2   diazepam  (VALIUM ) 5 MG tablet Place 1 tablet vaginally nightly as needed for muscle spasm/ pelvic pain. (Patient taking differently: Take 5 mg by mouth as needed. Place 1 tablet vaginally nightly as needed for muscle spasm/ pelvic pain.) 30 tablet 0   DULoxetine  (CYMBALTA ) 60 MG capsule Take 1 capsule (60 mg total) by mouth 2 (two) times daily. 180 capsule 3   Erenumab -aooe (AIMOVIG ) 70 MG/ML SOAJ INJECT 70 MG INTO THE SKIN EVERY 28 DAYS. 1 mL 11   estradiol  (ESTRACE ) 0.1 MG/GM vaginal cream Place 0.5 g vaginally 2 (two) times a week. Place 0.5g nightly for two weeks then twice a week after 42.5 g 11   estradiol  (ESTRACE ) 0.5 MG tablet Take 0.5 mg by mouth daily.     Fe Fum-Fe Poly-Vit C-Lactobac (FUSION) 65-65-25-30 MG CAPS Take by mouth.     fluticasone (FLONASE SENSIMIST) 27.5 MCG/SPRAY nasal spray 2 sprays (1 spray in each nostril) Nasally Once a day during seasons of difficulty for 30 days     gabapentin  (NEURONTIN ) 300 MG capsule  Take 300 mg by mouth 3 (three) times daily.     ibuprofen  (ADVIL) 800 MG tablet Take 800 mg by mouth 3 (three) times daily. (Patient taking differently: Take 800 mg by mouth every 12 (twelve) hours.)     ipratropium (ATROVENT) 0.06 % nasal spray Place 1 spray into both nostrils daily.     lactulose (CHRONULAC) 10 GM/15ML solution TAKE 15 TO 30 ML AS DIRECTED UP TO 2 TIMES DAILY FOR CONSTIPATION 5400 mL 0   levothyroxine  (SYNTHROID , LEVOTHROID) 88 MCG tablet Take 88 mcg by mouth daily before breakfast.     linaclotide (LINZESS) 290 MCG CAPS capsule Take 1 capsule (290 mcg total) by mouth daily before breakfast. 90 capsule 0   liothyronine (CYTOMEL) 5 MCG tablet Take 5 mcg by mouth daily.     Magnesium Citrate (MAGNESIUM GUMMIES PO) Take 125 mg by mouth 2 (two) times daily.     metFORMIN  (GLUCOPHAGE ) 500 MG tablet 1 po TID with food 90 tablet 0   methylphenidate  (RITALIN ) 10 MG tablet Take 10 mg by mouth 2 (two) times daily.     Multiple Vitamins-Minerals (MULTIVITAMIN GUMMIES ADULT PO) Take 1 each by mouth daily.     nystatin cream (MYCOSTATIN) Apply 1 application  topically daily as needed for dry skin.     Olopatadine HCl 0.2 % SOLN 1 drop into affected eye Ophthalmic Once a day or prn for 30 days     omeprazole  (PRILOSEC) 20 MG capsule Take 20 mg by mouth 2 (two) times daily before a meal.     ondansetron  (ZOFRAN -ODT) 4 MG disintegrating tablet Take 1 tablet (4 mg total) by mouth every 8 (eight) hours as needed for nausea or vomiting. 20 tablet 11   Oxycodone  HCl 10 MG TABS Take 10 mg by mouth every 6 (six) hours as needed. (Patient taking differently: Take 10 mg by mouth every 4 (four) hours.)     potassium chloride  (K-DUR,KLOR-CON ) 10 MEQ tablet Take 1 tablet by mouth daily.     progesterone  (PROMETRIUM ) 100 MG capsule Take 100 mg by mouth daily.     promethazine  (PHENERGAN ) 12.5 MG tablet Take 1 tablet (12.5 mg total) by mouth every 8 (eight) hours as needed for nausea or vomiting. 30 tablet 0   Propylene Glycol (SYSTANE COMPLETE) 0.6 % SOLN Apply 2  drops to eye.     rOPINIRole  (REQUIP ) 2 MG tablet Take 2 mg by mouth 2 (two) times daily.     tiZANidine (ZANAFLEX) 4 MG tablet Take 4 mg by mouth 2 (two) times daily.     torsemide (DEMADEX) 20 MG tablet Take 20 mg by mouth 2 (two) times daily.     traZODone  (DESYREL ) 100 MG tablet Take 2 tablets (200 mg total) by mouth at bedtime. 180 tablet 3   Ubrogepant  (UBRELVY ) 100 MG TABS Take 1 tablet (100 mg total) by mouth daily as needed (at onset of headaches may repeat dose after 2 hours as needed max 200 mg in 24 hours). 16 tablet 11   Vitamin D -Vitamin K (VITAMIN K2-VITAMIN D3 PO) Take 1 each by mouth daily. Vitamin K2 100 mcg/Vitamin D  50 mcg per gummie     No current facility-administered medications for this visit.    Allergies as of 10/11/2024 - Review Complete 10/06/2024  Allergen Reaction Noted   Tape Dermatitis and Itching 10/16/2018   Topiramate  Other (See Comments) and Shortness Of Breath 04/21/2017   Cyclobenzaprine Other (See Comments) 06/10/2011   Flexeril [cyclobenzaprine hcl] Other (See Comments)  06/10/2011   Meloxicam Other (See Comments) 09/22/2017   Naltrexone Other (See Comments) 05/20/2019   Porcine (pork) protein Other (See Comments) 05/22/2016   Sumatriptan Other (See Comments) 06/10/2011   Wound dressing adhesive Other (See Comments) 03/02/2024   Brexpiprazole Other (See Comments) 03/02/2024   Glycopyrrolate  Other (See Comments) 03/01/2024   Imitrex [sumatriptan base] Other (See Comments) 06/10/2011   Lamotrigine  Other (See Comments) 03/01/2024   Sulfa antibiotics Other (See Comments) 11/06/2020    Family History  Problem Relation Age of Onset   Obesity Mother    Depression Mother    Cancer Mother    Heart disease Mother    Hyperlipidemia Mother    Hypertension Mother    Colon cancer Mother        uncles,aunts   Sleep apnea Father    Cancer Father    Diabetes Father        paternal grandmother,sister   Leukemia Father    Lymphoma Father    Skin cancer  Father    Heart disease Father    Colon polyps Sister    Diabetes Sister    Colon cancer Maternal Aunt    Colon cancer Maternal Uncle    Colon cancer Cousin    Colon polyps Cousin    Stomach cancer Neg Hx    Esophageal cancer Neg Hx    Rectal cancer Neg Hx     Social History   Socioeconomic History   Marital status: Significant Other    Spouse name: Leonce Ee   Number of children: 0   Years of education: Not on file   Highest education level: Not on file  Occupational History   Occupation: RN/disabled  Tobacco Use   Smoking status: Never   Smokeless tobacco: Never  Vaping Use   Vaping status: Never Used  Substance and Sexual Activity   Alcohol  use: Not Currently    Alcohol /week: 0.0 standard drinks of alcohol     Comment: rarely   Drug use: No   Sexual activity: Yes    Birth control/protection: Other-see comments  Other Topics Concern   Not on file  Social History Narrative   Patient is left-handed. She lives alone in a one level home. She has 3 cats. She is unable to exercise. Caffeine one soda / day. On disability now. 2 bachelor degrees   Social Drivers of Health   Financial Resource Strain: Low Risk  (04/06/2024)   Received from [facility]   Overall Financial Resource Strain (CARDIA)    Difficulty of Paying Living Expenses: Not hard at all  Food Insecurity: Low Risk  (08/05/2024)   Received from [facility]   Hunger Vital Sign    Within the past 12 months, you worried that your food would run out before you got money to buy more: Never true    Within the past 12 months, the food you bought just didn't last and you didn't have money to get more. : Never true  Transportation Needs: No Transportation Needs (08/05/2024)   Received from [facility]    In the past 12 months, has lack of reliable transportation kept you from medical appointments, meetings, work or from getting things needed for daily living? : No  Physical Activity: Not on file   Stress: Not on file  Social Connections: Not on file  Intimate Partner Violence: Not on file    Physical Exam: Vital signs in last 24 hours: @There  were no vitals taken for this visit. GEN: NAD  EYE: Sclerae anicteric ENT: MMM CV: Non-tachycardic Pulm: CTA b/l GI: Soft, NT/ND NEURO:  Alert & Oriented x 3   Sandor Flatter, DO [facility]   10/11/2024 1:11 PM

## 2024-10-12 ENCOUNTER — Encounter: Payer: Self-pay | Admitting: Physical Therapy

## 2024-10-12 ENCOUNTER — Encounter: Payer: Self-pay | Admitting: Internal Medicine

## 2024-10-12 ENCOUNTER — Ambulatory Visit: Admitting: Internal Medicine

## 2024-10-12 VITALS — BP 87/56 | HR 58 | Temp 97.7°F | Resp 20 | Ht 67.0 in | Wt 230.0 lb

## 2024-10-12 DIAGNOSIS — D124 Benign neoplasm of descending colon: Secondary | ICD-10-CM | POA: Diagnosis not present

## 2024-10-12 DIAGNOSIS — D125 Benign neoplasm of sigmoid colon: Secondary | ICD-10-CM

## 2024-10-12 DIAGNOSIS — K635 Polyp of colon: Secondary | ICD-10-CM | POA: Diagnosis not present

## 2024-10-12 DIAGNOSIS — K9189 Other postprocedural complications and disorders of digestive system: Secondary | ICD-10-CM | POA: Diagnosis not present

## 2024-10-12 DIAGNOSIS — K5903 Drug induced constipation: Secondary | ICD-10-CM

## 2024-10-12 DIAGNOSIS — K633 Ulcer of intestine: Secondary | ICD-10-CM | POA: Diagnosis not present

## 2024-10-12 DIAGNOSIS — Z1211 Encounter for screening for malignant neoplasm of colon: Secondary | ICD-10-CM

## 2024-10-12 DIAGNOSIS — D175 Benign lipomatous neoplasm of intra-abdominal organs: Secondary | ICD-10-CM

## 2024-10-12 DIAGNOSIS — Z860101 Personal history of adenomatous and serrated colon polyps: Secondary | ICD-10-CM

## 2024-10-12 DIAGNOSIS — Z860100 Personal history of colon polyps, unspecified: Secondary | ICD-10-CM

## 2024-10-12 DIAGNOSIS — Z800 Family history of malignant neoplasm of digestive organs: Secondary | ICD-10-CM | POA: Diagnosis not present

## 2024-10-12 DIAGNOSIS — K6289 Other specified diseases of anus and rectum: Secondary | ICD-10-CM

## 2024-10-12 DIAGNOSIS — K581 Irritable bowel syndrome with constipation: Secondary | ICD-10-CM

## 2024-10-12 MED ORDER — SODIUM CHLORIDE 0.9 % IV SOLN: 500.0000 mL | Freq: Once | INTRAVENOUS | Status: DC

## 2024-10-12 NOTE — Progress Notes (Signed)
 Patient with c/o cramping in recovery after colonoscopy procedure. Able to pass some gas after Levsin 0.25 mg was given and some light walking. Patient states pain is a 4/10 but would like to go home and get more comfortable there. Instructed patient to call back if her pain worsens, or is she has any concerns. Also instructed she can take some OTC Gas X for the cramping. Patient and care partner verbalized understanding. Dr. Albertus made aware.

## 2024-10-12 NOTE — Progress Notes (Signed)
 Pt's states no medical or surgical changes since previsit or office visit.

## 2024-10-12 NOTE — Progress Notes (Signed)
 Called to room to assist during endoscopic procedure.  Patient ID and intended procedure confirmed with present staff. Received instructions for my participation in the procedure from the performing physician.

## 2024-10-12 NOTE — Progress Notes (Signed)
 Report to PACU, RN, vss, BBS= Clear.

## 2024-10-12 NOTE — Op Note (Signed)
  [facility] Patient Name: Paige [name] Procedure Date: 10/12/2024 10:45 AM MRN: [id number] Endoscopist: Gordy CHRISTELLA Starch , MD, [id number] Age: 58 Referring MD:  Date of Birth: [birth year] Gender: Female Account #: [id number] Procedure:                Colonoscopy Indications:              High risk colon cancer surveillance: Personal                            history of multiple adenomas, Family history of                            colon cancer in first-degree relative and multiple                            second-degree relatives, Last colonoscopy: yesterday Medicines:                Monitored Anesthesia Care Procedure:                Pre-Anesthesia Assessment:                           - Prior to the procedure, a History and Physical                            was performed, and patient medications and                            allergies were reviewed. The patient's tolerance of                            previous anesthesia was also reviewed. The risks                            and benefits of the procedure and the sedation                            options and risks were discussed with the patient.                            All questions were answered, and informed consent                            was obtained. Prior Anticoagulants: The patient has                            taken no anticoagulant or antiplatelet agents. ASA                            Grade Assessment: III - A patient with severe                            systemic disease. After reviewing the risks and  benefits, the patient was deemed in satisfactory                            condition to undergo the procedure.                           After obtaining informed consent, the colonoscope                            was passed under direct vision. Throughout the                            procedure, the patient's blood pressure, pulse, and                             oxygen  saturations were monitored continuously. The                            CF [id number] #[id number] was introduced through the anus                            and advanced to the cecum, identified by                            appendiceal orifice and ileocecal valve. The                            colonoscopy was performed without difficulty. The                            patient tolerated the procedure well. The quality                            of the bowel preparation was fair (despite reprep                            with SuPrep after exam yesterday). The ileocecal                            valve, appendiceal orifice, and rectum were                            photographed. Scope In: 10:50:55 AM Scope Out: 11:12:38 AM Scope Withdrawal Time: 0 hours 16 minutes 47 seconds  Total Procedure Duration: 0 hours 21 minutes 43 seconds  Findings:                 The digital rectal exam was normal.                           There was a small lipoma, in the transverse colon.                           Two sessile polyps were found in the descending  colon. The polyps were 4 to 5 mm in size. These                            polyps were removed with a cold snare. Resection                            and retrieval were complete.                           A 7 mm polyp was found in the sigmoid colon. The                            polyp was sessile. The polyp was removed with a                            cold snare. Resection and retrieval were complete.                           A single erosion was found in the rectum from                            previous biopsy.                           The retroflexed view of the distal rectum and anal                            verge was normal and showed no anal or rectal                            abnormalities. Complications:            No immediate complications. Estimated Blood Loss:     Estimated blood loss was  minimal. Impression:               - Preparation of the colon was fair.                           - Small lipoma in the transverse colon.                           - Two 4 to 5 mm polyps in the descending colon,                            removed with a cold snare. Resected and retrieved.                           - One 7 mm polyp in the sigmoid colon, removed with                            a cold snare. Resected and retrieved.                           - A  single erosion in the rectum (from previous                            biopsy).                           - The distal rectum and anal verge are normal on                            retroflexion view. Recommendation:           - Patient has a contact number available for                            emergencies. The signs and symptoms of potential                            delayed complications were discussed with the                            patient. Return to normal activities tomorrow.                            Written discharge instructions were provided to the                            patient.                           - Resume previous diet.                           - Continue present medications.                           - Await pathology results.                           - Repeat colonoscopy in 1 year for surveillance                            with extended 2 day prep with Dr. San. Gordy CHRISTELLA Starch, MD 10/12/2024 11:19:47 AM This report has been signed electronically.

## 2024-10-12 NOTE — Progress Notes (Signed)
 GASTROENTEROLOGY PROCEDURE H&P NOTE   Primary Care Physician: Jefferey Fitch, MD    Reason for Procedure:  Poor prep yesterday, hx of multiple adenomas, family hx of colon cancer  Plan:    colonoscopy  Patient is appropriate for endoscopic procedure(s) in the ambulatory ([facility]) setting.  The nature of the procedure, as well as the risks, benefits, and alternatives were carefully and thoroughly reviewed with the patient. Ample time for discussion and questions allowed.  All questions were answered. The patient understood, was satisfied, and agreed with the plan to proceed.    HPI: Paige [name] is a 58 y.o. female who presents for colonoscopy.  Medical history as below.  Tolerated the prep.  No recent chest pain or shortness of breath.  No abdominal pain today.  Past Medical History:  Diagnosis Date   Allergic rhinitis    Allergy    Anemia    Anxiety disorder    Back pain    Bilateral swelling of feet    Bronchitis    Cancer (HCC)    skin   Chronic fatigue    Chronic fatigue    Chronic headaches    Chronic pain    Constipation    Depression    Endometriosis    Family history of colon cancer    mother,uncles,aunts   Fibromyalgia    Fibromyalgia    Gallbladder problem    GERD (gastroesophageal reflux disease)    Hyperlipidemia    Hypothyroidism    IBS (irritable bowel syndrome)    Insomnia    Iron  deficiency    Joint pain    Laryngopharyngeal reflux (LPR)    Obesity    Osteoarthritis    Personal history of colonic polyps 04/2006   polypoid   PONV (postoperative nausea and vomiting)    RLS (restless legs syndrome)    Sleep apnea    no cpap   SOB (shortness of breath)    Swelling    Vitamin B12 deficiency    Vitamin D  deficiency     Past Surgical History:  Procedure Laterality Date   ANTERIOR CRUCIATE LIGAMENT REPAIR Left 2003   APPENDECTOMY  1993   BACK SURGERY  03/2024   spinal cord stimulator placed   CARPAL TUNNEL RELEASE Left  09/25/2017   Procedure: LEFT CARPAL TUNNEL RELEASE;  Surgeon: Murrell Kuba, MD;  Location: [facility];  Service: Orthopedics;  Laterality: Left;   CARPAL TUNNEL RELEASE Right    CHOLECYSTECTOMY  1999   COLONOSCOPY  2009   [facility]   DE QUERVAIN'S RELEASE     LUMBAR LAMINECTOMY     L4- S1   PANNICULECTOMY     POLYPECTOMY     REPAIR ANKLE LIGAMENT Left 2001   ROUX-EN-Y GASTRIC BYPASS  04/2004   Dr Donnice Lunger   TONSILLECTOMY AND ADENOIDECTOMY  1976   TRIGGER FINGER RELEASE      Prior to Admission medications   Medication Sig Start Date End Date Taking? Authorizing Provider  buPROPion  (WELLBUTRIN  XL) 150 MG 24 hr tablet TAKE 3 TABLETS EVERY MORNING Patient taking differently: Take 150 mg by mouth daily. TAKE 1 TABLET EVERY MORNING 01/27/24  Yes Mozingo, Regina Nattalie, NP  cariprazine  (VRAYLAR ) 3 MG capsule Take 1 capsule (3 mg total) by mouth daily. 10/10/24  Yes Mozingo, Regina Nattalie, NP  clonazePAM  (KLONOPIN ) 0.5 MG tablet Take 1 tablet (0.5 mg total) by mouth 2 (two) times daily. 09/06/24  Yes Mozingo, Regina Nattalie, NP  DULoxetine  (CYMBALTA ) 60 MG  capsule Take 1 capsule (60 mg total) by mouth 2 (two) times daily. 12/16/23  Yes Mozingo, Regina Nattalie, NP  estradiol  (ESTRACE ) 0.5 MG tablet Take 0.5 mg by mouth daily. 01/23/24  Yes [provider]  fluticasone (FLONASE SENSIMIST) 27.5 MCG/SPRAY nasal spray 2 sprays (1 spray in each nostril) Nasally Once a day during seasons of difficulty for 30 days 02/17/24  Yes [provider]  ibuprofen (ADVIL) 800 MG tablet Take 800 mg by mouth 3 (three) times daily. Patient taking differently: Take 800 mg by mouth every 12 (twelve) hours. 12/29/23  Yes [provider]  ipratropium (ATROVENT) 0.06 % nasal spray Place 1 spray into both nostrils daily. 08/20/19  Yes [provider]  lactulose (CHRONULAC) 10 GM/15ML solution TAKE 15 TO 30 ML AS DIRECTED UP TO 2 TIMES DAILY FOR CONSTIPATION 09/02/24   Yes Collier, Amanda R, PA-C  levothyroxine  (SYNTHROID , LEVOTHROID) 88 MCG tablet Take 88 mcg by mouth daily before breakfast.   Yes [provider]  linaclotide LARUE) 290 MCG CAPS capsule Take 1 capsule (290 mcg total) by mouth daily before breakfast. 09/01/24 11/30/24 Yes Collier, Amanda R, PA-C  liothyronine (CYTOMEL) 5 MCG tablet Take 5 mcg by mouth daily. 07/27/21  Yes [provider]  metFORMIN  (GLUCOPHAGE ) 500 MG tablet 1 po TID with food 10/06/24  Yes Opalski, Deborah, DO  methylphenidate  (RITALIN ) 10 MG tablet Take 10 mg by mouth 2 (two) times daily. 08/19/24  Yes [provider]  Multiple Vitamins-Minerals (MULTIVITAMIN GUMMIES ADULT PO) Take 1 each by mouth daily.   Yes [provider]  omeprazole  (PRILOSEC) 20 MG capsule Take 20 mg by mouth 2 (two) times daily before a meal. 09/25/19  Yes [provider]  Oxycodone  HCl 10 MG TABS Take 10 mg by mouth every 6 (six) hours as needed. Patient taking differently: Take 10 mg by mouth every 4 (four) hours. 01/10/20  Yes [provider]  potassium chloride  (K-DUR,KLOR-CON ) 10 MEQ tablet Take 1 tablet by mouth daily. 03/04/18  Yes [provider]  progesterone  (PROMETRIUM ) 100 MG capsule Take 100 mg by mouth daily. 01/28/24  Yes [provider]  Propylene Glycol (SYSTANE COMPLETE) 0.6 % SOLN Apply 2 drops to eye.   Yes [provider]  rOPINIRole  (REQUIP ) 2 MG tablet Take 2 mg by mouth 2 (two) times daily.   Yes [provider]  torsemide (DEMADEX) 20 MG tablet Take 20 mg by mouth 2 (two) times daily. 12/22/19  Yes [provider]  traZODone  (DESYREL ) 100 MG tablet Take 2 tablets (200 mg total) by mouth at bedtime. 12/16/23  Yes Mozingo, Regina Nattalie, NP  Vitamin D -Vitamin K (VITAMIN K2-VITAMIN D3 PO) Take 1 each by mouth daily. Vitamin K2 100 mcg/Vitamin D  50 mcg per gummie   Yes [provider]  Albuterol-Budesonide (AIRSUPRA ) 90-80 MCG/ACT  AERO Inhale 1 each into the lungs 2 (two) times daily. Patient not taking: Reported on 10/12/2024    [provider]  diazepam  (VALIUM ) 5 MG tablet Place 1 tablet vaginally nightly as needed for muscle spasm/ pelvic pain. Patient not taking: Reported on 10/11/2024 05/18/24   Marilynne Rosaline SAILOR, MD  Erenumab -aooe (AIMOVIG ) 70 MG/ML SOAJ INJECT 70 MG INTO THE SKIN EVERY 28 DAYS. 09/10/23   Skeet Juliene SAUNDERS, DO  estradiol  (ESTRACE ) 0.1 MG/GM vaginal cream Place 0.5 g vaginally 2 (two) times a week. Place 0.5g nightly for two weeks then twice a week after 06/03/24   Marilynne Rosaline SAILOR, MD  Fe Fum-Fe  Poly-Vit C-Lactobac (FUSION) 65-65-25-30 MG CAPS Take by mouth.    [provider]  gabapentin  (NEURONTIN ) 300 MG capsule Take 300 mg by mouth 3 (three) times daily. Patient not taking: Reported on 10/12/2024 09/02/24   [provider]  Magnesium Citrate (MAGNESIUM GUMMIES PO) Take 125 mg by mouth 2 (two) times daily.    [provider]  nystatin cream (MYCOSTATIN) Apply 1 application  topically daily as needed for dry skin.    [provider]  Olopatadine HCl 0.2 % SOLN 1 drop into affected eye Ophthalmic Once a day or prn for 30 days 02/17/24   [provider]  ondansetron  (ZOFRAN -ODT) 4 MG disintegrating tablet Take 1 tablet (4 mg total) by mouth every 8 (eight) hours as needed for nausea or vomiting. 09/13/24   Skeet Juliene SAUNDERS, DO  promethazine  (PHENERGAN ) 12.5 MG tablet Take 1 tablet (12.5 mg total) by mouth every 8 (eight) hours as needed for nausea or vomiting. Patient not taking: Reported on 10/11/2024 05/26/19   Aneita Gwendlyn DASEN, MD  tiZANidine (ZANAFLEX) 4 MG tablet Take 4 mg by mouth 2 (two) times daily. 01/16/24   [provider]  Ubrogepant  (UBRELVY ) 100 MG TABS Take 1 tablet (100 mg total) by mouth daily as needed (at onset of headaches may repeat dose after 2 hours as needed max 200 mg in 24 hours). 09/10/23   Skeet Juliene SAUNDERS, DO     Current Outpatient Medications  Medication Sig Dispense Refill   buPROPion  (WELLBUTRIN  XL) 150 MG 24 hr tablet TAKE 3 TABLETS EVERY MORNING (Patient taking differently: Take 150 mg by mouth daily. TAKE 1 TABLET EVERY MORNING) 270 tablet 3   cariprazine  (VRAYLAR ) 3 MG capsule Take 1 capsule (3 mg total) by mouth daily. 90 capsule 0   clonazePAM  (KLONOPIN ) 0.5 MG tablet Take 1 tablet (0.5 mg total) by mouth 2 (two) times daily. 60 tablet 2   DULoxetine  (CYMBALTA ) 60 MG capsule Take 1 capsule (60 mg total) by mouth 2 (two) times daily. 180 capsule 3   estradiol  (ESTRACE ) 0.5 MG tablet Take 0.5 mg by mouth daily.     fluticasone (FLONASE SENSIMIST) 27.5 MCG/SPRAY nasal spray 2 sprays (1 spray in each nostril) Nasally Once a day during seasons of difficulty for 30 days     ibuprofen (ADVIL) 800 MG tablet Take 800 mg by mouth 3 (three) times daily. (Patient taking differently: Take 800 mg by mouth every 12 (twelve) hours.)     ipratropium (ATROVENT) 0.06 % nasal spray Place 1 spray into both nostrils daily.     lactulose (CHRONULAC) 10 GM/15ML solution TAKE 15 TO 30 ML AS DIRECTED UP TO 2 TIMES DAILY FOR CONSTIPATION 5400 mL 0   levothyroxine  (SYNTHROID , LEVOTHROID) 88 MCG tablet Take 88 mcg by mouth daily before breakfast.     linaclotide (LINZESS) 290 MCG CAPS capsule Take 1 capsule (290 mcg total) by mouth daily before breakfast. 90 capsule 0   liothyronine (CYTOMEL) 5 MCG tablet Take 5 mcg by mouth daily.     metFORMIN  (GLUCOPHAGE ) 500 MG tablet 1 po TID with food 90 tablet 0   methylphenidate  (RITALIN ) 10 MG tablet Take 10 mg by mouth 2 (two) times daily.     Multiple Vitamins-Minerals (MULTIVITAMIN GUMMIES ADULT PO) Take 1 each by mouth daily.     omeprazole  (PRILOSEC) 20 MG capsule Take 20 mg by mouth 2 (two) times daily before a meal.     Oxycodone  HCl 10 MG TABS Take 10 mg by  mouth every 6 (six) hours as needed. (Patient taking differently: Take 10 mg by mouth every 4 (four) hours.)      potassium chloride  (K-DUR,KLOR-CON ) 10 MEQ tablet Take 1 tablet by mouth daily.     progesterone  (PROMETRIUM ) 100 MG capsule Take 100 mg by mouth daily.     Propylene Glycol (SYSTANE COMPLETE) 0.6 % SOLN Apply 2 drops to eye.     rOPINIRole  (REQUIP ) 2 MG tablet Take 2 mg by mouth 2 (two) times daily.     torsemide (DEMADEX) 20 MG tablet Take 20 mg by mouth 2 (two) times daily.     traZODone  (DESYREL ) 100 MG tablet Take 2 tablets (200 mg total) by mouth at bedtime. 180 tablet 3   Vitamin D -Vitamin K (VITAMIN K2-VITAMIN D3 PO) Take 1 each by mouth daily. Vitamin K2 100 mcg/Vitamin D  50 mcg per gummie     Albuterol-Budesonide (AIRSUPRA ) 90-80 MCG/ACT AERO Inhale 1 each into the lungs 2 (two) times daily. (Patient not taking: Reported on 10/12/2024)     diazepam  (VALIUM ) 5 MG tablet Place 1 tablet vaginally nightly as needed for muscle spasm/ pelvic pain. (Patient not taking: Reported on 10/11/2024) 30 tablet 0   Erenumab -aooe (AIMOVIG ) 70 MG/ML SOAJ INJECT 70 MG INTO THE SKIN EVERY 28 DAYS. 1 mL 11   estradiol  (ESTRACE ) 0.1 MG/GM vaginal cream Place 0.5 g vaginally 2 (two) times a week. Place 0.5g nightly for two weeks then twice a week after 42.5 g 11   Fe Fum-Fe Poly-Vit C-Lactobac (FUSION) 65-65-25-30 MG CAPS Take by mouth.     gabapentin  (NEURONTIN ) 300 MG capsule Take 300 mg by mouth 3 (three) times daily. (Patient not taking: Reported on 10/12/2024)     Magnesium Citrate (MAGNESIUM GUMMIES PO) Take 125 mg by mouth 2 (two) times daily.     nystatin cream (MYCOSTATIN) Apply 1 application  topically daily as needed for dry skin.     Olopatadine HCl 0.2 % SOLN 1 drop into affected eye Ophthalmic Once a day or prn for 30 days     ondansetron  (ZOFRAN -ODT) 4 MG disintegrating tablet Take 1 tablet (4 mg total) by mouth every 8 (eight) hours as needed for nausea or vomiting. 20 tablet 11   promethazine  (PHENERGAN ) 12.5 MG tablet Take 1 tablet (12.5 mg total) by mouth every 8 (eight) hours as needed for  nausea or vomiting. (Patient not taking: Reported on 10/11/2024) 30 tablet 0   tiZANidine (ZANAFLEX) 4 MG tablet Take 4 mg by mouth 2 (two) times daily.     Ubrogepant  (UBRELVY ) 100 MG TABS Take 1 tablet (100 mg total) by mouth daily as needed (at onset of headaches may repeat dose after 2 hours as needed max 200 mg in 24 hours). 16 tablet 11   Current Facility-Administered Medications  Medication Dose Route Frequency Provider Last Rate Last Admin   0.9 %  sodium chloride  infusion  500 mL Intravenous Once Erryn Dickison, Gordy HERO, MD       dextrose  5 % solution   Intravenous Continuous Cirigliano, Vito V, DO        Allergies as of 10/12/2024 - Review Complete 10/12/2024  Allergen Reaction Noted   Tape Dermatitis and Itching 10/16/2018   Topiramate  Other (See Comments) and Shortness Of Breath 04/21/2017   Cyclobenzaprine Other (See Comments) 06/10/2011   Flexeril [cyclobenzaprine hcl] Other (See Comments) 06/10/2011   Meloxicam Other (See Comments) 09/22/2017   Naltrexone Other (See Comments) 05/20/2019   Porcine (pork) protein Other (See Comments) 05/22/2016  Sumatriptan Other (See Comments) 06/10/2011   Wound dressing adhesive Other (See Comments) 03/02/2024   Brexpiprazole Other (See Comments) 03/02/2024   Glycopyrrolate  Other (See Comments) 03/01/2024   Imitrex [sumatriptan base] Other (See Comments) 06/10/2011   Lamotrigine  Other (See Comments) 03/01/2024   Sulfa antibiotics Other (See Comments) 11/06/2020    Family History  Problem Relation Age of Onset   Obesity Mother    Depression Mother    Cancer Mother    Heart disease Mother    Hyperlipidemia Mother    Hypertension Mother    Colon cancer Mother        uncles,aunts   Sleep apnea Father    Cancer Father    Diabetes Father        paternal grandmother,sister   Leukemia Father    Lymphoma Father    Skin cancer Father    Heart disease Father    Colon polyps Sister    Diabetes Sister    Colon cancer Maternal Aunt    Colon  cancer Maternal Uncle    Colon cancer Cousin    Colon polyps Cousin    Stomach cancer Neg Hx    Esophageal cancer Neg Hx    Rectal cancer Neg Hx     Social History   Socioeconomic History   Marital status: Significant Other    Spouse name: Leonce Ee   Number of children: 0   Years of education: Not on file   Highest education level: Not on file  Occupational History   Occupation: RN/disabled  Tobacco Use   Smoking status: Never   Smokeless tobacco: Never  Vaping Use   Vaping status: Never Used  Substance and Sexual Activity   Alcohol  use: Not Currently    Alcohol /week: 0.0 standard drinks of alcohol     Comment: rarely   Drug use: No   Sexual activity: Yes    Birth control/protection: Other-see comments  Other Topics Concern   Not on file  Social History Narrative   Patient is left-handed. She lives alone in a one level home. She has 3 cats. She is unable to exercise. Caffeine one soda / day. On disability now. 2 bachelor degrees   Social Drivers of Health   Financial Resource Strain: Low Risk  (04/06/2024)   Received from [facility]   Overall Financial Resource Strain (CARDIA)    Difficulty of Paying Living Expenses: Not hard at all  Food Insecurity: Low Risk  (08/05/2024)   Received from [facility]   Hunger Vital Sign    Within the past 12 months, you worried that your food would run out before you got money to buy more: Never true    Within the past 12 months, the food you bought just didn't last and you didn't have money to get more. : Never true  Transportation Needs: No Transportation Needs (08/05/2024)   Received from [facility]    In the past 12 months, has lack of reliable transportation kept you from medical appointments, meetings, work or from getting things needed for daily living? : No  Physical Activity: Not on file  Stress: Not on file  Social Connections: Not on file  Intimate Partner Violence: Not on file    Physical  Exam: Vital signs in last 24 hours: @BP  125/76   Pulse 67   Temp 97.7 F (36.5 C) (Skin)   Ht 5' 7 (1.702 m)   Wt 230 lb (104.3 kg)   SpO2 98%   BMI 36.02  kg/m  GEN: NAD EYE: Sclerae anicteric ENT: MMM CV: Non-tachycardic Pulm: CTA b/l GI: Soft, NT/ND NEURO:  Alert & Oriented x 3   Gordy Starch, MD [facility]  10/12/2024 10:39 AM

## 2024-10-12 NOTE — Patient Instructions (Signed)
 Await pathology results.  YOU HAD AN ENDOSCOPIC PROCEDURE TODAY AT THE [facility]:   Refer to the procedure report that was given to you for any specific questions about what was found during the examination.  If the procedure report does not answer your questions, please call your gastroenterologist to clarify.  If you requested that your care partner not be given the details of your procedure findings, then the procedure report has been included in a sealed envelope for you to review at your convenience later.  YOU SHOULD EXPECT: Some feelings of bloating in the abdomen. Passage of more gas than usual.  Walking can help get rid of the air that was put into your GI tract during the procedure and reduce the bloating. If you had a lower endoscopy (such as a colonoscopy or flexible sigmoidoscopy) you may notice spotting of blood in your stool or on the toilet paper. If you underwent a bowel prep for your procedure, you may not have a normal bowel movement for a few days.  Please Note:  You might notice some irritation and congestion in your nose or some drainage.  This is from the oxygen used during your procedure.  There is no need for concern and it should clear up in a day or so.  SYMPTOMS TO REPORT IMMEDIATELY:  Following lower endoscopy (colonoscopy or flexible sigmoidoscopy):  Excessive amounts of blood in the stool  Significant tenderness or worsening of abdominal pains  Swelling of the abdomen that is new, acute  Fever of 100F or higher  For urgent or emergent issues, a gastroenterologist can be reached at any hour by calling [phone]. Do not use MyChart messaging for urgent concerns.    DIET:  We do recommend a small meal at first, but then you may proceed to your regular diet.  Drink plenty of fluids but you should avoid alcoholic beverages for 24 hours.  ACTIVITY:  You should plan to take it easy for the rest of today and you should NOT DRIVE or use heavy machinery  until tomorrow (because of the sedation medicines used during the test).    FOLLOW UP: Our staff will call the number listed on your records the next business day following your procedure.  We will call around 7:15- 8:00 am to check on you and address any questions or concerns that you may have regarding the information given to you following your procedure. If we do not reach you, we will leave a message.     If any biopsies were taken you will be contacted by phone or by letter within the next 1-3 weeks.  Please call us at [phone] if you have not heard about the biopsies in 3 weeks.    SIGNATURES/CONFIDENTIALITY: You and/or your care partner have signed paperwork which will be entered into your electronic medical record.  These signatures attest to the fact that that the information above on your After Visit Summary has been reviewed and is understood.  Full responsibility of the confidentiality of this discharge information lies with you and/or your care-partner.  

## 2024-10-13 ENCOUNTER — Telehealth: Payer: Self-pay | Admitting: *Deleted

## 2024-10-13 ENCOUNTER — Ambulatory Visit (INDEPENDENT_AMBULATORY_CARE_PROVIDER_SITE_OTHER): Admitting: Psychology

## 2024-10-13 DIAGNOSIS — F4310 Post-traumatic stress disorder, unspecified: Secondary | ICD-10-CM | POA: Diagnosis not present

## 2024-10-13 DIAGNOSIS — F902 Attention-deficit hyperactivity disorder, combined type: Secondary | ICD-10-CM | POA: Diagnosis not present

## 2024-10-13 DIAGNOSIS — M479 Spondylosis, unspecified: Secondary | ICD-10-CM | POA: Diagnosis not present

## 2024-10-13 DIAGNOSIS — F39 Unspecified mood [affective] disorder: Secondary | ICD-10-CM

## 2024-10-13 NOTE — Telephone Encounter (Signed)
 Left message on f/u call

## 2024-10-13 NOTE — Progress Notes (Signed)
 [facility] Counselor/Therapist Progress Note  Patient ID: SUN KIHN, MRN: [id number],    Date: 10/13/2024  Time Spent: 57 minutes  Time in:2:05 Time out:3:02  Treatment Type: Individual Therapy  Reported Symptoms: depression and worry  Mental Status Exam: Appearance:  Casual     Behavior: Appropriate  Motor: Normal  Speech/Language:  Normal Rate  Affect: blunted  Mood: pleasant  Thought process: normal  Thought content:   WNL  Sensory/Perceptual disturbances:   WNL  Orientation: oriented to person, place, time/date, and situation  Attention: Good  Concentration: Good  Memory: WNL  Fund of knowledge:  Good  Insight:   Good  Judgment:  Fair  Impulse Control: Fair   Risk Assessment: Danger to Self:  No Self-injurious Behavior: No Danger to Others: No Duty to Warn:no Physical Aggression / Violence:No  Access to Firearms a concern: No  Gang Involvement:No   Subjective: The patient attended a face-to-face individual therapy session via video visit.  The patient gave verbal consent for this session to be on video on caregility and patient is aware of the limitations of telehealth..  The patient was in her home alone and therapist was in the office alone.  The patient presents as pleasant and cooperative.  The patient reports that she is doing well.  She states that she has lost more weight and is happy about that.  She also states that things are going well with Leonce and she is pleased about that.  We had to do a little reframing when she first got on the session because she started off by being negative then I redirected her to try to be more positive than what she was saying.  She was able to make the change relatively quickly.  We talked about what she wants to do after I retire and I think she is a little anxious about not having a therapist but I do believe that she would be okay to graduate and only come back if necessary.  We talked about therapy  being goal directed and she has certainly made a great deal of progress since we have been working together.  At first she was very dissociative and having difficulty even attending to conversations.  Time she has much better with managing her mood and being able to do things that I do not think she would have been able to do happy not that we did.  I do think that I will continue to see her until I retire in March and give her resources that if she would like to continue someone she could contact to get scheduled if necessary.   Interventions: Cognitive Behavioral Therapy  Diagnosis:Episodic mood disorder  PTSD (post-traumatic stress disorder)  Attention deficit hyperactivity disorder (ADHD), combined type  Plan: Treatment Plan  Strengths/Abilities:  Intelligent, kind, motivated  Treatment Preferences:  Outpatient Individual therapy  Statement of Needs:  I need some help with getting back out into the world and learn how to interact and be social   Symptoms:  Describes a reliving of the event, particularly through dissociative flashbacks.:  (Status: improved). Displays a significant decline in interest and engagement in activities.: (Status: improved). Displays significant psychological and/or physiological  distress resulting from internal and external clues that are reminiscent of the traumatic event.:  (Status: improved). Experiences disturbances in sleep.:   (Status: improved). Experiences disturbing and persistent thoughts, images, and/or perceptions of the  traumatic event.: (Status: improved). Experiences frequent nightmares.:  (Status: improved). Has been exposed to  a traumatic event involving actual or  perceived threat of death or serious injury.: (Status: maintained).  Hypervigilance (e.g., feeling constantly on edge, experiencing concentration difficulties, having trouble falling or staying asleep, exhibiting a general state of irritability).: (Status:  improved). Impairment  in social, occupational, or other areas of functioning.:  (Status: improved). Intentionally avoids activities, places, people, or objects (e.g., up-armored vehicles) that evoke memories of the event.:  (Status: improved). Intentionally avoids  thoughts, feelings, or discussions related to the traumatic event.: (Status:  improved). Reports difficulty concentrating as well as feelings of guilt.:   (Status: improved). Reports response of intense fear, helplessness, or horror to the traumatic event.: (Status: improved). Symptoms present more than one month.:  (Status: maintained).    Problems Addressed:  Anxiety, Posttraumatic Stress Disorder (PTSD),  Goals:  LTG:  1. Enhance ability to effectively cope with the full variety of life's worries  and anxieties.  90% 2. No longer avoids persons, places, activities, and objects that are  reminiscent of the traumatic event.  90% 3. No longer experiences intrusive event recollections, avoidance of event  reminders, intense arousal, or disinterest in activities or  relationships.  100% 4. Stabilize anxiety level while increasing ability to function on a daily  basis.  90% 5. Thinks about or openly discusses the traumatic event with others  without experiencing psychological or physiological distress.  80%  STG: 1.Identify and engage in pleasant activities on a daily basis..  90% 2.Identify, challenge, and replace biased, fearful self-talk with positive, realistic, and empowering self talk 3.  Participate in Cognitive Therapy to help identify, challenge, and replace biased, negative, and selfdefeating thoughts resulting from the trauma.  80 % 4.  Participate in Eye Movement Desensitization and Reprocessing (EMDR) to reduce emotional distress  related to traumatic thoughts, feelings, and images.  90%  Target Date:  02/16/2025 Frequency: Every three weeks Modality:Individual Therapy Interventions by Therapist:  CBT, EMDR, insight oriented therapy,  problem solving  Patient approved treatment plan and is progressing nicely.  Homer Pfeifer G Fredick Schlosser, LCSW

## 2024-10-14 ENCOUNTER — Ambulatory Visit: Payer: Self-pay | Admitting: Internal Medicine

## 2024-10-14 ENCOUNTER — Encounter: Payer: Self-pay | Admitting: Physical Therapy

## 2024-10-14 LAB — SURGICAL PATHOLOGY

## 2024-10-15 ENCOUNTER — Other Ambulatory Visit (HOSPITAL_COMMUNITY): Payer: Self-pay

## 2024-10-15 LAB — SURGICAL PATHOLOGY

## 2024-10-19 ENCOUNTER — Encounter: Payer: Self-pay | Admitting: Physical Therapy

## 2024-10-19 ENCOUNTER — Ambulatory Visit: Payer: Self-pay | Admitting: Physical Therapy

## 2024-10-19 DIAGNOSIS — M6281 Muscle weakness (generalized): Secondary | ICD-10-CM

## 2024-10-19 DIAGNOSIS — L905 Scar conditions and fibrosis of skin: Secondary | ICD-10-CM | POA: Diagnosis not present

## 2024-10-19 DIAGNOSIS — R278 Other lack of coordination: Secondary | ICD-10-CM

## 2024-10-19 DIAGNOSIS — R102 Pelvic and perineal pain unspecified side: Secondary | ICD-10-CM

## 2024-10-19 NOTE — Therapy (Signed)
 OUTPATIENT PHYSICAL THERAPY FEMALE PELVIC TREATMENT   Patient Name: Paige [name] MRN: [id number] DOB:04/21/1966, 58 y.o., female Today's Date: 10/19/2024  END OF SESSION:  PT End of Session - 10/19/24 1506     Visit Number 7    Date for Recertification  11/18/24    Authorization Type [employer] medicare    Authorization - Visit Number 7    Authorization - Number of Visits 10    PT Start Time 1500    PT Stop Time 1545    PT Time Calculation (min) 45 min    Activity Tolerance Patient tolerated treatment well    Behavior During Therapy WFL for tasks assessed/performed          Past Medical History:  Diagnosis Date   Allergic rhinitis    Allergy    Anemia    Anxiety disorder    Back pain    Bilateral swelling of feet    Bronchitis    Cancer (HCC)    skin   Chronic fatigue    Chronic fatigue    Chronic headaches    Chronic pain    Constipation    Depression    Endometriosis    Family history of colon cancer    mother,uncles,aunts   Fibromyalgia    Fibromyalgia    Gallbladder problem    GERD (gastroesophageal reflux disease)    Hyperlipidemia    Hypothyroidism    IBS (irritable bowel syndrome)    Insomnia    Iron  deficiency    Joint pain    Laryngopharyngeal reflux (LPR)    Obesity    Osteoarthritis    Personal history of colonic polyps 04/2006   polypoid   PONV (postoperative nausea and vomiting)    RLS (restless legs syndrome)    Sleep apnea    no cpap   SOB (shortness of breath)    Swelling    Vitamin B12 deficiency    Vitamin D  deficiency    Past Surgical History:  Procedure Laterality Date   ANTERIOR CRUCIATE LIGAMENT REPAIR Left 2003   APPENDECTOMY  1993   BACK SURGERY  03/2024   spinal cord stimulator placed   CARPAL TUNNEL RELEASE Left 09/25/2017   Procedure: LEFT CARPAL TUNNEL RELEASE;  Surgeon: Murrell Kuba, MD;  Location: [facility];  Service: Orthopedics;  Laterality: Left;   CARPAL TUNNEL RELEASE Right     CHOLECYSTECTOMY  1999   COLONOSCOPY  2009   [facility]   DE QUERVAIN'S RELEASE     LUMBAR LAMINECTOMY     L4- S1   PANNICULECTOMY     POLYPECTOMY     REPAIR ANKLE LIGAMENT Left 2001   ROUX-EN-Y GASTRIC BYPASS  04/2004   Dr Donnice Lunger   TONSILLECTOMY AND ADENOIDECTOMY  1976   TRIGGER FINGER RELEASE     Patient Active Problem List   Diagnosis Date Noted   High-tone pelvic floor dysfunction 05/18/2024   SUI (stress urinary incontinence, female) 05/18/2024   Overactive bladder 05/18/2024   Incontinence of feces 05/18/2024   Vaginal atrophy 05/18/2024   Chronic idiopathic constipation 05/18/2024   Nutritional deficiency 03/17/2024   OSA on CPAP 03/17/2024   Encounter for orthopedic follow-up care 01/31/2020   Pain of left hand 12/19/2018   Radial styloid tenosynovitis 12/19/2018   Trigger finger 12/19/2018   Abscess of postoperative wound of abdominal wall 10/05/2018   Pannus, abdominal 09/21/2018   Pigmented skin lesion 06/23/2018   Cervicalgia 02/12/2018   Bilateral carpal tunnel syndrome 08/13/2017  Primary osteoarthritis of both first carpometacarpal joints 08/13/2017   Muscle tension dysphonia 08/12/2016   Vocal cord atrophy 08/12/2016   Hoarseness 07/02/2016   Laryngopharyngeal reflux (LPR) 07/02/2016   Cough 05/23/2016   Dyspnea 05/23/2016   Chronic pain 05/22/2016   Sacrococcygeal disorders, not elsewhere classified 11/24/2015   Anemia, iron  deficiency 08/24/2015   Fibrositis 08/17/2015   Abnormal weight gain 08/17/2015   Anxiety 08/17/2015   Recurrent major depressive episodes 08/17/2015   History of migraine headaches 08/17/2015   Neurosis, posttraumatic 08/17/2015   Bariatric surgery status 08/17/2015   DDD (degenerative disc disease), lumbar 01/18/2015   Facet joint syndrome 01/18/2015   Thoracic spondylosis 01/18/2015   Myofascial pain 02/22/2013   Back ache 07/09/2012   Family history of malignant neoplasm of gastrointestinal tract 06/10/2011    History of colonic polyps 06/10/2011   Esophageal reflux 06/10/2011   Cellulitis 06/10/2011   History of Roux-en-Y gastric bypass 06/10/2011   B12 nutritional deficiency 06/10/2011   Morbid obesity due to excess calories (HCC) 06/10/2011   Personal history of fibromyalgia 06/10/2011   Recurrent major depressive disorder, in partial remission 06/10/2011   Acid reflux 06/10/2011    PCP: Viona Fitch, MD  REFERRING PROVIDER: Marilynne Rosaline SAILOR, MD   REFERRING DIAG:  M62.89 (ICD-10-CM) - High-tone pelvic floor dysfunction  N39.3 (ICD-10-CM) - SUI (stress urinary incontinence, female)  N32.81 (ICD-10-CM) - Overactive bladder  R15.9 (ICD-10-CM) - Incontinence of feces, unspecified fecal incontinence type    THERAPY DIAG:  Muscle weakness (generalized)  Other lack of coordination  Scar  Pelvic pain  Rationale for Evaluation and Treatment: Rehabilitation  ONSET DATE: 12/2023  SUBJECTIVE:                                                                                                                                                                                           SUBJECTIVE STATEMENT: Most of the time the lower abdominal pain is managed. I had 6 polyps that were negative. I had a nodule at the end of the rectum. I have not had a bowel movement since Tuesday of last week. I have slow motility of the intestines.  Fluid intake:  100oz water, 1 small bottle diet dr pepper per day. Has an occasional coffee. Usually only has sips of water before bed.   FUNCTIONAL LIMITATIONS: Patient uses a stick for walking  PERTINENT HISTORY:  Surgeries: Cholecystectomy; appendectomy; back surgery with spinal cored stimulator; Roux-EN-Y gastric bypass Other: Skin Caner; Fibromyalgia; IBS; Hypothyroidism;    PAIN:  Are you having pain? Yes NPRS scale: 5/10 10/19/24: pain level is 6/10 Pain location: lower abdominal area  Pain  type: aching Pain description: constant    Aggravating factors: needing to have a bowel movement and not able to Relieving factors: heat at one tim  PRECAUTIONS: Other: skin cancer; spinal cord stimulator  RED FLAGS: None   WEIGHT BEARING RESTRICTIONS: No  FALLS:  Has patient fallen in last 6 months? No  OCCUPATION: disability  ACTIVITY LEVEL : Patient has stopped exercising since 9/1 due to back pain  PLOF: Independent  PATIENT GOALS: be able to urinate better, not have to use an enema to have a bowel movement, reduce pelvic pain   BOWEL MOVEMENT: Pain with bowel movement: Yes, 8-9/10 Type of bowel movement:0-4 time(s) per week ; strains; Type 1, hard rocks Fully empty rectum: No Leakage: Consistency with leakage: soft                                                      Caused by: small amounts weekly, usually after an incomplete BM  Pads: Yes: depends at night Fiber supplement/laxative stool softener (dulcolax 5mg  BID), Ibrelsa BID   URINATION: Pain with urination: Yes, burning of retention Fully empty bladder: Nonot able to urinate right away, sometimes needs to run water; she will wipe then urinate again                          Post-void dribble: No Stream: Weak Urgency: No Frequency:during the day  8+                                                           Nocturia: Yes:  2-6 times per night to void    10/05/24: wakes up 2 times Leakage: Coughing, Sneezing, Laughing, and without sensation Pads/briefs:  0-2 pads per day ; depends  at night  INTERCOURSE:  Ability to have vaginal penetration Yes  Pain with intercourse: Initial Penetration and Deep Penetration Dryness: Yes    PROLAPSE: None   OBJECTIVE:  Note: Objective measures were completed at Evaluation unless otherwise noted.  DIAGNOSTIC FINDINGS:  Pelvic floor strength I/V, puborectalis I/V external anal sphincter I/V   Pelvic floor musculature: Right levator tender, Right obturator tender, Left levator tender, Left obturator tender. High  tone throughout Post Void Residual 10 mL      COGNITION: Overall cognitive status: Within functional limits for tasks assessed     SENSATION: Light touch: Appears intact   GAIT: Assistive device utilized: walking stick  POSTURE: rounded shoulders, forward head, and decreased lumbar lordosis; Right shoulder higher; patient had scoliosos   LUMBARAROM/PROM:not tested due to fusion    LOWER EXTREMITY MNF:qloo bilateral hip ROM   LOWER EXTREMITY MMT:  MMT Right eval Left eval  Hip flexion 4/5 4/5  Hip extension 4/5 4/5  Hip abduction 3/5 3/5   (Blank rows = not tested) PALPATION:  Abdominal: tenderness of the lower abdomen; increased lower rib cage angle  Diastasis: No Distortion: No  Breathing: contract the abdominals and will raise the lower rib cage Scar tissue: Yes: along the lower abdomen, lower umbilicus,                 External Perineal  Exam: redness along the posterior introitus, tenderness located on the perineal body and around the rectum                             Internal Pelvic Floor: therapist was not able to get the index finger past the DIP joint into the vaginal canal due to pain; going through the rectum the puborectalis did not move forward well, tenderness throughout the levator ani and obturator internist and in the rectum.   Patient confirms identification and approves PT to assess internal pelvic floor and treatment Yes No emotional/communication barriers or cognitive limitation. Patient is motivated to learn. Patient understands and agrees with treatment goals and plan. PT explains patient will be examined in standing, sitting, and lying down to see how their muscles and joints work. When they are ready, they will be asked to remove their underwear so PT can examine their perineum. The patient is also given the option of providing their own chaperone as one is not provided in our facility. The patient also has the right and is explained the right to  defer or refuse any part of the evaluation or treatment including the internal exam. With the patient's consent, PT will use one gloved finger to gently assess the muscles of the pelvic floor, seeing how well it contracts and relaxes and if there is muscle symmetry. After, the patient will get dressed and PT and patient will discuss exam findings and plan of care. PT and patient discuss plan of care, schedule, attendance policy and HEP activities.   PELVIC MMT:   MMT eval 09/16/24 10/05/24  Vaginal 2/5 4/5   Internal Anal Sphincter 2/5  2/5  External Anal Sphincter 2/5  2/5  Puborectalis 2/5  2/5  (Blank rows = not tested)        TONE: Increased tone of the pelvic floor  PROLAPSE: none  TODAY'S TREATMENT:  10/19/24 Manual: Soft tissue mobilization:  Manual work to the diaphragm [employer] abdominal massage to promote peristalic motion of the intestines to assist with movement of stool Scar tissue mobilization: Manual work to the lower abdominal scar going through the different layers of restrictions Tissue rolling along the scar Using a suction cup to pull up on the tissue to release the restrictions Myofascial release: Fascial release along the mons pubs and lower abdomen to reduce the restrictions    10/05/24 Manual: Internal pelvic floor techniques: No emotional/communication barriers or cognitive limitation. Patient is motivated to learn. Patient understands and agrees with treatment goals and plan. PT explains patient will be examined in standing, sitting, and lying down to see how their muscles and joints work. When they are ready, they will be asked to remove their underwear so PT can examine their perineum. The patient is also given the option of providing their own chaperone as one is not provided in our facility. The patient also has the right and is explained the right to defer or refuse any part of the evaluation or treatment including the internal exam. With the patient's  consent, PT will use one gloved finger to gently assess the muscles of the pelvic floor, seeing how well it contracts and relaxes and if there is muscle symmetry. After, the patient will get dressed and PT and patient will discuss exam findings and plan of care. PT and patient discuss plan of care, schedule, attendance policy and HEP activities.  Therapist gloved finger in the rectum and  vaginal canal working on the perineal body, along the levator ani, obturator internist, one finger on the side of the bladder and other finger on the anococcygeal ligament, release from origin to insertion of the muscles Externally working around the perineal body, along the external anal sphincter, levator ani, and obturator internist Neuromuscular re-education: Pelvic floor contraction training: Therapist gloved finger in the rectum with patient working on pushing the finger out of the rectum and not tightening instead. Patient was lying on her side. She needed cues to fill her abdomen up and use different breathing out techniques.   09/23/24 Manual: Soft tissue mobilization: Manual work to the right hip adductors, levator ani, along the urogenital diaphragm, perineal body and superior transverse perineum Internal pelvic floor techniques: No emotional/communication barriers or cognitive limitation. Patient is motivated to learn. Patient understands and agrees with treatment goals and plan. PT explains patient will be examined in standing, sitting, and lying down to see how their muscles and joints work. When they are ready, they will be asked to remove their underwear so PT can examine their perineum. The patient is also given the option of providing their own chaperone as one is not provided in our facility. The patient also has the right and is explained the right to defer or refuse any part of the evaluation or treatment including the internal exam. With the patient's consent, PT will use one gloved finger to gently  assess the muscles of the pelvic floor, seeing how well it contracts and relaxes and if there is muscle symmetry. After, the patient will get dressed and PT and patient will discuss exam findings and plan of care. PT and patient discuss plan of care, schedule, attendance policy and HEP activities.  Therapist gloved finger in the vaginal canal work on the right pelvic floor muscles going very slowly due to the tissue is thin and umcomfortable. There fascial release along the right side of the bladder, along the urethra, and right anterior vaginal wall to release the tissue and restrictions.  Exercises: Strengthening: Standing bilateral shoulder extension with red band 15 x 2 Standing diagonal pulling down to opposite hip 15 x 2 Standing pushing red band forward from behind 15 x 2 Self-care: Discussed with patient on using her vaginal moisturizers to help with the discomfort with internal work       PATIENT EDUCATION:  09/23/24 Education details: Access Code: [email or web address], scar massage Person educated: Patient Education method: Explanation, Demonstration, Tactile cues, Verbal cues, and Handouts Education comprehension: verbalized understanding, returned demonstration, verbal cues required, tactile cues required, and needs further education  HOME EXERCISE PROGRAM: 09/23/24 Access Code: [email or web address] URL: [email or web address] Date: 09/23/2024 Prepared by: Channing Pereyra  Exercises - Supine Diaphragmatic Breathing  - 2 x daily - 7 x weekly - 1 sets - 10 reps - Seated Diaphragmatic Breathing  - 2 x daily - 7 x weekly - 1 sets - 10 reps - Shoulder Extension with Resistance - 90/90 on Swiss Ball  - 1 x daily - 7 x weekly - 1 sets - 10 reps - Hooklying Anti-Rotation Press With Anchored Resistance  - 1 x daily - 7 x weekly - 1 sets - 10 reps - Hooklying Transversus Abdominis Palpation  - 1 x daily - 7 x weekly - 1 sets - 10 reps - Standing Shoulder Extension with Resistance  - 1 x daily - 3 x  weekly - 1 sets - 10 reps - Standing Diagonal Shoulder Extension with Anchored Resistance  - 1 x  daily - 3 x weekly - 1 sets - 10 reps - Standing Shoulder Flexion with Posterior Anchored Resistance  - 1 x daily - 3 x weekly - 1 sets - 10 reps  Patient Education - Abdominal Massage for Constipation - Abdominal Massage for Constipation   ASSESSMENT:  CLINICAL IMPRESSION: Patient is a 58 y.o. female who was seen today for physical therapy  treatment for high toned pelvic floor, urinary and fecal incontinence. Patient has not had a bowel movement in 1 week. Therapy focused on improving the peristalic motion of the intestines to assist with a bowel movement. She had dome gurgling noise with the manual work. She had less tightness along the lower abdominal scar.  She was abel to urinate with greater ease after the manual work. Patient will benefit from skilled therapy to improve pelvic floor muscle excursion, reduce pain and coordination to help with continence and toileting.   OBJECTIVE IMPAIRMENTS: decreased activity tolerance, decreased coordination, decreased strength, increased fascial restrictions, increased muscle spasms, and pain.   ACTIVITY LIMITATIONS: sleeping, continence, and toileting  PARTICIPATION LIMITATIONS: meal prep, cleaning, laundry, interpersonal relationship, shopping, and community activity  PERSONAL FACTORS: Cholecystectomy; appendectomy; back surgery with spinal cored stimulator; Roux-EN-Y gastric bypass are also affecting patient's functional outcome.   REHAB POTENTIAL: Excellent  CLINICAL DECISION MAKING: Unstable/unpredictable  EVALUATION COMPLEXITY: High   GOALS: Goals reviewed with patient? Yes  SHORT TERM GOALS: Target date: 09/23/24  Patient educated on abdominal massage to assist with peristalic motion of the intestines.  Baseline: Goal status: Met 09/02/24  2.  Patient educated on scar mobilization to improve mobility and reduce pain.  Baseline:   Goal status: Met 09/02/24  3.  Patient is able to perform diaphragmatic breathing to lengthen the pelvic floor to urinate and have a bowel movement.  Baseline:  Goal status: Met 09/09/24  4.  Patient educated on correct ways to have a bowel movement to reduce straining.  Baseline:  Goal status: Met 09/16/24  LONG TERM GOALS: Target date: 11/18/24  Patient independent with advanced HEP for core and pelvic floor strength to improve continence.  Baseline:  Goal status: INITIAL  2.  Patient is able to sit on commode and urinate within 15 seconds due to the ability to relax her pelvic floor.  Baseline:  Goal status: INITIAL  3.  Patient is able to sit on the commode to have a bowel movement and not strain 50% of the time due to the ability to relax the pelvic floor.  Baseline:  Goal status: INITIAL  4.  Patient has increased in lower abdominal scar to reduce her lower abdominal pain </= 2/10.  Baseline:  Goal status: INITIAL  5.  Patient reports her urinary leakage decreased </= 70% due to the ability to fully relax the pelvic floor and contract and reduction of trigger points.  Baseline: urinary leakage decreased by 60% Goal status: INITIAL   PLAN:  PT FREQUENCY: 1-2x/week  PT DURATION: 12 weeks  PLANNED INTERVENTIONS: 97110-Therapeutic exercises, 97530- Therapeutic activity, 97112- Neuromuscular re-education, 97535- Self Care, 02859- Manual therapy, 20560 (1-2 muscles), 20561 (3+ muscles)- Dry Needling, Patient/Family education, Cryotherapy, Moist heat, and Biofeedback  PLAN FOR NEXT SESSION:  manual work on the outside of the vagina on the right, core exercises; ask if she has had a bowel movement  Channing Pereyra, PT 10/19/24 4:22 PM

## 2024-10-20 ENCOUNTER — Ambulatory Visit: Payer: Self-pay | Admitting: Gastroenterology

## 2024-10-20 ENCOUNTER — Other Ambulatory Visit: Payer: Self-pay

## 2024-10-20 ENCOUNTER — Telehealth: Payer: Self-pay | Admitting: Adult Health

## 2024-10-20 DIAGNOSIS — F39 Unspecified mood [affective] disorder: Secondary | ICD-10-CM

## 2024-10-20 DIAGNOSIS — F331 Major depressive disorder, recurrent, moderate: Secondary | ICD-10-CM

## 2024-10-20 MED ORDER — CARIPRAZINE HCL 3 MG PO CAPS: 3.0000 mg | ORAL_CAPSULE | Freq: Every day | ORAL | 3 refills | Status: AC

## 2024-10-20 NOTE — Telephone Encounter (Signed)
 Printed Rx for Vraylar  3 mg and gave to [facility] per request.

## 2024-10-20 NOTE — Telephone Encounter (Signed)
 Pt is sending in renewal application for Vraylar  pt assistance. Can you verify her dose and print a written RX for a 90 day supply and 3 rf for a year. Give to [facility]. For her application. Thanks.

## 2024-10-20 NOTE — Telephone Encounter (Signed)
 Received

## 2024-10-25 ENCOUNTER — Encounter: Payer: Self-pay | Admitting: Adult Health

## 2024-10-25 ENCOUNTER — Telehealth: Admitting: Adult Health

## 2024-10-25 DIAGNOSIS — G4700 Insomnia, unspecified: Secondary | ICD-10-CM

## 2024-10-25 DIAGNOSIS — F4310 Post-traumatic stress disorder, unspecified: Secondary | ICD-10-CM | POA: Diagnosis not present

## 2024-10-25 DIAGNOSIS — F331 Major depressive disorder, recurrent, moderate: Secondary | ICD-10-CM | POA: Diagnosis not present

## 2024-10-25 DIAGNOSIS — F411 Generalized anxiety disorder: Secondary | ICD-10-CM

## 2024-10-25 MED ORDER — CLONAZEPAM 0.5 MG PO TABS: 0.5000 mg | ORAL_TABLET | Freq: Two times a day (BID) | ORAL | 2 refills | Status: DC

## 2024-10-25 MED ORDER — BUPROPION HCL ER (XL) 150 MG PO TB24: 150.0000 mg | ORAL_TABLET | Freq: Every morning | ORAL | 3 refills | Status: AC

## 2024-10-25 MED ORDER — TRAZODONE HCL 100 MG PO TABS: 200.0000 mg | ORAL_TABLET | Freq: Every day | ORAL | 3 refills | Status: AC

## 2024-10-25 MED ORDER — DULOXETINE HCL 60 MG PO CPEP: 60.0000 mg | ORAL_CAPSULE | Freq: Two times a day (BID) | ORAL | 3 refills | Status: AC

## 2024-10-25 NOTE — Progress Notes (Signed)
 Paige [name] [id number] [birth year] 58 y.o.  Virtual Visit via Video Note  I connected with pt @ on 10/25/24 at  2:00 PM EST by a video enabled telemedicine application and verified that I am speaking with the correct person using two identifiers.   I discussed the limitations of evaluation and management by telemedicine and the availability of in person appointments. The patient expressed understanding and agreed to proceed.  I discussed the assessment and treatment plan with the patient. The patient was provided an opportunity to ask questions and all were answered. The patient agreed with the plan and demonstrated an understanding of the instructions.   The patient was advised to call back or seek an in-person evaluation if the symptoms worsen or if the condition fails to improve as anticipated.  I provided 25 minutes of non-face-to-face time during this encounter.  The patient was located at home.  The provider was located at [facility].   Angeline LOISE Sayers, NP   Subjective:   Patient ID:  Paige [name] is a 58 y.o. (DOB 03-22-1966) female.  Chief Complaint: No chief complaint on file.   HPI [name] presents for follow-up of GAD, MDD, PTSD and insomnia.   Describes mood today as ok. Pleasant. Denies tearfulness. Mood symptoms - reports some depression, irritability and anxiety. Reports some interest and lacks motivation. Reports chronic pain issues - pinched nerve - seeing provider December 1st. Denies panic attacks. Reports some worry, rumination and over thinking a little bit. Reports mood as variable. Stating I feel like I'm doing ok. Reports taking medications as prescribed. Continues to see therapist every 4 weeks - Bambi Cottle.  Energy levels. Active, does not have a regular exercise routine - stretching - working with P/T.  Enjoys some usual interests and activities. Lives with partner and 4 cats inside and 6 outside. Mother local and  supportive. Attends church. Appetite adequate. Weight loss 38 pounds - 220 pounds- 68.5. Working with [employer] [facility]. Reports sleeping better some nights than others. Reports an average of 5 to 6 hours. Diagnosed with sleep apnea - using BiPAP machine. Denies daytime napping. Reports difficulties with focus and concentration. Completing tasks around the house. Out of work currently - typically working part time - 6 to 12 hours a week. Disabled since 2009. Denies SI or HI.  Denies AH or VH. Denies self harm. Denies substance use.  Previous medication trials: Lexapro, Paxil, Trazadone, Wellbutrin  SR, Geodon, Seroquel, Risperdal, Depakote, Topamax , Lithium, Provigil, Ambien, Cytomel, Clonazepam  and other benzodiazapines, Elavil, Effexor, Remeron, Prozac, Anafranil, Tofranil, Zoloft, Geodon, Buspar, Adderall, Ritalin , Abilify, Rexulti, Lamictal , Latuda  Review of Systems:  Review of Systems  Musculoskeletal:  Negative for gait problem.  Neurological:  Negative for tremors.  Psychiatric/Behavioral:         Please refer to HPI    Medications: I have reviewed the patient's current medications.  Current Outpatient Medications  Medication Sig Dispense Refill   Albuterol-Budesonide (AIRSUPRA ) 90-80 MCG/ACT AERO Inhale 1 each into the lungs 2 (two) times daily. (Patient not taking: Reported on 10/12/2024)     buPROPion  (WELLBUTRIN  XL) 150 MG 24 hr tablet Take 1 tablet (150 mg total) by mouth every morning. 90 tablet 3   cariprazine  (VRAYLAR ) 3 MG capsule Take 1 capsule (3 mg total) by mouth daily. 90 capsule 3   clonazePAM  (KLONOPIN ) 0.5 MG tablet Take 1 tablet (0.5 mg total) by mouth 2 (two) times daily. 60 tablet 2   diazepam  (  VALIUM ) 5 MG tablet Place 1 tablet vaginally nightly as needed for muscle spasm/ pelvic pain. (Patient not taking: Reported on 10/11/2024) 30 tablet 0   DULoxetine  (CYMBALTA ) 60 MG capsule Take 1 capsule (60 mg total) by mouth 2 (two) times daily.  180 capsule 3   Erenumab -aooe (AIMOVIG ) 70 MG/ML SOAJ INJECT 70 MG INTO THE SKIN EVERY 28 DAYS. 1 mL 11   estradiol  (ESTRACE ) 0.1 MG/GM vaginal cream Place 0.5 g vaginally 2 (two) times a week. Place 0.5g nightly for two weeks then twice a week after 42.5 g 11   estradiol  (ESTRACE ) 0.5 MG tablet Take 0.5 mg by mouth daily.     Fe Fum-Fe Poly-Vit C-Lactobac (FUSION) 65-65-25-30 MG CAPS Take by mouth.     fluticasone (FLONASE SENSIMIST) 27.5 MCG/SPRAY nasal spray 2 sprays (1 spray in each nostril) Nasally Once a day during seasons of difficulty for 30 days     gabapentin  (NEURONTIN ) 300 MG capsule Take 300 mg by mouth 3 (three) times daily. (Patient not taking: Reported on 10/12/2024)     ibuprofen (ADVIL) 800 MG tablet Take 800 mg by mouth 3 (three) times daily. (Patient taking differently: Take 800 mg by mouth every 12 (twelve) hours.)     ipratropium (ATROVENT) 0.06 % nasal spray Place 1 spray into both nostrils daily.     lactulose  (CHRONULAC ) 10 GM/15ML solution TAKE 15 TO 30 ML AS DIRECTED UP TO 2 TIMES DAILY FOR CONSTIPATION 5400 mL 0   levothyroxine  (SYNTHROID , LEVOTHROID) 88 MCG tablet Take 88 mcg by mouth daily before breakfast.     linaclotide  (LINZESS ) 290 MCG CAPS capsule Take 1 capsule (290 mcg total) by mouth daily before breakfast. 90 capsule 0   liothyronine (CYTOMEL) 5 MCG tablet Take 5 mcg by mouth daily.     Magnesium Citrate (MAGNESIUM GUMMIES PO) Take 125 mg by mouth 2 (two) times daily.     metFORMIN  (GLUCOPHAGE ) 500 MG tablet 1 po TID with food 90 tablet 0   methylphenidate  (RITALIN ) 10 MG tablet Take 10 mg by mouth 2 (two) times daily.     Multiple Vitamins-Minerals (MULTIVITAMIN GUMMIES ADULT PO) Take 1 each by mouth daily.     nystatin cream (MYCOSTATIN) Apply 1 application  topically daily as needed for dry skin.     Olopatadine HCl 0.2 % SOLN 1 drop into affected eye Ophthalmic Once a day or prn for 30 days     omeprazole  (PRILOSEC) 20 MG capsule Take 20 mg by mouth 2  (two) times daily before a meal.     ondansetron  (ZOFRAN -ODT) 4 MG disintegrating tablet Take 1 tablet (4 mg total) by mouth every 8 (eight) hours as needed for nausea or vomiting. 20 tablet 11   Oxycodone  HCl 10 MG TABS Take 10 mg by mouth every 6 (six) hours as needed. (Patient taking differently: Take 10 mg by mouth every 4 (four) hours.)     potassium chloride  (K-DUR,KLOR-CON ) 10 MEQ tablet Take 1 tablet by mouth daily.     progesterone  (PROMETRIUM ) 100 MG capsule Take 100 mg by mouth daily.     promethazine  (PHENERGAN ) 12.5 MG tablet Take 1 tablet (12.5 mg total) by mouth every 8 (eight) hours as needed for nausea or vomiting. (Patient not taking: Reported on 10/11/2024) 30 tablet 0   Propylene Glycol (SYSTANE COMPLETE) 0.6 % SOLN Apply 2 drops to eye.     rOPINIRole  (REQUIP ) 2 MG tablet Take 2 mg by mouth 2 (two) times daily.     tiZANidine (  ZANAFLEX) 4 MG tablet Take 4 mg by mouth 2 (two) times daily.     torsemide (DEMADEX) 20 MG tablet Take 20 mg by mouth 2 (two) times daily.     traZODone  (DESYREL ) 100 MG tablet Take 2 tablets (200 mg total) by mouth at bedtime. 180 tablet 3   Ubrogepant  (UBRELVY ) 100 MG TABS Take 1 tablet (100 mg total) by mouth daily as needed (at onset of headaches may repeat dose after 2 hours as needed max 200 mg in 24 hours). 16 tablet 11   Vitamin D -Vitamin K (VITAMIN K2-VITAMIN D3 PO) Take 1 each by mouth daily. Vitamin K2 100 mcg/Vitamin D  50 mcg per gummie     No current facility-administered medications for this visit.    Medication Side Effects: None  Allergies:  Allergies  Allergen Reactions   Tape Dermatitis and Itching    BURNING AND BLISTERING - USUALLY DOES OK WITH TEGADERM  Paper tape or Coban OK   Topiramate  Other (See Comments) and Shortness Of Breath    Really wierd feeling  Off balance; cloudy thinking  Really wierd feeling Off balance; cloudy thinking    Off balance; cloudy thinking   Cyclobenzaprine Other (See Comments)    Other  reaction(s): Unknown Hallucinations Unknown    Flexeril [Cyclobenzaprine Hcl] Other (See Comments)    hallucinations   Meloxicam Other (See Comments)   Naltrexone Other (See Comments)    SEVERE PAIN ALL OVER   Porcine (Pork) Protein Other (See Comments)    Other reaction(s): Other (See Comments) Migraines Other reaction(s): Other (See Comments) Migraines   Sumatriptan Other (See Comments)    Difficulty breathing Other reaction(s): Other (see comments) Off balance    Wound Dressing Adhesive Other (See Comments)    Tape - Burns and blisters; caused skin infection   Brexpiprazole Other (See Comments)    Rexulti     Restless arms   Glycopyrrolate  Other (See Comments)    Urinary retention   Imitrex [Sumatriptan Base] Other (See Comments)    Pt unsure of allergic reaction/intolerance   Lamotrigine  Other (See Comments)    Pt unsure of allergy/intolerance   Sulfa Antibiotics Other (See Comments)    Pt states she does not think she is allergic to Sulfa     Past Medical History:  Diagnosis Date   Allergic rhinitis    Allergy    Anemia    Anxiety disorder    Back pain    Bilateral swelling of feet    Bronchitis    Cancer (HCC)    skin   Chronic fatigue    Chronic fatigue    Chronic headaches    Chronic pain    Constipation    Depression    Endometriosis    Family history of colon cancer    mother,uncles,aunts   Fibromyalgia    Fibromyalgia    Gallbladder problem    GERD (gastroesophageal reflux disease)    Hyperlipidemia    Hypothyroidism    IBS (irritable bowel syndrome)    Insomnia    Iron  deficiency    Joint pain    Laryngopharyngeal reflux (LPR)    Obesity    Osteoarthritis    Personal history of colonic polyps 04/2006   polypoid   PONV (postoperative nausea and vomiting)    RLS (restless legs syndrome)    Sleep apnea    no cpap   SOB (shortness of breath)    Swelling    Vitamin B12 deficiency    Vitamin D   deficiency     Family History   Problem Relation Age of Onset   Obesity Mother    Depression Mother    Cancer Mother    Heart disease Mother    Hyperlipidemia Mother    Hypertension Mother    Colon cancer Mother        uncles,aunts   Sleep apnea Father    Cancer Father    Diabetes Father        paternal grandmother,sister   Leukemia Father    Lymphoma Father    Skin cancer Father    Heart disease Father    Colon polyps Sister    Diabetes Sister    Colon cancer Maternal Aunt    Colon cancer Maternal Uncle    Colon cancer Cousin    Colon polyps Cousin    Stomach cancer Neg Hx    Esophageal cancer Neg Hx    Rectal cancer Neg Hx     Social History   Socioeconomic History   Marital status: Significant Other    Spouse name: Leonce Ee   Number of children: 0   Years of education: Not on file   Highest education level: Not on file  Occupational History   Occupation: RN/disabled  Tobacco Use   Smoking status: Never   Smokeless tobacco: Never  Vaping Use   Vaping status: Never Used  Substance and Sexual Activity   Alcohol  use: Not Currently    Alcohol /week: 0.0 standard drinks of alcohol     Comment: rarely   Drug use: No   Sexual activity: Yes    Birth control/protection: Other-see comments  Other Topics Concern   Not on file  Social History Narrative   Patient is left-handed. She lives alone in a one level home. She has 3 cats. She is unable to exercise. Caffeine one soda / day. On disability now. 2 bachelor degrees   Social Drivers of Health   Financial Resource Strain: Low Risk  (04/06/2024)   Received from [facility]   Overall Financial Resource Strain (CARDIA)    Difficulty of Paying Living Expenses: Not hard at all  Food Insecurity: Low Risk  (08/05/2024)   Received from [facility]   Hunger Vital Sign    Within the past 12 months, you worried that your food would run out before you got money to buy more: Never true    Within the past 12 months, the food you bought just didn't last  and you didn't have money to get more. : Never true  Transportation Needs: No Transportation Needs (08/05/2024)   Received from [facility]    In the past 12 months, has lack of reliable transportation kept you from medical appointments, meetings, work or from getting things needed for daily living? : No  Physical Activity: Not on file  Stress: Not on file  Social Connections: Not on file  Intimate Partner Violence: Not on file    Past Medical History, Surgical history, Social history, and Family history were reviewed and updated as appropriate.   Please see review of systems for further details on the patient's review from today.   Objective:   Physical Exam:  There were no vitals taken for this visit.  Physical Exam Constitutional:      General: She is not in acute distress. Musculoskeletal:        General: No deformity.  Neurological:     Mental Status: She is alert and oriented to person, place, and time.  Coordination: Coordination normal.  Psychiatric:        Attention and Perception: Attention and perception normal. She does not perceive auditory or visual hallucinations.        Mood and Affect: Mood normal. Mood is not anxious or depressed. Affect is not labile, blunt, angry or inappropriate.        Speech: Speech normal.        Behavior: Behavior normal.        Thought Content: Thought content normal. Thought content is not paranoid or delusional. Thought content does not include homicidal or suicidal ideation. Thought content does not include homicidal or suicidal plan.        Cognition and Memory: Cognition and memory normal.        Judgment: Judgment normal.     Comments: Insight intact     Lab Review:     Component Value Date/Time   NA 141 05/25/2024 1214   K 4.2 05/25/2024 1214   CL 99 05/25/2024 1214   CO2 27 05/25/2024 1214   GLUCOSE 95 05/25/2024 1214   GLUCOSE 97 05/26/2019 1046   BUN 16 05/25/2024 1214   CREATININE 0.86  05/25/2024 1214   CALCIUM 9.2 05/25/2024 1214   PROT 6.3 05/25/2024 1214   ALBUMIN 4.2 05/25/2024 1214   AST 32 05/25/2024 1214   ALT 38 (H) 05/25/2024 1214   ALKPHOS 103 05/25/2024 1214   BILITOT 0.3 05/25/2024 1214       Component Value Date/Time   WBC 7.0 05/25/2024 1214   WBC 6.9 05/26/2019 1046   RBC 4.39 05/25/2024 1214   RBC 4.42 05/26/2019 1046   HGB 13.4 05/25/2024 1214   HCT 42.3 05/25/2024 1214   PLT 213 05/25/2024 1214   MCV 96 05/25/2024 1214   MCH 30.5 05/25/2024 1214   MCHC 31.7 05/25/2024 1214   MCHC 32.5 05/26/2019 1046   RDW 12.9 05/25/2024 1214   LYMPHSABS 2.2 05/25/2024 1214   MONOABS 0.6 05/26/2019 1046   EOSABS 0.1 05/25/2024 1214   BASOSABS 0.1 05/25/2024 1214    No results found for: POCLITH, LITHIUM   No results found for: PHENYTOIN, PHENOBARB, VALPROATE, CBMZ   .res Assessment: Plan:    Plan:  1. Wellbutrin  XL 150mg  daily 2. Cymbalta  60mg  BID 3. Trazadone 100mg  - 2 at hs  4. Vraylar  3mg  daily though patient assistance.  5. Clonazepam  0.5mg  BID - taking at bedtime 6. Ritalin  10mg  BID - thru PCP  Continue therapy with Bambi Cottle.   RTC 6 weeks  25 minutes spent dedicated to the care of this patient on the date of this encounter to include pre-visit review of records, ordering of medication, post visit documentation, and face-to-face time with the patient discussing GAD, MDD, PTSD and insomnia. Discussed continuing current medication regimen.  Patient advised to contact office with any questions, adverse effects, or acute worsening in signs and symptoms.  Discussed potential metabolic side effects associated with atypical antipsychotics, as well as potential risk for movement side effects. Advised pt to contact office if movement side effects occur.   Discussed potential benefits, risk, and side effects of benzodiazepines to include potential risk of tolerance and dependence, as well as possible drowsiness. Advised patient  not to drive if experiencing drowsiness and to take lowest possible effective dose to minimize risk of dependence and tolerance.    Diagnoses and all orders for this visit:  Generalized anxiety disorder -     buPROPion  (WELLBUTRIN  XL) 150 MG 24 hr tablet; Take 1  tablet (150 mg total) by mouth every morning. -     DULoxetine  (CYMBALTA ) 60 MG capsule; Take 1 capsule (60 mg total) by mouth 2 (two) times daily. -     clonazePAM  (KLONOPIN ) 0.5 MG tablet; Take 1 tablet (0.5 mg total) by mouth 2 (two) times daily.  Major depressive disorder, recurrent episode, moderate (HCC) -     buPROPion  (WELLBUTRIN  XL) 150 MG 24 hr tablet; Take 1 tablet (150 mg total) by mouth every morning.  PTSD (post-traumatic stress disorder) -     DULoxetine  (CYMBALTA ) 60 MG capsule; Take 1 capsule (60 mg total) by mouth 2 (two) times daily.  Insomnia, unspecified type -     DULoxetine  (CYMBALTA ) 60 MG capsule; Take 1 capsule (60 mg total) by mouth 2 (two) times daily. -     traZODone  (DESYREL ) 100 MG tablet; Take 2 tablets (200 mg total) by mouth at bedtime.     Please see After Visit Summary for patient specific instructions.  Future Appointments  Date Time Provider Department Center  11/02/2024  2:20 PM Midge Sober, DO [facility] None  11/02/2024  4:00 PM Elnor Channing FALCON, PT [facility]  11/09/2024  4:00 PM Elnor Channing FALCON, PT [facility]  11/10/2024  2:00 PM Cottle, Peggye MATSU, LCSW [facility] None  11/12/2024 10:00 AM Marilynne Rosaline SAILOR, MD [facility]  11/16/2024  4:00 PM Elnor Channing FALCON, PT [facility]  11/23/2024  2:00 PM Theodoro Lakes, MD [facility] [address]  12/07/2024 11:00 AM Midge Sober, DO [facility] None  12/08/2024  2:00 PM Cottle, Peggye MATSU, LCSW [facility] None  01/05/2025  2:00 PM Cottle, Bambi G, LCSW [facility] None  02/02/2025  2:00 PM Cottle, Bambi G, LCSW [facility] None  02/23/2025  2:00 PM Cottle, Bambi G, LCSW [facility] None  09/13/2025 10:50 AM [name], Adam R, DO [facility] None    No orders of the  defined types were placed in this encounter.     -------------------------------

## 2024-10-26 NOTE — Telephone Encounter (Signed)
 Noted

## 2024-10-27 ENCOUNTER — Ambulatory Visit

## 2024-11-01 DIAGNOSIS — Z981 Arthrodesis status: Secondary | ICD-10-CM | POA: Diagnosis not present

## 2024-11-01 DIAGNOSIS — M5116 Intervertebral disc disorders with radiculopathy, lumbar region: Secondary | ICD-10-CM | POA: Diagnosis not present

## 2024-11-02 ENCOUNTER — Encounter (INDEPENDENT_AMBULATORY_CARE_PROVIDER_SITE_OTHER): Payer: Self-pay | Admitting: Family Medicine

## 2024-11-02 ENCOUNTER — Ambulatory Visit (INDEPENDENT_AMBULATORY_CARE_PROVIDER_SITE_OTHER): Admitting: Family Medicine

## 2024-11-02 ENCOUNTER — Encounter: Payer: Self-pay | Admitting: Physical Therapy

## 2024-11-02 ENCOUNTER — Encounter: Attending: Obstetrics and Gynecology | Admitting: Physical Therapy

## 2024-11-02 DIAGNOSIS — L905 Scar conditions and fibrosis of skin: Secondary | ICD-10-CM | POA: Diagnosis present

## 2024-11-02 DIAGNOSIS — E88819 Insulin resistance, unspecified: Secondary | ICD-10-CM | POA: Diagnosis not present

## 2024-11-02 DIAGNOSIS — K5904 Chronic idiopathic constipation: Secondary | ICD-10-CM | POA: Diagnosis not present

## 2024-11-02 DIAGNOSIS — Z6838 Body mass index (BMI) 38.0-38.9, adult: Secondary | ICD-10-CM

## 2024-11-02 DIAGNOSIS — M6281 Muscle weakness (generalized): Secondary | ICD-10-CM | POA: Insufficient documentation

## 2024-11-02 DIAGNOSIS — R278 Other lack of coordination: Secondary | ICD-10-CM | POA: Diagnosis present

## 2024-11-02 DIAGNOSIS — E559 Vitamin D deficiency, unspecified: Secondary | ICD-10-CM

## 2024-11-02 DIAGNOSIS — R102 Pelvic and perineal pain unspecified side: Secondary | ICD-10-CM | POA: Insufficient documentation

## 2024-11-02 MED ORDER — METFORMIN HCL 500 MG PO TABS: ORAL_TABLET | ORAL | 0 refills | Status: DC

## 2024-11-02 NOTE — Therapy (Signed)
 OUTPATIENT PHYSICAL THERAPY FEMALE PELVIC TREATMENT   Patient Name: Paige [name] MRN: [id number] DOB:1966/08/09, 58 y.o., female Today's Date: 11/02/2024  END OF SESSION:  PT End of Session - 11/02/24 1559     Visit Number 8    Date for Recertification  11/18/24    Authorization Type [employer] medicare    Authorization - Visit Number 8    Authorization - Number of Visits 10    PT Start Time 1600    PT Stop Time 1645    PT Time Calculation (min) 45 min    Activity Tolerance Patient tolerated treatment well    Behavior During Therapy WFL for tasks assessed/performed          Past Medical History:  Diagnosis Date   Allergic rhinitis    Allergy    Anemia    Anxiety disorder    Back pain    Bilateral swelling of feet    Bronchitis    Cancer (HCC)    skin   Chronic fatigue    Chronic fatigue    Chronic headaches    Chronic pain    Constipation    Depression    Endometriosis    Family history of colon cancer    mother,uncles,aunts   Fibromyalgia    Fibromyalgia    Gallbladder problem    GERD (gastroesophageal reflux disease)    Hyperlipidemia    Hypothyroidism    IBS (irritable bowel syndrome)    Insomnia    Iron  deficiency    Joint pain    Laryngopharyngeal reflux (LPR)    Obesity    Osteoarthritis    Personal history of colonic polyps 04/2006   polypoid   PONV (postoperative nausea and vomiting)    RLS (restless legs syndrome)    Sleep apnea    no cpap   SOB (shortness of breath)    Swelling    Vitamin B12 deficiency    Vitamin D  deficiency    Past Surgical History:  Procedure Laterality Date   ANTERIOR CRUCIATE LIGAMENT REPAIR Left 2003   APPENDECTOMY  1993   BACK SURGERY  03/2024   spinal cord stimulator placed   CARPAL TUNNEL RELEASE Left 09/25/2017   Procedure: LEFT CARPAL TUNNEL RELEASE;  Surgeon: Murrell Kuba, MD;  Location: [facility];  Service: Orthopedics;  Laterality: Left;   CARPAL TUNNEL RELEASE Right     CHOLECYSTECTOMY  1999   COLONOSCOPY  2009   patterson   DE QUERVAIN'S RELEASE     LUMBAR LAMINECTOMY     L4- S1   PANNICULECTOMY     POLYPECTOMY     REPAIR ANKLE LIGAMENT Left 2001   ROUX-EN-Y GASTRIC BYPASS  04/2004   Dr Donnice Lunger   TONSILLECTOMY AND ADENOIDECTOMY  1976   TRIGGER FINGER RELEASE     Patient Active Problem List   Diagnosis Date Noted   High-tone pelvic floor dysfunction 05/18/2024   SUI (stress urinary incontinence, female) 05/18/2024   Overactive bladder 05/18/2024   Incontinence of feces 05/18/2024   Vaginal atrophy 05/18/2024   Chronic idiopathic constipation 05/18/2024   Nutritional deficiency 03/17/2024   OSA on CPAP 03/17/2024   Encounter for orthopedic follow-up care 01/31/2020   Pain of left hand 12/19/2018   Radial styloid tenosynovitis 12/19/2018   Trigger finger 12/19/2018   Abscess of postoperative wound of abdominal wall 10/05/2018   Pannus, abdominal 09/21/2018   Pigmented skin lesion 06/23/2018   Cervicalgia 02/12/2018   Bilateral carpal tunnel syndrome 08/13/2017  Primary osteoarthritis of both first carpometacarpal joints 08/13/2017   Muscle tension dysphonia 08/12/2016   Vocal cord atrophy 08/12/2016   Hoarseness 07/02/2016   Laryngopharyngeal reflux (LPR) 07/02/2016   Cough 05/23/2016   Dyspnea 05/23/2016   Chronic pain 05/22/2016   Sacrococcygeal disorders, not elsewhere classified 11/24/2015   Anemia, iron  deficiency 08/24/2015   Fibrositis 08/17/2015   Abnormal weight gain 08/17/2015   Anxiety 08/17/2015   Recurrent major depressive episodes 08/17/2015   History of migraine headaches 08/17/2015   Neurosis, posttraumatic 08/17/2015   Bariatric surgery status 08/17/2015   DDD (degenerative disc disease), lumbar 01/18/2015   Facet joint syndrome 01/18/2015   Thoracic spondylosis 01/18/2015   Myofascial pain 02/22/2013   Back ache 07/09/2012   Family history of malignant neoplasm of gastrointestinal tract 06/10/2011    History of colonic polyps 06/10/2011   Esophageal reflux 06/10/2011   Cellulitis 06/10/2011   History of Roux-en-Y gastric bypass 06/10/2011   B12 nutritional deficiency 06/10/2011   Morbid obesity due to excess calories (HCC) 06/10/2011   Personal history of fibromyalgia 06/10/2011   Recurrent major depressive disorder, in partial remission 06/10/2011   Acid reflux 06/10/2011    PCP: Viona Fitch, MD  REFERRING PROVIDER: Marilynne Rosaline SAILOR, MD   REFERRING DIAG:  M62.89 (ICD-10-CM) - High-tone pelvic floor dysfunction  N39.3 (ICD-10-CM) - SUI (stress urinary incontinence, female)  N32.81 (ICD-10-CM) - Overactive bladder  R15.9 (ICD-10-CM) - Incontinence of feces, unspecified fecal incontinence type    THERAPY DIAG:  Muscle weakness (generalized)  Other lack of coordination  Scar  Pelvic pain  Rationale for Evaluation and Treatment: Rehabilitation  ONSET DATE: 12/2023  SUBJECTIVE:                                                                                                                                                                                           SUBJECTIVE STATEMENT: Lower abdominal pain is manageable. I have not been able to have a bowel movement on my own. I see the GI doctor on Thursday. Patient woke up 3 times at night to urinate. Urinary leakage is 80% better.   Fluid intake:  100oz water, 1 small bottle diet dr pepper per day. Has an occasional coffee. Usually only has sips of water before bed.   FUNCTIONAL LIMITATIONS: Patient uses a stick for walking  PERTINENT HISTORY:  Surgeries: Cholecystectomy; appendectomy; back surgery with spinal cored stimulator; Roux-EN-Y gastric bypass Other: Skin Caner; Fibromyalgia; IBS; Hypothyroidism;    PAIN:  Are you having pain? Yes NPRS scale: 5/10 10/19/24: pain level is 6/10 Pain location: lower abdominal area  Pain type: aching Pain description: constant  Aggravating factors: needing to  have a bowel movement and not able to Relieving factors: heat at one tim  PRECAUTIONS: Other: skin cancer; spinal cord stimulator  RED FLAGS: None   WEIGHT BEARING RESTRICTIONS: No  FALLS:  Has patient fallen in last 6 months? No  OCCUPATION: disability  ACTIVITY LEVEL : Patient has stopped exercising since 9/1 due to back pain  PLOF: Independent  PATIENT GOALS: be able to urinate better, not have to use an enema to have a bowel movement, reduce pelvic pain   BOWEL MOVEMENT: Pain with bowel movement: Yes, 8-9/10 Type of bowel movement:0-4 time(s) per week ; strains; Type 1, hard rocks Fully empty rectum: No Leakage: Consistency with leakage: soft                                                      Caused by: small amounts weekly, usually after an incomplete BM  Pads: Yes: depends at night Fiber supplement/laxative stool softener (dulcolax 5mg  BID), Ibrelsa BID   URINATION: Pain with urination: Yes, burning of retention Fully empty bladder: Nonot able to urinate right away, sometimes needs to run water; she will wipe then urinate again                          Post-void dribble: No Stream: Weak Urgency: No Frequency:during the day  8+                                                           Nocturia: Yes:  2-6 times per night to void    10/05/24: wakes up 2 times Leakage: Coughing, Sneezing, Laughing, and without sensation Pads/briefs:  0-2 pads per day ; depends  at night  INTERCOURSE:  Ability to have vaginal penetration Yes  Pain with intercourse: Initial Penetration and Deep Penetration Dryness: Yes    PROLAPSE: None   OBJECTIVE:  Note: Objective measures were completed at Evaluation unless otherwise noted.  DIAGNOSTIC FINDINGS:  Pelvic floor strength I/V, puborectalis I/V external anal sphincter I/V   Pelvic floor musculature: Right levator tender, Right obturator tender, Left levator tender, Left obturator tender. High tone throughout Post Void Residual  10 mL      COGNITION: Overall cognitive status: Within functional limits for tasks assessed     SENSATION: Light touch: Appears intact   GAIT: Assistive device utilized: walking stick  POSTURE: rounded shoulders, forward head, and decreased lumbar lordosis; Right shoulder higher; patient had scoliosos   LUMBARAROM/PROM:not tested due to fusion    LOWER EXTREMITY MNF:qloo bilateral hip ROM   LOWER EXTREMITY MMT:  MMT Right eval Left eval  Hip flexion 4/5 4/5  Hip extension 4/5 4/5  Hip abduction 3/5 3/5   (Blank rows = not tested) PALPATION:  Abdominal: tenderness of the lower abdomen; increased lower rib cage angle  Diastasis: No Distortion: No  Breathing: contract the abdominals and will raise the lower rib cage Scar tissue: Yes: along the lower abdomen, lower umbilicus,                 External Perineal Exam: redness along the posterior introitus, tenderness  located on the perineal body and around the rectum                             Internal Pelvic Floor: therapist was not able to get the index finger past the DIP joint into the vaginal canal due to pain; going through the rectum the puborectalis did not move forward well, tenderness throughout the levator ani and obturator internist and in the rectum.   Patient confirms identification and approves PT to assess internal pelvic floor and treatment Yes No emotional/communication barriers or cognitive limitation. Patient is motivated to learn. Patient understands and agrees with treatment goals and plan. PT explains patient will be examined in standing, sitting, and lying down to see how their muscles and joints work. When they are ready, they will be asked to remove their underwear so PT can examine their perineum. The patient is also given the option of providing their own chaperone as one is not provided in our facility. The patient also has the right and is explained the right to defer or refuse any part of the  evaluation or treatment including the internal exam. With the patient's consent, PT will use one gloved finger to gently assess the muscles of the pelvic floor, seeing how well it contracts and relaxes and if there is muscle symmetry. After, the patient will get dressed and PT and patient will discuss exam findings and plan of care. PT and patient discuss plan of care, schedule, attendance policy and HEP activities.   PELVIC MMT:   MMT eval 09/16/24 10/05/24  Vaginal 2/5 4/5   Internal Anal Sphincter 2/5  2/5  External Anal Sphincter 2/5  2/5  Puborectalis 2/5  2/5  (Blank rows = not tested)        TONE: Increased tone of the pelvic floor  PROLAPSE: none  TODAY'S TREATMENT:  11/02/24 Manual: Soft tissue mobilization: ILOVEU massage to the abdomen to assist with stool moving through the intestines and heard bowel noises.  Scar tissue mobilization: Manual work to the lower abdominal scar to improve mobility Myofascial release: Fascial release to the mesenteric root, lower abdomen. Along the right quadrant, and around the upper abdomen to reduce the restrictions and help with the stool to move throug.  Neuromuscular re-education: Down training: Sitting on the ball and performing diaphragmatic breathing to relax the pelvic floor then generate force to bear down Exercises: Stretches/mobility: Figure 4 stretch in supine holding 30 sec each Single knee to chest holding 30 sec each Supine rock the knees back and forth to increase trunk rotation Strengthening: Transverse abdominus contraction with pressing hands into the mat.    10/19/24 Manual: Soft tissue mobilization:  Manual work to the diaphragm I Love U abdominal massage to promote peristalic motion of the intestines to assist with movement of stool Scar tissue mobilization: Manual work to the lower abdominal scar going through the different layers of restrictions Tissue rolling along the scar Using a suction cup to pull up  on the tissue to release the restrictions Myofascial release: Fascial release along the mons pubs and lower abdomen to reduce the restrictions    10/05/24 Manual: Internal pelvic floor techniques: No emotional/communication barriers or cognitive limitation. Patient is motivated to learn. Patient understands and agrees with treatment goals and plan. PT explains patient will be examined in standing, sitting, and lying down to see how their muscles and joints work. When they are ready, they will be asked  to remove their underwear so PT can examine their perineum. The patient is also given the option of providing their own chaperone as one is not provided in our facility. The patient also has the right and is explained the right to defer or refuse any part of the evaluation or treatment including the internal exam. With the patient's consent, PT will use one gloved finger to gently assess the muscles of the pelvic floor, seeing how well it contracts and relaxes and if there is muscle symmetry. After, the patient will get dressed and PT and patient will discuss exam findings and plan of care. PT and patient discuss plan of care, schedule, attendance policy and HEP activities.  Therapist gloved finger in the rectum and vaginal canal working on the perineal body, along the levator ani, obturator internist, one finger on the side of the bladder and other finger on the anococcygeal ligament, release from origin to insertion of the muscles Externally working around the perineal body, along the external anal sphincter, levator ani, and obturator internist Neuromuscular re-education: Pelvic floor contraction training: Therapist gloved finger in the rectum with patient working on pushing the finger out of the rectum and not tightening instead. Patient was lying on her side. She needed cues to fill her abdomen up and use different breathing out techniques.      PATIENT EDUCATION:  09/23/24 Education details:  Access Code: [email or web address], scar massage Person educated: Patient Education method: Explanation, Demonstration, Tactile cues, Verbal cues, and Handouts Education comprehension: verbalized understanding, returned demonstration, verbal cues required, tactile cues required, and needs further education  HOME EXERCISE PROGRAM: 09/23/24 Access Code: [email or web address] URL: [email or web address] Date: 09/23/2024 Prepared by: Channing Pereyra  Exercises - Supine Diaphragmatic Breathing  - 2 x daily - 7 x weekly - 1 sets - 10 reps - Seated Diaphragmatic Breathing  - 2 x daily - 7 x weekly - 1 sets - 10 reps - Shoulder Extension with Resistance - 90/90 on Swiss Ball  - 1 x daily - 7 x weekly - 1 sets - 10 reps - Hooklying Anti-Rotation Press With Anchored Resistance  - 1 x daily - 7 x weekly - 1 sets - 10 reps - Hooklying Transversus Abdominis Palpation  - 1 x daily - 7 x weekly - 1 sets - 10 reps - Standing Shoulder Extension with Resistance  - 1 x daily - 3 x weekly - 1 sets - 10 reps - Standing Diagonal Shoulder Extension with Anchored Resistance  - 1 x daily - 3 x weekly - 1 sets - 10 reps - Standing Shoulder Flexion with Posterior Anchored Resistance  - 1 x daily - 3 x weekly - 1 sets - 10 reps  Patient Education - Abdominal Massage for Constipation - Abdominal Massage for Constipation   ASSESSMENT:  CLINICAL IMPRESSION: Patient is a 58 y.o. female who was seen today for physical therapy  treatment for high toned pelvic floor, urinary and fecal incontinence. Patient has not had a bowel movement in 3 days. She does not feel the fullness in the rectum to feel the urge to have a bowel movement.  Therapy focused on improving the peristalic motion of the intestines to assist with a bowel movement. She had some gurgling noise with the manual work.Urinary leakage is 80% better. She is able to engage her lower abdomen with verbal cues and tactile cues. Lower abdominal scar has good mobility.  Patient will  benefit from skilled therapy to improve pelvic floor muscle excursion, reduce pain and  coordination to help with continence and toileting.   OBJECTIVE IMPAIRMENTS: decreased activity tolerance, decreased coordination, decreased strength, increased fascial restrictions, increased muscle spasms, and pain.   ACTIVITY LIMITATIONS: sleeping, continence, and toileting  PARTICIPATION LIMITATIONS: meal prep, cleaning, laundry, interpersonal relationship, shopping, and community activity  PERSONAL FACTORS: Cholecystectomy; appendectomy; back surgery with spinal cored stimulator; Roux-EN-Y gastric bypass are also affecting patient's functional outcome.   REHAB POTENTIAL: Excellent  CLINICAL DECISION MAKING: Unstable/unpredictable  EVALUATION COMPLEXITY: High   GOALS: Goals reviewed with patient? Yes  SHORT TERM GOALS: Target date: 09/23/24  Patient educated on abdominal massage to assist with peristalic motion of the intestines.  Baseline: Goal status: Met 09/02/24  2.  Patient educated on scar mobilization to improve mobility and reduce pain.  Baseline:  Goal status: Met 09/02/24  3.  Patient is able to perform diaphragmatic breathing to lengthen the pelvic floor to urinate and have a bowel movement.  Baseline:  Goal status: Met 09/09/24  4.  Patient educated on correct ways to have a bowel movement to reduce straining.  Baseline:  Goal status: Met 09/16/24  LONG TERM GOALS: Target date: 11/18/24  Patient independent with advanced HEP for core and pelvic floor strength to improve continence.  Baseline:  Goal status: INITIAL  2.  Patient is able to sit on commode and urinate within 15 seconds due to the ability to relax her pelvic floor.  Baseline:  Goal status: INITIAL  3.  Patient is able to sit on the commode to have a bowel movement and not strain 50% of the time due to the ability to relax the pelvic floor.  Baseline:  Goal status: INITIAL  4.  Patient has increased in  lower abdominal scar to reduce her lower abdominal pain </= 2/10.  Baseline:  Goal status: INITIAL  5.  Patient reports her urinary leakage decreased </= 70% due to the ability to fully relax the pelvic floor and contract and reduction of trigger points.  Baseline: urinary leakage decreased by 80% Goal status: Met 11/02/24   PLAN:  PT FREQUENCY: 1-2x/week  PT DURATION: 12 weeks  PLANNED INTERVENTIONS: 97110-Therapeutic exercises, 97530- Therapeutic activity, 97112- Neuromuscular re-education, 97535- Self Care, [id number]- Manual therapy, 20560 (1-2 muscles), 20561 (3+ muscles)- Dry Needling, Patient/Family education, Cryotherapy, Moist heat, and Biofeedback  PLAN FOR NEXT SESSION:  manual work on the outside of the vagina on the right, core exercises; ask if she has had a bowel movement  Channing Pereyra, PT 11/02/24 5:00 PM

## 2024-11-02 NOTE — Progress Notes (Signed)
 Paige DOROTHA [name], D.O.  ABFM, ABOM Specializing in Clinical Bariatric Medicine  Office located at: [address] [place], [place]  [address]   Obtain CMP, Vit D, Fasting Insulin , A1C and liver enzymes at next OV.  A) FOR THE CHRONIC DISEASE OF OBESITY:  Chief complaint: Obesity Paige [name] is here to discuss her progress with her obesity treatment plan.   History of present illness / Interval history:  Paige [name] is here today for her follow-up office visit.  Since last OV on 10/06/24, pt is down 9 lbs. Patient recently got engaged. She states that she recently had 2 colonoscopys. She endorses following meal plan really well until taking an elderly couple to [place] for Thanksgiving and she had no real choice for food.     10/06/24 11:00 11/02/24 14:00   Body Fat % 48.5 % 47.7 %  Muscle Mass (lbs) 109.2 lbs 107.6 lbs  Fat Mass (lbs) 108.4 lbs 101.4 lbs  Total Body Water (lbs) 85.8 lbs 83.4 lbs  Visceral Fat Rating  14 13    Counseling done on how various foods will affect these numbers and how to maximize success.   Total lbs lost to date: - 45 lbs Total Fat Mass in lbs lost to date: -21.8 lbs Total weight loss percentage to date: - 17.37 %    Morbid obesity (HCC) Starting BMI 40.57 BMI 38.0-38.9,adult current 33.51  Nutrition Therapy She is journaling 1000-1100 cal and 80+ grams protein and states she is following her eating plan approximately 90 % of the time.   - Tracking Calories/Macros: yes  - Eating More Whole Foods: yes  - Adequate Protein Intake: yes  - Adequate Water Intake: no   - Skipping Meals: no   - Sleeping 7-9 Hours/ Night: no    Paige [name] is currently in the action stage of change. As such, her goal is to continue weight management plan.  She has agreed to: continue current plan   Physical Activity Paige [name] is not exercising due to back pain but is moving a lot more.    Paige [name] has been advised to work up to 300-450 minutes of  moderate intensity aerobic activity a week and strengthening exercises 2-3 times per week for cardiovascular health, weight loss maintenance and preservation of muscle mass.  She has agreed to : Think about enjoyable ways to increase daily physical activity and overcoming barriers to exercise and Increase physical activity in their day and reduce sedentary time (increase NEAT).   Behavioral Modifications Evidence-based interventions for health behavior change were utilized today including the discussion of  1) self monitoring techniques:    - Continue journaling   2) SMART goals for next OV:    - Bring in journaling log at next OV.   Regarding patient's less desirable eating habits and patterns, we employed the technique of small changes.   We discussed the following today: continue to work on maintaining a reduced calorie state, getting the recommended amount of protein, incorporating whole foods, making healthy choices, staying well hydrated and practicing mindfulness when eating. Additional resources provided today: Eating out guide given and provided patient with personalized instruction on finding healthier options when eating out.     Medical Interventions/ Pharmacotherapy Previous Bariatric surgery: Roux-en-Y Pharmacotherapy for weight loss: She is currently taking Metformin  500 mg TID for medical weight loss.    We discussed various medication options to help Paige [name] with her weight loss efforts and we both agreed to : Adequate clinical response  to anti-obesity medication, continue current regimen   B) OBESITY RELATED CONDITIONS ADDRESSED TODAY:  Insulin  resistance Chronic idiopathic constipation Assessment & Plan Lab Results  Component Value Date   HGBA1C 5.5 05/25/2024   INSULIN  5.4 05/25/2024    On Metformin  500 mg TID. Patient reports good compliance and tolerance. Patient states that since starting Metformin  she feels like it has helped and is making a difference.  Patient denies having any GI upset. She endorses having constipation but denies taking Miralax due to GI not recommending the usage. Patient takes Linzess  290 mcg daily before breakfast but reports that it has not made much of a difference. She states that she will see GI in the near future. She reports having regular hunger signaling around meal times. She states that she has some cravings and feels snacky after dinner. Since patient feels like she has an increase in hunger and cravings mutually agreed to Increase Metformin  to 1 pill at breakfast and lunch and 2 pills at dinner. Patient denies having any history of kidney disease. Will recheck kidney functions at next OV. Recommended that patient incorporate healthy fats like avocados, olive oil, salmon or nuts. Continue following prudent meal plan and decreasing simple carbs and sugars.     Vitamin D  deficiency Assessment & Plan Lab Results  Component Value Date   VD25OH 81.5 05/25/2024   Patient is currently taking Vitamin K2 100 mcg/Vit D 50 mcg per gummie every day. Patient states that since last OV she has not changed her dosage to the recommended dose from that visit. Recommended that patient take her gummy every other day. Will recheck Vit D levels at next OV.     Medications Discontinued During This Encounter  Medication Reason   diazepam  (VALIUM ) 5 MG tablet Patient Preference   metFORMIN  (GLUCOPHAGE ) 500 MG tablet Reorder     Meds ordered this encounter  Medications   metFORMIN  (GLUCOPHAGE ) 500 MG tablet    Sig: 1 with B and L, 2 at D (4 tabs daily)    Dispense:  120 tablet    Refill:  0    30 d supply;  ** OV for RF **   Do not send RF request     Follow up:   Return 12/07/2024 at 11:00 AM  She was informed of the importance of frequent follow up visits to maximize her success with intensive lifestyle modifications for her multiple health conditions.   Weight Summary and Biometrics   Weight Lost Since Last Visit: 9  lb  Weight Gained Since Last Visit: 0   Vitals Temp: 98 F (36.7 C) BP: 104/68 Pulse Rate: 73 SpO2: 93 %   Anthropometric Measurements Height: 5' 7 (1.702 m) Weight: 214 lb (97.1 kg) BMI (Calculated): 33.51 Weight at Last Visit: 223 lb Weight Lost Since Last Visit: 9 lb Weight Gained Since Last Visit: 0 Starting Weight: 259 lb Total Weight Loss (lbs): 45 lb (20.4 kg) Peak Weight: 316 lb   Body Composition  Body Fat %: 47.7 % Fat Mass (lbs): 101.4 lbs Muscle Mass (lbs): 107.6 lbs Total Body Water (lbs): 83.4 lbs Visceral Fat Rating : 13   Other Clinical Data Fasting: No Labs: No Today's Visit #: 8 Starting Date: 05/25/24    Objective:   PHYSICAL EXAM: Blood pressure 104/68, pulse 73, temperature 98 F (36.7 C), height 5' 7 (1.702 m), weight 214 lb (97.1 kg), SpO2 93%. Body mass index is 33.52 kg/m.  General: she is overweight, cooperative and in no acute  distress. PSYCH: Has normal mood, affect and thought process.   HEENT: EOMI, sclerae are anicteric. Lungs: Normal breathing effort, no conversational dyspnea. Extremities: Moves * 4 Neurologic: A and O * 3, good insight  DIAGNOSTIC DATA REVIEWED: BMET    Component Value Date/Time   NA 141 05/25/2024 1214   K 4.2 05/25/2024 1214   CL 99 05/25/2024 1214   CO2 27 05/25/2024 1214   GLUCOSE 95 05/25/2024 1214   GLUCOSE 97 05/26/2019 1046   BUN 16 05/25/2024 1214   CREATININE 0.86 05/25/2024 1214   CALCIUM 9.2 05/25/2024 1214   Lab Results  Component Value Date   HGBA1C 5.5 05/25/2024   Lab Results  Component Value Date   INSULIN  5.4 05/25/2024   Lab Results  Component Value Date   TSH 1.430 05/25/2024   CBC    Component Value Date/Time   WBC 7.0 05/25/2024 1214   WBC 6.9 05/26/2019 1046   RBC 4.39 05/25/2024 1214   RBC 4.42 05/26/2019 1046   HGB 13.4 05/25/2024 1214   HCT 42.3 05/25/2024 1214   PLT 213 05/25/2024 1214   MCV 96 05/25/2024 1214   MCH 30.5 05/25/2024 1214   MCHC  31.7 05/25/2024 1214   MCHC 32.5 05/26/2019 1046   RDW 12.9 05/25/2024 1214   Iron  Studies    Component Value Date/Time   IRON  94 05/25/2024 1214   TIBC 334 05/25/2024 1214   FERRITIN 49 05/25/2024 1214   IRONPCTSAT 28 05/25/2024 1214   Lipid Panel     Component Value Date/Time   CHOL 170 05/25/2024 1214   TRIG 61 05/25/2024 1214   HDL 64 05/25/2024 1214   CHOLHDL 2.7 05/25/2024 1214   LDLCALC 94 05/25/2024 1214   Hepatic Function Panel     Component Value Date/Time   PROT 6.3 05/25/2024 1214   ALBUMIN 4.2 05/25/2024 1214   AST 32 05/25/2024 1214   ALT 38 (H) 05/25/2024 1214   ALKPHOS 103 05/25/2024 1214   BILITOT 0.3 05/25/2024 1214   BILIDIR 0.1 05/12/2017 1222      Component Value Date/Time   TSH 1.430 05/25/2024 1214   Nutritional Lab Results  Component Value Date   VD25OH 81.5 05/25/2024    Attestations:   LILLETTE Sonny Laroche, acting as a stage manager for Paige Jenkins, DO., have compiled all relevant documentation for today's office visit on behalf of Paige Jenkins, DO, while in the presence of [employer], DO.   I have reviewed the above documentation for accuracy and completeness, and I agree with the above. Paige [name], D.O.  The 21st Century Cures Act was signed into law in 2016 which includes the topic of electronic health records.  This provides immediate access to information in MyChart.  This includes consultation notes, operative notes, office notes, lab results and pathology reports.  If you have any questions about what you read please let us  know at your next visit so we can discuss your concerns and take corrective action if need be.  We are right here with you.

## 2024-11-03 NOTE — Progress Notes (Unsigned)
 11/04/2024 Paige [name] [id number] 03/07/1966  Referring provider: Jefferey Fitch, MD Primary GI doctor: Dr. Federico (set up with Dr. San for colonoscopy in Dr. Lafonda absence)  ASSESSMENT AND PLAN:  Constipation/IBS with fecal incontinence rare BM once a week, increasing flatuance Occ fecal leakage and bowel incontinence She is on oxycodone  for back pain, 10 6 pills a day, spinal cord stimulator Following with urogyn, currently doing pelvic floor PT Dulcolax/senokot fecal incontinence mag citrate, amitiza , isbrela did not help movantik ( 45 a month and still expensive),  motegrity  was too expensive Linzess  290 and lactulose  once a day 30 ml and still has to use enemas. She has significant gas, has sensation to have BM but nothing happens -Continue pelvic PT, Consider ARM -Continue enemas as needed  Opioid-induced constipation with fecal incontinence Chronic constipation and fecal incontinence due to opioids and possible pelvic floor dysfunction. Previous treatments ineffective. Financial constraints limit medication options. Current medications contribute to symptoms. - Increase lactulose  to up to three times daily as needed. - Attempt to obtain Motegrity  through patient assistance programs. - Consider combining Linzess  with Amitiza  or Motegrity . - Perform bowel purge with colonoscopy prep and Senokot when convenient. - Discuss with other physicians for additional treatment suggestions. - follow up pain management, need to try to consider decreasing opioid medications, consider alternative pain control  Dysphagia s/p empiric dilation April 2025, barium swallow tertiary contractions, continues to have dysphagia History of Roux-en-Y 03/25/2019 EGD for IDA White plaques distal esophagus healthy anastomosis, 2 small jejunal erosions 02/25/2024 barium swallow poor esophageal motility stasis of contrast in the esophagus tertiary contractions 13 mm barium tablet  passed without difficulty. 03/22/24 EGD for dysphagia unremarkable esophagus status post empiric dilation, gastric polyp, healthy-appearing anastomosis normal jejunum.  Path showed reflux esophagitis negative EOE, negative H. pylori, benign polyp - continue the omeprazole  BID -given dysmotility information - likely opioid-induced esophageal dysfunction, will discuss with physician if worse scheduling for esophageal manometry,  -  talk with pain management about alternative pain options  Screening colonoscopy 10/04/2021 with Dr. Aneita for personal history of adenomatous polyps family history of colon cancer showed 4 polyps ranging 7 to 10 mm sigmoid colon transverse descending, small lipoma transverse recall 3 years  10/11/2024 colonoscopy Dr. San with poor preparation of the colon after 2-day prep small lipoma paroxysmal transverse colon submucosal nodule rectum 10/12/2024 colonoscopy with additional prep shows 2 sessile polyps descending colon 4 to 5 mm 7 mm polyp sigmoid colon single erosion in the rectum.  Repeat colonoscopy in 1 year with extended 2-day prep   Status post Roux-en-Y Follow up PCP  [facility] On Bipap since may 2024  Obesity  Body mass index is 35.16 kg/m.  -Patient has been advised to make an attempt to improve diet and exercise patterns to aid in weight loss. -Recommended diet heavy in fruits and veggies and low in animal meats, cheeses, and dairy products, appropriate calorie intake   Patient Care Team: Jefferey Fitch, MD as PCP - General (Internal Medicine) Skeet Juliene SAUNDERS, DO as Consulting Physician (Neurology)  HISTORY OF PRESENT ILLNESS: 58 y.o. female with a past medical history of Roux-en-Y, constipation/IBS, IDA, GERD and others listed below presents for evaluation of Constipation and dysphagia.   I last saw the patient in the office 09/01/2024 for IBS, drug-induced constipation, esophageal dysphagia with history of Roux-en-Y  Discussed the use of AI  scribe software for clinical note transcription with the patient, who gave verbal consent to proceed.  History  of Present Illness   Paige [name] is a 58 year old female who presents with persistent bowel movement difficulties.  She has persistent constipation despite multiple interventions. A colonoscopy was repeated due to inadequate bowel preparation despite a two-day prep and additional magnesium citrate. She is currently on opioids, taking 10 mg six times a day, which may contribute to her symptoms.  She has tried various medications for constipation including Linzess  290 mcg, which she is currently taking without improvement, and lactulose  30 mL once daily, which has not been effective. Amitiza  24 mcg twice daily was previously tried but was not tolerated. Movantik and Motegrity  were considered but were too expensive. None of these medications have provided relief.  She resorts to enemas when other methods fail, but they only result in the passage of hard stools and do not fully relieve her symptoms. She lacks sensation to have a bowel movement, although sometimes she feels the need but is unable to pass stool. She experiences significant gas and abdominal discomfort.  She has attempted pelvic floor physical therapy, including exercises and massage, which occasionally result in passing small amounts of stool. She has also tried Senokot and Dulcolax, but these cause fecal incontinence, particularly at night.  She experiences constant thirst and dry mouth, which may be related to her medication use, including torasemide and Vraylar .        She  reports that she has never smoked. She has never used smokeless tobacco. She reports that she does not currently use alcohol . She reports that she does not use drugs.  RELEVANT GI HISTORY, IMAGING AND LABS:  CBC    Component Value Date/Time   WBC 7.0 05/25/2024 1214   WBC 6.9 05/26/2019 1046   RBC 4.39 05/25/2024 1214   RBC 4.42  05/26/2019 1046   HGB 13.4 05/25/2024 1214   HCT 42.3 05/25/2024 1214   PLT 213 05/25/2024 1214   MCV 96 05/25/2024 1214   MCH 30.5 05/25/2024 1214   MCHC 31.7 05/25/2024 1214   MCHC 32.5 05/26/2019 1046   RDW 12.9 05/25/2024 1214   LYMPHSABS 2.2 05/25/2024 1214   MONOABS 0.6 05/26/2019 1046   EOSABS 0.1 05/25/2024 1214   BASOSABS 0.1 05/25/2024 1214   Recent Labs    05/25/24 1214  HGB 13.4    CMP     Component Value Date/Time   NA 141 05/25/2024 1214   K 4.2 05/25/2024 1214   CL 99 05/25/2024 1214   CO2 27 05/25/2024 1214   GLUCOSE 95 05/25/2024 1214   GLUCOSE 97 05/26/2019 1046   BUN 16 05/25/2024 1214   CREATININE 0.86 05/25/2024 1214   CALCIUM 9.2 05/25/2024 1214   PROT 6.3 05/25/2024 1214   ALBUMIN 4.2 05/25/2024 1214   AST 32 05/25/2024 1214   ALT 38 (H) 05/25/2024 1214   ALKPHOS 103 05/25/2024 1214   BILITOT 0.3 05/25/2024 1214      Latest Ref Rng & Units 05/25/2024   12:14 PM 05/26/2019   10:46 AM 05/12/2017   12:22 PM  Hepatic Function  Total Protein 6.0 - 8.5 g/dL 6.3  6.9  6.5   Albumin 3.8 - 4.9 g/dL 4.2  4.2  4.2   AST 0 - 40 IU/L 32  36  34   ALT 0 - 32 IU/L 38  30  49   Alk Phosphatase 44 - 121 IU/L 103  98  63   Total Bilirubin 0.0 - 1.2 mg/dL 0.3  0.4  0.3  Bilirubin, Direct 0.0 - 0.3 mg/dL   0.1       Current Medications:   Current Outpatient Medications (Endocrine & Metabolic):    estradiol  (ESTRACE ) 0.5 MG tablet, Take 0.5 mg by mouth daily.   levothyroxine  (SYNTHROID , LEVOTHROID) 88 MCG tablet, Take 88 mcg by mouth daily before breakfast.   liothyronine (CYTOMEL) 5 MCG tablet, Take 5 mcg by mouth daily.   metFORMIN  (GLUCOPHAGE ) 500 MG tablet, 1 with B and L, 2 at D (4 tabs daily)   progesterone  (PROMETRIUM ) 100 MG capsule, Take 100 mg by mouth daily.  Current Outpatient Medications (Cardiovascular):    torsemide (DEMADEX) 20 MG tablet, Take 20 mg by mouth 2 (two) times daily.  Current Outpatient Medications (Respiratory):     fluticasone (FLONASE SENSIMIST) 27.5 MCG/SPRAY nasal spray, 2 sprays (1 spray in each nostril) Nasally Once a day during seasons of difficulty for 30 days   ipratropium (ATROVENT) 0.06 % nasal spray, Place 1 spray into both nostrils daily.   promethazine  (PHENERGAN ) 12.5 MG tablet, Take 1 tablet (12.5 mg total) by mouth every 8 (eight) hours as needed for nausea or vomiting.   Albuterol-Budesonide (AIRSUPRA ) 90-80 MCG/ACT AERO, Inhale 1 each into the lungs 2 (two) times daily. (Patient not taking: Reported on 11/04/2024)  Current Outpatient Medications (Analgesics):    Erenumab -aooe (AIMOVIG ) 70 MG/ML SOAJ, INJECT 70 MG INTO THE SKIN EVERY 28 DAYS.   ibuprofen (ADVIL) 800 MG tablet, Take 800 mg by mouth 3 (three) times daily. (Patient taking differently: Take 800 mg by mouth every 12 (twelve) hours.)   Oxycodone  HCl 10 MG TABS, Take 10 mg by mouth every 6 (six) hours as needed. (Patient taking differently: Take 10 mg by mouth every 4 (four) hours.)   Ubrogepant  (UBRELVY ) 100 MG TABS, Take 1 tablet (100 mg total) by mouth daily as needed (at onset of headaches may repeat dose after 2 hours as needed max 200 mg in 24 hours).  Current Outpatient Medications (Hematological):    Fe Fum-Fe Poly-Vit C-Lactobac (FUSION) 65-65-25-30 MG CAPS, Take by mouth.  Current Outpatient Medications (Other):    buPROPion  (WELLBUTRIN  XL) 150 MG 24 hr tablet, Take 1 tablet (150 mg total) by mouth every morning.   cariprazine  (VRAYLAR ) 3 MG capsule, Take 1 capsule (3 mg total) by mouth daily.   clonazePAM  (KLONOPIN ) 0.5 MG tablet, Take 1 tablet (0.5 mg total) by mouth 2 (two) times daily.   DULoxetine  (CYMBALTA ) 60 MG capsule, Take 1 capsule (60 mg total) by mouth 2 (two) times daily.   estradiol  (ESTRACE ) 0.1 MG/GM vaginal cream, Place 0.5 g vaginally 2 (two) times a week. Place 0.5g nightly for two weeks then twice a week after   lactulose  (CHRONULAC ) 10 GM/15ML solution, TAKE 15 TO 30 ML AS DIRECTED UP TO 2 TIMES  DAILY FOR CONSTIPATION   linaclotide  (LINZESS ) 290 MCG CAPS capsule, Take 1 capsule (290 mcg total) by mouth daily before breakfast.   lubiprostone  (AMITIZA ) 24 MCG capsule, Take 1 capsule (24 mcg total) by mouth 2 (two) times daily with a meal.   Magnesium Citrate (MAGNESIUM GUMMIES PO), Take 125 mg by mouth 2 (two) times daily.   methylphenidate  (RITALIN ) 10 MG tablet, Take 10 mg by mouth 2 (two) times daily.   Multiple Vitamins-Minerals (MULTIVITAMIN GUMMIES ADULT PO), Take 1 each by mouth daily.   nystatin cream (MYCOSTATIN), Apply 1 application  topically daily as needed for dry skin.   Olopatadine HCl 0.2 % SOLN, 1 drop into affected eye Ophthalmic Once  a day or prn for 30 days   omeprazole  (PRILOSEC) 20 MG capsule, Take 20 mg by mouth 2 (two) times daily before a meal.   ondansetron  (ZOFRAN -ODT) 4 MG disintegrating tablet, Take 1 tablet (4 mg total) by mouth every 8 (eight) hours as needed for nausea or vomiting.   polyethylene glycol-electrolytes (NULYTELY) 420 g solution, do 6 oz every 30 mins until the impaction passes, can do 1/2 gallon on first day and 1/2 gallon on 2nd day.  Can continue 2 fleets enemas a day until it resolves.   potassium chloride  (K-DUR,KLOR-CON ) 10 MEQ tablet, Take 1 tablet by mouth daily.   Propylene Glycol (SYSTANE COMPLETE) 0.6 % SOLN, Apply 2 drops to eye.   rOPINIRole  (REQUIP ) 2 MG tablet, Take 2 mg by mouth 2 (two) times daily.   tiZANidine (ZANAFLEX) 4 MG tablet, Take 4 mg by mouth 2 (two) times daily.   traZODone  (DESYREL ) 100 MG tablet, Take 2 tablets (200 mg total) by mouth at bedtime.   Vitamin D -Vitamin K (VITAMIN K2-VITAMIN D3 PO), Take 1 each by mouth daily. Vitamin K2 100 mcg/Vitamin D  50 mcg per gummie   gabapentin  (NEURONTIN ) 300 MG capsule, Take 300 mg by mouth 3 (three) times daily. (Patient not taking: Reported on 11/04/2024)  Medical History:  Past Medical History:  Diagnosis Date   Allergic rhinitis    Allergy    Anemia    Anxiety  disorder    Back pain    Bilateral swelling of feet    Bronchitis    Cancer (HCC)    skin   Chronic fatigue    Chronic fatigue    Chronic headaches    Chronic pain    Constipation    Depression    Endometriosis    Family history of colon cancer    mother,uncles,aunts   Fibromyalgia    Fibromyalgia    Gallbladder problem    GERD (gastroesophageal reflux disease)    Hyperlipidemia    Hypothyroidism    IBS (irritable bowel syndrome)    Insomnia    Iron  deficiency    Joint pain    Laryngopharyngeal reflux (LPR)    Obesity    Osteoarthritis    Personal history of colonic polyps 04/2006   polypoid   PONV (postoperative nausea and vomiting)    RLS (restless legs syndrome)    Sleep apnea    no cpap   SOB (shortness of breath)    Swelling    Vitamin B12 deficiency    Vitamin D  deficiency    Allergies:  Allergies  Allergen Reactions   Tape Dermatitis and Itching    BURNING AND BLISTERING - USUALLY DOES OK WITH TEGADERM  Paper tape or Coban OK   Topiramate  Other (See Comments) and Shortness Of Breath    Really wierd feeling  Off balance; cloudy thinking  Really wierd feeling Off balance; cloudy thinking    Off balance; cloudy thinking   Cyclobenzaprine Other (See Comments)    Other reaction(s): Unknown Hallucinations Unknown    Flexeril [Cyclobenzaprine Hcl] Other (See Comments)    hallucinations   Meloxicam Other (See Comments)   Naltrexone Other (See Comments)    SEVERE PAIN ALL OVER   Porcine (Pork) Protein Other (See Comments)    Other reaction(s): Other (See Comments) Migraines Other reaction(s): Other (See Comments) Migraines   Sumatriptan Other (See Comments)    Difficulty breathing Other reaction(s): Other (see comments) Off balance    Wound Dressing Adhesive Other (See Comments)  Tape - Burns and blisters; caused skin infection   Brexpiprazole Other (See Comments)    Rexulti     Restless arms   Glycopyrrolate  Other (See Comments)     Urinary retention   Imitrex [Sumatriptan Base] Other (See Comments)    Pt unsure of allergic reaction/intolerance   Lamotrigine  Other (See Comments)    Pt unsure of allergy/intolerance   Sulfa Antibiotics Other (See Comments)    Pt states she does not think she is allergic to Sulfa      Surgical History:  She  has a past surgical history that includes Roux-en-Y Gastric Bypass (04/2004); Appendectomy (1993); Cholecystectomy (1999); Repair ankle ligament (Left, 2001); Anterior cruciate ligament repair (Left, 2003); Colonoscopy (2009); Carpal tunnel release (Left, 09/25/2017); Tonsillectomy and adenoidectomy (1976); Carpal tunnel release (Right); Polypectomy; Back surgery (03/2024); Lumbar laminectomy; Panniculectomy; Trigger finger release; and De Quervain's release. Family History:  Her family history includes Cancer in her father and mother; Colon cancer in her cousin, maternal aunt, maternal uncle, and mother; Colon polyps in her cousin and sister; Depression in her mother; Diabetes in her father and sister; Heart disease in her father and mother; Hyperlipidemia in her mother; Hypertension in her mother; Leukemia in her father; Lymphoma in her father; Obesity in her mother; Skin cancer in her father; Sleep apnea in her father.  REVIEW OF SYSTEMS  : All other systems reviewed and negative except where noted in the History of Present Illness.  PHYSICAL EXAM: BP 114/62   Pulse 73   Ht 5' 7 (1.702 m)   Wt 224 lb 8 oz (101.8 kg)   BMI 35.16 kg/m  Physical Exam   GENERAL APPEARANCE: Obese, in no apparent distress. HEENT: No cervical lymphadenopathy, unremarkable thyroid , sclerae anicteric, conjunctiva pink. RESPIRATORY: Respiratory effort normal, breath sounds equal bilaterally without rales, rhonchi, or wheezing. CARDIO: Regular rate and rhythm with no murmurs, rubs, or gallops, peripheral pulses intact. ABDOMEN: Soft, non-distended, active bowel sounds in all four quadrants, mild tenderness  over entire abdomen with increased tenderness in the epigastric region, no rebound, no mass appreciated. RECTAL: not evaluated MUSCULOSKELETAL: Full range of motion, normal gait, without edema. SKIN: Dry, intact without rashes or lesions. No jaundice. NEURO: Alert, oriented, no focal deficits. PSYCH: Cooperative, normal mood and affect.      Alan JONELLE Coombs, PA-C 2:07 PM

## 2024-11-04 ENCOUNTER — Ambulatory Visit: Admitting: Physician Assistant

## 2024-11-04 ENCOUNTER — Encounter: Payer: Self-pay | Admitting: Physician Assistant

## 2024-11-04 VITALS — BP 114/62 | HR 73 | Ht 67.0 in | Wt 224.5 lb

## 2024-11-04 DIAGNOSIS — M6289 Other specified disorders of muscle: Secondary | ICD-10-CM

## 2024-11-04 DIAGNOSIS — R1310 Dysphagia, unspecified: Secondary | ICD-10-CM | POA: Diagnosis not present

## 2024-11-04 DIAGNOSIS — R159 Full incontinence of feces: Secondary | ICD-10-CM | POA: Diagnosis not present

## 2024-11-04 DIAGNOSIS — Z9884 Bariatric surgery status: Secondary | ICD-10-CM

## 2024-11-04 DIAGNOSIS — K581 Irritable bowel syndrome with constipation: Secondary | ICD-10-CM

## 2024-11-04 DIAGNOSIS — K5903 Drug induced constipation: Secondary | ICD-10-CM

## 2024-11-04 DIAGNOSIS — E538 Deficiency of other specified B group vitamins: Secondary | ICD-10-CM

## 2024-11-04 DIAGNOSIS — E669 Obesity, unspecified: Secondary | ICD-10-CM | POA: Diagnosis not present

## 2024-11-04 MED ORDER — LUBIPROSTONE 24 MCG PO CAPS: 24.0000 ug | ORAL_CAPSULE | Freq: Two times a day (BID) | ORAL | 0 refills | Status: DC

## 2024-11-04 MED ORDER — PEG 3350-KCL-NA BICARB-NACL 420 G PO SOLR: ORAL | 0 refills | Status: AC

## 2024-11-04 NOTE — Patient Instructions (Addendum)
 _______________________________________________________  If your blood pressure at your visit was 140/90 or greater, please contact your primary care physician to follow up on this.  _______________________________________________________  If you are age 59 or older, your body mass index should be between 23-30. Your Body mass index is 35.16 kg/m. If this is out of the aforementioned range listed, please consider follow up with your Primary Care Provider.  If you are age 98 or younger, your body mass index should be between 19-25. Your Body mass index is 35.16 kg/m. If this is out of the aformentioned range listed, please consider follow up with your Primary Care Provider.   ________________________________________________________  The [facility] providers would like to encourage you to use MYCHART to communicate with providers for non-urgent requests or questions.  Due to long hold times on the telephone, sending your provider a message by [facility] may be a faster and more efficient way to get a response.  Please allow 48 business hours for a response.  Please remember that this is for non-urgent requests.  _______________________________________________________  [facility] is using a team-based approach to care.  Your team is made up of your doctor and two to three APPS. Our APPS (Nurse Practitioners and Physician Assistants) work with your physician to ensure care continuity for you. They are fully qualified to address your health concerns and develop a treatment plan. They communicate directly with your gastroenterologist to care for you. Seeing the Advanced Practice Practitioners on your physician's team can help you by facilitating care more promptly, often allowing for earlier appointments, access to diagnostic testing, procedures, and other specialty referrals.    Please send in [place]- take as directed- but do 6 oz every 30 mins until the impaction passes, can do 1/2 gallon  on first day and 1/2 gallon on 2nd day.  Can continue 2 fleets enemas a day until it resolves.  Go to ER if any severe AB pain, vomiting/unable to hold down food/drink, blood in stool, or unable to pass gas.  Try amitiza  and linzess   VISIT SUMMARY:  Today, we discussed your ongoing issues with constipation and abdominal discomfort. We reviewed your current medications and their potential impact on your symptoms, and we have made some adjustments to your treatment plan to help manage your condition more effectively.  YOUR PLAN:  OPIOID-INDUCED CONSTIPATION WITH FECAL INCONTINENCE: Your chronic constipation and fecal incontinence are likely due to your opioid use and possible pelvic floor dysfunction. Previous treatments have not been effective, and financial constraints limit some medication options. -Increase lactulose  to up to three times daily as needed. -We will attempt to obtain Motegrity  through patient assistance programs. -Consider combining Linzess  with Amitiza  or Motegrity . -Perform a bowel purge with colonoscopy prep and Senokot when convenient. -We will discuss with other physicians for additional treatment suggestions.  SPLENIC FLEXURE SYNDROME: You have abdominal pain and constipation due to stool and gas trapping at the splenic flexure, which is worsened by opioids and pelvic floor dysfunction. -We will continue to monitor and manage your symptoms with the adjusted treatment plan for constipation.

## 2024-11-05 ENCOUNTER — Other Ambulatory Visit (HOSPITAL_COMMUNITY): Payer: Self-pay

## 2024-11-05 ENCOUNTER — Other Ambulatory Visit: Payer: Self-pay

## 2024-11-05 MED ORDER — LUBIPROSTONE 24 MCG PO CAPS: 24.0000 ug | ORAL_CAPSULE | Freq: Two times a day (BID) | ORAL | 0 refills | Status: DC

## 2024-11-05 NOTE — Telephone Encounter (Signed)
 Called pharmacy. Medication is filled for a 90 day supply and the $185 co-pay is with her insurance applied. There is no prior authorization needed. Price will decrease if day supply changed to 30 day but I was unable to get this information from the pharmacy tech.

## 2024-11-09 ENCOUNTER — Ambulatory Visit (INDEPENDENT_AMBULATORY_CARE_PROVIDER_SITE_OTHER): Payer: Self-pay | Admitting: Family Medicine

## 2024-11-09 ENCOUNTER — Encounter: Admitting: Physical Therapy

## 2024-11-09 DIAGNOSIS — H00024 Hordeolum internum left upper eyelid: Secondary | ICD-10-CM | POA: Diagnosis not present

## 2024-11-10 ENCOUNTER — Ambulatory Visit: Admitting: Psychology

## 2024-11-10 DIAGNOSIS — F331 Major depressive disorder, recurrent, moderate: Secondary | ICD-10-CM | POA: Diagnosis not present

## 2024-11-10 DIAGNOSIS — F4310 Post-traumatic stress disorder, unspecified: Secondary | ICD-10-CM | POA: Diagnosis not present

## 2024-11-10 DIAGNOSIS — F411 Generalized anxiety disorder: Secondary | ICD-10-CM
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
## 2024-11-12 ENCOUNTER — Ambulatory Visit: Admitting: Obstetrics and Gynecology

## 2024-11-12 ENCOUNTER — Encounter: Payer: Self-pay | Admitting: Obstetrics and Gynecology

## 2024-11-12 VITALS — BP 100/69 | HR 79

## 2024-11-12 DIAGNOSIS — M6289 Other specified disorders of muscle: Secondary | ICD-10-CM

## 2024-11-12 DIAGNOSIS — N3281 Overactive bladder: Secondary | ICD-10-CM

## 2024-11-12 MED ORDER — MIRABEGRON ER 25 MG PO TB24: 25.0000 mg | ORAL_TABLET | Freq: Every day | ORAL | 5 refills | Status: DC

## 2024-11-12 MED ORDER — DIAZEPAM 5 MG PO TABS: ORAL_TABLET | ORAL | 1 refills | Status: AC

## 2024-11-12 NOTE — Assessment & Plan Note (Signed)
-   Would not be a good candidate for anticholinergics due to severe refractory constipation. Ordered Myrbetriq to see if this can get covered. 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

## 2024-11-12 NOTE — Assessment & Plan Note (Signed)
-   refilled vaginal valium  - Discussed option of pelvic floor trigger point injections as well

## 2024-11-12 NOTE — Progress Notes (Signed)
 [facility] Return Visit  SUBJECTIVE  History of Present Illness: Paige [name] is a 58 y.o. female seen in follow-up for high tone pelvic floor, incontinence, constipation. Last visit was referred to pelvic PT but she has her first appt in Sept.   Constipation has still been an issue. This is the most bothersome for her Either she does not go at all of has loose stools with leakage. She has been seeing GI and trying to get movantic covered.   She has been doing pelvic PT. Pain comes and goes. Has noticed some improvement . She was doing the valium  a few times a week with some improvement but ran out.   She is having frequency and urge incontinence. Has improved some with PT.   Past Medical History: Patient  has a past medical history of Allergic rhinitis, Allergy, Anemia, Anxiety disorder, Back pain, Bilateral swelling of feet, Bronchitis, Cancer (HCC), Chronic fatigue, Chronic fatigue, Chronic headaches, Chronic pain, Constipation, Depression, Endometriosis, Family history of colon cancer, Fibromyalgia, Fibromyalgia, Gallbladder problem, GERD (gastroesophageal reflux disease), Hyperlipidemia, Hypothyroidism, IBS (irritable bowel syndrome), Insomnia, Iron  deficiency, Joint pain, Laryngopharyngeal reflux (LPR), Obesity, Osteoarthritis, Personal history of colonic polyps (04/2006), PONV (postoperative nausea and vomiting), RLS (restless legs syndrome), Sleep apnea, SOB (shortness of breath), Swelling, Vitamin B12 deficiency, and Vitamin D  deficiency.   Past Surgical History: She  has a past surgical history that includes Roux-en-Y Gastric Bypass (04/2004); Appendectomy (1993); Cholecystectomy (1999); Repair ankle ligament (Left, 2001); Anterior cruciate ligament repair (Left, 2003); Colonoscopy (2009); Carpal tunnel release (Left, 09/25/2017); Tonsillectomy and adenoidectomy (1976); Carpal tunnel release (Right); Polypectomy; Back surgery (03/2024); Lumbar laminectomy;  Panniculectomy; Trigger finger release; and De Quervain's release.   Medications: She has a current medication list which includes the following prescription(s): airsupra , bupropion , cariprazine , clonazepam , diazepam , duloxetine , aimovig , estradiol , estradiol , fusion, flonase sensimist, gabapentin , ibuprofen, ipratropium, lactulose , levothyroxine , linaclotide , liothyronine, lubiprostone , magnesium citrate, metformin , methylphenidate , multiple vitamins-minerals, nystatin cream, olopatadine hcl, omeprazole , ondansetron , oxycodone  hcl, polyethylene glycol-electrolytes, potassium chloride , progesterone , promethazine , systane complete, ropinirole , tizanidine, torsemide, trazodone , ubrelvy , vitamin d -vitamin k, and mirabegron er.   Allergies: Patient is allergic to tape, topiramate , cyclobenzaprine, flexeril [cyclobenzaprine hcl], meloxicam, naltrexone, porcine (pork) protein, sumatriptan, wound dressing adhesive, brexpiprazole, glycopyrrolate , imitrex [sumatriptan base], lamotrigine , and sulfa antibiotics.   Social History: Patient  reports that she has never smoked. She has never used smokeless tobacco. She reports that she does not currently use alcohol . She reports that she does not use drugs.     OBJECTIVE     Physical Exam: Vitals:   11/12/24 1013  BP: 100/69  Pulse: 79   Gen: No apparent distress, A&O x 3.  Detailed Urogynecologic Evaluation:  Deferred.    ASSESSMENT AND PLAN    Paige [name] is a 58 y.o. with:  1. High-tone pelvic floor dysfunction   2. Overactive bladder      High-tone pelvic floor dysfunction Assessment & Plan: - refilled vaginal valium  - Discussed option of pelvic floor trigger point injections as well  Orders: -     diazePAM ; Place 1 tablet vaginally nightly as needed for muscle spasm/ pelvic pain.  Dispense: 60 tablet; Refill: 1  Overactive bladder Assessment & Plan: - Would not be a good candidate for anticholinergics due to severe refractory  constipation. Ordered Myrbetriq to see if this can get covered. 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    Orders: -     Mirabegron ER; Take 1 tablet (25 mg total) by mouth daily.  Dispense: 30 tablet; Refill: 5   Return in about 2 months (around 01/13/2025).    Paige LOISE Caper, MD

## 2024-11-16 ENCOUNTER — Encounter: Admitting: Physical Therapy

## 2024-11-16 ENCOUNTER — Encounter: Payer: Self-pay | Admitting: Physical Therapy

## 2024-11-16 DIAGNOSIS — H00014 Hordeolum externum left upper eyelid: Secondary | ICD-10-CM | POA: Diagnosis not present

## 2024-11-16 DIAGNOSIS — K5909 Other constipation: Secondary | ICD-10-CM | POA: Diagnosis not present

## 2024-11-16 DIAGNOSIS — R102 Pelvic and perineal pain unspecified side: Secondary | ICD-10-CM

## 2024-11-16 DIAGNOSIS — L905 Scar conditions and fibrosis of skin: Secondary | ICD-10-CM

## 2024-11-16 DIAGNOSIS — M6281 Muscle weakness (generalized): Secondary | ICD-10-CM | POA: Diagnosis not present

## 2024-11-16 DIAGNOSIS — R278 Other lack of coordination: Secondary | ICD-10-CM

## 2024-11-16 DIAGNOSIS — M549 Dorsalgia, unspecified: Secondary | ICD-10-CM | POA: Diagnosis not present

## 2024-11-16 DIAGNOSIS — R5382 Chronic fatigue, unspecified: Secondary | ICD-10-CM | POA: Diagnosis not present

## 2024-11-16 NOTE — Therapy (Signed)
 OUTPATIENT PHYSICAL THERAPY FEMALE PELVIC TREATMENT   Patient Name: Paige [name] MRN: [id number] DOB:1966-06-06, 58 y.o., female Today's Date: 11/16/2024  END OF SESSION:  PT End of Session - 11/16/24 1601     Visit Number 9    Date for Recertification  11/18/24    Authorization - Visit Number 9    Authorization - Number of Visits 10    PT Start Time 1600    PT Stop Time 1645    PT Time Calculation (min) 45 min    Activity Tolerance Patient tolerated treatment well    Behavior During Therapy WFL for tasks assessed/performed          Past Medical History:  Diagnosis Date   Allergic rhinitis    Allergy    Anemia    Anxiety disorder    Back pain    Bilateral swelling of feet    Bronchitis    Cancer (HCC)    skin   Chronic fatigue    Chronic fatigue    Chronic headaches    Chronic pain    Constipation    Depression    Endometriosis    Family history of colon cancer    mother,uncles,aunts   Fibromyalgia    Fibromyalgia    Gallbladder problem    GERD (gastroesophageal reflux disease)    Hyperlipidemia    Hypothyroidism    IBS (irritable bowel syndrome)    Insomnia    Iron  deficiency    Joint pain    Laryngopharyngeal reflux (LPR)    Obesity    Osteoarthritis    Personal history of colonic polyps 04/2006   polypoid   PONV (postoperative nausea and vomiting)    RLS (restless legs syndrome)    Sleep apnea    no cpap   SOB (shortness of breath)    Swelling    Vitamin B12 deficiency    Vitamin D  deficiency    Past Surgical History:  Procedure Laterality Date   ANTERIOR CRUCIATE LIGAMENT REPAIR Left 2003   APPENDECTOMY  1993   BACK SURGERY  03/2024   spinal cord stimulator placed   CARPAL TUNNEL RELEASE Left 09/25/2017   Procedure: LEFT CARPAL TUNNEL RELEASE;  Surgeon: Murrell Kuba, MD;  Location: [facility];  Service: Orthopedics;  Laterality: Left;   CARPAL TUNNEL RELEASE Right    CHOLECYSTECTOMY  1999   COLONOSCOPY  2009    [facility]   DE QUERVAIN'S RELEASE     LUMBAR LAMINECTOMY     L4- S1   PANNICULECTOMY     POLYPECTOMY     REPAIR ANKLE LIGAMENT Left 2001   ROUX-EN-Y GASTRIC BYPASS  04/2004   Dr Donnice Lunger   TONSILLECTOMY AND ADENOIDECTOMY  1976   TRIGGER FINGER RELEASE     Patient Active Problem List   Diagnosis Date Noted   High-tone pelvic floor dysfunction 05/18/2024   SUI (stress urinary incontinence, female) 05/18/2024   Overactive bladder 05/18/2024   Incontinence of feces 05/18/2024   Vaginal atrophy 05/18/2024   Chronic idiopathic constipation 05/18/2024   Nutritional deficiency 03/17/2024   OSA on CPAP 03/17/2024   Encounter for orthopedic follow-up care 01/31/2020   Pain of left hand 12/19/2018   Radial styloid tenosynovitis 12/19/2018   Trigger finger 12/19/2018   Abscess of postoperative wound of abdominal wall 10/05/2018   Pannus, abdominal 09/21/2018   Pigmented skin lesion 06/23/2018   Cervicalgia 02/12/2018   Bilateral carpal tunnel syndrome 08/13/2017   Primary osteoarthritis of both first carpometacarpal  joints 08/13/2017   Muscle tension dysphonia 08/12/2016   Vocal cord atrophy 08/12/2016   Hoarseness 07/02/2016   Laryngopharyngeal reflux (LPR) 07/02/2016   Cough 05/23/2016   Dyspnea 05/23/2016   Chronic pain 05/22/2016   Sacrococcygeal disorders, not elsewhere classified 11/24/2015   Anemia, iron  deficiency 08/24/2015   Fibrositis 08/17/2015   Abnormal weight gain 08/17/2015   Anxiety 08/17/2015   Recurrent major depressive episodes 08/17/2015   History of migraine headaches 08/17/2015   Neurosis, posttraumatic 08/17/2015   Bariatric surgery status 08/17/2015   DDD (degenerative disc disease), lumbar 01/18/2015   Facet joint syndrome 01/18/2015   Thoracic spondylosis 01/18/2015   Myofascial pain 02/22/2013   Back ache 07/09/2012   Family history of malignant neoplasm of gastrointestinal tract 06/10/2011   History of colonic polyps 06/10/2011    Esophageal reflux 06/10/2011   Cellulitis 06/10/2011   History of Roux-en-Y gastric bypass 06/10/2011   B12 nutritional deficiency 06/10/2011   Morbid obesity due to excess calories (HCC) 06/10/2011   Personal history of fibromyalgia 06/10/2011   Recurrent major depressive disorder, in partial remission 06/10/2011   Acid reflux 06/10/2011    PCP: Viona Fitch, MD  REFERRING PROVIDER: Marilynne Rosaline SAILOR, MD   REFERRING DIAG:  M62.89 (ICD-10-CM) - High-tone pelvic floor dysfunction  N39.3 (ICD-10-CM) - SUI (stress urinary incontinence, female)  N32.81 (ICD-10-CM) - Overactive bladder  R15.9 (ICD-10-CM) - Incontinence of feces, unspecified fecal incontinence type    THERAPY DIAG:  Muscle weakness (generalized)  Other lack of coordination  Scar  Pelvic pain  Rationale for Evaluation and Treatment: Rehabilitation  ONSET DATE: 12/2023  SUBJECTIVE:                                                                                                                                                                                           SUBJECTIVE STATEMENT: Patient is not leaking urine. Patient will leak stool when she takes a laxative.     Fluid intake:  100oz water, 1 small bottle diet dr pepper per day. Has an occasional coffee. Usually only has sips of water before bed.   FUNCTIONAL LIMITATIONS: Patient uses a stick for walking  PERTINENT HISTORY:  Surgeries: Cholecystectomy; appendectomy; back surgery with spinal cored stimulator; Roux-EN-Y gastric bypass Other: Skin Caner; Fibromyalgia; IBS; Hypothyroidism;    PAIN:  Are you having pain? Yes NPRS scale: 5/10 10/19/24: pain level is 6/10 Pain location: lower abdominal area  Pain type: aching Pain description: constant   Aggravating factors: needing to have a bowel movement and not able to Relieving factors: heat at one tim  PRECAUTIONS: Other: skin cancer; spinal cord stimulator  RED  FLAGS: None   WEIGHT BEARING RESTRICTIONS: No  FALLS:  Has patient fallen in last 6 months? No  OCCUPATION: disability  ACTIVITY LEVEL : Patient has stopped exercising since 9/1 due to back pain  PLOF: Independent  PATIENT GOALS: be able to urinate better, not have to use an enema to have a bowel movement, reduce pelvic pain   BOWEL MOVEMENT: Pain with bowel movement: Yes, 8-9/10 Type of bowel movement:0-4 time(s) per week ; strains; Type 1, hard rocks Fully empty rectum: No Leakage: Consistency with leakage: soft                                                      Caused by: small amounts weekly, usually after an incomplete BM  Pads: Yes: depends at night Fiber supplement/laxative stool softener (dulcolax 5mg  BID), Ibrelsa BID   URINATION: Pain with urination: Yes, burning of retention Fully empty bladder: Nonot able to urinate right away, sometimes needs to run water; she will wipe then urinate again                          Post-void dribble: No Stream: Weak Urgency: No Frequency:during the day  8+                                                           Nocturia: Yes:  2-6 times per night to void    10/05/24: wakes up 2 times Leakage: Coughing, Sneezing, Laughing, and without sensation Pads/briefs:  0-2 pads per day ; depends  at night  INTERCOURSE:  Ability to have vaginal penetration Yes  Pain with intercourse: Initial Penetration and Deep Penetration Dryness: Yes    PROLAPSE: None   OBJECTIVE:  Note: Objective measures were completed at Evaluation unless otherwise noted.  DIAGNOSTIC FINDINGS:  Pelvic floor strength I/V, puborectalis I/V external anal sphincter I/V   Pelvic floor musculature: Right levator tender, Right obturator tender, Left levator tender, Left obturator tender. High tone throughout Post Void Residual 10 mL      COGNITION: Overall cognitive status: Within functional limits for tasks assessed     SENSATION: Light touch: Appears  intact   GAIT: Assistive device utilized: walking stick  POSTURE: rounded shoulders, forward head, and decreased lumbar lordosis; Right shoulder higher; patient had scoliosos   LUMBARAROM/PROM:not tested due to fusion    LOWER EXTREMITY MNF:qloo bilateral hip ROM   LOWER EXTREMITY MMT:  MMT Right eval Left eval  Hip flexion 4/5 4/5  Hip extension 4/5 4/5  Hip abduction 3/5 3/5   (Blank rows = not tested) PALPATION:  Abdominal: tenderness of the lower abdomen; increased lower rib cage angle  Diastasis: No Distortion: No  Breathing: contract the abdominals and will raise the lower rib cage Scar tissue: Yes: along the lower abdomen, lower umbilicus,                 External Perineal Exam: redness along the posterior introitus, tenderness located on the perineal body and around the rectum  Internal Pelvic Floor: therapist was not able to get the index finger past the DIP joint into the vaginal canal due to pain; going through the rectum the puborectalis did not move forward well, tenderness throughout the levator ani and obturator internist and in the rectum.   Patient confirms identification and approves PT to assess internal pelvic floor and treatment Yes No emotional/communication barriers or cognitive limitation. Patient is motivated to learn. Patient understands and agrees with treatment goals and plan. PT explains patient will be examined in standing, sitting, and lying down to see how their muscles and joints work. When they are ready, they will be asked to remove their underwear so PT can examine their perineum. The patient is also given the option of providing their own chaperone as one is not provided in our facility. The patient also has the right and is explained the right to defer or refuse any part of the evaluation or treatment including the internal exam. With the patient's consent, PT will use one gloved finger to gently assess the muscles  of the pelvic floor, seeing how well it contracts and relaxes and if there is muscle symmetry. After, the patient will get dressed and PT and patient will discuss exam findings and plan of care. PT and patient discuss plan of care, schedule, attendance policy and HEP activities.   PELVIC MMT:   MMT eval 09/16/24 10/05/24  Vaginal 2/5 4/5   Internal Anal Sphincter 2/5  2/5  External Anal Sphincter 2/5  2/5  Puborectalis 2/5  2/5  (Blank rows = not tested)        TONE: Increased tone of the pelvic floor  PROLAPSE: none  TODAY'S TREATMENT:  11/16/24 Manual: Soft tissue mobilization: Manual work to the pelvic floor externally going through her clothes due to patient leaking stool so not comfortable with therapist going internal Scar tissue mobilization: Manual work to the lower abdominal scar to improve mobility Myofascial release: Fascial release to the mesenteric root, lower abdomen. Along the right quadrant, and around the upper abdomen to reduce the restrictions and help with the stool to move through.  Self-care: Discussed with patient if she is using the valium  vaginally, bowel prep and enemas. Discussed when she will do them and how they may help her with her issues.     11/02/24 Manual: Soft tissue mobilization: ILOVEU massage to the abdomen to assist with stool moving through the intestines and heard bowel noises.  Scar tissue mobilization: Manual work to the lower abdominal scar to improve mobility Myofascial release: Fascial release to the mesenteric root, lower abdomen. Along the right quadrant, and around the upper abdomen to reduce the restrictions and help with the stool to move throug.  Neuromuscular re-education: Down training: Sitting on the ball and performing diaphragmatic breathing to relax the pelvic floor then generate force to bear down Exercises: Stretches/mobility: Figure 4 stretch in supine holding 30 sec each Single knee to chest holding 30 sec  each Supine rock the knees back and forth to increase trunk rotation Strengthening: Transverse abdominus contraction with pressing hands into the mat.    10/19/24 Manual: Soft tissue mobilization:  Manual work to the diaphragm I Love U abdominal massage to promote peristalic motion of the intestines to assist with movement of stool Scar tissue mobilization: Manual work to the lower abdominal scar going through the different layers of restrictions Tissue rolling along the scar Using a suction cup to pull up on the tissue to release the restrictions Myofascial release:  Fascial release along the mons pubs and lower abdomen to reduce the restrictions       PATIENT EDUCATION:  09/23/24 Education details: Access Code: [email or web address], scar massage Person educated: Patient Education method: Explanation, Demonstration, Tactile cues, Verbal cues, and Handouts Education comprehension: verbalized understanding, returned demonstration, verbal cues required, tactile cues required, and needs further education  HOME EXERCISE PROGRAM: 09/23/24 Access Code: [email or web address] URL: [email or web address] Date: 09/23/2024 Prepared by: Channing Pereyra  Exercises - Supine Diaphragmatic Breathing  - 2 x daily - 7 x weekly - 1 sets - 10 reps - Seated Diaphragmatic Breathing  - 2 x daily - 7 x weekly - 1 sets - 10 reps - Shoulder Extension with Resistance - 90/90 on Swiss Ball  - 1 x daily - 7 x weekly - 1 sets - 10 reps - Hooklying Anti-Rotation Press With Anchored Resistance  - 1 x daily - 7 x weekly - 1 sets - 10 reps - Hooklying Transversus Abdominis Palpation  - 1 x daily - 7 x weekly - 1 sets - 10 reps - Standing Shoulder Extension with Resistance  - 1 x daily - 3 x weekly - 1 sets - 10 reps - Standing Diagonal Shoulder Extension with Anchored Resistance  - 1 x daily - 3 x weekly - 1 sets - 10 reps - Standing Shoulder Flexion with Posterior Anchored Resistance  - 1 x daily - 3 x weekly - 1 sets - 10  reps  Patient Education - Abdominal Massage for Constipation - Abdominal Massage for Constipation   ASSESSMENT:  CLINICAL IMPRESSION: Patient is a 58 y.o. female who was seen today for physical therapy  treatment for high toned pelvic floor, urinary and fecal incontinence. Patient is not leaking urine. She sometimes  has trouble starting a urine stream.   Patient is using the squatty potty and breathing to urinate. Lower abdominal scar pain is 70% better. Patient has to strain to have a bowel movement. She will leak stool when she is taking the laxative that is prescribed. Patient will benefit from skilled therapy to improve pelvic floor muscle excursion, reduce pain and coordination to help with continence and toileting.   OBJECTIVE IMPAIRMENTS: decreased activity tolerance, decreased coordination, decreased strength, increased fascial restrictions, increased muscle spasms, and pain.   ACTIVITY LIMITATIONS: sleeping, continence, and toileting  PARTICIPATION LIMITATIONS: meal prep, cleaning, laundry, interpersonal relationship, shopping, and community activity  PERSONAL FACTORS: Cholecystectomy; appendectomy; back surgery with spinal cored stimulator; Roux-EN-Y gastric bypass are also affecting patient's functional outcome.   REHAB POTENTIAL: Excellent  CLINICAL DECISION MAKING: Unstable/unpredictable  EVALUATION COMPLEXITY: High   GOALS: Goals reviewed with patient? Yes  SHORT TERM GOALS: Target date: 09/23/24  Patient educated on abdominal massage to assist with peristalic motion of the intestines.  Baseline: Goal status: Met 09/02/24  2.  Patient educated on scar mobilization to improve mobility and reduce pain.  Baseline:  Goal status: Met 09/02/24  3.  Patient is able to perform diaphragmatic breathing to lengthen the pelvic floor to urinate and have a bowel movement.  Baseline:  Goal status: Met 09/09/24  4.  Patient educated on correct ways to have a bowel movement to  reduce straining.  Baseline:  Goal status: Met 09/16/24  LONG TERM GOALS: Target date: 11/18/24  Patient independent with advanced HEP for core and pelvic floor strength to improve continence.  Baseline:  Goal status: INITIAL  2.  Patient is able to sit on commode and urinate within 15 seconds due to the ability  to relax her pelvic floor.  Baseline:  Goal status: INITIAL  3.  Patient is able to sit on the commode to have a bowel movement and not strain 50% of the time due to the ability to relax the pelvic floor.  Baseline: Patient has to strain to have a bowel movement Goal status: INITIAL  4.  Patient has increased in lower abdominal scar to reduce her lower abdominal pain </= 2/10.  Baseline:  Goal status: Met 11/16/24  5.  Patient reports her urinary leakage decreased </= 70% due to the ability to fully relax the pelvic floor and contract and reduction of trigger points.  Baseline: urinary leakage decreased by 80% Goal status: Met 11/02/24   PLAN:  PT FREQUENCY: 1-2x/week  PT DURATION: 12 weeks  PLANNED INTERVENTIONS: 97110-Therapeutic exercises, 97530- Therapeutic activity, 97112- Neuromuscular re-education, 97535- Self Care, [id number]- Manual therapy, 20560 (1-2 muscles), 20561 (3+ muscles)- Dry Needling, Patient/Family education, Cryotherapy, Moist heat, and Biofeedback  PLAN FOR NEXT SESSION:  manual work on the outside of the vagina on the right, see if she has taken a vaginal valium   Channing Pereyra, PT 11/16/2024 4:57 PM

## 2024-11-23 ENCOUNTER — Ambulatory Visit (INDEPENDENT_AMBULATORY_CARE_PROVIDER_SITE_OTHER)

## 2024-11-23 VITALS — BP 110/80 | HR 69 | Temp 97.8°F | Ht 67.0 in | Wt 223.0 lb

## 2024-11-23 DIAGNOSIS — G4733 Obstructive sleep apnea (adult) (pediatric): Secondary | ICD-10-CM

## 2024-11-23 DIAGNOSIS — E66811 Obesity, class 1: Secondary | ICD-10-CM

## 2024-11-23 DIAGNOSIS — Z6834 Body mass index (BMI) 34.0-34.9, adult: Secondary | ICD-10-CM

## 2024-11-23 DIAGNOSIS — R400 Somnolence: Secondary | ICD-10-CM | POA: Diagnosis not present

## 2024-11-23 MED ORDER — ACETAZOLAMIDE 125 MG PO TABS: 125.0000 mg | ORAL_TABLET | Freq: Every day | ORAL | 0 refills | Status: AC

## 2024-11-23 NOTE — Progress Notes (Signed)
 "  New Patient Pulmonology Office Visit   Subjective:  Patient ID: Paige [name], female    DOB: 01/30/1966  MRN: [id number]  Referred by: Jefferey Fitch, MD  CC:  Chief Complaint  Patient presents with   Obstructive Sleep Apnea    No issues with machine; regular f/u     HPI Paige [name] is a 58 y.o. female with OSA, RLS, class 1 obesity, anxiety/depression follows psych, allergic rhinitis, hypothyroidism presents for evaluation of OSA. Transfer from [facility].  Used to be a engineer, civil (consulting).   08/19/24> on Ritalin  for drowsiness.  Poor sleep due to back pain.  CT chest without hilar lymphadenopathy.  OSA history: Hst may 2024:  dx w OSA. Unable to tolerate. Hence underwent titration study in sept 2024. Set up on BIPAP 15/10.    Anx/depression: on SNRI, trazodone , NDRI, clonazepam  and ritalin  and vraylar .    Restless legs- on requip  2mg  bid mg few years.was on gabapentin  which did not help.   Discussed the use of AI scribe software for clinical note transcription with the patient, who gave verbal consent to proceed.  History of Present Illness   Paige [name] is a 58 year old female with sleep apnea who presents for a follow-up visit.  She experiences ongoing issues with her sleep apnea despite consistent use of her BiPAP machine. She has frequent awakenings at night due to pain and experiences persistent daytime exhaustion. She continues to experience drowsiness despite an increased dose of Ritalin  at 20 mg twice a day. She discontinued gabapentin  due to adverse effects, including jitteriness.  Her sleep pattern involves going to bed around 9:30 to 10:00 PM, falling asleep by 11:00 PM, and waking up at 3:00 AM. She usually falls back asleep and wakes up between 6:00 and 7:00 AM. Despite this, she feels exhausted and unintentionally falls asleep during activities such as eating supper or attending church. She does not take naps during the  day.  She has a history of restless legs syndrome, managed by her primary care provider, and is currently on Requip . She reports dry mouth and snoring through her mask.  Occasional shortness of breath occurs, particularly when walking upstairs or long distances, but resolves once she reaches her destination. No history of smoking, asthma, or COPD. She has seasonal allergies but no family history of asthma. No wheezing, phlegm production, or allergic reactions to perfumes or chemicals.      Sleep routine:  -Bed: 10-11p. Quickly w trazodone .  -Nocturnal awakenings: 1-5 times. Because of pain or RLS.  -Wake: 5-6 a. Feels refreshed.  -Napping:since back pain she is taking more naps due to poor sleep at night.    Habits: -Caffeine: no -Alcohol : no -Nicotine:never.   PAP download compliance data: [employer] November-December 2025 Encore/Airview: resmed 11. Hours of usage: 6 hours 53 minutes. Days used >4hr: 28/30. Leak: Minimal AHI: 7.1 of which 6.3 was central.  PRIOR TESTS and IMAGING: PSG/HSAT: Split-night study 08/18/2023: AHI 18.1, O2 nadir 81%.  Titration CPAP failure.  BiPAP 22/18 recommended.  ECHO: 2013: EF 55%.  Spiro 2017: normal.   CT chest done outside 04/2024: I do not have images to review. Reportedly had right hilar adenopathy vs mass 2x2.5 cm.  CT chest without contrast September 2025: Without hilar adenopathy.      08/19/2024    1:00 PM  Results of the Epworth flowsheet  Sitting and reading 2  Watching TV 3  Sitting, inactive in a public place (  e.g. a theatre or a meeting) 1  As a passenger in a car for an hour without a break 3  Lying down to rest in the afternoon when circumstances permit 3  Sitting and talking to someone 1  Sitting quietly after a lunch without alcohol  1  In a car, while stopped for a few minutes in traffic 1  Total score 15    Allergies: Tape, Topiramate , Cyclobenzaprine, Flexeril [cyclobenzaprine hcl], Meloxicam, Naltrexone,  Porcine (pork) protein, Sumatriptan, Wound dressing adhesive, Brexpiprazole, Glycopyrrolate , Imitrex [sumatriptan base], Lamotrigine , and Sulfa antibiotics  Current Outpatient Medications:    acetaZOLAMIDE  (DIAMOX ) 125 MG tablet, Take 1 tablet (125 mg total) by mouth at bedtime., Disp: 30 tablet, Rfl: 0   buPROPion  (WELLBUTRIN  XL) 150 MG 24 hr tablet, Take 1 tablet (150 mg total) by mouth every morning., Disp: 90 tablet, Rfl: 3   clonazePAM  (KLONOPIN ) 0.5 MG tablet, Take 1 tablet (0.5 mg total) by mouth 2 (two) times daily., Disp: 60 tablet, Rfl: 2   diazepam  (VALIUM ) 5 MG tablet, Place 1 tablet vaginally nightly as needed for muscle spasm/ pelvic pain., Disp: 60 tablet, Rfl: 1   DULoxetine  (CYMBALTA ) 60 MG capsule, Take 1 capsule (60 mg total) by mouth 2 (two) times daily., Disp: 180 capsule, Rfl: 3   Erenumab -aooe (AIMOVIG ) 70 MG/ML SOAJ, INJECT 70 MG INTO THE SKIN EVERY 28 DAYS., Disp: 1 mL, Rfl: 11   estradiol  (ESTRACE ) 0.1 MG/GM vaginal cream, Place 0.5 g vaginally 2 (two) times a week. Place 0.5g nightly for two weeks then twice a week after, Disp: 42.5 g, Rfl: 11   estradiol  (ESTRACE ) 0.5 MG tablet, Take 0.5 mg by mouth daily., Disp: , Rfl:    Fe Fum-Fe Poly-Vit C-Lactobac (FUSION) 65-65-25-30 MG CAPS, Take by mouth., Disp: , Rfl:    fluticasone (FLONASE SENSIMIST) 27.5 MCG/SPRAY nasal spray, 2 sprays (1 spray in each nostril) Nasally Once a day during seasons of difficulty for 30 days, Disp: , Rfl:    ibuprofen (ADVIL) 800 MG tablet, Take 800 mg by mouth 3 (three) times daily., Disp: , Rfl:    ipratropium (ATROVENT) 0.06 % nasal spray, Place 1 spray into both nostrils daily., Disp: , Rfl:    lactulose  (CHRONULAC ) 10 GM/15ML solution, TAKE 15 TO 30 ML AS DIRECTED UP TO 2 TIMES DAILY FOR CONSTIPATION, Disp: 5400 mL, Rfl: 0   levothyroxine  (SYNTHROID , LEVOTHROID) 88 MCG tablet, Take 88 mcg by mouth daily before breakfast., Disp: , Rfl:    linaclotide  (LINZESS ) 290 MCG CAPS capsule, Take 1  capsule (290 mcg total) by mouth daily before breakfast., Disp: 90 capsule, Rfl: 0   liothyronine (CYTOMEL) 5 MCG tablet, Take 5 mcg by mouth daily., Disp: , Rfl:    Magnesium Citrate (MAGNESIUM GUMMIES PO), Take 125 mg by mouth 2 (two) times daily., Disp: , Rfl:    metFORMIN  (GLUCOPHAGE ) 500 MG tablet, 1 with B and L, 2 at D (4 tabs daily), Disp: 120 tablet, Rfl: 0   methylphenidate  (RITALIN ) 10 MG tablet, Take 10 mg by mouth 2 (two) times daily. (Patient taking differently: Take 20 mg by mouth 2 (two) times daily. Taking 20), Disp: , Rfl:    mirabegron  ER (MYRBETRIQ ) 25 MG TB24 tablet, Take 1 tablet (25 mg total) by mouth daily., Disp: 30 tablet, Rfl: 5   Multiple Vitamins-Minerals (MULTIVITAMIN GUMMIES ADULT PO), Take 1 each by mouth daily., Disp: , Rfl:    nystatin cream (MYCOSTATIN), Apply 1 application  topically daily as needed for dry skin., Disp: ,  Rfl:    Olopatadine HCl 0.2 % SOLN, 1 drop into affected eye Ophthalmic Once a day or prn for 30 days, Disp: , Rfl:    omeprazole  (PRILOSEC) 20 MG capsule, Take 20 mg by mouth 2 (two) times daily before a meal., Disp: , Rfl:    ondansetron  (ZOFRAN -ODT) 4 MG disintegrating tablet, Take 1 tablet (4 mg total) by mouth every 8 (eight) hours as needed for nausea or vomiting., Disp: 20 tablet, Rfl: 11   Oxycodone  HCl 10 MG TABS, Take 10 mg by mouth every 6 (six) hours as needed., Disp: , Rfl:    polyethylene glycol-electrolytes (NULYTELY) 420 g solution, do 6 oz every 30 mins until the impaction passes, can do 1/2 gallon on first day and 1/2 gallon on 2nd day.  Can continue 2 fleets enemas a day until it resolves., Disp: 4000 mL, Rfl: 0   potassium chloride  (K-DUR,KLOR-CON ) 10 MEQ tablet, Take 1 tablet by mouth daily., Disp: , Rfl:    progesterone  (PROMETRIUM ) 100 MG capsule, Take 100 mg by mouth daily., Disp: , Rfl:    promethazine  (PHENERGAN ) 12.5 MG tablet, Take 1 tablet (12.5 mg total) by mouth every 8 (eight) hours as needed for nausea or vomiting.,  Disp: 30 tablet, Rfl: 0   Propylene Glycol (SYSTANE COMPLETE) 0.6 % SOLN, Apply 2 drops to eye., Disp: , Rfl:    rOPINIRole  (REQUIP ) 2 MG tablet, Take 2 mg by mouth 2 (two) times daily., Disp: , Rfl:    torsemide (DEMADEX) 20 MG tablet, Take 20 mg by mouth 2 (two) times daily., Disp: , Rfl:    traZODone  (DESYREL ) 100 MG tablet, Take 2 tablets (200 mg total) by mouth at bedtime., Disp: 180 tablet, Rfl: 3   Ubrogepant  (UBRELVY ) 100 MG TABS, Take 1 tablet (100 mg total) by mouth daily as needed (at onset of headaches may repeat dose after 2 hours as needed max 200 mg in 24 hours)., Disp: 16 tablet, Rfl: 11   Vitamin D -Vitamin K (VITAMIN K2-VITAMIN D3 PO), Take 1 each by mouth daily. Vitamin K2 100 mcg/Vitamin D  50 mcg per gummie, Disp: , Rfl:    cariprazine  (VRAYLAR ) 3 MG capsule, Take 1 capsule (3 mg total) by mouth daily., Disp: 90 capsule, Rfl: 3   lubiprostone  (AMITIZA ) 24 MCG capsule, Take 1 capsule (24 mcg total) by mouth 2 (two) times daily with a meal. (Patient not taking: Reported on 11/23/2024), Disp: 60 capsule, Rfl: 0   tiZANidine (ZANAFLEX) 4 MG tablet, Take 4 mg by mouth 2 (two) times daily. (Patient not taking: Reported on 11/23/2024), Disp: , Rfl:  Past Medical History:  Diagnosis Date   Allergic rhinitis    Allergy    Anemia    Anxiety disorder    Back pain    Bilateral swelling of feet    Bronchitis    Cancer (HCC)    skin   Chronic fatigue    Chronic fatigue    Chronic headaches    Chronic pain    Constipation    Depression    Endometriosis    Family history of colon cancer    mother,uncles,aunts   Fibromyalgia    Fibromyalgia    Gallbladder problem    GERD (gastroesophageal reflux disease)    Hyperlipidemia    Hypothyroidism    IBS (irritable bowel syndrome)    Insomnia    Iron  deficiency    Joint pain    Laryngopharyngeal reflux (LPR)    Obesity    Osteoarthritis  Personal history of colonic polyps 04/2006   polypoid   PONV (postoperative nausea and  vomiting)    RLS (restless legs syndrome)    Sleep apnea    no cpap   SOB (shortness of breath)    Swelling    Vitamin B12 deficiency    Vitamin D  deficiency    Past Surgical History:  Procedure Laterality Date   ANTERIOR CRUCIATE LIGAMENT REPAIR Left 2003   APPENDECTOMY  1993   BACK SURGERY  03/2024   spinal cord stimulator placed   CARPAL TUNNEL RELEASE Left 09/25/2017   Procedure: LEFT CARPAL TUNNEL RELEASE;  Surgeon: Murrell Kuba, MD;  Location: [facility];  Service: Orthopedics;  Laterality: Left;   CARPAL TUNNEL RELEASE Right    CHOLECYSTECTOMY  1999   COLONOSCOPY  2009   [facility]   DE QUERVAIN'S RELEASE     LUMBAR LAMINECTOMY     L4- S1   PANNICULECTOMY     POLYPECTOMY     REPAIR ANKLE LIGAMENT Left 2001   ROUX-EN-Y GASTRIC BYPASS  04/2004   Dr Donnice Lunger   TONSILLECTOMY AND ADENOIDECTOMY  1976   TRIGGER FINGER RELEASE     Family History  Problem Relation Age of Onset   Obesity Mother    Depression Mother    Cancer Mother    Heart disease Mother    Hyperlipidemia Mother    Hypertension Mother    Colon cancer Mother        uncles,aunts   Sleep apnea Father    Cancer Father    Diabetes Father        paternal grandmother,sister   Leukemia Father    Lymphoma Father    Skin cancer Father    Heart disease Father    Colon polyps Sister    Diabetes Sister    Colon cancer Maternal Aunt    Colon cancer Maternal Uncle    Colon cancer Cousin    Colon polyps Cousin    Stomach cancer Neg Hx    Esophageal cancer Neg Hx    Rectal cancer Neg Hx    Social History   Socioeconomic History   Marital status: Significant Other    Spouse name: Leonce Ee   Number of children: 0   Years of education: Not on file   Highest education level: Not on file  Occupational History   Occupation: RN/disabled  Tobacco Use   Smoking status: Never   Smokeless tobacco: Never  Vaping Use   Vaping status: Never Used  Substance and Sexual Activity    Alcohol  use: Not Currently    Alcohol /week: 0.0 standard drinks of alcohol     Comment: rarely   Drug use: No   Sexual activity: Yes    Birth control/protection: Other-see comments  Other Topics Concern   Not on file  Social History Narrative   Patient is left-handed. She lives alone in a one level home. She has 3 cats. She is unable to exercise. Caffeine one soda / day. On disability now. 2 bachelor degrees   Social Drivers of Health   Tobacco Use: Low Risk (11/23/2024)   Patient History    Smoking Tobacco Use: Never    Smokeless Tobacco Use: Never    Passive Exposure: Not on file  Financial Resource Strain: Low Risk (04/06/2024)   Received from [facility]   Overall Financial Resource Strain (CARDIA)    Difficulty of Paying Living Expenses: Not hard at all  Food Insecurity: Low Risk (08/05/2024)  Received from [facility]   Epic    Within the past 12 months, you worried that your food would run out before you got money to buy more: Never true    Within the past 12 months, the food you bought just didn't last and you didn't have money to get more. : Never true  Transportation Needs: No Transportation Needs (08/05/2024)   Received from [facility]    In the past 12 months, has lack of reliable transportation kept you from medical appointments, meetings, work or from getting things needed for daily living? : No  Physical Activity: Not on file  Stress: Not on file  Social Connections: Not on file  Intimate Partner Violence: Not on file  Depression (PHQ2-9): High Risk (05/25/2024)   Depression (PHQ2-9)    PHQ-2 Score: 11  Alcohol  Screen: Not on file  Housing: Low Risk (08/05/2024)   Received from [facility]   Epic    What is your living situation today?: I have a steady place to live    Think about the place you live. Do you have problems with any of the following? Choose all that apply:: None/None on this list  Utilities: Low Risk (08/05/2024)   Received from  [facility]   Utilities    In the past 12 months has the electric, gas, oil, or water company threatened to shut off services in your home? : No  Health Literacy: Not on file       Objective:  BP 110/80   Pulse 69   Temp 97.8 F (36.6 C) (Oral)   Ht 5' 7 (1.702 m)   Wt 223 lb (101.2 kg)   SpO2 94%   BMI 34.93 kg/m  BMI Readings from Last 3 Encounters:  11/23/24 34.93 kg/m  11/04/24 35.16 kg/m  11/02/24 33.52 kg/m    Physical Exam: Physical Exam   ENT: Normal mucosa.  PULMONARY: Lungs clear to auscultation bilaterally, no adventitious breath sounds. CARDIOVASCULAR: Regular rate and rhythm, S1 S2 normal, no murmurs. ABDOMEN: Abdomen soft, nontender. Bowel sounds are normal. EXTREMITIES: No peripheral edema noted.       Diagnostic Review:  Last metabolic panel Lab Results  Component Value Date   GLUCOSE 95 05/25/2024   NA 141 05/25/2024   K 4.2 05/25/2024   CL 99 05/25/2024   CO2 27 05/25/2024   BUN 16 05/25/2024   CREATININE 0.86 05/25/2024   EGFR 78 05/25/2024   CALCIUM 9.2 05/25/2024   PROT 6.3 05/25/2024   ALBUMIN 4.2 05/25/2024   LABGLOB 2.1 05/25/2024   BILITOT 0.3 05/25/2024   ALKPHOS 103 05/25/2024   AST 32 05/25/2024   ALT 38 (H) 05/25/2024         Assessment & Plan:   Assessment & Plan OSA (obstructive sleep apnea)  Drowsiness  Obesity, Class I, BMI 30-34.9   Orders Placed This Encounter  Procedures   Basic Metabolic Panel (BMET)   Bipap titration    Assessment and Plan    Obstructive and central sleep apnea Inadequate control on current BiPAP settings with persistent daytime sleepiness and central events complicating management. Snoring and dry mouth noted. Discussed potential risk of kidney stones with Diamox . - Ordered titration study at sleep lab to adjust BiPAP settings. Will try ASV titration.  - starting Diamox  for central sleep apnea, monitor for side effects. Discussed that this is off label use. - Ordered BMP in 30  days to monitor electrolytes.  Excessive daytime sleepiness Persistent excessive  daytime sleepiness. Sleepiness may be related to inadequate control of sleep apnea. - Address sleep apnea management to potentially improve daytime sleepiness.      She was counselled about not driving while drowsy which is common side effect of sleep related disorders.   Return for 2 months after sleep study.   I personally spent a total of 30 minutes in the care of the patient today including preparing to see the patient, getting/reviewing separately obtained history, performing a medically appropriate exam/evaluation, counseling and educating, and placing orders.   Sammi Fredericks, MD  "

## 2024-11-28 ENCOUNTER — Other Ambulatory Visit: Payer: Self-pay | Admitting: Physician Assistant

## 2024-11-29 ENCOUNTER — Telehealth: Payer: Self-pay

## 2024-11-29 NOTE — Telephone Encounter (Signed)
 Received advanced refill request from [facility] for pt's Acetazolamide  prescription. Placed in Dr. Lavena sign folder. Once signed, will fax back to [facility] at [phone]

## 2024-12-07 ENCOUNTER — Ambulatory Visit: Payer: Self-pay | Attending: Obstetrics and Gynecology | Admitting: Physical Therapy

## 2024-12-07 ENCOUNTER — Encounter: Payer: Self-pay | Admitting: Physical Therapy

## 2024-12-07 ENCOUNTER — Encounter (INDEPENDENT_AMBULATORY_CARE_PROVIDER_SITE_OTHER): Payer: Self-pay | Admitting: Family Medicine

## 2024-12-07 ENCOUNTER — Ambulatory Visit (INDEPENDENT_AMBULATORY_CARE_PROVIDER_SITE_OTHER): Admitting: Family Medicine

## 2024-12-07 DIAGNOSIS — R278 Other lack of coordination: Secondary | ICD-10-CM | POA: Insufficient documentation

## 2024-12-07 DIAGNOSIS — F5089 Other specified eating disorder: Secondary | ICD-10-CM | POA: Diagnosis not present

## 2024-12-07 DIAGNOSIS — Z6833 Body mass index (BMI) 33.0-33.9, adult: Secondary | ICD-10-CM

## 2024-12-07 DIAGNOSIS — R102 Pelvic and perineal pain unspecified side: Secondary | ICD-10-CM | POA: Insufficient documentation

## 2024-12-07 DIAGNOSIS — E559 Vitamin D deficiency, unspecified: Secondary | ICD-10-CM | POA: Diagnosis not present

## 2024-12-07 DIAGNOSIS — G4733 Obstructive sleep apnea (adult) (pediatric): Secondary | ICD-10-CM

## 2024-12-07 DIAGNOSIS — M6281 Muscle weakness (generalized): Secondary | ICD-10-CM | POA: Insufficient documentation

## 2024-12-07 DIAGNOSIS — L905 Scar conditions and fibrosis of skin: Secondary | ICD-10-CM | POA: Diagnosis present

## 2024-12-07 DIAGNOSIS — E88819 Insulin resistance, unspecified: Secondary | ICD-10-CM | POA: Diagnosis not present

## 2024-12-07 DIAGNOSIS — F39 Unspecified mood [affective] disorder: Secondary | ICD-10-CM | POA: Diagnosis not present

## 2024-12-07 DIAGNOSIS — Z6838 Body mass index (BMI) 38.0-38.9, adult: Secondary | ICD-10-CM

## 2024-12-07 MED ORDER — METFORMIN HCL 500 MG PO TABS: ORAL_TABLET | ORAL | 0 refills | Status: AC

## 2024-12-07 NOTE — Therapy (Signed)
 " OUTPATIENT PHYSICAL THERAPY FEMALE PELVIC TREATMENT   Patient Name: Paige [name] MRN: [id number] DOB:October 23, 1966, 59 y.o., female Today's Date: 12/07/2024 Progress Note Reporting Period 08/26/24 to 12/07/24  See note below for Objective Data and Assessment of Progress/Goals.     END OF SESSION:  PT End of Session - 12/07/24 1504     Visit Number 10    Date for Recertification  02/10/25    Authorization Type [employer] medicare    Authorization - Visit Number 10    Authorization - Number of Visits 20    PT Start Time 1500    PT Stop Time 1550    PT Time Calculation (min) 50 min    Activity Tolerance Patient tolerated treatment well    Behavior During Therapy WFL for tasks assessed/performed          Past Medical History:  Diagnosis Date   Allergic rhinitis    Allergy    Anemia    Anxiety disorder    Back pain    Bilateral swelling of feet    Bronchitis    Cancer (HCC)    skin   Chronic fatigue    Chronic fatigue    Chronic headaches    Chronic pain    Constipation    Depression    Endometriosis    Family history of colon cancer    mother,uncles,aunts   Fibromyalgia    Fibromyalgia    Gallbladder problem    GERD (gastroesophageal reflux disease)    Hyperlipidemia    Hypothyroidism    IBS (irritable bowel syndrome)    Insomnia    Iron  deficiency    Joint pain    Laryngopharyngeal reflux (LPR)    Obesity    Osteoarthritis    Personal history of colonic polyps 04/2006   polypoid   PONV (postoperative nausea and vomiting)    RLS (restless legs syndrome)    Sleep apnea    no cpap   SOB (shortness of breath)    Swelling    Vitamin B12 deficiency    Vitamin D  deficiency    Past Surgical History:  Procedure Laterality Date   ANTERIOR CRUCIATE LIGAMENT REPAIR Left 2003   APPENDECTOMY  1993   BACK SURGERY  03/2024   spinal cord stimulator placed   CARPAL TUNNEL RELEASE Left 09/25/2017   Procedure: LEFT CARPAL TUNNEL RELEASE;  Surgeon: Murrell Kuba, MD;   Location: [facility];  Service: Orthopedics;  Laterality: Left;   CARPAL TUNNEL RELEASE Right    CHOLECYSTECTOMY  1999   COLONOSCOPY  2009   [facility]   DE QUERVAIN'S RELEASE     LUMBAR LAMINECTOMY     L4- S1   PANNICULECTOMY     POLYPECTOMY     REPAIR ANKLE LIGAMENT Left 2001   ROUX-EN-Y GASTRIC BYPASS  04/2004   Dr Donnice Lunger   TONSILLECTOMY AND ADENOIDECTOMY  1976   TRIGGER FINGER RELEASE     Patient Active Problem List   Diagnosis Date Noted   High-tone pelvic floor dysfunction 05/18/2024   SUI (stress urinary incontinence, female) 05/18/2024   Overactive bladder 05/18/2024   Incontinence of feces 05/18/2024   Vaginal atrophy 05/18/2024   Chronic idiopathic constipation 05/18/2024   Nutritional deficiency 03/17/2024   OSA on CPAP 03/17/2024   Encounter for orthopedic follow-up care 01/31/2020   Pain of left hand 12/19/2018   Radial styloid tenosynovitis 12/19/2018   Trigger finger 12/19/2018   Abscess of postoperative wound of abdominal wall 10/05/2018  Pannus, abdominal 09/21/2018   Pigmented skin lesion 06/23/2018   Cervicalgia 02/12/2018   Bilateral carpal tunnel syndrome 08/13/2017   Primary osteoarthritis of both first carpometacarpal joints 08/13/2017   Muscle tension dysphonia 08/12/2016   Vocal cord atrophy 08/12/2016   Hoarseness 07/02/2016   Laryngopharyngeal reflux (LPR) 07/02/2016   Cough 05/23/2016   Dyspnea 05/23/2016   Chronic pain 05/22/2016   Sacrococcygeal disorders, not elsewhere classified 11/24/2015   Anemia, iron  deficiency 08/24/2015   Fibrositis 08/17/2015   Abnormal weight gain 08/17/2015   Anxiety 08/17/2015   Recurrent major depressive episodes 08/17/2015   History of migraine headaches 08/17/2015   Neurosis, posttraumatic 08/17/2015   Bariatric surgery status 08/17/2015   DDD (degenerative disc disease), lumbar 01/18/2015   Facet joint syndrome 01/18/2015   Thoracic spondylosis 01/18/2015   Myofascial pain  02/22/2013   Back ache 07/09/2012   Family history of malignant neoplasm of gastrointestinal tract 06/10/2011   History of colonic polyps 06/10/2011   Esophageal reflux 06/10/2011   Cellulitis 06/10/2011   History of Roux-en-Y gastric bypass 06/10/2011   B12 nutritional deficiency 06/10/2011   Morbid obesity due to excess calories (HCC) 06/10/2011   Personal history of fibromyalgia 06/10/2011   Recurrent major depressive disorder, in partial remission 06/10/2011   Acid reflux 06/10/2011    PCP: Viona Fitch, MD  REFERRING PROVIDER: Marilynne Rosaline SAILOR, MD   REFERRING DIAG:  M62.89 (ICD-10-CM) - High-tone pelvic floor dysfunction  N39.3 (ICD-10-CM) - SUI (stress urinary incontinence, female)  N32.81 (ICD-10-CM) - Overactive bladder  R15.9 (ICD-10-CM) - Incontinence of feces, unspecified fecal incontinence type    THERAPY DIAG:  Muscle weakness (generalized) - Plan: PT plan of care cert/re-cert  Other lack of coordination - Plan: PT plan of care cert/re-cert  Scar - Plan: PT plan of care cert/re-cert  Pelvic pain - Plan: PT plan of care cert/re-cert  Rationale for Evaluation and Treatment: Rehabilitation  ONSET DATE: 12/2023  SUBJECTIVE:                                                                                                                                                                                           SUBJECTIVE STATEMENT: I used the vaginal valium  and was able to have penile penetration vaginally with less pain. Pain level was 7/10. Patient does to have to stop in the middle. No urinary leakage. I either have stool leakage that is 50% better. Still have times to not have bowel movements. Has had several normal bowel movements. Patient does not have to run to the water to urinate instead does the breathing.   Fluid intake:  100oz water, 1 small  bottle diet dr pepper per day. Has an occasional coffee. Usually only has sips of water before bed.    FUNCTIONAL LIMITATIONS: Patient uses a stick for walking  PERTINENT HISTORY:  Surgeries: Cholecystectomy; appendectomy; back surgery with spinal cored stimulator; Roux-EN-Y gastric bypass Other: Skin Caner; Fibromyalgia; IBS; Hypothyroidism;    PAIN:  Are you having pain? Yes NPRS scale: 5/10 10/19/24: pain level is 6/10 12/07/24: pain level 3/10 Pain location: lower abdominal area  Pain type: aching Pain description: constant   Aggravating factors: needing to have a bowel movement and not able to Relieving factors: heat at one tim  PRECAUTIONS: Other: skin cancer; spinal cord stimulator  RED FLAGS: None   WEIGHT BEARING RESTRICTIONS: No  FALLS:  Has patient fallen in last 6 months? No  OCCUPATION: disability  ACTIVITY LEVEL : Patient has stopped exercising since 9/1 due to back pain  PLOF: Independent  PATIENT GOALS: be able to urinate better, not have to use an enema to have a bowel movement, reduce pelvic pain   BOWEL MOVEMENT: Pain with bowel movement: Yes, 8-9/10 Type of bowel movement:0-4 time(s) per week ; strains; Type 1, hard rocks Fully empty rectum: No Leakage: Consistency with leakage: soft                                                      Caused by: small amounts weekly, usually after an incomplete BM  Pads: Yes: depends at night Fiber supplement/laxative stool softener (dulcolax 5mg  BID), Ibrelsa BID   URINATION: Pain with urination: Yes, burning of retention Fully empty bladder: Nonot able to urinate right away, sometimes needs to run water; she will wipe then urinate again                          Post-void dribble: No Stream: Weak Urgency: No Frequency:during the day  8+                                                           Nocturia: Yes:  2-6 times per night to void    10/05/24: wakes up 2 times Leakage: Coughing, Sneezing, Laughing, and without sensation Pads/briefs:  0-2 pads per day ; depends  at night  INTERCOURSE:  Ability to  have vaginal penetration Yes  Pain with intercourse: Initial Penetration and Deep Penetration Dryness: Yes    PROLAPSE: None   OBJECTIVE:  Note: Objective measures were completed at Evaluation unless otherwise noted.  DIAGNOSTIC FINDINGS:  Pelvic floor strength I/V, puborectalis I/V external anal sphincter I/V   Pelvic floor musculature: Right levator tender, Right obturator tender, Left levator tender, Left obturator tender. High tone throughout Post Void Residual 10 mL      COGNITION: Overall cognitive status: Within functional limits for tasks assessed     SENSATION: Light touch: Appears intact   GAIT: Assistive device utilized: walking stick  POSTURE: rounded shoulders, forward head, and decreased lumbar lordosis; Right shoulder higher; patient had scoliosos   LUMBARAROM/PROM:not tested due to fusion    LOWER EXTREMITY MNF:qloo bilateral hip ROM   LOWER EXTREMITY MMT:  MMT Right eval Left eval  Right/Left 12/07/24  Hip flexion 4/5 4/5 4/5  Hip extension 4/5 4/5 4/5  Hip abduction 3/5 3/5 3/5   (Blank rows = not tested) PALPATION:  Abdominal: tenderness of the lower abdomen; increased lower rib cage angle  Diastasis: No Distortion: No  Breathing: contract the abdominals and will raise the lower rib cage Scar tissue: Yes: along the lower abdomen, lower umbilicus,                 External Perineal Exam: redness along the posterior introitus, tenderness located on the perineal body and around the rectum                             Internal Pelvic Floor: therapist was not able to get the index finger past the DIP joint into the vaginal canal due to pain; going through the rectum the puborectalis did not move forward well, tenderness throughout the levator ani and obturator internist and in the rectum.   Patient confirms identification and approves PT to assess internal pelvic floor and treatment Yes No emotional/communication barriers or cognitive limitation.  Patient is motivated to learn. Patient understands and agrees with treatment goals and plan. PT explains patient will be examined in standing, sitting, and lying down to see how their muscles and joints work. When they are ready, they will be asked to remove their underwear so PT can examine their perineum. The patient is also given the option of providing their own chaperone as one is not provided in our facility. The patient also has the right and is explained the right to defer or refuse any part of the evaluation or treatment including the internal exam. With the patient's consent, PT will use one gloved finger to gently assess the muscles of the pelvic floor, seeing how well it contracts and relaxes and if there is muscle symmetry. After, the patient will get dressed and PT and patient will discuss exam findings and plan of care. PT and patient discuss plan of care, schedule, attendance policy and HEP activities.   PELVIC MMT:   MMT eval 09/16/24 10/05/24  Vaginal 2/5 4/5   Internal Anal Sphincter 2/5  2/5  External Anal Sphincter 2/5  2/5  Puborectalis 2/5  2/5  (Blank rows = not tested)        TONE: Increased tone of the pelvic floor  PROLAPSE: none  TODAY'S TREATMENT:  12/07/24 Neuromuscular re-education: Core facilitation: Transverse abdominus contraction in supine with therapist giving verbal and tactile cues to educated on the feeling of the contraction 20 x  Supine marching with abdominal contraction 20 x  Supine heel slides with core engagement 10 x each Supine knee fallouts with core engagement 10 x each Therapeutic activities: Functional strengthening activities: Discussed with patient on how the diaphragmatic breathing helps with urination and bowel movements.  Self-care: Discussed with patient on her high co-pay and if she would like to continue, come monthly or be discharged. She will advise therapist by the next visit Discussed with patient on good bowel health that  includes healthy eating, eating at same time of day and same amount for each meal.  Educated patient on the model on how to mobilize the clitoris, ischiocavernosus, perineal body externally and internally, mobilize the pelvic floor muscles internally using lubricant. Gave patient samples of Gisele searles    11/16/24 Manual: Soft tissue mobilization: Manual work to the pelvic floor externally going through her clothes due to patient leaking  stool so not comfortable with therapist going internal Scar tissue mobilization: Manual work to the lower abdominal scar to improve mobility Myofascial release: Fascial release to the mesenteric root, lower abdomen. Along the right quadrant, and around the upper abdomen to reduce the restrictions and help with the stool to move through.  Self-care: Discussed with patient if she is using the valium  vaginally, bowel prep and enemas. Discussed when she will do them and how they may help her with her issues.     11/02/24 Manual: Soft tissue mobilization: ILOVEU massage to the abdomen to assist with stool moving through the intestines and heard bowel noises.  Scar tissue mobilization: Manual work to the lower abdominal scar to improve mobility Myofascial release: Fascial release to the mesenteric root, lower abdomen. Along the right quadrant, and around the upper abdomen to reduce the restrictions and help with the stool to move throug.  Neuromuscular re-education: Down training: Sitting on the ball and performing diaphragmatic breathing to relax the pelvic floor then generate force to bear down Exercises: Stretches/mobility: Figure 4 stretch in supine holding 30 sec each Single knee to chest holding 30 sec each Supine rock the knees back and forth to increase trunk rotation Strengthening: Transverse abdominus contraction with pressing hands into the mat.    10/19/24 Manual: Soft tissue mobilization:  Manual work to the diaphragm I Love U abdominal  massage to promote peristalic motion of the intestines to assist with movement of stool Scar tissue mobilization: Manual work to the lower abdominal scar going through the different layers of restrictions Tissue rolling along the scar Using a suction cup to pull up on the tissue to release the restrictions Myofascial release: Fascial release along the mons pubs and lower abdomen to reduce the restrictions       PATIENT EDUCATION:  09/23/24 Education details: Access Code: [email or web address], scar massage Person educated: Patient Education method: Explanation, Demonstration, Tactile cues, Verbal cues, and Handouts Education comprehension: verbalized understanding, returned demonstration, verbal cues required, tactile cues required, and needs further education  HOME EXERCISE PROGRAM: 09/23/24 Access Code: [email or web address] URL: [email or web address] Date: 09/23/2024 Prepared by: Channing Pereyra  Exercises - Supine Diaphragmatic Breathing  - 2 x daily - 7 x weekly - 1 sets - 10 reps - Seated Diaphragmatic Breathing  - 2 x daily - 7 x weekly - 1 sets - 10 reps - Shoulder Extension with Resistance - 90/90 on Swiss Ball  - 1 x daily - 7 x weekly - 1 sets - 10 reps - Hooklying Anti-Rotation Press With Anchored Resistance  - 1 x daily - 7 x weekly - 1 sets - 10 reps - Hooklying Transversus Abdominis Palpation  - 1 x daily - 7 x weekly - 1 sets - 10 reps - Standing Shoulder Extension with Resistance  - 1 x daily - 3 x weekly - 1 sets - 10 reps - Standing Diagonal Shoulder Extension with Anchored Resistance  - 1 x daily - 3 x weekly - 1 sets - 10 reps - Standing Shoulder Flexion with Posterior Anchored Resistance  - 1 x daily - 3 x weekly - 1 sets - 10 reps  Patient Education - Abdominal Massage for Constipation - Abdominal Massage for Constipation   ASSESSMENT:  CLINICAL IMPRESSION: Patient is a 59 y.o. female who was seen today for physical therapy  treatment for high toned pelvic floor,  urinary and fecal incontinence. Patient is not leaking urine. She does not have  trouble starting a urine stream.   Patient  is using the squatty potty and breathing to urinate. Lower abdominal scar pain is 70% better. Patient has to strain to have a bowel movement. She will leak stool when she is taking the laxative that is prescribed and during night but not as often. Her stool leakage is 50% better.  Patient will benefit from skilled therapy to improve pelvic floor muscle excursion, reduce pain and coordination to help with continence and toileting.   OBJECTIVE IMPAIRMENTS: decreased activity tolerance, decreased coordination, decreased strength, increased fascial restrictions, increased muscle spasms, and pain.   ACTIVITY LIMITATIONS: sleeping, continence, and toileting  PARTICIPATION LIMITATIONS: meal prep, cleaning, laundry, interpersonal relationship, shopping, and community activity  PERSONAL FACTORS: Cholecystectomy; appendectomy; back surgery with spinal cored stimulator; Roux-EN-Y gastric bypass are also affecting patient's functional outcome.   REHAB POTENTIAL: Excellent  CLINICAL DECISION MAKING: Unstable/unpredictable  EVALUATION COMPLEXITY: High   GOALS: Goals reviewed with patient? Yes  SHORT TERM GOALS: Target date: 09/23/24  Patient educated on abdominal massage to assist with peristalic motion of the intestines.  Baseline: Goal status: Met 09/02/24  2.  Patient educated on scar mobilization to improve mobility and reduce pain.  Baseline:  Goal status: Met 09/02/24  3.  Patient is able to perform diaphragmatic breathing to lengthen the pelvic floor to urinate and have a bowel movement.  Baseline:  Goal status: Met 09/09/24  4.  Patient educated on correct ways to have a bowel movement to reduce straining.  Baseline:  Goal status: Met 09/16/24  LONG TERM GOALS: Target date: 11/18/24; new target date 02/10/25  Patient independent with advanced HEP for core and  pelvic floor strength to improve continence.  Baseline:  Goal status: ongoing 12/07/24  2.  Patient is able to sit on commode and urinate within 15 seconds due to the ability to relax her pelvic floor.  Baseline: most of the time Goal status: ongoing 12/07/24  3.  Patient is able to sit on the commode to have a bowel movement and not strain 50% of the time due to the ability to relax the pelvic floor.  Baseline: sometimes Goal status: ongoing 12/07/24  4.  Patient has increased in lower abdominal scar to reduce her lower abdominal pain </= 2/10.  Baseline:  Goal status: Met 11/16/24  5.  Patient reports her urinary leakage decreased </= 70% due to the ability to fully relax the pelvic floor and contract and reduction of trigger points.  Baseline: urinary leakage decreased by 80% Goal status: Met 11/02/24   PLAN:  PT FREQUENCY: 1-2x/week  PT DURATION: 12 weeks  PLANNED INTERVENTIONS: 97110-Therapeutic exercises, 97530- Therapeutic activity, 97112- Neuromuscular re-education, 97535- Self Care, [id number]- Manual therapy, 20560 (1-2 muscles), 20561 (3+ muscles)- Dry Needling, Patient/Family education, Cryotherapy, Moist heat, and Biofeedback  PLAN FOR NEXT SESSION:  Patient has a high co-pay. She will decide if she is able to return to therapy or come monthly.   Channing Pereyra, PT 12/07/2024 4:58 PM  "

## 2024-12-07 NOTE — Progress Notes (Signed)
 "  Paige DOROTHA [name], D.O.  ABFM, ABOM Specializing in Clinical Bariatric Medicine  Office located at: [address] [place], [place]  [address]    Review labs at next OV  A) FOR THE CHRONIC DISEASE OF OBESITY:  Chief complaint: Obesity Alveria is here to discuss her progress with her obesity treatment plan.   History of present illness / Interval history:  Paige [name] is here today for her follow-up office visit.  Since last OV on 11/02/24, pt is down 4 lbs. Patient states that she had a party and she had left over. She had left overs and did not go grocery shopping for a while and ate cookies and left over for awhile.     11/02/24 14:00 12/07/24 10:00   Body Fat % 47.7 % 47.1 %  Muscle Mass (lbs) 107.6 lbs 106.2 lbs  Fat Mass (lbs) 101.4 lbs 99.4 lbs  Total Body Water (lbs) 83.4 lbs 85 lbs  Visceral Fat Rating  13 13   Counseling done on how various foods will affect these numbers and how to maximize success.  Total lbs lost to date: - 48 lbs Total Fat Mass in lbs lost to date: - 23.8 lbs Total weight loss percentage to date: - 18.53 %    Morbid obesity (HCC) Starting BMI 40.57 BMI 38.0-38.9,adult current 33.04  Nutrition Therapy She is  journaling 1000-1100 cal and 80+ grams protein and states she is following her eating plan approximately 90 % of the time.   - Tracking Calories/Macros: yes  - Eating More Whole Foods: yes  - Adequate Protein Intake: yes  - Adequate Water Intake: yes  - Skipping Meals: no   - Sleeping 7-9 Hours/ Night: no   Paige [name] is currently in the action stage of change. As such, her goal is to continue weight management plan.  She has agreed to: continue current plan   Physical Activity Paige [name] is doing yoga or tai chi 60  minutes 3 days per week   Paige [name] has been advised to work up to 300-450 minutes of moderate intensity aerobic activity a week and strengthening exercises 2-3 times per week for cardiovascular  health, weight loss maintenance and preservation of muscle mass.  She has agreed to : Increase volume of physical activity to a goal of 240 minutes a week and Combine aerobic and strengthening exercises for efficiency and improved cardiometabolic health.   Behavioral Modifications Evidence-based interventions for health behavior change were utilized today including the discussion of  1) self monitoring techniques:    - Continue journaling  2) problem-solving barriers:  ***  3) self care:  ***  4) SMART goals for next OV:    - Increase exercise as tolerated  Regarding patient's less desirable eating habits and patterns, we employed the technique of small changes.   We discussed the following today: continue to work on maintaining a reduced calorie state, getting the recommended amount of protein, incorporating whole foods, making healthy choices, staying well hydrated and practicing mindfulness when eating. Additional resources provided today: {[email or web address]}   Medical Interventions/ Pharmacotherapy Previous Bariatric surgery: n/a Pharmacotherapy for weight loss: She is currently taking Metformin  1 pill at breakfast and lunch and 2 pills at dinner for medical weight loss.    We discussed various medication options to help Paige [name] with her weight loss efforts and we both agreed to : Adequate clinical response to anti-obesity medication, continue current anti-obesity regimen   B) OBESITY RELATED CONDITIONS ADDRESSED TODAY:  OSA  on BiPAP Assessment & Plan Another sleep study to rule out sleep study  Chose to go to another sleep study dr  Sleeps better on her side  States that she takes trazadone to help her sleep     Insulin  resistance Assessment & Plan Lab Results  Component Value Date   HGBA1C 5.5 05/25/2024   INSULIN  5.4 05/25/2024    On Metformin  1 pill at breakfast and lunch and 2 pills at dinner. With reported good compliance and tolerance  Metformin  helped with  her cravings  Denies any side effects and loose stools    Mood disorder (HCC) - emotional eating Assessment & Plan On Wellbutrin  XL 150 mg. With reported good compliance and tolerance. No acute concerns today.     Vitamin D  deficiency Assessment & Plan Lab Results  Component Value Date   VD25OH 81.5 05/25/2024   Taking       There are no discontinued medications.   No orders of the defined types were placed in this encounter.     Follow up:   Return 01/03/2025 at 4:00 PM  She was informed of the importance of frequent follow up visits to maximize her success with intensive lifestyle modifications for her multiple health conditions.   Weight Summary and Biometrics   Weight Lost Since Last Visit: 3lb  Weight Gained Since Last Visit: 0lb   Vitals Temp: 97.6 F (36.4 C) BP: 112/75 Pulse Rate: 76 SpO2: 95 %   Anthropometric Measurements Height: 5' 7 (1.702 m) Weight: 211 lb (95.7 kg) BMI (Calculated): 33.04 Weight at Last Visit: 231lb Weight Lost Since Last Visit: 3lb Weight Gained Since Last Visit: 0lb Starting Weight: 259lb Total Weight Loss (lbs): 48 lb (21.8 kg) Peak Weight: 316lb   Body Composition  Body Fat %: 47.1 % Fat Mass (lbs): 99.4 lbs Muscle Mass (lbs): 106.2 lbs Total Body Water (lbs): 85 lbs Visceral Fat Rating : 13   Other Clinical Data Fasting: yes Labs: yes Today's Visit #: 9 Starting Date: 05/25/24    Objective:   PHYSICAL EXAM: Blood pressure 112/75, pulse 76, temperature 97.6 F (36.4 C), height 5' 7 (1.702 m), weight 211 lb (95.7 kg), SpO2 95%. Body mass index is 33.05 kg/m.  General: she is overweight, cooperative and in no acute distress. PSYCH: Has normal mood, affect and thought process.   HEENT: EOMI, sclerae are anicteric. Lungs: Normal breathing effort, no conversational dyspnea. Extremities: Moves * 4 Neurologic: A and O * 3, good insight  DIAGNOSTIC DATA REVIEWED: BMET    Component Value Date/Time    NA 141 05/25/2024 1214   K 4.2 05/25/2024 1214   CL 99 05/25/2024 1214   CO2 27 05/25/2024 1214   GLUCOSE 95 05/25/2024 1214   GLUCOSE 97 05/26/2019 1046   BUN 16 05/25/2024 1214   CREATININE 0.86 05/25/2024 1214   CALCIUM 9.2 05/25/2024 1214   Lab Results  Component Value Date   HGBA1C 5.5 05/25/2024   Lab Results  Component Value Date   INSULIN  5.4 05/25/2024   Lab Results  Component Value Date   TSH 1.430 05/25/2024   CBC    Component Value Date/Time   WBC 7.0 05/25/2024 1214   WBC 6.9 05/26/2019 1046   RBC 4.39 05/25/2024 1214   RBC 4.42 05/26/2019 1046   HGB 13.4 05/25/2024 1214   HCT 42.3 05/25/2024 1214   PLT 213 05/25/2024 1214   MCV 96 05/25/2024 1214   MCH 30.5 05/25/2024 1214   MCHC 31.7 05/25/2024 1214  MCHC 32.5 05/26/2019 1046   RDW 12.9 05/25/2024 1214   Iron  Studies    Component Value Date/Time   IRON  94 05/25/2024 1214   TIBC 334 05/25/2024 1214   FERRITIN 49 05/25/2024 1214   IRONPCTSAT 28 05/25/2024 1214   Lipid Panel     Component Value Date/Time   CHOL 170 05/25/2024 1214   TRIG 61 05/25/2024 1214   HDL 64 05/25/2024 1214   CHOLHDL 2.7 05/25/2024 1214   LDLCALC 94 05/25/2024 1214   Hepatic Function Panel     Component Value Date/Time   PROT 6.3 05/25/2024 1214   ALBUMIN 4.2 05/25/2024 1214   AST 32 05/25/2024 1214   ALT 38 (H) 05/25/2024 1214   ALKPHOS 103 05/25/2024 1214   BILITOT 0.3 05/25/2024 1214   BILIDIR 0.1 05/12/2017 1222      Component Value Date/Time   TSH 1.430 05/25/2024 1214   Nutritional Lab Results  Component Value Date   VD25OH 81.5 05/25/2024    Attestations:   I, ***, acting as a stage manager for Paige Jenkins, DO., have compiled all relevant documentation for today's office visit on behalf of Paige Jenkins, DO, while in the presence of Paige & Mclennan, DO.  I have spent *** minutes in the care of the patient today including *** minutes face-to-face assessing and reviewing listed medical  problems above as outlined in office visit note and providing nutritional and behavioral counseling as outlined in obesity care plan.   I have reviewed the above documentation for accuracy and completeness, and I agree with the above. Paige [name], D.O.  The 21st Century Cures Act was signed into law in 2016 which includes the topic of electronic health records.  This provides immediate access to information in MyChart.  This includes consultation notes, operative notes, office notes, lab results and pathology reports.  If you have any questions about what you read please let us  know at your next visit so we can discuss your concerns and take corrective action if need be.  We are right here with you.  "

## 2024-12-08 ENCOUNTER — Ambulatory Visit: Admitting: Internal Medicine

## 2024-12-08 ENCOUNTER — Encounter: Payer: Self-pay | Admitting: Internal Medicine

## 2024-12-08 ENCOUNTER — Ambulatory Visit (INDEPENDENT_AMBULATORY_CARE_PROVIDER_SITE_OTHER): Payer: Self-pay | Admitting: Psychology

## 2024-12-08 VITALS — BP 110/72 | HR 82 | Ht 67.0 in | Wt 215.4 lb

## 2024-12-08 DIAGNOSIS — R140 Abdominal distension (gaseous): Secondary | ICD-10-CM | POA: Diagnosis not present

## 2024-12-08 DIAGNOSIS — R1310 Dysphagia, unspecified: Secondary | ICD-10-CM

## 2024-12-08 DIAGNOSIS — R159 Full incontinence of feces: Secondary | ICD-10-CM | POA: Diagnosis not present

## 2024-12-08 DIAGNOSIS — K581 Irritable bowel syndrome with constipation: Secondary | ICD-10-CM

## 2024-12-08 DIAGNOSIS — Z860101 Personal history of adenomatous and serrated colon polyps: Secondary | ICD-10-CM | POA: Diagnosis not present

## 2024-12-08 DIAGNOSIS — F411 Generalized anxiety disorder: Secondary | ICD-10-CM

## 2024-12-08 DIAGNOSIS — T402X5A Adverse effect of other opioids, initial encounter: Secondary | ICD-10-CM | POA: Diagnosis not present

## 2024-12-08 DIAGNOSIS — K5903 Drug induced constipation: Secondary | ICD-10-CM

## 2024-12-08 DIAGNOSIS — R109 Unspecified abdominal pain: Secondary | ICD-10-CM

## 2024-12-08 DIAGNOSIS — F4310 Post-traumatic stress disorder, unspecified: Secondary | ICD-10-CM

## 2024-12-08 DIAGNOSIS — K219 Gastro-esophageal reflux disease without esophagitis: Secondary | ICD-10-CM

## 2024-12-08 DIAGNOSIS — K5900 Constipation, unspecified: Secondary | ICD-10-CM

## 2024-12-08 LAB — HEMOGLOBIN A1C
Est. average glucose Bld gHb Est-mCnc: 105 mg/dL
Hgb A1c MFr Bld: 5.3 % (ref 4.8–5.6)

## 2024-12-08 LAB — COMPREHENSIVE METABOLIC PANEL WITH GFR
ALT: 24 IU/L (ref 0–32)
AST: 26 IU/L (ref 0–40)
Albumin: 4.7 g/dL (ref 3.8–4.9)
Alkaline Phosphatase: 93 IU/L (ref 49–135)
BUN/Creatinine Ratio: 22 (ref 9–23)
BUN: 25 mg/dL — ABNORMAL HIGH (ref 6–24)
Bilirubin Total: 0.5 mg/dL (ref 0.0–1.2)
CO2: 25 mmol/L (ref 20–29)
Calcium: 10.2 mg/dL (ref 8.7–10.2)
Chloride: 98 mmol/L (ref 96–106)
Creatinine, Ser: 1.14 mg/dL — ABNORMAL HIGH (ref 0.57–1.00)
Globulin, Total: 2.3 g/dL (ref 1.5–4.5)
Glucose: 86 mg/dL (ref 70–99)
Potassium: 3.8 mmol/L (ref 3.5–5.2)
Sodium: 140 mmol/L (ref 134–144)
Total Protein: 7 g/dL (ref 6.0–8.5)
eGFR: 55 mL/min/1.73 — ABNORMAL LOW

## 2024-12-08 LAB — INSULIN, RANDOM: INSULIN: 3.7 u[IU]/mL (ref 2.6–24.9)

## 2024-12-08 LAB — VITAMIN D 25 HYDROXY (VIT D DEFICIENCY, FRACTURES): Vit D, 25-Hydroxy: 79.9 ng/mL (ref 30.0–100.0)

## 2024-12-08 MED ORDER — PRUCALOPRIDE SUCCINATE 2 MG PO TABS: 2.0000 mg | ORAL_TABLET | Freq: Every day | ORAL | 3 refills | Status: AC

## 2024-12-08 NOTE — Progress Notes (Signed)
 "    12/08/2024 Paige [name] [id number] 1966-02-24  ASSESSMENT AND PLAN:  Constipation/IBS with rare fecal incontinence Opioid-induced constipation Abdominal discomfort Bloating BM every 4 days Occ fecal leakage and bowel incontinence She is on oxycodone  for back pain, currently on 2-4 pills a day, spinal cord stimulator Following with urogyn, doing pelvic floor PT Dulcolax/senokot fecal incontinence mag citrate, amitiza , isbrela did not help Movantik and Motegrity  were too expensive Will have her try enemas daily. Current on Linzess  290 mcg every day, lactulose  TID, and Fleet enema every 4 days -Continue pelvic PT, consider ARM -Continue enemas as needed  - Cont lactulose  three times daily - Try Enemas daily - Attempt to obtain Motegrity  2mg  daily through patient assistance programs. Will see if we can approved through patient assistance - Print out Movantik savings card and see if patient qualifies - Perform bowel purge with colonoscopy prep and Senokot if constipation become more severe - Please talk to neurologist about an alternative to Aimovig , which may be contirbuting to her constipation issues - RTC in 3 months  Dysphagia s/p empiric dilation April 2025, barium swallow tertiary contractions, continues to have dysphagia History of Roux-en-Y 03/25/2019 EGD for IDA White plaques distal esophagus healthy anastomosis, 2 small jejunal erosions 02/25/2024 barium swallow poor esophageal motility stasis of contrast in the esophagus tertiary contractions 13 mm barium tablet passed without difficulty. 03/22/24 EGD for dysphagia unremarkable esophagus status post empiric dilation, gastric polyp, healthy-appearing anastomosis normal jejunum.  Path showed reflux esophagitis negative EOE, negative H. pylori, benign polyp - continue the omeprazole  BID - may be due to opioid-induced esophageal dysfunction, can consider esophageal manometry in the future  History of colon  polyps 10/04/2021 with Dr. Aneita for personal history of adenomatous polyps family history of colon cancer showed 4 polyps ranging 7 to 10 mm sigmoid colon transverse descending, small lipoma transverse recall 3 years  10/11/2024 colonoscopy Dr. San with poor preparation of the colon after 2-day prep small lipoma paroxysmal transverse colon submucosal nodule rectum 10/12/2024 colonoscopy with additional prep shows 2 sessile polyps descending colon 4 to 5 mm 7 mm polyp sigmoid colon single erosion in the rectum.  Repeat colonoscopy in 1 year with extended 2-day prep  - Next colonoscopy due in 10/2025 with 2-day prep and 3 day CLD. Hopefully will be able to get her BMs more regular before she has to start this prep  Patient Care Team: Jefferey Fitch, MD as PCP - General (Internal Medicine) Skeet Juliene SAUNDERS, DO as Consulting Physician (Neurology)  HISTORY OF PRESENT ILLNESS: 59 y.o. female with a past medical history of Roux-en-Y, constipation/IBS, IDA, GERD and others listed below presents for evaluation of Constipation and dysphagia.   Interval History: She presents with her fiance Leonce. She still has a tremendous amount of gas along with ab pain. She has to use an enema to get BMs started. She has had to use Miralax and lactulose . Miralax did not really work so she stopped this. She is taking Linzess  290 mcg that is borrowed from her fiance but is not helping. Movantik was too expensive at $600 per month. If she goes 4 days without a BM, then she has to use an enema. She is currently using lactulose  30 mL TID, which has been slightly effective. Amitiza  was not effective. Motegrity  was too expensive. She has cut back on her opioid therapy by decreasing from 6 pills per to 2-3 pills per day. She started her Aimovig  about 2 years ago, and she is  not sure if this may have led to her issues with constipation  She  reports that she has never smoked. She has never used smokeless tobacco. She  reports that she does not currently use alcohol . She reports that she does not use drugs.  RELEVANT GI HISTORY, IMAGING AND LABS:  CBC    Component Value Date/Time   WBC 7.0 05/25/2024 1214   WBC 6.9 05/26/2019 1046   RBC 4.39 05/25/2024 1214   RBC 4.42 05/26/2019 1046   HGB 13.4 05/25/2024 1214   HCT 42.3 05/25/2024 1214   PLT 213 05/25/2024 1214   MCV 96 05/25/2024 1214   MCH 30.5 05/25/2024 1214   MCHC 31.7 05/25/2024 1214   MCHC 32.5 05/26/2019 1046   RDW 12.9 05/25/2024 1214   LYMPHSABS 2.2 05/25/2024 1214   MONOABS 0.6 05/26/2019 1046   EOSABS 0.1 05/25/2024 1214   BASOSABS 0.1 05/25/2024 1214   Recent Labs    05/25/24 1214  HGB 13.4    CMP     Component Value Date/Time   NA 140 12/07/2024 1441   K 3.8 12/07/2024 1441   CL 98 12/07/2024 1441   CO2 25 12/07/2024 1441   GLUCOSE 86 12/07/2024 1441   GLUCOSE 97 05/26/2019 1046   BUN 25 (H) 12/07/2024 1441   CREATININE 1.14 (H) 12/07/2024 1441   CALCIUM 10.2 12/07/2024 1441   PROT 7.0 12/07/2024 1441   ALBUMIN 4.7 12/07/2024 1441   AST 26 12/07/2024 1441   ALT 24 12/07/2024 1441   ALKPHOS 93 12/07/2024 1441   BILITOT 0.5 12/07/2024 1441      Latest Ref Rng & Units 12/07/2024    2:41 PM 05/25/2024   12:14 PM 05/26/2019   10:46 AM  Hepatic Function  Total Protein 6.0 - 8.5 g/dL 7.0  6.3  6.9   Albumin 3.8 - 4.9 g/dL 4.7  4.2  4.2   AST 0 - 40 IU/L 26  32  36   ALT 0 - 32 IU/L 24  38  30   Alk Phosphatase 49 - 135 IU/L 93  103  98   Total Bilirubin 0.0 - 1.2 mg/dL 0.5  0.3  0.4       Current Medications:   Current Outpatient Medications (Endocrine & Metabolic):    estradiol  (ESTRACE ) 0.5 MG tablet, Take 0.5 mg by mouth daily.   levothyroxine  (SYNTHROID , LEVOTHROID) 88 MCG tablet, Take 88 mcg by mouth daily before breakfast.   liothyronine (CYTOMEL) 5 MCG tablet, Take 5 mcg by mouth daily.   metFORMIN  (GLUCOPHAGE ) 500 MG tablet, 1 with B and L, 2 at D (4 tabs daily)   progesterone  (PROMETRIUM ) 100 MG  capsule, Take 100 mg by mouth daily.  Current Outpatient Medications (Cardiovascular):    acetaZOLAMIDE  (DIAMOX ) 125 MG tablet, Take 1 tablet (125 mg total) by mouth at bedtime.   torsemide (DEMADEX) 20 MG tablet, Take 20 mg by mouth 2 (two) times daily.  Current Outpatient Medications (Respiratory):    fluticasone (FLONASE SENSIMIST) 27.5 MCG/SPRAY nasal spray, 2 sprays (1 spray in each nostril) Nasally Once a day during seasons of difficulty for 30 days   ipratropium (ATROVENT) 0.06 % nasal spray, Place 1 spray into both nostrils daily.   promethazine  (PHENERGAN ) 12.5 MG tablet, Take 1 tablet (12.5 mg total) by mouth every 8 (eight) hours as needed for nausea or vomiting.  Current Outpatient Medications (Analgesics):    Erenumab -aooe (AIMOVIG ) 70 MG/ML SOAJ, INJECT 70 MG INTO THE SKIN EVERY 28 DAYS.  ibuprofen (ADVIL) 800 MG tablet, Take 800 mg by mouth 3 (three) times daily.   Oxycodone  HCl 10 MG TABS, Take 10 mg by mouth every 6 (six) hours as needed.   Ubrogepant  (UBRELVY ) 100 MG TABS, Take 1 tablet (100 mg total) by mouth daily as needed (at onset of headaches may repeat dose after 2 hours as needed max 200 mg in 24 hours).  Current Outpatient Medications (Hematological):    Fe Fum-Fe Poly-Vit C-Lactobac (FUSION) 65-65-25-30 MG CAPS, Take by mouth.  Current Outpatient Medications (Other):    buPROPion  (WELLBUTRIN  XL) 150 MG 24 hr tablet, Take 1 tablet (150 mg total) by mouth every morning.   cariprazine  (VRAYLAR ) 3 MG capsule, Take 1 capsule (3 mg total) by mouth daily.   clonazePAM  (KLONOPIN ) 0.5 MG tablet, Take 1 tablet (0.5 mg total) by mouth 2 (two) times daily.   diazepam  (VALIUM ) 5 MG tablet, Place 1 tablet vaginally nightly as needed for muscle spasm/ pelvic pain.   DULoxetine  (CYMBALTA ) 60 MG capsule, Take 1 capsule (60 mg total) by mouth 2 (two) times daily.   estradiol  (ESTRACE ) 0.1 MG/GM vaginal cream, Place 0.5 g vaginally 2 (two) times a week. Place 0.5g nightly for two  weeks then twice a week after   lactulose  (CHRONULAC ) 10 GM/15ML solution, TAKE 15 TO 30 ML AS DIRECTED UP TO 2 TIMES DAILY FOR CONSTIPATION   linaclotide  (LINZESS ) 290 MCG CAPS capsule, Take 1 capsule (290 mcg total) by mouth daily before breakfast.   lubiprostone  (AMITIZA ) 24 MCG capsule, Take 1 capsule (24 mcg total) by mouth 2 (two) times daily with a meal.   Magnesium Citrate (MAGNESIUM GUMMIES PO), Take 125 mg by mouth 2 (two) times daily.   methylphenidate  (RITALIN ) 10 MG tablet, Take 10 mg by mouth 2 (two) times daily. (Patient taking differently: Take 20 mg by mouth 2 (two) times daily. Taking 20)   Multiple Vitamins-Minerals (MULTIVITAMIN GUMMIES ADULT PO), Take 1 each by mouth daily.   nystatin cream (MYCOSTATIN), Apply 1 application  topically daily as needed for dry skin.   Olopatadine HCl 0.2 % SOLN, 1 drop into affected eye Ophthalmic Once a day or prn for 30 days   omeprazole  (PRILOSEC) 20 MG capsule, Take 20 mg by mouth 2 (two) times daily before a meal.   ondansetron  (ZOFRAN -ODT) 4 MG disintegrating tablet, Take 1 tablet (4 mg total) by mouth every 8 (eight) hours as needed for nausea or vomiting.   polyethylene glycol-electrolytes (NULYTELY) 420 g solution, do 6 oz every 30 mins until the impaction passes, can do 1/2 gallon on first day and 1/2 gallon on 2nd day.  Can continue 2 fleets enemas a day until it resolves.   potassium chloride  (K-DUR,KLOR-CON ) 10 MEQ tablet, Take 1 tablet by mouth daily.   Propylene Glycol (SYSTANE COMPLETE) 0.6 % SOLN, Apply 2 drops to eye.   rOPINIRole  (REQUIP ) 2 MG tablet, Take 2 mg by mouth 2 (two) times daily.   tiZANidine (ZANAFLEX) 4 MG tablet, Take 4 mg by mouth 2 (two) times daily.   traZODone  (DESYREL ) 100 MG tablet, Take 2 tablets (200 mg total) by mouth at bedtime.   Vitamin D -Vitamin K (VITAMIN K2-VITAMIN D3 PO), Take 1 each by mouth daily. Vitamin K2 100 mcg/Vitamin D  50 mcg per gummie   mirabegron  ER (MYRBETRIQ ) 25 MG TB24 tablet, Take 1  tablet (25 mg total) by mouth daily. (Patient not taking: Reported on 12/08/2024)  Medical History:  Past Medical History:  Diagnosis Date   Allergic  rhinitis    Allergy    Anemia    Anxiety disorder    Back pain    Bilateral swelling of feet    Bronchitis    Cancer (HCC)    skin   Chronic fatigue    Chronic fatigue    Chronic headaches    Chronic pain    Constipation    Depression    Endometriosis    Family history of colon cancer    mother,uncles,aunts   Fibromyalgia    Fibromyalgia    Gallbladder problem    GERD (gastroesophageal reflux disease)    Hyperlipidemia    Hypothyroidism    IBS (irritable bowel syndrome)    Insomnia    Iron  deficiency    Joint pain    Laryngopharyngeal reflux (LPR)    Obesity    Osteoarthritis    Personal history of colonic polyps 04/2006   polypoid   PONV (postoperative nausea and vomiting)    RLS (restless legs syndrome)    Sleep apnea    no cpap   SOB (shortness of breath)    Swelling    Vitamin B12 deficiency    Vitamin D  deficiency    Allergies:  Allergies  Allergen Reactions   Tape Dermatitis and Itching    BURNING AND BLISTERING - USUALLY DOES OK WITH TEGADERM  Paper tape or Coban OK   Topiramate  Other (See Comments) and Shortness Of Breath    Really wierd feeling  Off balance; cloudy thinking  Really wierd feeling Off balance; cloudy thinking    Off balance; cloudy thinking   Cyclobenzaprine Other (See Comments)    Other reaction(s): Unknown Hallucinations Unknown    Flexeril [Cyclobenzaprine Hcl] Other (See Comments)    hallucinations   Meloxicam Other (See Comments)   Naltrexone Other (See Comments)    SEVERE PAIN ALL OVER   Porcine (Pork) Protein Other (See Comments)    Other reaction(s): Other (See Comments) Migraines Other reaction(s): Other (See Comments) Migraines   Sumatriptan Other (See Comments)    Difficulty breathing Other reaction(s): Other (see comments) Off balance    Wound Dressing  Adhesive Other (See Comments)    Tape - Burns and blisters; caused skin infection   Brexpiprazole Other (See Comments)    Rexulti     Restless arms   Glycopyrrolate  Other (See Comments)    Urinary retention   Imitrex [Sumatriptan Base] Other (See Comments)    Pt unsure of allergic reaction/intolerance   Lamotrigine  Other (See Comments)    Pt unsure of allergy/intolerance   Sulfa Antibiotics Other (See Comments)    Pt states she does not think she is allergic to Sulfa      Surgical History:  She  has a past surgical history that includes Roux-en-Y Gastric Bypass (04/2004); Appendectomy (1993); Cholecystectomy (1999); Repair ankle ligament (Left, 2001); Anterior cruciate ligament repair (Left, 2003); Colonoscopy (2009); Carpal tunnel release (Left, 09/25/2017); Tonsillectomy and adenoidectomy (1976); Carpal tunnel release (Right); Polypectomy; Back surgery (03/2024); Lumbar laminectomy; Panniculectomy; Trigger finger release; and De Quervain's release. Family History:  Her family history includes Cancer in her father and mother; Colon cancer in her cousin, maternal aunt, maternal uncle, and mother; Colon polyps in her cousin and sister; Depression in her mother; Diabetes in her father and sister; Heart disease in her father and mother; Hyperlipidemia in her mother; Hypertension in her mother; Leukemia in her father; Lymphoma in her father; Obesity in her mother; Skin cancer in her father; Sleep apnea in her father.  PHYSICAL EXAM: BP 110/72   Pulse 82   Ht 5' 7 (1.702 m)   Wt 215 lb 6 oz (97.7 kg)   BMI 33.73 kg/m  Physical Exam   GENERAL APPEARANCE: Obese, in no apparent distress. RESPIRATORY: Respiratory effort normal CARDIO: Regular rate ABDOMEN: Soft, non-distended, active bowel sounds in all four quadrants, mild tenderness over entire abdomen MUSCULOSKELETAL: Full range of motion, normal gait, without edema. SKIN: Dry, intact without rashes or lesions. No jaundice. NEURO:  Alert, oriented, no focal deficits. PSYCH: Cooperative, normal mood and affect.      Rosario JAYSON Kidney, MD 9:07 AM  I spent 33 minutes of time, including in depth chart review, independent review of results as outlined above, communicating results with the patient directly, face-to-face time with the patient, coordinating care, and ordering studies and medications as appropriate, and documentation.  "

## 2024-12-08 NOTE — Patient Instructions (Addendum)
 We have sent the following medications to your pharmacy for you to pick up at your convenience: Motegrity  2 mg once a day.  Please use the savings card information for cost discount.   Use enema daily.  Continue lactulose  three times daily.  Please discuss with your neurologist about taking an alternate medication for Aimovig  which could be contributing to your constipation.    Follow up in 3 months.  Thank you for entrusting me with your care and for choosing [facility], Dr. Estefana Kidney  _______________________________________________________  If your blood pressure at your visit was 140/90 or greater, please contact your primary care physician to follow up on this.  _______________________________________________________  If you are age 5 or older, your body mass index should be between 23-30. Your Body mass index is 33.73 kg/m. If this is out of the aforementioned range listed, please consider follow up with your Primary Care Provider.  If you are age 60 or younger, your body mass index should be between 19-25. Your Body mass index is 33.73 kg/m. If this is out of the aformentioned range listed, please consider follow up with your Primary Care Provider.   ________________________________________________________  The [facility] providers would like to encourage you to use MYCHART to communicate with providers for non-urgent requests or questions.  Due to long hold times on the telephone, sending your provider a message by [facility] may be a faster and more efficient way to get a response.  Please allow 48 business hours for a response.  Please remember that this is for non-urgent requests.  _______________________________________________________  [facility] is using a team-based approach to care.  Your team is made up of your doctor and two to three APPS. Our APPS (Nurse Practitioners and Physician Assistants) work with your physician to ensure care  continuity for you. They are fully qualified to address your health concerns and develop a treatment plan. They communicate directly with your gastroenterologist to care for you. Seeing the Advanced Practice Practitioners on your physician's team can help you by facilitating care more promptly, often allowing for earlier appointments, access to diagnostic testing, procedures, and other specialty referrals.

## 2024-12-08 NOTE — Progress Notes (Signed)
 " [facility] Counselor/Therapist Progress Note  Patient ID: Paige [name], MRN: [id number],    Date: 12/08/2024  Time Spent: 54 minutes  Time in:2:03 Time out:2:57  Treatment Type: Individual Therapy  Reported Symptoms: depression and worry  Mental Status Exam: Appearance:  Casual     Behavior: Appropriate  Motor: Normal  Speech/Language:  Normal Rate  Affect: blunted  Mood: pleasant  Thought process: normal  Thought content:   WNL  Sensory/Perceptual disturbances:   WNL  Orientation: oriented to person, place, time/date, and situation  Attention: Good  Concentration: Good  Memory: WNL  Fund of knowledge:  Good  Insight:   Good  Judgment:  Fair  Impulse Control: Fair   Risk Assessment: Danger to Self:  No Self-injurious Behavior: No Danger to Others: No Duty to Warn:no Physical Aggression / Violence:No  Access to Firearms a concern: No  Gang Involvement:No   Subjective: The patient attended a face-to-face individual therapy session via video visit.  The patient gave verbal consent for this session to be on video on caregility and patient is aware of the limitations of telehealth..  The patient was in her home alone and therapist was in the office alone.  The patient presents as pleasant and cooperative.  The patient reports that she feels like she is doing relatively well.  She does report that she knows that she is going to have some difficulties when her partner goes to school in a few weeks.  We talked about different ways that she can occupy her time and get back into some of the things she used to like to do.  We discussed the need for her to start thinking about what it is that she is going to do because right now she relies a lot on him to help her feel good about what she is doing.  We talked about her getting back into some exercise and also she loves to do crafting and we talked about that as well.  She is doing very very well and should be able to  graduate in March.  Interventions: Cognitive Behavioral Therapy  Diagnosis:Generalized anxiety disorder  PTSD (post-traumatic stress disorder)  Plan: Treatment Plan  Strengths/Abilities:  Intelligent, kind, motivated  Treatment Preferences:  Outpatient Individual therapy  Statement of Needs:  I need some help with getting back out into the world and learn how to interact and be social   Symptoms:  Describes a reliving of the event, particularly through dissociative flashbacks.:  (Status: improved). Displays a significant decline in interest and engagement in activities.: (Status: improved). Displays significant psychological and/or physiological  distress resulting from internal and external clues that are reminiscent of the traumatic event.:  (Status: improved). Experiences disturbances in sleep.:   (Status: improved). Experiences disturbing and persistent thoughts, images, and/or perceptions of the  traumatic event.: (Status: improved). Experiences frequent nightmares.:  (Status: improved). Has been exposed to a traumatic event involving actual or  perceived threat of death or serious injury.: (Status: maintained).  Hypervigilance (e.g., feeling constantly on edge, experiencing concentration difficulties, having trouble falling or staying asleep, exhibiting a general state of irritability).: (Status:  improved). Impairment in social, occupational, or other areas of functioning.:  (Status: improved). Intentionally avoids activities, places, people, or objects (e.g., up-armored vehicles) that evoke memories of the event.:  (Status: improved). Intentionally avoids  thoughts, feelings, or discussions related to the traumatic event.: (Status:  improved). Reports difficulty concentrating as well as feelings of guilt.:   (Status: improved). Reports response  of intense fear, helplessness, or horror to the traumatic event.: (Status: improved). Symptoms present more than one month.:  (Status:  maintained).    Problems Addressed:  Anxiety, Posttraumatic Stress Disorder (PTSD),  Goals:  LTG:  1. Enhance ability to effectively cope with the full variety of life's worries  and anxieties.  90% 2. No longer avoids persons, places, activities, and objects that are  reminiscent of the traumatic event.  90% 3. No longer experiences intrusive event recollections, avoidance of event  reminders, intense arousal, or disinterest in activities or  relationships.  100% 4. Stabilize anxiety level while increasing ability to function on a daily  basis.  90% 5. Thinks about or openly discusses the traumatic event with others  without experiencing psychological or physiological distress.  80%  STG: 1.Identify and engage in pleasant activities on a daily basis..  90% 2.Identify, challenge, and replace biased, fearful self-talk with positive, realistic, and empowering self talk 3.  Participate in Cognitive Therapy to help identify, challenge, and replace biased, negative, and selfdefeating thoughts resulting from the trauma.  80 % 4.  Participate in Eye Movement Desensitization and Reprocessing (EMDR) to reduce emotional distress  related to traumatic thoughts, feelings, and images.  90%  Target Date:  02/16/2025 Frequency: Every four weeks Modality:Individual Therapy Interventions by Therapist:  CBT, EMDR, insight oriented therapy, problem solving  Patient approved treatment plan and is progressing nicely.  Fidelia Cathers G Giani Winther, LCSW                                                                                                  "

## 2024-12-09 ENCOUNTER — Encounter: Payer: Self-pay | Admitting: Internal Medicine

## 2024-12-09 ENCOUNTER — Telehealth: Payer: Self-pay

## 2024-12-09 ENCOUNTER — Encounter: Payer: Self-pay | Admitting: Neurology

## 2024-12-09 ENCOUNTER — Ambulatory Visit (INDEPENDENT_AMBULATORY_CARE_PROVIDER_SITE_OTHER): Payer: Self-pay | Admitting: Family Medicine

## 2024-12-09 ENCOUNTER — Other Ambulatory Visit (HOSPITAL_COMMUNITY): Payer: Self-pay

## 2024-12-09 NOTE — Telephone Encounter (Signed)
Please assist if able, thank you!

## 2024-12-09 NOTE — Telephone Encounter (Signed)
 PAP: Application for Linzess has been submitted to [employer]), via fax

## 2024-12-09 NOTE — Telephone Encounter (Signed)
-----   Message from Rosario Kidney, MD sent at 12/08/2024  2:30 PM EST ----- Hi Pod B triage, could we try to get Ms. Andra set up for the patient assistance program for Motegrity ? Thank you

## 2024-12-10 ENCOUNTER — Other Ambulatory Visit: Payer: Self-pay | Admitting: Neurology

## 2024-12-10 ENCOUNTER — Other Ambulatory Visit (HOSPITAL_COMMUNITY): Payer: Self-pay

## 2024-12-10 MED ORDER — PROPRANOLOL HCL ER 60 MG PO CP24: 60.0000 mg | ORAL_CAPSULE | Freq: Every day | ORAL | 5 refills | Status: AC

## 2024-12-10 NOTE — Telephone Encounter (Signed)
 Pt made aware via my chart

## 2024-12-13 ENCOUNTER — Telehealth: Admitting: Adult Health

## 2024-12-13 ENCOUNTER — Encounter: Payer: Self-pay | Admitting: *Deleted

## 2024-12-14 ENCOUNTER — Encounter: Payer: Self-pay | Admitting: Physical Therapy

## 2024-12-21 ENCOUNTER — Encounter: Payer: Self-pay | Admitting: Physical Therapy

## 2024-12-22 ENCOUNTER — Encounter: Payer: Self-pay | Admitting: Physical Therapy

## 2024-12-23 NOTE — Telephone Encounter (Signed)
 PAP: Patient assistance application for Linzess  has been approved by PAP Companies: [employer] from 12/23/2024 to 12/01/2025. Medication should be delivered to PAP Delivery: Home. For further shipping updates, please contact [employer] ([employer] at [phone]. Patient ID is: approval letter in media tab

## 2024-12-24 ENCOUNTER — Encounter: Payer: Self-pay | Admitting: Adult Health

## 2024-12-24 ENCOUNTER — Ambulatory Visit: Admitting: Adult Health

## 2024-12-24 DIAGNOSIS — F4310 Post-traumatic stress disorder, unspecified: Secondary | ICD-10-CM

## 2024-12-24 DIAGNOSIS — F411 Generalized anxiety disorder: Secondary | ICD-10-CM

## 2024-12-24 DIAGNOSIS — F331 Major depressive disorder, recurrent, moderate: Secondary | ICD-10-CM | POA: Diagnosis not present

## 2024-12-24 DIAGNOSIS — F902 Attention-deficit hyperactivity disorder, combined type: Secondary | ICD-10-CM | POA: Diagnosis not present

## 2024-12-24 MED ORDER — METHYLPHENIDATE HCL 10 MG PO TABS: 10.0000 mg | ORAL_TABLET | Freq: Two times a day (BID) | ORAL | 0 refills | Status: AC

## 2024-12-24 MED ORDER — CLONAZEPAM 0.5 MG PO TABS: 0.5000 mg | ORAL_TABLET | Freq: Two times a day (BID) | ORAL | 2 refills | Status: AC

## 2024-12-24 NOTE — Progress Notes (Signed)
 [name] [id number] 1966-01-29 58 y.o.  Virtual Visit via Telephone Note  I connected with pt on 12/24/24 at  9:30 AM EST by telephone and verified that I am speaking with the correct person using two identifiers.   I discussed the limitations, risks, security and privacy concerns of performing an evaluation and management service by telephone and the availability of in person appointments. I also discussed with the patient that there may be a patient responsible charge related to this service. The patient expressed understanding and agreed to proceed.   I discussed the assessment and treatment plan with the patient. The patient was provided an opportunity to ask questions and all were answered. The patient agreed with the plan and demonstrated an understanding of the instructions.   The patient was advised to call back or seek an in-person evaluation if the symptoms worsen or if the condition fails to improve as anticipated.  I provided 25 minutes of non-face-to-face time during this encounter.  The patient was located at home.  The provider was located at [facility].   Paige LOISE Sayers, NP   Subjective:   Patient ID:  Paige [name] is a 59 y.o. (DOB 03-27-1966) female.  Chief Complaint: No chief complaint on file.   HPI RONETTE HANK presents for follow-up of GAD, MDD, PTSD and insomnia.   Describes mood today as ok. Pleasant. Denies tearfulness. Mood symptoms - reports some anxiety, depression and irritability. Reports lower interest and motivation. Reports chronic pain issues. Denies panic attacks. Reports some worry, rumination and over thinking a little bit. Reports mood as variable - up and down. Stating I feel like I'm doing fair. Reports taking medications as prescribed. Continues to see therapist every 4 weeks - Bambi Cottle.  Energy levels lower. Active, does not have a regular exercise routine - working with P/T. She does try to walk  30 minutes a day. Enjoys some usual interests and activities. Lives with fiance and 4 cats inside and 2 to 3 outside. Mother local and supportive. Attends church. Appetite adequate. Weight loss 53 pounds - 205 pounds- 68.5 - metformin . Working with [employer] [facility]. Reports sleeping better some nights than others. Reports an average of 5 to 6 hours. Diagnosed with sleep apnea - using BiPAP machine. Denies daytime napping. Reports difficulties with focus and concentration. Completing tasks around the house. Working part time - approximately 12 hours a week. Disabled since 2009. Denies SI or HI.  Denies AH or VH. Denies self harm. Denies substance use.  Previous medication trials: Lexapro, Paxil, Trazadone, Wellbutrin  SR, Geodon, Seroquel, Risperdal, Depakote, Topamax , Lithium, Provigil, Ambien, Cytomel, Clonazepam  and other benzodiazapines, Elavil, Effexor, Remeron, Prozac, Anafranil, Tofranil, Zoloft, Geodon, Buspar, Adderall, Ritalin , Abilify, Rexulti, Lamictal , Latuda  Review of Systems:  Review of Systems  Musculoskeletal:  Negative for gait problem.  Neurological:  Negative for tremors.  Psychiatric/Behavioral:         Please refer to HPI    Medications: I have reviewed the patient's current medications.  Current Outpatient Medications  Medication Sig Dispense Refill   [START ON 01/21/2025] methylphenidate  (RITALIN ) 10 MG tablet Take 1 tablet (10 mg total) by mouth 2 (two) times daily. 60 tablet 0   acetaZOLAMIDE  (DIAMOX ) 125 MG tablet Take 1 tablet (125 mg total) by mouth at bedtime. 30 tablet 0   buPROPion  (WELLBUTRIN  XL) 150 MG 24 hr tablet Take 1 tablet (150 mg total) by mouth every morning. 90 tablet 3   cariprazine  (  VRAYLAR ) 3 MG capsule Take 1 capsule (3 mg total) by mouth daily. 90 capsule 3   clonazePAM  (KLONOPIN ) 0.5 MG tablet Take 1 tablet (0.5 mg total) by mouth 2 (two) times daily. 60 tablet 2   diazepam  (VALIUM ) 5 MG tablet Place 1 tablet  vaginally nightly as needed for muscle spasm/ pelvic pain. 60 tablet 1   DULoxetine  (CYMBALTA ) 60 MG capsule Take 1 capsule (60 mg total) by mouth 2 (two) times daily. 180 capsule 3   Erenumab -aooe (AIMOVIG ) 70 MG/ML SOAJ INJECT 70 MG INTO THE SKIN EVERY 28 DAYS. 1 mL 11   estradiol  (ESTRACE ) 0.1 MG/GM vaginal cream Place 0.5 g vaginally 2 (two) times a week. Place 0.5g nightly for two weeks then twice a week after 42.5 g 11   estradiol  (ESTRACE ) 0.5 MG tablet Take 0.5 mg by mouth daily.     Fe Fum-Fe Poly-Vit C-Lactobac (FUSION) 65-65-25-30 MG CAPS Take by mouth.     fluticasone (FLONASE SENSIMIST) 27.5 MCG/SPRAY nasal spray 2 sprays (1 spray in each nostril) Nasally Once a day during seasons of difficulty for 30 days     ibuprofen (ADVIL) 800 MG tablet Take 800 mg by mouth 3 (three) times daily.     ipratropium (ATROVENT) 0.06 % nasal spray Place 1 spray into both nostrils daily.     lactulose  (CHRONULAC ) 10 GM/15ML solution TAKE 15 TO 30 ML AS DIRECTED UP TO 2 TIMES DAILY FOR CONSTIPATION 5400 mL 2   levothyroxine  (SYNTHROID , LEVOTHROID) 88 MCG tablet Take 88 mcg by mouth daily before breakfast.     linaclotide  (LINZESS ) 290 MCG CAPS capsule Take 1 capsule (290 mcg total) by mouth daily before breakfast. 90 capsule 0   liothyronine (CYTOMEL) 5 MCG tablet Take 5 mcg by mouth daily.     lubiprostone  (AMITIZA ) 24 MCG capsule Take 1 capsule (24 mcg total) by mouth 2 (two) times daily with a meal. 60 capsule 0   Magnesium Citrate (MAGNESIUM GUMMIES PO) Take 125 mg by mouth 2 (two) times daily.     metFORMIN  (GLUCOPHAGE ) 500 MG tablet 1 with B and L, 2 at D (4 tabs daily) 120 tablet 0   methylphenidate  (RITALIN ) 10 MG tablet Take 1 tablet (10 mg total) by mouth 2 (two) times daily. 60 tablet 0   mirabegron  ER (MYRBETRIQ ) 25 MG TB24 tablet Take 1 tablet (25 mg total) by mouth daily. (Patient not taking: Reported on 12/08/2024) 30 tablet 5   Multiple Vitamins-Minerals (MULTIVITAMIN GUMMIES ADULT PO) Take  1 each by mouth daily.     nystatin cream (MYCOSTATIN) Apply 1 application  topically daily as needed for dry skin.     Olopatadine HCl 0.2 % SOLN 1 drop into affected eye Ophthalmic Once a day or prn for 30 days     omeprazole  (PRILOSEC) 20 MG capsule Take 20 mg by mouth 2 (two) times daily before a meal.     ondansetron  (ZOFRAN -ODT) 4 MG disintegrating tablet Take 1 tablet (4 mg total) by mouth every 8 (eight) hours as needed for nausea or vomiting. 20 tablet 11   Oxycodone  HCl 10 MG TABS Take 10 mg by mouth every 6 (six) hours as needed.     polyethylene glycol-electrolytes (NULYTELY) 420 g solution do 6 oz every 30 mins until the impaction passes, can do 1/2 gallon on first day and 1/2 gallon on 2nd day.  Can continue 2 fleets enemas a day until it resolves. 4000 mL 0   potassium chloride  (K-DUR,KLOR-CON ) 10 MEQ tablet  Take 1 tablet by mouth daily.     progesterone  (PROMETRIUM ) 100 MG capsule Take 100 mg by mouth daily.     promethazine  (PHENERGAN ) 12.5 MG tablet Take 1 tablet (12.5 mg total) by mouth every 8 (eight) hours as needed for nausea or vomiting. 30 tablet 0   propranolol  ER (INDERAL  LA) 60 MG 24 hr capsule Take 1 capsule (60 mg total) by mouth daily. 30 capsule 5   Propylene Glycol (SYSTANE COMPLETE) 0.6 % SOLN Apply 2 drops to eye.     Prucalopride Succinate  (MOTEGRITY ) 2 MG TABS Take 1 tablet (2 mg total) by mouth daily. 30 tablet 3   rOPINIRole  (REQUIP ) 2 MG tablet Take 2 mg by mouth 2 (two) times daily.     tiZANidine (ZANAFLEX) 4 MG tablet Take 4 mg by mouth 2 (two) times daily.     torsemide (DEMADEX) 20 MG tablet Take 20 mg by mouth 2 (two) times daily.     traZODone  (DESYREL ) 100 MG tablet Take 2 tablets (200 mg total) by mouth at bedtime. 180 tablet 3   Ubrogepant  (UBRELVY ) 100 MG TABS Take 1 tablet (100 mg total) by mouth daily as needed (at onset of headaches may repeat dose after 2 hours as needed max 200 mg in 24 hours). 16 tablet 11   Vitamin D -Vitamin K (VITAMIN  K2-VITAMIN D3 PO) Take 1 each by mouth daily. Vitamin K2 100 mcg/Vitamin D  50 mcg per gummie     No current facility-administered medications for this visit.    Medication Side Effects: None  Allergies: Allergies[1]  Past Medical History:  Diagnosis Date   Allergic rhinitis    Allergy    Anemia    Anxiety disorder    Back pain    Bilateral swelling of feet    Bronchitis    Cancer (HCC)    skin   Chronic fatigue    Chronic fatigue    Chronic headaches    Chronic pain    Constipation    Depression    Endometriosis    Family history of colon cancer    mother,uncles,aunts   Fibromyalgia    Fibromyalgia    Gallbladder problem    GERD (gastroesophageal reflux disease)    Hyperlipidemia    Hypothyroidism    IBS (irritable bowel syndrome)    Insomnia    Iron  deficiency    Joint pain    Laryngopharyngeal reflux (LPR)    Obesity    Osteoarthritis    Personal history of colonic polyps 04/2006   polypoid   PONV (postoperative nausea and vomiting)    RLS (restless legs syndrome)    Sleep apnea    no cpap   SOB (shortness of breath)    Swelling    Vitamin B12 deficiency    Vitamin D  deficiency     Family History  Problem Relation Age of Onset   Obesity Mother    Depression Mother    Cancer Mother    Heart disease Mother    Hyperlipidemia Mother    Hypertension Mother    Colon cancer Mother        uncles,aunts   Sleep apnea Father    Cancer Father    Diabetes Father        paternal grandmother,sister   Leukemia Father    Lymphoma Father    Skin cancer Father    Heart disease Father    Colon polyps Sister    Diabetes Sister    Colon cancer Maternal Aunt  Colon cancer Maternal Uncle    Colon cancer Cousin    Colon polyps Cousin    Stomach cancer Neg Hx    Esophageal cancer Neg Hx    Rectal cancer Neg Hx     Social History   Socioeconomic History   Marital status: Significant Other    Spouse name: Leonce Ee   Number of children: 0   Years of  education: Not on file   Highest education level: Not on file  Occupational History   Occupation: RN/disabled  Tobacco Use   Smoking status: Never   Smokeless tobacco: Never  Vaping Use   Vaping status: Never Used  Substance and Sexual Activity   Alcohol  use: Not Currently    Alcohol /week: 0.0 standard drinks of alcohol     Comment: rarely   Drug use: No   Sexual activity: Yes    Birth control/protection: Other-see comments  Other Topics Concern   Not on file  Social History Narrative   Patient is left-handed. She lives alone in a one level home. She has 3 cats. She is unable to exercise. Caffeine one soda / day. On disability now. 2 bachelor degrees   Social Drivers of Health   Tobacco Use: Low Risk (12/24/2024)   Patient History    Smoking Tobacco Use: Never    Smokeless Tobacco Use: Never    Passive Exposure: Not on file  Financial Resource Strain: Low Risk (04/06/2024)   Received from [facility]   Overall Financial Resource Strain (CARDIA)    Difficulty of Paying Living Expenses: Not hard at all  Food Insecurity: Low Risk (08/05/2024)   Received from [facility]   Epic    Within the past 12 months, you worried that your food would run out before you got money to buy more: Never true    Within the past 12 months, the food you bought just didn't last and you didn't have money to get more. : Never true  Transportation Needs: No Transportation Needs (08/05/2024)   Received from [facility]    In the past 12 months, has lack of reliable transportation kept you from medical appointments, meetings, work or from getting things needed for daily living? : No  Physical Activity: Not on file  Stress: Not on file  Social Connections: Not on file  Intimate Partner Violence: Not on file  Depression (PHQ2-9): High Risk (05/25/2024)   Depression (PHQ2-9)    PHQ-2 Score: 11  Alcohol  Screen: Not on file  Housing: Low Risk (08/05/2024)   Received from [facility]    Epic    What is your living situation today?: I have a steady place to live    Think about the place you live. Do you have problems with any of the following? Choose all that apply:: None/None on this list  Utilities: Low Risk (08/05/2024)   Received from [facility]   Utilities    In the past 12 months has the electric, gas, oil, or water company threatened to shut off services in your home? : No  Health Literacy: Not on file    Past Medical History, Surgical history, Social history, and Family history were reviewed and updated as appropriate.   Please see review of systems for further details on the patient's review from today.   Objective:   Physical Exam:  There were no vitals taken for this visit.  Physical Exam Constitutional:      General: She is not in acute distress. Musculoskeletal:  General: No deformity.  Neurological:     Mental Status: She is alert and oriented to person, place, and time.     Coordination: Coordination normal.  Psychiatric:        Attention and Perception: Attention and perception normal. She does not perceive auditory or visual hallucinations.        Mood and Affect: Mood normal. Mood is not anxious or depressed. Affect is not labile, blunt, angry or inappropriate.        Speech: Speech normal.        Behavior: Behavior normal.        Thought Content: Thought content normal. Thought content is not paranoid or delusional. Thought content does not include homicidal or suicidal ideation. Thought content does not include homicidal or suicidal plan.        Cognition and Memory: Cognition and memory normal.        Judgment: Judgment normal.     Comments: Insight intact     Lab Review:     Component Value Date/Time   NA 140 12/07/2024 1441   K 3.8 12/07/2024 1441   CL 98 12/07/2024 1441   CO2 25 12/07/2024 1441   GLUCOSE 86 12/07/2024 1441   GLUCOSE 97 05/26/2019 1046   BUN 25 (H) 12/07/2024 1441   CREATININE 1.14 (H) 12/07/2024 1441    CALCIUM 10.2 12/07/2024 1441   PROT 7.0 12/07/2024 1441   ALBUMIN 4.7 12/07/2024 1441   AST 26 12/07/2024 1441   ALT 24 12/07/2024 1441   ALKPHOS 93 12/07/2024 1441   BILITOT 0.5 12/07/2024 1441       Component Value Date/Time   WBC 7.0 05/25/2024 1214   WBC 6.9 05/26/2019 1046   RBC 4.39 05/25/2024 1214   RBC 4.42 05/26/2019 1046   HGB 13.4 05/25/2024 1214   HCT 42.3 05/25/2024 1214   PLT 213 05/25/2024 1214   MCV 96 05/25/2024 1214   MCH 30.5 05/25/2024 1214   MCHC 31.7 05/25/2024 1214   MCHC 32.5 05/26/2019 1046   RDW 12.9 05/25/2024 1214   LYMPHSABS 2.2 05/25/2024 1214   MONOABS 0.6 05/26/2019 1046   EOSABS 0.1 05/25/2024 1214   BASOSABS 0.1 05/25/2024 1214    No results found for: POCLITH, LITHIUM   No results found for: PHENYTOIN, PHENOBARB, VALPROATE, CBMZ   .res Assessment: Plan:   Plan:  1. Wellbutrin  XL 150mg  daily 2. Cymbalta  60mg  BID 3. Trazadone 100mg  - 2 at hs  4. Vraylar  3mg  daily though patient assistance.  5. Clonazepam  0.5mg  BID - taking at bedtime 6. Ritalin  10mg  BID - thru PCP  Continue therapy with Bambi Cottle.   RTC 6/8 weeks  25 minutes spent dedicated to the care of this patient on the date of this encounter to include pre-visit review of records, ordering of medication, post visit documentation, and face-to-face time with the patient discussing GAD, MDD, PTSD and insomnia. Discussed continuing current medication regimen.  Patient advised to contact office with any questions, adverse effects, or acute worsening in signs and symptoms.  Discussed potential metabolic side effects associated with atypical antipsychotics, as well as potential risk for movement side effects. Advised pt to contact office if movement side effects occur.   Discussed potential benefits, risk, and side effects of benzodiazepines to include potential risk of tolerance and dependence, as well as possible drowsiness. Advised patient not to drive if  experiencing drowsiness and to take lowest possible effective dose to minimize risk of dependence and tolerance.   Diagnoses and  all orders for this visit:  Major depressive disorder, recurrent episode, moderate (HCC)  Generalized anxiety disorder -     clonazePAM  (KLONOPIN ) 0.5 MG tablet; Take 1 tablet (0.5 mg total) by mouth 2 (two) times daily.  Attention deficit hyperactivity disorder (ADHD), combined type -     methylphenidate  (RITALIN ) 10 MG tablet; Take 1 tablet (10 mg total) by mouth 2 (two) times daily. -     methylphenidate  (RITALIN ) 10 MG tablet; Take 1 tablet (10 mg total) by mouth 2 (two) times daily.  PTSD (post-traumatic stress disorder)    Please see After Visit Summary for patient specific instructions.  Future Appointments  Date Time Provider Department Center  01/03/2025  4:00 PM Midge Sober, DO [facility] None  01/05/2025  2:00 PM Cottle, Bambi G, LCSW [facility] None  01/11/2025  2:00 PM Elnor Channing FALCON, PT [facility]  01/18/2025  1:00 PM Marilynne Rosaline SAILOR, MD [facility]  01/31/2025  2:00 PM Midge Sober, DO [facility] None  02/01/2025  8:00 PM Theodoro Lakes, MD [facility]  02/02/2025  2:00 PM Cottle, Peggye MATSU, LCSW [facility] None  02/17/2025  9:30 AM Theodoro Lakes, MD [facility] [id number]  02/23/2025  2:00 PM Cottle, Peggye MATSU, LCSW [facility] None  09/13/2025 10:50 AM Skeet, Adam R, DO [facility] None    No orders of the defined types were placed in this encounter.     -------------------------------     [1]  Allergies Allergen Reactions   Tape Dermatitis and Itching    BURNING AND BLISTERING - USUALLY DOES OK WITH TEGADERM  Paper tape or Coban OK   Topiramate  Other (See Comments) and Shortness Of Breath    Really wierd feeling  Off balance; cloudy thinking  Really wierd feeling Off balance; cloudy thinking    Off balance; cloudy thinking   Cyclobenzaprine Other (See Comments)    Other reaction(s): Unknown Hallucinations Unknown    Flexeril  [Cyclobenzaprine Hcl] Other (See Comments)    hallucinations   Meloxicam Other (See Comments)   Naltrexone Other (See Comments)    SEVERE PAIN ALL OVER   Porcine (Pork) Protein Other (See Comments)    Other reaction(s): Other (See Comments) Migraines Other reaction(s): Other (See Comments) Migraines   Sumatriptan Other (See Comments)    Difficulty breathing Other reaction(s): Other (see comments) Off balance    Wound Dressing Adhesive Other (See Comments)    Tape - Burns and blisters; caused skin infection   Brexpiprazole Other (See Comments)    Rexulti     Restless arms   Glycopyrrolate  Other (See Comments)    Urinary retention   Imitrex [Sumatriptan Base] Other (See Comments)    Pt unsure of allergic reaction/intolerance   Lamotrigine  Other (See Comments)    Pt unsure of allergy/intolerance   Sulfa Antibiotics Other (See Comments)    Pt states she does not think she is allergic to Sulfa

## 2024-12-28 ENCOUNTER — Encounter: Payer: Self-pay | Admitting: Physical Therapy

## 2024-12-30 ENCOUNTER — Encounter: Admitting: Physical Therapy

## 2024-12-30 ENCOUNTER — Encounter: Payer: Self-pay | Admitting: Physical Therapy

## 2024-12-30 DIAGNOSIS — M6281 Muscle weakness (generalized): Secondary | ICD-10-CM | POA: Diagnosis not present

## 2024-12-30 DIAGNOSIS — R278 Other lack of coordination: Secondary | ICD-10-CM

## 2024-12-30 DIAGNOSIS — L905 Scar conditions and fibrosis of skin: Secondary | ICD-10-CM

## 2024-12-30 DIAGNOSIS — R102 Pelvic and perineal pain unspecified side: Secondary | ICD-10-CM

## 2024-12-30 NOTE — Therapy (Signed)
 " OUTPATIENT PHYSICAL THERAPY FEMALE PELVIC TREATMENT   Patient Name: Paige [name] MRN: [id number] DOB:12/15/1965, 59 y.o., female Today's Date: 12/30/2024    END OF SESSION:  PT End of Session - 12/30/24 1045     Visit Number 11    Date for Recertification  02/10/25    Authorization Type [employer]    PT Start Time 1030    PT Stop Time 1120    PT Time Calculation (min) 50 min    Activity Tolerance Patient tolerated treatment well    Behavior During Therapy WFL for tasks assessed/performed          Past Medical History:  Diagnosis Date   Allergic rhinitis    Allergy    Anemia    Anxiety disorder    Back pain    Bilateral swelling of feet    Bronchitis    Cancer (HCC)    skin   Chronic fatigue    Chronic fatigue    Chronic headaches    Chronic pain    Constipation    Depression    Endometriosis    Family history of colon cancer    mother,uncles,aunts   Fibromyalgia    Fibromyalgia    Gallbladder problem    GERD (gastroesophageal reflux disease)    Hyperlipidemia    Hypothyroidism    IBS (irritable bowel syndrome)    Insomnia    Iron  deficiency    Joint pain    Laryngopharyngeal reflux (LPR)    Obesity    Osteoarthritis    Personal history of colonic polyps 04/2006   polypoid   PONV (postoperative nausea and vomiting)    RLS (restless legs syndrome)    Sleep apnea    no cpap   SOB (shortness of breath)    Swelling    Vitamin B12 deficiency    Vitamin D  deficiency    Past Surgical History:  Procedure Laterality Date   ANTERIOR CRUCIATE LIGAMENT REPAIR Left 2003   APPENDECTOMY  1993   BACK SURGERY  03/2024   spinal cord stimulator placed   CARPAL TUNNEL RELEASE Left 09/25/2017   Procedure: LEFT CARPAL TUNNEL RELEASE;  Surgeon: Murrell Kuba, MD;  Location: [facility];  Service: Orthopedics;  Laterality: Left;   CARPAL TUNNEL RELEASE Right    CHOLECYSTECTOMY  1999   COLONOSCOPY  2009   [facility]   DE QUERVAIN'S RELEASE      LUMBAR LAMINECTOMY     L4- S1   PANNICULECTOMY     POLYPECTOMY     REPAIR ANKLE LIGAMENT Left 2001   ROUX-EN-Y GASTRIC BYPASS  04/2004   Dr Donnice Lunger   TONSILLECTOMY AND ADENOIDECTOMY  1976   TRIGGER FINGER RELEASE     Patient Active Problem List   Diagnosis Date Noted   High-tone pelvic floor dysfunction 05/18/2024   SUI (stress urinary incontinence, female) 05/18/2024   Overactive bladder 05/18/2024   Incontinence of feces 05/18/2024   Vaginal atrophy 05/18/2024   Chronic idiopathic constipation 05/18/2024   Nutritional deficiency 03/17/2024   OSA on CPAP 03/17/2024   Encounter for orthopedic follow-up care 01/31/2020   Pain of left hand 12/19/2018   Radial styloid tenosynovitis 12/19/2018   Trigger finger 12/19/2018   Abscess of postoperative wound of abdominal wall 10/05/2018   Pannus, abdominal 09/21/2018   Pigmented skin lesion 06/23/2018   Cervicalgia 02/12/2018   Bilateral carpal tunnel syndrome 08/13/2017   Primary osteoarthritis of both first carpometacarpal joints 08/13/2017   Muscle tension dysphonia  08/12/2016   Vocal cord atrophy 08/12/2016   Hoarseness 07/02/2016   Laryngopharyngeal reflux (LPR) 07/02/2016   Cough 05/23/2016   Dyspnea 05/23/2016   Chronic pain 05/22/2016   Sacrococcygeal disorders, not elsewhere classified 11/24/2015   Anemia, iron  deficiency 08/24/2015   Fibrositis 08/17/2015   Abnormal weight gain 08/17/2015   Anxiety 08/17/2015   Recurrent major depressive episodes 08/17/2015   History of migraine headaches 08/17/2015   Neurosis, posttraumatic 08/17/2015   Bariatric surgery status 08/17/2015   DDD (degenerative disc disease), lumbar 01/18/2015   Facet joint syndrome 01/18/2015   Thoracic spondylosis 01/18/2015   Myofascial pain 02/22/2013   Back ache 07/09/2012   Family history of malignant neoplasm of gastrointestinal tract 06/10/2011   History of colonic polyps 06/10/2011   Esophageal reflux 06/10/2011   Cellulitis  06/10/2011   History of Roux-en-Y gastric bypass 06/10/2011   B12 nutritional deficiency 06/10/2011   Morbid obesity due to excess calories (HCC) 06/10/2011   Personal history of fibromyalgia 06/10/2011   Recurrent major depressive disorder, in partial remission 06/10/2011   Acid reflux 06/10/2011    PCP: Viona Fitch, MD  REFERRING PROVIDER: Marilynne Rosaline SAILOR, MD   REFERRING DIAG:  M62.89 (ICD-10-CM) - High-tone pelvic floor dysfunction  N39.3 (ICD-10-CM) - SUI (stress urinary incontinence, female)  N32.81 (ICD-10-CM) - Overactive bladder  R15.9 (ICD-10-CM) - Incontinence of feces, unspecified fecal incontinence type    THERAPY DIAG:  Muscle weakness (generalized)  Other lack of coordination  Scar  Pelvic pain  Rationale for Evaluation and Treatment: Rehabilitation  ONSET DATE: 12/2023  SUBJECTIVE:                                                                                                                                                                                           SUBJECTIVE STATEMENT: I am having back surgery on 01/31/25.   Fluid intake:  100oz water, 1 small bottle diet dr pepper per day. Has an occasional coffee. Usually only has sips of water before bed.   FUNCTIONAL LIMITATIONS: Patient uses a stick for walking  PERTINENT HISTORY:  Surgeries: Cholecystectomy; appendectomy; back surgery with spinal cored stimulator; Roux-EN-Y gastric bypass Other: Skin Caner; Fibromyalgia; IBS; Hypothyroidism;    PAIN:  Are you having pain? Yes NPRS scale: 5/10 10/19/24: pain level is 6/10 12/07/24: pain level 3/10 12/30/24: pain level 4/10 Pain location: lower abdominal area  Pain type: aching Pain description: constant   Aggravating factors: needing to have a bowel movement and not able to Relieving factors: heat at one tim  PRECAUTIONS: Other: skin cancer; spinal cord stimulator  RED FLAGS: None   WEIGHT BEARING RESTRICTIONS: No  FALLS:   Has patient fallen in last 6 months? No  OCCUPATION: disability  ACTIVITY LEVEL : Patient has stopped exercising since 9/1 due to back pain  PLOF: Independent  PATIENT GOALS: be able to urinate better, not have to use an enema to have a bowel movement, reduce pelvic pain   BOWEL MOVEMENT: Pain with bowel movement: Yes, 8-9/10 Type of bowel movement:0-4 time(s) per week ; strains; Type 1, hard rocks Fully empty rectum: No Leakage: Consistency with leakage: soft                                                      Caused by: small amounts weekly, usually after an incomplete BM  Pads: Yes: depends at night Fiber supplement/laxative stool softener (dulcolax 5mg  BID), Ibrelsa BID   URINATION: Pain with urination: Yes, burning of retention Fully empty bladder: Nonot able to urinate right away, sometimes needs to run water; she will wipe then urinate again                          Post-void dribble: No Stream: Weak Urgency: No Frequency:during the day  8+                                                           Nocturia: Yes:  2-6 times per night to void    10/05/24: wakes up 2 times Leakage: Coughing, Sneezing, Laughing, and without sensation Pads/briefs:  0-2 pads per day ; depends  at night  INTERCOURSE:  Ability to have vaginal penetration Yes  Pain with intercourse: Initial Penetration and Deep Penetration Dryness: Yes    PROLAPSE: None   OBJECTIVE:  Note: Objective measures were completed at Evaluation unless otherwise noted.  DIAGNOSTIC FINDINGS:  Pelvic floor strength I/V, puborectalis I/V external anal sphincter I/V   Pelvic floor musculature: Right levator tender, Right obturator tender, Left levator tender, Left obturator tender. High tone throughout Post Void Residual 10 mL      COGNITION: Overall cognitive status: Within functional limits for tasks assessed     SENSATION: Light touch: Appears intact   GAIT: Assistive device utilized: walking  stick  POSTURE: rounded shoulders, forward head, and decreased lumbar lordosis; Right shoulder higher; patient had scoliosos   LUMBARAROM/PROM:not tested due to fusion    LOWER EXTREMITY MNF:qloo bilateral hip ROM   LOWER EXTREMITY MMT:  MMT Right eval Left eval Right/Left 12/07/24  Hip flexion 4/5 4/5 4/5  Hip extension 4/5 4/5 4/5  Hip abduction 3/5 3/5 3/5   (Blank rows = not tested) PALPATION:  Abdominal: tenderness of the lower abdomen; increased lower rib cage angle  Diastasis: No Distortion: No  Breathing: contract the abdominals and will raise the lower rib cage Scar tissue: Yes: along the lower abdomen, lower umbilicus,  12/30/24: scar on lower abdomen has increased mobility and softness                External Perineal Exam: redness along the posterior introitus, tenderness located on the perineal body and around the rectum  Internal Pelvic Floor: therapist was not able to get the index finger past the DIP joint into the vaginal canal due to pain; going through the rectum the puborectalis did not move forward well, tenderness throughout the levator ani and obturator internist and in the rectum.   Patient confirms identification and approves PT to assess internal pelvic floor and treatment Yes No emotional/communication barriers or cognitive limitation. Patient is motivated to learn. Patient understands and agrees with treatment goals and plan. PT explains patient will be examined in standing, sitting, and lying down to see how their muscles and joints work. When they are ready, they will be asked to remove their underwear so PT can examine their perineum. The patient is also given the option of providing their own chaperone as one is not provided in our facility. The patient also has the right and is explained the right to defer or refuse any part of the evaluation or treatment including the internal exam. With the patient's consent, PT will use  one gloved finger to gently assess the muscles of the pelvic floor, seeing how well it contracts and relaxes and if there is muscle symmetry. After, the patient will get dressed and PT and patient will discuss exam findings and plan of care. PT and patient discuss plan of care, schedule, attendance policy and HEP activities.   PELVIC MMT:   MMT eval 09/16/24 10/05/24 12/30/24  Vaginal 2/5 4/5  4/5  Internal Anal Sphincter 2/5  2/5 2/5  External Anal Sphincter 2/5  2/5 2/5  Puborectalis 2/5  2/5 2/5  (Blank rows = not tested)        TONE: Increased tone of the pelvic floor  PROLAPSE: none  TODAY'S TREATMENT:  12/30/24 Manual: Internal pelvic floor techniques: No emotional/communication barriers or cognitive limitation. Patient is motivated to learn. Patient understands and agrees with treatment goals and plan. PT explains patient will be examined in standing, sitting, and lying down to see how their muscles and joints work. When they are ready, they will be asked to remove their underwear so PT can examine their perineum. The patient is also given the option of providing their own chaperone as one is not provided in our facility. The patient also has the right and is explained the right to defer or refuse any part of the evaluation or treatment including the internal exam. With the patient's consent, PT will use one gloved finger to gently assess the muscles of the pelvic floor, seeing how well it contracts and relaxes and if there is muscle symmetry. After, the patient will get dressed and PT and patient will discuss exam findings and plan of care. PT and patient discuss plan of care, schedule, attendance policy and HEP activities.  Therapist gloved finger in the vaginal canal working on the introitus, levator ani and obturator internist to elongate the muscles and educate her partner on how to perform the manual work on the model Therapist gloved finger mobilizing the clitoris in multiple  directions to elongate the fascia and then educated her partner on how to perform at home and return demonstration Therapist gloved finger working on the ischiocavernosus and educated patient partner to perform home and return demonstration.  Self-care: Reviewed bowel health techniques    12/07/24 Neuromuscular re-education: Core facilitation: Transverse abdominus contraction in supine with therapist giving verbal and tactile cues to educated on the feeling of the contraction 20 x  Supine marching with abdominal contraction 20 x  Supine heel slides with core engagement  10 x each Supine knee fallouts with core engagement 10 x each Therapeutic activities: Functional strengthening activities: Discussed with patient on how the diaphragmatic breathing helps with urination and bowel movements.  Self-care: Discussed with patient on her high co-pay and if she would like to continue, come monthly or be discharged. She will advise therapist by the next visit Discussed with patient on good bowel health that includes healthy eating, eating at same time of day and same amount for each meal.  Educated patient on the model on how to mobilize the clitoris, ischiocavernosus, perineal body externally and internally, mobilize the pelvic floor muscles internally using lubricant. Gave patient samples of Gisele searles    11/16/24 Manual: Soft tissue mobilization: Manual work to the pelvic floor externally going through her clothes due to patient leaking stool so not comfortable with therapist going internal Scar tissue mobilization: Manual work to the lower abdominal scar to improve mobility Myofascial release: Fascial release to the mesenteric root, lower abdomen. Along the right quadrant, and around the upper abdomen to reduce the restrictions and help with the stool to move through.  Self-care: Discussed with patient if she is using the valium  vaginally, bowel prep and enemas. Discussed when she will do them  and how they may help her with her issues.          PATIENT EDUCATION:  09/23/24 Education details: Access Code: [email or web address], scar massage Person educated: Patient Education method: Explanation, Demonstration, Tactile cues, Verbal cues, and Handouts Education comprehension: verbalized understanding, returned demonstration, verbal cues required, tactile cues required, and needs further education  HOME EXERCISE PROGRAM: 09/23/24 Access Code: [email or web address] URL: [email or web address] Date: 09/23/2024 Prepared by: Channing Pereyra  Exercises - Supine Diaphragmatic Breathing  - 2 x daily - 7 x weekly - 1 sets - 10 reps - Seated Diaphragmatic Breathing  - 2 x daily - 7 x weekly - 1 sets - 10 reps - Shoulder Extension with Resistance - 90/90 on Swiss Ball  - 1 x daily - 7 x weekly - 1 sets - 10 reps - Hooklying Anti-Rotation Press With Anchored Resistance  - 1 x daily - 7 x weekly - 1 sets - 10 reps - Hooklying Transversus Abdominis Palpation  - 1 x daily - 7 x weekly - 1 sets - 10 reps - Standing Shoulder Extension with Resistance  - 1 x daily - 3 x weekly - 1 sets - 10 reps - Standing Diagonal Shoulder Extension with Anchored Resistance  - 1 x daily - 3 x weekly - 1 sets - 10 reps - Standing Shoulder Flexion with Posterior Anchored Resistance  - 1 x daily - 3 x weekly - 1 sets - 10 reps  Patient Education - Abdominal Massage for Constipation - Abdominal Massage for Constipation   ASSESSMENT:  CLINICAL IMPRESSION: Patient is a 59 y.o. female who was seen today for physical therapy  treatment for high toned pelvic floor, urinary and fecal incontinence. Patient is not leaking urine. She does not have  trouble starting a urine stream.   Patient is using the squatty potty and breathing to urinate. Lower abdominal scar pain is 70% better. Patient has to strain to have a bowel movement but is improved by 20%.  She will leak stool when she is taking the laxative that is prescribed and during  night but not as often. Her stool leakage is 50% better.  Pelvic floor strength is 4/5. External anal sphincter and internal anal sphincter is 2/5. Her partner was educated on  how to perform manual work to the clitoris, ischiocavernosus, and pelvic floor muscles. Patient will be having back fusion on 01/31/25 so she want sto be discharged at this time.   OBJECTIVE IMPAIRMENTS: decreased activity tolerance, decreased coordination, decreased strength, increased fascial restrictions, increased muscle spasms, and pain.   ACTIVITY LIMITATIONS: sleeping, continence, and toileting  PARTICIPATION LIMITATIONS: meal prep, cleaning, laundry, interpersonal relationship, shopping, and community activity  PERSONAL FACTORS: Cholecystectomy; appendectomy; back surgery with spinal cored stimulator; Roux-EN-Y gastric bypass are also affecting patient's functional outcome.   REHAB POTENTIAL: Excellent  CLINICAL DECISION MAKING: Unstable/unpredictable  EVALUATION COMPLEXITY: High   GOALS: Goals reviewed with patient? Yes  SHORT TERM GOALS: Target date: 09/23/24  Patient educated on abdominal massage to assist with peristalic motion of the intestines.  Baseline: Goal status: Met 09/02/24  2.  Patient educated on scar mobilization to improve mobility and reduce pain.  Baseline:  Goal status: Met 09/02/24  3.  Patient is able to perform diaphragmatic breathing to lengthen the pelvic floor to urinate and have a bowel movement.  Baseline:  Goal status: Met 09/09/24  4.  Patient educated on correct ways to have a bowel movement to reduce straining.  Baseline:  Goal status: Met 09/16/24  LONG TERM GOALS: Target date: 11/18/24; new target date 02/10/25  Patient independent with advanced HEP for core and pelvic floor strength to improve continence.  Baseline:  Goal status: Met 12/30/24  2.  Patient is able to sit on commode and urinate within 15 seconds due to the ability to relax her pelvic floor.   Baseline: most of the time Goal status: ongoing 12/07/24  3.  Patient is able to sit on the commode to have a bowel movement and not strain 50% of the time due to the ability to relax the pelvic floor.  Baseline:  improved by 20% Goal status: not met 12/30/24  4.  Patient has increased in lower abdominal scar to reduce her lower abdominal pain </= 2/10.  Baseline:  Goal status: Met 11/16/24  5.  Patient reports her urinary leakage decreased </= 70% due to the ability to fully relax the pelvic floor and contract and reduction of trigger points.  Baseline: urinary leakage decreased by 80% Goal status: Met 11/02/24   PLAN:Discharge to HEP this visit   Channing Pereyra, PT 12/30/24 11:32 AM   PHYSICAL THERAPY DISCHARGE SUMMARY  Visits from Start of Care: 11  Current functional level related to goals / functional outcomes: See above. Patient will be having back surgery for fusion on 01/31/25 so she is being discharged at this visit.    Remaining deficits: See above.    Education / Equipment: HEP   Patient agrees to discharge. Patient goals were partially met. Patient is being discharged due to a change in medical status. Thank you for the referral.   Channing Pereyra, PT 12/30/24 11:32 AM   "

## 2024-12-31 ENCOUNTER — Ambulatory Visit (INDEPENDENT_AMBULATORY_CARE_PROVIDER_SITE_OTHER): Admitting: Family Medicine

## 2024-12-31 ENCOUNTER — Encounter (INDEPENDENT_AMBULATORY_CARE_PROVIDER_SITE_OTHER): Payer: Self-pay

## 2024-12-31 DIAGNOSIS — E559 Vitamin D deficiency, unspecified: Secondary | ICD-10-CM

## 2024-12-31 DIAGNOSIS — E88819 Insulin resistance, unspecified: Secondary | ICD-10-CM

## 2024-12-31 DIAGNOSIS — K760 Fatty (change of) liver, not elsewhere classified: Secondary | ICD-10-CM

## 2024-12-31 DIAGNOSIS — F39 Unspecified mood [affective] disorder: Secondary | ICD-10-CM

## 2024-12-31 DIAGNOSIS — Z6838 Body mass index (BMI) 38.0-38.9, adult: Secondary | ICD-10-CM

## 2024-12-31 MED ORDER — VITAMIN K2-VITAMIN D3 90-125 MCG PO CAPS: 1.0000 | ORAL_CAPSULE | Freq: Every day | ORAL | Status: AC

## 2024-12-31 NOTE — Progress Notes (Incomplete)
 "  Paige [name], D.O.  ABFM, ABOM Specializing in Clinical Bariatric Medicine  Office located at: [address] [place], [place]  [address]      Medications Discontinued During This Encounter  Medication Reason   mirabegron  ER (MYRBETRIQ ) 25 MG TB24 tablet    lubiprostone  (AMITIZA ) 24 MCG capsule    Erenumab -aooe (AIMOVIG ) 70 MG/ML SOAJ      Meds ordered this encounter  Medications   Vitamin D -Vitamin K (VITAMIN K2 -VITAMIN D3) 90-125 MCG CAPS    Sig: Take 1 tablet by mouth daily. **  take 1 tab twice weekly  **      FOR THE CHRONIC DISEASE OF OBESITY:  Chief complaint: Obesity Paige [name] is here to discuss her progress with her obesity treatment plan.   History of present illness / Interval history:  Paige [name] is here today for her follow-up office visit.  Since last OV on 12/07/24 with provider: Dr.O, patient suboptimal adherence  Pt has been struggling with:  []  meal planning and prepping []  exercise []  sleep []  stressors []  mood []  chronic or acute medical conditions-  []  eating out more [x]  Journaling  Pt has been working on and improving their:  []  meal planning and prepping []  exercise []  sleep hygiene []  water intake []  strategies to better manage personal stressors []  strategies to better manage mood []  eating out less [x]  Nothing particular at this time  When asked by CMA prior to our office visit today, pt states they have been:  - Focused on eating fresh, unprocessed foods?  Yes   - Focused on eating lean proteins with each meal?  Yes   - Sleeping 7-9 Hours/ Night?   No - Skipping Meals?  No   - States she is following her healthy eating plan approximately 90 % of the time.   Recent weight loss data history   12/07/24 10:00 12/31/24 12:00   Body Fat % 47.1 % 44.8 %  Muscle Mass (lbs) 106.2 lbs 106.6 lbs  Fat Mass (lbs) 99.4 lbs 91.2 lbs  Total Body Water (lbs) 85 lbs 80.8 lbs  Visceral Fat Rating  13 12    Counseling done  on how various foods/ behaviors will affect these numbers and how to maximize weight loss success discussed today in detail based on these findings. Total lbs lost to date: - 56 lbs Total Fat Mass in lbs lost to date: - 32 lbs Total weight loss percentage to date since starting program:  - 21.62 %    Morbid obesity (HCC) Starting BMI 40.57 BMI 38.0-38.9,adult current 31.79  Physician directed Nutrition Therapy prescription: She is on keeping a food journal and adhering to recommended goals of 1000-1100 calories and 80+ protein.    Paige [name] is currently in the action stage of change. As such, her goal is to journal.    She has agreed to: continue current reduced-calorie meal plan    Physician directed Behavioral Modification prescription: Evidence-based interventions for healthy behavior change were utilized today including the discussion of small changes and SMART goals.  barriers to successful adherence to behavorial change for wt loss: weather  We discussed the following today: increasing lean protein intake to established goals, work on managing stress, creating time for self-care and relaxation, and continue to practice mindfulness when eating    Physician directed Physical Activity prescription:  Paige [name] is walking 60  minutes 2 days per week  barriers to successful adherence to exercise for wt loss: cold weather  Paige [name] has been educated on importance of strength training to enhance/ sustain muscle mass along with prudent nutrition and how exercise can improve and help manage stress, thus leading to less emotional eating and less poor food choices  Exercise prescription: She has agreed to Combine aerobic and strengthening exercises for efficiency and improved cardiometabolic health.    Medical Interventions/ Pharmacotherapy  Previously tried medical weight loss medications/ therapies: None  Pharmacotherapy for weight loss: She is currently taking Metformin  1  pill for B and L and 2 pill for dinner for medical weight loss.     We discussed various medication options to help Paige [name] with her weight loss efforts and we both agreed to:  Adequate clinical response to anti-obesity medication, continue current anti-obesity regimen  SPECIFIC behavorial / nutritional / exercise goals for next office visit:   Try at home workout videos    B) OBESITY RELATED CONDITIONS ADDRESSED TODAY:  Insulin  resistance Assessment & Plan Lab Results  Component Value Date   HGBA1C 5.3 12/07/2024   HGBA1C 5.5 05/25/2024   INSULIN  3.7 12/07/2024   INSULIN  5.4 05/25/2024      Component Value Date/Time   NA 140 12/07/2024 1441   K 3.8 12/07/2024 1441   CL 98 12/07/2024 1441   CO2 25 12/07/2024 1441   GLUCOSE 86 12/07/2024 1441   GLUCOSE 97 05/26/2019 1046   BUN 25 (H) 12/07/2024 1441   CREATININE 1.14 (H) 12/07/2024 1441   CALCIUM 10.2 12/07/2024 1441   PROT 7.0 12/07/2024 1441   ALBUMIN 4.7 12/07/2024 1441   AST 26 12/07/2024 1441   ALT 24 12/07/2024 1441   ALKPHOS 93 12/07/2024 1441   BILITOT 0.5 12/07/2024 1441   GFR 52.30 (L) 05/26/2019 1046   EGFR 55 (L) 12/07/2024 1441    On Metformin  1 pill for B and L and 2 pills for dinner. With reported good compliance and tolerance. A1C levels and insulin  levels have improved. Patient labs are stable. Continue with medications and following prudent meal plan.   Patients creatinine levels show elevation and could be due to NSAID usage. Patient states that she had taken some Advil before the labs were drawn. BUN levels show that patient needs to increase water. Reminded patient that half of her body weight in oz is the proper hydration.     Vitamin D  deficiency Assessment & Plan Lab Results  Component Value Date   VD25OH 79.9 12/07/2024   VD25OH 81.5 05/25/2024   Taking K2 100 mcg/Vit D3 50 mcg every other day. With reported good compliance and tolerance. Explained to patient that since we are in winter  these labs should be between 40 and 70. No concerns. Recommended that patient decrease supplementation to 2 days a week. Will obtain labs in 3-4 months.     Mood disorder (HCC) - emotional eating Assessment & Plan On Wellbutrin  XL 150 mg daily with reported good compliance and tolerance. Patient states that she has had an increase in cravings and will wake up hungry at 3 am sometimes. She reports going to bed at around 8:30 pm sometimes and she will wake up hungry at 2 am. She states that she is going to bed earlier because it is cold and it's winter and she just gets a little more sad. Explained to patient the importance of moving around with at home youtube videos or finding something she enjoys. Will reassess at next OV.     Metabolic dysfunction-associated steatotic liver disease (MASLD) - new onset Assessment &  Plan    Component Value Date/Time   PROT 7.0 12/07/2024 1441   ALBUMIN 4.7 12/07/2024 1441   AST 26 12/07/2024 1441   ALT 24 12/07/2024 1441   ALKPHOS 93 12/07/2024 1441   BILITOT 0.5 12/07/2024 1441   BILIDIR 0.1 05/12/2017 1222   Patient liver enzymes were previously elevated. Most recent labs show liver enzymes have come down to normal levels. As patient continue to los weight and follow nutritional meal plan it will help continue to lower these enzymes and keep them WNL.    Follow up:   Return 01/26/2025 at 2:00 PM   She was informed of the importance of frequent follow up visits to maximize her success with intensive lifestyle modifications for her multiple health conditions.   Weight Summary and Biometrics   Weight Lost Since Last Visit: 8 lb  Weight Gained Since Last Visit: 0 lb   Vitals Temp: 97.9 F (36.6 C) BP: 100/68 Pulse Rate: 84 SpO2: 97 %   Anthropometric Measurements Height: 5' 7 (1.702 m) Weight: 203 lb (92.1 kg) BMI (Calculated): 31.79 Weight at Last Visit: 211 lb Weight Lost Since Last Visit: 8 lb Weight Gained Since Last Visit: 0  lb Starting Weight: 259 lb Total Weight Loss (lbs): 56 lb (25.4 kg) Peak Weight: 316 lb   Body Composition  Body Fat %: 44.8 % Fat Mass (lbs): 91.2 lbs Muscle Mass (lbs): 106.6 lbs Total Body Water (lbs): 80.8 lbs Visceral Fat Rating : 12   Other Clinical Data Fasting: no Labs: no Today's Visit #: 10 Starting Date: 05/25/24    Objective:   PHYSICAL EXAM: Blood pressure 100/68, pulse 84, temperature 97.9 F (36.6 C), height 5' 7 (1.702 m), weight 203 lb (92.1 kg), SpO2 97%. Body mass index is 31.79 kg/m. General: she is overweight, cooperative and in no acute distress. PSYCH: Has normal mood, affect and thought process.   HEENT: EOMI, sclerae are anicteric. Lungs: Normal breathing effort, no conversational dyspnea. Extremities: Moves * 4 Neurologic: A and O * 3, good insight  DIAGNOSTIC DATA REVIEWED: BMET    Component Value Date/Time   NA 140 12/07/2024 1441   K 3.8 12/07/2024 1441   CL 98 12/07/2024 1441   CO2 25 12/07/2024 1441   GLUCOSE 86 12/07/2024 1441   GLUCOSE 97 05/26/2019 1046   BUN 25 (H) 12/07/2024 1441   CREATININE 1.14 (H) 12/07/2024 1441   CALCIUM 10.2 12/07/2024 1441   Lab Results  Component Value Date   HGBA1C 5.3 12/07/2024   HGBA1C 5.5 05/25/2024   Lab Results  Component Value Date   INSULIN  3.7 12/07/2024   INSULIN  5.4 05/25/2024   Lab Results  Component Value Date   TSH 1.430 05/25/2024   CBC    Component Value Date/Time   WBC 7.0 05/25/2024 1214   WBC 6.9 05/26/2019 1046   RBC 4.39 05/25/2024 1214   RBC 4.42 05/26/2019 1046   HGB 13.4 05/25/2024 1214   HCT 42.3 05/25/2024 1214   PLT 213 05/25/2024 1214   MCV 96 05/25/2024 1214   MCH 30.5 05/25/2024 1214   MCHC 31.7 05/25/2024 1214   MCHC 32.5 05/26/2019 1046   RDW 12.9 05/25/2024 1214   Iron  Studies    Component Value Date/Time   IRON  94 05/25/2024 1214   TIBC 334 05/25/2024 1214   FERRITIN 49 05/25/2024 1214   IRONPCTSAT 28 05/25/2024 1214   Lipid Panel      Component Value Date/Time   CHOL 170 05/25/2024 1214  TRIG 61 05/25/2024 1214   HDL 64 05/25/2024 1214   CHOLHDL 2.7 05/25/2024 1214   LDLCALC 94 05/25/2024 1214   Hepatic Function Panel     Component Value Date/Time   PROT 7.0 12/07/2024 1441   ALBUMIN 4.7 12/07/2024 1441   AST 26 12/07/2024 1441   ALT 24 12/07/2024 1441   ALKPHOS 93 12/07/2024 1441   BILITOT 0.5 12/07/2024 1441   BILIDIR 0.1 05/12/2017 1222      Component Value Date/Time   TSH 1.430 05/25/2024 1214   Nutritional Lab Results  Component Value Date   VD25OH 79.9 12/07/2024   VD25OH 81.5 05/25/2024    Attestations:   Paige [name] Sonny Laroche, acting as a stage manager for Paige Jenkins, DO., have compiled all relevant documentation for today's office visit on behalf of Paige Jenkins, DO, while in the presence of [employer], DO.  I have reviewed the above documentation for accuracy and completeness, and I agree with the above. Paige JINNY [name], D.O.  The 21st Century Cures Act was signed into law in 2016 which includes the topic of electronic health records.  This provides immediate access to information in MyChart.  This includes consultation notes, operative notes, office notes, lab results and pathology reports.  If you have any questions about what you read please let us  know at your next visit so we can discuss your concerns and take corrective action if need be.  We are right here with you.  "

## 2025-01-03 ENCOUNTER — Ambulatory Visit (INDEPENDENT_AMBULATORY_CARE_PROVIDER_SITE_OTHER): Admitting: Family Medicine

## 2025-01-05 ENCOUNTER — Ambulatory Visit: Admitting: Psychology

## 2025-01-05 DIAGNOSIS — F3341 Major depressive disorder, recurrent, in partial remission: Secondary | ICD-10-CM

## 2025-01-05 DIAGNOSIS — F902 Attention-deficit hyperactivity disorder, combined type: Secondary | ICD-10-CM

## 2025-01-05 DIAGNOSIS — F4310 Post-traumatic stress disorder, unspecified: Secondary | ICD-10-CM

## 2025-01-05 NOTE — Progress Notes (Unsigned)
 " [facility] Counselor/Therapist Progress Note  Patient ID: Paige [name], MRN: [id number],    Date: 01/05/2025  Time Spent: 54 minutes  Time in:2:06 Time out:3:02  Treatment Type: Individual Therapy  Reported Symptoms: depression and worry  Mental Status Exam: Appearance:  Casual     Behavior: Appropriate  Motor: Normal  Speech/Language:  Normal Rate  Affect: blunted  Mood: pleasant  Thought process: normal  Thought content:   WNL  Sensory/Perceptual disturbances:   WNL  Orientation: oriented to person, place, time/date, and situation  Attention: Good  Concentration: Good  Memory: WNL  Fund of knowledge:  Good  Insight:   Good  Judgment:  Fair  Impulse Control: Fair   Risk Assessment: Danger to Self:  No Self-injurious Behavior: No Danger to Others: No Duty to Warn:no Physical Aggression / Violence:No  Access to Firearms a concern: No  Gang Involvement:No   Subjective: The patient attended a face-to-face individual therapy session via video visit.  The patient gave verbal consent for this session to be on video on caregility and patient is aware of the limitations of telehealth..  The patient was in her home alone and therapist was in the office alone.  The patient presents as pleasant and cooperative.  The patient reports that she is going to have to have surgery in March on her back.  She seems to be handling the news well and she reports that she and Paige [name] have been doing some scrapbooking over the last few days and that has been going well.  The patient does seem to be handling things pretty well right now and she is not struggling with dissociation anymore at all.  We talked about how she is going to help herself by doing mindfulness and meditation while she is recuperating.  They also will probably get her back into some sort of exercise after she has the surgery.  We scheduled a few appointments send and canceled one as it would fall sometime around  her surgery.  The plan continues to be for her to graduate at the end of March or early April.   Interventions: Cognitive Behavioral Therapy  Diagnosis:Recurrent major depressive disorder, in partial remission  PTSD (post-traumatic stress disorder)  Attention deficit hyperactivity disorder (ADHD), combined type  Plan: Treatment Plan  Strengths/Abilities:  Intelligent, kind, motivated  Treatment Preferences:  Outpatient Individual therapy  Statement of Needs:  I need some help with getting back out into the world and learn how to interact and be social   Symptoms:  Describes a reliving of the event, particularly through dissociative flashbacks.:  (Status: improved). Displays a significant decline in interest and engagement in activities.: (Status: improved). Displays significant psychological and/or physiological  distress resulting from internal and external clues that are reminiscent of the traumatic event.:  (Status: improved). Experiences disturbances in sleep.:   (Status: improved). Experiences disturbing and persistent thoughts, images, and/or perceptions of the  traumatic event.: (Status: improved). Experiences frequent nightmares.:  (Status: improved). Has been exposed to a traumatic event involving actual or  perceived threat of death or serious injury.: (Status: maintained).  Hypervigilance (e.g., feeling constantly on edge, experiencing concentration difficulties, having trouble falling or staying asleep, exhibiting a general state of irritability).: (Status:  improved). Impairment in social, occupational, or other areas of functioning.:  (Status: improved). Intentionally avoids activities, places, people, or objects (e.g., up-armored vehicles) that evoke memories of the event.:  (Status: improved). Intentionally avoids  thoughts, feelings, or discussions related to the traumatic event.: (  Status:  improved). Reports difficulty concentrating as well as feelings of guilt.:    (Status: improved). Reports response of intense fear, helplessness, or horror to the traumatic event.: (Status: improved). Symptoms present more than one month.:  (Status: maintained).    Problems Addressed:  Anxiety, Posttraumatic Stress Disorder (PTSD),  Goals:  LTG:  1. Enhance ability to effectively cope with the full variety of life's worries  and anxieties.  90% 2. No longer avoids persons, places, activities, and objects that are  reminiscent of the traumatic event.  90% 3. No longer experiences intrusive event recollections, avoidance of event  reminders, intense arousal, or disinterest in activities or  relationships.  100% 4. Stabilize anxiety level while increasing ability to function on a daily  basis.  100% 5. Thinks about or openly discusses the traumatic event with others  without experiencing psychological or physiological distress.  80%  STG: 1.Identify and engage in pleasant activities on a daily basis..  100% 2.Identify, challenge, and replace biased, fearful self-talk with positive, realistic, and empowering self talk 3.  Participate in Cognitive Therapy to help identify, challenge, and replace biased, negative, and selfdefeating thoughts resulting from the trauma.  80 % 4.  Participate in Eye Movement Desensitization and Reprocessing (EMDR) to reduce emotional distress  related to traumatic thoughts, feelings, and images.  90%  Target Date:  02/16/2026 Frequency: Every four weeks Modality:Individual Therapy Interventions by Therapist:  CBT, EMDR, insight oriented therapy, problem solving  Patient approved treatment plan and is progressing nicely.  Paige KANDICE Macintosh, LCSW                         [facility] Counselor/Therapist Progress Note  Patient ID: Paige [name], MRN: [id number],    Date: 12/08/2024  Time Spent: 54 minutes  Time in:2:03 Time out:2:57  Treatment Type: Individual Therapy  Reported Symptoms:  depression and worry  Mental Status Exam: Appearance:  Casual     Behavior: Appropriate  Motor: Normal  Speech/Language:  Normal Rate  Affect: blunted  Mood: pleasant  Thought process: normal  Thought content:   WNL  Sensory/Perceptual disturbances:   WNL  Orientation: oriented to person, place, time/date, and situation  Attention: Good  Concentration: Good  Memory: WNL  Fund of knowledge:  Good  Insight:   Good  Judgment:  Fair  Impulse Control: Fair   Risk Assessment: Danger to Self:  No Self-injurious Behavior: No Danger to Others: No Duty to Warn:no Physical Aggression / Violence:No  Access to Firearms a concern: No  Gang Involvement:No   Subjective: The patient attended a face-to-face individual therapy session via video visit.  The patient gave verbal consent for this session to be on video on caregility and patient is aware of the limitations of telehealth..  The patient was in her home alone and therapist was in the office alone.  The patient presents as pleasant and cooperative.  The patient reports that she feels like she is doing relatively well.  She does report that she knows that she is going to have some difficulties when her partner goes to school in a few weeks.  We talked about different ways that she can occupy her time and get back into some of the things she used to like to do.  We discussed the need for her to start thinking about what it is that she is going to do because right now she relies a lot on him to help her feel  good about what she is doing.  We talked about her getting back into some exercise and also she loves to do crafting and we talked about that as well.  She is doing very very well and should be able to graduate in March.  Interventions: Cognitive Behavioral Therapy  Diagnosis:Recurrent major depressive disorder, in partial remission  PTSD (post-traumatic stress disorder)  Attention deficit hyperactivity disorder (ADHD), combined  type  Plan: Treatment Plan  Strengths/Abilities:  Intelligent, kind, motivated  Treatment Preferences:  Outpatient Individual therapy  Statement of Needs:  I need some help with getting back out into the world and learn how to interact and be social   Symptoms:  Describes a reliving of the event, particularly through dissociative flashbacks.:  (Status: improved). Displays a significant decline in interest and engagement in activities.: (Status: improved). Displays significant psychological and/or physiological  distress resulting from internal and external clues that are reminiscent of the traumatic event.:  (Status: improved). Experiences disturbances in sleep.:   (Status: improved). Experiences disturbing and persistent thoughts, images, and/or perceptions of the  traumatic event.: (Status: improved). Experiences frequent nightmares.:  (Status: improved). Has been exposed to a traumatic event involving actual or  perceived threat of death or serious injury.: (Status: maintained).  Hypervigilance (e.g., feeling constantly on edge, experiencing concentration difficulties, having trouble falling or staying asleep, exhibiting a general state of irritability).: (Status:  improved). Impairment in social, occupational, or other areas of functioning.:  (Status: improved). Intentionally avoids activities, places, people, or objects (e.g., up-armored vehicles) that evoke memories of the event.:  (Status: improved). Intentionally avoids  thoughts, feelings, or discussions related to the traumatic event.: (Status:  improved). Reports difficulty concentrating as well as feelings of guilt.:   (Status: improved). Reports response of intense fear, helplessness, or horror to the traumatic event.: (Status: improved). Symptoms present more than one month.:  (Status: maintained).    Problems Addressed:  Anxiety, Posttraumatic Stress Disorder (PTSD),  Goals:  LTG:  1. Enhance ability to effectively cope  with the full variety of life's worries  and anxieties.  90% 2. No longer avoids persons, places, activities, and objects that are  reminiscent of the traumatic event.  90% 3. No longer experiences intrusive event recollections, avoidance of event  reminders, intense arousal, or disinterest in activities or  relationships.  100% 4. Stabilize anxiety level while increasing ability to function on a daily  basis.  90% 5. Thinks about or openly discusses the traumatic event with others  without experiencing psychological or physiological distress.  80%  STG: 1.Identify and engage in pleasant activities on a daily basis..  90% 2.Identify, challenge, and replace biased, fearful self-talk with positive, realistic, and empowering self talk 3.  Participate in Cognitive Therapy to help identify, challenge, and replace biased, negative, and selfdefeating thoughts resulting from the trauma.  80 % 4.  Participate in Eye Movement Desensitization and Reprocessing (EMDR) to reduce emotional distress  related to traumatic thoughts, feelings, and images.  90%  Target Date:  02/16/2025 Frequency: Every four weeks Modality:Individual Therapy Interventions by Therapist:  CBT, EMDR, insight oriented therapy, problem solving  Patient approved treatment plan and is progressing nicely.  Kazmir Oki G Reakwon Barren, LCSW                                                                                                  "

## 2025-01-11 ENCOUNTER — Encounter: Payer: Self-pay | Admitting: Physical Therapy

## 2025-01-14 ENCOUNTER — Telehealth: Admitting: Adult Health

## 2025-01-18 ENCOUNTER — Ambulatory Visit: Admitting: Obstetrics and Gynecology

## 2025-01-25 ENCOUNTER — Ambulatory Visit: Admitting: Psychology

## 2025-01-26 ENCOUNTER — Ambulatory Visit (INDEPENDENT_AMBULATORY_CARE_PROVIDER_SITE_OTHER): Admitting: Family Medicine

## 2025-01-31 ENCOUNTER — Ambulatory Visit (INDEPENDENT_AMBULATORY_CARE_PROVIDER_SITE_OTHER): Admitting: Family Medicine

## 2025-02-01 ENCOUNTER — Ambulatory Visit (HOSPITAL_BASED_OUTPATIENT_CLINIC_OR_DEPARTMENT_OTHER)

## 2025-02-02 ENCOUNTER — Ambulatory Visit: Admitting: Psychology

## 2025-02-08 ENCOUNTER — Telehealth: Admitting: Adult Health

## 2025-02-17 ENCOUNTER — Ambulatory Visit

## 2025-02-23 ENCOUNTER — Ambulatory Visit: Admitting: Psychology

## 2025-03-02 ENCOUNTER — Ambulatory Visit: Admitting: Psychology

## 2025-03-07 ENCOUNTER — Ambulatory Visit (INDEPENDENT_AMBULATORY_CARE_PROVIDER_SITE_OTHER): Admitting: Family Medicine

## 2025-09-13 ENCOUNTER — Ambulatory Visit: Admitting: Neurology
